# Patient Record
Sex: Male | Born: 1945 | Race: White | Hispanic: No | Smoking: Current every day smoker
Health system: Southern US, Community
[De-identification: ages and names within clinical notes are randomized; demographics above are authoritative.]

## PROBLEM LIST (undated history)

## (undated) DIAGNOSIS — J449 Chronic obstructive pulmonary disease, unspecified: Secondary | ICD-10-CM

## (undated) DIAGNOSIS — R55 Syncope and collapse: Secondary | ICD-10-CM

## (undated) DIAGNOSIS — I209 Angina pectoris, unspecified: Secondary | ICD-10-CM

## (undated) DIAGNOSIS — F4322 Adjustment disorder with anxiety: Secondary | ICD-10-CM

## (undated) DIAGNOSIS — G8929 Other chronic pain: Secondary | ICD-10-CM

## (undated) DIAGNOSIS — I5022 Chronic systolic (congestive) heart failure: Secondary | ICD-10-CM

## (undated) DIAGNOSIS — I739 Peripheral vascular disease, unspecified: Secondary | ICD-10-CM

## (undated) DIAGNOSIS — IMO0001 Reserved for inherently not codable concepts without codable children: Secondary | ICD-10-CM

## (undated) DIAGNOSIS — M1990 Unspecified osteoarthritis, unspecified site: Secondary | ICD-10-CM

## (undated) DIAGNOSIS — N400 Enlarged prostate without lower urinary tract symptoms: Secondary | ICD-10-CM

## (undated) DIAGNOSIS — I480 Paroxysmal atrial fibrillation: Secondary | ICD-10-CM

## (undated) DIAGNOSIS — C61 Malignant neoplasm of prostate: Secondary | ICD-10-CM

## (undated) DIAGNOSIS — N183 Chronic kidney disease, stage 3 unspecified: Secondary | ICD-10-CM

## (undated) DIAGNOSIS — F419 Anxiety disorder, unspecified: Secondary | ICD-10-CM

## (undated) DIAGNOSIS — I2510 Atherosclerotic heart disease of native coronary artery without angina pectoris: Secondary | ICD-10-CM

## (undated) DIAGNOSIS — R918 Other nonspecific abnormal finding of lung field: Secondary | ICD-10-CM

## (undated) DIAGNOSIS — I959 Hypotension, unspecified: Secondary | ICD-10-CM

## (undated) DIAGNOSIS — I10 Essential (primary) hypertension: Secondary | ICD-10-CM

## (undated) DIAGNOSIS — Z720 Tobacco use: Secondary | ICD-10-CM

## (undated) DIAGNOSIS — E785 Hyperlipidemia, unspecified: Secondary | ICD-10-CM

## (undated) DIAGNOSIS — C649 Malignant neoplasm of unspecified kidney, except renal pelvis: Secondary | ICD-10-CM

## (undated) DIAGNOSIS — I255 Ischemic cardiomyopathy: Secondary | ICD-10-CM

## (undated) DIAGNOSIS — Z8489 Family history of other specified conditions: Secondary | ICD-10-CM

## (undated) DIAGNOSIS — I00 Rheumatic fever without heart involvement: Secondary | ICD-10-CM

## (undated) HISTORY — DX: Essential (primary) hypertension: I10

## (undated) HISTORY — DX: Adjustment disorder with anxiety: F43.22

## (undated) HISTORY — PX: NEPHRECTOMY: SHX65

## (undated) HISTORY — PX: ROBOT ASSISTED LAPAROSCOPIC RADICAL PROSTATECTOMY: SHX5141

## (undated) HISTORY — PX: CORONARY ARTERY BYPASS GRAFT: SHX141

## (undated) HISTORY — DX: Hyperlipidemia, unspecified: E78.5

## (undated) HISTORY — PX: POSTERIOR CERVICAL LAMINECTOMY: SHX2248

## (undated) HISTORY — PX: OTHER SURGICAL HISTORY: SHX169

## (undated) HISTORY — PX: CARDIAC CATHETERIZATION: SHX172

## (undated) HISTORY — DX: Anxiety disorder, unspecified: F41.9

## (undated) HISTORY — DX: Chronic obstructive pulmonary disease, unspecified: J44.9

## (undated) HISTORY — DX: Atherosclerotic heart disease of native coronary artery without angina pectoris: I25.10

## (undated) HISTORY — DX: Benign prostatic hyperplasia without lower urinary tract symptoms: N40.0

---

## 1992-07-12 HISTORY — PX: KIDNEY SURGERY: SHX687

## 1997-12-21 ENCOUNTER — Inpatient Hospital Stay (HOSPITAL_COMMUNITY): Admission: EM | Admit: 1997-12-21 | Discharge: 1997-12-24 | Payer: Self-pay | Admitting: Emergency Medicine

## 1998-04-03 ENCOUNTER — Inpatient Hospital Stay (HOSPITAL_COMMUNITY): Admission: EM | Admit: 1998-04-03 | Discharge: 1998-04-05 | Payer: Self-pay | Admitting: Emergency Medicine

## 1998-04-15 ENCOUNTER — Inpatient Hospital Stay (HOSPITAL_COMMUNITY): Admission: EM | Admit: 1998-04-15 | Discharge: 1998-04-16 | Payer: Self-pay | Admitting: Emergency Medicine

## 1998-05-04 ENCOUNTER — Encounter: Payer: Self-pay | Admitting: Emergency Medicine

## 1998-05-04 ENCOUNTER — Inpatient Hospital Stay (HOSPITAL_COMMUNITY): Admission: EM | Admit: 1998-05-04 | Discharge: 1998-05-06 | Payer: Self-pay | Admitting: Emergency Medicine

## 1998-05-12 ENCOUNTER — Inpatient Hospital Stay (HOSPITAL_COMMUNITY): Admission: EM | Admit: 1998-05-12 | Discharge: 1998-05-14 | Payer: Self-pay | Admitting: Emergency Medicine

## 1998-09-19 ENCOUNTER — Encounter: Payer: Self-pay | Admitting: Emergency Medicine

## 1998-09-19 ENCOUNTER — Emergency Department (HOSPITAL_COMMUNITY): Admission: EM | Admit: 1998-09-19 | Discharge: 1998-09-19 | Payer: Self-pay | Admitting: Emergency Medicine

## 1999-02-24 ENCOUNTER — Encounter: Payer: Self-pay | Admitting: Neurosurgery

## 1999-02-26 ENCOUNTER — Inpatient Hospital Stay (HOSPITAL_COMMUNITY): Admission: RE | Admit: 1999-02-26 | Discharge: 1999-02-27 | Payer: Self-pay | Admitting: Neurosurgery

## 1999-02-26 ENCOUNTER — Encounter: Payer: Self-pay | Admitting: Neurosurgery

## 1999-04-29 ENCOUNTER — Encounter: Admission: RE | Admit: 1999-04-29 | Discharge: 1999-07-28 | Payer: Self-pay | Admitting: Neurosurgery

## 1999-06-26 ENCOUNTER — Encounter: Payer: Self-pay | Admitting: Neurosurgery

## 1999-06-26 ENCOUNTER — Encounter: Admission: RE | Admit: 1999-06-26 | Discharge: 1999-06-26 | Payer: Self-pay | Admitting: Neurosurgery

## 1999-08-17 ENCOUNTER — Encounter: Payer: Self-pay | Admitting: Neurosurgery

## 1999-08-17 ENCOUNTER — Ambulatory Visit (HOSPITAL_COMMUNITY): Admission: RE | Admit: 1999-08-17 | Discharge: 1999-08-17 | Payer: Self-pay | Admitting: Neurosurgery

## 1999-12-22 ENCOUNTER — Encounter: Payer: Self-pay | Admitting: Neurosurgery

## 1999-12-22 ENCOUNTER — Inpatient Hospital Stay (HOSPITAL_COMMUNITY): Admission: RE | Admit: 1999-12-22 | Discharge: 1999-12-23 | Payer: Self-pay | Admitting: Neurosurgery

## 2000-01-11 ENCOUNTER — Encounter: Payer: Self-pay | Admitting: Neurosurgery

## 2000-01-11 ENCOUNTER — Encounter: Admission: RE | Admit: 2000-01-11 | Discharge: 2000-01-11 | Payer: Self-pay | Admitting: Neurosurgery

## 2000-01-20 ENCOUNTER — Encounter: Payer: Self-pay | Admitting: Neurosurgery

## 2000-01-20 ENCOUNTER — Encounter: Admission: RE | Admit: 2000-01-20 | Discharge: 2000-01-20 | Payer: Self-pay | Admitting: Neurosurgery

## 2000-02-19 ENCOUNTER — Encounter: Admission: RE | Admit: 2000-02-19 | Discharge: 2000-02-19 | Payer: Self-pay | Admitting: Neurosurgery

## 2000-02-19 ENCOUNTER — Encounter: Payer: Self-pay | Admitting: Neurosurgery

## 2000-03-04 ENCOUNTER — Other Ambulatory Visit: Admission: RE | Admit: 2000-03-04 | Discharge: 2000-03-04 | Payer: Self-pay | Admitting: Orthopedic Surgery

## 2001-03-01 ENCOUNTER — Encounter: Payer: Self-pay | Admitting: Emergency Medicine

## 2001-03-01 ENCOUNTER — Inpatient Hospital Stay (HOSPITAL_COMMUNITY): Admission: EM | Admit: 2001-03-01 | Discharge: 2001-03-03 | Payer: Self-pay | Admitting: Emergency Medicine

## 2001-03-08 ENCOUNTER — Encounter: Admission: RE | Admit: 2001-03-08 | Discharge: 2001-03-11 | Payer: Self-pay | Admitting: Anesthesiology

## 2001-07-05 ENCOUNTER — Emergency Department (HOSPITAL_COMMUNITY): Admission: EM | Admit: 2001-07-05 | Discharge: 2001-07-05 | Payer: Self-pay | Admitting: Emergency Medicine

## 2003-03-29 ENCOUNTER — Encounter: Payer: Self-pay | Admitting: Emergency Medicine

## 2003-03-29 ENCOUNTER — Emergency Department (HOSPITAL_COMMUNITY): Admission: EM | Admit: 2003-03-29 | Discharge: 2003-03-29 | Payer: Self-pay | Admitting: Emergency Medicine

## 2005-01-06 ENCOUNTER — Inpatient Hospital Stay (HOSPITAL_COMMUNITY): Admission: EM | Admit: 2005-01-06 | Discharge: 2005-01-09 | Payer: Self-pay | Admitting: Emergency Medicine

## 2005-01-07 ENCOUNTER — Ambulatory Visit: Payer: Self-pay | Admitting: Cardiology

## 2005-01-07 ENCOUNTER — Encounter: Payer: Self-pay | Admitting: Cardiology

## 2005-01-10 ENCOUNTER — Emergency Department (HOSPITAL_COMMUNITY): Admission: EM | Admit: 2005-01-10 | Discharge: 2005-01-10 | Payer: Self-pay | Admitting: Emergency Medicine

## 2005-01-20 ENCOUNTER — Ambulatory Visit: Payer: Self-pay | Admitting: Cardiology

## 2007-02-24 ENCOUNTER — Emergency Department (HOSPITAL_COMMUNITY): Admission: EM | Admit: 2007-02-24 | Discharge: 2007-02-24 | Payer: Self-pay | Admitting: Emergency Medicine

## 2007-03-17 ENCOUNTER — Ambulatory Visit: Payer: Self-pay | Admitting: Cardiovascular Disease

## 2007-03-17 LAB — CONVERTED CEMR LAB
BUN: 17 mg/dL (ref 6–23)
Basophils Absolute: 0.1 10*3/uL (ref 0.0–0.1)
Basophils Relative: 0.5 % (ref 0.0–1.0)
CO2: 26 meq/L (ref 19–32)
Calcium: 9.4 mg/dL (ref 8.4–10.5)
Chloride: 104 meq/L (ref 96–112)
Eosinophils Absolute: 0.2 10*3/uL (ref 0.0–0.6)
GFR calc non Af Amer: 72 mL/min
Glucose, Bld: 104 mg/dL — ABNORMAL HIGH (ref 70–99)
Hemoglobin: 13.6 g/dL (ref 13.0–17.0)
MCV: 87.6 fL (ref 78.0–100.0)
Monocytes Relative: 7.6 % (ref 3.0–11.0)
Neutrophils Relative %: 68.9 % (ref 43.0–77.0)
Potassium: 3.6 meq/L (ref 3.5–5.1)
RDW: 13.5 % (ref 11.5–14.6)
Sodium: 139 meq/L (ref 135–145)
aPTT: 30.1 s — ABNORMAL HIGH (ref 21.7–29.8)

## 2007-03-23 ENCOUNTER — Inpatient Hospital Stay (HOSPITAL_BASED_OUTPATIENT_CLINIC_OR_DEPARTMENT_OTHER): Admission: RE | Admit: 2007-03-23 | Discharge: 2007-03-23 | Payer: Self-pay | Admitting: Cardiovascular Disease

## 2007-03-23 ENCOUNTER — Ambulatory Visit: Payer: Self-pay | Admitting: Cardiovascular Disease

## 2007-06-27 ENCOUNTER — Ambulatory Visit: Payer: Self-pay | Admitting: Cardiovascular Disease

## 2008-03-26 ENCOUNTER — Ambulatory Visit: Payer: Self-pay | Admitting: Cardiovascular Disease

## 2008-09-23 DIAGNOSIS — C61 Malignant neoplasm of prostate: Secondary | ICD-10-CM

## 2008-09-23 DIAGNOSIS — F17200 Nicotine dependence, unspecified, uncomplicated: Secondary | ICD-10-CM | POA: Insufficient documentation

## 2008-09-23 DIAGNOSIS — I10 Essential (primary) hypertension: Secondary | ICD-10-CM | POA: Insufficient documentation

## 2008-11-04 ENCOUNTER — Ambulatory Visit: Payer: Self-pay | Admitting: Cardiovascular Disease

## 2008-11-04 DIAGNOSIS — F4322 Adjustment disorder with anxiety: Secondary | ICD-10-CM

## 2008-11-11 ENCOUNTER — Telehealth (INDEPENDENT_AMBULATORY_CARE_PROVIDER_SITE_OTHER): Payer: Self-pay | Admitting: Radiology

## 2008-12-12 ENCOUNTER — Telehealth (INDEPENDENT_AMBULATORY_CARE_PROVIDER_SITE_OTHER): Payer: Self-pay

## 2008-12-16 ENCOUNTER — Encounter: Payer: Self-pay | Admitting: Cardiovascular Disease

## 2008-12-16 ENCOUNTER — Ambulatory Visit: Payer: Self-pay

## 2009-02-11 ENCOUNTER — Encounter: Admission: RE | Admit: 2009-02-11 | Discharge: 2009-02-11 | Payer: Self-pay | Admitting: Internal Medicine

## 2009-05-06 ENCOUNTER — Ambulatory Visit: Payer: Self-pay | Admitting: Cardiovascular Disease

## 2009-05-27 ENCOUNTER — Telehealth (INDEPENDENT_AMBULATORY_CARE_PROVIDER_SITE_OTHER): Payer: Self-pay | Admitting: Physician Assistant

## 2009-05-28 ENCOUNTER — Ambulatory Visit: Payer: Self-pay | Admitting: Internal Medicine

## 2009-05-28 ENCOUNTER — Inpatient Hospital Stay (HOSPITAL_COMMUNITY): Admission: EM | Admit: 2009-05-28 | Discharge: 2009-05-29 | Payer: Self-pay | Admitting: Emergency Medicine

## 2009-06-16 ENCOUNTER — Emergency Department (HOSPITAL_BASED_OUTPATIENT_CLINIC_OR_DEPARTMENT_OTHER): Admission: EM | Admit: 2009-06-16 | Discharge: 2009-06-16 | Payer: Self-pay | Admitting: Emergency Medicine

## 2009-06-26 ENCOUNTER — Encounter: Admission: RE | Admit: 2009-06-26 | Discharge: 2009-06-26 | Payer: Self-pay | Admitting: Internal Medicine

## 2009-09-11 ENCOUNTER — Encounter (INDEPENDENT_AMBULATORY_CARE_PROVIDER_SITE_OTHER): Payer: Self-pay | Admitting: *Deleted

## 2009-11-14 ENCOUNTER — Emergency Department (HOSPITAL_COMMUNITY): Admission: EM | Admit: 2009-11-14 | Discharge: 2009-11-14 | Payer: Self-pay | Admitting: Emergency Medicine

## 2009-11-18 ENCOUNTER — Telehealth: Payer: Self-pay | Admitting: Cardiovascular Disease

## 2009-11-18 ENCOUNTER — Emergency Department (HOSPITAL_COMMUNITY): Admission: EM | Admit: 2009-11-18 | Discharge: 2009-11-18 | Payer: Self-pay | Admitting: Emergency Medicine

## 2009-11-22 ENCOUNTER — Inpatient Hospital Stay (HOSPITAL_COMMUNITY): Admission: EM | Admit: 2009-11-22 | Discharge: 2009-11-23 | Payer: Self-pay | Admitting: Emergency Medicine

## 2009-11-22 ENCOUNTER — Ambulatory Visit: Payer: Self-pay | Admitting: Cardiology

## 2009-11-24 ENCOUNTER — Emergency Department (HOSPITAL_COMMUNITY): Admission: EM | Admit: 2009-11-24 | Discharge: 2009-11-24 | Payer: Self-pay | Admitting: Emergency Medicine

## 2009-12-03 ENCOUNTER — Ambulatory Visit: Payer: Self-pay | Admitting: Cardiovascular Disease

## 2009-12-03 ENCOUNTER — Encounter (INDEPENDENT_AMBULATORY_CARE_PROVIDER_SITE_OTHER): Payer: Self-pay | Admitting: *Deleted

## 2009-12-03 LAB — CONVERTED CEMR LAB
Basophils Absolute: 0 10*3/uL (ref 0.0–0.1)
Basophils Relative: 0.5 % (ref 0.0–3.0)
Eosinophils Absolute: 0.2 10*3/uL (ref 0.0–0.7)
Eosinophils Relative: 2.7 % (ref 0.0–5.0)
GFR calc non Af Amer: 62.28 mL/min (ref >60)
Lymphocytes Relative: 17.6 % (ref 12.0–46.0)
Lymphs Abs: 1.4 10*3/uL (ref 0.7–4.0)
MCHC: 34.3 g/dL (ref 30.0–36.0)
Monocytes Absolute: 0.6 10*3/uL (ref 0.1–1.0)
Monocytes Relative: 7.9 % (ref 3.0–12.0)
Neutro Abs: 5.7 10*3/uL (ref 1.4–7.7)
Platelets: 168 10*3/uL (ref 150.0–400.0)
RBC: 4.83 M/uL (ref 4.22–5.81)
Sodium: 141 meq/L (ref 135–145)
aPTT: 26.1 s (ref 21.7–28.8)

## 2009-12-05 ENCOUNTER — Ambulatory Visit: Payer: Self-pay | Admitting: Internal Medicine

## 2009-12-05 ENCOUNTER — Inpatient Hospital Stay (HOSPITAL_BASED_OUTPATIENT_CLINIC_OR_DEPARTMENT_OTHER): Admission: RE | Admit: 2009-12-05 | Discharge: 2009-12-05 | Payer: Self-pay | Admitting: Internal Medicine

## 2009-12-05 ENCOUNTER — Encounter: Payer: Self-pay | Admitting: Cardiovascular Disease

## 2009-12-13 ENCOUNTER — Emergency Department (HOSPITAL_COMMUNITY): Admission: EM | Admit: 2009-12-13 | Discharge: 2009-12-13 | Payer: Self-pay | Admitting: Emergency Medicine

## 2009-12-14 ENCOUNTER — Emergency Department (HOSPITAL_COMMUNITY): Admission: EM | Admit: 2009-12-14 | Discharge: 2009-12-14 | Payer: Self-pay | Admitting: Emergency Medicine

## 2009-12-15 ENCOUNTER — Emergency Department (HOSPITAL_COMMUNITY): Admission: EM | Admit: 2009-12-15 | Discharge: 2009-12-15 | Payer: Self-pay | Admitting: Emergency Medicine

## 2009-12-16 ENCOUNTER — Encounter: Payer: Self-pay | Admitting: Cardiovascular Disease

## 2009-12-31 ENCOUNTER — Encounter (INDEPENDENT_AMBULATORY_CARE_PROVIDER_SITE_OTHER): Payer: Self-pay | Admitting: *Deleted

## 2009-12-31 ENCOUNTER — Emergency Department (HOSPITAL_COMMUNITY): Admission: EM | Admit: 2009-12-31 | Discharge: 2009-12-31 | Payer: Self-pay | Admitting: Emergency Medicine

## 2010-01-13 ENCOUNTER — Encounter: Payer: Self-pay | Admitting: Cardiovascular Disease

## 2010-01-27 ENCOUNTER — Encounter: Payer: Self-pay | Admitting: Cardiovascular Disease

## 2010-01-28 ENCOUNTER — Telehealth: Payer: Self-pay | Admitting: Cardiovascular Disease

## 2010-02-12 ENCOUNTER — Ambulatory Visit (HOSPITAL_COMMUNITY): Admission: RE | Admit: 2010-02-12 | Discharge: 2010-02-12 | Payer: Self-pay | Admitting: Urology

## 2010-02-23 ENCOUNTER — Inpatient Hospital Stay (HOSPITAL_COMMUNITY): Admission: RE | Admit: 2010-02-23 | Discharge: 2010-02-25 | Payer: Self-pay | Admitting: Urology

## 2010-02-23 ENCOUNTER — Ambulatory Visit: Payer: Self-pay | Admitting: Cardiovascular Disease

## 2010-02-23 ENCOUNTER — Encounter: Payer: Self-pay | Admitting: Cardiovascular Disease

## 2010-02-23 ENCOUNTER — Encounter (INDEPENDENT_AMBULATORY_CARE_PROVIDER_SITE_OTHER): Payer: Self-pay | Admitting: Urology

## 2010-02-25 ENCOUNTER — Encounter: Payer: Self-pay | Admitting: Cardiovascular Disease

## 2010-03-03 ENCOUNTER — Encounter: Payer: Self-pay | Admitting: Cardiovascular Disease

## 2010-08-02 ENCOUNTER — Encounter: Payer: Self-pay | Admitting: Internal Medicine

## 2010-08-11 NOTE — Letter (Signed)
Summary: [facility] Office Note   [facility] Office Note   Imported By: Roderic Ovens 01/23/2010 12:57:39  _____________________________________________________________________  External Attachment:    Type:   Image     Comment:   External Document

## 2010-08-11 NOTE — Letter (Signed)
Summary: [facility] Office Visit Note   [facility] Office Visit Note   Imported By: Roderic Ovens 03/31/2010 15:55:43  _____________________________________________________________________  External Attachment:    Type:   Image     Comment:   External Document

## 2010-08-11 NOTE — Progress Notes (Signed)
Summary: chestpain , pressure  Phone Note Call from Patient Call back at Home Phone [phone]   Caller: Patient Reason for Call: Talk to Nurse Complaint: Chest Pain Details for Reason: c/o chestpain. pressure on chest. offer tomorrow appt at 4 p.m. pt refused. Initial call taken by: Lorne Skeens,  Nov 18, 2009 11:53 AM  Follow-up for Phone Call        spoke with pt, he states he developed chest pain on thursday while trying to have a bowel movement. he was straining very hard he says and his chest started hurting and he broke out in a sweat. he is still having trouble with constipation and has a call into dr tannenbaum. he cont to have a heaviness in his chest that occurs when he strains or with exerction. he is not able to reproduce the pain with movement or touch. he also c/o SOB with exerction. he states this heaviness in his chest is simular to what he had with his previous heart attack.pt also reports legs being weak and just not feeling well. pt referred [facility]. pt voiced understanding.   Follow-up by: Deliah Goody, RN,  Nov 18, 2009 12:22 PM

## 2010-08-11 NOTE — Assessment & Plan Note (Signed)
Summary: rov/ gd   Visit Type:  Follow-up Primary Provider:  DR Hayden Rasmussen  CC:  chest pain.  History of Present Illness: Lucas [name] is seen today in followup.  He has a history coronary bypass surgery.  Catheter in 2008 showed occluded vein graft to the first acute marginal branch and diagonal branch as well as the right.  The vein graft to OM2 and LIMA were patent.  He has collaterals to the distal right.  He continues to smoke too much. He has clinical  COPD but does no want to have an inhaler. He was recently hospitalized for prostate problems and required foley catherter.  He had some SSCP and R/O.  Given his smoking and known graft failure and likely need for prostate surgery I think he needs a diagnostic cath to risk stratify him  Lucas [name] has an anxiety disorder and the inability to urinate really gets him going.  His foley may be D/C next Wendsday so I would like him to have his cath before then.  The risks are well known to Lucas [name] and he is willing to proceed  He will need Versed on the table for sedation.  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 1 Tab Daily 2)  Lisinopril 20 Mg Tabs (Lisinopril) .... Take One Tablet By Mouth Daily 3)  Alprazolam 1 Mg Tabs (Alprazolam) .Marland Kitchen.. 1 Tab Three Times Day 4)  Hydrocodone-Acetaminophen 10-325 Mg Tabs (Hydrocodone-Acetaminophen) .... As Needed 5)  Nitroglycerin 0.4 Mg Subl (Nitroglycerin) .... One Tablet Under Tongue Every 5 Minutes As Needed For Chest Pain---May Repeat Times Three  Allergies (verified): No Known Drug Allergies  Past History:  Past Medical History: Last updated: 10/28/2008 CAD/CABG:  last cath 03/2007 SVG OM2 and Lima patent with collaterilized RCA  Medical therapy  Hypertension Anxiety kidney cancer hx of prostrate ca  Past Surgical History: Last updated: 10/28/2008 previous coronary artery bypass surgery.  prostatic  hypertrophy  heart cath 9.11.08  Family History: Last updated: 09/23/2008 Noncontributory.  Social History: Last updated: 11/04/2008 Married  wife now in nursing home [name] Tobacco Use - Yes.  Alcohol Use - no Drug Use - no  Review of Systems       Denies fever, malais, weight loss, blurry vision, decreased visual acuity, cough, sputum, SOB, hemoptysis, pleuritic pain, palpitaitons, heartburn, abdominal pain, melena, lower extremity edema, claudication, or rash.   Vital Signs:  Patient profile:   65 year old male Height:      70 inches Weight:      193 pounds BMI:     27.79 Pulse rate:   60 / minute BP sitting:   136 / 73  (left arm)  Vitals Entered By: Kem Parkinson (Dec 03, 2009 10:17 AM)  Physical Exam  General:  Affect appropriate Healthy:  appears stated age HEENT: normal Neck supple with no adenopathy JVP normal no bruits no thyromegaly Lungs clear with no wheezing and good diaphragmatic motion Heart:  S1/S2 no murmur,rub, gallop or click PMI normal Abdomen: benighn, BS positve, no tenderness, no AAA no bruit.  No HSM or HJR Distal pulses intact with no bruits No edema Neuro non-focal Skin warm and dry    Impression & Recommendations:  Problem # 1:  CORONARY ARTERY DISEASE (ICD-414.00)  His updated  medication list for this problem includes:    Lisinopril 20 Mg Tabs (Lisinopril) .Marland Kitchen... Take one tablet by mouth daily    Nitroglycerin 0.4 Mg Subl (Nitroglycerin) ..... One tablet under tongue every 5 minutes as needed for chest pain---may repeat times three  Orders: EKG w/ Interpretation (93000) TLB-BMP (Basic Metabolic Panel-BMET) (80048-METABOL) TLB-CBC Platelet - w/Differential (85025-CBCD) TLB-PTT (85730-PTTL) TLB-PT (Protime) (85610-PTP)  Problem # 2:  HYPERTENSION  (ICD-401.9)  Well controlled  His updated medication list for this problem includes:    Cardura 8 Mg Tabs (Doxazosin mesylate) .Marland Kitchen... 1 tab daily    Lisinopril 20 Mg Tabs (Lisinopril) .Marland Kitchen... Take one tablet by mouth daily  Orders: EKG w/ Interpretation (93000)  His updated medication list for this problem includes:    Cardura 8 Mg Tabs (Doxazosin mesylate) .Marland Kitchen... 1 tab daily    Lisinopril 20 Mg Tabs (Lisinopril) .Marland Kitchen... Take one tablet by mouth daily  Problem # 3:  SMOKER (ICD-305.1) Indicates he has stopped for the last 3 months.  Long term risks of vascular disease and further graft failure discussed  Patient Instructions: 1)  Your physician recommends that you schedule a follow-up appointment after cardiac cath 2)  Your physician recommends that you continue on your current medications as directed. Please refer to the Current Medication list given to you today. 3)  Your physician has requested that you have a cardiac catheterization.  Cardiac catheterization is used to diagnose and/or treat various heart conditions. Doctors may recommend this procedure for a number of different reasons. The most common reason is to evaluate chest pain. Chest pain can be a symptom of coronary artery disease (CAD), and cardiac catheterization can show whether plaque is narrowing or blocking your heart's arteries. This procedure is also used to evaluate the valves, as well as measure the blood flow and oxygen levels in different parts of your heart.  For further information please visit [email or web address]  Please follow instruction sheet, as given.

## 2010-08-11 NOTE — Letter (Signed)
Summary: Appointment - Reminder 2  [facility], Main Office  [address]   [place], [place] [address]   Phone: [phone]  Fax: [phone]     September 11, 2009 MRN: [id number]   Lucas [name] [address] [place], [place]  [address]   Dear [name],  Our records indicate that it is time to schedule a follow-up appointment with Dr. Eden Emms. It is very important that we reach you to schedule this appointment. We look forward to participating in your health care needs. Please contact us at the number listed above at your earliest convenience to schedule your appointment.  If you are unable to make an appointment at this time, give [place] a call so we can update our records.     Sincerely,   Migdalia Dk [facility] Scheduling Team

## 2010-08-11 NOTE — Progress Notes (Signed)
[facility]   Imported By: Erle Crocker 12/25/2009 07:58:50  _____________________________________________________________________  External Attachment:    Type:   Image     Comment:   External Document

## 2010-08-11 NOTE — Letter (Signed)
[facility]   Imported By: Roderic Ovens 03/13/2010 15:58:59  _____________________________________________________________________  External Attachment:    Type:   Image     Comment:   External Document

## 2010-08-11 NOTE — Cardiovascular Report (Signed)
Summary: Pre Cath Orders   Pre Cath Orders   Imported By: Roderic Ovens 12/30/2009 14:56:55  _____________________________________________________________________  External Attachment:    Type:   Image     Comment:   External Document

## 2010-08-11 NOTE — Progress Notes (Signed)
Summary: clearance  Phone Note From Other Clinic   Caller: nurse Celeta Summary of Call: Pt needs prostate cancer surgery on August 4th. is pt ok to have this from a cardiac standpoint? Per Celeta ofc [id number] [phone] fax [phone] Initial call taken by: Edman Circle,  January 28, 2010 10:34 AM  Follow-up for Phone Call        had cath in May 2011, needs TURP, notes from urolgy are scanned into EMR, will send to Dr Eden Emms for review Meredith Staggers, RN  January 28, 2010 10:44 AM   Additional Follow-up for Phone Call Additional follow up Details #1::        Ok for surgery  No revascularization to be done by recent cath.  We should follow in hospital post op Additional Follow-up by: Colon Branch, MD, [facility],  January 30, 2010 1:03 PM    Additional Follow-up for Phone Call Additional follow up Details #2::    I will fax statement of clearance. Follow-up by: Sherri Rad, RN, BSN,  January 30, 2010 1:35 PM   Appended Document: clearance faxed to [facility]- [phone].

## 2010-08-11 NOTE — Letter (Signed)
Summary: Cardiac Catheterization Instructions- [facility]  [facility], Main Office  [address]   [place], [place] [address]   Phone: [phone]  Fax: [phone]     12/03/2009 MRN: [id number]  Lucas [name] [address] [place], [place]  [address]  Dear Lucas [name],   You are scheduled for a Cardiac Catheterization on Firday Dec 05, 2009 with Dr.Bensimhon  Please arrive to the 1st floor of the Heart and Vascular Center at [facility] at _____ on the day of your procedure. Please do not arrive before 6:30 a.m. Call the Heart and Vascular Center at [phone] if you are unable to make your appointmnet. The Code to get into the parking garage under the building is 0010. Take the elevators to the 1st floor. You must have someone to drive you home. Someone must be with you for the first 24 hours after you arrive home. Please wear clothes that are easy to get on and off and wear slip-on shoes. Do not eat or drink after midnight except water with your medications that morning. Bring all your medications and current insurance cards with you.  ___ DO NOT take these medications before your procedure: ________________________________________________________________  ___ Make sure you take your aspirin.  ___ You may take ALL of your medications with water that morning. ________________________________________________________________________________________________________________________________  ___ DO NOT take ANY medications before your procedure.  ___ Pre-med instructions:  ________________________________________________________________________________________________________________________________  The usual length of stay after your procedure is 2 to 3 hours. This can vary.  If you have any questions, please call the office at the number listed above.   Charolotte Capuchin, RN  Appended Document: Cardiac Catheterization Instructions- [facility]  Lab pt should be there at 7:30am for an 8:30 am case.  He is aware of the time.

## 2010-08-11 NOTE — Cardiovascular Report (Signed)
Summary: Pre Cath Orders  Pre Cath Orders   Imported By: Roderic Ovens 12/09/2009 14:20:02  _____________________________________________________________________  External Attachment:    Type:   Image     Comment:   External Document

## 2010-08-11 NOTE — Letter (Signed)
Summary: Appointment - Missed  [facility], Main Office  [address]   [place], [place] [address]   Phone: [phone]  Fax: [phone]     December 31, 2009 MRN: [id number]   Lucas [name] [address] [place], [place]  [address]   Dear Lucas [name],  Our records indicate you missed your appointment on  12/24/2009 with Dr.  Eden Emms. It is very important that we reach you to reschedule this appointment. We look forward to participating in your health care needs. Please contact us at the number listed above at your earliest convenience to reschedule this appointment.     Sincerely,   Migdalia Dk [facility] Scheduling Team

## 2010-08-14 NOTE — Letter (Signed)
Summary: [facility]  [facility]   Imported By: Marylou Mccoy 02/11/2010 08:58:29  _____________________________________________________________________  External Attachment:    Type:   Image     Comment:   External Document

## 2010-08-14 NOTE — Consult Note (Signed)
[facility]   Imported By: Roderic Ovens 03/13/2010 15:55:22  _____________________________________________________________________  External Attachment:    Type:   Image     Comment:   External Document

## 2010-09-24 LAB — ABO/RH: ABO/RH(D): A POS

## 2010-09-24 LAB — BASIC METABOLIC PANEL
CO2: 26 mEq/L (ref 19–32)
Creatinine, Ser: 1.08 mg/dL (ref 0.4–1.5)
GFR calc Af Amer: >60 mL/min (ref >60)

## 2010-09-24 LAB — HEMATOCRIT: HCT: 37.7 % — ABNORMAL LOW (ref 39.0–52.0)

## 2010-09-24 LAB — HEMOGLOBIN AND HEMATOCRIT, BLOOD: HCT: 38.9 % — ABNORMAL LOW (ref 39.0–52.0)

## 2010-09-25 LAB — BASIC METABOLIC PANEL
CO2: 24 mEq/L (ref 19–32)
Calcium: 9.2 mg/dL (ref 8.4–10.5)
Chloride: 109 mEq/L (ref 96–112)
GFR calc non Af Amer: >60 mL/min (ref >60)

## 2010-09-25 LAB — CBC
HCT: 43.9 % (ref 39.0–52.0)
Hemoglobin: 15.2 g/dL (ref 13.0–17.0)
MCH: 31.6 pg (ref 26.0–34.0)
MCHC: 34.6 g/dL (ref 30.0–36.0)
MCV: 91.5 fL (ref 78.0–100.0)
Platelets: 179 10*3/uL (ref 150–400)
WBC: 8.9 10*3/uL (ref 4.0–10.5)

## 2010-09-25 LAB — SURGICAL PCR SCREEN: Staphylococcus aureus: NEGATIVE

## 2010-09-27 LAB — URINE MICROSCOPIC-ADD ON

## 2010-09-27 LAB — POCT I-STAT, CHEM 8
BUN: 19 mg/dL (ref 6–23)
Calcium, Ion: 1.11 mmol/L — ABNORMAL LOW (ref 1.12–1.32)
Glucose, Bld: 100 mg/dL — ABNORMAL HIGH (ref 70–99)
HCT: 42 % (ref 39.0–52.0)

## 2010-09-27 LAB — URINALYSIS, ROUTINE W REFLEX MICROSCOPIC: Bilirubin Urine: NEGATIVE

## 2010-09-27 LAB — URINE CULTURE: Colony Count: >=100000

## 2010-09-28 LAB — URINALYSIS, ROUTINE W REFLEX MICROSCOPIC
Bilirubin Urine: NEGATIVE
Bilirubin Urine: NEGATIVE
Bilirubin Urine: NEGATIVE
Glucose, UA: NEGATIVE mg/dL
Glucose, UA: NEGATIVE mg/dL
Hgb urine dipstick: NEGATIVE
Ketones, ur: NEGATIVE mg/dL
Ketones, ur: NEGATIVE mg/dL
Ketones, ur: NEGATIVE mg/dL
Leukocytes, UA: NEGATIVE
Protein, ur: NEGATIVE mg/dL
Specific Gravity, Urine: 1.017 (ref 1.005–1.030)
Specific Gravity, Urine: 1.02 (ref 1.005–1.030)
Urobilinogen, UA: 0.2 mg/dL (ref 0.0–1.0)
Urobilinogen, UA: 0.2 mg/dL (ref 0.0–1.0)
pH: 6.5 (ref 5.0–8.0)
pH: 6.5 (ref 5.0–8.0)

## 2010-09-28 LAB — URINE CULTURE: Colony Count: NO GROWTH

## 2010-09-28 LAB — LIPID PANEL
Cholesterol: 177 mg/dL (ref 0–200)
Total CHOL/HDL Ratio: 4.9 RATIO

## 2010-09-28 LAB — HEMOGLOBIN A1C
Hgb A1c MFr Bld: 5.9 % — ABNORMAL HIGH (ref <5.7)
Mean Plasma Glucose: 123 mg/dL — ABNORMAL HIGH (ref <117)

## 2010-09-28 LAB — POCT I-STAT, CHEM 8
BUN: 27 mg/dL — ABNORMAL HIGH (ref 6–23)
Chloride: 108 mEq/L (ref 96–112)
Glucose, Bld: 104 mg/dL — ABNORMAL HIGH (ref 70–99)
HCT: 44 % (ref 39.0–52.0)
Potassium: 4 mEq/L (ref 3.5–5.1)
Sodium: 139 mEq/L (ref 135–145)

## 2010-09-28 LAB — CARDIAC PANEL(CRET KIN+CKTOT+MB+TROPI)
CK, MB: 1.6 ng/mL (ref 0.3–4.0)
Troponin I: 0.04 ng/mL (ref 0.00–0.06)

## 2010-09-28 LAB — CBC
Hemoglobin: 14 g/dL (ref 13.0–17.0)
MCHC: 33.5 g/dL (ref 30.0–36.0)
RBC: 4.51 MIL/uL (ref 4.22–5.81)

## 2010-09-28 LAB — TSH: TSH: 1.347 u[IU]/mL (ref 0.350–4.500)

## 2010-09-28 LAB — URINE MICROSCOPIC-ADD ON

## 2010-09-29 LAB — CBC
HCT: 44.5 % (ref 39.0–52.0)
Hemoglobin: 15.2 g/dL (ref 13.0–17.0)
MCHC: 33.6 g/dL (ref 30.0–36.0)
MCV: 93.3 fL (ref 78.0–100.0)
Platelets: 157 10*3/uL (ref 150–400)
RDW: 13.6 % (ref 11.5–15.5)
RDW: 13.9 % (ref 11.5–15.5)
WBC: 7.8 10*3/uL (ref 4.0–10.5)

## 2010-09-29 LAB — URINE CULTURE
Colony Count: NO GROWTH
Colony Count: NO GROWTH
Culture: NO GROWTH

## 2010-09-29 LAB — COMPREHENSIVE METABOLIC PANEL
ALT: 15 U/L (ref 0–53)
AST: 19 U/L (ref 0–37)
Albumin: 4 g/dL (ref 3.5–5.2)
Alkaline Phosphatase: 59 U/L (ref 39–117)
Chloride: 107 mEq/L (ref 96–112)
GFR calc Af Amer: >60 mL/min (ref >60)
Potassium: 3.8 mEq/L (ref 3.5–5.1)
Sodium: 139 mEq/L (ref 135–145)
Total Bilirubin: 0.8 mg/dL (ref 0.3–1.2)
Total Protein: 7.6 g/dL (ref 6.0–8.3)

## 2010-09-29 LAB — POCT CARDIAC MARKERS
CKMB, poc: 1.1 ng/mL (ref 1.0–8.0)
CKMB, poc: <1 ng/mL — ABNORMAL LOW (ref 1.0–8.0)
Myoglobin, poc: 95 ng/mL (ref 12–200)
Myoglobin, poc: 99.5 ng/mL (ref 12–200)
Troponin i, poc: <0.05 ng/mL (ref 0.00–0.09)
Troponin i, poc: <0.05 ng/mL (ref 0.00–0.09)

## 2010-09-29 LAB — DIFFERENTIAL
Basophils Absolute: 0 10*3/uL (ref 0.0–0.1)
Basophils Relative: 0 % (ref 0–1)
Eosinophils Relative: 3 % (ref 0–5)
Monocytes Absolute: 0.7 10*3/uL (ref 0.1–1.0)
Monocytes Relative: 9 % (ref 3–12)

## 2010-09-29 LAB — URINE MICROSCOPIC-ADD ON: Urine-Other: NONE SEEN

## 2010-09-29 LAB — URINALYSIS, ROUTINE W REFLEX MICROSCOPIC
Bilirubin Urine: NEGATIVE
Glucose, UA: NEGATIVE mg/dL
Glucose, UA: NEGATIVE mg/dL
Ketones, ur: NEGATIVE mg/dL
Ketones, ur: NEGATIVE mg/dL
Leukocytes, UA: NEGATIVE
Protein, ur: NEGATIVE mg/dL
Urobilinogen, UA: 1 mg/dL (ref 0.0–1.0)
pH: 5.5 (ref 5.0–8.0)
pH: 7 (ref 5.0–8.0)

## 2010-09-29 LAB — PROTIME-INR: INR: 1.3 (ref 0.00–1.49)

## 2010-09-29 LAB — BASIC METABOLIC PANEL
CO2: 27 mEq/L (ref 19–32)
Calcium: 9.3 mg/dL (ref 8.4–10.5)
Creatinine, Ser: 1.13 mg/dL (ref 0.4–1.5)
Glucose, Bld: 111 mg/dL — ABNORMAL HIGH (ref 70–99)
Sodium: 139 mEq/L (ref 135–145)

## 2010-09-29 LAB — CARDIAC PANEL(CRET KIN+CKTOT+MB+TROPI)
CK, MB: 1.9 ng/mL (ref 0.3–4.0)
Relative Index: 1.8 (ref 0.0–2.5)
Troponin I: 0.05 ng/mL (ref 0.00–0.06)

## 2010-09-29 LAB — APTT: aPTT: 32 s (ref 24–37)

## 2010-09-29 LAB — CK TOTAL AND CKMB (NOT AT ARMC)
CK, MB: 1.8 ng/mL (ref 0.3–4.0)
Relative Index: 1.5 (ref 0.0–2.5)
Total CK: 119 U/L (ref 7–232)

## 2010-09-29 LAB — TROPONIN I

## 2010-10-13 LAB — URINALYSIS, ROUTINE W REFLEX MICROSCOPIC
Bilirubin Urine: NEGATIVE
Glucose, UA: NEGATIVE mg/dL
Hgb urine dipstick: NEGATIVE
Protein, ur: NEGATIVE mg/dL
Urobilinogen, UA: 0.2 mg/dL (ref 0.0–1.0)

## 2010-10-14 LAB — PROTIME-INR: INR: 1.3 (ref 0.00–1.49)

## 2010-10-14 LAB — DIFFERENTIAL
Basophils Absolute: 0 10*3/uL (ref 0.0–0.1)
Eosinophils Relative: 2 % (ref 0–5)
Lymphocytes Relative: 21 % (ref 12–46)
Lymphs Abs: 2.1 10*3/uL (ref 0.7–4.0)
Monocytes Absolute: 0.9 10*3/uL (ref 0.1–1.0)
Monocytes Relative: 9 % (ref 3–12)
Neutro Abs: 6.7 10*3/uL (ref 1.7–7.7)
Neutrophils Relative %: 68 % (ref 43–77)

## 2010-10-14 LAB — POCT CARDIAC MARKERS
CKMB, poc: 1.2 ng/mL (ref 1.0–8.0)
CKMB, poc: <1 ng/mL — ABNORMAL LOW (ref 1.0–8.0)
Myoglobin, poc: 103 ng/mL (ref 12–200)
Myoglobin, poc: 87.7 ng/mL (ref 12–200)
Troponin i, poc: <0.05 ng/mL (ref 0.00–0.09)

## 2010-10-14 LAB — POCT I-STAT, CHEM 8
BUN: 17 mg/dL (ref 6–23)
Chloride: 105 mEq/L (ref 96–112)
Creatinine, Ser: 0.9 mg/dL (ref 0.4–1.5)
Glucose, Bld: 102 mg/dL — ABNORMAL HIGH (ref 70–99)
Hemoglobin: 15.3 g/dL (ref 13.0–17.0)
Potassium: 3.4 mEq/L — ABNORMAL LOW (ref 3.5–5.1)
Sodium: 141 mEq/L (ref 135–145)
TCO2: 24 mmol/L (ref 0–100)

## 2010-10-14 LAB — CBC
HCT: 40.6 % (ref 39.0–52.0)
Hemoglobin: 14.6 g/dL (ref 13.0–17.0)
Platelets: 166 10*3/uL (ref 150–400)
RBC: 4.63 MIL/uL (ref 4.22–5.81)
RDW: 14 % (ref 11.5–15.5)
WBC: 9.9 10*3/uL (ref 4.0–10.5)

## 2010-10-14 LAB — TSH: TSH: 1.203 u[IU]/mL (ref 0.350–4.500)

## 2010-10-14 LAB — CARDIAC PANEL(CRET KIN+CKTOT+MB+TROPI)
CK, MB: 1.3 ng/mL (ref 0.3–4.0)
Relative Index: 0.9 (ref 0.0–2.5)
Total CK: 141 U/L (ref 7–232)
Troponin I: 0.04 ng/mL (ref 0.00–0.06)

## 2010-10-14 LAB — CK TOTAL AND CKMB (NOT AT ARMC): Relative Index: 1 (ref 0.0–2.5)

## 2010-10-14 LAB — APTT: aPTT: 30 seconds (ref 24–37)

## 2010-11-24 NOTE — Cardiovascular Report (Signed)
NAME:  Lucas [name], Lucas [name] NO.:  [id number]   MEDICAL RECORD NO.:  [id number]          PATIENT TYPE:  OIB   LOCATION:  1963                         FACILITY:  [facility]   PHYSICIAN:  Noralyn Pick. Eden Emms, MD, FACCDATE OF BIRTH:  December 31, 1945   DATE OF PROCEDURE:  DATE OF DISCHARGE:                            CARDIAC CATHETERIZATION   PROCEDURE:  Coronary arteriography.   INDICATIONS:  Chest pain, angina, history of coronary artery bypass  surgery.   Cine catheterization was done with 4-French catheters from right femoral  artery.  The saphenous vein bypass grafts were engaged using a Williams  right catheter and a native JR-4.  The internal mammary artery was  cannulated with a LIMA catheter.   The left main coronary artery was diffusely diseased 70-80%.  The left  anterior descending artery was 100% occluded proximally.  The circumflex  coronary artery was 100% occluded distally.  There are three small  obtuse marginal branches.  They were all less than 1.5-mm vessels and  diffusely diseased.  The second obtuse marginal branch had an 80% ostial  lesion, but the vessel really was too small for angioplasty.  The distal  AV groove circumflex was subtotally occluded with bridging collaterals.   The right coronary artery was 100% occluded proximally.  There was a  large conus branch which gave some bridging collaterals, but the distal  right never filled.   The patient had four saphenous vein grafts.  The saphenous vein graft to  the first obtuse marginal branch, first diagonal branch, and right  coronary artery were all 100% occluded.  This was known from previous  catheterization as well.   The saphenous vein graft to the second obtuse marginal branch had a 30%  tubular lesion proximally.  Otherwise, it was free of disease.   The left internal mammary artery was widely patent to the mid to distal  LAD.  The distal LAD had 30-40% multiple discrete lesions.  The LIMA  filled the LAD retrograde as well as well as the first diagonal branch.  It did not fill the circumflex, and there were some septal collaterals  as well as collaterals from the distal LAD to the PDA and distal right  coronary artery.   RAO VENTRICULOGRAPHY:  RAO ventriculography showed apical hypokinesis.  Overall ejection fraction was 50-55%, LV pressure is 125/4, aortic  pressure is 120/71.   IMPRESSION:  The patient's angiogram is fairly unremarkable compared to  his previous catheterization.  The small marginal branches are not  really revascularizable due to their small size and diffuse disease.  The patient will have continued medical therapy.   He tolerated the procedure well.  Note should be made that [name] has a  significant anxiety disorder.  He required approximately 150 mcg of  fentanyl and 5 mg of Versed to get him through the catheterization  without difficulty.      Noralyn Pick. Eden Emms, MD, [facility]  Electronically Signed     PCN/MEDQ  D:  03/23/2007  T:  03/23/2007  Job:  [id number]

## 2010-11-24 NOTE — Assessment & Plan Note (Signed)
[facility]                            CARDIOLOGY OFFICE NOTE   Lucas [name], Lucas [name]                     MRN:          [id number]  DATE:03/17/2007                            DOB:          10-15-1945    Page is a patient of Dr. Barbee Shropshire who is referred for chest pain.   Date of birth 26-Jan-1946.   Argil was a previous patient of mine who I have not seen since 2006.  [place] brother who had what  sounds like an abdominal infection requiring colostomy and prolonged  hospitalization.   The patient has had known coronary artery disease with previous coronary  artery bypass surgery.   His bypass initially was 1998, his last heart catheterization I believe  was by Dr. Antoine Poche in June of 2006.  At the time the LIMA was patent,  the OM2 graft was patent but the OM1 and RCA graft was occluded and  medical therapy was warranted.   Since that time Lorenza  has been somewhat anxious recently.  Apparently, he has been having some prostate problems.  As I recall,  from his previous catheterization, he is prone to urinary retention and  prostatism.  He has been on Cardura for this.  He has had increasing  pain.  He has seen Dr. Patsi Sears who is to schedule a prostate biopsy.  However, Roberth has been very anxious about this.  His initial  procedure, which sounded like an ultrasound probe, was extremely painful  and Ahron does not understand why he could not have been better  sedated or given pain medicine.  This has precipitated some increasing  blood pressure and chest pain.  The chest pain does sound like angina,  it is related to stress and activity, it has been recurrent, it has  occurred over the last 2 weeks in particular.  He has had some relief  with Xanax, but every time he gets excited about what may happen with  his prostate he gets central chest pressure that radiates to his left  arm, there  is no associated diaphoresis or shortness of breath.   His coronary risk factors include hyperlipidemia, hypertension and he  continues to smoke unfortunately.   The patient has not had any rest pain.  He has not had prolonged  episodes of pain, he has not taken nitroglycerin for it.  When he takes  his Xanax or when he is not stressed about his prostate problems, his  pain goes away.   REVIEW OF SYSTEMS:  As indicated, is remarkable for prostatism with need  for recent biopsy.  He is apparently supposed to have a preparation and  take Cipro prior to the procedure.  I told him we would have to cancel  this or postpone it until we figured out what was going on with his  heart.   FAMILY HISTORY:  Noncontributory.   He has no known allergy.   MEDICATIONS:  1. Lisinopril/hydrochlorothiazide 10/12.5 which was just given to him      by Dr. Barbee Shropshire.  2. An aspirin a day.  3. Cardura 8 mg a day.  4. Tenormin 25 a day.  5. Xanax p.r.n.  He is not on Avodart and I am not sure why.   He is happily married.  His wife is with him.  He is retired.  He still  smokes less than 1/2 pack a day, does not drink.  [place].   EXAM:  Remarkable for an elderly white male with a tan and tattoos, in  no distress.  He has a bit of COPD clinically.  Weight is 203,  respiratory rate is 16, blood pressures 150/85, pulse 68 and regular, he  is afebrile.  HEENT:  Normal.  Carotids normal without bruit.  There is no  lymphadenopathy, no thyromegaly, no JVP elevation.  LUNGS:  Clear with good diaphragmatic motion, no active wheezing, S1 S2.  Distant heart sounds.  PMI is normal.  ABDOMEN:  Protuberant.  Bowel sounds positive.  No hepatosplenomegaly,  no hepatojugular reflux, no AAA, no bruits, no tenderness.  Femorals are +3 bilaterally without bruit, PTs are +3.  There is no  lower extremity edema.  NEURO:  Nonfocal.  Skin is  warm and dry.  There is no muscular weakness.   IMPRESSION:  1. History of coronary artery disease back in 1998, known failed 2 out      of 3 vein grafts.  Chest pain precipitated by anxiety.  The patient      will be referred for heart catheterization next week.  He will      continue his current medications.  He knows to take nitroglycerin      for any prolonged episodes of pain.  2. Hypertension.  ACE inhibitor with diuretic just started.  I think      this is a good addition to his Tenormin.  We will follow his blood      pressure.  I suspect he will need to have escalating doses of his      lisinopril.  He will continue his low salt diet.  3. Prostatism.  We will call Dr. Imelda Pillow office and postpone his      procedure.  We will have to be careful about urinary retention      during his heart catheterization, since this was a problem last      time.  It may be worthwhile to add Avodart to his Cardura.  We will      try to get PSA levels from Dr. Imelda Pillow office, but clearly his      heart needs to be clarified before he has any procedures.  4. Anxiety.  He really needs to talk to Dr. Patsi Sears and get a      better flavor for what is going on with his prostate and clearly      needs better sedation and pain control during any procedures.  He      has plenty of Xanax at home to take for this.  5. Smoking.  Patient may be a good candidate for Zyban.  We encouraged      him to stop smoking given his known coronary disease and failed      grafts.  He will see what he can do,      but right now he particularly wants to smoke during his anxiety      about his prostate procedure.     Theron Arista  Phillips Hay, MD, [facility]  Electronically Signed    PCN/MedQ  DD: 03/17/2007  DT: 03/17/2007  Job #: [id number]   cc:   Lynelle Smoke I. Patsi Sears, M.D.

## 2010-11-24 NOTE — Assessment & Plan Note (Signed)
[facility]                            CARDIOLOGY OFFICE NOTE   CREGG, JUTTE                     MRN:          [id number]  DATE:06/27/2007                            DOB:          05-20-1946    Jerrid returns today for followup. He is status post previous CABG with  failed bypass grafts. His last cath was in September of this year.   At that time, the vein graft to the obtuse marginal, high diagonal and  right were 100% occluded. Vein graft to the second obtuse marginal  branch was patent with 30% disease. The LIMA to the LAD was patent and  filled the diagonal branch retrograde. Medical therapy was warranted.  His EF is 50 to 55%. He is not smoking. He is chewing Nicorette gum. He  has not had any significant chest pain, PND or orthopnea. He continues  to have a significant anxiety disorder.   The patient has been compliant with his medications.   REVIEW OF SYSTEMS:  Is remarkable for improved prostatism. However, he  continues to have an elevated PSA and needs further followup with Dr.  Patsi Sears. 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   CURRENT MEDICATIONS:  1. Aspirin a day.  2. Cardura 8 mg a day.  3. Tenormin 25 a day.  4. Xanax.  5. Lisinopril 10 mg a day.  6. He is no longer on Plavix.   PHYSICAL EXAMINATION:  Is remarkable for an anxious white male in no  distress. Weight 210, blood pressure 140/80, pulse 70 and regular.  Respiratory rate is 16, afebrile.  HEENT: Is unremarkable. Carotids are normal without bruits. There is no  lymphadenopathy, thyromegaly or JVP elevation.  LUNGS:  Are clear with good diaphragmatic motion and rhonchi. He has  COPD with mild end-expiratory wheezes.  There is an S1, S2 with normal heart sounds. PMI normal.  ABDOMEN: Is benign. Bowel sounds positive. No hepatosplenomegaly. No  hepatojugular reflux. No tenderness.  Distal pulses are intact with no edema.  NEURO: Is nonfocal. No muscular weakness.  SKIN: Is warm and dry.   IMPRESSION:  1. Coronary disease 3/4 vein grafts failed. Patent vein graft to the      obtuse marginal and Left internal mammary artery.  Good left      ventricular function. No angina. Continue aspirin and beta-blocker.  2. Hypertension, currently well-controlled. Continue current dosages.      Improvement with lisinopril 10 mg and low salt diet.  3. Prostate cancer. Followup with Dr. Patsi Sears. Followup PSA. If he      needs to have open surgery, given his recent cath in September, he      can.  4. Anxiety. One of Jawanza's biggest problems. However, he seems to be      coping with it better. Continue Xanax. Would add an SSRI in the      future.  5. Previous smoking. Continue abstinence. Prescription for Nicorette      gum given at 2 mg chewing tabs. I congratulated Kannan on not  smoking.     Noralyn Pick. Eden Emms, MD, [facility]  Electronically Signed    PCN/MedQ  DD: 06/27/2007  DT: 06/27/2007  Job #: [id number]

## 2010-11-24 NOTE — Assessment & Plan Note (Signed)
[facility]                            CARDIOLOGY OFFICE NOTE   CID, AGENA                     MRN:          [id number]  DATE:03/26/2008                            DOB:          1945/09/22    Lucas [name] returns today followup.  He has previous history of coronary  artery bypass surgery.  His last cath, which was done on March 23, 2007 showed occluded vein grafts to the first obtuse marginal branch and  diagonal branch in right.  He has a patent graft to the second obtuse  marginal branch and LIMA.  He has some collaterals to the distal right  coronary artery.   He is not having chest pain.  He quit smoking for about 3 months, but is  back to less than a pack a day.  We talked about this.  I counseled him  for less than 10 minutes regarding smoking cessation.  He will try to go  back on the nicotine patches at 14 mg.   He has previously had some success quitting with Nicorette gum.  I told  him to avoid the 21 mg patches as this would be too much for him.   He will pick these up next week and begin to try to cut back on his  smoking.   The long-term risk of his remaining vein graft occluding in the risk of  lung cancer were discussed.  The patient already has some significant  emphysema clinically.  Otherwise, he is doing well.  He is not having  chest pain.  He is active.  He has mild exertional dyspnea, which is  unchanged.  He has not had any coughing.   REVIEW OF SYSTEMS:  Otherwise negative.  He has been compliant with his  medications.  He has had a chronically elevated PSA and has urology  followup for this.   He is on:  1. Cardura 8 mg a day.  2. Lisinopril 10 mg a day.  3. Xanax.  4. He had not been taking aspirin on a regular basis and I told him he      really should be doing this.  5. He does not need to be on a beta-blocker despite his coronary      artery disease as his heart rates are usually in the high 50s to     low 60s.   PHYSICAL EXAMINATION:  GENERAL:  Remarkable for a middle-aged white male  who looks older than his stated age.  VITAL SIGNS:  Blood pressure is 130/80, pulse is 58 and regular,  respiratory rate is 14, and afebrile.  HEENT:  Unremarkable.  NECK:  Carotids are normal without bruit.  No lymphadenopathy,  thyromegaly, or JVP elevation.  LUNGS:  Have mild end-expiratory wheezes.  Good diaphragmatic motion.  CARDIAC:  S1 and S2.  Normal heart sounds.  PMI normal.  ABDOMEN:  Benign.  Bowel sounds positive.  No AAA.  No tenderness.  No  bruit.  No hepatosplenomegaly.  No hepatojugular reflux.  No tenderness.  EXTREMITIES:  Distal pulses are intact.  No edema.  NEURO:  Nonfocal.  SKIN:  Warm and dry.  No muscular weakness.   EKG is entirely normal.   IMPRESSION:  1. Coronary artery disease, previous coronary artery bypass graft with      failed vein grafts.  Continue current medical therapy.  Avoid beta-      blockers for time being given resting bradycardia.  2. Hypertension.  Currently, well controlled.  We will continue      current dose lisinopril and low-salt diet.  3. Smoking cessation.  The patient is to start 14 mg Nicorette patch.      I told him after about 6 weeks to try to cut it back to 7 mg once      he is down to less than 10 cigarettes a day.  4. History of prostate cancer with elevated PSA.  Continue Cardura.      Followup PSA.  Follow up with urology.   Overall, I think Lucas [name] is doing okay and I will see him back in 6  months.      Noralyn Pick. Eden Emms, MD, [facility]  Electronically Signed    PCN/MedQ  DD: 03/26/2008  DT: 03/26/2008  Job #: [id number]

## 2010-11-27 NOTE — H&P (Signed)
NAME:  TRYSTIAN, CRISANTO NO.:  [id number]   MEDICAL RECORD NO.:  [id number]          PATIENT TYPE:  INP   LOCATION:  1830                         FACILITY:  [facility]   PHYSICIAN:  Duke Salvia, M.D.  DATE OF BIRTH:  Feb 20, 1946   DATE OF ADMISSION:  01/06/2005  DATE OF DISCHARGE:                                HISTORY & PHYSICAL   HISTORY OF PRESENT ILLNESS:  Mr. Winston Misner is a 65 year old gentleman  with known coronary artery disease, status post bypass surgery in 1998 with  PCIs of the RCA in 1999, as well as August of 2002, at which time he also  had normal left ventricular function.  He has done fairly well, though he  has been in and out of follow up with Dr. Eden Emms.  A catheterization had  been recommended some time ago (this never got done), and he comes in today  having had waxing and waning pain and recurrent episodes of presyncope that  occurred after having push mowed a 4 acre lawn.  He did the lawn okay, he  was taking some fluids, and he had the abrupt onset of tingling pains in his  chest associated with presyncope.  He fell to his knees.  The episode lasted  about 8-10 seconds.  He was able to get himself back up.  There was some  residual nausea.  A few minutes later, the same symptoms recurred, this time  lasting maybe 30 seconds.  Again, he fell to his knees, and at this point  EMS was called, and they transported him to [facility].   He received nitroglycerin with resolution of his discomfort.  There has been  some waxing and waning discomfort as he has been in the emergency room,  accompanied by nausea.  There has been no associated diaphoresis.  He  describes the pains as tingly, in the mid-portion of his chest.  They are  not radiating.  He has had no prior history of syncope.  He has no  associated palpitations.  His exercise tolerance is quite good.   Notably, he continues to smoke.  He does not take lipid-lowering therapy,  apart from  oatmeal, honey, vinegar, and garlic, which has gotten his total  cholesterol in the 180s.   PAST MEDICAL HISTORY:  In addition to the above, past medical history is  notable for -  1.  Multiple surgeries of his head, neck, and low back, for which he sees      Dr. Thyra Breed in the pain clinic.  2.  He is status post left nephrectomy for cancer.   SOCIAL HISTORY:  He is married.  He and his wife both smoke and are both  trying to stop.   CURRENT MEDICATIONS:  1.  __________ for prostatism.  2.  Xanax 1 mg t.i.d.  3.  Tenormin 25.   PHYSICAL EXAMINATION:  GENERAL:  He is a 65 year old gentleman who looks  considerably older than his stated age.  VITAL SIGNS:  His blood pressure was 106/74 with a pulse of 55.  HEENT:  Demonstrated no evidence of xanthoma.  There were multiple scars on  the anterior neck.  BACK:  Without kyphosis or scoliosis.  LUNGS:  Clear, although breath sounds were diminished.  HEART:  Heart sounds were regular without murmurs or gallops.  ABDOMEN:  Soft with active bowel sounds, without midline pulsation or  organomegaly, and there is no tenderness.  EXTREMITIES:  Femoral pulses were 2+, distal pulses were intact.  There was  no clubbing, cyanosis, or edema.  NEUROLOGIC:  Grossly normal.  SKIN:  Warm and dry.   Electrocardiogram demonstrates sinus rhythm at __________ of 0.18/0.10/0.49.  There is evidence of a prior anterior wall MI.  An old electrocardiogram is  not available for comparison.   Point of care enzymes are notable for myoglobins that are elevated at 380.   Other laboratories are not yet available.   IMPRESSION:  1.  Recurrent chest pain with features consistent with unstable angina with      a positive myoglobin.  2.  Presyncope associated with abrupt onset, and also associated with #1.  3.  Known coronary artery disease.      1.  Status post coronary artery bypass graft.      2.  Status post prior PCI of the RCA in 1999, as well as  August of 2002,          with ejection fraction noted that was normal.  4.  Relative bradycardia.  5.  Multiple neck and shoulder surgeries.  6.  Still smoking.  7.  MORPHINE allergy.   Mr. Searfoss has recurrent chest pain in the context of known severe coronary  artery disease and paucity of follow up and a persistent risk factor profile  that is unappealing.  I am concerned about his elevated myoglobin, although  this may be related secondary to his working hard in the yard.  Will need to  see what his repeat enzymes are, especially given the fact that he is having  continued problems with some discomfort and nausea.   I am also concerned about the presyncope spell that heralded all of his  symptoms today.  The brevity of it suggests that there may have been  secondary arrhythmia.  Evaluation of that possibility would depend on what  his ejection fraction turns out to be, and whether he turns out to rule in  for myocardial infarction.   PLAN:  1.  Admit.  2.  Rule out myocardial infarction.  3.  Nitroglycerin and heparin.  Continue Lovenox.  4.  No morphine.  5.  Catheterization.  6.  Fasting lipid profile.  7.  Stop smoking.       SCK/MEDQ  D:  01/06/2005  T:  01/06/2005  Job:  [id number]   cc:   L. Lupe Carney, M.D.  [address] [place]  [place] [address]  Fax: [phone]   Kathrin Penner. Vear Clock, M.D.  [address]  [place]  [place] [address]  Fax: [phone]

## 2010-11-27 NOTE — Discharge Summary (Signed)
[place]  Patient:    Lucas [name], Lucas [name]                     MRN: [id number] Adm. Date:  [id number] Disc. Date: [id number] Attending:  Josie Saunders                           Discharge Summary  ADMISSION DIAGNOSES: 1. Cervical spondylosis. 2. Degenerative joint disease. 3. 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  DISCHARGE DIAGNOSES: 1. Cervical spondylosis. 2. Degenerative joint disease. 3. 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 4. Prior neck fusion.  HISTORY OF PRESENT ILLNESS:  Mizael Sagar is a 65 year old disabled man who previously has undergone anterior cervical diskectomy and fusion at the C4-5 level and prior to that at the C5-6 and C6-7 levels.  He developed left arm pain and was found to have significant foraminal stenosis at C3-4 on the left. It was elected to admit him to the hospital and take him to surgery on a same-day procedure basis.  HOSPITAL COURSE:  On December 22, 1999, he underwent exploration of his prior fusion, which appeared to be solidly arthrodesed.  He had his plate at E9-5 removed and then underwent anterior cervical diskectomy and fusion at the C3-4 level with allograft, bone graft, and anterior cervical plate at that level. He tolerated his surgery well.  Postoperatively, he had good relief of his upper extremity pain, but did have some incisional discomfort.  He was doing well on the morning of December 23, 1999.  DISPOSITION:  He was discharged home in stable and satisfactory condition, having tolerating his operation and hospitalization well.  DISCHARGE MEDICATIONS:  Vicodin 7.5/500 mg one to two every four to six hours as needed for pain.  ACTIVITIES:  Instructions of no lifting, bending, twisting, or driving.  WOUND CARE:  Okay to shower, but not to soak his incision.  FOLLOW-UP:  Follow up in two to three weeks with Danae Orleans. Venetia Maxon, M.D., with a lateral C spine x-ray at that  time. DD:  12/23/99 TD:  12/25/99 Job: [id number]

## 2010-11-27 NOTE — H&P (Signed)
[place]  Patient:    Lucas [name], Lucas [name]                     MRN: [id number] Adm. Date:  [id number] Attending:  Josie Saunders                         History and Physical  REASON FOR ADMISSION:  Neck pain and left upper extremity radicular pain with cervical spondylosis, degenerative disk disease, and herniated disk at C3-4 with a history of prior intracervical diskectomy and fusion at the C4-5, C5-6, and C6-7 levels.  HISTORY OF PRESENT ILLNESS:  Lucas [name] is a 65 year old disabled man with coronary artery disease and who is status post several neck operations in the past.  He had a cervical laminectomy by Dr. Gasper Sells in 1990 and then at roughly the same time had an intracervical diskectomy and fusion at C5-6 and C6-7 levels by Dr. Wyonia Hough.  He saw Dr. Gerlene Fee for a herniated disk at C4-5 on the right, and it was recommended by Dr. Gerlene Fee that he seek a second opinion with me.  I saw him and recommended that he undergo anterior cervical diskectomy and fusion at C4-5 level.  He tolerated that procedure well but postoperatively had subsequently developed significant neck pain and left upper extremity pain.  The patient appeared to heal his prior surgery well and had no evidence of any instability or pseudoarthrosis at the C4-5 level.  he had a cervical myelogram performed in February 2001 which shows a foraminal stenosis and degenerative disease at C3-4 with good decompression of C4-5 and no other significant nerve root compression at other levels.  The patient continued to require pain medication on a consistent basis and continues to complain of neck pain and left upper extremity pain.  He was subsequently cleared by Dr. Eden Emms, his cardiologist, for repeat neck surgery.  The patient elected to go ahead with anterior cervical diskectomy and fusion at the C3-4 level with exploration of the prior fusion at the C4-5 level.  PLAN:  The  patient is aware of the potential risks of surgery which include but are not limited to bleeding, infection, risk of anesthesia, injury to various neck structures including trachea or esophagus which could cause either temporary or permanent swallowing difficulties and also potential for perforation of the esophagus which might require operative intervention, pharynx, recurring laryngeal nerve which could cause either temporary or permanent vocal cord paralysis resulting in either temporary or permanent voice changes, injury to cervical nerve roots which could cause either temporary or permanent arm pain, numbness, and/or weakness.  There is a small chance of injury to the spinal cord which could cause paralysis.  There is the potential for malplacement of instrumentation, failure of fusion, need for repeat surgery, degenerative disease at other levels in the neck, failure to relieve the pain, worsening of the pain.  I also discussed that he would lose neck mobile with this surgery.  It is typical to stay in the hospital overnight after surgery.  He will not be able to drive typically for at least two weeks after surgery.  The patient is aware of potential risks and benefits of surgery.  He wishes to go ahead with surgery, and this is to be performed on same-day-as-admission basis on December 22, 1999. DD:  12/22/99 TD:  12/22/99 Job: [id number]

## 2010-11-27 NOTE — Discharge Summary (Signed)
[place]. [facility]  Patient:    Lucas [name], Lucas [name] Visit Number: [id number] MRN: [id number]          Service Type: MED Location: [phone] Attending Physician:  Mirian Mo Dictated by:   Joellyn Rued, P.A.-C. Adm. Date:  03/01/2001 Disc. Date: 03/03/01   CC:         Lucas [name], M.D.   Referring Physician Discharge [place]  DATE OF BIRTH:  [birth year]  SUMMARY OF HISTORY:  Lucas [name] is a 65 year old white male who presented to the emergency room with a two-week history of chest discomfort which became worse on the day of admission.  It occurred at rest at began at 9 a.m.  It developed gradually, but he had sudden diaphoresis and radiation of discomfort down the left arm and into the neck.  He took two sublingual nitroglycerin with relief and called Dr. Melvyn Neth D. Mitchells, who instructed him to go to the emergency room.  He has a history of five-vessel bypass surgery, multiple myocardial infarctions, interventions on coronary artery disease, hypertension, continued tobacco abuse, kidney cancer, prostate cancer, but refuses biopsy, with elevated PSAs, spondylosis, DJD, and osteoarthritis.  LABORATORY DATA:  Fasting lipids showed a total cholesterol of 206, triglycerides 305, HDL 27, and LDL 116.  CKs and troponins were negative for myocardial infarction.  The sodium was 139, potassium 3.4, BUN 18, and creatinine 1.0.  Normal LFTs.  Glucose 100.  PT 14.3, PTT 29. Hemoglobin 15.5, hematocrit 44.1, platelets 175, WBC 9.0.  Subsequent checks on hematology were essentially unremarkable.  The EKG showed normal sinus rhythm, sinus bradycardia, nonspecific T-wave changes, and delayed R-wave progression.  HOSPITAL COURSE:  Lucas [name] was admitted to the hospital and placed on IV heparin.  Overnight he ruled out for myocardial infarction.  On March 02, 2001, Jonelle Sidle, M.D., performed cardiac catheterization.  He had native  three-vessel coronary artery disease with a patent LIMA to the LAD, patent saphenous vein graft to the diagonal 2, patent saphenous vein graft to the OM1, and patent saphenous vein graft to the OM3.  His saphenous vein graft to the RCA was occluded.  The native RCA had a 70% end-stent restenosis, a distal 30% lesion, and a 60% PDA.  After reviewing the films, Veneda Melter, M.D., performed angioplasty on the RCA with a cutting balloon, reducing the 70% lesion to less than 20%.  Post sheath removal and bed rest, the patient was doing well and the catheterization site was intact.  The postprocedure BUN and creatinine were 14 and 1.2, respectively, sodium 141, and potassium 3.8. The H&H was 13.9 and 39.9, normal indices, platelets 172, and WBC 8.9.  DISCHARGE DIAGNOSES: 1. Unstable angina. 2. Progressive coronary artery disease.  DISPOSITION:  He is discharged home.  DISCHARGE MEDICATIONS:  His medications include a new prescription for Plavix 75 mg and sublingual nitroglycerin since he does not have any at home.  He was asked to continue aspirin 325 mg q.d., Toprol XL 50 mg q.d., Cardura 4 mg q.h.s., Xanax 1 mg four times a day, and Vicodin as previously.  ACTIVITY:  He was advised no lifting, driving, sexual activity, or heavy exertion for two days.  DIET:  Maintain a low-salt, low-fat, low-cholesterol diet.  WOUND CARE:  If he has any problems with his catheterization site, he is to call immediately.  SPECIAL INSTRUCTIONS:  He was advised no smoking or tobacco products.  FOLLOW-UP:  He will see Dr. Theron Arista C. Nishans P.A.  on March 20, 2001, at 11 a.m.  At that time, his cholesterol needs to be reviewed and considered medical treatment for his hyperlipidemia.  The decision will also need to be made on low long he needs to continue his Plavix. Dictated by:   Joellyn Rued, P.A.-C. Attending Physician:  Mirian Mo DD:  03/03/01 TD:  03/03/01 Job: [id number]

## 2010-11-27 NOTE — H&P (Signed)
[facility]  Patient:    NICKALOUS, STINGLEY Visit Number: [id number] MRN: [id number]          Service Type: PMG Location: [facility] Attending Physician:  Thyra Breed Dictated by:   Thyra Breed, M.D. Admit Date:  03/08/2001   CC:         Tanya Nones. Jeral Fruit, M.D.   History and Physical  Mr. Arita Miss is a 65 year old gentleman who was sent to [place] by Dr. Jeral Fruit for evaluation and management of his chronic neck pain.  The patient has a very complex history of neck problems which date back to the early 1990s.  He was initially operated on by Dr. Gasper Sells with a ganglionectomy and a repeat surgery of some sort followed by a surgery by Dr. Wyonia Hough and subsequently two surgeries by Dr. Venetia Maxon.  He has been left with persistent neck pain which he feels like was exacerbated by his most recent surgery with radiation of this discomfort into his left upper extremity.  He describes the pain as a constant achy discomfort with some numbness into the left upper extremity, predominantly over the dorsum of the left hand.  Dr. Venetia Maxon had treated him with hydrocodone 10/660 two tablets q.i.d. which he stated made the pain more tolerable.  Then he was switched to OxyContin 40 mg q.8h. which apparently was not covered by medicaid and he was not able to afford this.  In addition, it caused nausea and vomiting.  He was recently seen by Dr. Jeral Fruit after Dr. Venetia Maxon left town and reduced to one tablet q.i.d. of the hydrocodone and states that he has had his pain increase in severity.  Dr. Jeral Fruit had reduced his pain medications because of concerns with regard to the Tylenol content and the fact that the patient has only one kidney.  I reviewed this with him and advised him of the wisdom in his adjustments.  He currently describes problems with a constant ache in the neck which radiates down to the left supraclavicular area and out to the left upper extremity.  He recently had a bout of  angina which was in the same distribution, but was able to discern the two.  Through the years he has been treated with amitriptyline, Neurontin, Tegretol, OxyContin, Vicodin, and Tylox, but found that the hydrocodone has been most helpful.  He denies any bowel or bladder incontinence.  He has had some weakness of his left upper extremity and numbness and tingling as mentioned.  He is currently recovering from a recent angioplasty done about 10 days ago and does have significant coronary artery disease status post coronary artery bypass grafting.  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  He is also treated for chronic hypertension.  CURRENT MEDICATIONS: 1. Hydrocodone 10/750 one p.o. q.6h. 2. Xanax 1 mg q.6h. 3. Toprol XL 50 mg q.d. 4. Cardura. 5. Plavix. 6. Aspirin.  ALLERGIES:  MORPHINE.  FAMILY HISTORY:  Positive for coronary artery disease, diabetes, emphysema, hypertension.  PAST MEDICAL HISTORY:  Coronary artery disease, prostate cancer, hypertension, history of renal cell carcinoma, hay fever.  PAST SURGICAL HISTORY:  Significant for the angioplasty as mentioned, the multiple fusions of his neck, nephrectomy, five vessel bypass grafting and stenting of his heart.  SOCIAL HISTORY:  The patient is formerly a two pack per day history.  He is down to one pack per month.  He does not drink alcohol.  He formerly was a Neurosurgeon.  He has been on social security disability since 46.  REVIEW OF SYSTEMS:  GENERAL:  Significant for night sweats.  HEENT:  Head significant for occipital headaches associated with cervical spondylosis. Eyes:  Negative.  Nose, mouth, throat:  Significant for hoarseness since his last surgery, otherwise negative.  Ears:  Negative.  PULMONARY:  Negative. CARDIOVASCULAR:  See active medical problems. GASTROINTESTINAL:   Negative. GENITOURINARY:  See active medical problems.  MUSCULOSKELETAL:  See HPI. NEUROLOGIC:  See HPI.  No history of seizure or stroke.  CUTANEOUS:  Negative. ENDOCRINE:  Negative.  HEMATOLOGIC:  Negative.  PSYCHIATRIC:  He has recently been getting depressed over his pain.  ALLERGY/IMMUNOLOGIC:  Positive for hay fever.  PHYSICAL EXAMINATION  VITAL SIGNS:  Blood pressure 183/77, heart rate 73, respiratory rate 17, O2 saturation 96%, pain level 9/10.  GENERAL:  This is a pleasant male who expresses frustrations with regards to his pain.  HEENT:  Head was normocephalic, atraumatic.  Eyes:  Extraocular movements are intact with conjunctivae and sclerae clear.  Nose:  Patent nares.  Oropharynx demonstrates dental plates.  NECK:  Restriction in range of motion such that he had minimal extention forward flexion to 20 degrees rotation to about 30 degrees on each side, 10 degrees of lateral flexion to the right, 20 degrees to the left.  Carotids were 2+ and symmetric without bruits.  He demonstrated well healed surgical scars over his neck.  LUNGS:  Clear.  HEART:  Regular rate and rhythm.  ABDOMEN:  Bowel sounds present.  GENITALIA:  Not performed.  RECTAL:  Not performed.  BACK:  Straight leg raise sign is negative.  EXTREMITIES:  Radial pulses and dorsalis pedis pulses were 2+ and symmetric. He had no edema or cyanosis.  NEUROLOGIC:  The patient was oriented to person, place, time, and reason for visit.  Cranial nerves 2-12 grossly intact.  Deep tendon reflexes were symmetric in the upper and lower extremities with downgoing toes.  There was no clonus.  Motor seemed relatively intact, although it was difficult to assess in his left upper extremity since he had so much discomfort on resistive testing.  He did not appear to give a full forced effort.  Sensory: The patient has attenuated pin prick over the dorsum of the left hand and over the lower extremities to the knees  bilaterally.  Vibratory sense was intact.   IMPRESSION: 1. Chronic neck and left upper extremity pain with underlying lumbar    spondylosis status post fusion with ongoing pain. 2. Coronary artery disease per Dr. Eden Emms. 3. Hypertension, status post renal cell carcinoma resection, prostate cancer,    hay fever per Dr. Lupe Carney.  DISPOSITION: 1. I advised the patient that we could try him on a Duragesic patch to see    whether we could control his pain a little bit better but it would be best    to try to get him off combination drugs with Tylenol as this potentially    has risks of renal and hepatic toxicity.  I reviewed this with him and his    fiancee in detail.  He seems to understand.  We will try him on a Duragesic    patch 50 mcg one applied every three days #10.  He was made fully aware of    the potential risks of this and benefits.  I gave him Lorcet Plus 10/750    one p.o. q.6h. p.r.n. breakthrough pain #60  with no refill to be taken    exclusively for breakthrough pain. 2. I plan to see him in followup in four weeks. 3. He will go ahead and sign an opiates agreement with [place] today.  We reviewed    the side effects in great detail. Dictated by:   Thyra Breed, M.D. Attending Physician:  Thyra Breed DD:  03/10/01 TD:  03/10/01 Job: [id number]

## 2010-11-27 NOTE — Op Note (Signed)
. [facility]  Patient:    YUUKI, SKEENS                     MRN: [id number] Proc. Date: 12/22/99 Adm. Date:  [id number] Disc. Date: [id number] Attending:  Josie Saunders                           Operative Report  REASON FOR ADMISSION:  Spondylosis, degenerative disk disease and herniated disk at C3-4 with left C4 radiculopathy.  POSTOPERATIVE DIAGNOSIS:  Spondylosis, degenerative disk disease and herniated disk at C3-4 with left C4 radiculopathy.  PROCEDURE: 1. Removal of plate from prior fusion at C4-5 with exploration of fusion. 2. Anterior cervical diskectomy and fusion at C3-4 with allograft bone graft    and anterior cervical plate.  SURGEON:  Danae Orleans. Venetia Maxon, M.D.  ASSISTANT:  Alanson Aly. Roxan Hockey, M.D.  ANESTHESIA:  General endotracheal.  ESTIMATED BLOOD LOSS:  300 cc.  COMPLICATIONS:  None.  DISPOSITION:  Recovery.  INDICATIONS:  Caydence Enck is a 65 year old man, who has previously undergone multiple neck surgeries.  He underwent posterior decompression by Dr. Gasper Sells, subsequently anterior fusion at C5-6 and C6-7 levels by Dr. Wyonia Hough and an anterior cervical diskectomy and fusion at C4-5 by myself approximately 10 months ago.  He did well following that procedure, but subsequently developed left upper extremity pain and neck pain and it was felt, based on postoperative myelography, that he had significant foraminal stenosis at C3-4 with osteophyte formation at that level with what appeared to be a solid fusion of the C4-5 level.  It was elected, after he did not improve with conservative management, including pain medication, to take him to surgery for anterior cervical diskectomy and fusion at the C3-4 level and exploration of prior surgery at the C4-5 level.  PROCEDURE:  Mr. Barrows was brought to the operating room.  Following the satisfactory and noncomplicated induction of general endotracheal anesthesia and  placement of intravenous lines, he was placed in the supine position, his neck was placed in slight extension.  He was placed in 10 pounds of Holter traction.  His anterior neck was prepped and draped in usual sterile fashion. Area of plain incision was infiltrated with 0.25% Marcaine and 0.5% lidocaine and 1:200,000 epinephrine.  A prior anterior neck incision was reopened and using sharp dissection, this was carried through the previous scar tissue through the anterior border of the sternocleidomastoid muscles and using blunt dissection, the anterior cervical spine was identified.  The prior plate at the V7-8 level was identified and this was cleared of investing scar tissue. The prior plate was then removed and the old fusion was explored.  There was a solid fusion line.  There was blending of the bone graft along the superior aspect of the fusion.  There was a small gap in the upper part of the graft along the inferior margin of the fusion, but below this appeared to be a solid fusion.  There was no movement at this level.  It was felt that this level was completely fused.  Attention was then turned to the C3-4 level, where scar tissue was incised sharply and cleared off the anterior cervical spine.  Disk space at C3-4 level was identified and the spinal needle was placed, confirming appropriate level.  The disk space was then incised with a 15 blade and disk material was removed in a piecemeal fashion using a variety of  pituitary rongeurs and microcurets.  The hand-held retractors were used to facilitate exposure.  The disk space spreaders were placed and the microscope was brought into the field and using microdissection technique, the osteophytes of C3 and C4 were drilled down.  These were removed and the dura was decompressed.  The right C4 nerve root was widely decompressed and the nerve was decompressed as it extended out the neural foramen.  On the left, a similar decompression  was performed and there was a piece of soft disk material directly overlying the nerve root, which was removed.  The neural foramen was felt to be well-decompressed at that point.  A 9 mm thick iliac crest allograft bone graft was then cut to a depth of 13 mm, thickness of 7 mm and was countersunk into the interspace appropriately.  A 27.5 mm Atlantis anterior cervical plate was then affixed to the anterior cervical spine with two 13 x 4.5 mm screws at C4 and two 13.4 mm screws at C3.  All screws had excellent purchase.  Locking mechanisms were engaged.  The wound was then copiously irrigated with bacitracin saline, soft tissues were  inspected and found to be in good repair.  The final x-ray confirmed positioning of bone graft and anterior cervical plate.  The wound was then closed with 3-0 Vicryl stitches reapproximating the platysma layer and a 4-0 running subcuticular Vicryl stitch reapproximating the skin edges.  Wound was dressed with benzoin, Steri-Strips, Telfa gauze and tape.  Patient was extubated in the operating room and taken to recovery room in stable satisfactory condition, having tolerated his operation well. DD:  12/22/99 TD:  12/24/99 Job: [id number]

## 2010-11-27 NOTE — Cardiovascular Report (Signed)
[place]  Patient:    Lucas [name], Lucas [name] Visit Number: [id number] MRN: [id number]          Service Type: MED Location: [phone] Attending Physician:  Mirian Mo Dictated by:   Jonelle Sidle, M.D. Proc. Date: 03/02/01 Adm. Date:  03/01/2001 Disc. Date: 03/03/2001                          Cardiac Catheterization  DATE OF BIRTH:  Nov 04, 1945  PRIMARY [place] CARDIOLOGIST:  Daisey Must, M.D.  PROCEDURES PERFORMED:  Left heart catheterization, selective coronary angiography, bypass graft angiography, left ventriculography.  DESCRIPTION OF PROCEDURE:  After informed consent was obtained, the patient as taken to the cardiac catheterization lab.  He was prepped and draped in the usual sterile fashion in the area about the right femoral artery.  It was anesthetized with 1% lidocaine.  A 6 French sheath was placed in the right femoral artery via the modified Seldinger technique.  Selective coronary and bypass graft angiography was performed in multiple planes using JL4 and JR4 LCB and IMA catheters.  A left ventriculogram was performed using a angled pigtail catheter.  The patient tolerated the procedure well without obvious complications.  HEMODYNAMICS:  Left ventricle 132/20 mmHg (postangiography).  Aorta 132/72 mmHg.  ANGIOGRAPHIC FINDINGS: 1. The left main coronary artery is free of flow-limiting coronary artery    disease. 2. The left anterior descending has a 40-50% proximal stenosis and is    occluded essentially at the takeoff of the first septal perforator.  There    is a more distal occluded diagonal branch that is seen to fill by a    saphenous vein graft.  The distal left anterior descending is small and    diffusely diseased.  There is no antegrade flow and this portion is    visualized via opacification of the left internal mammary artery and    graft. 3. The circumflex is a small diffusely diseased  vessel with an approximately    70% stenosis at the mid vessel level followed by a more distal 40%    stenosis.  There appeared to be four obtuse marginal branches.  The first    two branches are small.  There is a 50% stenosis in the first obtuse    marginal branch and a 70% stenosis in the second obtuse marginal.  The    third and fourth obtuse marginal branches are occluded and are seen to    fill by separate saphenous vein grafts. 4. The right coronary artery has a 70% mid vessel stenosis in the previous    stent site.  There is a 30-40% distal RCA stenosis and a 60% proximal    posterior descending stenosis. 5. The saphenous vein graft to the right coronary artery is occluded    proximally. 6. The saphenous vein graft to the diagonal branch is patent with minor    luminal irregularities. 7. The saphenous vein graft to the third obtuse marginal branch is patent    with minor luminal irregularities. 8. The saphenous vein graft to the forth obtuse marginal branch is patent    with minor luminal irregularities. 9. The left internal mammary artery to left anterior descending is patent.  LEFT VENTRICULOGRAPHY:  Left ventriculography reveals an overall left ventricular ejection fraction of approximately 55% with no focal wall motion abnormalities.  There is no significant mitral regurgitation noted.  DIAGNOSES: 1. Multivessel  native coronary artery disease as described.  There is a 70%    in-stent re-stenosis in the mid right coronary artery. 2. Patent saphenous vein graft to the diagonal branch. 3. Patent saphenous vein graft at the third obtuse marginal branch. 4. Patent saphenous vein graft to the forth obtuse marginal branch. 5. Occluded saphenous vein graft to the right coronary artery. 6. Patent left internal mammary artery graft to the left anterior descending.  RECOMMENDATIONS:  After discussing case with Dr. Chales Abrahams, will proceed with percutaneous coronary intervention to treat  the 70% in-stent re-stenosis involving the mid right coronary artery. Dictated by:   Jonelle Sidle, M.D. Attending Physician:  Mirian Mo DD:  03/02/01 TD:  03/03/01 Job: [id number]

## 2010-11-27 NOTE — Cardiovascular Report (Signed)
NAME:  Lucas [name], Lucas [name] NO.:  [id number]   MEDICAL RECORD NO.:  [id number]          PATIENT TYPE:  INP   LOCATION:  3738                         FACILITY:  [facility]   PHYSICIAN:  Rollene Rotunda, M.D.   DATE OF BIRTH:  [birth year]   DATE OF PROCEDURE:  01/08/2005  DATE OF DISCHARGE:                              CARDIAC CATHETERIZATION   PRIMARY:  Dr. Lupe Carney.   CARDIOLOGIST:  Dr. Maurine Cane.   PROCEDURE:  Left heart catheterization, coronary arteriography.   INDICATIONS:  Evaluate a patient with unstable angina and previous bypass  grafting.   The patient was brought to the catheterization laboratory after informed  appropriate consent.  He is extremely anxious.  He required 8 mg of Versed  and 100 mg of fentanyl for sedation before I could even attempt the  procedure.  This kept him comfortable throughout the procedure.  His vitals  were fine.  He oxygenated well.  He was still awake through most of the  procedure but was able to tolerate the arterial puncture and catheter  manipulation without difficulty.  The extreme requirement for sedation and  high tolerance needs to be noted for future procedures.  An anterior wall  puncture was used to cannulate the artery on the first stick.  A #6 arterial  sheath was inserted via the modified Seldinger technique.  Preformed  Judkins', pigtail, left coronary bypass graft catheter and a multipurpose  catheter were utilized to cannulate the grafts.   RESULTS:  Hemodynamics:  LV 192/27, AO 172/84.  Coronaries:  The left main  had 50% stenosis.  The LAD was occluded proximally.  The remainder of the  vessel was seen to fill the LIMA.  It was a narrow vessel.  The LIMA did  back fill a large diagonal.  There apparently was an occluded diagonal as  well.  There were left to right collaterals noted.  The circumflex was  occluded in the mid segment after a long 90% stenosis.  There were two small  first obtuse marginal's  which had moderate diffuse disease.  A large mid  obtuse marginal was seen to fill via a vein graft and was free of high grade  disease.  The right coronary artery was occluded proximally.  A large PDA  which appeared to be somewhat moderately diseased and narrow in caliber  filled via collaterals from the left to the right.  The graft of the LIMA to  the LAD was widely patent.  Saphenous vein graft to the mid obtuse marginal  was widely patent.  The saphenous vein graft noted previously to go to OM-1  was occluded.  Saphenous vein graft to the diagonal was occluded.  The  saphenous vein graft to the right coronary artery was occluded.  Left  ventricle:  The left ventricle was not injected secondary to renal  insufficiency.   CONCLUSION:  High grade three vessel coronary artery disease.  Three  occluded vein grafts.   PLAN:  The patient will have aggressive medical management and secondary  risk reduction.  He needs to stop smoking.  He should  be on a Statin.  He  will be placed on Imdur.  He will continue on beta blocker.       JH/MEDQ  D:  01/08/2005  T:  01/08/2005  Job:  [id number]   cc:   L. Lupe Carney, M.D.  [address] [place]  [place] [address]  Fax: [phone]   Charlton Haws, M.D.

## 2010-11-27 NOTE — Cardiovascular Report (Signed)
[place]. [facility]  Patient:    Lucas [name], Lucas [name] Visit Number: [id number] MRN: [id number]          Service Type: MED Location: [phone] Attending Physician:  Mirian Mo Dictated by:   Veneda Melter, M.D. Proc. Date: 03/02/01 Adm. Date:  03/01/2001   CC:         Theron Arista C. Eden Emms, M.D. [facility]  Lewis D. Clovis Riley, M.D.   Cardiac Catheterization  PROCEDURE PERFORMED:  Percutaneous transluminal coronary angioplasty of the right coronary artery.  DIAGNOSES: 1. Coronary artery disease. 2. In-stent re-stenosis.  INDICATIONS:  The patient is a 65 year old white male, with a known history of coronary artery disease, who has previously undergone coronary artery bypass graft surgery in 1998.  He subsequently had an occlusion of the saphenous vein graft to the right coronary artery and underwent percutaneous intervention with a placement of a stent to the mid right coronary artery in September of 1999.  He has done well in the interim.  However, he has had recurrence of chest pain recently which has increased in frequency and severity.  He underwent cardiac catheterization earlier today showing severe narrowing of 70% in the mid section of the right coronary artery within the area of previous stenting.  He presents now for percutaneous intervention.  TECHNIQUE:  Informed consent was obtained.  The patient was already on the catheterization table.  The existing 6 French sheath was positioned in the right femoral artery and the patient was given heparin and Aggrastat on a weight-adjusted basis to maintain ACT of approximately 225 seconds.  A 6 [place] guide catheter was used to engage the right coronary artery and a 0.014 inch injection 4 wire advanced in the distal RCA.  A 3.0 x 15 mm Cutting Balloon was introduced and a total of five inflations were performed within the body of the stent up to 10 atmospheres for 30 seconds and two inflations in  the distal segment with slight overlap of the native vessel at 6 atmospheres for 30 seconds.  Repeat angiography after the administration of intracoronary nitroglycerin showed significant improvement in vessel lumen of approximately 20% residual narrowing due to an undersized stent with the native vessel.  There was no significant residual disease within the area of stenting.  There was TIMI-3 flow through the right coronary artery and this was deemed an acceptable result.  Final angiography was performed in various projections showing persistence of benefit.  The guide catheter was then removed and the sheath secured in position.  The patient was transferred to the ward in stable condition.  FINAL RESULTS:  Successful percutaneous transluminal coronary angioplasty of the mid right coronary artery with reduction of 70% in-stent stenosis to less than 20% with a 3.0 mm Cutting Balloon. Dictated by:   Veneda Melter, M.D. Attending Physician:  Mirian Mo DD:  03/02/01 TD:  03/03/01 Job: [id number]

## 2010-11-27 NOTE — Discharge Summary (Signed)
NAME:  Lucas [name], Lucas [name] NO.:  [id number]   MEDICAL RECORD NO.:  [id number]          PATIENT TYPE:  INP   LOCATION:  3738                         FACILITY:  [facility]   PHYSICIAN:  Joellyn Rued, P.A. [facility] DATE OF BIRTH:  [birth year]   DATE OF ADMISSION:  01/06/2005  DATE OF DISCHARGE:  01/09/2005                           DISCHARGE SUMMARY - REFERRING   ADMITTING PHYSICIAN:  Duke Salvia, MD.   DISCHARGING PHYSICIAN:  Charlton Haws, MD.   SUMMARY OF HISTORY:  Mr. Almanza is a 65 year old gentleman, who presented  with intermittent episodes of presyncope after mowing a four-acre yard.  He  states that he had been hydrating during the yard work.  He began having an  abrupt tingling discomfort in his chest and fell to his knees.  Symptoms  lasted 8 to 10 seconds with residual nausea.  Symptoms recurred a short time  later, and EMS transported him to [facility] for further evaluation.   PAST MEDICAL HISTORY:  1.  Continued tobacco use.  2.  Hyperlipidemia for which he does not take medications.  3.  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  4.  Multiple surgeries of the head, neck, and low back for which he is seen      in the Pain [facility].  5.  Bypass surgery in 1998 with subsequent interventions but with known LV      function by last catheterization in August of 2002.  Intermittent      followup with Dr. Eden Emms.   LABORATORY DATA:  His chest x-ray did not show any acute findings.  Admission weight was 204.  Admission H&H was 14.4 and 42.7, normal indices,  platelets 177, WBCs 12.8.  Subsequent hematologies were essentially  unremarkable, and the first H&H was 11.6 and 33.6, normal indices, platelets  182, WBCs 9.8.  Admission PTT was 28 with a PT of 14.9.  Sodium 139,  potassium 3.6, BUN 18, creatinine 2.0.  Subsequent chemistry on the 29th  showed a creatinine of 1.6.  Prior to discharge on the 1st, sodium was 143,  potassium 3.6, BUN 15, creatinine 1.2, glucose 87.   Hemoglobin A1c was  elevated at 6.2.  CK-MBs, relative indexes, and troponins were negative x3.  Admission BNP was slightly elevated at 381.  Fasting lipids showed a total  cholesterol of 192, triglycerides 248, HDL 30, LDL 112.  TSH was 1.520.  EKG  showed sinus bradycardia, delayed R-waves, no acute changes.  Echocardiogram  performed on the 29th showed an EF of 55-65% without wall motion  abnormalities, mild LVH, trivial AI, mild MAC, small to moderate left atrial  enlargement, mild increase of PASP.   HOSPITAL COURSE:  Mr. Colaizzi was admitted to [facility] by Dr.  Graciela Husbands.  It was felt that he should undergo cardiac catheterization.  He was  hydrated given his creatinine.  Echocardiogram was performed.  Dr. Eden Emms  ordered PFTs with pre- and post-bronchodilator, however, I cannot find them  on the chart.  Overnight he did not have any further symptoms, enzymes and  EKGs were negative for myocardial infarction.  On January 08, 2005, cardiac  catheterization was performed by Dr. Antoine Poche.  According to his note, he  had native three-vessel coronary artery disease, the LIMA to the LAD was  patent, the saphenous vein graft to the OM-2 was patent, the saphenous vein  graft to the OM-1 and the saphenous vein graft to the RCA were both  occluded.  LV-gram was not performed.  Dr. Antoine Poche felt with 3 out of 5  grafts occluded, he should continue medical management.  Post-  catheterization, Rhonda Barrett, PA-C, noted decreased urination with a  history of BPH.  Urology was called and a Foley catheter was passed with  ease.  He recommended followup next week with Dr. Patsi Sears.  On January 09, 2005, Dr. Eden Emms discontinued the Foley and also started the patient on  Plavix 75 mg.  He also recommended outpatient followup with Pulmonary for  COPD and to discontinue smoking.  He will see the patient back in the office  in two to three weeks.   DISCHARGE DIAGNOSES:  1.  Near syncope, uncertain  etiology.  2.  Progressive coronary artery disease with graft closure as previously      described.  3.  Tobacco use.  4.  Hyperlipidemia.  5.  History as previously.   DISPOSITION:  He is discharged home.  He received an instruction sheet for  post-cath activities and care of catheterization site.  He also received  information in regards to smoking cessation and cholesterol.   DISCHARGE MEDICATIONS:  1.  Atenolol 25 mg p.o. daily.  2.  Xanax 1 mg t.i.d.  3.  Cardura 8 mg daily.  New medications include:  1.  Imdur 60 mg daily.  2.  Aspirin 81 mg daily.  3.  Lipitor 40 mg p.o. q.h.s.  4.  Plavix 75 mg daily.  5.  Nitroglycerin 0.4 mg p.r.n.   SPECIAL INSTRUCTIONS:  He was advised not to smoke.  On Monday, he was asked  to call Dr. Imelda Pillow office to arrange followup for next week.  He was  also asked on Monday to call Dr. Fabio Bering office after 10 a.m. to arrange a  two to three week appointment with Dr. Eden Emms or his physician assistant.  At the time of followup with Dr. Eden Emms, his PFTs that were performed at the  hospital should be obtained and reviewed, and arrangements for a  followup Pulmonary appointment.  Consideration of outpatient tobacco  cessation consult should also be performed and referral to smoking cessation  support groups.  Consideration should also be given to cardiac rehab.  He  will need fasting lipids and LFTs in approximately six to eight weeks since  Lipitor was initiated.       EW/MEDQ  D:  01/09/2005  T:  01/09/2005  Job:  [id number]   cc:   Loraine Leriche L. Vear Clock, M.D.  [address]  [place]  [place] [address]  Fax: [phone]   L. Lupe Carney, M.D.  [address] [place]  [place] [address]  Fax: [phone]   Sigmund I. Patsi Sears, M.D.  [address]  [place]  [place] [address]  Fax: [phone]

## 2010-12-16 ENCOUNTER — Encounter: Payer: Self-pay | Admitting: Cardiovascular Disease

## 2010-12-16 ENCOUNTER — Encounter: Payer: Self-pay | Admitting: *Deleted

## 2010-12-17 ENCOUNTER — Ambulatory Visit (INDEPENDENT_AMBULATORY_CARE_PROVIDER_SITE_OTHER): Payer: Medicare Other | Admitting: Cardiovascular Disease

## 2010-12-17 ENCOUNTER — Encounter: Payer: Self-pay | Admitting: Cardiovascular Disease

## 2010-12-17 DIAGNOSIS — C61 Malignant neoplasm of prostate: Secondary | ICD-10-CM

## 2010-12-17 DIAGNOSIS — I2510 Atherosclerotic heart disease of native coronary artery without angina pectoris: Secondary | ICD-10-CM

## 2010-12-17 DIAGNOSIS — I10 Essential (primary) hypertension: Secondary | ICD-10-CM

## 2010-12-17 DIAGNOSIS — F17200 Nicotine dependence, unspecified, uncomplicated: Secondary | ICD-10-CM

## 2010-12-17 NOTE — Progress Notes (Signed)
Lucas [name] is seen today for F/U of lipids, HTN, and CAD with distant CABG.  Last cath 8/11  1. Severe three-vessel coronary artery disease. 2. Totally occluded saphenous vein graft to the diagonal and to the     right coronary artery which are old. 3. Left internal mammary artery to left anterior descending artery and     saphenous vein graft to the third obtuse marginal are widely     patent. 4. Normal left ventricular function.  Done well with medical Rx and had uncomplicated prostate surgery 12/11.   Walks a lot with no angina.  Had an episode last Thursday of sharp resting pain with diaphoresis.  Called 911 and ECG normal I dont think this was cardiac.  Discussed not taking Viagra and advised him not to get injections for his erectile dysfunction as these could be dangersous also  ROS: Denies fever, malais, weight loss, blurry vision, decreased visual acuity, cough, sputum, SOB, hemoptysis, pleuritic pain, palpitaitons, heartburn, abdominal pain, melena, lower extremity edema, claudication, or rash.  All other systems reviewed and negative  General: Affect appropriate Healthy:  appears stated age HEENT: normal Neck supple with no adenopathy JVP normal no bruits no thyromegaly Lungs clear with no wheezing and good diaphragmatic motion Heart:  S1/S2 no murmur,rub, gallop or click PMI normal Abdomen: benighn, BS positve, no tenderness, no AAA no bruit.  No HSM or HJR Distal pulses intact with no bruits No edema Neuro non-focal Skin warm and dry No muscular weakness   Current Outpatient Prescriptions  Medication Sig Dispense Refill  . ALPRAZolam (XANAX) 1 MG tablet Take 1 mg by mouth at bedtime as needed.        . doxazosin (CARDURA) 8 MG tablet Take 8 mg by mouth at bedtime.        Marland Kitchen HYDROcodone-acetaminophen (NORCO) 10-325 MG per tablet Take 1 tablet by mouth every 6 (six) hours as needed.        Marland Kitchen lisinopril (PRINIVIL,ZESTRIL) 20 MG tablet Take 20 mg by mouth daily.        .  nitroGLYCERIN (NITROSTAT) 0.4 MG SL tablet Place 0.4 mg under the tongue every 5 (five) minutes as needed.          Allergies  Review of patient's allergies indicates no known allergies.  Electrocardiogram:  12/11/10 NSR 81 Q lead 3 otherwise normal  Assessment and Plan

## 2010-12-17 NOTE — Assessment & Plan Note (Signed)
Well controlled.  Continue current medications and low sodium Dash type diet.    

## 2010-12-17 NOTE — Assessment & Plan Note (Signed)
F/U Dr Cassell Smiles.  Advised him not to have Viagra, or injections for erectile dysfunction as he has some areas of myocardium not optimally revascularized

## 2010-12-17 NOTE — Patient Instructions (Signed)
Your physician wants you to follow-up in: 6 MONTHS You will receive a reminder letter in the mail two months in advance. If you don't receive a letter, please call our office to schedule the follow-up appointment. 

## 2010-12-17 NOTE — Assessment & Plan Note (Signed)
Counseled for less than 10 minutes.  Use to smoke 3ppd and down to 8-10/day. Likely responsible for dyspnea

## 2010-12-17 NOTE — Assessment & Plan Note (Signed)
Stable with no angina and good activity level.  Continue medical Rx  

## 2010-12-24 ENCOUNTER — Telehealth: Payer: Self-pay | Admitting: Cardiovascular Disease

## 2010-12-24 NOTE — Telephone Encounter (Signed)
C/o pain in legs and shoulder, sweaty.

## 2010-12-24 NOTE — Telephone Encounter (Signed)
Pt bp 116/61 and pain not in legs, it's in neck and shoulder blades, and having weakness

## 2010-12-24 NOTE — Telephone Encounter (Signed)
Spoke with pt, he reports an episode of sudden, sharpe pain in neck and shoulders. He broke out in a sweat, heart felt like it was fluttering and felt heavy.bp this am 189/117 before meds. After episode bp 115/59 with pulse 49.he cont to have a dull constant pain in his neck and shoulders that does not change with movement. bp at present is 135/64 pulse 56. He also reports feeling weak when he gets up. Will forward for dr Eden Emms review. Pt will call 911 if episode happens again Deliah Goody

## 2010-12-24 NOTE — Telephone Encounter (Signed)
Pt called to report another episode of diaphoresis and general malaise with some chest pain. SBP 180 then (before Rx), then 110s, now 120s. He feels fine now. States he will call 911 if it happens again. Advised him to come to ER if he wants to be checked. He refuses because he feels fine now.

## 2011-04-23 LAB — URINALYSIS, ROUTINE W REFLEX MICROSCOPIC
Bilirubin Urine: NEGATIVE
Ketones, ur: NEGATIVE
Nitrite: NEGATIVE
Specific Gravity, Urine: 1.013
Urobilinogen, UA: 0.2

## 2011-04-23 LAB — URINE MICROSCOPIC-ADD ON

## 2011-06-16 ENCOUNTER — Ambulatory Visit: Payer: Medicare Other | Admitting: Cardiovascular Disease

## 2011-06-21 ENCOUNTER — Ambulatory Visit: Payer: Medicare Other | Admitting: Cardiovascular Disease

## 2011-06-24 ENCOUNTER — Ambulatory Visit: Payer: Medicare Other | Admitting: Cardiovascular Disease

## 2011-07-22 ENCOUNTER — Ambulatory Visit (INDEPENDENT_AMBULATORY_CARE_PROVIDER_SITE_OTHER): Payer: Medicare Other | Admitting: Cardiovascular Disease

## 2011-07-22 ENCOUNTER — Encounter: Payer: Self-pay | Admitting: Cardiovascular Disease

## 2011-07-22 DIAGNOSIS — F4322 Adjustment disorder with anxiety: Secondary | ICD-10-CM

## 2011-07-22 DIAGNOSIS — I2510 Atherosclerotic heart disease of native coronary artery without angina pectoris: Secondary | ICD-10-CM

## 2011-07-22 DIAGNOSIS — I10 Essential (primary) hypertension: Secondary | ICD-10-CM

## 2011-07-22 DIAGNOSIS — C61 Malignant neoplasm of prostate: Secondary | ICD-10-CM

## 2011-07-22 DIAGNOSIS — F17200 Nicotine dependence, unspecified, uncomplicated: Secondary | ICD-10-CM

## 2011-07-22 NOTE — Assessment & Plan Note (Signed)
Counseled on cessation less than 10 minutes Discussed Diagnosis of emphysema.  Prefers cold [place].  F/U primary for change in inhaler

## 2011-07-22 NOTE — Assessment & Plan Note (Signed)
PSA followed by Hayden Rasmussen  Urologist [place].

## 2011-07-22 NOTE — Assessment & Plan Note (Signed)
Stable with no angina and good activity level.  Continue medical Rx  

## 2011-07-22 NOTE — Patient Instructions (Signed)
Your physician wants you to follow-up in: 1 year. You will receive a reminder letter in the mail two months in advance. If you don't receive a letter, please call our office to schedule the follow-up appointment.  

## 2011-07-22 NOTE — Progress Notes (Signed)
Lucas [name] is seen today for F/U of lipids, HTN, and CAD with distant CABG. Last cath 8/11  1. Severe three-vessel coronary artery disease.  2. Totally occluded saphenous vein graft to the diagonal and to the  right coronary artery which are old.  3. Left internal mammary artery to left anterior descending artery and  saphenous vein graft to the third obtuse marginal are widely  patent.  4. Normal left ventricular function.   Done well with medical Rx and had uncomplicated prostate surgery 12/11.  PSA;s good  Counseled on smoking cessation Indicates he layed them down 2 years ago for 6 months.  Has emphysema.  Started on diskus but it chokes him  No angina   ROS: Denies fever, malais, weight loss, blurry vision, decreased visual acuity, cough, sputum, SOB, hemoptysis, pleuritic pain, palpitaitons, heartburn, abdominal pain, melena, lower extremity edema, claudication, or rash.  All other systems reviewed and negative  General: Affect appropriate Healthy:  appears stated age HEENT: normal Neck supple with no adenopathy JVP normal no bruits no thyromegaly Lungs clear with mild exp wheezng  wheezing and good diaphragmatic motion Heart:  S1/S2 no murmur,rub, gallop or click PMI normal Abdomen: benighn, BS positve, no tenderness, no AAA no bruit.  No HSM or HJR Distal pulses intact with no bruits No edema Neuro non-focal Skin warm and dry No muscular weakness   Current Outpatient Prescriptions  Medication Sig Dispense Refill  . ALPRAZolam (XANAX) 1 MG tablet Take 1 mg by mouth at bedtime as needed.        . doxazosin (CARDURA) 8 MG tablet Take 8 mg by mouth at bedtime.        Marland Kitchen HYDROcodone-acetaminophen (NORCO) 10-325 MG per tablet Take 1 tablet by mouth every 6 (six) hours as needed.        Marland Kitchen lisinopril (PRINIVIL,ZESTRIL) 20 MG tablet Take 20 mg by mouth daily.        . nitroGLYCERIN (NITROSTAT) 0.4 MG SL tablet Place 0.4 mg under the tongue every 5 (five) minutes as needed.            Allergies  Review of patient's allergies indicates no known allergies.  Electrocardiogram:  Assessment and Plan

## 2011-07-22 NOTE — Assessment & Plan Note (Signed)
Well controlled.  Continue current medications and low sodium Dash type diet.    

## 2011-07-22 NOTE — Assessment & Plan Note (Signed)
Improved  Wife died two years ago.  Has girlfriend that is supportive

## 2011-10-15 ENCOUNTER — Telehealth: Payer: Self-pay | Admitting: Cardiovascular Disease

## 2011-10-15 NOTE — Telephone Encounter (Signed)
Walk in pt Form " pt Needs Jury summons paperwork completed" sent To message Nurse 10/15/11/KM

## 2011-10-19 ENCOUNTER — Encounter: Payer: Self-pay | Admitting: *Deleted

## 2012-04-17 ENCOUNTER — Encounter: Payer: Self-pay | Admitting: *Deleted

## 2012-04-18 ENCOUNTER — Inpatient Hospital Stay (HOSPITAL_COMMUNITY)
Admission: AD | Admit: 2012-04-18 | Discharge: 2012-04-20 | DRG: 291 | Disposition: A | Discharge status: Home / Self Care | Payer: Medicare Other | Source: Ambulatory Visit | Attending: Internal Medicine | Admitting: Internal Medicine

## 2012-04-18 ENCOUNTER — Encounter (HOSPITAL_COMMUNITY): Payer: Self-pay | Admitting: *Deleted

## 2012-04-18 ENCOUNTER — Inpatient Hospital Stay (HOSPITAL_COMMUNITY): Payer: Medicare Other

## 2012-04-18 ENCOUNTER — Ambulatory Visit (INDEPENDENT_AMBULATORY_CARE_PROVIDER_SITE_OTHER): Payer: Medicaid Other | Admitting: Pulmonary Disease

## 2012-04-18 ENCOUNTER — Encounter: Payer: Self-pay | Admitting: Pulmonary Disease

## 2012-04-18 VITALS — BP 170/94 | HR 62 | Temp 98.3°F | Ht 70.0 in | Wt 207.4 lb

## 2012-04-18 DIAGNOSIS — I517 Cardiomegaly: Secondary | ICD-10-CM

## 2012-04-18 DIAGNOSIS — I5021 Acute systolic (congestive) heart failure: Principal | ICD-10-CM | POA: Diagnosis present

## 2012-04-18 DIAGNOSIS — N390 Urinary tract infection, site not specified: Secondary | ICD-10-CM | POA: Diagnosis present

## 2012-04-18 DIAGNOSIS — I509 Heart failure, unspecified: Secondary | ICD-10-CM | POA: Diagnosis present

## 2012-04-18 DIAGNOSIS — C61 Malignant neoplasm of prostate: Secondary | ICD-10-CM | POA: Diagnosis present

## 2012-04-18 DIAGNOSIS — J9600 Acute respiratory failure, unspecified whether with hypoxia or hypercapnia: Secondary | ICD-10-CM | POA: Diagnosis present

## 2012-04-18 DIAGNOSIS — J4489 Other specified chronic obstructive pulmonary disease: Secondary | ICD-10-CM | POA: Diagnosis present

## 2012-04-18 DIAGNOSIS — Z8546 Personal history of malignant neoplasm of prostate: Secondary | ICD-10-CM

## 2012-04-18 DIAGNOSIS — J449 Chronic obstructive pulmonary disease, unspecified: Secondary | ICD-10-CM

## 2012-04-18 DIAGNOSIS — F17200 Nicotine dependence, unspecified, uncomplicated: Secondary | ICD-10-CM | POA: Diagnosis present

## 2012-04-18 DIAGNOSIS — J810 Acute pulmonary edema: Secondary | ICD-10-CM

## 2012-04-18 DIAGNOSIS — I252 Old myocardial infarction: Secondary | ICD-10-CM

## 2012-04-18 DIAGNOSIS — J9601 Acute respiratory failure with hypoxia: Secondary | ICD-10-CM

## 2012-04-18 DIAGNOSIS — E785 Hyperlipidemia, unspecified: Secondary | ICD-10-CM | POA: Diagnosis present

## 2012-04-18 DIAGNOSIS — Z85528 Personal history of other malignant neoplasm of kidney: Secondary | ICD-10-CM

## 2012-04-18 DIAGNOSIS — I2510 Atherosclerotic heart disease of native coronary artery without angina pectoris: Secondary | ICD-10-CM

## 2012-04-18 DIAGNOSIS — N181 Chronic kidney disease, stage 1: Secondary | ICD-10-CM | POA: Diagnosis present

## 2012-04-18 DIAGNOSIS — R300 Dysuria: Secondary | ICD-10-CM | POA: Diagnosis not present

## 2012-04-18 DIAGNOSIS — I10 Essential (primary) hypertension: Secondary | ICD-10-CM

## 2012-04-18 DIAGNOSIS — R079 Chest pain, unspecified: Secondary | ICD-10-CM

## 2012-04-18 DIAGNOSIS — Z951 Presence of aortocoronary bypass graft: Secondary | ICD-10-CM

## 2012-04-18 DIAGNOSIS — N400 Enlarged prostate without lower urinary tract symptoms: Secondary | ICD-10-CM | POA: Diagnosis present

## 2012-04-18 DIAGNOSIS — I209 Angina pectoris, unspecified: Secondary | ICD-10-CM

## 2012-04-18 LAB — URINE MICROSCOPIC-ADD ON

## 2012-04-18 LAB — COMPREHENSIVE METABOLIC PANEL
ALT: 17 U/L (ref 0–53)
Alkaline Phosphatase: 64 U/L (ref 39–117)
CO2: 26 mEq/L (ref 19–32)
Chloride: 103 mEq/L (ref 96–112)
GFR calc Af Amer: 76 mL/min — ABNORMAL LOW (ref >90)
GFR calc non Af Amer: 65 mL/min — ABNORMAL LOW (ref >90)
Glucose, Bld: 111 mg/dL — ABNORMAL HIGH (ref 70–99)
Potassium: 3.6 mEq/L (ref 3.5–5.1)
Sodium: 139 mEq/L (ref 135–145)
Total Protein: 7.3 g/dL (ref 6.0–8.3)

## 2012-04-18 LAB — TROPONIN I: Troponin I: <0.3 ng/mL (ref <0.30)

## 2012-04-18 LAB — URINALYSIS, ROUTINE W REFLEX MICROSCOPIC
Ketones, ur: NEGATIVE mg/dL
Nitrite: POSITIVE — AB
Protein, ur: 30 mg/dL — AB
Urobilinogen, UA: 0.2 mg/dL (ref 0.0–1.0)
pH: 6.5 (ref 5.0–8.0)

## 2012-04-18 LAB — CBC WITH DIFFERENTIAL/PLATELET
Eosinophils Relative: 3 % (ref 0–5)
Lymphocytes Relative: 19 % (ref 12–46)
Lymphs Abs: 1.4 10*3/uL (ref 0.7–4.0)
MCV: 91.4 fL (ref 78.0–100.0)
Platelets: 189 10*3/uL (ref 150–400)
RBC: 4.64 MIL/uL (ref 4.22–5.81)
WBC: 7.5 10*3/uL (ref 4.0–10.5)

## 2012-04-18 LAB — APTT: aPTT: 34 seconds (ref 24–37)

## 2012-04-18 LAB — D-DIMER, QUANTITATIVE: D-Dimer, Quant: 0.66 ug/mL-FEU — ABNORMAL HIGH (ref 0.00–0.48)

## 2012-04-18 LAB — PROTIME-INR
INR: 1.22 (ref 0.00–1.49)
Prothrombin Time: 15.2 seconds (ref 11.6–15.2)

## 2012-04-18 LAB — BLOOD GAS, ARTERIAL
Acid-base deficit: 0.4 mmol/L (ref 0.0–2.0)
Patient temperature: 98.6
TCO2: 19.6 mmol/L (ref 0–100)

## 2012-04-18 MED ORDER — SODIUM CHLORIDE 0.9 % IJ SOLN
3.0000 mL | Freq: Two times a day (BID) | INTRAMUSCULAR | Status: DC
  Administered 2012-04-19 – 2012-04-20 (×3): 3 mL via INTRAVENOUS

## 2012-04-18 MED ORDER — METOPROLOL TARTRATE 1 MG/ML IV SOLN
2.5000 mg | Freq: Four times a day (QID) | INTRAVENOUS | Status: DC
  Administered 2012-04-18: 2.5 mg via INTRAVENOUS
  Filled 2012-04-18 (×5): qty 5

## 2012-04-18 MED ORDER — DEXTROSE 5 % IV SOLN
1.0000 g | Freq: Every day | INTRAVENOUS | Status: DC
  Administered 2012-04-19 (×2): 1 g via INTRAVENOUS
  Filled 2012-04-18 (×4): qty 10

## 2012-04-18 MED ORDER — HYDROCODONE-ACETAMINOPHEN 5-325 MG PO TABS
2.0000 | ORAL_TABLET | ORAL | Status: DC | PRN
  Administered 2012-04-18 – 2012-04-20 (×7): 2 via ORAL
  Filled 2012-04-18 (×7): qty 2

## 2012-04-18 MED ORDER — NITROGLYCERIN 0.4 MG SL SUBL
0.4000 mg | SUBLINGUAL_TABLET | SUBLINGUAL | Status: DC | PRN
  Filled 2012-04-18: qty 25

## 2012-04-18 MED ORDER — ACETAMINOPHEN 325 MG PO TABS: 650.0000 mg | ORAL_TABLET | ORAL | Status: DC | PRN

## 2012-04-18 MED ORDER — ASPIRIN 81 MG PO CHEW
324.0000 mg | CHEWABLE_TABLET | ORAL | Status: AC
  Filled 2012-04-18: qty 4

## 2012-04-18 MED ORDER — FENTANYL CITRATE 0.05 MG/ML IJ SOLN
25.0000 ug | INTRAMUSCULAR | Status: DC | PRN
  Administered 2012-04-18 – 2012-04-19 (×6): 50 ug via INTRAVENOUS
  Filled 2012-04-18 (×6): qty 2

## 2012-04-18 MED ORDER — ONDANSETRON HCL 4 MG/2ML IJ SOLN: 4.0000 mg | Freq: Four times a day (QID) | INTRAMUSCULAR | Status: DC | PRN

## 2012-04-18 MED ORDER — SODIUM CHLORIDE 0.9 % IV SOLN: 250.0000 mL | INTRAVENOUS | Status: DC | PRN

## 2012-04-18 MED ORDER — METOPROLOL TARTRATE 1 MG/ML IV SOLN: 2.5000 mg | INTRAVENOUS | Status: DC | PRN

## 2012-04-18 MED ORDER — ALBUTEROL SULFATE (5 MG/ML) 0.5% IN NEBU
2.5000 mg | INHALATION_SOLUTION | Freq: Four times a day (QID) | RESPIRATORY_TRACT | Status: DC
  Administered 2012-04-18 – 2012-04-19 (×3): 2.5 mg via RESPIRATORY_TRACT
  Filled 2012-04-18 (×3): qty 0.5

## 2012-04-18 MED ORDER — SODIUM CHLORIDE 0.9 % IJ SOLN: 3.0000 mL | INTRAMUSCULAR | Status: DC | PRN

## 2012-04-18 MED ORDER — FUROSEMIDE 10 MG/ML IJ SOLN
40.0000 mg | Freq: Once | INTRAMUSCULAR | Status: AC
  Administered 2012-04-18: 40 mg via INTRAVENOUS
  Filled 2012-04-18: qty 4

## 2012-04-18 MED ORDER — ASPIRIN 300 MG RE SUPP
300.0000 mg | RECTAL | Status: AC
  Filled 2012-04-18: qty 1

## 2012-04-18 MED ORDER — ALPRAZOLAM 1 MG PO TABS
1.0000 mg | ORAL_TABLET | Freq: Every evening | ORAL | Status: DC | PRN
  Administered 2012-04-18: 1 mg via ORAL
  Filled 2012-04-18 (×3): qty 1

## 2012-04-18 MED ORDER — HYDRALAZINE HCL 20 MG/ML IJ SOLN: 10.0000 mg | INTRAMUSCULAR | Status: DC | PRN

## 2012-04-18 MED ORDER — ASPIRIN 81 MG PO CHEW
324.0000 mg | CHEWABLE_TABLET | ORAL | Status: AC
  Administered 2012-04-18: 324 mg via ORAL
  Filled 2012-04-18: qty 4

## 2012-04-18 MED ORDER — INFLUENZA VIRUS VACC SPLIT PF IM SUSP
0.5000 mL | INTRAMUSCULAR | Status: AC
  Administered 2012-04-20: 0.5 mL via INTRAMUSCULAR
  Filled 2012-04-18 (×2): qty 0.5

## 2012-04-18 MED ORDER — DOXAZOSIN MESYLATE 8 MG PO TABS
8.0000 mg | ORAL_TABLET | Freq: Every day | ORAL | Status: DC
  Administered 2012-04-18 – 2012-04-19 (×2): 8 mg via ORAL
  Filled 2012-04-18 (×3): qty 1

## 2012-04-18 NOTE — Progress Notes (Signed)
Subjective:    Patient ID: Lucas [name], male    DOB: 1945/11/22, 66 y.o.   MRN: [id number]    HPI 66 yo man with complex PMH significant for COPD, CAD s/p CABG followed by [facility] presents to the clinic for COPD evaluation. On interview he tells me that he had severe chest pain, retrosternal, pressure like last week; he did not contact the cardiology office but ever since then he has not felt good. He also admits chronic shortness of breath, predominantly with exertion and when climbing a flight of stairs; he denies significant cough or LE edema.   Also complains of pain in the pelvic area and problems urinating ever since he was diagnosed with prostate cancer and had surgery, but more so, recently.   Endorses lower back pain.   Past Medical History  Diagnosis Date  . CAD (coronary artery disease)   . Hypertension   . Anxiety   . History of kidney cancer   . ADENOCARCINOMA, PROSTATE   . Adjustment disorder with anxiety   . Hyperlipidemia   . BPH (benign prostatic hypertrophy)   . COPD (chronic obstructive pulmonary disease)   . CAD (coronary artery disease)    Past Surgical History  Procedure Date  . Coronary artery bypass graft     x 5  . Cardiac catheterization   . Nephrectomy   . Posterior cervical laminectomy     x 8  . Heart stents     x 5   Allergies  Allergen Reactions  . Ibuprofen   . Pseudoephedrine Hcl    Family History  Problem Relation Age of Onset  . Diabetes Mother   . Coronary artery disease    . Heart disease Mother   . Heart disease Father   . Heart disease Brother   . Diabetes Father     History   Social History  . Marital Status: Widowed    Spouse Name: N/A    Number of Children: N/A  . Years of Education: N/A   Occupational History  . disabled    Social History Main Topics  . Smoking status: Current Some Day Smoker -- 0.3 packs/day for 54 years    Types: Cigarettes  . Smokeless tobacco: None  . Alcohol Use: No  .  Drug Use: No  . Sexually Active: None   Other Topics Concern  . None   Social History Narrative    He lives in [place] by himself.  He is a widower.  His wife died about 10 months ago.  He continues to smoke about 4 or 5  cigarettes a day but has a greater than 100 pack-year history having  previously smoked up to 3-4 packs a day over the span about 48 years.  He denies alcohol or drug use.  He is retired secondary to disability  since the 71s.     Review of Systems  Constitutional: Negative for fever, appetite change and unexpected weight change.  HENT: Negative for ear pain, congestion, sore throat, sneezing, trouble swallowing and dental problem.   Respiratory: Positive for cough and shortness of breath.   Cardiovascular: Positive for chest pain. Negative for palpitations and leg swelling.  Gastrointestinal: Negative for abdominal pain.  Musculoskeletal: Negative for joint swelling.  Skin: Negative for rash.  Neurological: Negative for headaches.  Psychiatric/Behavioral: Positive for dysphoric mood. The patient is nervous/anxious.    Current Outpatient Prescriptions  Medication Sig Dispense Refill  . ALPRAZolam (XANAX) 1 MG tablet Take 1  mg by mouth at bedtime as needed.        . COMBIVENT RESPIMAT 20-100 MCG/ACT AERS respimat 1 puff every 8 hours      . doxazosin (CARDURA) 8 MG tablet Take 8 mg by mouth at bedtime.        Marland Kitchen HYDROcodone-acetaminophen (NORCO) 10-325 MG per tablet Take 1 tablet by mouth every 6 (six) hours as needed.        . nebivolol (BYSTOLIC) 10 MG tablet Take 10 mg by mouth daily.      . nitroGLYCERIN (NITROSTAT) 0.4 MG SL tablet Place 0.4 mg under the tongue every 5 (five) minutes as needed.        Marland Kitchen PROAIR HFA 108 (90 BASE) MCG/ACT inhaler 1 puff 4-5 times daily           Objective:   Physical Exam BP 170/94  Pulse 62  Temp 98.3 F (36.8 C) (Oral)  Ht 5\' 10"  (1.778 m)  Wt 207 lb 6.4 oz (94.076 kg)  BMI 29.76 kg/m2  SpO2 95% General: moving around  and seems to be in distress due to pain in the pelvic area and in the lower back; Obese HEENT ; pupils round and reactive to light; mild nasal mucosa edema and erythema; retropharyngeal crowding with no edema or exudate; no cervical LAD  Cardiovascular: s1s2, no murmurs. Regular rate and rhythm.  Respiratory: Decreased breath sounds bilaterally with wheezing No increased work of breathing.  Abdomen: soft NT ND BS+, obese.   Extremities: no pedal edema. Homans negative  Central nervous system: Alert and oriented No focal neurological deficits  Spirometry 04/18/12 FEV1 1.64L 46% FVC  2.78L 60%  FEV1/FVC 59  CXR last available 2011; personally reviewed  Findings: Prior sternotomy for CABG. Heart mildly enlarged but  stable. Pulmonary venous hypertension without overt edema.  Prominent bronchovascular markings diffusely and central  peribronchial thickening, unchanged. No new pulmonary parenchymal  abnormalities. No visible pleural effusions.  IMPRESSION:  Stable mild cardiomegaly and COPD. No acute cardiopulmonary  disease.    2 D echocardiogram  SUMMARY - Overall left ventricular systolic function was normal. Left ventricular ejection fraction was estimated , range being 55 % to 65 %.. There were no left ventricular regional wall motion abnormalities. Left ventricular wall thickness was mildly increased. - Aortic valve thickness was mildly increased. There was trivial aortic valvular regurgitation. - There was mild mitral annular calcification. - The left atrium was mildly to moderately dilated. - The estimated peak pulmonary artery systolic pressure was mildly increased.  EKG in clinic difficult to interpret      Assessment & Plan:   1. Chest pain With patients history of CABG, obesity, smoking, HTN, HLP at very high risk for ACD. I suspect that he might have had an MI last week. We will admit to the hospital. We will obtain EKG, cardiac enzymes and ask cardiology to  evaluate. He would benefit from repeat 2 D echocardiogram.   2. COPD severe by spirometric criteria, a restrictive process likely, for confirmation the patient would need to have complete pulmonary function tests with lung volumes. We will obtain with next visit in the office. CAT 16, but suspect that some of his symptoms are related to cardiac disease. Would recommend evaluation of oxygen requirements with ambulation prior to discharge.    3. 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 Concerning for recurrent disease. He would benefit from CT pelvis. He will need a urology consult.   Dsiposition we will see him  back in clinic in 2-3 weeks post discharge.

## 2012-04-18 NOTE — H&P (Signed)
Name: Lucas [name] MRN: [id number] DOB: 12-14-1945    LOS: 0  Referring Provider:  Admit from office Reason for Referral:  Chest pain ,dyspnea  PULMONARY / CRITICAL CARE MEDICINE  HPI:  66 yo man with complex PMH significant for COPD, CAD s/p CABG followed by [facility] presents to the pulm clinic for COPD evaluation. Pt states that he had severe chest pain, retrosternal, pressure like last week; he did not contact the cardiology office but ever since then he has not felt good. He also admits chronic shortness of breath, predominantly with exertion and when climbing a flight of stairs; he denies significant cough or LE edema.  Also complains of pain in the pelvic area and problems urinating ever since he was diagnosed with prostate cancer and had surgery but more so recently.  Pain is after starting urination and then urine flow ceases. Endorses lower back pain.    Past Medical History  Diagnosis Date  . CAD (coronary artery disease)   . Hypertension   . Anxiety   . History of kidney cancer   . ADENOCARCINOMA, PROSTATE   . Adjustment disorder with anxiety   . Hyperlipidemia   . BPH (benign prostatic hypertrophy)   . COPD (chronic obstructive pulmonary disease)   . CAD (coronary artery disease)   . Myocardial infarction   . Shortness of breath    Past Surgical History  Procedure Date  . Coronary artery bypass graft     x 5  . Cardiac catheterization   . Nephrectomy   . Posterior cervical laminectomy     x 8  . Heart stents     x 5   Prior to Admission medications   Medication Sig Start Date End Date Taking? Authorizing Provider  albuterol (PROVENTIL HFA;VENTOLIN HFA) 108 (90 BASE) MCG/ACT inhaler Inhale 2 puffs into the lungs every 6 (six) hours as needed. Shortness of breath   Yes Historical Provider, MD  ALPRAZolam Prudy Feeler) 1 MG tablet Take 1 mg by mouth at bedtime as needed. anxiety   Yes Historical Provider, MD  doxazosin (CARDURA) 8 MG tablet Take 8 mg by  mouth at bedtime.     Yes Historical Provider, MD  HYDROcodone-acetaminophen (NORCO) 10-325 MG per tablet Take 1 tablet by mouth every 6 (six) hours as needed.    Yes Historical Provider, MD  Ipratropium-Albuterol (COMBIVENT RESPIMAT) 20-100 MCG/ACT AERS respimat Inhale 1 puff into the lungs every 8 (eight) hours.   Yes Historical Provider, MD  nebivolol (BYSTOLIC) 10 MG tablet Take 10 mg by mouth daily.   Yes Historical Provider, MD   Allergies Allergies  Allergen Reactions  . Morphine And Related     hallucinations  . Ibuprofen Rash    Tolerates aspirin per patient.      Family History Family History  Problem Relation Age of Onset  . Diabetes Mother   . Coronary artery disease    . Heart disease Mother   . Heart disease Father   . Heart disease Brother   . Diabetes Father    Social History  reports that he has been smoking Cigarettes.  He has a 16.2 pack-year smoking history. He has never used smokeless tobacco. He reports that he does not drink alcohol or use illicit drugs.  Review Of Systems:  11 pt ROS taken in detail and neg except as above.  Brief patient description:  66yo WM with pulmonary edema and chest pain.  Events Since Admission:   Current Status: Pt in no  acute distress  Vital Signs: Temp:  [98.3 F (36.8 C)] 98.3 F (36.8 C) (10/08 1343) Pulse Rate:  [56-70] 56  (10/08 1651) Resp:  [19-22] 19  (10/08 1651) BP: (152-170)/(73-108) 152/73 mmHg (10/08 1651) SpO2:  [95 %-99 %] 98 % (10/08 1651) Weight:  [88.1 kg (194 lb 3.6 oz)-94.076 kg (207 lb 6.4 oz)] 88.1 kg (194 lb 3.6 oz) (10/08 1530)  Physical Examination: General:  WM in nad. Neuro:  Intact, no deficits HEENT:  Mucus membranes moist, no lesions Neck:  Supple, no jvd.   Cardiovascular:  RRR  Nl s1 s2  No s3 s4 no m r h g  Lungs:  Distant BS, exp wheezes.  Poor airflow Abdomen:  Soft NT BSA   Musculoskeletal:  From, no joint deformity Skin:  clear  Active Problems:  ADENOCARCINOMA,  PROSTATE  CORONARY ARTERY DISEASE  COPD (chronic obstructive pulmonary disease)  Pulmonary edema, acute  Angina pectoris  Acute respiratory failure with hypoxia   ASSESSMENT AND PLAN  PULMONARY  Lab 04/18/12 1635  PHART 7.441  PCO2ART 33.7*  PO2ART 108.0*  HCO3 22.5  O2SAT 98.2   [place] oxygen sat 100%   CXR:  pulm edema ETT:  none  A:  Acute hypoxemic resp failure with no evidence of copd exacerbation.  Pt with Copd dx P:   Diurese Titrate oxygen BD neb meds   CARDIOVASCULAR  Lab 04/18/12 1605  TROPONINI <0.30  LATICACIDVEN --  PROBNP 4150.0*   ECG:  No acute changes Lines: PIV  A: Prior chest pain with CAD. Need to r/o prior MI, no chest pain now P:  Cycle enzymes,  Diurese Card consult Since no chest pain now will hold off on heparin drip  RENAL  Lab 04/18/12 1605  NA 139  K 3.6  CL 103  CO2 26  BUN 17  CREATININE 1.14  CALCIUM 9.4  MG 2.2  PHOS 3.3   Intake/Output    None    Foley:  none  A:  Urination pain, rectal pain, hx of adenoca of prostate P:   Urology consult  GASTROINTESTINAL  Lab 04/18/12 1605  AST 22  ALT 17  ALKPHOS 64  BILITOT 0.3  PROT 7.3  ALBUMIN 3.9    A:  No GI issues P:   monitor  HEMATOLOGIC  Lab 04/18/12 1605  HGB 14.5  HCT 42.4  PLT 189  INR 1.22  APTT 34   A:  No heme issues P:  monitor  INFECTIOUS  Lab 04/18/12 1605  WBC 7.5  PROCALCITON --   Cultures: none Antibiotics: none  A:  No acute ID issues P:   monitor  ENDOCRINE No results found for this basename: GLUCAP:5 in the last 168 hours A:  No endocrine issues   P:   monitor  NEUROLOGIC  A:  No neuro issues P:   monitor  BEST PRACTICE / DISPOSITION Level of Care:  sdu  Primary Service:  pccm Consultants:  cardiolgy , urology Code Status:  full Diet:  Po diet DVT Px:  Hep sq GI Px:  ppi Skin Integrity:  good Social / Family:  None at bedside  Shan Levans, M.D. Pulmonary and Critical Care Medicine [facility] Pager: [phone]  04/18/2012, 5:11 PM

## 2012-04-18 NOTE — Progress Notes (Signed)
  Echocardiogram 2D Echocardiogram has been performed.  Cathie Beams 04/18/2012, 4:46 PM

## 2012-04-18 NOTE — Consult Note (Signed)
CARDIOLOGY CONSULT NOTE    Patient ID: Lucas [name] MRN: [id number] DOB/AGE: [birth year] 66 y.o.  Admit date: 04/18/2012 Referring Physician: Frederico Hamman Primary Physician: Hayden Rasmussen, NP Primary Cardiologist:  Eden Emms Reason for Consultation: Chest Pain and CHF  Active Problems:  ADENOCARCINOMA, PROSTATE  CORONARY ARTERY DISEASE  COPD (chronic obstructive pulmonary disease)  Pulmonary edema, acute  Angina pectoris  Acute respiratory failure with hypoxia   HPI:  66 yo with CAD and distant CABG.  Last cath in 2008 all native arteries 100% occluded with SVG OM2 and LIMA patent.  Other grafts occluded With small OM branches.  Done well with medical Rx.  Last Tuesday had an eposode of severe chest pain.  And almost called 911 Did not take nitro Over the week has had progressive PND and dyspnea.  No previous history of CHF and LV has been normal in past.  Still smoking 3 cigs/day and was Referred for COPD w/u but admitted with history of chest pain and worsening dyspnea.  BNP elevated and CXR consistant with CHF  Also noted more Frequency and pain on urination as well as rectal pain.  @ROS @ All other systems reviewed and negative except as noted above  Past Medical History  Diagnosis Date  . CAD (coronary artery disease)   . Hypertension   . Anxiety   . History of kidney cancer   . ADENOCARCINOMA, PROSTATE   . Adjustment disorder with anxiety   . Hyperlipidemia   . BPH (benign prostatic hypertrophy)   . COPD (chronic obstructive pulmonary disease)   . CAD (coronary artery disease)   . Myocardial infarction   . Shortness of breath     Family History  Problem Relation Age of Onset  . Diabetes Mother   . Coronary artery disease    . Heart disease Mother   . Heart disease Father   . Heart disease Brother   . Diabetes Father     History   Social History  . Marital Status: Widowed    Spouse Name: N/A    Number of Children: N/A  . Years of Education: N/A   Occupational  History  . disabled    Social History Main Topics  . Smoking status: Current Some Day Smoker -- 0.3 packs/day for 54 years    Types: Cigarettes  . Smokeless tobacco: Never Used  . Alcohol Use: No  . Drug Use: No  . Sexually Active: Not on file   Other Topics Concern  . Not on file   Social History Narrative    He lives in [place] by himself.  He is a widower.  His wife died about 10 months ago.  He continues to smoke about 4 or 5  cigarettes a day but has a greater than 100 pack-year history having  previously smoked up to 3-4 packs a day over the span about 48 years.  He denies alcohol or drug use.  He is retired secondary to disability  since the 67s.     Past Surgical History  Procedure Date  . Coronary artery bypass graft     x 5  . Cardiac catheterization   . Nephrectomy   . Posterior cervical laminectomy     x 8  . Heart stents     x 5        . albuterol  2.5 mg Nebulization Q6H  . aspirin  324 mg Oral NOW   Or  . aspirin  300 mg Rectal NOW  . doxazosin  8  mg Oral QHS  . furosemide  40 mg Intravenous Once  . influenza  inactive virus vaccine  0.5 mL Intramuscular Tomorrow-1000  . metoprolol  2.5 mg Intravenous Q6H      Physical Exam: BP 152/73  Pulse 56  Temp 98.8 F (37.1 C) (Oral)  Resp 19  Ht 5\' 10"  (1.778 m)  Wt 194 lb 3.6 oz (88.1 kg)  BMI 27.87 kg/m2  SpO2 98%  Affect appropriate Overweight bronchitic male HEENT: normal Neck supple with no adenopathy JVP normal no bruits no thyromegaly Lungs rhonchi coughing and mild wheezing and good diaphragmatic motion Heart:  S1/S2 no murmur, no rub, gallop or click PMI normal Abdomen: benighn, BS positve, no tenderness, no AAA no bruit.  No HSM or HJR Distal pulses intact with no bruits Trace  edema Neuro non-focal Skin warm and dry No muscular weakness   Labs:   Lab Results  Component Value Date   WBC 7.5 04/18/2012   HGB 14.5 04/18/2012   HCT 42.4 04/18/2012   MCV 91.4 04/18/2012   PLT 189  04/18/2012    Lab 04/18/12 1605  NA 139  K 3.6  CL 103  CO2 26  BUN 17  CREATININE 1.14  CALCIUM 9.4  PROT 7.3  BILITOT 0.3  ALKPHOS 64  ALT 17  AST 22  GLUCOSE 111*   Lab Results  Component Value Date   CKTOTAL 92 11/23/2009   CKMB 1.6 11/23/2009   TROPONINI <0.30 04/18/2012    Lab Results  Component Value Date   CHOL  Value: 177        ATP III CLASSIFICATION:  <200     mg/dL   Desirable  818-299  mg/dL   Borderline High  >=371    mg/dL   High        6/96/7893   Lab Results  Component Value Date   HDL 36* 11/23/2009   Lab Results  Component Value Date   LDLCALC  Value: 125        Total Cholesterol/HDL:CHD Risk Coronary Heart Disease Risk Table                     Men   Women  1/2 Average Risk   3.4   3.3  Average Risk       5.0   4.4  2 X Average Risk   9.6   7.1  3 X Average Risk  23.4   11.0        Use the calculated Patient Ratio above and the CHD Risk Table to determine the patient's CHD Risk.        ATP III CLASSIFICATION (LDL):  <100     mg/dL   Optimal  810-175  mg/dL   Near or Above                    Optimal  130-159  mg/dL   Borderline  102-585  mg/dL   High  >277     mg/dL   Very High* 03/04/2352   Lab Results  Component Value Date   TRIG 78 11/23/2009   Lab Results  Component Value Date   CHOLHDL 4.9 11/23/2009   No results found for this basename: LDLDIRECT      Radiology: Portable Chest Xray  04/18/2012  *RADIOLOGY REPORT*  Clinical Data: Respiratory distress, chest pressure  PORTABLE CHEST - 1 VIEW  Comparison: Air 11/22/2009  Findings: Mild cardiomegaly.  Mild to moderate diffuse interstitial pattern  representing change from the prior study.  No focal consolidation or pleural effusion.  The patient is status post CABG.  IMPRESSION: Findings suggest acute pulmonary edema.   Original Report Authenticated By: Otilio Carpen, M.D.     EKG:  NSR PVC no acute ischemic changes   ASSESSMENT AND PLAN:  CHF:   New onset.  Echo for EF.  Diuresis.  Will  tentatively schedule right and left cath with grafts for tomorrow given chest pain  I dont known that he has any good targets for revascularization If SVG to OM occluded he may have ischemic MR responsible for new onset CHF So far inzymes negative and no acute ECG changes COPD:  Clinically this appears to be more of a CHF exacerbation given CXR results and BNP over 4000.  However he has long smoking history with bronchitic voice and will need PFT;s once CHF resolved.  Continue nebs Prostate:  S/P prostatectomy but significant frequency and pain as well as rectal pain May need this worked up latter  Check UA for now  Signed: Charlton Haws 04/18/2012, 5:39 PM

## 2012-04-19 DIAGNOSIS — I5021 Acute systolic (congestive) heart failure: Principal | ICD-10-CM | POA: Diagnosis present

## 2012-04-19 LAB — CBC
HCT: 39.5 % (ref 39.0–52.0)
Hemoglobin: 13.4 g/dL (ref 13.0–17.0)
MCH: 31.4 pg (ref 26.0–34.0)
MCHC: 33.9 g/dL (ref 30.0–36.0)
RBC: 4.27 MIL/uL (ref 4.22–5.81)

## 2012-04-19 LAB — BASIC METABOLIC PANEL
BUN: 22 mg/dL (ref 6–23)
CO2: 25 mEq/L (ref 19–32)
Calcium: 8.8 mg/dL (ref 8.4–10.5)
GFR calc non Af Amer: 52 mL/min — ABNORMAL LOW (ref >90)
Glucose, Bld: 104 mg/dL — ABNORMAL HIGH (ref 70–99)
Potassium: 3.5 mEq/L (ref 3.5–5.1)

## 2012-04-19 LAB — TSH: TSH: 2.018 u[IU]/mL (ref 0.350–4.500)

## 2012-04-19 LAB — POTASSIUM: Potassium: 3.8 mEq/L (ref 3.5–5.1)

## 2012-04-19 MED ORDER — ALPRAZOLAM 1 MG PO TABS
1.0000 mg | ORAL_TABLET | Freq: Three times a day (TID) | ORAL | Status: DC
  Administered 2012-04-19 – 2012-04-20 (×5): 1 mg via ORAL
  Filled 2012-04-19 (×4): qty 1

## 2012-04-19 MED ORDER — FENTANYL CITRATE 0.05 MG/ML IJ SOLN
INTRAMUSCULAR | Status: AC
  Filled 2012-04-19: qty 2

## 2012-04-19 MED ORDER — NYSTATIN 100000 UNIT/GM EX POWD
Freq: Two times a day (BID) | CUTANEOUS | Status: DC
  Administered 2012-04-19 – 2012-04-20 (×2): via TOPICAL
  Filled 2012-04-19 (×2): qty 15

## 2012-04-19 MED ORDER — ATORVASTATIN CALCIUM 40 MG PO TABS
40.0000 mg | ORAL_TABLET | Freq: Every day | ORAL | Status: DC
  Administered 2012-04-20: 40 mg via ORAL
  Filled 2012-04-19 (×3): qty 1

## 2012-04-19 MED ORDER — PHENAZOPYRIDINE HCL 100 MG PO TABS
100.0000 mg | ORAL_TABLET | Freq: Three times a day (TID) | ORAL | Status: DC
  Administered 2012-04-19 – 2012-04-20 (×3): 100 mg via ORAL
  Filled 2012-04-19 (×5): qty 1

## 2012-04-19 MED ORDER — FUROSEMIDE 20 MG PO TABS
20.0000 mg | ORAL_TABLET | Freq: Once | ORAL | Status: AC
  Administered 2012-04-19: 20 mg via ORAL
  Filled 2012-04-19: qty 1

## 2012-04-19 MED ORDER — IPRATROPIUM-ALBUTEROL 20-100 MCG/ACT IN AERS
2.0000 | INHALATION_SPRAY | Freq: Four times a day (QID) | RESPIRATORY_TRACT | Status: DC
  Administered 2012-04-19 – 2012-04-20 (×4): 2 via RESPIRATORY_TRACT
  Filled 2012-04-19: qty 4

## 2012-04-19 MED ORDER — FENTANYL CITRATE 0.05 MG/ML IJ SOLN
50.0000 ug | INTRAMUSCULAR | Status: DC | PRN
  Administered 2012-04-19 (×2): 50 ug via INTRAVENOUS
  Administered 2012-04-19: 100 ug via INTRAVENOUS
  Administered 2012-04-20 (×2): 50 ug via INTRAVENOUS
  Filled 2012-04-19 (×4): qty 2

## 2012-04-19 MED ORDER — ALPRAZOLAM 1 MG PO TABS
1.0000 mg | ORAL_TABLET | Freq: Once | ORAL | Status: AC
  Administered 2012-04-19: 1 mg via ORAL

## 2012-04-19 MED ORDER — METOPROLOL TARTRATE 12.5 MG HALF TABLET
12.5000 mg | ORAL_TABLET | Freq: Two times a day (BID) | ORAL | Status: DC
  Administered 2012-04-19 – 2012-04-20 (×2): 12.5 mg via ORAL
  Filled 2012-04-19 (×4): qty 1

## 2012-04-19 NOTE — Plan of Care (Signed)
Voided 100cc.Bladder scan was 0 cc residual.

## 2012-04-19 NOTE — Progress Notes (Signed)
Name: Lucas [name] MRN: [id number] DOB: November 01, 1945    LOS: 1  Referring Provider:  Admit from office Reason for Referral:  Chest pain ,dyspnea  PULMONARY / CRITICAL CARE MEDICINE  HPI:  66 yo man with complex PMH significant for COPD, CAD s/p CABG followed by [facility] presents to the pulm clinic for first time COPD evaluation. Pt states that he had severe chest pain, retrosternal, pressure like last week; he did not contact the cardiology office but ever since then he has not felt good. He also admits chronic shortness of breath, predominantly with exertion and when climbing a flight of stairs; he denies significant cough or LE edema.  Also complains of pain in the pelvic area and problems urinating ever since he was diagnosed with prostate cancer and had surgery but more so recently.  Pain is after starting urination and then urine flow ceases.  Current Status: Pt in no acute distress  Vital Signs: Temp:  [97.7 F (36.5 C)-98.9 F (37.2 C)] 97.7 F (36.5 C) (10/09 0820) Pulse Rate:  [39-70] 47  (10/09 0900) Resp:  [13-22] 15  (10/09 0900) BP: (98-170)/(46-108) 131/76 mmHg (10/09 0900) SpO2:  [89 %-100 %] 98 % (10/09 0953) Weight:  [88.1 kg (194 lb 3.6 oz)-94.076 kg (207 lb 6.4 oz)] 88.1 kg (194 lb 3.6 oz) (10/08 1530) 4 liters  Physical Examination: General:  WM in nad. Neuro:  Intact, no deficits HEENT:  Mucus membranes moist, no lesions Neck:  Supple, no jvd.   Cardiovascular:  RRR  Nl s1 s2  No s3 s4 no m r h g  Lungs:  Distant BS, exp wheezes.  Poor airflow Abdomen:  Soft NT BSA   Musculoskeletal:  From, no joint deformity Skin:  clear  Active Problems:  ADENOCARCINOMA, PROSTATE  CORONARY ARTERY DISEASE  COPD (chronic obstructive pulmonary disease)  Pulmonary edema, acute  Angina pectoris  Acute respiratory failure with hypoxia  Acute systolic heart failure   ASSESSMENT AND PLAN  PULMONARY  Lab 04/18/12 1635  PHART 7.441  PCO2ART 33.7*  PO2ART  108.0*  HCO3 22.5  O2SAT 98.2   [place] oxygen sat 100%   CXR:  pulm edema ETT:  none  A:  Acute hypoxemic resp failure in setting of pulmonary edema COPD w/ no evidence of copd exacerbation. P:   Diurese Titrate oxygen BD neb meds  CARDIOVASCULAR  Lab 04/19/12 0321 04/18/12 2117 04/18/12 1605  TROPONINI <0.30 <0.30 <0.30  LATICACIDVEN -- -- --  PROBNP -- -- 4150.0*   ECG:  No acute changes Lines: PIV 10/8 ECHO:The cavity size was mildly dilated. Wall thickness was increased in a pattern of moderate LVH.Systolic function was severely reduced. The estimated ejection fraction was in the range of 25% to 30%. Diffuse hypokinesis. Features are consistent with a pseudonormal left ventricular filling pattern, with concomitant abnormalrelaxation and increased filling pressure (grade 2 diastolic dysfunction).   A:  chest pain with CAD.  Possible subacute  MI CEs negtaive  Acute Systolic Heart failure Breathing improved after diuresis P:  Per cards Cath in am 10/10 if cr stable Daily lasix, scheduled BB   RENAL  Lab 04/19/12 0321 04/18/12 1605  NA 138 139  K 3.5 3.6  CL 101 103  CO2 25 26  BUN 22 17  CREATININE 1.38* 1.14  CALCIUM 8.8 9.4  MG 2.0 2.2  PHOS -- 3.3   Intake/Output      10/08 0701 - 10/09 0700 10/09 0701 - 10/10 0700   I.V. (  mL/kg) 70 (0.8)    IV Piggyback 50    Total Intake(mL/kg) 120 (1.4)    Urine (mL/kg/hr) 825 (0.4) 50   Total Output 825 50   Net -705 -50         Foley:  none  A:   Urination pain, rectal pain, hx of adenoca of prostate Renal insuff  P:   Urology consult   GASTROINTESTINAL  Lab 04/18/12 1605  AST 22  ALT 17  ALKPHOS 64  BILITOT 0.3  PROT 7.3  ALBUMIN 3.9    A:  No GI issues P:   monitor  HEMATOLOGIC  Lab 04/19/12 0321 04/18/12 1605  HGB 13.4 14.5  HCT 39.5 42.4  PLT 191 189  INR -- 1.22  APTT -- 34   A:  No heme issues P:  monitor  INFECTIOUS  Lab 04/19/12 0321 04/18/12 1605  WBC 5.9 7.5    PROCALCITON -- --   Cultures: UC 10/8>>> Antibiotics: Rocephin 10/8>>>  A:  prob UTI P:   Cont rocephin   ENDOCRINE No results found for this basename: GLUCAP:5 in the last 168 hours A:  No endocrine issues   P:   monitor  NEUROLOGIC  A:  No neuro issues P:   monitor  BEST PRACTICE / DISPOSITION Level of Care:  sdu -->tele Primary Service:  pccm Consultants:  cardiolgy , urology Code Status:  full Diet:  Po diet DVT Px:  Hep sq GI Px:  ppi Skin Integrity:  good Social / Family:  None at bedside  BABCOCK,PETE,   04/19/2012, 10:03 AM  Reviewed above, examined pt, and agree with assessment/plan.  Most issues appear to be cardiac at present.  Tentative plan for cardiac cath 10/10.  He will need further assessment for COPD when more stable.  Urology consulted to assess dysuria with hx of prostate cancer.  Coralyn Helling, MD [facility] Pulmonary/Critical Care 04/19/2012, 10:46 AM Pager:  [phone] After 3pm call: [phone]

## 2012-04-19 NOTE — Progress Notes (Signed)
@   Subjective:  Denies CP; dyspnea improving but persists; complains of pain with urination and difficult urinating   Objective:  Filed Vitals:   04/19/12 0400 04/19/12 0500 04/19/12 0600 04/19/12 0654  BP: 141/75 112/58 98/46 119/75  Pulse: 47 41 39 47  Temp:      TempSrc:      Resp: 20 16 21 17   Height:      Weight:      SpO2: 99% 99% 98% 97%    Intake/Output from previous day:  Intake/Output Summary (Last 24 hours) at 04/19/12 0752 Last data filed at 04/19/12 0600  Gross per 24 hour  Intake    120 ml  Output    825 ml  Net   -705 ml    Physical Exam: Physical exam: Well-developed well-nourished in no acute distress.  Skin is warm and dry.  HEENT is normal.  Neck is supple.  Chest with diminished BS throughout Cardiovascular exam is regular rate and rhythm.  Abdominal exam nontender or distended. No masses palpated. Extremities show no edema. neuro grossly intact    Lab Results: Basic Metabolic Panel:  Basename 04/19/12 0321 04/18/12 1605  NA 138 139  K 3.5 3.6  CL 101 103  CO2 25 26  GLUCOSE 104* 111*  BUN 22 17  CREATININE 1.38* 1.14  CALCIUM 8.8 9.4  MG 2.0 2.2  PHOS -- 3.3   CBC:  Basename 04/19/12 0321 04/18/12 1605  WBC 5.9 7.5  NEUTROABS -- 5.1  HGB 13.4 14.5  HCT 39.5 42.4  MCV 92.5 91.4  PLT 191 189   Cardiac Enzymes:  Basename 04/19/12 0321 04/18/12 2117 04/18/12 1605  CKTOTAL -- -- --  CKMB -- -- --  CKMBINDEX -- -- --  TROPONINI <0.30 <0.30 <0.30     Assessment/Plan:  1 Acute systolic CHF-symptoms improving; enzymes negative; CR increased compared to yesterday; will need Right and left cath today but would like to optimize first; lasix 20 mg po today; repeat BMET in AM; proceed with cath in AM if renal function stable. Echo shows severely reduced LV function; change lopressor to po (12.5 mg po BID as patient bradycardic overnight); add ACEI later after cath once it is clear renal function stable. 2 COPD - pulmonary managing 3  UTI/hesitancy - continue antibiotics; urology consult given difficulty urinating and history of prostate cancer 4 CAD - Continue ASA and add statin.  Olga Millers 04/19/2012, 7:52 AM

## 2012-04-19 NOTE — Progress Notes (Signed)
Rec'd call re: Pt had 7 bt run NSVT. No Sx. Will ck K+ and Mg+, supp PRN, otherwise, no changes.

## 2012-04-19 NOTE — Consult Note (Signed)
Urology Consult   Physician requesting consult: Sood  Reason for consult: Pain and hesitancy with urination  History of Present Illness: Lucas [name] is a 66 y.o. with PMH significant for COPD, CAD s/p CABG, and CaP s/p RALP 2011 who was admitted yesterday for eval and tx of chest pain and SOB.  After admission pt complained of pelvic pain and difficulty with urination.  Pt states that he began to have slowing of urine stream and starting/stopping of stream approx 4 months ago.  Prior to that time he was voiding without difficulty.  He is s/p RALP by Dr. Laverle Patter in 2011.  3 weeks ago he started to have mid stream perineal pain during urination which would cause his stream to stop.  He would then dribble and be unable to continue to void despite feelings of incomplete emptying.  He feels as though he has not been emptying his bladder for several months.  He denies dysuria, hematuria, and incontinence.    His last office eval with PSA was 11/2010.  PSA at that time was <0.01. He states he was to be seen tomorrow for his pain during urination.    He is currently eating lunch and comfortable.  His only complaint is of perineal pain during urination.  He denies F/C, CP, SOB, N/V, and diarrhea/constipation.   Past Medical History  Diagnosis Date  . CAD (coronary artery disease)   . Hypertension   . Anxiety   . History of kidney cancer   . ADENOCARCINOMA, PROSTATE   . Adjustment disorder with anxiety   . Hyperlipidemia   . BPH (benign prostatic hypertrophy)   . COPD (chronic obstructive pulmonary disease)   . CAD (coronary artery disease)   . Myocardial infarction   . Shortness of breath     Past Surgical History  Procedure Date  . Coronary artery bypass graft     x 5  . Cardiac catheterization   . Nephrectomy   . Posterior cervical laminectomy     x 8  . Heart stents     x 5    Current Hospital Medications:  Home Meds:  Prior to Admission medications   Medication Sig Start  Date End Date Taking? Authorizing Provider  albuterol (PROVENTIL HFA;VENTOLIN HFA) 108 (90 BASE) MCG/ACT inhaler Inhale 2 puffs into the lungs every 6 (six) hours as needed. Shortness of breath   Yes Historical Provider, MD  ALPRAZolam Prudy Feeler) 1 MG tablet Take 1 mg by mouth at bedtime as needed. anxiety   Yes Historical Provider, MD  doxazosin (CARDURA) 8 MG tablet Take 8 mg by mouth at bedtime.     Yes Historical Provider, MD  HYDROcodone-acetaminophen (NORCO) 10-325 MG per tablet Take 1 tablet by mouth every 6 (six) hours as needed.    Yes Historical Provider, MD  Ipratropium-Albuterol (COMBIVENT RESPIMAT) 20-100 MCG/ACT AERS respimat Inhale 1 puff into the lungs every 8 (eight) hours.   Yes Historical Provider, MD  nebivolol (BYSTOLIC) 10 MG tablet Take 10 mg by mouth daily.   Yes Historical Provider, MD    Scheduled Meds:   . ALPRAZolam  1 mg Oral Once  . ALPRAZolam  1 mg Oral TID  . aspirin  324 mg Oral NOW   Or  . aspirin  300 mg Rectal NOW  . aspirin  324 mg Oral Pre-Cath  . atorvastatin  40 mg Oral q1800  . cefTRIAXone (ROCEPHIN)  IV  1 g Intravenous QHS  . doxazosin  8 mg Oral QHS  .  fentaNYL      . furosemide  40 mg Intravenous Once  . furosemide  20 mg Oral Once  . influenza  inactive virus vaccine  0.5 mL Intramuscular Tomorrow-1000  . Ipratropium-Albuterol  2 puff Inhalation QID  . metoprolol tartrate  12.5 mg Oral BID  . phenazopyridine  100 mg Oral TID WC  . sodium chloride  3 mL Intravenous Q12H  . DISCONTD: albuterol  2.5 mg Nebulization Q6H  . DISCONTD: metoprolol  2.5 mg Intravenous Q6H   Continuous Infusions:  PRN Meds:.sodium chloride, sodium chloride, acetaminophen, ALPRAZolam, fentaNYL, hydrALAZINE, HYDROcodone-acetaminophen, nitroGLYCERIN, ondansetron (ZOFRAN) IV, sodium chloride, DISCONTD: fentaNYL, DISCONTD: metoprolol  Allergies:  Allergies  Allergen Reactions  . Morphine And Related     hallucinations  . Ibuprofen Rash    Tolerates aspirin per  patient.      Family History  Problem Relation Age of Onset  . Diabetes Mother   . Coronary artery disease    . Heart disease Mother   . Heart disease Father   . Heart disease Brother   . Diabetes Father     Social History: Smokes 3 cigarettes per day and has smoked for 50 years.  He drinks 2-3 alcoholic beverages 1-2x per week.   ROS: A complete review of systems was performed.  All systems are negative except for pertinent findings as noted.  Physical Exam:  Vital signs in last 24 hours: Temp:  [97.7 F (36.5 C)-98.9 F (37.2 C)] 97.7 F (36.5 C) (10/09 0820) Pulse Rate:  [39-70] 44  (10/09 1005) Resp:  [13-22] 14  (10/09 1000) BP: (98-170)/(46-108) 117/61 mmHg (10/09 1005) SpO2:  [89 %-100 %] 100 % (10/09 1000) Weight:  [88.1 kg (194 lb 3.6 oz)-94.076 kg (207 lb 6.4 oz)] 88.1 kg (194 lb 3.6 oz) (10/08 1530) General:  Alert and oriented, No acute distress HEENT: Normocephalic, atraumatic Neck: No JVD or lymphadenopathy Cardiovascular: Regular rate and rhythm Lungs: resp clear and unlabored Abdomen: Soft, nontender, nondistended, no abdominal masses GU: erythema right groin fold with no skin breakdown or masses noted; penis and scrotal contents WNL Back: No CVA tenderness Extremities: No edema Neurologic: Grossly intact  Laboratory Data:   Basename 04/19/12 0321 04/18/12 1605  WBC 5.9 7.5  HGB 13.4 14.5  HCT 39.5 42.4  PLT 191 189     Basename 04/19/12 0321 04/18/12 1605  NA 138 139  K 3.5 3.6  CL 101 103  GLUCOSE 104* 111*  BUN 22 17  CALCIUM 8.8 9.4  CREATININE 1.38* 1.14     Results for orders placed during the hospital encounter of 04/18/12 (from the past 24 hour(s))  COMPREHENSIVE METABOLIC PANEL     Status: Abnormal   Collection Time   04/18/12  4:05 PM      Component Value Range   Sodium 139  135 - 145 mEq/L   Potassium 3.6  3.5 - 5.1 mEq/L   Chloride 103  96 - 112 mEq/L   CO2 26  19 - 32 mEq/L   Glucose, Bld 111 (*) 70 - 99 mg/dL   BUN 17   6 - 23 mg/dL   Creatinine, Ser 9.14  0.50 - 1.35 mg/dL   Calcium 9.4  8.4 - 78.2 mg/dL   Total Protein 7.3  6.0 - 8.3 g/dL   Albumin 3.9  3.5 - 5.2 g/dL   AST 22  0 - 37 U/L   ALT 17  0 - 53 U/L   Alkaline Phosphatase 64  39 -  117 U/L   Total Bilirubin 0.3  0.3 - 1.2 mg/dL   GFR calc non Af Amer 65 (*) >90 mL/min   GFR calc Af Amer 76 (*) >90 mL/min  MAGNESIUM     Status: Normal   Collection Time   04/18/12  4:05 PM      Component Value Range   Magnesium 2.2  1.5 - 2.5 mg/dL  PHOSPHORUS     Status: Normal   Collection Time   04/18/12  4:05 PM      Component Value Range   Phosphorus 3.3  2.3 - 4.6 mg/dL  TROPONIN I     Status: Normal   Collection Time   04/18/12  4:05 PM      Component Value Range   Troponin I <0.30  <0.30 ng/mL  PRO B NATRIURETIC PEPTIDE     Status: Abnormal   Collection Time   04/18/12  4:05 PM      Component Value Range   Pro B Natriuretic peptide (BNP) 4150.0 (*) 0 - 125 pg/mL  CBC WITH DIFFERENTIAL     Status: Normal   Collection Time   04/18/12  4:05 PM      Component Value Range   WBC 7.5  4.0 - 10.5 K/uL   RBC 4.64  4.22 - 5.81 MIL/uL   Hemoglobin 14.5  13.0 - 17.0 g/dL   HCT 16.1  09.6 - 04.5 %   MCV 91.4  78.0 - 100.0 fL   MCH 31.3  26.0 - 34.0 pg   MCHC 34.2  30.0 - 36.0 g/dL   RDW 40.9  81.1 - 91.4 %   Platelets 189  150 - 400 K/uL   Neutrophils Relative 68  43 - 77 %   Neutro Abs 5.1  1.7 - 7.7 K/uL   Lymphocytes Relative 19  12 - 46 %   Lymphs Abs 1.4  0.7 - 4.0 K/uL   Monocytes Relative 10  3 - 12 %   Monocytes Absolute 0.8  0.1 - 1.0 K/uL   Eosinophils Relative 3  0 - 5 %   Eosinophils Absolute 0.2  0.0 - 0.7 K/uL   Basophils Relative 1  0 - 1 %   Basophils Absolute 0.0  0.0 - 0.1 K/uL  PROTIME-INR     Status: Normal   Collection Time   04/18/12  4:05 PM      Component Value Range   Prothrombin Time 15.2  11.6 - 15.2 seconds   INR 1.22  0.00 - 1.49  APTT     Status: Normal   Collection Time   04/18/12  4:05 PM      Component  Value Range   aPTT 34  24 - 37 seconds  D-DIMER, QUANTITATIVE     Status: Abnormal   Collection Time   04/18/12  4:05 PM      Component Value Range   D-Dimer, Quant 0.66 (*) 0.00 - 0.48 ug/mL-FEU  MRSA PCR SCREENING     Status: Normal   Collection Time   04/18/12  4:11 PM      Component Value Range   MRSA by PCR NEGATIVE  NEGATIVE  BLOOD GAS, ARTERIAL     Status: Abnormal   Collection Time   04/18/12  4:35 PM      Component Value Range   O2 Content 4.0     Delivery systems NASAL CANNULA     pH, Arterial 7.441  7.350 - 7.450   pCO2 arterial 33.7 (*)  35.0 - 45.0 mmHg   pO2, Arterial 108.0 (*) 80.0 - 100.0 mmHg   Bicarbonate 22.5  20.0 - 24.0 mEq/L   TCO2 19.6  0 - 100 mmol/L   Acid-base deficit 0.4  0.0 - 2.0 mmol/L   O2 Saturation 98.2     Patient temperature 98.6     Collection site RIGHT RADIAL     Drawn by [phone]     Sample type ARTERIAL DRAW     Allens test (pass/fail) PASS  PASS  URINALYSIS, ROUTINE W REFLEX MICROSCOPIC     Status: Abnormal   Collection Time   04/18/12  5:15 PM      Component Value Range   Color, Urine YELLOW  YELLOW   APPearance CLOUDY (*) CLEAR   Specific Gravity, Urine 1.015  1.005 - 1.030   pH 6.5  5.0 - 8.0   Glucose, UA NEGATIVE  NEGATIVE mg/dL   Hgb urine dipstick TRACE (*) NEGATIVE   Bilirubin Urine NEGATIVE  NEGATIVE   Ketones, ur NEGATIVE  NEGATIVE mg/dL   Protein, ur 30 (*) NEGATIVE mg/dL   Urobilinogen, UA 0.2  0.0 - 1.0 mg/dL   Nitrite POSITIVE (*) NEGATIVE   Leukocytes, UA LARGE (*) NEGATIVE  URINE MICROSCOPIC-ADD ON     Status: Abnormal   Collection Time   04/18/12  5:15 PM      Component Value Range   WBC, UA TOO NUMEROUS TO COUNT  <3 WBC/hpf   RBC / HPF 3-6  <3 RBC/hpf   Bacteria, UA MANY (*) RARE  TROPONIN I     Status: Normal   Collection Time   04/18/12  9:17 PM      Component Value Range   Troponin I <0.30  <0.30 ng/mL  TROPONIN I     Status: Normal   Collection Time   04/19/12  3:21 AM      Component Value Range    Troponin I <0.30  <0.30 ng/mL  CBC     Status: Normal   Collection Time   04/19/12  3:21 AM      Component Value Range   WBC 5.9  4.0 - 10.5 K/uL   RBC 4.27  4.22 - 5.81 MIL/uL   Hemoglobin 13.4  13.0 - 17.0 g/dL   HCT 14.7  82.9 - 56.2 %   MCV 92.5  78.0 - 100.0 fL   MCH 31.4  26.0 - 34.0 pg   MCHC 33.9  30.0 - 36.0 g/dL   RDW 13.0  86.5 - 78.4 %   Platelets 191  150 - 400 K/uL  BASIC METABOLIC PANEL     Status: Abnormal   Collection Time   04/19/12  3:21 AM      Component Value Range   Sodium 138  135 - 145 mEq/L   Potassium 3.5  3.5 - 5.1 mEq/L   Chloride 101  96 - 112 mEq/L   CO2 25  19 - 32 mEq/L   Glucose, Bld 104 (*) 70 - 99 mg/dL   BUN 22  6 - 23 mg/dL   Creatinine, Ser 6.96 (*) 0.50 - 1.35 mg/dL   Calcium 8.8  8.4 - 29.5 mg/dL   GFR calc non Af Amer 52 (*) >90 mL/min   GFR calc Af Amer 60 (*) >90 mL/min  MAGNESIUM     Status: Normal   Collection Time   04/19/12  3:21 AM      Component Value Range   Magnesium 2.0  1.5 - 2.5  mg/dL   Recent Results (from the past 240 hour(s))  MRSA PCR SCREENING     Status: Normal   Collection Time   04/18/12  4:11 PM      Component Value Range Status Comment   MRSA by PCR NEGATIVE  NEGATIVE Final     Renal Function:  Basename 04/19/12 0321 04/18/12 1605  CREATININE 1.38* 1.14   Estimated Creatinine Clearance: 58.8 ml/min (by C-G formula based on Cr of 1.38).  Radiologic Imaging: Portable Chest Xray  04/18/2012  *RADIOLOGY REPORT*  Clinical Data: Respiratory distress, chest pressure  PORTABLE CHEST - 1 VIEW  Comparison: Air 11/22/2009  Findings: Mild cardiomegaly.  Mild to moderate diffuse interstitial pattern representing change from the prior study.  No focal consolidation or pleural effusion.  The patient is status post CABG.  IMPRESSION: Findings suggest acute pulmonary edema.   Original Report Authenticated By: Otilio Carpen, M.D.     Impression/Assessment/Plan:  1.) UTI 2.) Pain during urination 3.)  Starting/stopping of urine stream and feelings of incomplete emptying 4.) Prostate cancer s/p RALP 2011 5.) Right tinea cruris  Pain during urination is likely related to his UTI.  His urine has been sent for culture and he is currently receiving Rocephin.  Continue Rocephin until culture results are available for review and treat with appropriate Abx for 5-7 day course.  Will also start Pyridium to see if this gives relief.    His other LUTS are possibly due to a urethral or anastamotic stricture.  He would require a cysto to eval for this.  Will check a PVR and if residual is high will place a foley.   He will need a PSA checked after resolution of his current urinary issues as the value would be falsely elevated at this time.  Start Nystatin powder for right groin.   Silas Flood 04/19/2012, 12:39 PM

## 2012-04-19 NOTE — Progress Notes (Signed)
CARE MANAGEMENT NOTE 04/19/2012  Patient:  DILLION, STOWERS   Account Number:  [id number]  Date Initiated:  04/19/2012  Documentation initiated by:  Jong Rickman  Subjective/Objective Assessment:   pt with strong cardiac hx present to [facility]do with same, sent to ed at [facility], scheduled for rt and left cardiac cath at [facility] [id number]/c/o rectal pain     Action/Plan:   from home/may benefit from a chf home program, patient is receptive to this   Anticipated DC Date:  04/22/2012   Anticipated DC Plan:  HOME/SELF CARE  In-house referral  NA      DC Planning Services  CM consult      PAC Choice  NA   Choice offered to / List presented to:  NA   DME arranged  NA      DME agency  NA     HH arranged  NA      HH agency  NA   Status of service:  In process, will continue to follow Medicare Important Message given?  NA - LOS <3 / Initial given by admissions (If response is "NO", the following Medicare IM given date fields will be blank) Date Medicare IM given:   Date Additional Medicare IM given:    Discharge Disposition:    Per UR Regulation:  Reviewed for med. necessity/level of care/duration of stay  If discussed at Long Length of Stay Meetings, dates discussed:    Comments:  [id number] Earlene Plater, RN, BSN, CCM: CHART REVIEWED AND UPDATED. NO DISCHARGE NEEDS PRESENT AT THIS TIME. CASE MANAGEMENT [phone]

## 2012-04-19 NOTE — Progress Notes (Signed)
Nutrition Brief Note  Patient identified on the Malnutrition Screening Tool (MST) report for unintentional weight loss, generating a score of 4.   Body mass index is 27.87 kg/(m^2). Pt meets criteria for overweight based on current BMI.   Current diet order is low sodium, patient is consuming approximately 75% of meals at this time. Labs and medications reviewed.   Patient reported he is in a lot of pain but he is eating well. He denies any unintentional weight loss and stated he does not weigh himself. Patient was eating lunch meal at time of RD visit, good PO. Patient was without any nutritoin related questions or concerns.   No nutrition interventions warranted at this time. If nutrition issues arise, please consult RD.   Lucas [name] [facility] [phone]

## 2012-04-20 DIAGNOSIS — N181 Chronic kidney disease, stage 1: Secondary | ICD-10-CM | POA: Diagnosis present

## 2012-04-20 DIAGNOSIS — N390 Urinary tract infection, site not specified: Secondary | ICD-10-CM | POA: Diagnosis present

## 2012-04-20 DIAGNOSIS — I5021 Acute systolic (congestive) heart failure: Principal | ICD-10-CM

## 2012-04-20 DIAGNOSIS — J449 Chronic obstructive pulmonary disease, unspecified: Secondary | ICD-10-CM

## 2012-04-20 DIAGNOSIS — R300 Dysuria: Secondary | ICD-10-CM

## 2012-04-20 LAB — BASIC METABOLIC PANEL
Calcium: 8.9 mg/dL (ref 8.4–10.5)
GFR calc Af Amer: 57 mL/min — ABNORMAL LOW (ref >90)
GFR calc non Af Amer: 50 mL/min — ABNORMAL LOW (ref >90)
Potassium: 3.6 mEq/L (ref 3.5–5.1)
Sodium: 138 mEq/L (ref 135–145)

## 2012-04-20 LAB — CBC
Hemoglobin: 13.9 g/dL (ref 13.0–17.0)
MCHC: 32.6 g/dL (ref 30.0–36.0)
Platelets: 188 10*3/uL (ref 150–400)
RBC: 4.6 MIL/uL (ref 4.22–5.81)

## 2012-04-20 LAB — URINE CULTURE: Culture: NO GROWTH

## 2012-04-20 MED ORDER — HEPARIN SODIUM (PORCINE) 5000 UNIT/ML IJ SOLN
5000.0000 [IU] | Freq: Three times a day (TID) | INTRAMUSCULAR | Status: DC
  Administered 2012-04-20 (×2): 5000 [IU] via SUBCUTANEOUS
  Filled 2012-04-20 (×4): qty 1

## 2012-04-20 MED ORDER — SODIUM CHLORIDE 0.9 % IV SOLN: INTRAVENOUS | Status: DC

## 2012-04-20 MED ORDER — OXYCODONE HCL 5 MG PO TABS: 5.0000 mg | ORAL_TABLET | Freq: Four times a day (QID) | ORAL | Status: DC | PRN

## 2012-04-20 MED ORDER — LORAZEPAM 2 MG/ML IJ SOLN
1.0000 mg | Freq: Once | INTRAMUSCULAR | Status: AC
  Administered 2012-04-20: 1 mg via INTRAVENOUS

## 2012-04-20 MED ORDER — LORAZEPAM 2 MG/ML IJ SOLN
INTRAMUSCULAR | Status: AC
  Filled 2012-04-20: qty 1

## 2012-04-20 NOTE — Progress Notes (Signed)
[facility] PROGRESS NOTE  Lucas [name] EAV:[id number] DOB: Aug 16, 1945 DOA: 04/18/2012 PCP: Hayden Rasmussen, NP  Assessment/Plan: Active Problems:  ADENOCARCINOMA, PROSTATE: : Status post resection. Urology signed off.  CORONARY ARTERY DISEASE and  COPD (chronic obstructive pulmonary disease) currently stable  Pulmonary edema, acute: See below  Angina pectoris  Acute respiratory failure with hypoxia  Acute systolic heart failure aggressive diuresis on hold given plans to monitor kidney function and plan for heart cath tomorrow  Dysuria: Have high suspicion that the patient is having kidney stones. No evidence of obstruction. Treat symptomatically and control an outpatient urology followup CKD stage I: Increasing creatinine the last couple of days. Baseline from 1.14 and today it was 1.43. UTI: On Rocephin 3/7. Awaiting cultures and sensitivities  Code Status: Full code Family Communication:  Disposition Plan: Likely discharge to home, hopefully in a few days   Consultants:  [facility] cardiology  [facility]-urology-signed off 10/10  Sood-critical care-transferred patient to hospitalist service on 10/10  Procedures: 10/8 ECHO:The cavity size was mildly dilated. Wall thickness was increased in a pattern of moderate LVH.Systolic function was severely reduced. The estimated ejection fraction was in the range of 25% to 30%. Diffuse hypokinesis. Features are consistent with a pseudonormal left ventricular filling pattern, with concomitant abnormalrelaxation and increased filling pressure (grade 2 diastolic dysfunction).  Antibiotics:  IV Rocephin day 3  HPI/Subjective: 66 year old white male past oral history COPD, prostate cancer status post resection and CAD status post CABG presented on 10/8 [facility] office and was complaining of severe chest pain or shortness of breath and was transferred to the hospital where he was found to be in acute congestive heart  failure. Initially on the critical care service and workup noted an echo with ejection fraction of 25%. Patient was stabilized, diuresed and transferred to the hospitalist service on 10/10.  Patient seen on floor. His main complaint is of pain which he describes rectally, but if closer discussion, is pain in the perineal area and internally behind his testicles which occurs after he urinates. He does note some orange and reddish blood with occasional urination. This is the main complaint of the pain is quite severe at times.   Objective: Filed Vitals:   04/19/12 2126 04/20/12 0525 04/20/12 0855 04/20/12 1456  BP: 122/49 145/75  145/71  Pulse: 43 47  55  Temp: 97.6 F (36.4 C) 97.7 F (36.5 C)  97.7 F (36.5 C)  TempSrc: Oral Oral  Oral  Resp: 16 16  15   Height:      Weight:  94.1 kg (207 lb 7.3 oz)    SpO2: 95% 94% 94% 95%    Intake/Output Summary (Last 24 hours) at 04/20/12 1547 Last data filed at 04/20/12 1300  Gross per 24 hour  Intake    770 ml  Output    225 ml  Net    545 ml   Filed Weights   04/18/12 1530 04/19/12 1520 04/20/12 0525  Weight: 88.1 kg (194 lb 3.6 oz) 91.717 kg (202 lb 3.2 oz) 94.1 kg (207 lb 7.3 oz)    Exam:   General:  Alert and oriented x3, mild distress secondary to pain after recent urinations thousand, fatigued, looks about stated age  Cardiovascular: Regular rate and rhythm, S1-S2, soft 2/6 systolic ejection murmur  Respiratory: Clear to auscultation bilaterally, decreased breath sounds at the bases  Abdomen: Soft, obese, nontender, positive bowel sounds  Extremities: No clubbing or cyanosis, trace pitting edema  Data Reviewed: Basic Metabolic Panel:  Lab  04/20/12 0405 04/19/12 1647 04/19/12 0321 04/18/12 1605  NA 138 -- 138 139  K 3.6 3.8 3.5 3.6  CL 101 -- 101 103  CO2 28 -- 25 26  GLUCOSE 93 -- 104* 111*  BUN 21 -- 22 17  CREATININE 1.43* -- 1.38* 1.14  CALCIUM 8.9 -- 8.8 9.4  MG -- 2.0 2.0 2.2  PHOS -- -- -- 3.3   Liver  Function Tests:  Lab 04/18/12 1605  AST 22  ALT 17  ALKPHOS 64  BILITOT 0.3  PROT 7.3  ALBUMIN 3.9   CBC:  Lab 04/20/12 0405 04/19/12 0321 04/18/12 1605  WBC 6.4 5.9 7.5  NEUTROABS -- -- 5.1  HGB 13.9 13.4 14.5  HCT 42.6 39.5 42.4  MCV 92.6 92.5 91.4  PLT 188 191 189   Cardiac Enzymes:  Lab 04/19/12 0321 04/18/12 2117 04/18/12 1605  CKTOTAL -- -- --  CKMB -- -- --  CKMBINDEX -- -- --  TROPONINI <0.30 <0.30 <0.30   BNP (last 3 results)  Basename 04/18/12 1605  PROBNP 4150.0*     Recent Results (from the past 240 hour(s))  MRSA PCR SCREENING     Status: Normal   Collection Time   04/18/12  4:11 PM      Component Value Range Status Comment   MRSA by PCR NEGATIVE  NEGATIVE Final   URINE CULTURE     Status: Normal   Collection Time   04/19/12  4:38 AM      Component Value Range Status Comment   Specimen Description URINE, CLEAN CATCH   Final    Special Requests NONE   Final    Culture  Setup Time 04/19/2012 08:37   Final    Colony Count NO GROWTH   Final    Culture NO GROWTH   Final    Report Status 04/20/2012 FINAL   Final      Studies: Portable Chest Xray  04/18/2012  radiation panic attack  IMPRESSION: Findings suggest acute pulmonary edema.   Original Report Authenticated By: Otilio Carpen, M.D.     Scheduled Meds:   . ALPRAZolam  1 mg Oral TID  . aspirin  324 mg Oral Pre-Cath  . atorvastatin  40 mg Oral q1800  . cefTRIAXone (ROCEPHIN)  IV  1 g Intravenous QHS  . doxazosin  8 mg Oral QHS  . fentaNYL      . heparin subcutaneous  5,000 Units Subcutaneous Q8H  . influenza  inactive virus vaccine  0.5 mL Intramuscular Tomorrow-1000  . Ipratropium-Albuterol  2 puff Inhalation QID  . metoprolol tartrate  12.5 mg Oral BID  . nystatin   Topical BID  . phenazopyridine  100 mg Oral TID WC  . sodium chloride  3 mL Intravenous Q12H   Continuous Infusions:   . sodium chloride      Active Problems:  ADENOCARCINOMA, PROSTATE  CORONARY ARTERY  DISEASE  COPD (chronic obstructive pulmonary disease)  Pulmonary edema, acute  Angina pectoris  Acute respiratory failure with hypoxia  Acute systolic heart failure  Dysuria    Time spent:  40 minutes    Hollice Espy  [facility] Pager [phone]. If 8PM-8AM, please contact night-coverage at [email or web address], password [facility] 04/20/2012, 3:47 PM  LOS: 2 days

## 2012-04-20 NOTE — Progress Notes (Signed)
  Subjective: Patient reports voiding well. For cardiac cath in AM.   Objective: Vital signs in last 24 hours: Temp:  [97.6 F (36.4 C)-98.1 F (36.7 C)] 97.7 F (36.5 C) (10/10 0525) Pulse Rate:  [43-51] 47  (10/10 0525) Resp:  [15-16] 16  (10/10 0525) BP: (122-150)/(49-76) 145/75 mmHg (10/10 0525) SpO2:  [94 %-98 %] 94 % (10/10 0855) Weight:  [91.717 kg (202 lb 3.2 oz)-94.1 kg (207 lb 7.3 oz)] 94.1 kg (207 lb 7.3 oz) (10/10 0525)A  Intake/Output from previous day: 10/09 0701 - 10/10 0700 In: 920 [P.O.:840; I.V.:30; IV Piggyback:50] Out: 375 [Urine:375] Intake/Output this shift:    Past Medical History  Diagnosis Date  . CAD (coronary artery disease)   . Hypertension   . Anxiety   . History of kidney cancer   . ADENOCARCINOMA, PROSTATE   . Adjustment disorder with anxiety   . Hyperlipidemia   . BPH (benign prostatic hypertrophy)   . COPD (chronic obstructive pulmonary disease)   . CAD (coronary artery disease)   . Myocardial infarction   . Shortness of breath     Physical Exam:  General: wdwnwm in NAD.  Lungs - Normal respiratory effort, chest expands symmetrically.  Abdomen - Soft, non-tender & non-distended.  Lab Results:  Basename 04/20/12 0405 04/19/12 0321 04/18/12 1605  WBC 6.4 5.9 7.5  HGB 13.9 13.4 14.5  HCT 42.6 39.5 42.4   BMET  Basename 04/20/12 0405 04/19/12 1647 04/19/12 0321  NA 138 -- 138  K 3.6 3.8 --  CL 101 -- 101  CO2 28 -- 25  GLUCOSE 93 -- 104*  BUN 21 -- 22  CREATININE 1.43* -- 1.38*  CALCIUM 8.9 -- 8.8   No results found for this basename: LABURIN:1 in the last 72 hours Results for orders placed during the hospital encounter of 04/18/12  MRSA PCR SCREENING     Status: Normal   Collection Time   04/18/12  4:11 PM      Component Value Range Status Comment   MRSA by PCR NEGATIVE  NEGATIVE Final   URINE CULTURE     Status: Normal   Collection Time   04/19/12  4:38 AM      Component Value Range Status Comment   Specimen  Description URINE, CLEAN CATCH   Final    Special Requests NONE   Final    Culture  Setup Time 04/19/2012 08:37   Final    Colony Count NO GROWTH   Final    Culture NO GROWTH   Final    Report Status 04/20/2012 FINAL   Final     Studies/Results: @[id number] @  Assessment/Plan: Pt being treated for UTI, and CHF. He is to have cardiac cath tomorrow, but is insistent that he doesn't want to have a foley catheter placed. Will continue to follow with you.    Lucas [name] I 04/20/2012, 11:59 AM

## 2012-04-20 NOTE — Progress Notes (Signed)
@   Subjective:  Denies CP; dyspnea much improved   Objective:  Filed Vitals:   04/19/12 1520 04/19/12 2044 04/19/12 2126 04/20/12 0525  BP: 150/76  122/49 145/75  Pulse: 49  43 47  Temp: 97.9 F (36.6 C)  97.6 F (36.4 C) 97.7 F (36.5 C)  TempSrc: Oral  Oral Oral  Resp: 15  16 16   Height: 5\' 10"  (1.778 m)     Weight: 202 lb 3.2 oz (91.717 kg)   207 lb 7.3 oz (94.1 kg)  SpO2: 97% 96% 95% 94%    Intake/Output from previous day:  Intake/Output Summary (Last 24 hours) at 04/20/12 0713 Last data filed at 04/20/12 0600  Gross per 24 hour  Intake    920 ml  Output    375 ml  Net    545 ml    Physical Exam: Physical exam: Well-developed well-nourished in no acute distress.  Skin is warm and dry.  HEENT is normal.  Neck is supple.  Chest with diminished BS throughout Cardiovascular exam is regular rate and rhythm.  Abdominal exam nontender or distended. No masses palpated. Extremities show no edema. neuro grossly intact    Lab Results: Basic Metabolic Panel:  Basename 04/20/12 0405 04/19/12 1647 04/19/12 0321 04/18/12 1605  NA 138 -- 138 --  K 3.6 3.8 -- --  CL 101 -- 101 --  CO2 28 -- 25 --  GLUCOSE 93 -- 104* --  BUN 21 -- 22 --  CREATININE 1.43* -- 1.38* --  CALCIUM 8.9 -- 8.8 --  MG -- 2.0 2.0 --  PHOS -- -- -- 3.3   CBC:  Basename 04/20/12 0405 04/19/12 0321 04/18/12 1605  WBC 6.4 5.9 --  NEUTROABS -- -- 5.1  HGB 13.9 13.4 --  HCT 42.6 39.5 --  MCV 92.6 92.5 --  PLT 188 191 --   Cardiac Enzymes:  Basename 04/19/12 0321 04/18/12 2117 04/18/12 1605  CKTOTAL -- -- --  CKMB -- -- --  CKMBINDEX -- -- --  TROPONINI <0.30 <0.30 <0.30     Assessment/Plan:  1 Acute systolic CHF-symptoms improving; enzymes negative; CR increased compared to yesterday; will need Right and left cath today but need for renal function to stabilize first; gentle hydration this PM; hold on further diuresis; repeat BMET in AM and proceed with cath if renal function stable or  improving (no Vgram; follow renal function closely after procedure). Echo shows severely reduced LV function; continue lopressor 12.5 mg po BID; will not advance as patient bradycardic overnight; add ACEI later after cath once it is clear renal function stable. 2 COPD - pulmonary managing 3 UTI/hesitancy - continue antibiotics; urology following 4 CAD - Continue ASA and statin.  Olga Millers 04/20/2012, 7:13 AM

## 2012-04-21 ENCOUNTER — Emergency Department (HOSPITAL_COMMUNITY): Payer: Medicare Other

## 2012-04-21 ENCOUNTER — Inpatient Hospital Stay (HOSPITAL_COMMUNITY)
Admission: EM | Admit: 2012-04-21 | Discharge: 2012-04-25 | DRG: 286 | Disposition: A | Discharge status: Home / Self Care | Payer: Medicare Other | Attending: Internal Medicine | Admitting: Internal Medicine

## 2012-04-21 ENCOUNTER — Telehealth: Payer: Self-pay | Admitting: *Deleted

## 2012-04-21 ENCOUNTER — Encounter (HOSPITAL_COMMUNITY): Payer: Self-pay | Admitting: Emergency Medicine

## 2012-04-21 DIAGNOSIS — J449 Chronic obstructive pulmonary disease, unspecified: Secondary | ICD-10-CM

## 2012-04-21 DIAGNOSIS — Z833 Family history of diabetes mellitus: Secondary | ICD-10-CM

## 2012-04-21 DIAGNOSIS — I2510 Atherosclerotic heart disease of native coronary artery without angina pectoris: Secondary | ICD-10-CM

## 2012-04-21 DIAGNOSIS — N181 Chronic kidney disease, stage 1: Secondary | ICD-10-CM | POA: Diagnosis present

## 2012-04-21 DIAGNOSIS — F4322 Adjustment disorder with anxiety: Secondary | ICD-10-CM | POA: Diagnosis present

## 2012-04-21 DIAGNOSIS — I10 Essential (primary) hypertension: Secondary | ICD-10-CM | POA: Diagnosis present

## 2012-04-21 DIAGNOSIS — I509 Heart failure, unspecified: Secondary | ICD-10-CM | POA: Diagnosis present

## 2012-04-21 DIAGNOSIS — N390 Urinary tract infection, site not specified: Secondary | ICD-10-CM | POA: Diagnosis present

## 2012-04-21 DIAGNOSIS — J4489 Other specified chronic obstructive pulmonary disease: Secondary | ICD-10-CM | POA: Diagnosis present

## 2012-04-21 DIAGNOSIS — F17200 Nicotine dependence, unspecified, uncomplicated: Secondary | ICD-10-CM | POA: Diagnosis present

## 2012-04-21 DIAGNOSIS — N401 Enlarged prostate with lower urinary tract symptoms: Secondary | ICD-10-CM | POA: Diagnosis present

## 2012-04-21 DIAGNOSIS — I5021 Acute systolic (congestive) heart failure: Secondary | ICD-10-CM

## 2012-04-21 DIAGNOSIS — I129 Hypertensive chronic kidney disease with stage 1 through stage 4 chronic kidney disease, or unspecified chronic kidney disease: Secondary | ICD-10-CM | POA: Diagnosis present

## 2012-04-21 DIAGNOSIS — C61 Malignant neoplasm of prostate: Secondary | ICD-10-CM | POA: Diagnosis present

## 2012-04-21 DIAGNOSIS — R079 Chest pain, unspecified: Secondary | ICD-10-CM

## 2012-04-21 DIAGNOSIS — R3911 Hesitancy of micturition: Secondary | ICD-10-CM | POA: Diagnosis present

## 2012-04-21 DIAGNOSIS — J9601 Acute respiratory failure with hypoxia: Secondary | ICD-10-CM

## 2012-04-21 DIAGNOSIS — R413 Other amnesia: Secondary | ICD-10-CM

## 2012-04-21 DIAGNOSIS — E785 Hyperlipidemia, unspecified: Secondary | ICD-10-CM | POA: Diagnosis present

## 2012-04-21 DIAGNOSIS — Z8249 Family history of ischemic heart disease and other diseases of the circulatory system: Secondary | ICD-10-CM

## 2012-04-21 DIAGNOSIS — N138 Other obstructive and reflux uropathy: Secondary | ICD-10-CM | POA: Diagnosis present

## 2012-04-21 DIAGNOSIS — J9600 Acute respiratory failure, unspecified whether with hypoxia or hypercapnia: Secondary | ICD-10-CM | POA: Diagnosis present

## 2012-04-21 DIAGNOSIS — Z951 Presence of aortocoronary bypass graft: Secondary | ICD-10-CM

## 2012-04-21 LAB — URINE MICROSCOPIC-ADD ON

## 2012-04-21 LAB — URINALYSIS, ROUTINE W REFLEX MICROSCOPIC
Nitrite: POSITIVE — AB
Protein, ur: NEGATIVE mg/dL
Specific Gravity, Urine: 1.011 (ref 1.005–1.030)
Urobilinogen, UA: 0.2 mg/dL (ref 0.0–1.0)

## 2012-04-21 LAB — BASIC METABOLIC PANEL
CO2: 25 mEq/L (ref 19–32)
Chloride: 100 mEq/L (ref 96–112)
Creatinine, Ser: 1.21 mg/dL (ref 0.50–1.35)
GFR calc Af Amer: 70 mL/min — ABNORMAL LOW (ref >90)
Potassium: 3.7 mEq/L (ref 3.5–5.1)

## 2012-04-21 LAB — CBC
HCT: 43.3 % (ref 39.0–52.0)
MCV: 91.5 fL (ref 78.0–100.0)
Platelets: 170 10*3/uL (ref 150–400)
RBC: 4.73 MIL/uL (ref 4.22–5.81)
WBC: 5.8 10*3/uL (ref 4.0–10.5)

## 2012-04-21 LAB — TROPONIN I: Troponin I: <0.3 ng/mL (ref <0.30)

## 2012-04-21 LAB — RAPID URINE DRUG SCREEN, HOSP PERFORMED
Cocaine: NOT DETECTED
Opiates: POSITIVE — AB

## 2012-04-21 LAB — PRO B NATRIURETIC PEPTIDE: Pro B Natriuretic peptide (BNP): 2641 pg/mL — ABNORMAL HIGH (ref 0–125)

## 2012-04-21 SURGERY — LEFT HEART CATHETERIZATION WITH CORONARY ANGIOGRAM
Anesthesia: LOCAL | Laterality: Bilateral

## 2012-04-21 MED ORDER — SODIUM CHLORIDE 0.9 % IJ SOLN: 3.0000 mL | INTRAMUSCULAR | Status: DC | PRN

## 2012-04-21 MED ORDER — ENOXAPARIN SODIUM 30 MG/0.3ML ~~LOC~~ SOLN
30.0000 mg | SUBCUTANEOUS | Status: DC
  Administered 2012-04-21 – 2012-04-24 (×4): 30 mg via SUBCUTANEOUS
  Filled 2012-04-21 (×5): qty 0.3

## 2012-04-21 MED ORDER — NITROGLYCERIN 0.4 MG SL SUBL
0.4000 mg | SUBLINGUAL_TABLET | SUBLINGUAL | Status: AC | PRN
  Administered 2012-04-21 – 2012-04-22 (×4): 0.4 mg via SUBLINGUAL
  Filled 2012-04-21: qty 25

## 2012-04-21 MED ORDER — ATORVASTATIN CALCIUM 40 MG PO TABS
40.0000 mg | ORAL_TABLET | Freq: Every day | ORAL | Status: DC
  Filled 2012-04-21 (×5): qty 1

## 2012-04-21 MED ORDER — HYDROMORPHONE HCL PF 1 MG/ML IJ SOLN
1.0000 mg | Freq: Once | INTRAMUSCULAR | Status: AC
  Administered 2012-04-21: 1 mg via INTRAVENOUS
  Filled 2012-04-21: qty 1

## 2012-04-21 MED ORDER — SODIUM CHLORIDE 0.9 % IV SOLN: 250.0000 mL | INTRAVENOUS | Status: DC | PRN

## 2012-04-21 MED ORDER — DOXAZOSIN MESYLATE 8 MG PO TABS
8.0000 mg | ORAL_TABLET | Freq: Every day | ORAL | Status: DC
  Administered 2012-04-22 – 2012-04-24 (×4): 8 mg via ORAL
  Filled 2012-04-21 (×6): qty 1

## 2012-04-21 MED ORDER — LEVALBUTEROL HCL 0.63 MG/3ML IN NEBU
0.6300 mg | INHALATION_SOLUTION | Freq: Four times a day (QID) | RESPIRATORY_TRACT | Status: DC
  Administered 2012-04-21 – 2012-04-23 (×9): 0.63 mg via RESPIRATORY_TRACT
  Filled 2012-04-21 (×11): qty 3

## 2012-04-21 MED ORDER — ACETAMINOPHEN 325 MG PO TABS: 650.0000 mg | ORAL_TABLET | ORAL | Status: DC | PRN

## 2012-04-21 MED ORDER — FUROSEMIDE 10 MG/ML IJ SOLN
20.0000 mg | Freq: Two times a day (BID) | INTRAMUSCULAR | Status: DC
  Administered 2012-04-21 – 2012-04-23 (×4): 20 mg via INTRAVENOUS
  Filled 2012-04-21 (×8): qty 2

## 2012-04-21 MED ORDER — ALBUTEROL SULFATE (5 MG/ML) 0.5% IN NEBU: 2.5000 mg | INHALATION_SOLUTION | RESPIRATORY_TRACT | Status: AC | PRN

## 2012-04-21 MED ORDER — HYDROMORPHONE HCL PF 1 MG/ML IJ SOLN
1.0000 mg | INTRAMUSCULAR | Status: AC | PRN
  Administered 2012-04-21 (×2): 1 mg via INTRAVENOUS
  Filled 2012-04-21 (×2): qty 1

## 2012-04-21 MED ORDER — ONDANSETRON HCL 4 MG/2ML IJ SOLN: 4.0000 mg | Freq: Four times a day (QID) | INTRAMUSCULAR | Status: DC | PRN

## 2012-04-21 MED ORDER — NEBIVOLOL HCL 10 MG PO TABS
10.0000 mg | ORAL_TABLET | Freq: Every day | ORAL | Status: DC
  Filled 2012-04-21: qty 1

## 2012-04-21 MED ORDER — SODIUM CHLORIDE 0.9 % IV BOLUS (SEPSIS)
250.0000 mL | Freq: Once | INTRAVENOUS | Status: AC
  Administered 2012-04-21: 250 mL via INTRAVENOUS

## 2012-04-21 MED ORDER — ONDANSETRON HCL 4 MG/2ML IJ SOLN: 4.0000 mg | Freq: Three times a day (TID) | INTRAMUSCULAR | Status: AC | PRN

## 2012-04-21 MED ORDER — ASPIRIN 325 MG PO TABS
325.0000 mg | ORAL_TABLET | Freq: Every day | ORAL | Status: DC
  Filled 2012-04-21 (×5): qty 1

## 2012-04-21 MED ORDER — SODIUM CHLORIDE 0.9 % IJ SOLN
3.0000 mL | Freq: Two times a day (BID) | INTRAMUSCULAR | Status: DC
  Administered 2012-04-22 – 2012-04-23 (×4): 3 mL via INTRAVENOUS

## 2012-04-21 MED ORDER — NITROGLYCERIN 0.4 MG SL SUBL
0.4000 mg | SUBLINGUAL_TABLET | SUBLINGUAL | Status: DC | PRN
  Administered 2012-04-21: 0.4 mg via SUBLINGUAL
  Filled 2012-04-21: qty 25

## 2012-04-21 MED ORDER — SODIUM CHLORIDE 0.9 % IJ SOLN
3.0000 mL | Freq: Two times a day (BID) | INTRAMUSCULAR | Status: DC
  Administered 2012-04-22 – 2012-04-23 (×2): 3 mL via INTRAVENOUS
  Administered 2012-04-23: 22:00:00 via INTRAVENOUS
  Administered 2012-04-24 – 2012-04-25 (×2): 3 mL via INTRAVENOUS

## 2012-04-21 MED ORDER — METOPROLOL TARTRATE 12.5 MG HALF TABLET
12.5000 mg | ORAL_TABLET | Freq: Two times a day (BID) | ORAL | Status: DC
  Administered 2012-04-21 – 2012-04-25 (×8): 12.5 mg via ORAL
  Filled 2012-04-21 (×9): qty 1

## 2012-04-21 NOTE — ED Notes (Signed)
Admitting MD at bedside.

## 2012-04-21 NOTE — Telephone Encounter (Signed)
LEFT MESSAGE  FOR PT TO CALL BACK  PER DR NISHAN PT NEEDS TO BE SEEN NEXT WEEK  WITH MD OR PA  NEEDS TO BE SET UP  FOR R AND L HEART CATH .Zack Seal

## 2012-04-21 NOTE — ED Notes (Signed)
Pt arrived to ED stating does not remember how he got home from hospital- states woke up today with "red socks" on and at his house. Also states was supposed to have heart cath yesterday. Does not remember having cath or being discharged. Drove self to hospital

## 2012-04-21 NOTE — Progress Notes (Signed)
Patient Name: Lucas [name] Date of Encounter: 04/21/2012     Active Problems:  ADENOCARCINOMA, PROSTATE  HYPERTENSION  CORONARY ARTERY DISEASE  COPD (chronic obstructive pulmonary disease)  Acute systolic heart failure  UTI (urinary tract infection)    SUBJECTIVE:  Patient was initially consulted on 04/16/12 for acute systolic CHF. He has a history of CAD s/p CABG x4. Most recent cath in 2008 revealed 3/5 graft occlusions previously documented. SVG-OM2 and LIMA-LAD patent. LVEF 50-55%, apical HK. He was started on IV diuretics and 2D echo revealed LVEF 25-30%, moderate LVH, mild LV dilatation, diffuse LV HK, grade 2 diastolic dysfunction, mild LA dilatation. He diuresed well and plan was for [facility] the following day, but Cr increased and this was postponed to allow for the patient's renal function to stabilize. He was scheduled for cath today, but left AMA last night. He awoke today back home having not realized what happened, and drove back to the ED today. There is a question of AMS secondary to Ativan given last night.   In the ED, BMET reveals improved renal function (BUN/Cr 20/1.21), unremarkable CBC, trop-I WNL and elevated pBNP at 2641. EKG without ischemic changes. CXR reveals pulmonary vascular prominence superimposed on chronic lung changes.  UDS + benz, opiates and THC (no recent prior UDS for comparison). He currently endorses intermittent chest discomfort relieved by Dilaudid. Allergy to morphine- hallucinations. Intolerant of NTG- headaches. He is apologetic stating that his not typically his behavior and does not remember how he got home last night.  OBJECTIVE  Filed Vitals:   04/21/12 1150 04/21/12 1234 04/21/12 1421  BP: 174/76 145/77 142/63  Pulse:  72 75  Temp:  99.2 F (37.3 C) 98.9 F (37.2 C)  TempSrc:  Oral Oral  Resp: 14 18 19   SpO2: 96% 98% 97%   No intake or output data in the 24 hours ending 04/21/12 1510 Weight change:   PHYSICAL EXAM  General:  Well developed, well nourished, in no acute distress. Head: Normocephalic, atraumatic, sclera non-icteric, no xanthomas, nares are without discharge.  Neck: Supple without bruits or JVD. Lungs:  Resp regular and unlabored, CTAB without wheezes, rales or rhonchi Heart: RRR no s3, s4, or murmurs. Abdomen: Soft, non-tender, non-distended, LUQ soft tissue abnormality associated with scar, BS + x 4.  Msk:  Strength and tone appears normal for age. Extremities: No clubbing, cyanosis or edema. DP/PT/Radials 2+ and equal bilaterally. Neuro: Alert and oriented X 3. Moves all extremities spontaneously. Psych: Normal affect.  LABS:  Recent Labs  [facility] 04/21/12 1136 04/20/12 0405   WBC 5.8 6.4   HGB 14.8 13.9   HCT 43.3 42.6   MCV 91.5 92.6   PLT 170 188   Recent Labs  Basename 04/18/12 1605   DDIMER 0.66*    Lab 04/21/12 1136 04/20/12 0405 04/19/12 1647 04/19/12 0321 04/18/12 1605  NA 135 138 -- 138 --  K 3.7 3.6 3.8 -- --  CL 100 101 -- 101 --  CO2 25 28 -- 25 --  BUN 20 21 -- 22 --  CREATININE 1.21 1.43* -- 1.38* --  CALCIUM 9.2 8.9 -- 8.8 --  PROT -- -- -- -- 7.3  BILITOT -- -- -- -- 0.3  ALKPHOS -- -- -- -- 64  ALT -- -- -- -- 17  AST -- -- -- -- 22  AMYLASE -- -- -- -- --  LIPASE -- -- -- -- --  GLUCOSE 95 93 -- 104* --   Recent Labs  Basename 04/21/12 1136 04/19/12 0321 04/18/12 2117   CKTOTAL -- -- --   CKMB -- -- --   CKMBINDEX -- -- --   TROPONINI <0.30 <0.30 <0.30   Basename 04/18/12 1610  TSH 2.018  T4TOTAL --  T3FREE --  THYROIDAB --    ECG: NSR, 70 bpm, Q waves V1, V2, no ST/T wave changes, IVCD  Radiology/Studies:  Dg Chest Port 1 View  04/21/2012  *RADIOLOGY REPORT*  Clinical Data: Chest pain.  Altered mental status.  PORTABLE CHEST - 1 VIEW  Comparison: 04/18/2012 and 11/22/2009.  Findings: Post CABG.  Pulmonary vascular prominence superimposed on chronic changes.  Calcified tortuous aorta.  No infiltrate or pneumothorax.  IMPRESSION: Pulmonary  vascular prominence superimposed upon chronic lung changes.  Tortuous aorta.  Post CABG.  Slight indistinctness left heart border unchanged compared to 2011 exam.   Original Report Authenticated By: Fuller Canada, M.D.    Portable Chest Xray  04/18/2012  *RADIOLOGY REPORT*  Clinical Data: Respiratory distress, chest pressure  PORTABLE CHEST - 1 VIEW  Comparison: Air 11/22/2009  Findings: Mild cardiomegaly.  Mild to moderate diffuse interstitial pattern representing change from the prior study.  No focal consolidation or pleural effusion.  The patient is status post CABG.  IMPRESSION: Findings suggest acute pulmonary edema.   Original Report Authenticated By: Otilio Carpen, M.D.     Current Medications:     .  HYDROmorphone (DILAUDID) injection  1 mg Intravenous Once  . sodium chloride  250 mL Intravenous Once    ASSESSMENT:  1. Acute systolic CHF 2. CAD 3. HTN 4. UTI 5. COPD 6. Prostate cancer s/p resection  DISCUSSION/PLAN:  Continue previous plan. Discussed with Dr. Daleen Squibb. Schedule R/LHC for Monday. He is agreeable. Continue ASA and statin. Will re-start only low-dose Lopressor given evidence of bradycardia overnight. Hold on ACEi for now given potential for contrast-induced nephropathy post-cath. Fluid status and renal function will need to be monitored closely over the weekend. 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 Placed on board for Monday. Will enter pre-cath orders. Otherwise euvolemic on exam. Still endorsing intermittent chest pain. Continue outpatient Norco PRN and NTG PRN. Hold off on morphine for pain relief.   Signed, R. Hurman Horn, PA-C 04/21/2012, 3:10 PM Patient examined and agree. For right and left heart cath on Monday. No change in meds.  Valera Castle, MD 04/22/2012 2:15 PM

## 2012-04-21 NOTE — ED Provider Notes (Signed)
History     CSN: [id number]  Arrival date & time 04/21/12  1042   First MD Initiated Contact with Patient 04/21/12 1142      Chief Complaint  Patient presents with  . Altered Mental Status  . Chest Pain    (Consider location/radiation/quality/duration/timing/severity/associated sxs/prior treatment) HPI Comments: Lucas [name] 66 y.o. male   The chief complaint is: Patient presents with:   Altered Mental Status   Chest Pain    66 year old male with a complicated past medical history including COPD, CAD, previous MI, multiple heart surgeries, presents today with chief complaint of chest pain, and amnestic event. States that he was admitted 4 days ago to the hospital, was supposed to have a cardiac catheterization today. He awoke this morning on his couch in [place] and did not know how he got home. He woke with chest pain, and in a cold sweat. Pain was rated at a 7/10. Described as left-sided pressure. Does not radiate. Plus nausea, no vomiting, no epigastric pain. + SOB. Pain is currently 2/10 and no SOB.  Denies unilateral weakness, aphasia,dystaxia, difficulty swallowing,visual disturbance or lightheadedness. +mild headache. Patient also c/o left sided LBP.  Denies urinary sxs.   Chart review shows admission for CHF exacerbation, angina, COPD, UTI. Echo showed LV hypokinessis and filling defect with EF at 25-30%.   Patient is a 66 y.o. male presenting with altered mental status and chest pain. The history is provided by the patient and medical records. No language interpreter was used.  Altered Mental Status This is a new problem. The current episode started yesterday. The problem has been resolved. Associated symptoms include chest pain, coughing (chronic, copd), diaphoresis, headaches (mild), nausea and a rash (groin). Pertinent negatives include no abdominal pain, anorexia, arthralgias, change in bowel habit, chills, congestion, fatigue, fever, joint swelling,  myalgias, neck pain, numbness, sore throat, swollen glands, urinary symptoms, vertigo, visual change, vomiting or weakness.  Chest Pain The chest pain began 3 - 5 hours ago. Chest pain occurs constantly. The chest pain is improving. At its most intense, the pain is at 6/10. The pain is currently at 2/10. The severity of the pain is moderate. The quality of the pain is described as aching and heavy. The pain does not radiate. Primary symptoms include shortness of breath, cough (chronic, copd), nausea and altered mental status. Pertinent negatives for primary symptoms include no fever, no fatigue, no syncope, no wheezing, no palpitations, no abdominal pain, no vomiting and no dizziness.  Associated symptoms include diaphoresis.  Pertinent negatives for associated symptoms include no claudication, no lower extremity edema, no near-syncope, no numbness, no orthopnea, no paroxysmal nocturnal dyspnea and no weakness. He tried nothing for the symptoms.  His past medical history is significant for CAD, cancer, COPD, CHF, hyperlipidemia and hypertension.  Pertinent negatives for past medical history include no aneurysm, no anxiety/panic attacks, no aortic aneurysm, no aortic dissection, no arrhythmia, no bicuspid aortic valve, no congenital heart disease, no connective tissue disease, no diabetes, no DVT, no hyperhomocysteinemia, no Kawasaki disease, no Marfan's syndrome, no MI, no mitral valve prolapse, no pacemaker, no PE, no PVD, no recent injury, no rheumatic fever, no seizures, no sickle cell disease, no sleep apnea, no spontaneous pneumothorax, no stimulant use, no strokes, no thyroid problem, no TIA, Turner syndrome and no valve disorder.  Procedure history is positive for cardiac catheterization and echocardiogram.     Past Medical History  Diagnosis Date  . CAD (coronary artery disease)   .  Hypertension   . Anxiety   . History of kidney cancer   . ADENOCARCINOMA, PROSTATE   . Adjustment disorder  with anxiety   . Hyperlipidemia   . BPH (benign prostatic hypertrophy)   . COPD (chronic obstructive pulmonary disease)   . CAD (coronary artery disease)   . Myocardial infarction   . Shortness of breath     Past Surgical History  Procedure Date  . Coronary artery bypass graft     x 5  . Cardiac catheterization   . Nephrectomy   . Posterior cervical laminectomy     x 8  . Heart stents     x 5    Family History  Problem Relation Age of Onset  . Diabetes Mother   . Coronary artery disease    . Heart disease Mother   . Heart disease Father   . Heart disease Brother   . Diabetes Father     History  Substance Use Topics  . Smoking status: Current Some Day Smoker -- 0.3 packs/day for 54 years    Types: Cigarettes  . Smokeless tobacco: Never Used  . Alcohol Use: No      Review of Systems  Constitutional: Positive for diaphoresis. Negative for fever, chills and fatigue.  HENT: Negative for congestion, sore throat and neck pain.   Eyes: Positive for visual disturbance.  Respiratory: Positive for cough (chronic, copd) and shortness of breath. Negative for wheezing.   Cardiovascular: Positive for chest pain. Negative for palpitations, orthopnea, claudication, leg swelling, syncope and near-syncope.  Gastrointestinal: Positive for nausea. Negative for vomiting, abdominal pain, diarrhea, constipation, anorexia and change in bowel habit.  Genitourinary: Positive for flank pain. Negative for dysuria and hematuria.  Musculoskeletal: Negative for myalgias, joint swelling and arthralgias.  Skin: Positive for rash (groin).  Neurological: Positive for headaches (mild). Negative for dizziness, vertigo, seizures, weakness and numbness.  Psychiatric/Behavioral: Positive for altered mental status.  All other systems reviewed and are negative.    Allergies  Morphine and related and Ibuprofen  Home Medications   Current Outpatient Rx  Name Route Sig Dispense Refill  . ALBUTEROL  SULFATE HFA 108 (90 BASE) MCG/ACT IN AERS Inhalation Inhale 2 puffs into the lungs every 6 (six) hours as needed. Shortness of breath    . ALPRAZOLAM 1 MG PO TABS Oral Take 1 mg by mouth at bedtime as needed. anxiety    . DOXAZOSIN MESYLATE 8 MG PO TABS Oral Take 8 mg by mouth at bedtime.      Marland Kitchen HYDROCODONE-ACETAMINOPHEN 10-325 MG PO TABS Oral Take 1 tablet by mouth every 6 (six) hours as needed. For pain.    . IPRATROPIUM-ALBUTEROL 20-100 MCG/ACT IN AERS Inhalation Inhale 1 puff into the lungs every 8 (eight) hours.    . NEBIVOLOL HCL 10 MG PO TABS Oral Take 10 mg by mouth daily.      BP 174/76  Resp 14  SpO2 96%  Physical Exam  Nursing note and vitals reviewed. Constitutional: He is oriented to person, place, and time. He appears well-developed and well-nourished. No distress.  HENT:  Head: Normocephalic and atraumatic.  Eyes: Conjunctivae normal and EOM are normal. Pupils are equal, round, and reactive to light. No scleral icterus.  Neck: Normal range of motion. Neck supple. No JVD present. No thyromegaly present.  Cardiovascular: Normal rate, regular rhythm and normal heart sounds.   Pulmonary/Chest: Effort normal and breath sounds normal. No respiratory distress. He has no wheezes. He exhibits no tenderness.  Abdominal:  Soft. Bowel sounds are normal. He exhibits no distension. There is tenderness (LLQ, left flank).  Musculoskeletal: Normal range of motion. He exhibits no edema.  Neurological: He is alert and oriented to person, place, and time. He has normal reflexes. He displays normal reflexes. No cranial nerve deficit. He exhibits normal muscle tone. Coordination normal.  Skin: Skin is warm and dry. He is not diaphoretic.  Psychiatric: He has a normal mood and affect. His behavior is normal. Judgment and thought content normal.    ED Course  Procedures (including critical care time)  Results for orders placed during the hospital encounter of 04/21/12  CBC      Component Value  Range   WBC 5.8  4.0 - 10.5 K/uL   RBC 4.73  4.22 - 5.81 MIL/uL   Hemoglobin 14.8  13.0 - 17.0 g/dL   HCT 16.1  09.6 - 04.5 %   MCV 91.5  78.0 - 100.0 fL   MCH 31.3  26.0 - 34.0 pg   MCHC 34.2  30.0 - 36.0 g/dL   RDW 40.9  81.1 - 91.4 %   Platelets 170  150 - 400 K/uL  BASIC METABOLIC PANEL      Component Value Range   Sodium 135  135 - 145 mEq/L   Potassium 3.7  3.5 - 5.1 mEq/L   Chloride 100  96 - 112 mEq/L   CO2 25  19 - 32 mEq/L   Glucose, Bld 95  70 - 99 mg/dL   BUN 20  6 - 23 mg/dL   Creatinine, Ser 7.82  0.50 - 1.35 mg/dL   Calcium 9.2  8.4 - 95.6 mg/dL   GFR calc non Af Amer 61 (*) >90 mL/min   GFR calc Af Amer 70 (*) >90 mL/min  PRO B NATRIURETIC PEPTIDE      Component Value Range   Pro B Natriuretic peptide (BNP) 2641.0 (*) 0 - 125 pg/mL  TROPONIN I      Component Value Range   Troponin I <0.30  <0.30 ng/mL  URINALYSIS, ROUTINE W REFLEX MICROSCOPIC      Component Value Range   Color, Urine YELLOW  YELLOW   APPearance CLOUDY (*) CLEAR   Specific Gravity, Urine 1.011  1.005 - 1.030   pH 6.5  5.0 - 8.0   Glucose, UA NEGATIVE  NEGATIVE mg/dL   Hgb urine dipstick SMALL (*) NEGATIVE   Bilirubin Urine NEGATIVE  NEGATIVE   Ketones, ur NEGATIVE  NEGATIVE mg/dL   Protein, ur NEGATIVE  NEGATIVE mg/dL   Urobilinogen, UA 0.2  0.0 - 1.0 mg/dL   Nitrite POSITIVE (*) NEGATIVE   Leukocytes, UA LARGE (*) NEGATIVE  URINE RAPID DRUG SCREEN (HOSP PERFORMED)      Component Value Range   Opiates POSITIVE (*) NONE DETECTED   Cocaine NONE DETECTED  NONE DETECTED   Benzodiazepines POSITIVE (*) NONE DETECTED   Amphetamines NONE DETECTED  NONE DETECTED   Tetrahydrocannabinol POSITIVE (*) NONE DETECTED   Barbiturates NONE DETECTED  NONE DETECTED  URINE MICROSCOPIC-ADD ON      Component Value Range   Squamous Epithelial / LPF RARE  RARE   WBC, UA 11-20  <3 WBC/hpf   RBC / HPF 0-2  <3 RBC/hpf   Bacteria, UA MANY (*) RARE     Dg Chest Port 1 View  04/21/2012  *RADIOLOGY REPORT*   Clinical Data: Chest pain.  Altered mental status.  PORTABLE CHEST - 1 VIEW  Comparison: 04/18/2012 and 11/22/2009.  Findings:  Post CABG.  Pulmonary vascular prominence superimposed on chronic changes.  Calcified tortuous aorta.  No infiltrate or pneumothorax.  IMPRESSION: Pulmonary vascular prominence superimposed upon chronic lung changes.  Tortuous aorta.  Post CABG.  Slight indistinctness left heart border unchanged compared to 2011 exam.   Original Report Authenticated By: Fuller Canada, M.D.      Date: 04/21/2012  Rate: 72  Rhythm: normal sinus rhythm  QRS Axis: normal  Intervals: normal  ST/T Wave abnormalities: nonspecific ST changes  Conduction Disutrbances:none  Narrative Interpretation: Unchanged ekg  Old EKG Reviewed: unchanged    1. Chest pain   2. Amnesia   3. UTI (lower urinary tract infection)   4. Acute respiratory failure with hypoxia   5. Acute systolic heart failure   6. CKD (chronic kidney disease) stage 1, GFR 90 ml/min or greater   7. COPD (chronic obstructive pulmonary disease)       MDM  Labs showUA with infection.   Troponin  Neg. EKG unchanged.   Patient c/o increased cp and pressure given sl NTG x2 and 1 mg dilaudid with relief of sxs. Patient states that Morphine makes him hallucinate.  Patient was given morphine and ativan last night before his amnestic event.  He is very apologetic and says that it is totally out of character. Urine culture sent at admission failed to grow pathogen.  I have reordered culture. Patient previously treated with parenteral Rocephin which failed.  He was not treated for UTI in the ed and he denies urinary sxs at this time.  I spoke with Dr. Rito Ehrlich who has agreed to admit the patient.          Arthor Captain, PA-C 04/22/12 2059

## 2012-04-21 NOTE — ED Notes (Addendum)
Pt was admitted to [facility] 04/18/12 for his "lungs" (COPD exacerbation), per pt he woke up this am at home in red socks this morning with EKG electrodes on, pt started having chest pain this am.  Pt is disoriented to situation, doesn't know how he got home.

## 2012-04-21 NOTE — ED Notes (Signed)
Pt presenting to ed with c/o seen here in the hospital yesterday pt states he suppose to have heart cath today. pt states he's not sure how he got home he woke up this morning in red socks at home. Pt states he's currently having chest pain and has had pain x 4 days. Pt denies nausea and vomiting pt states positive shortness of breath. Pt states he thought he was suppose to be transferred to [facility] for heart cath and then they were suppose to bring him back to [place]. Pt states I don't know what's going on.

## 2012-04-21 NOTE — H&P (Signed)
[facility] History and Physical  Lucas [name] JXB:[id number] DOB: [birth year] DOA: 04/21/2012  Referring physician: Arthor Captain, ER PA PCP: Hayden Rasmussen, NP  Specialists: [facility]  Chief Complaint: Shortness of breath  HPI: Lucas [name] is a 66 year old white male past oral history COPD, prostate cancer status post resection and CAD status post CABG presented on 10/8 [place] pulmonary office and was complaining of severe chest pain or shortness of breath and was transferred to the hospital where he was found to be in acute congestive heart failure. Initially on the critical care service and workup noted an echo with ejection fraction of 25%. Patient was stabilized, diuresed and transferred to the hospitalist service on 10/10.  Initially the plan was for him to have a cardiac catheterization on 10/11, but on the evening of 10/10, the patient became agitated after receiving morphine.    He then insisted to leave. He was given Ativan, but does have a reverse effect and became even more angry and belligerent and went home.  The patient states that when he woke up, he had no memory being discharged, or how he had even physically got home. He is in no recollection and due to his post cardiac catheterization today. He became quite concerned so he came into the emergency room. Once he found out what happened, he states he had no memory of this and felt likely this was her medication. Never plan to go home given his plan for cardiac catheterization.   Review of Systems: The patient denies anorexia, fever, weight loss,, vision loss, decreased hearing, hoarseness, chest pain, syncope, dyspnea on exertion, peripheral edema, balance deficits, hemoptysis, abdominal pain, melena, hematochezia, severe indigestion/heartburn, hematuria, incontinence, genital sores, muscle weakness, suspicious skin lesions, transient blindness, difficulty walking, depression, unusual weight change, abnormal  bleeding, enlarged lymph nodes, angioedema, and breast masses.   His only complaint is some shortness of breath. No coughing or wheezing  Past Medical History  Diagnosis Date  . CAD (coronary artery disease)   . Hypertension   . Anxiety   . History of kidney cancer   . ADENOCARCINOMA, PROSTATE   . Adjustment disorder with anxiety   . Hyperlipidemia   . BPH (benign prostatic hypertrophy)   . COPD (chronic obstructive pulmonary disease)   . CAD (coronary artery disease)   . Myocardial infarction   . Shortness of breath    Past Surgical History  Procedure Date  . Coronary artery bypass graft     x 5  . Cardiac catheterization   . Nephrectomy   . Posterior cervical laminectomy     x 8  . Heart stents     x 5   Social History:  reports that he has been smoking Cigarettes.  He has a 16.2 pack-year smoking history. He has never used smokeless tobacco. He reports that he does not drink alcohol or use illicit drugs. Pt lives at home by himself.  He has a sister nearby.  Decubitus with some activities of daily living, but limited by shortness of breath.  Allergies  Allergen Reactions  . Ativan (Lorazepam) Other (See Comments)    Increases agitation  . Morphine And Related     hallucinations  . Ibuprofen Rash    Tolerates aspirin per patient.      Family History  Problem Relation Age of Onset  . Diabetes Mother   . Coronary artery disease    . Heart disease Mother   . Heart disease Father   . Heart disease  Brother   . Diabetes Father     Prior to Admission medications   Medication Sig Start Date End Date Taking? Authorizing Provider  albuterol (PROVENTIL HFA;VENTOLIN HFA) 108 (90 BASE) MCG/ACT inhaler Inhale 2 puffs into the lungs every 6 (six) hours as needed. Shortness of breath   Yes Historical Provider, MD  ALPRAZolam Prudy Feeler) 1 MG tablet Take 1 mg by mouth at bedtime as needed. anxiety   Yes Historical Provider, MD  doxazosin (CARDURA) 8 MG tablet Take 8 mg by mouth  at bedtime.     Yes Historical Provider, MD  HYDROcodone-acetaminophen (NORCO) 10-325 MG per tablet Take 1 tablet by mouth every 6 (six) hours as needed. For pain.   Yes Historical Provider, MD  Ipratropium-Albuterol (COMBIVENT RESPIMAT) 20-100 MCG/ACT AERS respimat Inhale 1 puff into the lungs every 8 (eight) hours.   Yes Historical Provider, MD  nebivolol (BYSTOLIC) 10 MG tablet Take 10 mg by mouth daily.   Yes Historical Provider, MD   Physical Exam: Filed Vitals:   04/21/12 1234 04/21/12 1421 04/21/12 1553 04/21/12 1643  BP: 145/77 142/63 183/101 166/92  Pulse: 72 75 70 61  Temp: 99.2 F (37.3 C) 98.9 F (37.2 C)    TempSrc: Oral Oral    Resp: 18 19 15 14   SpO2: 98% 97% 100% 100%     General:   Alert and oriented x3, no acute distress, fatigued, looks slightly older than stated age  Eyes:  sclera nonicteric, extraocular movements are intact  ENT:  mucous membranes are slightly dry  Neck:  Supple, mild JVD  Cardiovascular:  regular rate and rhythm, S1-S2  Respiratory:  decreased breath sounds at the bases  Abdomen:  soft, nontender, nondistended, positive bowel sounds  Skin:  no skin breaks tears or lesions  Musculoskeletal:  no clubbing or cyanosis, 1+ pitting edema  Psychiatric:  patient is appropriate currently, no evidence of acute psychoses  Neurologic:  no focal deficits  Labs on Admission:  Basic Metabolic Panel:  Lab 04/21/12 8119 04/20/12 0405 04/19/12 1647 04/19/12 0321 04/18/12 1605  NA 135 138 -- 138 139  K 3.7 3.6 3.8 3.5 3.6  CL 100 101 -- 101 103  CO2 25 28 -- 25 26  GLUCOSE 95 93 -- 104* 111*  BUN 20 21 -- 22 17  CREATININE 1.21 1.43* -- 1.38* 1.14  CALCIUM 9.2 8.9 -- 8.8 9.4  MG -- -- 2.0 2.0 2.2  PHOS -- -- -- -- 3.3   Liver Function Tests:  Lab 04/18/12 1605  AST 22  ALT 17  ALKPHOS 64  BILITOT 0.3  PROT 7.3  ALBUMIN 3.9    CBC:  Lab 04/21/12 1136 04/20/12 0405 04/19/12 0321 04/18/12 1605  WBC 5.8 6.4 5.9 7.5  NEUTROABS --  -- -- 5.1  HGB 14.8 13.9 13.4 14.5  HCT 43.3 42.6 39.5 42.4  MCV 91.5 92.6 92.5 91.4  PLT 170 188 191 189   Cardiac Enzymes:  Lab 04/21/12 1136 04/19/12 0321 04/18/12 2117 04/18/12 1605  CKTOTAL -- -- -- --  CKMB -- -- -- --  CKMBINDEX -- -- -- --  TROPONINI <0.30 <0.30 <0.30 <0.30    BNP (last 3 results)  Basename 04/21/12 1136 04/18/12 1605  PROBNP 2641.0* 4150.0*    Radiological Exams on Admission: Dg Chest Port 1 View  04/21/2012    IMPRESSION: Pulmonary vascular prominence superimposed upon chronic lung changes.  Tortuous aorta.  Post CABG.  Slight indistinctness left heart border unchanged compared to 2011 exam.  Original Report Authenticated By: Fuller Canada, M.D.     EKG: Independently reviewed. NSR with non spec t wave inversion in lat leads  Assessment/Plan Active Problems:  ADENOCARCINOMA, PROSTATE: He status post a prostate resection. Before his leaving yesterday, he was complaining of some painful and urination: Blood pressure slightly elevated. Work on control  HYPERTENSION  CORONARY ARTERY DISEASE  COPD (chronic obstructive pulmonary disease): Stable. Continue nebs.  Acute systolic heart failure BNP elevated, although improved from previous days. Continue diuresis. Plan is for cardiac cath next week  UTI (urinary tract infection): Resume IV Rocephin.  CKD (chronic kidney disease) stage 1, GFR 90 ml/min or greater   Code Status:  full code  Family Communication:  attempted leave message with sister  Disposition Plan:  patient will likely be here for several days, his planned cardiac catheterization would not be until Monday (indicate anticipated LOS)  Time spent:  40 minutes  Hollice Espy [facility] Pager [phone]  If 7PM-7AM, please contact night-coverage [email or web address] Password [facility] 04/21/2012, 4:48 PM

## 2012-04-21 NOTE — ED Notes (Signed)
ED PA notified that pt still c/o chest pressure and back pain

## 2012-04-21 NOTE — ED Notes (Signed)
Spoke with 4th floor [employer], pt apparently left AMA last night, pt's MD Virginia Rochester was notified, ED PA notified, ED PA to call Rito Ehrlich, pt was supposed to have cardiac cath done today

## 2012-04-22 DIAGNOSIS — I5021 Acute systolic (congestive) heart failure: Principal | ICD-10-CM

## 2012-04-22 DIAGNOSIS — R079 Chest pain, unspecified: Secondary | ICD-10-CM

## 2012-04-22 LAB — BASIC METABOLIC PANEL
BUN: 20 mg/dL (ref 6–23)
CO2: 26 mEq/L (ref 19–32)
Chloride: 100 mEq/L (ref 96–112)
Glucose, Bld: 82 mg/dL (ref 70–99)
Potassium: 3.9 mEq/L (ref 3.5–5.1)

## 2012-04-22 MED ORDER — IPRATROPIUM BROMIDE 0.02 % IN SOLN
0.5000 mg | Freq: Four times a day (QID) | RESPIRATORY_TRACT | Status: DC
  Administered 2012-04-22 – 2012-04-23 (×6): 0.5 mg via RESPIRATORY_TRACT
  Filled 2012-04-22 (×6): qty 2.5

## 2012-04-22 MED ORDER — OXYCODONE HCL 5 MG PO TABS
5.0000 mg | ORAL_TABLET | Freq: Once | ORAL | Status: AC
  Administered 2012-04-22: 5 mg via ORAL
  Filled 2012-04-22: qty 1

## 2012-04-22 MED ORDER — TRAMADOL HCL 50 MG PO TABS: 25.0000 mg | ORAL_TABLET | ORAL | Status: DC

## 2012-04-22 MED ORDER — CEFTRIAXONE SODIUM 1 G IJ SOLR
1.0000 g | INTRAMUSCULAR | Status: DC
  Administered 2012-04-22 – 2012-04-24 (×3): 1 g via INTRAVENOUS
  Filled 2012-04-22 (×5): qty 10

## 2012-04-22 MED ORDER — ALPRAZOLAM 0.25 MG PO TABS
0.2500 mg | ORAL_TABLET | Freq: Two times a day (BID) | ORAL | Status: DC | PRN
  Administered 2012-04-22 – 2012-04-24 (×3): 0.25 mg via ORAL
  Filled 2012-04-22 (×3): qty 1

## 2012-04-22 MED ORDER — LEVALBUTEROL HCL 0.63 MG/3ML IN NEBU
0.6300 mg | INHALATION_SOLUTION | Freq: Four times a day (QID) | RESPIRATORY_TRACT | Status: DC | PRN
  Filled 2012-04-22: qty 3

## 2012-04-22 MED ORDER — TRAMADOL HCL 50 MG PO TABS
25.0000 mg | ORAL_TABLET | ORAL | Status: AC
  Administered 2012-04-22: 25 mg via ORAL
  Filled 2012-04-22: qty 1

## 2012-04-22 MED ORDER — TRAMADOL HCL 50 MG PO TABS
25.0000 mg | ORAL_TABLET | ORAL | Status: DC | PRN
  Administered 2012-04-22: 25 mg via ORAL
  Filled 2012-04-22: qty 1

## 2012-04-22 MED ORDER — IPRATROPIUM BROMIDE 0.02 % IN SOLN: 0.5000 mg | Freq: Four times a day (QID) | RESPIRATORY_TRACT | Status: DC

## 2012-04-22 NOTE — Progress Notes (Addendum)
Wrong patient

## 2012-04-22 NOTE — Plan of Care (Signed)
Problem: Phase I Progression Outcomes Goal: Aspirin unless contraindicated Outcome: Not Progressing Patient refused

## 2012-04-22 NOTE — Progress Notes (Signed)
@   Subjective:  Complains of chest pain (increased with inspiration); denies dyspnea   Objective:  Filed Vitals:   04/21/12 1730 04/21/12 2101 04/22/12 0118 04/22/12 0514  BP: 167/88 139/73 146/90 138/86  Pulse: 71 57  57  Temp: 99.2 F (37.3 C) 98.1 F (36.7 C)  98.3 F (36.8 C)  TempSrc: Oral Oral  Oral  Resp: 16 16  18   Height: 5\' 10"  (1.778 m)     Weight: 200 lb 2.8 oz (90.8 kg)   197 lb 11.2 oz (89.676 kg)  SpO2: 98% 98%  99%    Intake/Output from previous day:  Intake/Output Summary (Last 24 hours) at 04/22/12 0727 Last data filed at 04/22/12 0516  Gross per 24 hour  Intake      0 ml  Output   1990 ml  Net  -1990 ml    Physical Exam: Physical exam: Well-developed well-nourished in no acute distress.  Skin is warm and dry.  HEENT is normal.  Neck is supple.  Chest with diminished BS throughout Cardiovascular exam is bradycardic but regular Abdominal exam nontender or distended. No masses palpated. Extremities show no edema. neuro grossly intact    Lab Results: Basic Metabolic Panel:  Basename 04/22/12 0453 04/21/12 1136 04/19/12 1647  NA 137 135 --  K 3.9 3.7 --  CL 100 100 --  CO2 26 25 --  GLUCOSE 82 95 --  BUN 20 20 --  CREATININE 1.25 1.21 --  CALCIUM 9.2 9.2 --  MG -- -- 2.0  PHOS -- -- --   CBC:  Basename 04/21/12 1136 04/20/12 0405  WBC 5.8 6.4  NEUTROABS -- --  HGB 14.8 13.9  HCT 43.3 42.6  MCV 91.5 92.6  PLT 170 188   Cardiac Enzymes:  Basename 04/21/12 1136  CKTOTAL --  CKMB --  CKMBINDEX --  TROPONINI <0.30     Assessment/Plan:  1 Acute systolic CHF-symptoms improved; Will diurese today and then hold lasix in AM; enzymes negative; CR improved; will plan R and L cath on Monday (patient left AMA 10/10 but states he doesn't know why); gentle hydration prior to procedure;. Echo shows severely reduced LV function; continue lopressor 12.5 mg po BID; will not advance as patient bradycardic overnight; add ACEI later after cath  once it is clear renal function stable. 2 COPD - primary care managing 3 UTI/hesitancy - continue antibiotics; urology follow up after DC 4 CAD - Continue ASA and statin. 5 CP - enzymes neg; plan cath Monday  Lucas [name] 04/22/2012, 7:27 AM

## 2012-04-22 NOTE — Progress Notes (Signed)
[facility] PROGRESS NOTE  Lucas [name] ZOX:[id number] DOB: [birth year] DOA: 04/21/2012 PCP: Hayden Rasmussen, NP  Assessment/Plan: Active Problems:  ADENOCARCINOMA, PROSTATE: : Status post resection.  CORONARY ARTERY DISEASE and  COPD (chronic obstructive pulmonary disease) patient complains of chest pain across his entire chest, worse with deep inspiration. Continue scheduled Xopenex and add when necessary Xopenex plus albuterol. No wheezing exams on no steroids at this time. Some of this may also be slight anxiety  Pulmonary edema, acute: Continue twice a day Lasix at 20 mg, has diuresed about 1.5 L already since admission yesterday. We'll not increase this dose, given plans for catheterization on Monday.  Angina pectoris  Acute respiratory failure with hypoxia  Acute systolic heart failure aggressive continue low-dose diuresis. See above.  Dysuria: Have high suspicion that the patient is having kidney stones. No evidence of obstruction. Treat symptomatically and control an outpatient urology followup CKD stage I: Baseline from 1.14 and today it was 1.25 UTI: On Rocephin 4/7.  Code Status: Full code Family Communication: Plan discussed with patient Disposition Plan: Cardiac catheterization on Monday, possibly home in the next one to 2 days after that   Consultants:  [facility] cardiology  [facility]-urology-signed off from previous hospitalization 10/10  [facility]-critical care-transferred patient to hospitalist service from previous hospitalization and then signed off on 10/10  Procedures: 10/8 ECHO:The cavity size was mildly dilated. Wall thickness was increased in a pattern of moderate LVH.Systolic function was severely reduced. The estimated ejection fraction was in the range of 25% to 30%. Diffuse hypokinesis. Features are consistent with a pseudonormal left ventricular filling pattern, with concomitant abnormalrelaxation and increased filling pressure (grade 2 diastolic  dysfunction).  Antibiotics:  IV Rocephin day for   HPI/Subjective: 66 year old white male past oral history COPD, prostate cancer status post resection and CAD status post CABG presented on 10/8 [place] pulmonary office and was complaining of severe chest pain or shortness of breath and was transferred to the hospital where he was found to be in acute congestive heart failure. Initially on the critical care service and workup noted an echo with ejection fraction of 25%. Patient was stabilized, diuresed and transferred to the hospitalist service on 10/10. He became confused on morphine and Ativan and left AMA with no memory of this after on the evening of 10/10. Patient was readmitted on 10/11 quite upset that he was amnestic as to what happened.  Patient complains of chest pressure dressing tight chest, although more noticeable on the left side. Notices it more when he takes a deep inspiration  Objective: Filed Vitals:   04/21/12 2101 04/22/12 0118 04/22/12 0514 04/22/12 0815  BP: 139/73 146/90 138/86   Pulse: 57  57   Temp: 98.1 F (36.7 C)  98.3 F (36.8 C)   TempSrc: Oral  Oral   Resp: 16  18   Height:      Weight:   89.676 kg (197 lb 11.2 oz)   SpO2: 98%  99% 97%    Intake/Output Summary (Last 24 hours) at 04/22/12 1040 Last data filed at 04/22/12 0924  Gross per 24 hour  Intake    480 ml  Output   2265 ml  Net  -1785 ml   Filed Weights   04/21/12 1730 04/22/12 0514  Weight: 90.8 kg (200 lb 2.8 oz) 89.676 kg (197 lb 11.2 oz)    Exam:   General:  Alert and oriented x3, mild distress secondary to pain after recent urinations thousand, fatigued, looks about stated age  Cardiovascular:  Regular rate and rhythm, S1-S2, soft 2/6 systolic ejection murmur  Respiratory: Clear to auscultation bilaterally, decreased breath sounds throughout secondary to COPD, no wheezing Abdomen: Soft, obese, nontender, positive bowel sounds  Extremities: No clubbing or cyanosis, trace pitting  edema  Data Reviewed: Basic Metabolic Panel:  Lab 04/22/12 4098 04/21/12 1136 04/20/12 0405 04/19/12 1647 04/19/12 0321 04/18/12 1605  NA 137 135 138 -- 138 139  K 3.9 3.7 3.6 3.8 3.5 --  CL 100 100 101 -- 101 103  CO2 26 25 28  -- 25 26  GLUCOSE 82 95 93 -- 104* 111*  BUN 20 20 21  -- 22 17  CREATININE 1.25 1.21 1.43* -- 1.38* 1.14  CALCIUM 9.2 9.2 8.9 -- 8.8 9.4  MG -- -- -- 2.0 2.0 2.2  PHOS -- -- -- -- -- 3.3   Liver Function Tests:  Lab 04/18/12 1605  AST 22  ALT 17  ALKPHOS 64  BILITOT 0.3  PROT 7.3  ALBUMIN 3.9   CBC:  Lab 04/21/12 1136 04/20/12 0405 04/19/12 0321 04/18/12 1605  WBC 5.8 6.4 5.9 7.5  NEUTROABS -- -- -- 5.1  HGB 14.8 13.9 13.4 14.5  HCT 43.3 42.6 39.5 42.4  MCV 91.5 92.6 92.5 91.4  PLT 170 188 191 189   Cardiac Enzymes:  Lab 04/21/12 1136 04/19/12 0321 04/18/12 2117 04/18/12 1605  CKTOTAL -- -- -- --  CKMB -- -- -- --  CKMBINDEX -- -- -- --  TROPONINI <0.30 <0.30 <0.30 <0.30   BNP (last 3 results)  Basename 04/21/12 1136 04/18/12 1605  PROBNP 2641.0* 4150.0*     Recent Results (from the past 240 hour(s))  MRSA PCR SCREENING     Status: Normal   Collection Time   04/18/12  4:11 PM      Component Value Range Status Comment   MRSA by PCR NEGATIVE  NEGATIVE Final   URINE CULTURE     Status: Normal   Collection Time   04/19/12  4:38 AM      Component Value Range Status Comment   Specimen Description URINE, CLEAN CATCH   Final    Special Requests NONE   Final    Culture  Setup Time 04/19/2012 08:37   Final    Colony Count NO GROWTH   Final    Culture NO GROWTH   Final    Report Status 04/20/2012 FINAL   Final      Studies: Portable Chest Xray  04/18/2012  radiation panic attack  IMPRESSION: Findings suggest acute pulmonary edema.   Original Report Authenticated By: Otilio Carpen, M.D.    10/11/13PORTABLE CHEST - 1 VIEW  IMPRESSION:  Pulmonary vascular prominence superimposed upon chronic lung  changes.  Tortuous aorta.    Post CABG.  Slight indistinctness left heart border unchanged compared to 2011  exam.  Scheduled Meds:    . aspirin  325 mg Oral Daily  . atorvastatin  40 mg Oral q1800  . cefTRIAXone (ROCEPHIN)  IV  1 g Intravenous Q24H  . doxazosin  8 mg Oral QHS  . enoxaparin  30 mg Subcutaneous Q24H  . furosemide  20 mg Intravenous Q12H  .  HYDROmorphone (DILAUDID) injection  1 mg Intravenous Once  . ipratropium  0.5 mg Nebulization QID  . levalbuterol  0.63 mg Nebulization Q6H  . metoprolol tartrate  12.5 mg Oral BID  . oxyCODONE  5 mg Oral Once  . sodium chloride  250 mL Intravenous Once  . sodium chloride  3 mL  Intravenous Q12H  . sodium chloride  3 mL Intravenous Q12H  . DISCONTD: nebivolol  10 mg Oral Daily   Continuous Infusions:   Active Problems:  ADENOCARCINOMA, PROSTATE  HYPERTENSION  CORONARY ARTERY DISEASE  COPD (chronic obstructive pulmonary disease)  Acute systolic heart failure  UTI (urinary tract infection)  CKD (chronic kidney disease) stage 1, GFR 90 ml/min or greater    Time spent:  30 minutes    Hollice Espy  [facility] Pager [phone]. If 8PM-8AM, please contact night-coverage at [email or web address], password [facility] 04/22/2012, 10:40 AM  LOS: 1 day

## 2012-04-22 NOTE — Progress Notes (Signed)
Checked on patient during morning report. Patient received nitro tablets x3 from night nurse. States he has uncontrolled chest pain.  Rounding cardiologist in room and aware.  Cardiologist states nitro may not be appropriate for his situation.  Paged attending Md regarding chest pain. Received one time order.

## 2012-04-22 NOTE — Progress Notes (Signed)
Patient states CP not any better after receiving pain medication.

## 2012-04-22 NOTE — Progress Notes (Signed)
Patient refused morning aspirin because of renal function.  Explained why he is receiving aspirin and possible risks of refusing dose.

## 2012-04-22 NOTE — Progress Notes (Signed)
5 runs of PVC's.  Paged Md for rhythm change.  Patient smiling, visiting with family.  States tramadol helped with chest pain although not pain free.

## 2012-04-22 NOTE — Progress Notes (Signed)
Patient states he is nervous and wants Zanax.

## 2012-04-22 NOTE — Progress Notes (Signed)
   CARE MANAGEMENT NOTE 04/22/2012  Patient:  Lucas [name], Lucas [name]   Account Number:  [id number]  Date Initiated:  04/22/2012  Documentation initiated by:  Johny Shock  Subjective/Objective Assessment:   Pt referral for home health CHF screening     Action/Plan:   Met with pt and discussed CHF screening program. He will decide between [facility] and [place] for program.   Anticipated DC Date:  04/24/2012   Anticipated DC Plan:  HOME W HOME HEALTH SERVICES         [facility] Choice  HOME HEALTH   Choice offered to / List presented to:  C-1 Patient        HH arranged  HH-10 DISEASE MANAGEMENT  HH-1 RN      Status of service:  In process, will continue to follow Medicare Important Message given?   (If response is "NO", the following Medicare IM given date fields will be blank) Date Medicare IM given:   Date Additional Medicare IM given:    Discharge Disposition:  HOME W HOME HEALTH SERVICES  Per UR Regulation:    If discussed at Long Length of Stay Meetings, dates discussed:    Comments:

## 2012-04-23 LAB — URINE CULTURE

## 2012-04-23 LAB — BASIC METABOLIC PANEL
BUN: 28 mg/dL — ABNORMAL HIGH (ref 6–23)
Chloride: 101 mEq/L (ref 96–112)
Creatinine, Ser: 1.35 mg/dL (ref 0.50–1.35)
GFR calc Af Amer: 62 mL/min — ABNORMAL LOW (ref >90)

## 2012-04-23 MED ORDER — IPRATROPIUM-ALBUTEROL 20-100 MCG/ACT IN AERS
1.0000 | INHALATION_SPRAY | Freq: Three times a day (TID) | RESPIRATORY_TRACT | Status: DC
  Administered 2012-04-24 – 2012-04-25 (×2): 1 via RESPIRATORY_TRACT
  Filled 2012-04-23: qty 4

## 2012-04-23 MED ORDER — SODIUM CHLORIDE 0.9 % IV SOLN
INTRAVENOUS | Status: DC
  Administered 2012-04-24: 50 mL/h via INTRAVENOUS

## 2012-04-23 NOTE — Progress Notes (Signed)
[facility] PROGRESS NOTE  Lucas [name] ZOX:[id number] DOB: [birth year] DOA: 04/21/2012 PCP: Hayden Rasmussen, NP  Assessment/Plan: Active Problems:  ADENOCARCINOMA, PROSTATE: : Status post resection.was having some painful and dysuria several days ago, but this looks to have resolved.  CORONARY ARTERY DISEASE: For cath tomorrow  COPD (chronic obstructive pulmonary disease): Improved. patientwas complaining yesterday of chest pain across his entire chest, worse with deep inspiration. Continue scheduled Xopenex and added when necessary Xopenex plus albuterol. No wheezing exams on no steroids at this time. Resolved and feeling much better today and  Pulmonary edema, acute: Lasix put on hold.given plans for catheterization on Monday.  Angina pectoris  Acute respiratory failure with hypoxia  Acute systolic heart failure Lasix put on hold. See above.  Dysuria: Have high suspicion that the patient is having kidney stones. No evidence of obstruction. Treat symptomatically and control an outpatient urology followup CKD stage I: Baseline from 1.14 and today it is up to 1.35 UTI: On Rocephin 5/7.  Code Status: Full code Family Communication: Plan discussed with patient Disposition Plan: Cardiac catheterization on Monday, possibly home in the next one to 2 days after that   Consultants:  [facility] cardiology  [facility]-urology-signed off from previous hospitalization 10/10  [facility]-critical care-transferred patient to hospitalist service from previous hospitalization and then signed off on 10/10  Procedures: 10/8 ECHO:The cavity size was mildly dilated. Wall thickness was increased in a pattern of moderate LVH.Systolic function was severely reduced. The estimated ejection fraction was in the range of 25% to 30%. Diffuse hypokinesis. Features are consistent with a pseudonormal left ventricular filling pattern, with concomitant abnormalrelaxation and increased filling pressure (grade 2  diastolic dysfunction).  Antibiotics:  IV Rocephin day 5   HPI/Subjective: 66 year old white male past oral history COPD, prostate cancer status post resection and CAD status post CABG presented on 10/8 [name] pulmonary office and was complaining of severe chest pain or shortness of breath and was transferred to the hospital where he was found to be in acute congestive heart failure. Initially on the critical care service and workup noted an echo with ejection fraction of 25%. Patient was stabilized, diuresed and transferred to the hospitalist service on 10/10. He became confused on morphine and Ativan and left AMA with no memory of this after on the evening of 10/10. Patient was readmitted on 10/11 quite upset that he was amnestic as to what happened.  Patient feeling good today. No chest pain or shortness of breath. No chest tightness. He states that he felt a long time. No complaints  Objective: Filed Vitals:   04/23/12 0107 04/23/12 0500 04/23/12 0543 04/23/12 0830  BP:   138/64   Pulse:   53   Temp:   98.3 F (36.8 C)   TempSrc:   Oral   Resp:   18   Height:      Weight:  88.497 kg (195 lb 1.6 oz) 88.497 kg (195 lb 1.6 oz)   SpO2: 98%  99% 95%    Intake/Output Summary (Last 24 hours) at 04/23/12 1008 Last data filed at 04/23/12 0813  Gross per 24 hour  Intake    776 ml  Output   2375 ml  Net  -1599 ml   Filed Weights   04/22/12 0514 04/23/12 0500 04/23/12 0543  Weight: 89.676 kg (197 lb 11.2 oz) 88.497 kg (195 lb 1.6 oz) 88.497 kg (195 lb 1.6 oz)    Exam:   General:  Alert and oriented x3, mild distress secondary to pain after  recent urinations thousand, fatigued, looks about stated age  Cardiovascular: Regular rate and rhythm, S1-S2, soft 2/6 systolic ejection murmur  Respiratory: Clear to auscultation bilaterally, decreased breath sounds throughout secondary to COPD, no wheezing Abdomen: Soft, obese, nontender, positive bowel sounds  Extremities: No clubbing or  cyanosis, trace pitting edema  Data Reviewed: Basic Metabolic Panel:  Lab 04/23/12 1610 04/22/12 0453 04/21/12 1136 04/20/12 0405 04/19/12 1647 04/19/12 0321 04/18/12 1605  NA 136 137 135 138 -- 138 --  K 4.0 3.9 3.7 3.6 3.8 -- --  CL 101 100 100 101 -- 101 --  CO2 26 26 25 28  -- 25 --  GLUCOSE 102* 82 95 93 -- 104* --  BUN 28* 20 20 21  -- 22 --  CREATININE 1.35 1.25 1.21 1.43* -- 1.38* --  CALCIUM 9.4 9.2 9.2 8.9 -- 8.8 --  MG -- -- -- -- 2.0 2.0 2.2  PHOS -- -- -- -- -- -- 3.3   Liver Function Tests:  Lab 04/18/12 1605  AST 22  ALT 17  ALKPHOS 64  BILITOT 0.3  PROT 7.3  ALBUMIN 3.9   CBC:  Lab 04/21/12 1136 04/20/12 0405 04/19/12 0321 04/18/12 1605  WBC 5.8 6.4 5.9 7.5  NEUTROABS -- -- -- 5.1  HGB 14.8 13.9 13.4 14.5  HCT 43.3 42.6 39.5 42.4  MCV 91.5 92.6 92.5 91.4  PLT 170 188 191 189   Cardiac Enzymes:  Lab 04/21/12 1136 04/19/12 0321 04/18/12 2117 04/18/12 1605  CKTOTAL -- -- -- --  CKMB -- -- -- --  CKMBINDEX -- -- -- --  TROPONINI <0.30 <0.30 <0.30 <0.30   BNP (last 3 results)  Basename 04/23/12 0437 04/21/12 1136 04/18/12 1605  PROBNP 1636.0* 2641.0* 4150.0*      Studies: Portable Chest Xray  04/18/2012  radiation panic attack  IMPRESSION: Findings suggest acute pulmonary edema.   Original Report Authenticated By: Otilio Carpen, M.D.    10/11/13PORTABLE CHEST - 1 VIEW  IMPRESSION:  Pulmonary vascular prominence superimposed upon chronic lung  changes.  Tortuous aorta.  Post CABG.  Slight indistinctness left heart border unchanged compared to 2011  exam.  Scheduled Meds:    . aspirin  325 mg Oral Daily  . atorvastatin  40 mg Oral q1800  . cefTRIAXone (ROCEPHIN)  IV  1 g Intravenous Q24H  . doxazosin  8 mg Oral QHS  . enoxaparin  30 mg Subcutaneous Q24H  . ipratropium  0.5 mg Nebulization Q6H  . levalbuterol  0.63 mg Nebulization Q6H  . metoprolol tartrate  12.5 mg Oral BID  . sodium chloride  3 mL Intravenous Q12H  . sodium  chloride  3 mL Intravenous Q12H  . traMADol  25 mg Oral NOW  . DISCONTD: furosemide  20 mg Intravenous Q12H  . DISCONTD: ipratropium  0.5 mg Nebulization QID  . DISCONTD: traMADol  25 mg Oral NOW   Continuous Infusions:    . sodium chloride      Active Problems:  ADENOCARCINOMA, PROSTATE  HYPERTENSION  CORONARY ARTERY DISEASE  COPD (chronic obstructive pulmonary disease)  Acute systolic heart failure  UTI (urinary tract infection)  CKD (chronic kidney disease) stage 1, GFR 90 ml/min or greater    Time spent:  15 minutes    Hollice Espy  [facility] Pager [phone]. If 8PM-8AM, please contact night-coverage at [email or web address], password [facility] 04/23/2012, 10:08 AM  LOS: 2 days

## 2012-04-23 NOTE — Progress Notes (Signed)
@   Subjective:  Denies dyspnea or chest pain   Objective:  Filed Vitals:   04/22/12 2204 04/23/12 0107 04/23/12 0500 04/23/12 0543  BP: 122/77   138/64  Pulse: 55   53  Temp: 98.2 F (36.8 C)   98.3 F (36.8 C)  TempSrc: Oral   Oral  Resp: 18   18  Height:      Weight:   195 lb 1.6 oz (88.497 kg) 195 lb 1.6 oz (88.497 kg)  SpO2: 98% 98%  99%    Intake/Output from previous day:  Intake/Output Summary (Last 24 hours) at 04/23/12 9604 Last data filed at 04/23/12 0544  Gross per 24 hour  Intake    776 ml  Output   2075 ml  Net  -1299 ml    Physical Exam: Physical exam: Well-developed well-nourished in no acute distress.  Skin is warm and dry.  HEENT is normal.  Neck is supple.  Chest with diminished BS throughout Cardiovascular exam is bradycardic but regular Abdominal exam nontender or distended. No masses palpated. Extremities show no edema. neuro grossly intact    Lab Results: Basic Metabolic Panel:  Basename 04/23/12 0437 04/22/12 0453  NA 136 137  K 4.0 3.9  CL 101 100  CO2 26 26  GLUCOSE 102* 82  BUN 28* 20  CREATININE 1.35 1.25  CALCIUM 9.4 9.2  MG -- --  PHOS -- --   CBC:  Basename 04/21/12 1136  WBC 5.8  NEUTROABS --  HGB 14.8  HCT 43.3  MCV 91.5  PLT 170   Cardiac Enzymes:  Basename 04/21/12 1136  CKTOTAL --  CKMB --  CKMBINDEX --  TROPONINI <0.30   Telemetry - NSVT  Assessment/Plan:  1 Acute systolic CHF-symptoms improved; Hold lasix in anticipation of cath; enzymes negative; will plan R and L cath on Monday if renal function stable in AM; gentle hydration prior to procedure. Echo shows severely reduced LV function; continue lopressor 12.5 mg po BID; will not advance as patient bradycardic overnight; add ACEI later after cath once it is clear renal function stable. 2 COPD - primary care managing 3 UTI/hesitancy - continue antibiotics; urology follow up after DC 4 CAD - Continue ASA and statin. 5 CP - enzymes neg; plan cath  Monday  Olga Millers 04/23/2012, 7:12 AM

## 2012-04-23 NOTE — Progress Notes (Signed)
Patient states he feels great today.  Refusing aspirin.   Wants to know what time his surgery will be and if he will be returning to [facility]

## 2012-04-23 NOTE — Progress Notes (Signed)
Nutrition Brief Note  Patient identified on the Malnutrition Screening Tool (MST) Report  Body mass index is 27.99 kg/(m^2). Pt meets criteria for overweight based on current BMI.   Current diet order is heart healthy, patient is consuming approximately 100% of meals at this time. Labs and medications reviewed. Recent weight loss due to fluid loss/CHF. Patient is 100% of usual body weight.   Patient educated on consuming a heart healthy diet at discharge at his request. If nutrition issues arise, please consult RD.   Linnell Fulling, RD, LDN Pager #: [phone] After-Hours Pager #: [phone]

## 2012-04-23 NOTE — ED Provider Notes (Signed)
Medical screening examination/treatment/procedure(s) were performed by non-physician practitioner and as supervising physician I was immediately available for consultation/collaboration.  Reba Hulett R. Rozanna Cormany, MD 04/23/12 1633 

## 2012-04-24 ENCOUNTER — Encounter (HOSPITAL_COMMUNITY)
Admission: EM | Disposition: A | Discharge status: Home / Self Care | Payer: Self-pay | Source: Home / Self Care | Attending: Internal Medicine

## 2012-04-24 DIAGNOSIS — I509 Heart failure, unspecified: Secondary | ICD-10-CM

## 2012-04-24 DIAGNOSIS — I2510 Atherosclerotic heart disease of native coronary artery without angina pectoris: Secondary | ICD-10-CM

## 2012-04-24 HISTORY — PX: LEFT AND RIGHT HEART CATHETERIZATION WITH CORONARY/GRAFT ANGIOGRAM: SHX5448

## 2012-04-24 LAB — BASIC METABOLIC PANEL
BUN: 29 mg/dL — ABNORMAL HIGH (ref 6–23)
CO2: 27 mEq/L (ref 19–32)
Chloride: 100 mEq/L (ref 96–112)
Creatinine, Ser: 1.34 mg/dL (ref 0.50–1.35)
GFR calc Af Amer: 62 mL/min — ABNORMAL LOW (ref >90)

## 2012-04-24 LAB — CBC
HCT: 42.7 % (ref 39.0–52.0)
MCHC: 34 g/dL (ref 30.0–36.0)
MCV: 91.2 fL (ref 78.0–100.0)
RDW: 13.2 % (ref 11.5–15.5)

## 2012-04-24 SURGERY — LEFT AND RIGHT HEART CATHETERIZATION WITH CORONARY/GRAFT ANGIOGRAM
Anesthesia: LOCAL

## 2012-04-24 MED ORDER — HEPARIN (PORCINE) IN NACL 2-0.9 UNIT/ML-% IJ SOLN
INTRAMUSCULAR | Status: AC
  Filled 2012-04-24: qty 1000

## 2012-04-24 MED ORDER — SODIUM CHLORIDE 0.9 % IJ SOLN: 3.0000 mL | Freq: Two times a day (BID) | INTRAMUSCULAR | Status: DC

## 2012-04-24 MED ORDER — SODIUM CHLORIDE 0.9 % IV SOLN
1.0000 mL/kg/h | INTRAVENOUS | Status: DC
  Administered 2012-04-24: 1 mL/kg/h via INTRAVENOUS

## 2012-04-24 MED ORDER — ONDANSETRON HCL 4 MG/2ML IJ SOLN: 4.0000 mg | Freq: Four times a day (QID) | INTRAMUSCULAR | Status: DC | PRN

## 2012-04-24 MED ORDER — SODIUM CHLORIDE 0.9 % IV SOLN: 250.0000 mL | INTRAVENOUS | Status: DC

## 2012-04-24 MED ORDER — HYDROMORPHONE HCL PF 2 MG/ML IJ SOLN
INTRAMUSCULAR | Status: AC
  Filled 2012-04-24: qty 1

## 2012-04-24 MED ORDER — FENTANYL CITRATE 0.05 MG/ML IJ SOLN
INTRAMUSCULAR | Status: AC
  Filled 2012-04-24: qty 2

## 2012-04-24 MED ORDER — NITROGLYCERIN 0.2 MG/ML ON CALL CATH LAB
INTRAVENOUS | Status: AC
  Filled 2012-04-24: qty 1

## 2012-04-24 MED ORDER — LIDOCAINE HCL (PF) 1 % IJ SOLN
INTRAMUSCULAR | Status: AC
  Filled 2012-04-24: qty 30

## 2012-04-24 MED ORDER — ACETAMINOPHEN 325 MG PO TABS: 650.0000 mg | ORAL_TABLET | ORAL | Status: DC | PRN

## 2012-04-24 MED ORDER — MIDAZOLAM HCL 2 MG/2ML IJ SOLN
INTRAMUSCULAR | Status: AC
  Filled 2012-04-24: qty 2

## 2012-04-24 MED ORDER — SODIUM CHLORIDE 0.9 % IJ SOLN: 3.0000 mL | INTRAMUSCULAR | Status: DC | PRN

## 2012-04-24 NOTE — Progress Notes (Signed)
Patient received to cath lab holding area from [facility]. Patient extremely anxious. Painfree. BP 171/81 HR SR 76 with occasional PVC's. RR16 with a sat of 94% on room air. NSL right forearm. 0.9% NS infusing @ 50cc/hr left forearm

## 2012-04-24 NOTE — CV Procedure (Addendum)
   Cardiac Catheterization Procedure Note  Name: Lucas [name] MRN: [id number] DOB: 15-Oct-1945  Procedure: Right Heart Cath, Left Heart Cath, Selective Coronary Angiography, LV angiography, SVG angiography, LIMA angiography  Indication: CHF, severe LV dysfunction  Procedural Details: The right groin was prepped, draped, and anesthetized with 1% lidocaine. Using the modified Seldinger technique a 5 French sheath was placed in the right femoral artery and a 7 French sheath was placed in the right femoral vein. A Swan-Ganz catheter was used for the right heart catheterization. Standard protocol was followed for recording of right heart pressures and sampling of oxygen saturations. Fick cardiac output was calculated. Standard Judkins catheters were used for selective coronary angiography and left ventriculography. There were no immediate procedural complications. The patient was transferred to the post catheterization recovery area for further monitoring.  Procedural Findings: Hemodynamics RA 6 RV 27/4 PA 26/13 mean 19 PCWP 8 LV 99/16 AO 108/62 mean 81  Oxygen saturations: PA 63 AO 97  Cardiac Output (Fick) 4.1  Cardiac Index (Fick) 2.0   Coronary angiography: Coronary dominance: right  Left mainstem: Patent with moderate diffuse disease.  Left anterior descending (LAD): Total proximal occlusion, heavy calcification.  Left circumflex (LCx): Severe diffuse disease with severe stenoses of the first and second obtuse marginal branches at the ostia. Subtotal occlusion of the mid left circumflex.  Right coronary artery (RCA): Total occlusion of the proximal vessel   SVG - OM3 widely patent, supplies collateral to OM1/2 and RCA branches  SVG - diag total occlusion  SVG - RCA total occlusion  SVG OM4 total occlusion  LIMA - LAD widely patent, supplies collateral to PDA  Left ventriculography: Left ventricular systolic function is mildly reduced. Assessment is somewhat  complicated by PVC beats, but the inferior wall appears hypokinetic. The other segments appear normal. I would estimate the left ventricular ejection fraction at 45%.  Final Conclusions:   1. Severe three-vessel coronary artery disease 2. 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 3. Moderate LV dysfunction as above 4. Normal intracardiac hemodynamics  Recommendations: Continued medical therapy. His coronary anatomy is stable from his prior cardiac catheterization.  Tonny Bollman 04/24/2012, 1:16 PM

## 2012-04-24 NOTE — H&P (View-Only) (Signed)
Chart reviewed. Pt with new LV dysfunction, known CAD with prior CABG. Cath delayed last week because of renal insufficiency. Cath today at noon with Dr. Cooper. I have reviewed the procedure with the patient and he agrees to proceed. Labs ok for cath this am. Would be cautious with sedation. See notes from chart about pt confusion with Ativan last week. He left the hospital AMA after Ativan and morphine and woke up at home confusion, not remembering leaving the hospital or driving home.   Lucas [name] 7:46 AM 04/24/2012  

## 2012-04-24 NOTE — Interval H&P Note (Signed)
History and Physical Interval Note:  04/24/2012 12:21 PM  Lucas [name]  has presented today for surgery, with the diagnosis of cad  The various methods of treatment have been discussed with the patient and family. After consideration of risks, benefits and other options for treatment, the patient has consented to  Procedure(s) (LRB) with comments: LEFT AND RIGHT HEART CATHETERIZATION WITH CORONARY/GRAFT ANGIOGRAM (N/A) as a surgical intervention .  The patient's history has been reviewed, patient examined, no change in status, stable for surgery.  I have reviewed the patient's chart and labs.  Questions were answered to the patient's satisfaction.     Tonny Bollman

## 2012-04-24 NOTE — Progress Notes (Signed)
Chart reviewed. Pt with new LV dysfunction, known CAD with prior CABG. Cath delayed last week because of renal insufficiency. Cath today at noon with Dr. Excell Seltzer. I have reviewed the procedure with the patient and he agrees to proceed. Labs ok for cath this am. Would be cautious with sedation. See notes from chart about pt confusion with Ativan last week. He left the hospital AMA after Ativan and morphine and woke up at home confusion, not remembering leaving the hospital or driving home.   MCALHANY,CHRISTOPHER 7:46 AM 04/24/2012

## 2012-04-24 NOTE — Progress Notes (Signed)
[facility] PROGRESS NOTE  Lucas [name] HYQ:[id number] DOB: 07-29-1945 DOA: 04/21/2012 PCP: Hayden Rasmussen, NP  Assessment/Plan: Active Problems:  ADENOCARCINOMA, PROSTATE: : Status post resection.was having some painful and dysuria several days ago, but this looks to have resolved.  CORONARY ARTERY DISEASE: Patient is status post cardiac catheterization. Grafted vessels looked to be patent. He still has triple vessel disease which is managed medically. We'll follow up with cardiology and if he is considered stable to be discharged home tomorrow. We'll recheck lab work in the morning Patient currently is comfortable with no complaints.  COPD (chronic obstructive pulmonary disease): Improved. patientwas complaining yesterday of chest pain across his entire chest, worse with deep inspiration. Continue scheduled Xopenex and added when necessary Xopenex plus albuterol. No wheezing exams on no steroids at this time. Resolved and feeling much better today and  Pulmonary edema, acute: Check blood work restart Lasix in the morning Angina pectoris  Acute respiratory failure with hypoxia  Acute systolic heart failure Lasix put on hold. See above.  Dysuria: Have high suspicion that the patient is having kidney stones. No evidence of obstruction. Treat symptomatically and control an outpatient urology followup CKD stage I: Baseline from 1.14 and today it is up to 1.35, recheck creatinine tomorrow UTI: On Rocephin 6/7.  Code Status: Full code Family Communication: Plan discussed with patient Disposition Plan: Hopefully home tomorrow  Consultants:  [facility] cardiology  [facility]-urology-signed off from previous hospitalization 10/10  [facility]-critical care-transferred patient to hospitalist service from previous hospitalization and then signed off on 10/10  Procedures: 10/8 ECHO:The cavity size was mildly dilated. Wall thickness was increased in a pattern of moderate LVH.Systolic function  was severely reduced. The estimated ejection fraction was in the range of 25% to 30%. Diffuse hypokinesis. Features are consistent with a pseudonormal left ventricular filling pattern, with concomitant abnormalrelaxation and increased filling pressure (grade 2 diastolic dysfunction).  Antibiotics:  IV Rocephin day 6  HPI/Subjective: 66 year old white male past oral history COPD, prostate cancer status post resection and CAD status post CABG presented on 10/8 [place] pulmonary office and was complaining of severe chest pain or shortness of breath and was transferred to the hospital where he was found to be in acute congestive heart failure. Initially on the critical care service and workup noted an echo with ejection fraction of 25%. Patient was stabilized, diuresed and transferred to the hospitalist service on 10/10. He became confused on morphine and Ativan and left AMA with no memory of this after on the evening of 10/10. Patient was readmitted on 10/11 quite upset that he was amnestic as to what happened.  Patient feeling good today. Patient seen before and after cardiac catheterization. No complaints. No chest pain or shortness of breath.  Objective: Filed Vitals:   04/23/12 2110 04/24/12 0500 04/24/12 1213 04/24/12 1540  BP: 148/76 153/73  146/76  Pulse: 62 51 71 71  Temp: 97.6 F (36.4 C) 98.3 F (36.8 C)  98.2 F (36.8 C)  TempSrc: Oral Oral  Oral  Resp: 19 18  20   Height:      Weight:  89.1 kg (196 lb 6.9 oz)    SpO2: 95% 96%  93%    Intake/Output Summary (Last 24 hours) at 04/24/12 1705 Last data filed at 04/24/12 1617  Gross per 24 hour  Intake  147.5 ml  Output    950 ml  Net -802.5 ml   Filed Weights   04/23/12 0500 04/23/12 0543 04/24/12 0500  Weight: 88.497 kg (195 lb 1.6  oz) 88.497 kg (195 lb 1.6 oz) 89.1 kg (196 lb 6.9 oz)    Exam:   General:  Alert and oriented x3, mild distress secondary to pain after recent urinations thousand, fatigued, looks about  stated age  Cardiovascular: Regular rate and rhythm, S1-S2, soft 2/6 systolic ejection murmur  Respiratory: Clear to auscultation bilaterally, decreased breath sounds throughout secondary to COPD, no wheezing Abdomen: Soft, obese, nontender, positive bowel sounds  Extremities: No clubbing or cyanosis, trace pitting edema  Data Reviewed: Basic Metabolic Panel:  Lab 04/24/12 4782 04/23/12 0437 04/22/12 0453 04/21/12 1136 04/20/12 0405 04/19/12 1647 04/19/12 0321 04/18/12 1605  NA 136 136 137 135 138 -- -- --  K 4.2 4.0 3.9 3.7 3.6 -- -- --  CL 100 101 100 100 101 -- -- --  CO2 27 26 26 25 28  -- -- --  GLUCOSE 99 102* 82 95 93 -- -- --  BUN 29* 28* 20 20 21  -- -- --  CREATININE 1.34 1.35 1.25 1.21 1.43* -- -- --  CALCIUM 9.3 9.4 9.2 9.2 8.9 -- -- --  MG -- -- -- -- -- 2.0 2.0 2.2  PHOS -- -- -- -- -- -- -- 3.3   Liver Function Tests:  Lab 04/18/12 1605  AST 22  ALT 17  ALKPHOS 64  BILITOT 0.3  PROT 7.3  ALBUMIN 3.9   CBC:  Lab 04/24/12 0410 04/21/12 1136 04/20/12 0405 04/19/12 0321 04/18/12 1605  WBC 7.0 5.8 6.4 5.9 7.5  NEUTROABS -- -- -- -- 5.1  HGB 14.5 14.8 13.9 13.4 14.5  HCT 42.7 43.3 42.6 39.5 42.4  MCV 91.2 91.5 92.6 92.5 91.4  PLT 191 170 188 191 189   Cardiac Enzymes:  Lab 04/21/12 1136 04/19/12 0321 04/18/12 2117 04/18/12 1605  CKTOTAL -- -- -- --  CKMB -- -- -- --  CKMBINDEX -- -- -- --  TROPONINI <0.30 <0.30 <0.30 <0.30   BNP (last 3 results)  Basename 04/23/12 0437 04/21/12 1136 04/18/12 1605  PROBNP 1636.0* 2641.0* 4150.0*      Studies: Portable Chest Xray  04/18/2012  radiation panic attack  IMPRESSION: Findings suggest acute pulmonary edema.   Original Report Authenticated By: Otilio Carpen, M.D.    10/11/13PORTABLE CHEST - 1 VIEW  IMPRESSION:  Pulmonary vascular prominence superimposed upon chronic lung  changes.  Tortuous aorta.  Post CABG.  Slight indistinctness left heart border unchanged compared to 2011  exam.  Scheduled  Meds:    . aspirin  325 mg Oral Daily  . atorvastatin  40 mg Oral q1800  . cefTRIAXone (ROCEPHIN)  IV  1 g Intravenous Q24H  . doxazosin  8 mg Oral QHS  . enoxaparin  30 mg Subcutaneous Q24H  . fentaNYL      . heparin      . HYDROmorphone      . HYDROmorphone      . Ipratropium-Albuterol  1 puff Inhalation TID  . lidocaine      . metoprolol tartrate  12.5 mg Oral BID  . midazolam      . nitroGLYCERIN      . sodium chloride  3 mL Intravenous Q12H  . sodium chloride  3 mL Intravenous Q12H  . DISCONTD: ipratropium  0.5 mg Nebulization Q6H  . DISCONTD: levalbuterol  0.63 mg Nebulization Q6H  . DISCONTD: sodium chloride  3 mL Intravenous Q12H   Continuous Infusions:    . sodium chloride 50 mL/hr at 04/24/12 0651  . sodium chloride 1 mL/kg/hr (  04/24/12 1547)  . sodium chloride      Active Problems:  ADENOCARCINOMA, PROSTATE  HYPERTENSION  CORONARY ARTERY DISEASE  COPD (chronic obstructive pulmonary disease)  Acute systolic heart failure  UTI (urinary tract infection)  CKD (chronic kidney disease) stage 1, GFR 90 ml/min or greater    Time spent:  15 minutes    Hollice Espy  [facility] Pager [phone]. If 8PM-8AM, please contact night-coverage at [email or web address], password [facility] 04/24/2012, 5:05 PM  LOS: 3 days

## 2012-04-25 LAB — BASIC METABOLIC PANEL
Calcium: 9.4 mg/dL (ref 8.4–10.5)
GFR calc Af Amer: 62 mL/min — ABNORMAL LOW (ref >90)
GFR calc non Af Amer: 54 mL/min — ABNORMAL LOW (ref >90)
Glucose, Bld: 97 mg/dL (ref 70–99)
Sodium: 137 mEq/L (ref 135–145)

## 2012-04-25 LAB — POCT I-STAT 3, ART BLOOD GAS (G3+)
Bicarbonate: 24.1 mEq/L — ABNORMAL HIGH (ref 20.0–24.0)
pCO2 arterial: 37.1 mmHg (ref 35.0–45.0)
pH, Arterial: 7.422 (ref 7.350–7.450)

## 2012-04-25 LAB — POCT I-STAT 3, VENOUS BLOOD GAS (G3P V)
O2 Saturation: 63 %
pCO2, Ven: 41.4 mmHg — ABNORMAL LOW (ref 45.0–50.0)

## 2012-04-25 MED ORDER — ASPIRIN 325 MG PO TABS: 325.0000 mg | ORAL_TABLET | Freq: Every day | ORAL | Status: DC

## 2012-04-25 MED ORDER — FUROSEMIDE 20 MG PO TABS: 20.0000 mg | ORAL_TABLET | Freq: Every day | ORAL | Status: DC

## 2012-04-25 MED ORDER — CEFUROXIME AXETIL 500 MG PO TABS: 500.0000 mg | ORAL_TABLET | Freq: Two times a day (BID) | ORAL | Status: DC

## 2012-04-25 MED ORDER — ATORVASTATIN CALCIUM 40 MG PO TABS: 40.0000 mg | ORAL_TABLET | Freq: Every day | ORAL | Status: DC

## 2012-04-25 MED ORDER — LISINOPRIL 10 MG PO TABS: 10.0000 mg | ORAL_TABLET | Freq: Every day | ORAL | Status: DC

## 2012-04-25 NOTE — Discharge Summary (Signed)
D   Int      Physician Discharge Summary  Lucas [name]:[id number] DOB: 05-Apr-1946 DOA: 04/18/2012  PCP: Hayden Rasmussen, NP  Admit date: 04/18/2012 Discharge date: 04/25/2012  Recommendations for Outpatient Follow-up:  1. Patient left AGAINST MEDICAL ADVICE  Discharge Diagnoses:  Active Problems:  ADENOCARCINOMA, PROSTATE  CORONARY ARTERY DISEASE  COPD (chronic obstructive pulmonary disease)  Pulmonary edema, acute  Angina pectoris  Acute respiratory failure with hypoxia  Acute systolic heart failure  Dysuria  UTI (urinary tract infection)  CKD (chronic kidney disease) stage 1, GFR 90 ml/min or greater   Discharge Condition: Is stabilized, the patient left AMA  Diet recommendation: Heart healthy the patient left [facility]  Filed Weights   04/18/12 1530 04/19/12 1520 04/20/12 0525  Weight: 88.1 kg (194 lb 3.6 oz) 91.717 kg (202 lb 3.2 oz) 94.1 kg (207 lb 7.3 oz)    History of present illness:  66 year old white male past oral history COPD, prostate cancer status post resection and CAD status post CABG presented on 10/8 [place] pulmonary office and was complaining of severe chest pain or shortness of breath and was transferred to the hospital where he was found to be in acute congestive heart failure. Initially on the critical care service and workup noted an echo with ejection fraction of 25%. Patient was stabilized, diuresed and transferred to the hospitalist service on 10/10.   Hospital Course:  ADENOCARCINOMA, PROSTATE: : Status post resection. Urology signed off.  CORONARY ARTERY DISEASE and  COPD (chronic obstructive pulmonary disease) currently stable  Pulmonary edema, acute: See below  Angina pectoris  Acute respiratory failure with hypoxia  Acute systolic heart failure aggressive diuresis on hold given plans to monitor kidney function and plan for heart cath on Friday, but patient left AGAINST MEDICAL ADVICE Dysuria: Have high suspicion that the patient is having  kidney stones. No evidence of obstruction. Treat symptomatically and control an outpatient urology followup  CKD stage I: Increasing creatinine the last couple of days. Baseline from 1.14 and today it was 1.43.  UTI: On Rocephin 3/7. Awaiting cultures and sensitivities. Left AGAINST MEDICAL ADVICE before he could get rest of his antibiotics  Procedures: Echocardiogram done 10/08:Left ventricle: The cavity size was mildly dilated. Wall thickness was increased in a pattern of moderate LVH. Systolic function was severely reduced. The estimated ejection fraction was in the range of 25% to 30%. Diffuse hypokinesis. Features are consistent with a pseudonormal left ventricular filling pattern, with concomitant abnormal relaxation and increased filling pressure (grade 2 diastolic dysfunction). - Aortic valve: Trivial regurgitation. - Left atrium: The atrium was mildly dilated.     Consultations:  Tannenbaum-urology  Crenshaw-cardiology  wright-critical care  Discharge Exam: Filed Vitals:   04/20/12 0525 04/20/12 0855 04/20/12 1456 04/20/12 1645  BP: 145/75  145/71   Pulse: 47  55   Temp: 97.7 F (36.5 C)  97.7 F (36.5 C)   TempSrc: Oral  Oral   Resp: 16  15   Height:      Weight: 94.1 kg (207 lb 7.3 oz)     SpO2: 94% 94% 95% 93%   See previous note for exam. Patient left AMA  Discharge Instructions     Medication List     As of 04/25/2012 12:49 PM    ASK your doctor about these medications         albuterol 108 (90 BASE) MCG/ACT inhaler   Commonly known as: PROVENTIL HFA;VENTOLIN HFA   Inhale 2 puffs into the lungs  every 6 (six) hours as needed. Shortness of breath      ALPRAZolam 1 MG tablet   Commonly known as: XANAX   Take 1 mg by mouth at bedtime as needed. anxiety      COMBIVENT RESPIMAT 20-100 MCG/ACT Aers respimat   Generic drug: Ipratropium-Albuterol   Inhale 1 puff into the lungs every 8 (eight) hours.      doxazosin 8 MG tablet   Commonly known as:  CARDURA   Take 8 mg by mouth at bedtime.      HYDROcodone-acetaminophen 10-325 MG per tablet   Commonly known as: NORCO   Take 1 tablet by mouth every 6 (six) hours as needed. For pain.      nebivolol 10 MG tablet   Commonly known as: BYSTOLIC   Take 10 mg by mouth daily.          The results of significant diagnostics from this hospitalization (including imaging, microbiology, ancillary and laboratory) are listed below for reference.    Significant Diagnostic Studies: Dg Chest Port 1 View  04/21/2012  *RADIOLOGY REPORT*  Clinical Data: Chest pain.  Altered mental status.  PORTABLE CHEST - 1 VIEW  Comparison: 04/18/2012 and 11/22/2009.  Findings: Post CABG.  Pulmonary vascular prominence superimposed on chronic changes.  Calcified tortuous aorta.  No infiltrate or pneumothorax.  IMPRESSION: Pulmonary vascular prominence superimposed upon chronic lung changes.  Tortuous aorta.  Post CABG.  Slight indistinctness left heart border unchanged compared to 2011 exam.   Original Report Authenticated By: Fuller Canada, M.D.    Portable Chest Xray  04/18/2012  *RADIOLOGY REPORT*  Clinical Data: Respiratory distress, chest pressure  PORTABLE CHEST - 1 VIEW  Comparison: Air 11/22/2009  Findings: Mild cardiomegaly.  Mild to moderate diffuse interstitial pattern representing change from the prior study.  No focal consolidation or pleural effusion.  The patient is status post CABG.  IMPRESSION: Findings suggest acute pulmonary edema.   Original Report Authenticated By: Otilio Carpen, M.D.     Microbiology: Recent Results (from the past 240 hour(s))  MRSA PCR SCREENING     Status: Normal   Collection Time   04/18/12  4:11 PM      Component Value Range Status Comment   MRSA by PCR NEGATIVE  NEGATIVE Final   URINE CULTURE     Status: Normal   Collection Time   04/19/12  4:38 AM      Component Value Range Status Comment   Specimen Description URINE, CLEAN CATCH   Final    Special Requests  NONE   Final    Culture  Setup Time 04/19/2012 08:37   Final    Colony Count NO GROWTH   Final    Culture NO GROWTH   Final    Report Status 04/20/2012 FINAL   Final   URINE CULTURE     Status: Normal   Collection Time   04/21/12 11:41 AM      Component Value Range Status Comment   Specimen Description URINE, CLEAN CATCH   Final    Special Requests NONE   Final    Culture  Setup Time 04/22/2012 01:39   Final    Colony Count 20,OOO COLONIES/ML   Final    Culture     Final    Value: Multiple bacterial morphotypes present, none predominant. Suggest appropriate recollection if clinically indicated.   Report Status 04/23/2012 FINAL   Final      Labs: Basic Metabolic Panel:   Liver Function  Tests:  Lab 04/18/12 1605  AST 22  ALT 17  ALKPHOS 64  BILITOT 0.3  PROT 7.3  ALBUMIN 3.9   CBC:   Cardiac Enzymes:  Lab 04/21/12 1136 04/19/12 0321 04/18/12 2117 04/18/12 1605  CKTOTAL -- -- -- --  CKMB -- -- -- --  CKMBINDEX -- -- -- --  TROPONINI <0.30 <0.30 <0.30 <0.30   BNP: BNP (last 3 results)  Basename 04/23/12 0437 04/21/12 1136 04/18/12 1605  PROBNP 1636.0* 2641.0* 4150.0*    Time coordinating discharge: Patient left AMA  Signed:  Hollice Espy  [facility] 04/25/2012, 12:49 PM

## 2012-04-25 NOTE — Discharge Instructions (Signed)
Heart Failure  Heart failure means your heart has trouble pumping blood. The blood is not circulated very well in your body because your heart is weak. Heart failure may cause blood to back up into your lungs. This is commonly called "fluid in the lungs." Heart failure may also cause your ankles and legs to puff up (swell). It is important to take good care of yourself when you have heart failure.  HOME CARE  Medicine   Take your medicine as told by your doctor.   Do not stop taking your medicine unless told to by your doctor.   Be sure to get your medicine refilled before it runs out.   Do not skip any doses of medicine.   Tell your doctor if you cannot afford your medicine.   Keep a list of all the medicine you take. This should include the name, how much you take, and when you take it.   Ask your doctor if you have any questions about your medicine. Do not take over-the-counter medicine unless your doctor says it is okay.  What you eat   Do not drink alcohol unless your doctor says it is okay.   Avoid food that is high in fat. Avoid foods fried in oil or made with fat.   Eat a healthy diet. A dietitian can help you with healthy food choices.   Limit how much salt you eat. Do not eat more than 1500 mg of salt (sodium) a day.   Do not add salt to your food.   Do not eat food made with a lot of salt. Here are some examples:   Canned vegetables.   Canned soups.   Canned drinks.   Hot dogs.   Fast food.   Pizza.   Chips.  Check your weight   Weigh yourself every morning. You should do this after you pee (urinate) and before you eat breakfast.   Wear the same amount of clothes each time you weigh yourself.   Write down your weight every day. Tell your doctor if you gain 3 lb/1.4 kg or more in 1 day or 5 lb/2.3 kg in a week.  Blood pressure monitoring   Buy a home blood pressure cuff.   Check your blood pressure as told by your doctor. Write down your blood pressure numbers on a sheet of paper.    Bring your blood pressure numbers to your doctor visits.  Smoking   Smoking is bad for your heart.   Ask your doctor how to stop smoking.  Exercise   Talk to your doctor about exercise.   Ask how much exercise is right for you.   Exercise as much as you can. Stop if you feel tired, have problems breathing, or have chest pain.  Keep all your doctor appointments.  GET HELP IF:    You gain 3 lb/1.4 kg or more in 1 day or 5 lb/2.3 kg in a week.   You have trouble breathing.   You have a cough that does not go away.   You have puffy ankles or legs.   You have an enlarged (bloated) belly (abdomen).   You cannot sleep because you have trouble breathing.  GET HELP RIGHT AWAY IF:    You pass out (faint).   You have really bad chest pain or pressure. This includes pain or pressure in your:   Arms.   Jaw.   Neck.   Back.  MAKE SURE YOU:    Understand   these instructions.   Will watch your condition.   Will get help right away if you are not doing well or get worse.  Document Released: 04/06/2008 Document Revised: 12/28/2011 Document Reviewed: 10/29/2008  ExitCare Patient Information 2013 [employer].

## 2012-04-25 NOTE — Discharge Summary (Signed)
Physician Discharge Summary  KIEN MIRSKY NWG:[id number] DOB: 1946-01-01 DOA: 04/21/2012  PCP: Hayden Rasmussen, NP  Admit date: 04/21/2012 Discharge date: 04/25/2012  Recommendations for Outpatient Follow-up:  1. Patient will followup with cardiology in the next few weeks. 2. Lasix and an ACE inhibitor are being added to his medical regimen 3. Patient will complete 2 more days of by mouth antibiotics to finish treatment of his urinary tract infection    Discharge Condition: Improved, being discharged home  Diet recommendation: Heart healthy  Filed Weights   04/23/12 0543 04/24/12 0500 04/25/12 0500  Weight: 88.497 kg (195 lb 1.6 oz) 89.1 kg (196 lb 6.9 oz) 89.7 kg (197 lb 12 oz)    History of present illness:  66 year old white male past oral history COPD, prostate cancer status post resection and CAD status post CABG presented on 10/8 [place] pulmonary office and was complaining of severe chest pain or shortness of breath and was transferred to the hospital where he was found to be in acute congestive heart failure. Initially on the critical care service and workup noted an echo with ejection fraction of 25%. Patient was stabilized, diuresed and transferred to the hospitalist service on 10/10. He became confused on morphine and Ativan and left AMA with no memory of this after on the evening of 10/10. Patient was readmitted on 10/11 quite upset that he was amnestic as to what happened.  Hospital Course Discharge Diagnoses:  ADENOCARCINOMA, PROSTATE: : Status post resection.was having some painful and dysuria several days ago, but this looks to have resolved.   CORONARY ARTERY DISEASE: Patient is status post cardiac catheterization. Grafted vessels looked to be patent. He still has triple vessel disease which is managed medically. Cardio he recommended continuing other medications plus restarting Lasix and adding ACE inhibitor. Will follow up with him in the office.  COPD (chronic  obstructive pulmonary disease): Patient has been put on aggressive nebulizers. His breathing comfortably and is able to ambulate down the hallways without wheezing. And on room air.  Acute respiratory failure with hypoxia: Now resolved.  See above.  Acute systolic CHF: During patient's previous aspiration, he was diuresed and then upon readmission, Lasix was put on hold in terms of apparent his kidneys for cardiac catheterization. His BNP on 10/11 was 0641 and on 10/13 was 1636. Extremities: Breathing comfortably and ambulating on room air. The plan will be for him to resume by mouth Lasix and he will be discharged.  Dysuria: Have high suspicion that the patient is having kidney stones. No evidence of obstruction. Treat symptomatically and control an outpatient urology followup  CKD stage I: Baseline from 1.14 and today it is up to 1.35, Crt didn't change the day after cath and is considered stable.   UTI: In this hospitalization and the one previous, has completed 6 of 7 days of treatment for his UTI.   Procedures: cardiac catheterization done 04/24/12:Left ventriculography: Left ventricular systolic function is mildly reduced. Assessment is somewhat complicated by PVC beats, but the inferior wall appears hypokinetic. The other segments appear normal. I would estimate the left ventricular ejection fraction at 45%.  Final Conclusions:  1. Severe three-vessel coronary artery disease  2. 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  3. Moderate LV dysfunction as above  4. Normal intracardiac hemodynamics   not automatically included, echos, thoracentesis, etc; not x-rays)  Consultations: [facility] Dear  [facility] [facility]  Discharge Exam: Filed Vitals:   04/24/12 2112 04/25/12 0445 04/25/12 0500 04/25/12 0835  BP: 123/74 169/81    Pulse: 65 63    Temp: 98.2 F  (36.8 C) 98.1 F (36.7 C)    TempSrc: Oral Oral    Resp: 18 18    Height:      Weight:   89.7 kg (197 lb 12 oz)   SpO2: 95% 100%  96%    General: Alert and oriented x3, no acute distress, looks younger than stated age Cardiovascular: Regular rate and rhythm, S1-S2, 2/6 systolic ejection murmur Respiratory: Clear to auscultation bilaterally-decreased breath sounds throughout Abdomen: Soft, obese, nontender, positive bowel sounds Extremities: No clubbing or cyanosis, trace pitting edema  Discharge Instructions  Discharge Orders    Future Orders Please Complete By Expires   Diet - low sodium heart healthy      Increase activity slowly          Medication List     As of 04/25/2012 12:28 PM    TAKE these medications         albuterol 108 (90 BASE) MCG/ACT inhaler   Commonly known as: PROVENTIL HFA;VENTOLIN HFA   Inhale 2 puffs into the lungs every 6 (six) hours as needed. Shortness of breath      ALPRAZolam 1 MG tablet   Commonly known as: XANAX   Take 1 mg by mouth at bedtime as needed. anxiety      aspirin 325 MG tablet   Take 1 tablet (325 mg total) by mouth daily.      atorvastatin 40 MG tablet   Commonly known as: LIPITOR   Take 1 tablet (40 mg total) by mouth daily at 6 PM.      cefUROXime 500 MG tablet   Commonly known as: CEFTIN   Take 1 tablet (500 mg total) by mouth 2 (two) times daily.      COMBIVENT RESPIMAT 20-100 MCG/ACT Aers respimat   Generic drug: Ipratropium-Albuterol   Inhale 1 puff into the lungs every 8 (eight) hours.      doxazosin 8 MG tablet   Commonly known as: CARDURA   Take 8 mg by mouth at bedtime.      furosemide 20 MG tablet   Commonly known as: LASIX   Take 1 tablet (20 mg total) by mouth daily.      HYDROcodone-acetaminophen 10-325 MG per tablet   Commonly known as: NORCO   Take 1 tablet by mouth every 6 (six) hours as needed. For pain.      lisinopril 10 MG tablet   Commonly known as: PRINIVIL,ZESTRIL   Take 1 tablet (10  mg total) by mouth daily.      nebivolol 10 MG tablet   Commonly known as: BYSTOLIC   Take 10 mg by mouth daily.           Follow-up Information    Follow up with MABE,DAVID, NP. In 1 month.   Contact information:   [address] [place] [address] [phone]       Follow up with Jethro Bolus I, MD. Schedule an appointment as soon as possible for a visit in 1 month. (As needed if symptoms worsen)    Contact information:   [address] [place] [address] [phone]       Follow up with Shan Levans, MD. Schedule an appointment as soon as possible for a visit in 2  weeks. (Lung Doctor)    Contact information:   [address] [place] [address] [phone]       Follow up with Charlton Haws, MD. Schedule an appointment as soon as possible for a visit in 2 weeks. (Heart Doctor)    Contact information:   [address] [place] [address] [phone]           The results of significant diagnostics from this hospitalization (including imaging, microbiology, ancillary and laboratory) are listed below for reference.    Significant Diagnostic Studies: Dg Chest Port 1 View  04/21/2012   IMPRESSION: Pulmonary vascular prominence superimposed upon chronic lung changes.  Tortuous aorta.  Post CABG.  Slight indistinctness left heart border unchanged compared to 2011 exam.   Original Report Authenticated By: Fuller Canada, M.D.    Portable Chest Xray  04/18/2012  IMPRESSION: Findings suggest acute pulmonary edema.   Original Report Authenticated By: Otilio Carpen, M.D.     Labs: Basic Metabolic Panel:  Lab 04/25/12 5784 04/24/12 0410 04/23/12 6962 04/22/12 0453 04/21/12 1136 04/19/12 1647 04/19/12 0321 04/18/12 1605  NA 137 136 136 137 135 -- -- --  K 4.2 4.2 4.0 3.9 3.7 -- -- --  CL 101 100 101 100 100 -- --  --  CO2 27 27 26 26 25  -- -- --  GLUCOSE 97 99 102* 82 95 -- -- --  BUN 28* 29* 28* 20 20 -- -- --  CREATININE 1.34 1.34 1.35 1.25 1.21 -- -- --  CALCIUM 9.4 9.3 9.4 9.2 9.2 -- -- --  MG -- -- -- -- -- 2.0 2.0 2.2  PHOS -- -- -- -- -- -- -- 3.3   Liver Function Tests:  Lab 04/18/12 1605  AST 22  ALT 17  ALKPHOS 64  BILITOT 0.3  PROT 7.3  ALBUMIN 3.9   CBC:  Lab 04/24/12 0410 04/21/12 1136 04/20/12 0405 04/19/12 0321 04/18/12 1605  WBC 7.0 5.8 6.4 5.9 7.5  NEUTROABS -- -- -- -- 5.1  HGB 14.5 14.8 13.9 13.4 14.5  HCT 42.7 43.3 42.6 39.5 42.4  MCV 91.2 91.5 92.6 92.5 91.4  PLT 191 170 188 191 189   Cardiac Enzymes:  Lab 04/21/12 1136 04/19/12 0321 04/18/12 2117 04/18/12 1605  CKTOTAL -- -- -- --  CKMB -- -- -- --  CKMBINDEX -- -- -- --  TROPONINI <0.30 <0.30 <0.30 <0.30   BNP: BNP (last 3 results)  Basename 04/23/12 0437 04/21/12 1136 04/18/12 1605  PROBNP 1636.0* 2641.0* 4150.0*    Time coordinating discharge: 30 minutes  Signed:  Hollice Espy  [facility] 04/25/2012, 12:28 PM

## 2012-04-26 NOTE — Progress Notes (Signed)
Lorazepam given IV at this time.  Pt continues to pace the halls insisting on being discharged.  MPenningtonRN

## 2012-04-26 NOTE — Progress Notes (Signed)
Patient is insistent on being discharged.  He is pacing the floors and insisting that the staff remove his IV lines and monitor.  Patient states,  "I can come back at another time and have this procedure."  Explained to him that if there is something wrong with his heart and he goes home without the cardiac catheter he could die.  He states, "So what, what's another heart attack, I've already had 12 or 13!"  MD notified and ordered to give patient Lorazepam and discontinue IV narcotics as he he has history of these meds making him agitated with erratic behavior.  He is able to answer all orientation questions but appears to be agitated and impatient with erratic behavior.  His sister was phoned in attempt to persuade him to stay and have cardiac cath in the am but this was unsuccessful.

## 2012-04-26 NOTE — Progress Notes (Signed)
Pt continues to demand to be discharged.  Paged on call MD and awaiting return page but patient is removing lines and monitor.  For safety, staff assisted with removing IV lines.  Instructed patient that a page has been placed so that the doctor can come up and talk to him and do any necessary paperwork needed but patient verbalizes that he is not waiting.  Staff is staying with patient in attempts to calm him down and encourage him to stay.   MPenningtonRN

## 2012-04-26 NOTE — Progress Notes (Signed)
Pt dressed and attempting to get on the elevator.  He has his belongings in his hands.  When asked if he has his car keys he pulled them out of his pocket.  He could not quite remember where his car was parked on campus and it was dark so security was called to assist him to his car.  AMA papers signed.  MPennington,RN

## 2012-04-27 SURGERY — LEFT AND RIGHT HEART CATHETERIZATION WITH CORONARY/GRAFT ANGIOGRAM
Anesthesia: LOCAL

## 2012-04-27 NOTE — Telephone Encounter (Signed)
PER PT HAD CATH ON 04-24-12 WITH DR Excell Seltzer .Zack Seal

## 2012-04-28 ENCOUNTER — Encounter (HOSPITAL_COMMUNITY): Payer: Self-pay | Admitting: Pharmacy Technician

## 2012-04-28 ENCOUNTER — Other Ambulatory Visit: Payer: Self-pay | Admitting: Urology

## 2012-04-28 ENCOUNTER — Encounter (HOSPITAL_COMMUNITY): Payer: Self-pay | Admitting: *Deleted

## 2012-04-28 NOTE — Progress Notes (Signed)
[facility] SURGERY INSTRUCTIONS REVIEWED WITH PT BY PHONE-INCLUDING CHLORHEXIDINE SHOWERS AND PRECAUTIONS.

## 2012-04-30 NOTE — H&P (Signed)
History of Present Illness  Mr. Gandia is a 66 year old gentleman with a history of prostate cancer status post a robotic prostatectomy a couple of years ago. His PSA fortunately has been undetectable. He now follows up with Dr. Patsi Sears for surveillance. At baseline, he has regained continence and had no voiding complaints up until approximately 2 weeks ago when he began having severe urinary frequency and urgency and difficulty emptying. He also has had some dysuria but denies any fever. He was hospitalized last week for a congestive heart failure exacerbation. His ejection fraction was initially estimated at 25% by echocardiogram but was felt to be more likely in the neighborhood of 45% after undergoing cardiac catheterization. He was treated medically and subsequently discharged. His symptoms have subsequently become more severe and he presented to the office today significant pain and discomfort. He currently is on ciprofloxacin after a urinalysis in the hospital demonstrated many bacteria. However, his urine culture was negative. He is due to complete his ciprofloxacin tomorrow. His evaluation today has included a renal and bladder ultrasound as well as a KUB x-ray. His x-ray demonstrated a large calcification of the bladder consistent with a probable large bladder calculus measuring 3 x 4 centimeters. In addition, his ultrasound demonstrates no hydronephrosis. Attempts to place a catheter earlier today were successful but only a small amount of urine returned and this did cause significant discomfort for him.   Past Medical History Problems  1. History of  Acute Myocardial Infarction V12.59 2. History of  Anxiety (Symptom) 300.00 3. History of  Arthritis V13.4 4. History of  Cath Stent Placement 5. History of  Heart Disease 429.9 6. History of  Hypercholesterolemia 272.0 7. History of  Hypertension 401.9 8. 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 History of   CABG (CABG) V45.81 2. History of  Neck Surgery 3. History of  Nephrectomy Left V45.73 4. History of  Prostatectomy Robotic-Assisted 5. History of  Shoulder Surgery  Current Meds 1. Cardura 8 MG Oral Tablet; Therapy: (Recorded:18Aug2008) to 2. Diovan HCT 160-12.5 MG Oral Tablet; Therapy: 15May2012 to 3. Norco 10-325 MG Oral Tablet; Therapy: (Recorded:18Aug2008) to 4. Xanax TABS; Therapy: (Recorded:06May2011) to  Allergies Medication  1. Morphine Derivatives 2. Ibuprofen CAPS  Family History Problems  1. Family history of  Death In The Family Mother 47, diabetic, chf 2. Paternal history of  Nephrolithiasis 3. Paternal history of  Renal Failure Denied  4. Family history of  Prostate Cancer  Social History Problems  1. Activities Of Daily Living 2. Exercise Habits Patient has been inactive for the past 3 months due to indwelling catheter but indicates that he does like to walk several times throughout the day. 3. Living Independently With Spouse 4. Marital History - Currently Married 5. Occupation: disabled 6. Self-reliant In Usual Daily Activities 7. Tobacco Use V15.82 smoked x 45 yrs  Vitals Vital Signs [Data Includes: Last 1 Day]  17Oct2013 03:34PM  Blood Pressure: 151 / 72 Temperature: 98.4 F Heart Rate: 64  Physical Exam Constitutional: Well nourished and well developed . No acute distress.  Genitourinary: Examination of the penis demonstrates no lesions and a normal meatus.    Results/Data  He was unable to provide a specimen.     Procedure  I recommended that he proceed with cystoscopy to confirm a bladder calculus. He was laid supine and he received intraurethral lidocaine gel. I then performed flexible cystoscopy which revealed a normal anterior urethra. At the level of the bladder  neck, a large bladder stone was identified and it did appear mobile and was able to push back into the bladder. Otherwise, visualization was somewhat obscured due to to cloudy  urine.     Assessment Assessed  1. Bladder Pain 788.99 2. Dysuria 788.1 3. Initiating Urination Requires Straining 788.65 4. Bladder Calculus 594.1  Plan Bladder Pain (788.99), Initiating Urination Requires Straining (788.65)  1. Cath, simple, wIinsert Temp Cath  Requested for: 17Oct2013 2. RENAL U/S COMPLETE Dysuria (788.1)  3. KUB  Done: 17Oct2013 12:00AM 4. PVR U/S 5. RENAL U/S RIGHT  Done: 17Oct2013 12:00AM  Discussion/Summary  1. Bladder calculus: I explained to Mr. Lenney that he does have a bladder calculus and that he will need to undergo cystolitholapaxy. He is very uncomfortable and would like to proceed with this as soon as possible. To help expedite things, Merton Border to perform this procedure for him and to see if we can add this on possibly for as soon as Monday. We reviewed the procedure in detail as well as the potential risks and complications including but not limited to bleeding, infection, damage to the bladder or urinary tract, new onset of incontinence, failure of the procedure, recurrent stone formation, et Karie Soda. We also discussed the risks related to his history of cardiac disease and anesthesia including heart attack, exacerbation of his CHF, stroke, venothromboembolism, death, etc. He understands the risks and expresses his desire to proceed. He gives his informed consent to proceed with cystolitholapaxy early next week.  Cc: Dr. Jethro Bolus     Signatures Electronically signed by : Heloise Purpura, M.D.; Apr 28 2012  9:58AM

## 2012-05-01 ENCOUNTER — Ambulatory Visit (HOSPITAL_COMMUNITY): Payer: Medicare Other | Admitting: Certified Registered Nurse Anesthetist

## 2012-05-01 ENCOUNTER — Observation Stay (HOSPITAL_COMMUNITY)
Admission: RE | Admit: 2012-05-01 | Discharge: 2012-05-02 | Disposition: A | Discharge status: Home / Self Care | Payer: Medicare Other | Source: Ambulatory Visit | Attending: Urology | Admitting: Urology

## 2012-05-01 ENCOUNTER — Encounter (HOSPITAL_COMMUNITY): Payer: Self-pay | Admitting: *Deleted

## 2012-05-01 ENCOUNTER — Encounter (HOSPITAL_COMMUNITY): Payer: Self-pay | Admitting: Certified Registered Nurse Anesthetist

## 2012-05-01 ENCOUNTER — Encounter (HOSPITAL_COMMUNITY)
Admission: RE | Disposition: A | Discharge status: Home / Self Care | Payer: Self-pay | Source: Ambulatory Visit | Attending: Urology

## 2012-05-01 DIAGNOSIS — Z9079 Acquired absence of other genital organ(s): Secondary | ICD-10-CM | POA: Insufficient documentation

## 2012-05-01 DIAGNOSIS — Z951 Presence of aortocoronary bypass graft: Secondary | ICD-10-CM | POA: Insufficient documentation

## 2012-05-01 DIAGNOSIS — Z79899 Other long term (current) drug therapy: Secondary | ICD-10-CM | POA: Insufficient documentation

## 2012-05-01 DIAGNOSIS — N210 Calculus in bladder: Principal | ICD-10-CM | POA: Insufficient documentation

## 2012-05-01 DIAGNOSIS — Z8551 Personal history of malignant neoplasm of bladder: Secondary | ICD-10-CM | POA: Insufficient documentation

## 2012-05-01 DIAGNOSIS — I252 Old myocardial infarction: Secondary | ICD-10-CM | POA: Insufficient documentation

## 2012-05-01 DIAGNOSIS — E785 Hyperlipidemia, unspecified: Secondary | ICD-10-CM | POA: Insufficient documentation

## 2012-05-01 DIAGNOSIS — I10 Essential (primary) hypertension: Secondary | ICD-10-CM | POA: Insufficient documentation

## 2012-05-01 HISTORY — DX: Unspecified osteoarthritis, unspecified site: M19.90

## 2012-05-01 HISTORY — PX: CYSTOSCOPY WITH LITHOLAPAXY: SHX1425

## 2012-05-01 LAB — BASIC METABOLIC PANEL
CO2: 25 mEq/L (ref 19–32)
Chloride: 104 mEq/L (ref 96–112)
Creatinine, Ser: 1.22 mg/dL (ref 0.50–1.35)
GFR calc Af Amer: 70 mL/min — ABNORMAL LOW (ref >90)
Potassium: 4.2 mEq/L (ref 3.5–5.1)

## 2012-05-01 LAB — SURGICAL PCR SCREEN
MRSA, PCR: NEGATIVE
Staphylococcus aureus: NEGATIVE

## 2012-05-01 SURGERY — CYSTOSCOPY, WITH BLADDER CALCULUS LITHOLAPAXY
Anesthesia: General | Site: Bladder | Wound class: Clean Contaminated

## 2012-05-01 MED ORDER — LACTATED RINGERS IV SOLN
INTRAVENOUS | Status: DC
  Administered 2012-05-01: 16:00:00 via INTRAVENOUS

## 2012-05-01 MED ORDER — LIDOCAINE HCL (CARDIAC) 20 MG/ML IV SOLN
INTRAVENOUS | Status: DC | PRN
  Administered 2012-05-01: 100 mg via INTRAVENOUS

## 2012-05-01 MED ORDER — FENTANYL CITRATE 0.05 MG/ML IJ SOLN
INTRAMUSCULAR | Status: AC
  Filled 2012-05-01: qty 2

## 2012-05-01 MED ORDER — IPRATROPIUM-ALBUTEROL 20-100 MCG/ACT IN AERS
1.0000 | INHALATION_SPRAY | Freq: Three times a day (TID) | RESPIRATORY_TRACT | Status: DC
  Administered 2012-05-01 – 2012-05-02 (×2): 1 via RESPIRATORY_TRACT

## 2012-05-01 MED ORDER — PROMETHAZINE HCL 25 MG/ML IJ SOLN: 6.2500 mg | INTRAMUSCULAR | Status: DC | PRN

## 2012-05-01 MED ORDER — ONDANSETRON HCL 4 MG/2ML IJ SOLN
INTRAMUSCULAR | Status: DC | PRN
  Administered 2012-05-01: 4 mg via INTRAVENOUS

## 2012-05-01 MED ORDER — MIDAZOLAM HCL 5 MG/5ML IJ SOLN
INTRAMUSCULAR | Status: DC | PRN
  Administered 2012-05-01: 2 mg via INTRAVENOUS

## 2012-05-01 MED ORDER — CIPROFLOXACIN IN D5W 400 MG/200ML IV SOLN
INTRAVENOUS | Status: AC
  Filled 2012-05-01: qty 200

## 2012-05-01 MED ORDER — ALBUTEROL SULFATE HFA 108 (90 BASE) MCG/ACT IN AERS
INHALATION_SPRAY | RESPIRATORY_TRACT | Status: DC | PRN
  Administered 2012-05-01: 3 via RESPIRATORY_TRACT

## 2012-05-01 MED ORDER — ALPRAZOLAM 1 MG PO TABS
1.0000 mg | ORAL_TABLET | Freq: Two times a day (BID) | ORAL | Status: DC | PRN
  Administered 2012-05-01 – 2012-05-02 (×2): 1 mg via ORAL
  Filled 2012-05-01 (×2): qty 1

## 2012-05-01 MED ORDER — HYDROCODONE-ACETAMINOPHEN 5-325 MG PO TABS
1.0000 | ORAL_TABLET | ORAL | Status: DC | PRN
  Administered 2012-05-02: 2 via ORAL
  Filled 2012-05-01: qty 2

## 2012-05-01 MED ORDER — CIPROFLOXACIN HCL 500 MG PO TABS: 500.0000 mg | ORAL_TABLET | Freq: Two times a day (BID) | ORAL | Status: DC

## 2012-05-01 MED ORDER — SODIUM CHLORIDE 0.9 % IJ SOLN: 3.0000 mL | Freq: Two times a day (BID) | INTRAMUSCULAR | Status: DC

## 2012-05-01 MED ORDER — FENTANYL CITRATE 0.05 MG/ML IJ SOLN
25.0000 ug | INTRAMUSCULAR | Status: DC | PRN
  Administered 2012-05-01 (×3): 50 ug via INTRAVENOUS

## 2012-05-01 MED ORDER — ATORVASTATIN CALCIUM 40 MG PO TABS
40.0000 mg | ORAL_TABLET | Freq: Every day | ORAL | Status: DC
  Filled 2012-05-01: qty 1

## 2012-05-01 MED ORDER — CIPROFLOXACIN IN D5W 400 MG/200ML IV SOLN
400.0000 mg | Freq: Two times a day (BID) | INTRAVENOUS | Status: DC
  Administered 2012-05-01: 400 mg via INTRAVENOUS
  Filled 2012-05-01 (×2): qty 200

## 2012-05-01 MED ORDER — BELLADONNA ALKALOIDS-OPIUM 16.2-60 MG RE SUPP
RECTAL | Status: AC
  Filled 2012-05-01: qty 1

## 2012-05-01 MED ORDER — SODIUM CHLORIDE 0.9 % IJ SOLN: 3.0000 mL | INTRAMUSCULAR | Status: DC | PRN

## 2012-05-01 MED ORDER — PROPOFOL 10 MG/ML IV EMUL
INTRAVENOUS | Status: DC | PRN
  Administered 2012-05-01: 400 mg via INTRAVENOUS
  Administered 2012-05-01: 100 mg via INTRAVENOUS

## 2012-05-01 MED ORDER — HYDROCODONE-ACETAMINOPHEN 5-325 MG PO TABS: 1.0000 | ORAL_TABLET | Freq: Four times a day (QID) | ORAL | Status: DC | PRN

## 2012-05-01 MED ORDER — ACETAMINOPHEN 10 MG/ML IV SOLN
INTRAVENOUS | Status: DC | PRN
  Administered 2012-05-01: 1000 mg via INTRAVENOUS

## 2012-05-01 MED ORDER — STERILE WATER FOR IRRIGATION IR SOLN
Status: DC | PRN
  Administered 2012-05-01: 6000 mL
  Administered 2012-05-01: 9000 mL

## 2012-05-01 MED ORDER — MIDAZOLAM HCL 5 MG/ML IJ SOLN
2.0000 mg | Freq: Once | INTRAMUSCULAR | Status: AC
  Administered 2012-05-01: 2 mg via INTRAVENOUS

## 2012-05-01 MED ORDER — MUPIROCIN 2 % EX OINT
TOPICAL_OINTMENT | Freq: Two times a day (BID) | CUTANEOUS | Status: DC
  Administered 2012-05-01: 1 via NASAL
  Filled 2012-05-01: qty 22

## 2012-05-01 MED ORDER — HYDROMORPHONE HCL PF 1 MG/ML IJ SOLN
0.5000 mg | INTRAMUSCULAR | Status: DC | PRN
Start: 2012-05-01 — End: 2012-05-02
  Administered 2012-05-01 – 2012-05-02 (×4): 1 mg via INTRAVENOUS
  Filled 2012-05-01 (×4): qty 1

## 2012-05-01 MED ORDER — IPRATROPIUM-ALBUTEROL 20-100 MCG/ACT IN AERS
1.0000 | INHALATION_SPRAY | Freq: Three times a day (TID) | RESPIRATORY_TRACT | Status: DC
  Filled 2012-05-01: qty 4

## 2012-05-01 MED ORDER — CIPROFLOXACIN IN D5W 400 MG/200ML IV SOLN
400.0000 mg | INTRAVENOUS | Status: AC
  Administered 2012-05-01: 400 mg via INTRAVENOUS

## 2012-05-01 MED ORDER — SODIUM CHLORIDE 0.9 % IR SOLN
Status: DC | PRN
  Administered 2012-05-01: 3000 mL via INTRAVESICAL

## 2012-05-01 MED ORDER — HYDROMORPHONE HCL PF 1 MG/ML IJ SOLN
INTRAMUSCULAR | Status: AC
  Filled 2012-05-01: qty 1

## 2012-05-01 MED ORDER — MIDAZOLAM HCL 10 MG/2ML IJ SOLN
INTRAMUSCULAR | Status: AC
  Administered 2012-05-01: 2 mg via INTRAVENOUS
  Filled 2012-05-01: qty 2

## 2012-05-01 MED ORDER — 0.9 % SODIUM CHLORIDE (POUR BTL) OPTIME
TOPICAL | Status: DC | PRN
  Administered 2012-05-01: 1000 mL

## 2012-05-01 MED ORDER — FUROSEMIDE 20 MG PO TABS
20.0000 mg | ORAL_TABLET | Freq: Every morning | ORAL | Status: DC
  Filled 2012-05-01: qty 1

## 2012-05-01 MED ORDER — HYDROMORPHONE HCL PF 1 MG/ML IJ SOLN
INTRAMUSCULAR | Status: AC
  Administered 2012-05-02: 1 mg
  Filled 2012-05-01: qty 1

## 2012-05-01 MED ORDER — ALBUTEROL SULFATE HFA 108 (90 BASE) MCG/ACT IN AERS
INHALATION_SPRAY | RESPIRATORY_TRACT | Status: AC
  Filled 2012-05-01: qty 6.7

## 2012-05-01 MED ORDER — ACETAMINOPHEN 10 MG/ML IV SOLN
INTRAVENOUS | Status: AC
  Filled 2012-05-01: qty 100

## 2012-05-01 MED ORDER — LISINOPRIL 10 MG PO TABS
10.0000 mg | ORAL_TABLET | Freq: Every morning | ORAL | Status: DC
  Filled 2012-05-01: qty 1

## 2012-05-01 MED ORDER — SODIUM CHLORIDE 0.9 % IV SOLN
250.0000 mL | INTRAVENOUS | Status: DC | PRN
  Administered 2012-05-01: 250 mL via INTRAVENOUS

## 2012-05-01 MED ORDER — HYDROMORPHONE HCL PF 1 MG/ML IJ SOLN
0.2500 mg | INTRAMUSCULAR | Status: DC | PRN
  Administered 2012-05-01: 0.25 mg via INTRAVENOUS
  Administered 2012-05-01: 0.5 mg via INTRAVENOUS
  Administered 2012-05-01: 0.25 mg via INTRAVENOUS

## 2012-05-01 MED ORDER — MIDAZOLAM HCL 10 MG/2ML IJ SOLN: 2.0000 mg | Freq: Once | INTRAMUSCULAR | Status: DC

## 2012-05-01 MED ORDER — ASPIRIN EC 81 MG PO TBEC
81.0000 mg | DELAYED_RELEASE_TABLET | Freq: Every morning | ORAL | Status: DC
  Filled 2012-05-01: qty 1

## 2012-05-01 MED ORDER — FENTANYL CITRATE 0.05 MG/ML IJ SOLN
INTRAMUSCULAR | Status: DC | PRN
  Administered 2012-05-01 (×6): 50 ug via INTRAVENOUS

## 2012-05-01 MED ORDER — NEBIVOLOL HCL 10 MG PO TABS
10.0000 mg | ORAL_TABLET | Freq: Every day | ORAL | Status: DC
  Administered 2012-05-01: 10 mg via ORAL
  Filled 2012-05-01 (×2): qty 1

## 2012-05-01 MED ORDER — ALBUTEROL SULFATE HFA 108 (90 BASE) MCG/ACT IN AERS
2.0000 | INHALATION_SPRAY | Freq: Four times a day (QID) | RESPIRATORY_TRACT | Status: DC | PRN
  Filled 2012-05-01: qty 6.7

## 2012-05-01 MED ORDER — MEPERIDINE HCL 50 MG/ML IJ SOLN: 6.2500 mg | INTRAMUSCULAR | Status: DC | PRN

## 2012-05-01 MED ORDER — DOXAZOSIN MESYLATE 8 MG PO TABS
8.0000 mg | ORAL_TABLET | Freq: Every day | ORAL | Status: DC
  Administered 2012-05-01: 8 mg via ORAL
  Filled 2012-05-01 (×2): qty 1

## 2012-05-01 MED ORDER — BELLADONNA ALKALOIDS-OPIUM 16.2-60 MG RE SUPP
1.0000 | Freq: Four times a day (QID) | RECTAL | Status: DC | PRN
  Administered 2012-05-01: 1 via RECTAL

## 2012-05-01 MED ORDER — DOCUSATE SODIUM 100 MG PO CAPS: 100.0000 mg | ORAL_CAPSULE | Freq: Two times a day (BID) | ORAL | Status: DC

## 2012-05-01 MED ORDER — LACTATED RINGERS IV SOLN: INTRAVENOUS | Status: DC

## 2012-05-01 SURGICAL SUPPLY — 29 items
ADAPTER CATH URET PLST 4-6FR (CATHETERS) ×1 IMPLANT
ADPR CATH URET STRL DISP 4-6FR (CATHETERS)
BAG URINE DRAINAGE (UROLOGICAL SUPPLIES) ×1 IMPLANT
BAG URO CATCHER STRL LF (DRAPE) ×2 IMPLANT
BASKET ZERO TIP NITINOL 2.4FR (BASKET) IMPLANT
BSKT STON RTRVL ZERO TP 2.4FR (BASKET)
CATH HEMA 3WAY 30CC 24FR COUDE (CATHETERS) ×1 IMPLANT
CATH INTERMIT  6FR 70CM (CATHETERS) IMPLANT
CLOTH BEACON ORANGE TIMEOUT ST (SAFETY) ×2 IMPLANT
DRAPE CAMERA CLOSED 9X96 (DRAPES) ×2 IMPLANT
ELECT REM PT RETURN 9FT ADLT (ELECTROSURGICAL) ×2
ELECTRODE REM PT RTRN 9FT ADLT (ELECTROSURGICAL) IMPLANT
GLOVE BIOGEL M STRL SZ7.5 (GLOVE) ×2 IMPLANT
GLOVE BIOGEL PI IND STRL 6.5 (GLOVE) IMPLANT
GLOVE BIOGEL PI INDICATOR 6.5 (GLOVE) ×2
GOWN PREVENTION PLUS XLARGE (GOWN DISPOSABLE) ×2 IMPLANT
GOWN STRL NON-REIN LRG LVL3 (GOWN DISPOSABLE) ×4 IMPLANT
GUIDEWIRE ANG ZIPWIRE 038X150 (WIRE) IMPLANT
GUIDEWIRE STR DUAL SENSOR (WIRE) ×1 IMPLANT
IV NS IRRIG 3000ML ARTHROMATIC (IV SOLUTION) ×1 IMPLANT
LASER FIBER DISP (UROLOGICAL SUPPLIES) ×2 IMPLANT
MANIFOLD NEPTUNE II (INSTRUMENTS) ×2 IMPLANT
NS IRRIG 1000ML POUR BTL (IV SOLUTION) ×1 IMPLANT
PACK CYSTO (CUSTOM PROCEDURE TRAY) ×2 IMPLANT
PLUG CATH AND CAP STER (CATHETERS) ×1 IMPLANT
PROBE EHL 9 FR 470CM (MISCELLANEOUS) IMPLANT
SYRINGE IRR TOOMEY STRL 70CC (SYRINGE) ×1 IMPLANT
TUBING CONNECTING 10 (TUBING) ×2 IMPLANT
WATER STERILE IRR 3000ML UROMA (IV SOLUTION) ×5 IMPLANT

## 2012-05-01 NOTE — Interval H&P Note (Signed)
History and Physical Interval Note:  05/01/2012 5:02 PM  Lucas [name]  has presented today for surgery, with the diagnosis of BLADDER CALCULUS  The various methods of treatment have been discussed with the patient and family. After consideration of risks, benefits and other options for treatment, the patient has consented to  Procedure(s) (LRB) with comments: CYSTOSCOPY WITH LITHOLAPAXY (N/A) HOLMIUM LASER APPLICATION (N/A) as a surgical intervention .  The patient's history has been reviewed, patient examined, no change in status, stable for surgery.  I have reviewed the patient's chart and labs.  Questions were answered to the patient's satisfaction.     Edison Nicholson,LES

## 2012-05-01 NOTE — Transfer of Care (Signed)
Immediate Anesthesia Transfer of Care Note  Patient: Lucas [name]  Procedure(s) Performed: Procedure(s) (LRB): CYSTOSCOPY WITH LITHOLAPAXY (N/A) HOLMIUM LASER APPLICATION (N/A)  Patient Location: PACU  Anesthesia Type: General  Level of Consciousness: sedated, patient cooperative and responds to stimulaton  Airway & Oxygen Therapy: Patient Spontanous Breathing and Patient connected to face mask oxgen  Post-op Assessment: Report given to PACU RN and Post -op Vital signs reviewed and stable  Post vital signs: Reviewed and stable  Complications: No apparent anesthesia complications

## 2012-05-01 NOTE — Op Note (Signed)
Preoperative diagnosis: Bladder calculus  Postoperative diagnosis: Bladder calculus  Procedure:  1. Cystolithalopaxy 2. Fulguration of bladder  Surgeon: Moody Bruins. M.D.  Anesthesia: General  Complications: None  Intraoperative findings: A large 3.5 cm bladder stone was identified within the bladder.  EBL: Minimal  Specimens: 1. Bladder calculus  Disposition of specimens: [facility] for stone analysis  Indication: Lucas [name]  is a 66 y.o. patient with a bladder calculus. After reviewing the management options for treatment, he elected to proceed with the above surgical procedure(s). We have discussed the potential benefits and risks of the procedure, side effects of the proposed treatment, the likelihood of the patient achieving the goals of the procedure, and any potential problems that might occur during the procedure or recuperation. Informed consent has been obtained.  Description of procedure:  The patient was taken to the operating room and general anesthesia was induced.  The patient was placed in the dorsal lithotomy position, prepped and draped in the usual sterile fashion, and preoperative antibiotics were administered. A preoperative time-out was performed.   Cystourethroscopy was performed.  The patient's urethra was examined and was normal except for the prostatic urethra which was surgically absent. The bladder was then systematically examined in its entirety. There was no evidence for any bladder tumors or other mucosal pathology. A large 3.5 cm bladder stone was freely mobile within the bladder.   A 550 micron holmium laser fiber was then used to fragment the stone on a setting of 0.5 J and 20 Hz until the stone was about 25% of its original size.  The lithotrite was then used to fragment the remainder of the stone.  All fragments were then removed with irrigation until the bladder was empty. The patient was on aspirin and there was  some mucosal bleeding which was identified.  This was away from the right ureteral orifice. This site was cauterized with the bugbee electrode.  The bladder was then emptied and the procedure ended.  The patient appeared to tolerate the procedure well and without complications.  The patient was able to be awakened and transferred to the recovery unit in satisfactory condition.   Moody Bruins MD

## 2012-05-01 NOTE — Anesthesia Postprocedure Evaluation (Signed)
  Anesthesia Post-op Note  Patient: Lucas [name]  Procedure(s) Performed: Procedure(s) (LRB): CYSTOSCOPY WITH LITHOLAPAXY (N/A) HOLMIUM LASER APPLICATION (N/A)  Patient Location: PACU  Anesthesia Type: General  Level of Consciousness: awake and alert   Airway and Oxygen Therapy: Patient Spontanous Breathing  Post-op Pain: mild  Post-op Assessment: Post-op Vital signs reviewed, Patient's Cardiovascular Status Stable, Respiratory Function Stable, Patent Airway and No signs of Nausea or vomiting  Post-op Vital Signs: stable  Complications: No apparent anesthesia complications

## 2012-05-01 NOTE — Preoperative (Addendum)
Beta Blockers   Reason not to administer Beta Blockers:Not Applicable Pt took Beta Blocker today 05-01-12 AM

## 2012-05-01 NOTE — Anesthesia Preprocedure Evaluation (Signed)
Anesthesia Evaluation  Patient identified by MRN, date of birth, ID band Patient awake    Reviewed: Allergy & Precautions, H&P , NPO status , Patient's Chart, lab work & pertinent test results  Airway Mallampati: II TM Distance: >3 FB Neck ROM: Full    Dental No notable dental hx.    Pulmonary neg pulmonary ROS, shortness of breath, COPD COPD inhaler, Current Smoker,  breath sounds clear to auscultation  Pulmonary exam normal       Cardiovascular hypertension, Pt. on medications + angina with exertion + CAD, + Past MI, + Cardiac Stents and + CABG Rhythm:Regular Rate:Normal     Neuro/Psych PSYCHIATRIC DISORDERS Anxiety negative neurological ROS  negative psych ROS   GI/Hepatic negative GI ROS, Neg liver ROS,   Endo/Other  negative endocrine ROS  Renal/GU negative Renal ROS  negative genitourinary   Musculoskeletal negative musculoskeletal ROS (+)   Abdominal   Peds negative pediatric ROS (+)  Hematology negative hematology ROS (+)   Anesthesia Other Findings   Reproductive/Obstetrics negative OB ROS                           Anesthesia Physical Anesthesia Plan  ASA: II  Anesthesia Plan: General   Post-op Pain Management:    Induction: Intravenous  Airway Management Planned: LMA  Additional Equipment:   Intra-op Plan:   Post-operative Plan: Extubation in OR  Informed Consent: I have reviewed the patients History and Physical, chart, labs and discussed the procedure including the risks, benefits and alternatives for the proposed anesthesia with the patient or authorized representative who has indicated his/her understanding and acceptance.   Dental advisory given  Plan Discussed with: CRNA  Anesthesia Plan Comments:         Anesthesia Quick Evaluation

## 2012-05-01 NOTE — Progress Notes (Signed)
Notified Dr Acey Lav of pts anxiety & received orders for Versed IV

## 2012-05-02 ENCOUNTER — Encounter (HOSPITAL_COMMUNITY): Payer: Self-pay | Admitting: Urology

## 2012-05-02 MED ORDER — HYDROMORPHONE HCL PF 1 MG/ML IJ SOLN
INTRAMUSCULAR | Status: AC
  Filled 2012-05-02: qty 1

## 2012-05-02 MED ORDER — HYDROMORPHONE HCL PF 1 MG/ML IJ SOLN: 1.0000 mg | INTRAMUSCULAR | Status: DC | PRN

## 2012-05-02 MED ORDER — PHENAZOPYRIDINE HCL 100 MG PO TABS: 100.0000 mg | ORAL_TABLET | Freq: Three times a day (TID) | ORAL | Status: DC | PRN

## 2012-05-02 NOTE — Discharge Summary (Signed)
Physician Discharge Summary  Patient ID: Lucas [name] MRN: [id number] DOB/AGE: Jul 20, 1945 66 y.o.  Admit date: 05/01/2012 Discharge date: 05/02/2012  Admission Diagnoses: Bladder calculus   Discharge Diagnoses:  Bladder calculus   Discharged Condition: good  Hospital Course: He underwent cystolithalopaxy on 05/01/12 with a large bladder stone removed.  He had a catheter left indwelling overnight.  He remained hemodynamically stable and his catheter was removed on POD#1.  He underwent a voiding trial and was discharged home that morning.   Disposition: 01-Home or Self Care     Medication List     As of 05/02/2012  7:11 AM    TAKE these medications         albuterol 108 (90 BASE) MCG/ACT inhaler   Commonly known as: PROVENTIL HFA;VENTOLIN HFA   Inhale 2 puffs into the lungs every 6 (six) hours as needed. Shortness of breath      ALPRAZolam 1 MG tablet   Commonly known as: XANAX   Take 1 mg by mouth 2 (two) times daily as needed. anxiety      aspirin EC 81 MG tablet   Take 81 mg by mouth every morning.      atorvastatin 40 MG tablet   Commonly known as: LIPITOR   Take 40 mg by mouth daily at 6 PM.      ciprofloxacin 500 MG tablet   Commonly known as: CIPRO   Take 1 tablet (500 mg total) by mouth 2 (two) times daily.      ciprofloxacin 500 MG/5ML (10%) suspension   Commonly known as: CIPRO   Take 500 mg by mouth 2 (two) times daily.      COMBIVENT RESPIMAT 20-100 MCG/ACT Aers respimat   Generic drug: Ipratropium-Albuterol   Inhale 1 puff into the lungs every 8 (eight) hours.      docusate sodium 100 MG capsule   Commonly known as: COLACE   Take 1 capsule (100 mg total) by mouth 2 (two) times daily.      doxazosin 8 MG tablet   Commonly known as: CARDURA   Take 8 mg by mouth at bedtime.      furosemide 20 MG tablet   Commonly known as: LASIX   Take 20 mg by mouth every morning.      HYDROcodone-acetaminophen 10-325 MG per tablet   Commonly known as:  NORCO   Take 1 tablet by mouth every 6 (six) hours as needed. For pain.      HYDROcodone-acetaminophen 5-325 MG per tablet   Commonly known as: NORCO/VICODIN   Take 1-2 tablets by mouth every 6 (six) hours as needed for pain.      lisinopril 10 MG tablet   Commonly known as: PRINIVIL,ZESTRIL   Take 10 mg by mouth every morning.      nebivolol 10 MG tablet   Commonly known as: BYSTOLIC   Take 10 mg by mouth at bedtime.      phenazopyridine 100 MG tablet   Commonly known as: PYRIDIUM   Take 1 tablet (100 mg total) by mouth 3 (three) times daily as needed for pain (for burning).           Follow-up Information    Follow up with Tyre Beaver,LES, MD. (05/18/12 at 3:45)    Contact information:   [address] [place] [address] [phone]          Signed: Crecencio Mc 05/02/2012, 7:11 AM

## 2012-05-02 NOTE — Progress Notes (Signed)
D/C instructions and scripts given to pt with f/u instructions. Out of dept via w/c with friend. dph

## 2012-05-02 NOTE — Care Management Note (Addendum)
    Page 1 of 1   05/02/2012     8:46:17 AM   CARE MANAGEMENT NOTE 05/02/2012  Patient:  ROCKWELL, ZENTZ   Account Number:  [id number]  Date Initiated:  05/02/2012  Documentation initiated by:  Lanier Clam  Subjective/Objective Assessment:   ADMITTED W/BLADDER CALCULUS     Action/Plan:   FROM HOME   Anticipated DC Date:  05/02/2012   Anticipated DC Plan:  HOME/SELF CARE      DC Planning Services  CM consult      Choice offered to / List presented to:             Status of service:  Completed, signed off Medicare Important Message given?   (If response is "NO", the following Medicare IM given date fields will be blank) Date Medicare IM given:   Date Additional Medicare IM given:    Discharge Disposition:  HOME/SELF CARE  Per UR Regulation:  Reviewed for med. necessity/level of care/duration of stay  If discussed at Long Length of Stay Meetings, dates discussed:    Comments:  05/02/12 Jonta Gastineau RN,BSN NCM [phone] POD#1 CYSTOLITHALOPAXY. D/C HOME NO D/C NEEDS.

## 2012-05-02 NOTE — Progress Notes (Signed)
Patient ID: Lucas [name], male   DOB: Oct 11, 1945, 66 y.o.   MRN: [id number]  1 Day Post-Op Subjective: Pt s/p cystolithalopaxy.  Catheter removed this morning.  No voids just yet.  Objective: Vital signs in last 24 hours: Temp:  [97 F (36.1 C)-98.4 F (36.9 C)] 97.4 F (36.3 C) (10/22 0615) Pulse Rate:  [48-84] 48  (10/22 0615) Resp:  [14-20] 18  (10/22 0615) BP: (105-176)/(40-92) 105/40 mmHg (10/22 0615) SpO2:  [93 %-100 %] 100 % (10/22 0615) Weight:  [89.359 kg (197 lb)] 89.359 kg (197 lb) (10/21 1506)  Intake/Output from previous day: 10/21 0701 - 10/22 0700 In: 2316.7 [I.V.:1716.7; IV Piggyback:200] Out: 1375 [Urine:1375] Intake/Output this shift:    Physical Exam:  General: Alert and oriented CV: RRR Lungs: Clear Abdomen: Soft, ND Urine: Clear overnight Ext: NT, No erythema   Assessment/Plan: -- Voiding trial this morning. -- D/C home after voids.   LOS: 1 day   Tamekia Rotter,LES 05/02/2012, 7:07 AM

## 2012-05-16 ENCOUNTER — Encounter: Payer: Self-pay | Admitting: Cardiovascular Disease

## 2012-05-16 ENCOUNTER — Ambulatory Visit (INDEPENDENT_AMBULATORY_CARE_PROVIDER_SITE_OTHER): Payer: Medicare Other | Admitting: Cardiovascular Disease

## 2012-05-16 VITALS — BP 172/97 | HR 73 | Ht 70.0 in | Wt 202.0 lb

## 2012-05-16 DIAGNOSIS — I10 Essential (primary) hypertension: Secondary | ICD-10-CM

## 2012-05-16 DIAGNOSIS — I5021 Acute systolic (congestive) heart failure: Secondary | ICD-10-CM

## 2012-05-16 DIAGNOSIS — J449 Chronic obstructive pulmonary disease, unspecified: Secondary | ICD-10-CM

## 2012-05-16 DIAGNOSIS — I2510 Atherosclerotic heart disease of native coronary artery without angina pectoris: Secondary | ICD-10-CM

## 2012-05-16 NOTE — Assessment & Plan Note (Signed)
Has been on [employer] for 19 days.  Baseline exp wheezing Continue inhalers  F/U pulmonary / IM

## 2012-05-16 NOTE — Assessment & Plan Note (Signed)
Euvolemic Continue current meds  Recent right heart with reasonable numbers.

## 2012-05-16 NOTE — Assessment & Plan Note (Signed)
Stable with no angina and good activity level.  Continue medical Rx Recent cath with stable graft anatomy

## 2012-05-16 NOTE — Progress Notes (Signed)
Patient ID: Lucas [name], male   DOB: [birth year], 66 y.o.   MRN: [id number]   Cath 04/21/12  Left ventriculography: Left ventricular systolic function is mildly reduced. Assessment is somewhat complicated by PVC beats, but the inferior wall appears hypokinetic. The other segments appear normal. I would estimate the left ventricular ejection fraction at 45%.  Final Conclusions:  1. Severe three-vessel coronary artery disease  2. 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  3. Moderate LV dysfunction as above  4. Normal intracardiac hemodynamics   ROS: Denies fever, malais, weight loss, blurry vision, decreased visual acuity, cough, sputum, SOB, hemoptysis, pleuritic pain, palpitaitons, heartburn, abdominal pain, melena, lower extremity edema, claudication, or rash.  All other systems reviewed and negative  General: Affect appropriate Overweight COPDer HEENT: normal Neck supple with no adenopathy JVP normal no bruits no thyromegaly Lungs clear with mild end expitory wheezing and good diaphragmatic motion Heart:  S1/S2 no murmur, no rub, gallop or click PMI normal Abdomen: benighn, BS positve, no tenderness, no AAA no bruit.  No HSM or HJR Distal pulses intact with no bruits No edema Neuro non-focal Skin warm and dry No muscular weakness   Current Outpatient Prescriptions  Medication Sig Dispense Refill  . albuterol (PROVENTIL HFA;VENTOLIN HFA) 108 (90 BASE) MCG/ACT inhaler Inhale 2 puffs into the lungs every 6 (six) hours as needed. Shortness of breath      . ALPRAZolam (XANAX) 1 MG tablet Take 1 mg by mouth 2 (two) times daily as needed. anxiety      . aspirin EC 81 MG tablet Take 81 mg by mouth every morning.      Marland Kitchen atorvastatin (LIPITOR) 40 MG tablet Take 40 mg by mouth daily at 6 PM.      . ciprofloxacin (CIPRO) 500 MG tablet Take 1 tablet (500 mg  total) by mouth 2 (two) times daily.  6 tablet  0  . docusate sodium (COLACE) 100 MG capsule Take 100 mg by mouth as needed.      . doxazosin (CARDURA) 8 MG tablet Take 8 mg by mouth at bedtime.       . furosemide (LASIX) 20 MG tablet Take 20 mg by mouth every morning.      Marland Kitchen HYDROcodone-acetaminophen (NORCO) 10-325 MG per tablet Take 1 tablet by mouth every 6 (six) hours as needed. For pain.      . Ipratropium-Albuterol (COMBIVENT RESPIMAT) 20-100 MCG/ACT AERS respimat Inhale 1 puff into the lungs every 8 (eight) hours.      Marland Kitchen lisinopril (PRINIVIL,ZESTRIL) 10 MG tablet Take 10 mg by mouth every morning.      . nebivolol (BYSTOLIC) 10 MG tablet Take 10 mg by mouth at bedtime.       . phenazopyridine (PYRIDIUM) 100 MG tablet Take 1 tablet (100 mg total) by mouth 3 (three) times daily as needed for pain (for burning).  20 tablet  0    Allergies  Ativan; Morphine and related; and Ibuprofen  Electrocardiogram:  Assessment and Plan

## 2012-05-16 NOTE — Patient Instructions (Signed)
Your physician wants you to follow-up in:  6 MONTHS WITH DR NISHAN  You will receive a reminder letter in the mail two months in advance. If you don't receive a letter, please call our office to schedule the follow-up appointment. Your physician recommends that you continue on your current medications as directed. Please refer to the Current Medication list given to you today. 

## 2012-05-16 NOTE — Assessment & Plan Note (Signed)
White coat syndrom Continue current meds.  Elevated today as he was upset about his xanax not being called in by primary care

## 2012-05-18 ENCOUNTER — Encounter: Payer: Medicare Other | Admitting: Pulmonary Disease

## 2012-05-19 NOTE — Progress Notes (Signed)
This encounter was created in error - please disregard.

## 2012-06-02 ENCOUNTER — Encounter: Payer: Self-pay | Admitting: Pulmonary Disease

## 2012-06-02 ENCOUNTER — Ambulatory Visit (INDEPENDENT_AMBULATORY_CARE_PROVIDER_SITE_OTHER): Payer: Medicare Other | Admitting: Pulmonary Disease

## 2012-06-02 VITALS — BP 140/82 | HR 83 | Temp 98.3°F | Ht 70.0 in | Wt 206.0 lb

## 2012-06-02 DIAGNOSIS — E669 Obesity, unspecified: Secondary | ICD-10-CM

## 2012-06-02 DIAGNOSIS — F17200 Nicotine dependence, unspecified, uncomplicated: Secondary | ICD-10-CM

## 2012-06-02 DIAGNOSIS — J449 Chronic obstructive pulmonary disease, unspecified: Secondary | ICD-10-CM

## 2012-06-02 NOTE — Patient Instructions (Addendum)
Please continue your current inhalers.   Please enrol in pulmonary rehabilitation and smoking cessation classes.   In the interim please call with any questions or changes in health status.

## 2012-06-05 ENCOUNTER — Encounter: Payer: Self-pay | Admitting: Pulmonary Disease

## 2012-06-05 DIAGNOSIS — E669 Obesity, unspecified: Secondary | ICD-10-CM | POA: Insufficient documentation

## 2012-06-05 NOTE — Progress Notes (Signed)
HPI  66 yo man with complex PMH significant for COPD, CAD s/p CABG followed by [facility] presents to the clinic for follow up on  COPD.  He was recently hospitalized and found to have a significant decline in his EF and a severe UTI related to a large cystic stone which was interim removed by urology. He recently underwent left and right heart catheterization;(results bellow)  He has not had recent COPD exacerbations, has not been hospitalized for COPD exacerbation over the past year; reports no antibiotics or steroid use.   Today he reports feeling well, denies acute changes in his cough and shortness of breath; he wears no oxygen; He denies fever, chills, chest pain, paroxysmal nocturnal dyspnea or lower extrmity edema. He continues to smoke, 1 pack over several days and uses electronic cigarettes. Takes his inhalers daily. Combivent twice daily and albuterol as needed especially before exercise. He states that he is doing great, the best he has felt in several years.      Past Medical History  Diagnosis Date  . CAD (coronary artery disease)   . Hypertension   . Anxiety   . History of kidney cancer   . ADENOCARCINOMA, PROSTATE   . Adjustment disorder with anxiety   . Hyperlipidemia   . BPH (benign prostatic hypertrophy)   . COPD (chronic obstructive pulmonary disease)   . CAD (coronary artery disease)   . Myocardial infarction   . Shortness of breath   . Arthritis    Allergies  Allergen Reactions  . Ativan (Lorazepam) Other (See Comments)    Increases agitation  . Morphine And Related     hallucinations  . Ibuprofen Rash    Tolerates aspirin per patient.     Past Surgical History  Procedure Date  . Coronary artery bypass graft     x 5  . Cardiac catheterization   . Nephrectomy     left nephrectomy for ca  . Posterior cervical laminectomy     x 8   limited ROM  and can't lie flat  . Heart stents     x 5  . Robot assisted laparoscopic radical prostatectomy    for prostate cancer  . Cystoscopy with litholapaxy 05/01/2012    Procedure: CYSTOSCOPY WITH LITHOLAPAXY;  Surgeon: Crecencio Mc, MD;  Location: [facility];  Service: Urology;  Laterality: N/A;   Family History  Problem Relation Age of Onset  . Diabetes Mother   . Coronary artery disease    . Heart disease Mother   . Heart disease Father   . Heart disease Brother   . Diabetes Father    History   Social History  . Marital Status: Widowed    Spouse Name: N/A    Number of Children: N/A  . Years of Education: N/A   Occupational History  . disabled    Social History Main Topics  . Smoking status: Current Some Day Smoker -- 0.3 packs/day for 54 years    Types: Cigarettes  . Smokeless tobacco: Former Neurosurgeon    Quit date: 04/19/2012     Comment: Using e-cig now  . Alcohol Use: No  . Drug Use: No  . Sexually Active: None   Other Topics Concern  . None   Social History Narrative    He lives in [place] by himself.  He is a widower.  His wife died about 10 months ago.  He continues to smoke about 4 or 5  cigarettes a day but has a greater than  100 pack-year history having  previously smoked up to 3-4 packs a day over the span about 48 years.  He denies alcohol or drug use.  He is retired secondary to disability  since the 99s.    Current Outpatient Prescriptions  Medication Sig Dispense Refill  . albuterol (PROVENTIL HFA;VENTOLIN HFA) 108 (90 BASE) MCG/ACT inhaler Inhale 2 puffs into the lungs every 6 (six) hours as needed. Shortness of breath      . ALPRAZolam (XANAX) 1 MG tablet Take 1 mg by mouth 2 (two) times daily as needed. anxiety      . aspirin EC 81 MG tablet Take 81 mg by mouth every morning.      Marland Kitchen doxazosin (CARDURA) 8 MG tablet Take 8 mg by mouth at bedtime.       Marland Kitchen HYDROcodone-acetaminophen (NORCO) 10-325 MG per tablet Take 1 tablet by mouth every 6 (six) hours as needed. For pain.      . Ipratropium-Albuterol (COMBIVENT RESPIMAT) 20-100 MCG/ACT AERS respimat Inhale 1 puff  into the lungs every 8 (eight) hours.      Marland Kitchen lisinopril (PRINIVIL,ZESTRIL) 10 MG tablet Take 10 mg by mouth every morning.      . nebivolol (BYSTOLIC) 10 MG tablet Take 10 mg by mouth at bedtime.       . CRESTOR 20 MG tablet Take 1 tablet by mouth Daily.        Review of Systems  Constitutional: Negative for fever, appetite change and unexpected weight change.  HENT: Negative for ear pain, congestion, sore throat, sneezing, trouble swallowing and dental problem.  Respiratory: Positive for cough and shortness of breath.  Cardiovascular: Negative  for chest pain. Negative for palpitations and leg swelling.  Gastrointestinal: Negative for abdominal pain.  Musculoskeletal: Negative for joint swelling.  Skin: Negative for rash.  Neurological: Negative for headaches.  Psychiatric/Behavioral: Negative for dysphoric mood. The patient is not nervous/anxious.    Objective:   Physical Exam  BP 140/82  Pulse 83  Temp 98.3 F (36.8 C)  Ht 5\' 10"  (1.778 m)  Wt 206 lb (93.441 kg)  BMI 29.56 kg/m2  SpO2 96%  General: moving around in no distress; Obese  HEENT ; pupils round and reactive to light; mild nasal mucosa edema and erythema; retropharyngeal crowding with no edema or exudate; no cervical LAD  Cardiovascular: s1s2, no murmurs. Regular rate and rhythm.  Respiratory: Decreased breath sounds bilaterally without wheezing No increased work of breathing.  Abdomen: soft NT ND BS+, obese.  Extremities: no pedal edema. Homans negative  Central nervous system: Alert and oriented No focal neurological deficits  Spirometry 04/18/12       06/02/12 FEV1 1.64L 46%             1.94  55%   FVC 2.78L 60%               3.01  65% FEV1/FVC 59                   64   CXR last available 2011; personally reviewed  Findings: Prior sternotomy for CABG. Heart mildly enlarged but  stable. Pulmonary venous hypertension without overt edema.  Prominent bronchovascular markings diffusely and central    peribronchial thickening, unchanged. No new pulmonary parenchymal  abnormalities. No visible pleural effusions.  IMPRESSION:  Stable mild cardiomegaly and COPD. No acute cardiopulmonary  disease.     Cardiac Cath 11/13 Left ventriculography: Left ventricular systolic function is mildly reduced. Assessment is somewhat complicated by  PVC beats, but the inferior wall appears hypokinetic. The other segments appear normal. I would estimate the left ventricular ejection fraction at 45%.  Final Conclusions:  1. Severe three-vessel coronary artery disease  2. 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  3. Moderate LV dysfunction as above  4. Normal intracardiac hemodynamicsEKG in clinic difficult to interpret     Assessment & Plan:    1. COPD moderate (FEV1 55%) by spirometric criteria, a restrictive process likely, for confirmation the patient would need to have complete pulmonary function tests with lung volumes, CAT 11, COPD class B.  Evaluation of oxygen requirements with ambulation, the patient does not require oxygen supplentation. I advised him to continue current bronchodilators and to enroll in pulmonary rehabilitation.   2. Obesity I advised him to continue his efforts to loose weight and remain active.   3. Smoking cessation counseling performed in clinic today; I advised him against using electronic cigarettes and to enrol in smoking cessation classes through our pulmonary rehabilitation program.   4. General health up to date on flu vaccination. He will need pneumococcus vaccine if not received. Can be obtained to his primary care office.   5. Disposition return to clinic in 3 months.

## 2012-07-18 ENCOUNTER — Telehealth: Payer: Self-pay | Admitting: *Deleted

## 2012-07-18 NOTE — Telephone Encounter (Signed)
RECEIVED CALL FROM [name]  [facility] RE  PT'S ELEVATED B/P  210/108 PT NOT COMPLIANT WITH  MEDS HAS NOT BEEN TAKING BYSTOLIC  ON REGULAR BASIS ALSO CONFESSED TO  PA  HAS BEEN DRINKING AND SMOKING A LOT . CHECKED ON PT  PER PT WILL START TAKING BYSTOLIC  AND WILL MONITOR  B/P ALSO PER PT HAS BEEN EATING HAM , SAUSAGE, AND OTHER PROCESSED MEATS WILL  START WATCHING DIET BETTER  PT TO CALL WITH B/P READINGS IN APPROX A WEEK .Lucas [name]

## 2012-08-01 NOTE — Telephone Encounter (Signed)
CALLED PT TO GET UPDATE WITH B/P  PER PT  CHECKING   B/P AT DRUG STORE  AND  B/P HAS BEEN RUNNING 145/65 HAS BEEN TAKING MEDS AS DIRECTED AND HAS BEEN WATCHING DIET  WILL CONT  TO MONITOR .Lucas [name]

## 2013-03-19 ENCOUNTER — Ambulatory Visit
Admission: RE | Admit: 2013-03-19 | Discharge: 2013-03-19 | Disposition: A | Discharge status: Home / Self Care | Payer: PRIVATE HEALTH INSURANCE | Source: Ambulatory Visit | Attending: Internal Medicine | Admitting: Internal Medicine

## 2013-03-19 ENCOUNTER — Other Ambulatory Visit: Payer: Self-pay | Admitting: Internal Medicine

## 2013-03-19 DIAGNOSIS — R0602 Shortness of breath: Secondary | ICD-10-CM

## 2013-03-19 DIAGNOSIS — J449 Chronic obstructive pulmonary disease, unspecified: Secondary | ICD-10-CM

## 2013-03-26 ENCOUNTER — Ambulatory Visit (INDEPENDENT_AMBULATORY_CARE_PROVIDER_SITE_OTHER): Payer: Medicaid Other | Admitting: Internal Medicine

## 2013-03-26 ENCOUNTER — Encounter: Payer: Self-pay | Admitting: Internal Medicine

## 2013-03-26 ENCOUNTER — Telehealth: Payer: Self-pay | Admitting: Internal Medicine

## 2013-03-26 VITALS — BP 160/90 | HR 79 | Temp 97.8°F | Ht 68.0 in | Wt 211.0 lb

## 2013-03-26 DIAGNOSIS — Z87891 Personal history of nicotine dependence: Secondary | ICD-10-CM

## 2013-03-26 DIAGNOSIS — J449 Chronic obstructive pulmonary disease, unspecified: Secondary | ICD-10-CM

## 2013-03-26 DIAGNOSIS — J441 Chronic obstructive pulmonary disease with (acute) exacerbation: Secondary | ICD-10-CM | POA: Insufficient documentation

## 2013-03-26 DIAGNOSIS — F17200 Nicotine dependence, unspecified, uncomplicated: Secondary | ICD-10-CM

## 2013-03-26 MED ORDER — DOXYCYCLINE CALCIUM 50 MG/5ML PO SYRP: 100.0000 mg | ORAL_SOLUTION | Freq: Two times a day (BID) | ORAL | Status: DC

## 2013-03-26 MED ORDER — ALBUTEROL SULFATE (2.5 MG/3ML) 0.083% IN NEBU: 2.5000 mg | INHALATION_SOLUTION | Freq: Every day | RESPIRATORY_TRACT | Status: DC | PRN

## 2013-03-26 MED ORDER — PREDNISONE 10 MG PO TABS: ORAL_TABLET | ORAL | Status: DC

## 2013-03-26 NOTE — Assessment & Plan Note (Signed)
#  COPD Flare up - Take doxycycline 100mg  po twice daily x 5 days; take after meals and avoid sunlight  - ask pharmacy for liquid form - Take prednisone 40 mg daily x 2 days, then 20mg  daily x 2 days, then 10mg  daily x 2 days, then 5mg  daily x 2 days and stop

## 2013-03-26 NOTE — Progress Notes (Signed)
Subjective:    Patient ID: Lucas [name], male    DOB: 01/23/1946, 67 y.o.   MRN: [id number]  HPI 67 yo man with complex PMH significant for COPD, CAD s/p CABG followed by [facility] presents to the clinic for follow up on  COPD.   OV .06/02/2012 with Dr Frederico Hamman He was recently hospitalized and found to have a significant decline in his EF and a severe UTI related to a large cystic stone which was interim removed by urology. He recently underwent left and right heart catheterization;(results bellow)  He has not had recent COPD exacerbations, has not been hospitalized for COPD exacerbation over the past year; reports no antibiotics or steroid use.   Today he reports feeling well, denies acute changes in his cough and shortness of breath; he wears no oxygen; He denies fever, chills, chest pain, paroxysmal nocturnal dyspnea or lower extrmity edema. He continues to smoke, 1 pack over several days and uses electronic cigarettes. Takes his inhalers daily. Combivent twice daily and albuterol as needed especially before exercise. He states that he is doing great, the best he has felt in several years.      Spirometry 04/18/12       06/02/12 FEV1 1.64L 46%             1.94  55%   FVC 2.78L 60%               3.01  65% FEV1/FVC 59                   64   CXR last available 2011; personally reviewed  Findings: Prior sternotomy for CABG. Heart mildly enlarged but  stable. Pulmonary venous hypertension without overt edema.  Prominent bronchovascular markings diffusely and central  peribronchial thickening, unchanged. No new pulmonary parenchymal  abnormalities. No visible pleural effusions.  IMPRESSION:  Stable mild cardiomegaly and COPD. No acute cardiopulmonary  disease.     Cardiac Cath 11/13 Left ventriculography: Left ventricular systolic function is mildly reduced. Assessment is somewhat complicated by PVC beats, but the inferior wall appears hypokinetic. The other segments appear  normal. I would estimate the left ventricular ejection fraction at 45%.  Final Conclusions:  1. Severe three-vessel coronary artery disease  2. 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  3. Moderate LV dysfunction as above  4. Normal intracardiac hemodynamicsEKG in clinic difficult to interpret   REC   Please continue your current inhalers.  Please enrol in pulmonary rehabilitation and smoking cessation classes.  In the interim please call with any questions or changes in health status.        OV 03/26/2013 FU smoking and COPD Gold stage II in setting of Obesity, CAD with Systolic CHF  SMoking: He initially told me that he quit smoking found out that he is actually smoking electronic cigarettes. He is unaware that the sclerae significant pulmonary risk. In addition to significant passive smoking going on at home because of  spouse. He is definitely motivated to quit tobacco altogether. He says that he'll go and get the nicotine patch    COPD: He is completely confused about his medications. He is taking Combivent MDI several times daily along with albuterol several times daily. He has not heard from pulmonary rehabilitation but is interested. Current problem is that the last few to several days he's having increasing  cough compared to baseline, increased wheeze compared to baseline, increased sputum volume compared to baseline and slight change in color of sputum from white and clear to thick white. Several days ago while in bed in the morning he felt acute shortness of breath and smothering sensation. It is after that all his above symptoms started. There is no history of chest pain, edema, hemoptysis but he definitely has orthopnea. COPD cat score is 29 and reflects high symptom burden.  He is very keen that he needs a nebulizer machine    CAT COPD Symptom &  Quality of Life Score (GSK trademark) 0 is no burden. 5 is highest burden 03/26/2013   Never Cough -> Cough all the time 4  No phlegm in chest -> Chest is full of phlegm 4  No chest tightness -> Chest feels very tight 3  No dyspnea for 1 flight stairs/hill -> Very dyspneic for 1 flight of stairs 4  No limitations for ADL at home -> Very limited with ADL at home 4  Confident leaving home -> Not at all confident leaving home 2  Sleep soundly -> Do not sleep soundly because of lung condition 4  Lots of Energy -> No energy at all 4  TOTAL Score (max 40)  29       Review of Systems  Constitutional: Negative for fever and unexpected weight change.  HENT: Negative for ear pain, nosebleeds, congestion, sore throat, rhinorrhea, sneezing, trouble swallowing, dental problem, postnasal drip and sinus pressure.   Eyes: Negative for redness and itching.  Respiratory: Positive for cough, chest tightness and shortness of breath. Negative for wheezing.   Cardiovascular: Negative for palpitations and leg swelling.  Gastrointestinal: Negative for nausea and vomiting.  Genitourinary: Negative for dysuria.  Musculoskeletal: Negative for joint swelling.  Skin: Negative for rash.  Neurological: Negative for headaches.  Hematological: Does not bruise/bleed easily.  Psychiatric/Behavioral: Negative for dysphoric mood. The patient is not nervous/anxious.        Objective:   Physical Exam  Nursing note and vitals reviewed. Constitutional: He is oriented to person, place, and time. He appears well-developed and well-nourished. No distress.  Body mass index is 32.09 kg/(m^2).   HENT:  Head: Normocephalic and atraumatic.  Right Ear: External ear normal.  Left Ear: External ear normal.  Mouth/Throat: Oropharynx is clear and moist. No oropharyngeal exudate.  mallampatti class 4. Says needs antibiotiucs in liquid form  Eyes: Conjunctivae and EOM are normal. Pupils are equal, round, and reactive to light.  Right eye exhibits no discharge. Left eye exhibits no discharge. No scleral icterus.  Neck: Normal range of motion. Neck supple. No JVD present. No tracheal deviation present. No thyromegaly present.  Cardiovascular: Normal rate, regular rhythm and intact distal pulses.  Exam reveals no gallop and no friction rub.   No murmur heard. Pulmonary/Chest: Effort normal. No respiratory distress. He has wheezes. He has no rales. He exhibits no tenderness.  Bilateral wheeze  Abdominal: Soft. Bowel sounds are normal. He exhibits no distension and no mass. There is no tenderness. There is no rebound and no guarding.  Musculoskeletal: Normal range of motion. He exhibits no edema and no tenderness.  Lymphadenopathy:    He has no cervical adenopathy.  Neurological: He is alert and oriented to person, place, and time. He has normal reflexes. No cranial nerve deficit. Coordination normal.  Skin: Skin is warm and dry. No rash noted. He is not diaphoretic. No erythema. No pallor.  Psychiatric: He has a normal  mood and affect. His behavior is normal. Judgment and thought content normal.   Filed Vitals:   03/26/13 0947  BP: 160/90  Pulse: 79  Temp: 97.8 F (36.6 C)  TempSrc: Oral  Height: 5\' 8"  (1.727 m)  Weight: 211 lb (95.709 kg)  SpO2: 94%

## 2013-03-26 NOTE — Telephone Encounter (Signed)
Per Victorino Dike this was to be sent to the pt's pharmacy. Mandie is aware that this has been sent in already per pt's request.

## 2013-03-26 NOTE — Assessment & Plan Note (Signed)
 #  COPD - Gold stage 2  - stop combivent - USE ANORO  1 puff once daily, learn technique - Use albuterol mdoii 2 puff as needed for emergency or Use albuterol neb once daily as needed  - will get you new neb machine - FLu shot after a week at a local pharmacy or PCP MABE,DAVID, NPoffice - refer you again to rehab - alpha 1 check at followup  #FOllowup 3 months or sooner if needed COPD CAT score at followup

## 2013-03-26 NOTE — Patient Instructions (Addendum)
#  Smoking  E cig is like smoking conventional Cig but is a stepping stone towards ultimately quitting  Your goal is to quit completely  #COPD Flare up - Take doxycycline 100mg  po twice daily x 5 days; take after meals and avoid sunlight  - ask pharmacy for liquid form - Take prednisone 40 mg daily x 2 days, then 20mg  daily x 2 days, then 10mg  daily x 2 days, then 5mg  daily x 2 days and stop  #COPD - Gold stage 2  - stop combivent - USE ANORO  1 puff once daily, learn technique - Use albuterol mdoii 2 puff as needed for emergency or Use albuterol neb once daily as needed  - will get you new neb machine - FLu shot after a week at a local pharmacy or PCP MABE,DAVID, NPoffice - refer you again to rehab - alpha 1 check at followup  #FOllowup 3 months or sooner if needed COPD CAT score at followup

## 2013-03-26 NOTE — Assessment & Plan Note (Signed)
I counseled him on the dangers of passive smoking and smoking electronic cigarettes. Up until now he was unaware of the dangers. He is going to get himself a nicotine patch and quit altogether.  3 minutes face-to-face counseling about smoking

## 2013-03-30 ENCOUNTER — Telehealth: Payer: Self-pay | Admitting: Internal Medicine

## 2013-03-30 DIAGNOSIS — J449 Chronic obstructive pulmonary disease, unspecified: Secondary | ICD-10-CM

## 2013-03-30 DIAGNOSIS — J9601 Acute respiratory failure with hypoxia: Secondary | ICD-10-CM

## 2013-03-30 NOTE — Telephone Encounter (Signed)
Called and spoke with pt and he stated that he gets his nebulizer meds from [facility]. Order has been placed for the pt to be scheduled for ONO.  Pt is to call the office back once this has been done to schedule appt with TP for follow up.  Pt is aware. Nothing further is needed.

## 2013-03-30 NOTE — Telephone Encounter (Signed)
oorder ONO ok  Have him see TP for followup afte ONO next few days migh need spiroimetry at OV and reassement

## 2013-03-30 NOTE — Telephone Encounter (Signed)
Spoke with patient-- Patient states he has been having increasing diff breathing, Wed night 9/17 he had to call EMS so they could bring him o2 Patient states he has "never been like this before in his life" Patient is requesting to placed on o2 at night. Dr. Marchelle Gearing please advise if testing can be ordered, thank you!  Last OV 03/26/13

## 2013-03-30 NOTE — Telephone Encounter (Signed)
ATC patient no answer [id number] 

## 2013-04-02 ENCOUNTER — Telehealth: Payer: Self-pay | Admitting: Internal Medicine

## 2013-04-02 NOTE — Telephone Encounter (Signed)
Spoke with the pt He is c/o increased SOB for the past 2 days Taking pred taper and abx as prescribed OV with RA for tomorrow at 11 am ED sooner of worsens

## 2013-04-02 NOTE — Telephone Encounter (Signed)
lmtcb

## 2013-04-02 NOTE — Telephone Encounter (Signed)
I spoke with the pt and he states he has not heard anything about ONO. Order was placed. I spoke with Lucas [name] and ordered had not been processed yet so she has sent this to [facility]. Pt advised. Carron Curie, CMA

## 2013-04-02 NOTE — Telephone Encounter (Signed)
Returning call.Lucas [name] ° °

## 2013-04-03 ENCOUNTER — Inpatient Hospital Stay (HOSPITAL_COMMUNITY)
Admission: AD | Admit: 2013-04-03 | Discharge: 2013-04-06 | DRG: 190 | Disposition: A | Discharge status: Home / Self Care | Payer: Medicare Other | Source: Ambulatory Visit | Attending: Pulmonary Disease | Admitting: Pulmonary Disease

## 2013-04-03 ENCOUNTER — Encounter (HOSPITAL_COMMUNITY): Payer: Self-pay | Admitting: *Deleted

## 2013-04-03 ENCOUNTER — Inpatient Hospital Stay (HOSPITAL_COMMUNITY): Payer: Medicare Other

## 2013-04-03 ENCOUNTER — Ambulatory Visit (INDEPENDENT_AMBULATORY_CARE_PROVIDER_SITE_OTHER): Payer: Medicaid Other | Admitting: Pulmonary Disease

## 2013-04-03 ENCOUNTER — Encounter: Payer: Self-pay | Admitting: Pulmonary Disease

## 2013-04-03 VITALS — BP 162/90 | HR 111 | Temp 98.3°F | Ht 70.5 in | Wt 207.0 lb

## 2013-04-03 DIAGNOSIS — Z79899 Other long term (current) drug therapy: Secondary | ICD-10-CM

## 2013-04-03 DIAGNOSIS — Z85528 Personal history of other malignant neoplasm of kidney: Secondary | ICD-10-CM

## 2013-04-03 DIAGNOSIS — C61 Malignant neoplasm of prostate: Secondary | ICD-10-CM

## 2013-04-03 DIAGNOSIS — E669 Obesity, unspecified: Secondary | ICD-10-CM

## 2013-04-03 DIAGNOSIS — N400 Enlarged prostate without lower urinary tract symptoms: Secondary | ICD-10-CM | POA: Diagnosis present

## 2013-04-03 DIAGNOSIS — N181 Chronic kidney disease, stage 1: Secondary | ICD-10-CM

## 2013-04-03 DIAGNOSIS — Z8546 Personal history of malignant neoplasm of prostate: Secondary | ICD-10-CM

## 2013-04-03 DIAGNOSIS — I5043 Acute on chronic combined systolic (congestive) and diastolic (congestive) heart failure: Secondary | ICD-10-CM | POA: Diagnosis present

## 2013-04-03 DIAGNOSIS — Z87891 Personal history of nicotine dependence: Secondary | ICD-10-CM

## 2013-04-03 DIAGNOSIS — F17200 Nicotine dependence, unspecified, uncomplicated: Secondary | ICD-10-CM

## 2013-04-03 DIAGNOSIS — Z7982 Long term (current) use of aspirin: Secondary | ICD-10-CM

## 2013-04-03 DIAGNOSIS — R300 Dysuria: Secondary | ICD-10-CM

## 2013-04-03 DIAGNOSIS — R0902 Hypoxemia: Secondary | ICD-10-CM | POA: Diagnosis present

## 2013-04-03 DIAGNOSIS — I252 Old myocardial infarction: Secondary | ICD-10-CM

## 2013-04-03 DIAGNOSIS — J449 Chronic obstructive pulmonary disease, unspecified: Secondary | ICD-10-CM

## 2013-04-03 DIAGNOSIS — I209 Angina pectoris, unspecified: Secondary | ICD-10-CM

## 2013-04-03 DIAGNOSIS — R9431 Abnormal electrocardiogram [ECG] [EKG]: Secondary | ICD-10-CM | POA: Diagnosis present

## 2013-04-03 DIAGNOSIS — M129 Arthropathy, unspecified: Secondary | ICD-10-CM | POA: Diagnosis present

## 2013-04-03 DIAGNOSIS — I491 Atrial premature depolarization: Secondary | ICD-10-CM | POA: Diagnosis present

## 2013-04-03 DIAGNOSIS — N390 Urinary tract infection, site not specified: Secondary | ICD-10-CM

## 2013-04-03 DIAGNOSIS — I10 Essential (primary) hypertension: Secondary | ICD-10-CM | POA: Diagnosis present

## 2013-04-03 DIAGNOSIS — J810 Acute pulmonary edema: Secondary | ICD-10-CM

## 2013-04-03 DIAGNOSIS — F4322 Adjustment disorder with anxiety: Secondary | ICD-10-CM

## 2013-04-03 DIAGNOSIS — Z23 Encounter for immunization: Secondary | ICD-10-CM

## 2013-04-03 DIAGNOSIS — J9600 Acute respiratory failure, unspecified whether with hypoxia or hypercapnia: Secondary | ICD-10-CM | POA: Diagnosis present

## 2013-04-03 DIAGNOSIS — I5021 Acute systolic (congestive) heart failure: Secondary | ICD-10-CM

## 2013-04-03 DIAGNOSIS — I2589 Other forms of chronic ischemic heart disease: Secondary | ICD-10-CM | POA: Diagnosis present

## 2013-04-03 DIAGNOSIS — E785 Hyperlipidemia, unspecified: Secondary | ICD-10-CM | POA: Diagnosis present

## 2013-04-03 DIAGNOSIS — I509 Heart failure, unspecified: Secondary | ICD-10-CM | POA: Diagnosis present

## 2013-04-03 DIAGNOSIS — N183 Chronic kidney disease, stage 3 unspecified: Secondary | ICD-10-CM | POA: Diagnosis present

## 2013-04-03 DIAGNOSIS — I4949 Other premature depolarization: Secondary | ICD-10-CM | POA: Diagnosis present

## 2013-04-03 DIAGNOSIS — Z951 Presence of aortocoronary bypass graft: Secondary | ICD-10-CM

## 2013-04-03 DIAGNOSIS — I129 Hypertensive chronic kidney disease with stage 1 through stage 4 chronic kidney disease, or unspecified chronic kidney disease: Secondary | ICD-10-CM | POA: Diagnosis present

## 2013-04-03 DIAGNOSIS — I2510 Atherosclerotic heart disease of native coronary artery without angina pectoris: Secondary | ICD-10-CM | POA: Diagnosis present

## 2013-04-03 DIAGNOSIS — J9601 Acute respiratory failure with hypoxia: Secondary | ICD-10-CM

## 2013-04-03 DIAGNOSIS — J441 Chronic obstructive pulmonary disease with (acute) exacerbation: Principal | ICD-10-CM | POA: Diagnosis present

## 2013-04-03 DIAGNOSIS — D72829 Elevated white blood cell count, unspecified: Secondary | ICD-10-CM | POA: Diagnosis present

## 2013-04-03 LAB — CBC WITH DIFFERENTIAL/PLATELET
Basophils Relative: 0 % (ref 0–1)
Eosinophils Absolute: 0.2 10*3/uL (ref 0.0–0.7)
Hemoglobin: 14 g/dL (ref 13.0–17.0)
Lymphocytes Relative: 16 % (ref 12–46)
Lymphs Abs: 1.1 10*3/uL (ref 0.7–4.0)
MCH: 30.6 pg (ref 26.0–34.0)
MCHC: 33.5 g/dL (ref 30.0–36.0)
MCV: 91.3 fL (ref 78.0–100.0)
Monocytes Relative: 10 % (ref 3–12)
Neutro Abs: 4.9 10*3/uL (ref 1.7–7.7)
Neutrophils Relative %: 72 % (ref 43–77)
Platelets: 211 10*3/uL (ref 150–400)
RBC: 4.58 MIL/uL (ref 4.22–5.81)
WBC: 6.8 10*3/uL (ref 4.0–10.5)

## 2013-04-03 LAB — COMPREHENSIVE METABOLIC PANEL
ALT: 54 U/L — ABNORMAL HIGH (ref 0–53)
AST: 29 U/L (ref 0–37)
Albumin: 3.9 g/dL (ref 3.5–5.2)
CO2: 24 mEq/L (ref 19–32)
Calcium: 9.2 mg/dL (ref 8.4–10.5)
Chloride: 105 mEq/L (ref 96–112)
GFR calc non Af Amer: 63 mL/min — ABNORMAL LOW (ref >90)
Glucose, Bld: 99 mg/dL (ref 70–99)
Potassium: 3.8 mEq/L (ref 3.5–5.1)
Sodium: 140 mEq/L (ref 135–145)
Total Bilirubin: 0.4 mg/dL (ref 0.3–1.2)

## 2013-04-03 LAB — URINALYSIS, ROUTINE W REFLEX MICROSCOPIC
Glucose, UA: NEGATIVE mg/dL
Hgb urine dipstick: NEGATIVE
Leukocytes, UA: NEGATIVE
Protein, ur: 30 mg/dL — AB
Specific Gravity, Urine: 1.022 (ref 1.005–1.030)
pH: 5 (ref 5.0–8.0)

## 2013-04-03 LAB — URINE MICROSCOPIC-ADD ON: Urine-Other: NONE SEEN

## 2013-04-03 LAB — MAGNESIUM: Magnesium: 2 mg/dL (ref 1.5–2.5)

## 2013-04-03 LAB — TROPONIN I: Troponin I: <0.3 ng/mL (ref <0.30)

## 2013-04-03 MED ORDER — ASPIRIN EC 81 MG PO TBEC: 81.0000 mg | DELAYED_RELEASE_TABLET | Freq: Every morning | ORAL | Status: DC

## 2013-04-03 MED ORDER — DOXAZOSIN MESYLATE 8 MG PO TABS
8.0000 mg | ORAL_TABLET | Freq: Every day | ORAL | Status: DC
  Administered 2013-04-03 – 2013-04-05 (×3): 8 mg via ORAL
  Filled 2013-04-03 (×4): qty 1

## 2013-04-03 MED ORDER — ALBUTEROL SULFATE (5 MG/ML) 0.5% IN NEBU
2.5000 mg | INHALATION_SOLUTION | Freq: Four times a day (QID) | RESPIRATORY_TRACT | Status: DC
  Administered 2013-04-03 – 2013-04-04 (×5): 2.5 mg via RESPIRATORY_TRACT
  Filled 2013-04-03 (×4): qty 0.5

## 2013-04-03 MED ORDER — IPRATROPIUM BROMIDE 0.02 % IN SOLN
0.5000 mg | Freq: Four times a day (QID) | RESPIRATORY_TRACT | Status: DC
  Administered 2013-04-03 – 2013-04-04 (×5): 0.5 mg via RESPIRATORY_TRACT
  Filled 2013-04-03 (×4): qty 2.5

## 2013-04-03 MED ORDER — ALPRAZOLAM 1 MG PO TABS
1.0000 mg | ORAL_TABLET | Freq: Two times a day (BID) | ORAL | Status: DC | PRN
  Administered 2013-04-03 – 2013-04-04 (×2): 1 mg via ORAL
  Filled 2013-04-03 (×2): qty 1

## 2013-04-03 MED ORDER — ENOXAPARIN SODIUM 40 MG/0.4ML ~~LOC~~ SOLN
40.0000 mg | SUBCUTANEOUS | Status: DC
Start: 2013-04-03 — End: 2013-04-05
  Administered 2013-04-03 – 2013-04-04 (×2): 40 mg via SUBCUTANEOUS
  Filled 2013-04-03 (×3): qty 0.4

## 2013-04-03 MED ORDER — HYDROCODONE-ACETAMINOPHEN 10-325 MG PO TABS
1.0000 | ORAL_TABLET | Freq: Four times a day (QID) | ORAL | Status: DC | PRN
  Administered 2013-04-04: 1 via ORAL
  Filled 2013-04-03: qty 1

## 2013-04-03 MED ORDER — NEBIVOLOL HCL 10 MG PO TABS: 10.0000 mg | ORAL_TABLET | Freq: Every day | ORAL | Status: DC

## 2013-04-03 MED ORDER — LISINOPRIL 10 MG PO TABS
10.0000 mg | ORAL_TABLET | Freq: Every morning | ORAL | Status: DC
  Administered 2013-04-03 – 2013-04-06 (×4): 10 mg via ORAL
  Filled 2013-04-03 (×4): qty 1

## 2013-04-03 MED ORDER — IPRATROPIUM BROMIDE 0.02 % IN SOLN
RESPIRATORY_TRACT | Status: AC
  Filled 2013-04-03: qty 2.5

## 2013-04-03 MED ORDER — INFLUENZA VAC SPLIT QUAD 0.5 ML IM SUSP
0.5000 mL | INTRAMUSCULAR | Status: AC
  Administered 2013-04-04: 0.5 mL via INTRAMUSCULAR
  Filled 2013-04-03 (×2): qty 0.5

## 2013-04-03 MED ORDER — SODIUM CHLORIDE 0.9 % IV SOLN: 250.0000 mL | INTRAVENOUS | Status: DC | PRN

## 2013-04-03 MED ORDER — ATORVASTATIN CALCIUM 40 MG PO TABS: 40.0000 mg | ORAL_TABLET | Freq: Every day | ORAL | Status: DC

## 2013-04-03 MED ORDER — FUROSEMIDE 10 MG/ML IJ SOLN
40.0000 mg | Freq: Once | INTRAMUSCULAR | Status: AC
  Administered 2013-04-03: 40 mg via INTRAVENOUS
  Filled 2013-04-03: qty 4

## 2013-04-03 MED ORDER — ALBUTEROL SULFATE (5 MG/ML) 0.5% IN NEBU
INHALATION_SOLUTION | RESPIRATORY_TRACT | Status: AC
  Filled 2013-04-03: qty 0.5

## 2013-04-03 MED ORDER — METHYLPREDNISOLONE SODIUM SUCC 40 MG IJ SOLR
40.0000 mg | Freq: Four times a day (QID) | INTRAMUSCULAR | Status: DC
  Administered 2013-04-03 – 2013-04-04 (×5): 40 mg via INTRAVENOUS
  Filled 2013-04-03 (×8): qty 1

## 2013-04-03 NOTE — Progress Notes (Signed)
  Subjective:    Patient ID: Lucas [name], male    DOB: January 07, 1946, 67 y.o.   MRN: [id number]  HPI  67 yo smoker with complex PMH significant for COPD, CAD s/p CABG followed by [facility] (nishan)  presents to the clinic for follow up on COPD.   Marland Kitchen  Spirometry 04/18/12 06/02/12  FEV1 1.64L 46% 1.94 55%  FVC 2.78L 60% 3.01 65%  FEV1/FVC 59 64  Cardiac Cath 11/13  ejection fraction at 45%. Echo EF 25%  1. Severe three-vessel coronary artery disease  2. 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  3. Moderate LV dysfunction as above    04/03/2013 ON 9/15 visit >> given prednisone taper  & doxy, started on Anoro Severe SOB with coughing with production of green mucus. Reports chest tightness/pressure. Onset was 1 week ago. On chantix x 5 d - not tolerating well 4 pillow orthopnea - x 4 ds, feels like jhe is smothering, really scared, call EMS 4 nights ago, satn was 92%, wonders if oxygen will help  Review of Systems neg for any significant sore throat, dysphagia, itching, sneezing, nasal congestion or excess/ purulent secretions, fever, chills, sweats, unintended wt loss, pleuritic or exertional cp, hempoptysis, orthopnea pnd or change in chronic leg swelling. Also denies presyncope, palpitations, heartburn, abdominal pain, nausea, vomiting, diarrhea or change in bowel or urinary habits, dysuria,hematuria, rash, arthralgias, visual complaints, headache, numbness weakness or ataxia.     Objective:   Physical Exam  Gen. Pleasant, obese, in no distress, normal affect ENT - no lesions, no post nasal drip, class 2-3 airway Neck: No JVD, no thyromegaly, no carotid bruits Lungs: no use of accessory muscles, no dullness to percussion, decreased without rales, BL exp rhonchi  Cardiovascular: Rhythm regular, heart sounds  normal, no murmurs or gallops, no  peripheral edema Abdomen: soft and non-tender, no hepatosplenomegaly, BS normal. Musculoskeletal: No deformities, no cyanosis or clubbing Neuro:  alert, non focal, no tremors       Assessment & Plan:

## 2013-04-03 NOTE — Patient Instructions (Signed)
Admit to [facility] [place] [facility] - CHF vs COPD flare

## 2013-04-03 NOTE — H&P (Signed)
PULMONARY  / CRITICAL CARE MEDICINE  Name: Lucas [name] MRN: [id number] DOB: Jan 20, 1946    ADMISSION DATE:  04/03/2013  PRIMARY SERVICE:  PCCM  CHIEF COMPLAINT:  Short of breath, chest heaviness   HISTORY OF PRESENT ILLNESS:  67 yo smoker with complex PMH significant for COPD, CAD s/p CABG followed by [facility] (nishan) presents to the clinic for follow up on COPD.  Marland Kitchen  Spirometry 04/18/12 06/02/12  FEV1 1.64L 46% 1.94 55%  FVC 2.78L 60% 3.01 65%  FEV1/FVC 59 64  Cardiac Cath 11/13 ejection fraction at 45%. Echo EF 25%  1. Severe three-vessel coronary artery disease  2. 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  3. Moderate LV dysfunction as above   04/03/2013  ON 9/15 visit >> given prednisone taper & doxy, started on Anoro  Severe SOB with coughing with production of green mucus. Reports chest tightness/pressure,non radiating, non exertional  Onset was 1 week ago.  On chantix x 5 d - not tolerating well  4 pillow orthopnea - x 4 ds, has been sleeping in a recliner, feels like he is smothering, really scared, called EMS 4 nights ago, satn was 92%, wonders if oxygen will help Nebs & Anoro not helping   PAST MEDICAL HISTORY :  Past Medical History  Diagnosis Date  . CAD (coronary artery disease)   . Hypertension   . Anxiety   . History of kidney cancer   . ADENOCARCINOMA, PROSTATE   . Adjustment disorder with anxiety   . Hyperlipidemia   . BPH (benign prostatic hypertrophy)   . COPD (chronic obstructive pulmonary disease)   . CAD (coronary artery disease)   . Myocardial infarction   . Shortness of breath   . Arthritis    Past Surgical History  Procedure Laterality Date  . Coronary artery bypass graft      x 5  . Cardiac catheterization    . Nephrectomy      left nephrectomy for ca  . Posterior cervical laminectomy      x 8    limited ROM  and can't lie flat  . Heart stents      x 5  . Robot assisted laparoscopic radical prostatectomy      for prostate cancer  . Cystoscopy with litholapaxy  05/01/2012    Procedure: CYSTOSCOPY WITH LITHOLAPAXY;  Surgeon: Crecencio Mc, MD;  Location: [facility];  Service: Urology;  Laterality: N/A;   Prior to Admission medications   Medication Sig Start Date End Date Taking? Authorizing Provider  albuterol (PROVENTIL HFA;VENTOLIN HFA) 108 (90 BASE) MCG/ACT inhaler Inhale 2 puffs into the lungs every 6 (six) hours as needed. Shortness of breath    Historical Provider, MD  albuterol (PROVENTIL) (2.5 MG/3ML) 0.083% nebulizer solution Take 3 mLs (2.5 mg total) by nebulization daily as needed for wheezing. 03/26/13   Kalman Shan, MD  ALPRAZolam Prudy Feeler) 1 MG tablet Take 1 mg by mouth 2 (two) times daily as needed. anxiety    Historical Provider, MD  aspirin EC 81 MG tablet Take 81 mg by mouth every morning.    Historical Provider, MD  CRESTOR 20 MG tablet Take 1 tablet by mouth Daily. 05/24/12   Historical Provider, MD  doxazosin (CARDURA) 8 MG tablet Take 8 mg by mouth at bedtime.     Historical Provider, MD  HYDROcodone-acetaminophen (NORCO) 10-325 MG per  tablet Take 1 tablet by mouth every 6 (six) hours as needed. For pain.    Historical Provider, MD  lisinopril (PRINIVIL,ZESTRIL) 10 MG tablet Take 10 mg by mouth every morning. 04/25/12   Hollice Espy, MD  nebivolol (BYSTOLIC) 10 MG tablet Take 10 mg by mouth at bedtime.     Historical Provider, MD  Umeclidinium-Vilanterol (ANORO ELLIPTA) 62.5-25 MCG/INH AEPB Inhale 1 puff into the lungs daily.    Historical Provider, MD   Allergies  Allergen Reactions  . Ativan [Lorazepam] Other (See Comments)    Increases agitation  . Morphine And Related     hallucinations  . Ibuprofen Rash    Tolerates aspirin per patient.      FAMILY HISTORY:  Family History  Problem Relation Age of Onset  . Diabetes Mother   . Coronary artery disease     . Heart disease Mother   . Heart disease Father   . Heart disease Brother   . Diabetes Father    SOCIAL HISTORY:  reports that he quit smoking 5 days ago. His smoking use included Cigarettes. He has a 16.2 pack-year smoking history. He quit smokeless tobacco use about a year ago. He reports that he does not drink alcohol or use illicit drugs.  REVIEW OF SYSTEMS:   Constitutional: Negative for fever, chills, weight loss, malaise/fatigue and diaphoresis.  HENT: Negative for hearing loss, ear pain, nosebleeds, congestion, sore throat, neck pain, tinnitus and ear discharge.   Eyes: Negative for blurred vision, double vision, photophobia, pain, discharge and redness.  Respiratory: Negative for  hemoptysis, sputum production, and stridor.   Cardiovascular: Negative for  palpitations,  claudication, leg swelling and PND.  Gastrointestinal: Negative for heartburn, nausea, vomiting, abdominal pain, diarrhea, constipation, blood in stool and melena.  Genitourinary: Negative for dysuria, urgency, frequency, hematuria and flank pain.  Musculoskeletal: Negative for myalgias, back pain, joint pain and falls.  Skin: Negative for itching and rash.  Neurological: Negative for dizziness, tingling, tremors, sensory change, speech change, focal weakness, seizures, loss of consciousness, weakness and headaches.  Endo/Heme/Allergies: Negative for environmental allergies and polydipsia. Does not bruise/bleed easily.  SUBJECTIVE:   VITAL SIGNS: Temp:  [98.3 F (36.8 C)] 98.3 F (36.8 C) (09/23 1019) Pulse Rate:  [111] 111 (09/23 1019) BP: (162)/(90) 162/90 mmHg (09/23 1019) SpO2:  [94 %] 94 % (09/23 1019) Weight:  [207 lb (93.895 kg)] 207 lb (93.895 kg) (09/23 1019)  PHYSICAL EXAMINATION: Gen. Pleasant, obese, in no distress, normal affect  ENT - no lesions, no post nasal drip, class 2-3 airway  Neck: No JVD, no thyromegaly, no carotid bruits  Lungs: no use of accessory muscles, no dullness to  percussion, decreased without rales, BL exp rhonchi  Cardiovascular: Rhythm regular, heart sounds normal, no murmurs or gallops, no peripheral edema  Abdomen: soft and non-tender, no hepatosplenomegaly, BS normal.  Musculoskeletal: No deformities, no cyanosis or clubbing  Neuro: alert, non focal, no tremors   No results found for this basename: NA, K, CL, CO2, BUN, CREATININE, GLUCOSE,  in the last 168 hours No results found for this basename: HGB, HCT, WBC, PLT,  in the last 168 hours No results found.  ASSESSMENT / PLAN:  Acute exacerbation of COPD - Given orthopnea , unclear if there is a component of acute systolic heart failure as well. He is clearly at risk for arrythmias & will need telemetry monitoring.  Plan - Will check labs incl BNp , troponin CXR & EKG Solumedrol 40 q 6h, albuterol/atrovent  nebs, hold Anoro for now Lasix x 1 , if CHF suspected will ask Cards input. Lovenox for DVT proph  Pulmonary and Critical Care Medicine [facility] Pager: [phone]  04/03/2013, 12:07 PM

## 2013-04-04 ENCOUNTER — Encounter (HOSPITAL_COMMUNITY): Payer: Self-pay | Admitting: Physician Assistant

## 2013-04-04 DIAGNOSIS — J9600 Acute respiratory failure, unspecified whether with hypoxia or hypercapnia: Secondary | ICD-10-CM

## 2013-04-04 DIAGNOSIS — I359 Nonrheumatic aortic valve disorder, unspecified: Secondary | ICD-10-CM

## 2013-04-04 DIAGNOSIS — I509 Heart failure, unspecified: Secondary | ICD-10-CM

## 2013-04-04 DIAGNOSIS — I5043 Acute on chronic combined systolic (congestive) and diastolic (congestive) heart failure: Secondary | ICD-10-CM

## 2013-04-04 LAB — TROPONIN I: Troponin I: <0.3 ng/mL (ref <0.30)

## 2013-04-04 MED ORDER — AZITHROMYCIN 250 MG PO TABS
250.0000 mg | ORAL_TABLET | Freq: Every day | ORAL | Status: DC
  Administered 2013-04-05 – 2013-04-06 (×2): 250 mg via ORAL
  Filled 2013-04-04 (×2): qty 1

## 2013-04-04 MED ORDER — ALBUTEROL SULFATE (5 MG/ML) 0.5% IN NEBU
2.5000 mg | INHALATION_SOLUTION | RESPIRATORY_TRACT | Status: DC | PRN
  Administered 2013-04-04 – 2013-04-05 (×2): 2.5 mg via RESPIRATORY_TRACT
  Filled 2013-04-04: qty 0.5

## 2013-04-04 MED ORDER — NICOTINE 21 MG/24HR TD PT24
21.0000 mg | MEDICATED_PATCH | Freq: Every day | TRANSDERMAL | Status: DC
  Administered 2013-04-04 – 2013-04-06 (×3): 21 mg via TRANSDERMAL
  Filled 2013-04-04 (×3): qty 1

## 2013-04-04 MED ORDER — POTASSIUM CHLORIDE CRYS ER 20 MEQ PO TBCR
20.0000 meq | EXTENDED_RELEASE_TABLET | Freq: Every day | ORAL | Status: DC
  Administered 2013-04-04 – 2013-04-06 (×3): 20 meq via ORAL
  Filled 2013-04-04 (×3): qty 1

## 2013-04-04 MED ORDER — GUAIFENESIN 100 MG/5ML PO SOLN
200.0000 mg | ORAL | Status: DC | PRN
  Administered 2013-04-04: 200 mg via ORAL
  Filled 2013-04-04: qty 10

## 2013-04-04 MED ORDER — ATORVASTATIN CALCIUM 20 MG PO TABS: 20.0000 mg | ORAL_TABLET | Freq: Every day | ORAL | Status: DC

## 2013-04-04 MED ORDER — ATORVASTATIN CALCIUM 40 MG PO TABS
40.0000 mg | ORAL_TABLET | Freq: Every day | ORAL | Status: DC
  Administered 2013-04-04: 40 mg via ORAL
  Filled 2013-04-04 (×3): qty 1

## 2013-04-04 MED ORDER — FUROSEMIDE 40 MG PO TABS
40.0000 mg | ORAL_TABLET | Freq: Once | ORAL | Status: DC
  Filled 2013-04-04: qty 1

## 2013-04-04 MED ORDER — ALBUTEROL SULFATE (5 MG/ML) 0.5% IN NEBU
2.5000 mg | INHALATION_SOLUTION | Freq: Four times a day (QID) | RESPIRATORY_TRACT | Status: DC
  Administered 2013-04-04 – 2013-04-06 (×5): 2.5 mg via RESPIRATORY_TRACT
  Filled 2013-04-04 (×7): qty 0.5

## 2013-04-04 MED ORDER — ASPIRIN EC 81 MG PO TBEC
81.0000 mg | DELAYED_RELEASE_TABLET | Freq: Every day | ORAL | Status: DC
  Administered 2013-04-04 – 2013-04-06 (×3): 81 mg via ORAL
  Filled 2013-04-04 (×3): qty 1

## 2013-04-04 MED ORDER — FUROSEMIDE 10 MG/ML IJ SOLN
40.0000 mg | Freq: Two times a day (BID) | INTRAMUSCULAR | Status: DC
  Administered 2013-04-04 – 2013-04-05 (×3): 40 mg via INTRAVENOUS
  Filled 2013-04-04 (×5): qty 4

## 2013-04-04 MED ORDER — IPRATROPIUM BROMIDE 0.02 % IN SOLN
0.5000 mg | Freq: Four times a day (QID) | RESPIRATORY_TRACT | Status: DC
  Administered 2013-04-04 – 2013-04-06 (×6): 0.5 mg via RESPIRATORY_TRACT
  Filled 2013-04-04 (×7): qty 2.5

## 2013-04-04 MED ORDER — ALPRAZOLAM 1 MG PO TABS
1.0000 mg | ORAL_TABLET | Freq: Three times a day (TID) | ORAL | Status: DC | PRN
  Administered 2013-04-04 – 2013-04-06 (×5): 1 mg via ORAL
  Filled 2013-04-04 (×6): qty 1

## 2013-04-04 MED ORDER — ISOSORBIDE MONONITRATE ER 30 MG PO TB24
30.0000 mg | ORAL_TABLET | Freq: Every day | ORAL | Status: DC
  Administered 2013-04-04 – 2013-04-06 (×3): 30 mg via ORAL
  Filled 2013-04-04 (×3): qty 1

## 2013-04-04 MED ORDER — PREDNISONE 20 MG PO TABS
20.0000 mg | ORAL_TABLET | Freq: Every day | ORAL | Status: DC
  Administered 2013-04-05 – 2013-04-06 (×2): 20 mg via ORAL
  Filled 2013-04-04 (×3): qty 1

## 2013-04-04 MED ORDER — AZITHROMYCIN 500 MG PO TABS
500.0000 mg | ORAL_TABLET | Freq: Every day | ORAL | Status: AC
  Administered 2013-04-04: 15:00:00 500 mg via ORAL
  Filled 2013-04-04: qty 1

## 2013-04-04 NOTE — Care Management Note (Unsigned)
    Page 1 of 1   04/04/2013     11:26:26 AM   CARE MANAGEMENT NOTE 04/04/2013  Patient:  Lucas [name], Lucas [name]   Account Number:  [id number]  Date Initiated:  04/04/2013  Documentation initiated by:  Colleen Can  Subjective/Objective Assessment:   dx COPD exacerbation     Action/Plan:   Pt is from home. CM will follow for d/c needs.   Anticipated DC Date:  04/06/2013   Anticipated DC Plan:  HOME/SELF CARE      DC Planning Services  CM consult      Choice offered to / List presented to:             Status of service:  In process, will continue to follow Medicare Important Message given?   (If response is "NO", the following Medicare IM given date fields will be blank) Date Medicare IM given:   Date Additional Medicare IM given:    Discharge Disposition:    Per UR Regulation:  Reviewed for med. necessity/level of care/duration of stay  If discussed at Long Length of Stay Meetings, dates discussed:    Comments:

## 2013-04-04 NOTE — Progress Notes (Signed)
PULMONARY  / CRITICAL CARE MEDICINE  Name: Lucas [name] MRN: [id number] DOB: 1945/09/19    ADMISSION DATE:  04/03/2013 CONSULTATION DATE:  04/04/2013  REFERRING MD :  Dr. Marchelle Gearing PRIMARY SERVICE: PCCM  CHIEF COMPLAINT:  SOB, Intermittent Chest pain   BRIEF PATIENT DESCRIPTION: 67 y/o male with PMH of COPD, CAD s/p CABG, and Kidney Cancer presents with increased SOB over past several days. Pt has been in contact with office and was seen in clinic 9/23 by Dr. Vassie Loll for exacerbation of COPD.  Admitted for COPD vs CHF exacerbation  SIGNIFICANT EVENTS / STUDIES:  9/23 EKG: PVC/ PAC, old septal infarct, QT prolongation  ANTIBIOTICS: 9/16 Doxycycline >>>9/20  SUBJECTIVE: Alert and oriented, sitting up in bed, appears to be slightly anxious but in NAD.  VITAL SIGNS: Temp:  [97.7 F (36.5 C)-98.4 F (36.9 C)] 97.7 F (36.5 C) (09/24 0443) Pulse Rate:  [63-93] 65 (09/24 0924) Resp:  [20-24] 20 (09/24 0443) BP: (142-175)/(72-101) 142/74 mmHg (09/24 0924) SpO2:  [93 %-99 %] 95 % (09/24 0808) Weight:  [202 lb 6.1 oz (91.8 kg)-204 lb 2.3 oz (92.6 kg)] 202 lb 6.1 oz (91.8 kg) (09/24 0500)  INTAKE / OUTPUT: Intake/Output     09/23 0701 - 09/24 0700 09/24 0701 - 09/25 0700   P.O. 960    Total Intake(mL/kg) 960 (10.5)    Urine (mL/kg/hr) 2350    Total Output 2350     Net -1390            PHYSICAL EXAMINATION: General:  Obese male, talkative, NAD, sitting up in bed Neuro:  A& Ox 3, MAEs HEENT:  NCAT,  Cardiovascular:  RRR, no murmurs, gallops, rubs Lungs: no use of accessory muscles, decreased breath sounds bilaterally, bilateral expiratory wheezing Abdomen:  Soft, non tender, + BS Musculoskeletal:   No deformities, no cyanosis or clubbing Skin:  Warm and dry, no rash   LABS:  CBC Recent Labs     04/03/13  1210  WBC  6.8  HGB  14.0  HCT  41.8  PLT  211   Coag's No results found for this basename: APTT, INR,  in the last 72 hours BMET Recent Labs     04/03/13   1210  NA  140  K  3.8  CL  105  CO2  24  BUN  18  CREATININE  1.17  GLUCOSE  99   Electrolytes Recent Labs     04/03/13  1210  CALCIUM  9.2  MG  2.0  PHOS  3.8   Liver Enzymes Recent Labs     04/03/13  1210  AST  29  ALT  54*  ALKPHOS  73  BILITOT  0.4  ALBUMIN  3.9   Cardiac Enzymes Recent Labs     04/03/13  1210  TROPONINI  <0.30  PROBNP  7888.0*    CXR: Surgical clips present from CABG procedure. Perihilar pulmonary edema. No evidence of consolidation or cardiomegaly.   ASSESSMENT / PLAN:  PULMONARY A: COPD - s/p outpatient doxy (rx'd 9/15, but ? If picked up at 9/19) Cough - with sputum production P:   -Continue O2 supplementation as needed to maintain SpO2 > 90% -solumedrol -Continue Albuterol / Atrovent -smoking cessation -hold further abx for now -Pulmonary hygiene   CARDIOVASCULAR A:  Elevated BNP - diuresed > 1.3 L with lasix CHF - concern for acute exacerbation vs progression of disease CAD - troponin negative MI S/p Medial Sternotomy and CABG (11/13) Hypertension Hyperlipidemia P:  -  Continue Lisinopril -Trend BNP -Repeat CXR  -EKG -Cardiology consult - appreciate input -consider further afterload reduction   RENAL A:   No acute issues P:   -intermittent BMET  GASTROINTESTINAL / GU A:   BPH P:   -Continue cardiac diet  -continue cardura  HEMATOLOGIC A:   No acute issues P:  -Lovenox for DVT prophylaxis  INFECTIOUS A:   No acute Issues P:   -Intermittent CBC -trend wbc / leukocytosis  ENDOCRINE A:   No acute issues P:   -monitor CBGs  NEUROLOGIC A:   Anxiety Hx of smoking P:   -Xanax as needed for anxiety  -smoking cessation, continue chantix if tolerated    Canary Brim, NP-C [facility] Pgr: [phone] or [phone] 04/04/2013, 10:43 AM  Reviewed above and examined.  He has persistent dyspnea, cough with greenish sputum, and wheeze.  He also has intermittent chest pain.   Question is whether from AECOPD versus systolic CHF exacerbation >> likely both.  Will continue systemic steroids, scheduled BD's, and add zithromax.  Cardiology consulted to further assess heart failure as contributor to current symptoms.  Continue diuresis as tolerated.  Coralyn Helling, MD [facility] Pulmonary/Critical Care 04/04/2013, 12:11 PM Pager:  [phone] After 3pm call: [phone]

## 2013-04-04 NOTE — Progress Notes (Addendum)
Called by RN re: chest pain. Pt had chest pain today, initially described at pressure, now aching, was worse, but now is 5/10. He did not tell anyone at first. Per RN, pt does not appear acutely uncomfortable.  ECG - No ST elevation, no new T wave changes  Plan - discussed with Dr. Shirlee Latch. Will add Imdur 30 mg daily. Check troponin tonight and in am. As long as ECG remains stable and enzymes negative, plan medical therapy in the setting of his acute illness.   Have sent message to the office regarding need for stress test and f/u appt.

## 2013-04-04 NOTE — Progress Notes (Signed)
Flutter valve was given to pt. Pt understands how to use.

## 2013-04-04 NOTE — Consult Note (Signed)
CARDIOLOGY CONSULT NOTE   Patient ID: Lucas [name] MRN: [id number] DOB/AGE: 03/28/1946 67 y.o.  Admit date: 04/03/2013  Primary Physician   Alva Garnet., MD Primary Cardiologist   PN Reason for Consultation   CHF  Lucas [name] is a 67 y.o. male with a history of CAD/CHF.  He was last seen by cardiology in 2013.  Cardiac cath October 2013, stable anatomy and medical therapy recommended, results below.  Two-week history of gradually increasing dyspnea on exertion which had progressed to shortness of breath at rest. He is not aware of any weight gain but does not weigh himself daily. He denies lower extremity edema. He has been coughing and wheezing more than usual as well. He describes PND and orthopnea. For the last 3 nights prior to admission he was not able to sleep at all despite sitting up and leaning forward, propped up on pillows.   At the time his dyspnea on exertion began increasing, he also noted episodes of chest tightness. It would reach a 5/10. It was consistent with exertion. He was able to relieve it by rest. The chest tightness also became worse over the last 2 weeks. He did not think he was a heart attack because the pain did not go up into his neck but it reminded him of his previous angina. He did not take any medications for it.   He has had some sharp pains, they are severe but very brief, lasting only a second or 2. He has gotten them when first getting the nebulizer. They have been in the left chest as well as the right flank region. He does not think they are his heart. He has had this type of pain when coughing as well.  Since being in the hospital, his breathing has improved but he feels agitated and very jumpy because of the steroids. He received Lasix 40 mg IV x 1 last p.m., and has urinated great deal.   Past Medical History  Diagnosis Date  . CAD (coronary artery disease)   . Hypertension   . Anxiety   . History of kidney cancer   .  ADENOCARCINOMA, PROSTATE   . Adjustment disorder with anxiety   . Hyperlipidemia   . BPH (benign prostatic hypertrophy)   . COPD (chronic obstructive pulmonary disease)   . CAD (coronary artery disease)   . Myocardial infarction   . Shortness of breath   . Arthritis      Past Surgical History  Procedure Laterality Date  . Coronary artery bypass graft      x 5  . Cardiac catheterization    . Nephrectomy      left nephrectomy for ca  . Posterior cervical laminectomy      x 8   limited ROM  and can't lie flat  . Heart stents      x 5  . Robot assisted laparoscopic radical prostatectomy      for prostate cancer  . Cystoscopy with litholapaxy  05/01/2012    Procedure: CYSTOSCOPY WITH LITHOLAPAXY;  Surgeon: Crecencio Mc, MD;  Location: [facility];  Service: Urology;  Laterality: N/A;   Allergies  Allergen Reactions  . Ativan [Lorazepam] Other (See Comments)    Increases agitation, tolerates xanax  . Morphine And Related     Hallucinations, too sedated when given with ativan  . Ibuprofen Rash    Tolerates aspirin per patient.     I have reviewed the patient's current medications . albuterol  2.5 mg  Nebulization Q6H WA  . azithromycin  500 mg Oral Daily   Followed by  . [START ON 04/05/2013] azithromycin  250 mg Oral Daily  . doxazosin  8 mg Oral QHS  . enoxaparin (LOVENOX) injection  40 mg Subcutaneous Q24H  . furosemide  40 mg Oral Once  . ipratropium  0.5 mg Nebulization Q6H WA  . lisinopril  10 mg Oral q morning - 10a  . nicotine  21 mg Transdermal Daily  . [START ON 04/05/2013] predniSONE  20 mg Oral Q breakfast     sodium chloride, albuterol, ALPRAZolam, guaiFENesin, HYDROcodone-acetaminophen  Prior to Admission medications   Medication Sig Start Date End Date Taking? Authorizing Provider  albuterol (PROVENTIL HFA;VENTOLIN HFA) 108 (90 BASE) MCG/ACT inhaler Inhale 2 puffs into the lungs every 6 (six) hours as needed. Shortness of breath   Yes Historical Provider, MD    albuterol (PROVENTIL) (2.5 MG/3ML) 0.083% nebulizer solution Take 3 mLs (2.5 mg total) by nebulization daily as needed for wheezing. 03/26/13  Yes Kalman Shan, MD  ALPRAZolam Prudy Feeler) 1 MG tablet Take 1 mg by mouth 3 (three) times daily.   Yes Historical Provider, MD  Umeclidinium-Vilanterol (ANORO ELLIPTA) 62.5-25 MCG/INH AEPB Inhale 1 puff into the lungs daily.   Yes Historical Provider, MD  doxazosin (CARDURA) 8 MG tablet Take 8 mg by mouth at bedtime.     Historical Provider, MD  HYDROcodone-acetaminophen (NORCO) 10-325 MG per tablet Take 1 tablet by mouth every 6 (six) hours as needed. For pain.    Historical Provider, MD  lisinopril (PRINIVIL,ZESTRIL) 10 MG tablet Take 10 mg by mouth every morning. 04/25/12   Hollice Espy, MD     History   Social History  . Marital Status: Widowed    Spouse Name: N/A    Number of Children: N/A  . Years of Education: N/A   Occupational History  . disabled    Social History Main Topics  . Smoking status: Former Smoker -- 0.30 packs/day for 54 years    Types: Cigarettes    Quit date: 03/29/2013  . Smokeless tobacco: Former Neurosurgeon    Quit date: 04/19/2012     Comment: Using e-cig now  . Alcohol Use: No  . Drug Use: No  . Sexual Activity: Not on file   Other Topics Concern  . Not on file   Social History Narrative    He lives in [place] by himself.  He is a widower.    He continues to smoke about 4 or 5  cigarettes a day but has a greater than 100 pack-year history having  previously smoked up to 3-4 packs a day over the span about 48 years.     He denies alcohol or drug use.  He is retired secondary to disability     since the 70s.     Family History  Problem Relation Age of Onset  . Diabetes Mother   . Coronary artery disease    . Heart disease Mother   . Heart disease Father   . Heart disease Brother   . Diabetes Father      ROS:  Full 14 point review of systems complete and found to be negative unless listed  above.  Physical Exam: Blood pressure 142/74, pulse 65, temperature 97.7 F (36.5 C), temperature source Oral, resp. rate 20, height 5\' 10"  (1.778 m), weight 202 lb 6.1 oz (91.8 kg), SpO2 95.00%.  General: Well developed, well nourished, elderly male in moderate respiratory distress (oxygen was  off) Head: Eyes PERRLA, No xanthomas.   Normocephalic and atraumatic, oropharynx without edema or exudate. Dentition: poor Lungs: Bibasilar crackles, left greater than right with some expiratory wheezes as well, he uses accessory muscles Heart: Heart slightly irregular rate and rhythm with S1, S2; soft systolic murmur. pulses are 2+ all 4 extrem.   Neck: No carotid bruits. No lymphadenopathy.  JVP difficult, does not appear markedly elevated. Abdomen: Bowel sounds present, abdomen soft and non-tender without masses or hernias noted. Msk:  No spine or cva tenderness. No weakness, no joint deformities or effusions. Extremities: No clubbing or cyanosis. No edema.  Neuro: Alert and oriented X 3. No focal deficits noted. Psych:  Good affect, responds appropriately Skin: No rashes or lesions noted. Chronic changes to the skin of both lower extremities noted  Labs:   Lab Results  Component Value Date   WBC 6.8 04/03/2013   HGB 14.0 04/03/2013   HCT 41.8 04/03/2013   MCV 91.3 04/03/2013   PLT 211 04/03/2013     Recent Labs Lab 04/03/13 1210  NA 140  K 3.8  CL 105  CO2 24  BUN 18  CREATININE 1.17  CALCIUM 9.2  PROT 7.4  BILITOT 0.4  ALKPHOS 73  ALT 54*  AST 29  GLUCOSE 99  ALBUMIN 3.9   Magnesium  Date Value Range Status  04/03/2013 2.0  1.5 - 2.5 mg/dL Final    Recent Labs  40/98/11 1210  TROPONINI <0.30   Pro B Natriuretic peptide (BNP)  Date/Time Value Range Status  04/03/2013 12:10 PM 7888.0* 0 - 125 pg/mL Final  04/23/2012  4:37 AM 1636.0* 0 - 125 pg/mL Final   Echo: 04/18/2012 Study Conclusions - Procedure narrative: Transthoracic echocardiography. Image quality was  adequate. The study was technically difficult. - Left ventricle: The cavity size was mildly dilated. Wall thickness was increased in a pattern of moderate LVH. Systolic function was severely reduced. The estimated ejection fraction was in the range of 25% to 30%. Diffuse hypokinesis. Features are consistent with a pseudonormal left ventricular filling pattern, with concomitant abnormal relaxation and increased filling pressure (grade 2 diastolic dysfunction). - Aortic valve: Trivial regurgitation. - Left atrium: The atrium was mildly dilated.  ECG: 04/03/2013 Sinus rhythm, PVCs Vent. rate 89 BPM PR interval 162 ms QRS duration 102 ms QT/QTc 418/508 ms P-R-T axes 52 46 80  Cardiac cath: 04/21/2012 Right heart cath RA 6  RV 27/4  PA 26/13 mean 19  PCWP 8  LV 99/16  AO 108/62 mean 81  Oxygen saturations:  PA 63  AO 97  Cardiac Output (Fick) 4.1  Cardiac Index (Fick) 2.0  Left heart cath Left mainstem: Patent with moderate diffuse disease.  Left anterior descending (LAD): Total proximal occlusion, heavy calcification.  Left circumflex (LCx): Severe diffuse disease with severe stenoses of the first and second obtuse marginal branches at the ostia. Subtotal occlusion of the mid left circumflex.  Right coronary artery (RCA): Total occlusion of the proximal vessel  SVG - OM3 widely patent, supplies collateral to OM1/2 and RCA branches  SVG - diag total occlusion  SVG - RCA total occlusion  SVG OM4 total occlusion  LIMA - LAD widely patent, supplies collateral to PDA  Left ventriculography: Left ventricular systolic function is mildly reduced. Assessment is somewhat complicated by PVC beats, but the inferior wall appears hypokinetic. The other segments appear normal. I would estimate the left ventricular ejection fraction at 45%.  Final Conclusions:  1. Severe three-vessel coronary artery disease  2. Status post aorta coronary bypass surgery with continued patency of the  saphenous vein graft to OM 3 and LIMA to LAD, known chronic occlusion of all other vein grafts.  3. Moderate LV dysfunction as above  4. Normal intracardiac hemodynamics  Recommendations: Continued medical therapy. His coronary anatomy is stable from his prior cardiac catheterization.   Radiology:  Dg Chest 2 View 04/03/2013   CLINICAL DATA:  Shortness of breath. CHF versus COPD flare. History of smoking.  EXAM: CHEST  2 VIEW  COMPARISON:  03/19/2013  FINDINGS: The patient has had median sternotomy and CABG. The heart is enlarged. There perihilar changes consistent with pulmonary edema. Small bilateral pleural effusions are present. Surgical clips overlie the abdomen. The patient has had previous cervical fusion.  IMPRESSION: Congestive heart failure.   Electronically Signed   By: Rosalie Gums M.D.   On: 04/03/2013 14:43    ASSESSMENT AND PLAN:   The patient was seen today by Dr. Shirlee Latch, the patient evaluated and the data reviewed.     Acute on chronic combined systolic and diastolic congestive heart failure, NYHA class 4 - would continue diuresis with Lasix 40 mg IV twice a day and follow volume status closely. Recheck an echocardiogram for left ventricular dysfunction and right heart pressures. Encourage him to follow his weight as an outpatient and will send a message to the office for an outpatient followup appointment to be arranged.    Chest pain - cardiac enzymes are negative and his symptoms improved with treatment of his respiratory issues. Would restart him on aspirin at 81 mg daily and a statin. Add a beta blocker such as bisoprolol once his respiratory status has improved.  Otherwise, per primary M.D. Active Problems:   HYPERTENSION   CORONARY ARTERY DISEASE   Acute exacerbation of chronic obstructive pulmonary disease (COPD)  Signed: Theodore Demark, PA-C 04/04/2013 1:09 PM Beeper [phone]  Co-Sign MD  Patient seen with PA, agree with the above note.  1. Acute on chronic  systolic CHF: Last echo in 10/13 with EF 20-25%, ischemic cardiomyopathy.  CXR concerning for pulmonary edema and BNP high at admission. On exam today, he is wheezing.  He actually does not look markedly volume overloaded, JVP is not particularly high.  - Would hold beta blocker with active wheezing, start bisoprolol (beta-1 selective) when stable.  - Continue lisinopril at current dose. - Lasix 40 mg IV bid for now, follow I/Os and weights.  - Would repeat echo as echo in 10/13 showed a new fall in EF.  He does not have an ICD at this time.  2. Acute exacerbation of COPD: I suspect that this is the main issue here. He is actively wheezing. He continues to smoke.  - Continue azithromycin, nebs, steroids. - Needs to quit smoking.  3. Chest pain: History of CAD s/p CABG and redo.  However, the chest pain seems to be associated with paroxysms of cough and wheezing. Most likely noncardiac chest pain.  Would aim towards outpatient Cardiolite at this point.  - Resume home ASA and statin.  Marca Ancona 04/04/2013 1:26 PM

## 2013-04-04 NOTE — Progress Notes (Signed)
Patient complaining of chest pain 7/10. Patient said, "I can't stand it! It's like a knife in my left shoulder." Zenda Alpers on call for cardiology notified. EKG done. Will continue to monitor patient. Setzer, Don Broach

## 2013-04-04 NOTE — Progress Notes (Signed)
RN went into patient room to empty urinal and patient stated, "I should have told you something 20 minutes ago, but I did not want to bother anyone." RN asked patient if he was having pain and patient said, "Well, I had some really bad pain about 20 minutes ago in my chest, but now its dull."  Patient went on to explain that the pain now was 5/10 and dull. EKG done.  Rhonda Barrett notified. New orders placed. Will continue to closely monitor patient. Setzer, Don Broach

## 2013-04-04 NOTE — Progress Notes (Signed)
  Echocardiogram 2D Echocardiogram has been performed.  Lucas [name] 04/04/2013, 1:58 PM

## 2013-04-05 ENCOUNTER — Telehealth: Payer: Self-pay | Admitting: Internal Medicine

## 2013-04-05 DIAGNOSIS — J449 Chronic obstructive pulmonary disease, unspecified: Secondary | ICD-10-CM

## 2013-04-05 LAB — BASIC METABOLIC PANEL
BUN: 40 mg/dL — ABNORMAL HIGH (ref 6–23)
CO2: 23 mEq/L (ref 19–32)
Calcium: 9 mg/dL (ref 8.4–10.5)
Chloride: 99 mEq/L (ref 96–112)
Creatinine, Ser: 1.46 mg/dL — ABNORMAL HIGH (ref 0.50–1.35)
GFR calc Af Amer: 56 mL/min — ABNORMAL LOW (ref >90)
Sodium: 138 mEq/L (ref 135–145)

## 2013-04-05 LAB — TROPONIN I: Troponin I: <0.3 ng/mL (ref <0.30)

## 2013-04-05 LAB — LIPID PANEL
Total CHOL/HDL Ratio: 5.9 RATIO
VLDL: 26 mg/dL (ref 0–40)

## 2013-04-05 MED ORDER — GUAIFENESIN 100 MG/5ML PO SOLN: 200.0000 mg | ORAL | Status: DC | PRN

## 2013-04-05 MED ORDER — IPRATROPIUM BROMIDE 0.02 % IN SOLN
0.5000 mg | RESPIRATORY_TRACT | Status: DC | PRN
  Administered 2013-04-05: 0.5 mg via RESPIRATORY_TRACT

## 2013-04-05 MED ORDER — ATORVASTATIN CALCIUM 40 MG PO TABS: 40.0000 mg | ORAL_TABLET | Freq: Every day | ORAL | Status: DC

## 2013-04-05 MED ORDER — ISOSORBIDE MONONITRATE ER 30 MG PO TB24: 30.0000 mg | ORAL_TABLET | Freq: Every day | ORAL | Status: DC

## 2013-04-05 MED ORDER — ALPRAZOLAM 1 MG PO TABS: 1.0000 mg | ORAL_TABLET | Freq: Three times a day (TID) | ORAL | Status: DC | PRN

## 2013-04-05 MED ORDER — ASPIRIN 81 MG PO TBEC: 81.0000 mg | DELAYED_RELEASE_TABLET | Freq: Every day | ORAL | Status: DC

## 2013-04-05 NOTE — Progress Notes (Addendum)
Patient oxygen saturation on room air is 83-87%.  Will continue to monitor patient.

## 2013-04-05 NOTE — Progress Notes (Signed)
PULMONARY  / CRITICAL CARE MEDICINE  Name: DELAINE CANTER MRN: [id number] DOB: January 11, 1946    ADMISSION DATE:  04/03/2013 CONSULTATION DATE:  04/04/2013  REFERRING MD :  Dr. Marchelle Gearing PRIMARY SERVICE: PCCM  CHIEF COMPLAINT:  SOB, Intermittent Chest pain   BRIEF PATIENT DESCRIPTION: 67 y/o male with PMH of COPD, CAD s/p CABG, and Kidney Cancer presents with increased SOB over past several days. Pt has been in contact with office and was seen in clinic 9/23 by Dr. Vassie Loll for exacerbation of COPD.  Admitted for COPD vs CHF exacerbation  SIGNIFICANT EVENTS: 9/23 Admit from pulmonary office 9/24 Cardiology consulted  STUDIES:  9/24 Echo >> EF 50 to 55%, mild LVH, mild AS, mild/mod LA dilation  ANTIBIOTICS: 9/16 Doxycycline >>> 9/20 9/24 Zithromax >>>   SUBJECTIVE:  Breathing better.  Chest pain improved.  Still has cough.  Not as much wheeze.  Still winded with activity, but better.  Wants to know if he will need oxygen at home.  VITAL SIGNS: Temp:  [97.2 F (36.2 C)-98.1 F (36.7 C)] 97.2 F (36.2 C) (09/25 0418) Pulse Rate:  [72-85] 85 (09/25 0418) Resp:  [18-20] 18 (09/25 0418) BP: (110-129)/(54-85) 129/58 mmHg (09/25 0418) SpO2:  [91 %-97 %] 91 % (09/25 0824) Weight:  [201 lb 1 oz (91.2 kg)] 201 lb 1 oz (91.2 kg) (09/25 0500) 2 liters [place]  INTAKE / OUTPUT: Intake/Output     09/24 0701 - 09/25 0700 09/25 0701 - 09/26 0700   P.O. 840    Total Intake(mL/kg) 840 (9.2)    Urine (mL/kg/hr) 1500 (0.7) 250 (0.7)   Total Output 1500 250   Net -660 -250          PHYSICAL EXAMINATION: General: No distress Neuro: Normal strength HEENT: No sinus tenderness Cardiovascular: regular, no murmur Lungs: better air movement, cough with deep inspiration, faint wheeze Abdomen:  Soft, non tender, + BS Musculoskeletal:   No deformities, no cyanosis or clubbing Skin:  Warm and dry, no rash   LABS:  CBC Recent Labs     04/03/13  1210  WBC  6.8  HGB  14.0  HCT  41.8  PLT  211    BMET Recent Labs     04/03/13  1210  04/05/13  0520  NA  140  138  K  3.8  3.8  CL  105  99  CO2  24  23  BUN  18  40*  CREATININE  1.17  1.46*  GLUCOSE  99  121*   Electrolytes Recent Labs     04/03/13  1210  04/05/13  0520  CALCIUM  9.2  9.0  MG  2.0   --   PHOS  3.8   --    Liver Enzymes Recent Labs     04/03/13  1210  AST  29  ALT  54*  ALKPHOS  73  BILITOT  0.4  ALBUMIN  3.9   Cardiac Enzymes Recent Labs     04/03/13  1210  04/04/13  2215  04/05/13  0520  TROPONINI  <0.30  <0.30  <0.30  PROBNP  7888.0*   --    --    Imaging Dg Chest 2 View  04/03/2013   CLINICAL DATA:  Shortness of breath. CHF versus COPD flare. History of smoking.  EXAM: CHEST  2 VIEW  COMPARISON:  03/19/2013  FINDINGS: The patient has had median sternotomy and CABG. The heart is enlarged. There perihilar changes consistent with pulmonary edema.  Small bilateral pleural effusions are present. Surgical clips overlie the abdomen. The patient has had previous cervical fusion.  IMPRESSION: Congestive heart failure.   Electronically Signed   By: Rosalie Gums M.D.   On: 04/03/2013 14:43     ASSESSMENT / PLAN:   A: Acute hypoxic respiratory failure 2nd to AECOPD and acute diastolic CHF. Tobacco abuse. P:   -assess for home oxygen -continue scheduled BD's -wean off prednisone as tolerated  -Day 2 zithromax -continue nicotine patch >> was on chantix as outpt  A: Acute diastolic dysfunction. Hyperlipidemia. Hx of CAD. Hx of systolic dysfx >> recent echo shows significant improvement in EF. Atypical chest pain >> likely related to coughing. P: -diuresis per cardiology -continue ASA, imdur, lisinopril, atorvastatin -defer addition of beta blocker for now >> consider adding bisoprolol as outpt when fully recovered from AECOPD  A: Hx of BPH and prostate cancer. P: -monitor renal fx, urine outpt -continue cardura  A: Anxiety. P: -prn xanax  Summary: Slowing improving.  Need  to assess for home oxygen.  F/u renal fx will getting lasix.  If stable, then possible d/c home 9/26.  Coralyn Helling, MD [facility] Pulmonary/Critical Care 04/05/2013, 11:01 AM Pager:  [phone] After 3pm call: [phone]

## 2013-04-05 NOTE — Progress Notes (Signed)
    Subjective:  Feels much better this am. Thinks chest pain was related to coughing spells. Still mild dyspnea but improved.  Objective:  Vital Signs in the last 24 hours: Temp:  [97.2 F (36.2 C)-98.1 F (36.7 C)] 97.2 F (36.2 C) (09/25 0418) Pulse Rate:  [65-85] 85 (09/25 0418) Resp:  [18-20] 18 (09/25 0418) BP: (110-142)/(54-85) 129/58 mmHg (09/25 0418) SpO2:  [95 %-97 %] 96 % (09/25 0418) Weight:  [201 lb 1 oz (91.2 kg)] 201 lb 1 oz (91.2 kg) (09/25 0500)  Intake/Output from previous day: 09/24 0701 - 09/25 0700 In: 840 [P.O.:840] Out: 1500 [Urine:1500]  Physical Exam: Pt is alert and oriented, NAD HEENT: normal Neck: JVP - mildly elevated Lungs: coarse bilaterally without rales CV: RRR with soft SEM at the LSB Abd: soft, NT, Positive BS, no hepatomegaly Ext: no C/C/E, distal pulses intact and equal Skin: warm/dry no rash   Lab Results:  Recent Labs  04/03/13 1210  WBC 6.8  HGB 14.0  PLT 211    Recent Labs  04/03/13 1210 04/05/13 0520  NA 140 138  K 3.8 3.8  CL 105 99  CO2 24 23  GLUCOSE 99 121*  BUN 18 40*  CREATININE 1.17 1.46*    Recent Labs  04/04/13 2215 04/05/13 0520  TROPONINI <0.30 <0.30    Cardiac Studies: 2D Echo: Study Conclusions  - Left ventricle: The cavity size was normal. Wall thickness was increased in a pattern of mild LVH. Systolic function was normal. The estimated ejection fraction was in the range of 50% to 55%. - Aortic valve: There was very mild stenosis. - Mitral valve: Calcified annulus. Mildly thickened leaflets . - Left atrium: The atrium was moderately dilated. - Atrial septum: No defect or patent foramen ovale was identified.  Tele: sinus rhythm with rare PVC's  Assessment/Plan:  1. Acute on chronic diastolic CHF, improved with IV diuresis 2. Acute COPD exacerbation 3. CAD s/p CABG 4. Atypical chest pain  Clinically improved with nebulizer, steroids, and diuresis. Suspect this is a mixed picture  but think diastolic CHF playing a significant role. Cardiac enzymes are negative, and no further ischemic eval indicated at this time. Continue IV diuresis today and plan convert to oral lasix tomorrow. No longer wheezing - will evaluate in the morning and consider addition of bisoprolol if lungs exam stable.   Tonny Bollman, M.D. 04/05/2013, 7:17 AM

## 2013-04-05 NOTE — Progress Notes (Signed)
Met with [name] at bedside to offer [facility] Care Management services. He is familiar with [name] Care Management and states he can use further follow up for disease management/COPD. Consents signed at bedside. He will receive a post hospital discharge call and will be evaluated for monthly home visits. Appreciative of visit. Confirmed best contact information and left [facility] Care Management packet at bedside.  [name], MSN- Ed, RN,BSN-- [facility] [phone]

## 2013-04-05 NOTE — Telephone Encounter (Signed)
Rehab did not accept old spirometry results in the compuyter because they did not say post bronchodilator. Please have patient meet Tammy Wilson or [place] and do spirometry pre-= and post -BD. I will hold the referral sheet till this is done. I have placed order   Dr. Kalman Shan, M.D., [facility].C.P Pulmonary and Critical Care Medicine Staff Physician [facility] Pulmonary and Critical Care Pager: [phone], If no answer or between  15:00h - 7:00h: call [phone]  04/05/2013 4:58 PM

## 2013-04-05 NOTE — Discharge Summary (Signed)
Physician Discharge Summary  Patient ID: DESI ROWE MRN: [id number] DOB/AGE: 1945/07/29 67 y.o.  Admit date: 04/03/2013 Discharge date: 04/06/2013    Discharge Diagnoses:  Acute exacerbation of COPD Acute hypoxic respiratory failure Acute diastolic heart failure, NYHA class 4 Coronary artery disease Stage III CKD   Brief Summary: Lucas [name] is a 67 y.o. y/o male with a PMH of COPD, HTN, CAD, and CHF who was admitted for SOB in the setting of COPD vs CHF exacerbation.   Consults: 09/24 - Cardiology  Lines/tubes: PIV  Microbiology/Sepsis markers: None  Significant Diagnostic Studies:  09/24 - 2D Echo (EF 50% - 55%, mild LVH, mild AS, mild/mod LA dilation)                                                                    Hospital Summary by Discharge Diagnosis 1.  Acute on chronic Diastolic CHF. - CXR in hospital was concerning for pulmonary edema.  A 2D echo was performed which showed an EF of 50-55%.  Cardiology followed pt closely and he was diuresed aggressively.  -home on oxygen - PO Lasix/ 40 mg/d, further dosing to be addressed by cards.  - Imdur 30mg  QD - Lisinopril 10mg  QD -  Consider addition of Bisoprolol as an outpatient when fully recovered from AECOPD.   2.  Acute COPD Exacerbation - Pt was treated with IV steroids and nebulizer breathing treatments to which pt responded well. Oxygen saturations at rest 90%, with ambulation it dropped to 87%. Placed on 2 liters sats remained in mid-90s w/ activity  -oxygen 2 liters/activity and sleep - Prednisone taper x 1 week and BD's. - Treated initially on Doxycycline.   Will be d/c'd on Azithromycin for a total of 10 days antibiotics now day 3/5 azithro - Smoking cessation strongly recommended.  Pt is on Chantix as an outpatient, will resume upon d/c. -will cont nicotine patch   3.  CAD s/p CABG - Aspirin 81mg  QD. - Atorvastatin 40mg  QD  Filed Vitals:   04/05/13 2048 04/06/13 0446 04/06/13 0500  04/06/13 0820  BP: 126/64 127/75    Pulse: 73 73    Temp: 97.7 F (36.5 C) 97.7 F (36.5 C)    TempSrc: Oral Oral    Resp: 20 18    Height:      Weight:   91.808 kg (202 lb 6.4 oz)   SpO2: 95% 94%  94%    PHYSICAL EXAMINATION:  General: No distress  Neuro: Normal strength  HEENT: No sinus tenderness  Cardiovascular: regular, no murmur  Lungs: better air movement, cough with deep inspiration, scattered rhonchi, no wheeze  Abdomen: Soft, non tender, + BS  Musculoskeletal: No deformities, no cyanosis or clubbing  Skin: Warm and dry, no rash   Discharge Labs  BMET  Recent Labs Lab 04/03/13 1210 04/05/13 0520 04/06/13 0332  NA 140 138 139  K 3.8 3.8 3.8  CL 105 99 99  CO2 24 23 27   GLUCOSE 99 121* 125*  BUN 18 40* 40*  CREATININE 1.17 1.46* 1.56*  CALCIUM 9.2 9.0 9.5  MG 2.0  --   --   PHOS 3.8  --   --      CBC   Recent Labs Lab 04/03/13 1210  HGB 14.0  HCT 41.8  WBC 6.8  PLT 211   Anti-Coagulation No results found for this basename: INR,  in the last 168 hours    Discharge Orders   Future Appointments Provider Department Dept Phone   04/12/2013 3:45 PM Kalman Shan, MD [facility] [phone]   Future Orders Complete By Expires   Diet - low sodium heart healthy  As directed    Increase activity slowly  As directed            Follow-up Information   Follow up with [facility], MD On 04/12/2013. (3:45pm )    Specialty:  Pulmonary Disease   Contact information:   [address] [place] [address] [phone]       Follow up with Alva Garnet., MD. Schedule an appointment as soon as possible for a visit in 2 weeks.   Specialty:  Internal Medicine   Contact information:   [address] [place] [address] [phone]       Follow up with Charlton Haws, MD. (Office will call you with appointment )    Specialty:  Cardiology   Contact information:   [address] [place]  [place] [address] [phone]          Medication List    STOP taking these medications       albuterol (2.5 MG/3ML) 0.083% nebulizer solution  Commonly known as:  PROVENTIL  Replaced by:  albuterol (5 MG/ML) 0.5% nebulizer solution     ANORO ELLIPTA 62.5-25 MCG/INH Aepb  Generic drug:  Umeclidinium-Vilanterol      TAKE these medications       albuterol (5 MG/ML) 0.5% nebulizer solution  Commonly known as:  PROVENTIL  Take 0.5 mLs (2.5 mg total) by nebulization 4 (four) times daily.     albuterol (2.5 MG/3ML) 0.083% nebulizer solution  Commonly known as:  PROVENTIL  Take 3 mLs (2.5 mg total) by nebulization every 6 (six) hours as needed for wheezing.     albuterol 108 (90 BASE) MCG/ACT inhaler  Commonly known as:  PROVENTIL HFA;VENTOLIN HFA  Inhale 2 puffs into the lungs every 6 (six) hours as needed. Shortness of breath     ALPRAZolam 1 MG tablet  Commonly known as:  XANAX  Take 1 tablet (1 mg total) by mouth 3 (three) times daily as needed for sleep.     aspirin 81 MG EC tablet  Take 1 tablet (81 mg total) by mouth daily.     atorvastatin 40 MG tablet  Commonly known as:  LIPITOR  Take 1 tablet (40 mg total) by mouth daily at 6 PM.     azithromycin 250 MG tablet  Commonly known as:  ZITHROMAX  Take 1 tablet (250 mg total) by mouth daily.     doxazosin 8 MG tablet  Commonly known as:  CARDURA  Take 8 mg by mouth at bedtime.     furosemide 40 MG tablet  Commonly known as:  LASIX  Take 1 tablet (40 mg total) by mouth daily.     guaiFENesin 100 MG/5ML Soln  Commonly known as:  ROBITUSSIN  Take 10 mLs (200 mg total) by mouth every 4 (four) hours as needed.     HYDROcodone-acetaminophen 10-325 MG per tablet  Commonly known as:  NORCO  Take 1 tablet by mouth every 6 (six) hours as needed. For pain.     ipratropium 0.02 % nebulizer solution  Commonly known as:  ATROVENT  Take  2.5 mLs (0.5 mg total) by nebulization 4 (four) times daily.     isosorbide mononitrate  30 MG 24 hr tablet  Commonly known as:  IMDUR  Take 1 tablet (30 mg total) by mouth daily.     lisinopril 10 MG tablet  Commonly known as:  PRINIVIL,ZESTRIL  Take 10 mg by mouth every morning.     nicotine 21 mg/24hr patch  Commonly known as:  NICODERM CQ - dosed in mg/24 hours  Place 1 patch onto the skin daily.     potassium chloride SA 20 MEQ tablet  Commonly known as:  K-DUR,KLOR-CON  Take 1 tablet (20 mEq total) by mouth daily.     predniSONE 10 MG tablet  Commonly known as:  DELTASONE  Take 2 tabs/d x 2d, then 1 tab/d x 3d and stop          Disposition: 01-Home or Self Care  Discharged Condition: JESPER STIREWALT has met maximum benefit of inpatient care and is medically stable and cleared for discharge.  Patient is pending follow up as above.      Time spent on disposition:  Greater than 35 minutes.   SignedAnders Simmonds, NP 04/06/2013  11:00 AM Pager: [phone] or [phone]  *Care during the described time interval was provided by me and/or other providers on the critical care team. I have reviewed this patient's available data, including medical history, events of note, physical examination and test results as part of my evaluation.     Coralyn Helling, MD [facility] Pulmonary/Critical Care 04/06/2013, 11:18 AM Pager:  [phone] After 3pm call: [phone]

## 2013-04-05 NOTE — Progress Notes (Signed)
Patient had 8 bts of v-tach.  Patient asymptomatic, BP 115/68.  Dr. Vassie Loll notified, will continue to monitor.

## 2013-04-06 LAB — BASIC METABOLIC PANEL
CO2: 27 mEq/L (ref 19–32)
Calcium: 9.5 mg/dL (ref 8.4–10.5)
Creatinine, Ser: 1.56 mg/dL — ABNORMAL HIGH (ref 0.50–1.35)
GFR calc Af Amer: 51 mL/min — ABNORMAL LOW (ref >90)
GFR calc non Af Amer: 44 mL/min — ABNORMAL LOW (ref >90)
Glucose, Bld: 125 mg/dL — ABNORMAL HIGH (ref 70–99)

## 2013-04-06 MED ORDER — ALBUTEROL SULFATE (2.5 MG/3ML) 0.083% IN NEBU: 2.5000 mg | INHALATION_SOLUTION | Freq: Four times a day (QID) | RESPIRATORY_TRACT | Status: DC | PRN

## 2013-04-06 MED ORDER — ALBUTEROL SULFATE (5 MG/ML) 0.5% IN NEBU: 2.5000 mg | INHALATION_SOLUTION | Freq: Four times a day (QID) | RESPIRATORY_TRACT | Status: DC

## 2013-04-06 MED ORDER — NICOTINE 21 MG/24HR TD PT24: 1.0000 | MEDICATED_PATCH | Freq: Every day | TRANSDERMAL | Status: DC

## 2013-04-06 MED ORDER — PREDNISONE 10 MG PO TABS: ORAL_TABLET | ORAL | Status: DC

## 2013-04-06 MED ORDER — IPRATROPIUM BROMIDE 0.02 % IN SOLN: 0.5000 mg | Freq: Four times a day (QID) | RESPIRATORY_TRACT | Status: DC

## 2013-04-06 MED ORDER — POTASSIUM CHLORIDE CRYS ER 20 MEQ PO TBCR: 20.0000 meq | EXTENDED_RELEASE_TABLET | Freq: Every day | ORAL | Status: DC

## 2013-04-06 MED ORDER — FUROSEMIDE 40 MG PO TABS: 40.0000 mg | ORAL_TABLET | Freq: Every day | ORAL | Status: DC

## 2013-04-06 MED ORDER — AZITHROMYCIN 250 MG PO TABS: 250.0000 mg | ORAL_TABLET | Freq: Every day | ORAL | Status: DC

## 2013-04-06 MED ORDER — FUROSEMIDE 40 MG PO TABS
40.0000 mg | ORAL_TABLET | Freq: Every day | ORAL | Status: DC
  Administered 2013-04-06: 40 mg via ORAL
  Filled 2013-04-06: qty 1

## 2013-04-06 NOTE — Progress Notes (Signed)
PULMONARY  / CRITICAL CARE MEDICINE  Name: Lucas [name] MRN: [id number] DOB: 03-Oct-1945    ADMISSION DATE:  04/03/2013 CONSULTATION DATE:  04/04/2013  REFERRING MD :  Dr. Marchelle Gearing PRIMARY SERVICE: PCCM  CHIEF COMPLAINT:  SOB, Intermittent Chest pain   BRIEF PATIENT DESCRIPTION: 67 y/o male with PMH of COPD, CAD s/p CABG, and Kidney Cancer presents with increased SOB over past several days. Pt has been in contact with office and was seen in clinic 9/23 by Dr. Vassie Loll for exacerbation of COPD.  Admitted for COPD vs CHF exacerbation  SIGNIFICANT EVENTS: 9/23 Admit from pulmonary office 9/24 Cardiology consulted 9/25 SpO2 87% on room air with ambulation  STUDIES:  9/24 Echo >> EF 50 to 55%, mild LVH, mild AS, mild/mod LA dilation  ANTIBIOTICS: 9/16 Doxycycline >>> 9/20 9/24 Zithromax >>>   SUBJECTIVE:  Anxious to go home.  Breathing better.  Still has cough, but easier to bring up sputum.  Feels nebulizer medications work better than his inhalers.  VITAL SIGNS: Temp:  [97.4 F (36.3 C)-97.7 F (36.5 C)] 97.7 F (36.5 C) (09/26 0446) Pulse Rate:  [63-73] 73 (09/26 0446) Resp:  [18-20] 18 (09/26 0446) BP: (107-127)/(54-75) 127/75 mmHg (09/26 0446) SpO2:  [93 %-95 %] 94 % (09/26 0820) Weight:  [202 lb 6.4 oz (91.808 kg)] 202 lb 6.4 oz (91.808 kg) (09/26 0500) 2 liters [place]  INTAKE / OUTPUT: Intake/Output     09/25 0701 - 09/26 0700 09/26 0701 - 09/27 0700   P.O. 960 360   Total Intake(mL/kg) 960 (10.5) 360 (3.9)   Urine (mL/kg/hr) 2950 (1.3) 300 (1.1)   Total Output 2950 300   Net -1990 +60          PHYSICAL EXAMINATION: General: No distress Neuro: Normal strength HEENT: No sinus tenderness Cardiovascular: regular, no murmur Lungs: better air movement, cough with deep inspiration, scattered rhonchi, no wheeze Abdomen:  Soft, non tender, + BS Musculoskeletal:   No deformities, no cyanosis or clubbing Skin:  Warm and dry, no rash   LABS:  CBC Recent Labs   04/03/13  1210  WBC  6.8  HGB  14.0  HCT  41.8  PLT  211   BMET Recent Labs     04/03/13  1210  04/05/13  0520  04/06/13  0332  NA  140  138  139  K  3.8  3.8  3.8  CL  105  99  99  CO2  24  23  27   BUN  18  40*  40*  CREATININE  1.17  1.46*  1.56*  GLUCOSE  99  121*  125*   Electrolytes Recent Labs     04/03/13  1210  04/05/13  0520  04/06/13  0332  CALCIUM  9.2  9.0  9.5  MG  2.0   --    --   PHOS  3.8   --    --    Liver Enzymes Recent Labs     04/03/13  1210  AST  29  ALT  54*  ALKPHOS  73  BILITOT  0.4  ALBUMIN  3.9   Cardiac Enzymes Recent Labs     04/03/13  1210  04/04/13  2215  04/05/13  0520  TROPONINI  <0.30  <0.30  <0.30  PROBNP  7888.0*   --    --    Imaging No results found.   ASSESSMENT / PLAN:   A: Acute hypoxic respiratory failure 2nd to AECOPD and  acute diastolic CHF. Tobacco abuse. P:   -will need 2 liters oxygen with exertion and sleep >> will need portable oxygen set up -continue scheduled BD's >> will arrange for home nebulizer -wean off prednisone as tolerated over next one week -Day 3/5 zithromax -continue nicotine 21 mg patch, and wean off as tolerated >> was on chantix as outpt  A: Acute diastolic dysfunction. Hyperlipidemia. Hx of CAD. Hx of systolic dysfx >> recent echo shows significant improvement in EF. Atypical chest pain >> likely related to coughing. P: -discharge on lasix 40 mg daily >> further adjustments as outpt with cardiology -continue ASA, imdur, lisinopril, atorvastatin -defer addition of beta blocker for now >> consider adding bisoprolol as outpt when fully recovered from AECOPD  A: Hx of BPH and prostate cancer. P: -f/u BMET in one week with cardiology as outpt -continue cardura  A: Anxiety. P: -prn xanax  Summary: He is ready for D/C home.  Need to arrange for home oxygen, and home nebulizer.  He will need f/u in pulmonary office in one week with either Dr. Marchelle Gearing or [facility].   Continue scheduled atrovent/albuterol nebulizer as outpt, and hold anoro until re-assessed as outpt.  He will need follow up in one week with Dr. Excell Seltzer with repeat BMET.  Coralyn Helling, MD [facility] Pulmonary/Critical Care 04/06/2013, 10:03 AM Pager:  [phone] After 3pm call: [phone]

## 2013-04-06 NOTE — Progress Notes (Signed)
A call from [facility] states pt want O2 from them because he is active with [facility]. A call made to pt concerning O2, pt states yes, he want [facility]. [facility] called and orders faxed to [facility] [phone].

## 2013-04-06 NOTE — Progress Notes (Signed)
Home O2 from [facility].

## 2013-04-06 NOTE — Telephone Encounter (Signed)
Spoke with the pt and he stated he was busy and will call me back in 1 hour. Will await call back. Carron Curie, CMA

## 2013-04-06 NOTE — Telephone Encounter (Signed)
Pt unable to come on open date in our office for Pre/Post BD PFT. Spoke with Misty Stanley and scheduled pt for Sept 29 at 9am. Pt aware and was instructed to not use any inhalers that morning.   Will send to MR nurse to make aware this has been scheduled

## 2013-04-06 NOTE — Telephone Encounter (Signed)
Received a call from [place] [facility] and she states they need to change test to 04/10/13 at 9am instead of 9/29. I called the pt and he is ok with this. Carron Curie, CMA

## 2013-04-06 NOTE — Progress Notes (Signed)
    Subjective:  Feels great this morning. No CP or dyspnea.   Objective:  Vital Signs in the last 24 hours: Temp:  [97.4 F (36.3 C)-97.7 F (36.5 C)] 97.7 F (36.5 C) (09/26 0446) Pulse Rate:  [63-73] 73 (09/26 0446) Resp:  [18-20] 18 (09/26 0446) BP: (107-127)/(54-75) 127/75 mmHg (09/26 0446) SpO2:  [91 %-95 %] 94 % (09/26 0446) Weight:  [91.808 kg (202 lb 6.4 oz)] 91.808 kg (202 lb 6.4 oz) (09/26 0500)  Intake/Output from previous day: 09/25 0701 - 09/26 0700 In: 960 [P.O.:960] Out: 2950 [Urine:2950]  Physical Exam: Pt is alert and oriented, NAD HEENT: normal Neck: JVP - normal Lungs: decreased throughout CV: RRR without murmur or gallop Abd: soft, NT, Positive BS, no hepatomegaly Ext: no C/C/E, distal pulses intact and equal Skin: warm/dry no rash   Lab Results:  Recent Labs  04/03/13 1210  WBC 6.8  HGB 14.0  PLT 211    Recent Labs  04/05/13 0520 04/06/13 0332  NA 138 139  K 3.8 3.8  CL 99 99  CO2 23 27  GLUCOSE 121* 125*  BUN 40* 40*  CREATININE 1.46* 1.56*    Recent Labs  04/04/13 2215 04/05/13 0520  TROPONINI <0.30 <0.30    Cardiac Studies: No new studies  Tele: Sinus rhythm, PVC's  Assessment/Plan:  1. Acute on chronic diastolic CHF, improved with IV diuresis  2. Acute COPD exacerbation  3. CAD s/p CABG  4. Atypical chest pain - resolved  Clinically improved with IV diuresis and tx of COPD exacerbation. Will hold on beta-1 selective beta-blocker until he is fully recovered from COPD exacerbation - plan start at outpatient follow-up. BUN/Cr are a little elevated after diuresis, but may need to tolerate this in order to keep him out of CHF. Would discharge on lasix 40 mg daily. Will arrange outpatient follow-up in one week with a BMET at that time. Otherwise continue same meds.  Tonny Bollman, M.D. 04/06/2013, 7:08 AM

## 2013-04-09 ENCOUNTER — Encounter (HOSPITAL_COMMUNITY): Payer: Medicare Other

## 2013-04-10 ENCOUNTER — Ambulatory Visit (HOSPITAL_COMMUNITY)
Admission: RE | Admit: 2013-04-10 | Discharge: 2013-04-10 | Disposition: A | Discharge status: Home / Self Care | Payer: Medicare Other | Source: Ambulatory Visit | Attending: Internal Medicine | Admitting: Internal Medicine

## 2013-04-10 DIAGNOSIS — J449 Chronic obstructive pulmonary disease, unspecified: Secondary | ICD-10-CM

## 2013-04-10 DIAGNOSIS — F17200 Nicotine dependence, unspecified, uncomplicated: Secondary | ICD-10-CM | POA: Insufficient documentation

## 2013-04-10 DIAGNOSIS — J4489 Other specified chronic obstructive pulmonary disease: Secondary | ICD-10-CM | POA: Insufficient documentation

## 2013-04-10 LAB — PULMONARY FUNCTION TEST

## 2013-04-10 MED ORDER — ALBUTEROL SULFATE (5 MG/ML) 0.5% IN NEBU
2.5000 mg | INHALATION_SOLUTION | Freq: Once | RESPIRATORY_TRACT | Status: AC
  Administered 2013-04-10: 2.5 mg via RESPIRATORY_TRACT

## 2013-04-11 ENCOUNTER — Other Ambulatory Visit: Payer: Self-pay | Admitting: *Deleted

## 2013-04-11 DIAGNOSIS — R079 Chest pain, unspecified: Secondary | ICD-10-CM

## 2013-04-12 ENCOUNTER — Inpatient Hospital Stay: Payer: PRIVATE HEALTH INSURANCE | Admitting: Internal Medicine

## 2013-04-16 ENCOUNTER — Other Ambulatory Visit: Payer: Self-pay | Admitting: *Deleted

## 2013-04-16 ENCOUNTER — Other Ambulatory Visit: Payer: PRIVATE HEALTH INSURANCE

## 2013-04-16 ENCOUNTER — Ambulatory Visit (INDEPENDENT_AMBULATORY_CARE_PROVIDER_SITE_OTHER): Payer: PRIVATE HEALTH INSURANCE | Admitting: Cardiovascular Disease

## 2013-04-16 ENCOUNTER — Encounter: Payer: Self-pay | Admitting: Cardiovascular Disease

## 2013-04-16 VITALS — BP 135/68 | HR 80 | Ht 70.0 in | Wt 212.0 lb

## 2013-04-16 DIAGNOSIS — Z79899 Other long term (current) drug therapy: Secondary | ICD-10-CM

## 2013-04-16 DIAGNOSIS — I2510 Atherosclerotic heart disease of native coronary artery without angina pectoris: Secondary | ICD-10-CM

## 2013-04-16 DIAGNOSIS — I10 Essential (primary) hypertension: Secondary | ICD-10-CM

## 2013-04-16 DIAGNOSIS — F17200 Nicotine dependence, unspecified, uncomplicated: Secondary | ICD-10-CM

## 2013-04-16 DIAGNOSIS — I5043 Acute on chronic combined systolic (congestive) and diastolic (congestive) heart failure: Secondary | ICD-10-CM

## 2013-04-16 DIAGNOSIS — I509 Heart failure, unspecified: Secondary | ICD-10-CM

## 2013-04-16 DIAGNOSIS — J441 Chronic obstructive pulmonary disease with (acute) exacerbation: Secondary | ICD-10-CM

## 2013-04-16 LAB — BASIC METABOLIC PANEL
CO2: 29 mEq/L (ref 19–32)
Calcium: 8.7 mg/dL (ref 8.4–10.5)
Creatinine, Ser: 1.2 mg/dL (ref 0.4–1.5)
GFR: 61.64 mL/min (ref >60.00)
Glucose, Bld: 99 mg/dL (ref 70–99)
Potassium: 3.9 mEq/L (ref 3.5–5.1)
Sodium: 136 mEq/L (ref 135–145)

## 2013-04-16 LAB — BRAIN NATRIURETIC PEPTIDE: Pro B Natriuretic peptide (BNP): 827 pg/mL — ABNORMAL HIGH (ref 0.0–100.0)

## 2013-04-16 MED ORDER — FUROSEMIDE 40 MG PO TABS: 20.0000 mg | ORAL_TABLET | Freq: Every day | ORAL | Status: DC

## 2013-04-16 NOTE — Patient Instructions (Addendum)
Your physician wants you to follow-up in: 3 MONTHS  WITH DR Haywood Filler will receive a reminder letter in the mail two months in advance. If you don't receive a letter, please call our office to schedule the follow-up appointment.  Your physician recommends that you return for lab work in: TODAY  BMET BNP

## 2013-04-16 NOTE — Assessment & Plan Note (Signed)
Euvolemic and improved  F/U BMET and BNP today

## 2013-04-16 NOTE — Progress Notes (Signed)
Patient ID: ROC STREETT, male   DOB: [birth year], 67 y.o.   MRN: [id number] 67 yo recently hospitalized with respitory failure.  Significant COPD on home oxygen and still smoking some.  Rx with steroids and antibiotics  Echo with EF 50-55% diuresed for component of "diatiolic" heart failure  BNP was elevated at 7888  History of CAD stable.  R/O  Cath 04/21/12  Left ventriculography: Left ventricular systolic function is mildly reduced. Assessment is somewhat complicated by PVC beats, but the inferior wall appears hypokinetic. The other segments appear normal. I would estimate the left ventricular ejection fraction at 45%.  Final Conclusions:  1. Severe three-vessel coronary artery disease  2. 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  3. Moderate LV dysfunction as above  4. Normal intracardiac hemodynamics  Has a new car [name] is is trying not to smoke in it   ROS: Denies fever, malais, weight loss, blurry vision, decreased visual acuity, cough, sputum, SOB, hemoptysis, pleuritic pain, palpitaitons, heartburn, abdominal pain, melena, lower extremity edema, claudication, or rash.  All other systems reviewed and negative  General: Affect appropriate Chronically ill white male  HEENT: normal Neck supple with no adenopathy JVP normal no bruits no thyromegaly Lungs mild expitory  wheezing and good diaphragmatic motion Heart:  S1/S2 no murmur, no rub, gallop or click PMI normal Abdomen: benighn, BS positve, no tenderness, no AAA no bruit.  No HSM or HJR Distal pulses intact with no bruits No edema Neuro non-focal Skin warm and dry No muscular weakness   Current Outpatient Prescriptions  Medication Sig Dispense Refill  . albuterol (PROVENTIL HFA;VENTOLIN HFA) 108 (90 BASE) MCG/ACT inhaler Inhale 2 puffs into the lungs every 6 (six) hours as needed.  Shortness of breath      . albuterol (PROVENTIL) (2.5 MG/3ML) 0.083% nebulizer solution Take 3 mLs (2.5 mg total) by nebulization every 6 (six) hours as needed for wheezing.  90 mL  12  . albuterol (PROVENTIL) (5 MG/ML) 0.5% nebulizer solution Take 0.5 mLs (2.5 mg total) by nebulization 4 (four) times daily.  20 mL  12  . ALPRAZolam (XANAX) 1 MG tablet Take 1 tablet (1 mg total) by mouth 3 (three) times daily as needed for sleep.      Marland Kitchen aspirin EC 81 MG EC tablet Take 1 tablet (81 mg total) by mouth daily.      Marland Kitchen atorvastatin (LIPITOR) 40 MG tablet Take 1 tablet (40 mg total) by mouth daily at 6 PM.  30 tablet  0  . azithromycin (ZITHROMAX) 250 MG tablet Take 1 tablet (250 mg total) by mouth daily.  3 each  0  . doxazosin (CARDURA) 8 MG tablet Take 8 mg by mouth at bedtime.       . furosemide (LASIX) 40 MG tablet Take 1 tablet (40 mg total) by mouth daily.  30 tablet  6  . guaiFENesin (ROBITUSSIN) 100 MG/5ML SOLN Take 10 mLs (200 mg total) by mouth every 4 (four) hours as needed.    0  . HYDROcodone-acetaminophen (NORCO) 10-325 MG per tablet Take 1 tablet by mouth every 6 (six) hours as needed. For pain.      Marland Kitchen ipratropium (ATROVENT) 0.02 % nebulizer solution Take 2.5 mLs (0.5 mg total) by nebulization 4 (four) times daily.  75 mL  12  . isosorbide mononitrate (IMDUR) 30 MG 24 hr tablet  Take 1 tablet (30 mg total) by mouth daily.  30 tablet  0  . lisinopril (PRINIVIL,ZESTRIL) 10 MG tablet Take 10 mg by mouth every morning.      . nicotine (NICODERM CQ - DOSED IN MG/24 HOURS) 21 mg/24hr patch Place 1 patch onto the skin daily.  28 patch  0  . potassium chloride SA (K-DUR,KLOR-CON) 20 MEQ tablet Take 1 tablet (20 mEq total) by mouth daily.  30 tablet  6  . predniSONE (DELTASONE) 10 MG tablet Take 2 tabs/d x 2d, then 1 tab/d x 3d and stop  7 tablet  0   No current facility-administered medications for this visit.    Allergies  Ativan; Morphine and related; and Ibuprofen  Electrocardiogram:  SR  rate 84  PAC no acute ischemic changes   Assessment and Plan

## 2013-04-16 NOTE — Assessment & Plan Note (Signed)
Continue attempts at quitting Chantix and nicotine replacement

## 2013-04-16 NOTE — Assessment & Plan Note (Signed)
Stable with no angina and good activity level.  Continue medical Rx  

## 2013-04-16 NOTE — Assessment & Plan Note (Signed)
Improved continue current meds, oxygen and inhalers  F/U pulmonary this month  Flu shot

## 2013-04-16 NOTE — Assessment & Plan Note (Signed)
Well controlled.  Continue current medications and low sodium Dash type diet.    

## 2013-04-23 ENCOUNTER — Encounter: Payer: Self-pay | Admitting: Internal Medicine

## 2013-04-23 ENCOUNTER — Ambulatory Visit (INDEPENDENT_AMBULATORY_CARE_PROVIDER_SITE_OTHER): Payer: Medicaid Other | Admitting: Internal Medicine

## 2013-04-23 VITALS — BP 136/82 | HR 70 | Temp 98.0°F | Ht 70.0 in | Wt 208.6 lb

## 2013-04-23 DIAGNOSIS — Z9119 Patient's noncompliance with other medical treatment and regimen: Secondary | ICD-10-CM

## 2013-04-23 DIAGNOSIS — Z9114 Patient's other noncompliance with medication regimen: Secondary | ICD-10-CM | POA: Insufficient documentation

## 2013-04-23 DIAGNOSIS — Z129 Encounter for screening for malignant neoplasm, site unspecified: Secondary | ICD-10-CM

## 2013-04-23 DIAGNOSIS — Z87898 Personal history of other specified conditions: Secondary | ICD-10-CM | POA: Insufficient documentation

## 2013-04-23 DIAGNOSIS — Z9189 Other specified personal risk factors, not elsewhere classified: Secondary | ICD-10-CM

## 2013-04-23 DIAGNOSIS — J449 Chronic obstructive pulmonary disease, unspecified: Secondary | ICD-10-CM

## 2013-04-23 DIAGNOSIS — F17200 Nicotine dependence, unspecified, uncomplicated: Secondary | ICD-10-CM

## 2013-04-23 NOTE — Assessment & Plan Note (Signed)
#  Medication Calendar and Polypharmacy  - fundamental issue is that you need to know all yoru medications real well  - so see my NP Lucas [name] soon with all your medications for med calendar visit

## 2013-04-23 NOTE — Progress Notes (Signed)
Subjective:    Patient ID: Lucas [name], male    DOB: [birth year], 67 y.o.   MRN: [id number]  HPI 67 yo man with complex PMH significant for COPD, CAD s/p CABG followed by [facility] presents to the clinic for follow up on  COPD.   #Smoking  - quit sept 2014 but having withdrawals  #Moderate COPD Spirometry 04/18/12       06/02/12 FEV1 1.64L 46%             1.94  55%   FVC 2.78L 60%               3.01  65% FEV1/FVC 59                   64   #CHF/CAD - s/p CABG. C - Oct 2013 Systolic CHF admission ef 30% - Sept 2014: Diastolic CHF admission   #Obesity  - Body mass index is 29.93 kg/(m^2).   #AECOPD  - 03/26/13 - office Rx - 04/03/13 - AECOPD with Acute Diast CHF admission though 04/06/13  #imaging  - 04/03/13 - CHF changes. NEver had CT    OV 04/23/2013   FU smoking and COPD Gold stage II in setting of Obesity, CAD with Systolic CHF in setting of non compliance and poly pharmacy and hx of Prostate CAncer   Last visit 03/26/2013 a respiratory exacerbation most likely due to congestive heart failure not otherwise specified and was admitted for few days the third week of September 2014. Since then he is quit smoking. He is not even smoking electronic cigarettes. He says he is taking Chantix as needed every few days. He does not want to take this daily because of side effects of mood changes but not suicidal. Does not want to do nictoine patch or zyban. At the same time he is having tobacco withdrawal.   REgarding medications: He did not tell my nurse that he is taking Chantix. He does not know his meds. He does not have a med calendar. HE is on aCe inhibitor for CHF. COugh is only mild. I cannot get him to figure his medications out. HE does not remember me giving ANORO a month ago   Regarding COPD/dsypnea: Feels better. COPD CAT Score is 21 and improved compared to last visit. HE does not remember me giving ANORO a month ago. HE had flu shot per hx. REfuses pneumovax  but will not clearly tell me why  Others: Suspect he has OSA/OHV. Says he snores +. FAlls asleep easily at TV in daytime. Daytime xs fatigue +. Never had sleep apnea eval     - . CAT COPD Symptom & Quality of Life Score (GSK trademark) 0 is no burden. 5 is highest burden 03/26/2013  04/23/2013   Never Cough -> Cough all the time 4 2  No phlegm in chest -> Chest is full of phlegm 4 2  No chest tightness -> Chest feels very tight 3 2  No dyspnea for 1 flight stairs/hill -> Very dyspneic for 1 flight of stairs 4 4  No limitations for ADL at home -> Very limited with ADL at home 4 2  Confident leaving home -> Not at all confident leaving home 2 2  Sleep soundly -> Do not sleep soundly because of lung condition 4 3  Lots of Energy -> No energy at all 4 4  TOTAL Score (max 40)  29 21         Review of Systems  Constitutional: Negative for fever and unexpected weight change.  HENT: Negative for congestion, dental problem, ear pain, nosebleeds, postnasal drip, rhinorrhea, sinus pressure, sneezing, sore throat and trouble swallowing.   Eyes: Negative for redness and itching.  Respiratory: Positive for shortness of breath. Negative for cough, chest tightness and wheezing.   Cardiovascular: Negative for palpitations and leg swelling.  Gastrointestinal: Negative for nausea and vomiting.  Genitourinary: Negative for dysuria.  Musculoskeletal: Negative for joint swelling.  Skin: Negative for rash.  Neurological: Negative for headaches.  Hematological: Does not bruise/bleed easily.  Psychiatric/Behavioral: Positive for dysphoric mood. The patient is not nervous/anxious.    Mr.  Current outpatient prescriptions:albuterol (PROVENTIL HFA;VENTOLIN HFA) 108 (90 BASE) MCG/ACT inhaler, Inhale 2 puffs into the lungs every 6 (six) hours as needed. Shortness of breath, Disp: , Rfl: ;  albuterol (PROVENTIL) (2.5 MG/3ML) 0.083% nebulizer solution, Take 2.5 mg by nebulization 4 (four) times daily.,  Disp: , Rfl:  ALPRAZolam (XANAX) 1 MG tablet, Take 1 tablet (1 mg total) by mouth 3 (three) times daily as needed for sleep., Disp: , Rfl: ;  aspirin EC 81 MG EC tablet, Take 1 tablet (81 mg total) by mouth daily., Disp: , Rfl: ;  doxazosin (CARDURA) 8 MG tablet, Take 8 mg by mouth at bedtime. , Disp: , Rfl: ;  furosemide (LASIX) 40 MG tablet, Take 0.5 tablets (20 mg total) by mouth daily., Disp: , Rfl:  HYDROcodone-acetaminophen (NORCO) 10-325 MG per tablet, Take 1 tablet by mouth every 6 (six) hours as needed. For pain., Disp: , Rfl: ;  ipratropium (ATROVENT) 0.02 % nebulizer solution, Take 2.5 mLs (0.5 mg total) by nebulization 4 (four) times daily., Disp: 75 mL, Rfl: 12;  isosorbide mononitrate (IMDUR) 30 MG 24 hr tablet, Take 1 tablet (30 mg total) by mouth daily., Disp: 30 tablet, Rfl: 0 lisinopril (PRINIVIL,ZESTRIL) 10 MG tablet, Take 10 mg by mouth every morning., Disp: , Rfl: ;  rosuvastatin (CRESTOR) 20 MG tablet, Take 20 mg by mouth daily., Disp: , Rfl:      Objective:   Physical Exam  Filed Vitals:   04/23/13 0902  BP: 136/82  Pulse: 70  Temp: 98 F (36.7 C)  TempSrc: Oral  Height: 5\' 10"  (1.778 m)  Weight: 208 lb 9.6 oz (94.62 kg)  SpO2: 96%    Nursing note and vitals reviewed. Constitutional: He is oriented to person, place, and time. He appears well-developed and well-nourished. No distress.  Body mass index is 32.09 kg/(m^2).   HENT:  Head: Normocephalic and atraumatic.  Right Ear: External ear normal.  Left Ear: External ear normal.  Mouth/Throat: Oropharynx is clear and moist. No oropharyngeal exudate.  mallampatti class 4.  Eyes: Conjunctivae and EOM are normal. Pupils are equal, round, and reactive to light. Right eye exhibits no discharge. Left eye exhibits no discharge. No scleral icterus.  Neck: Normal range of motion. Neck supple. No JVD present. No tracheal deviation present. No thyromegaly present.  Cardiovascular: Normal rate, regular rhythm and intact  distal pulses.  Exam reveals no gallop and no friction rub.   No murmur heard. Pulmonary/Chest: Effort normal. No respiratory distress. He has wheezes that are only faint and scattered. He has no rales. He exhibits no tenderness.  Abdominal: Soft. Bowel sounds are normal. He exhibits no distension and no mass. There is no tenderness. There is no rebound and no guarding.  Musculoskeletal: Normal range of motion. He exhibits no edema and no tenderness.  Lymphadenopathy:    He has no cervical  adenopathy.  Neurological: He is alert and oriented to person, place, and time. He has normal reflexes. No cranial nerve deficit. Coordination normal.  Skin: Skin is warm and dry. No rash noted. He is not diaphoretic. No erythema. No pallor.  Psychiatric: He has a normal mood and affect. His behavior is normal. Judgment and thought content normal.         Assessment & Plan:

## 2013-04-23 NOTE — Assessment & Plan Note (Signed)
 #  Possible Sleep Apnea  - see Dr Vassie Loll or DR Craige Cotta for evaluatio

## 2013-04-23 NOTE — Addendum Note (Signed)
Addended by: Darrell Jewel on: 04/23/2013 12:50 PM   Modules accepted: Orders

## 2013-04-23 NOTE — Patient Instructions (Addendum)
#  COPD  - stable  - continue oxygen and nebulizer/inhalers - glad you had flu shot - alpha 1 at next visit  #Cancer screen  - has smoking, copd and prostate ca hx - so needs screening CT chest for lung cancer; will discuss at followup  #SMoking  - glad you are quit - if chantix making you have weird dreams or feeling crazy, stop it  #Medication Calendar and Polypharmacy  - fundamental issue is that you need to know all yoru medications real well  - so see my NP Tammy soon with all your medications for med calendar visit  #Possible Sleep Apnea  - see Dr Vassie Loll or DR Craige Cotta for evaluation  #Followup - Tammy the NP for med calendar asap - Sleep eval visit with DRs Vassie Loll or Craige Cotta first available - Return to see me in 3 months

## 2013-04-23 NOTE — Assessment & Plan Note (Signed)
#  SMoking  - glad you are quit - if chantix making you have weird dreams or feeling crazy, stop it   3 min face to face counseling

## 2013-04-23 NOTE — Assessment & Plan Note (Signed)
#  COPD  - stable  - continue oxygen and nebulizer/inhalers - glad you had flu shot - alpha 1 at next visit  n  #Followup - Tammy the NP for med calendar asap - Sleep eval visit with DRs Vassie Loll or Craige Cotta first available - Return to see me in 3 months

## 2013-04-23 NOTE — Assessment & Plan Note (Signed)
#  Cancer screen  - has smoking, copd and prostate ca hx - so needs screening CT chest for lung cancer; will discuss at followup

## 2013-05-07 ENCOUNTER — Telehealth: Payer: Self-pay | Admitting: Internal Medicine

## 2013-05-07 NOTE — Telephone Encounter (Signed)
I spoke with pt. He scheduled a sleep consult with RA. He reports he thought he called purchase a machine like the hospital has to monitor his O2, HR, and BP. i advised pt those are not purchasable through local pharm. Nothing further needed

## 2013-05-15 ENCOUNTER — Telehealth (HOSPITAL_COMMUNITY): Payer: Self-pay

## 2013-05-15 NOTE — Telephone Encounter (Signed)
I have called and left a message with [employer] to inquire about participation in Pulmonary Rehab. Will send letter in mail and follow up.  

## 2013-05-17 ENCOUNTER — Telehealth: Payer: Self-pay | Admitting: Internal Medicine

## 2013-05-17 NOTE — Telephone Encounter (Signed)
Pt states that [facility] pulm rehab is telling him he will have to call the insurance company himself to see what they will cover.  Pt didn't think he was responsible for doing this.  Called and spoke with Liborio Nixon at [facility] and she states that that is correct.  They no longer are able to verify pt's insurance and this is now up to pt.  Called pt back and instructed him of this.  Pt verified understanding and states that he will call insurance company.

## 2013-05-18 ENCOUNTER — Telehealth: Payer: Self-pay | Admitting: Internal Medicine

## 2013-05-18 NOTE — Telephone Encounter (Signed)
I spoke with pt. He was calling as FYI. pulm rehab was approved. Nothing further needed

## 2013-05-24 ENCOUNTER — Inpatient Hospital Stay (HOSPITAL_COMMUNITY)
Admission: EM | Admit: 2013-05-24 | Discharge: 2013-05-30 | DRG: 292 | Disposition: A | Discharge status: Home Health | Payer: PRIVATE HEALTH INSURANCE | Attending: Internal Medicine | Admitting: Internal Medicine

## 2013-05-24 ENCOUNTER — Telehealth: Payer: Self-pay | Admitting: Internal Medicine

## 2013-05-24 ENCOUNTER — Emergency Department (HOSPITAL_COMMUNITY): Payer: PRIVATE HEALTH INSURANCE

## 2013-05-24 ENCOUNTER — Encounter (HOSPITAL_COMMUNITY): Payer: Self-pay | Admitting: Emergency Medicine

## 2013-05-24 DIAGNOSIS — E669 Obesity, unspecified: Secondary | ICD-10-CM

## 2013-05-24 DIAGNOSIS — E785 Hyperlipidemia, unspecified: Secondary | ICD-10-CM | POA: Diagnosis present

## 2013-05-24 DIAGNOSIS — I5043 Acute on chronic combined systolic (congestive) and diastolic (congestive) heart failure: Principal | ICD-10-CM

## 2013-05-24 DIAGNOSIS — Z87898 Personal history of other specified conditions: Secondary | ICD-10-CM

## 2013-05-24 DIAGNOSIS — I129 Hypertensive chronic kidney disease with stage 1 through stage 4 chronic kidney disease, or unspecified chronic kidney disease: Secondary | ICD-10-CM | POA: Diagnosis present

## 2013-05-24 DIAGNOSIS — J441 Chronic obstructive pulmonary disease with (acute) exacerbation: Secondary | ICD-10-CM | POA: Diagnosis present

## 2013-05-24 DIAGNOSIS — N400 Enlarged prostate without lower urinary tract symptoms: Secondary | ICD-10-CM | POA: Diagnosis present

## 2013-05-24 DIAGNOSIS — J9620 Acute and chronic respiratory failure, unspecified whether with hypoxia or hypercapnia: Secondary | ICD-10-CM

## 2013-05-24 DIAGNOSIS — Z87891 Personal history of nicotine dependence: Secondary | ICD-10-CM

## 2013-05-24 DIAGNOSIS — R0602 Shortness of breath: Secondary | ICD-10-CM

## 2013-05-24 DIAGNOSIS — I2510 Atherosclerotic heart disease of native coronary artery without angina pectoris: Secondary | ICD-10-CM | POA: Diagnosis present

## 2013-05-24 DIAGNOSIS — Z8249 Family history of ischemic heart disease and other diseases of the circulatory system: Secondary | ICD-10-CM

## 2013-05-24 DIAGNOSIS — J449 Chronic obstructive pulmonary disease, unspecified: Secondary | ICD-10-CM

## 2013-05-24 DIAGNOSIS — Z951 Presence of aortocoronary bypass graft: Secondary | ICD-10-CM

## 2013-05-24 DIAGNOSIS — Z85528 Personal history of other malignant neoplasm of kidney: Secondary | ICD-10-CM

## 2013-05-24 DIAGNOSIS — N181 Chronic kidney disease, stage 1: Secondary | ICD-10-CM | POA: Diagnosis present

## 2013-05-24 DIAGNOSIS — Z9981 Dependence on supplemental oxygen: Secondary | ICD-10-CM

## 2013-05-24 DIAGNOSIS — I359 Nonrheumatic aortic valve disorder, unspecified: Secondary | ICD-10-CM | POA: Diagnosis present

## 2013-05-24 DIAGNOSIS — I509 Heart failure, unspecified: Secondary | ICD-10-CM | POA: Diagnosis present

## 2013-05-24 DIAGNOSIS — I252 Old myocardial infarction: Secondary | ICD-10-CM

## 2013-05-24 DIAGNOSIS — J9610 Chronic respiratory failure, unspecified whether with hypoxia or hypercapnia: Secondary | ICD-10-CM | POA: Diagnosis present

## 2013-05-24 DIAGNOSIS — I10 Essential (primary) hypertension: Secondary | ICD-10-CM

## 2013-05-24 DIAGNOSIS — F4322 Adjustment disorder with anxiety: Secondary | ICD-10-CM | POA: Diagnosis present

## 2013-05-24 DIAGNOSIS — Z833 Family history of diabetes mellitus: Secondary | ICD-10-CM

## 2013-05-24 DIAGNOSIS — Z79899 Other long term (current) drug therapy: Secondary | ICD-10-CM

## 2013-05-24 LAB — CBC
MCH: 29.9 pg (ref 26.0–34.0)
MCV: 90.4 fL (ref 78.0–100.0)
Platelets: 210 10*3/uL (ref 150–400)
RBC: 4.69 MIL/uL (ref 4.22–5.81)
RDW: 14.1 % (ref 11.5–15.5)
WBC: 8.3 10*3/uL (ref 4.0–10.5)

## 2013-05-24 LAB — TROPONIN I
Troponin I: <0.3 ng/mL (ref <0.30)
Troponin I: <0.3 ng/mL (ref <0.30)

## 2013-05-24 LAB — BASIC METABOLIC PANEL
CO2: 23 mEq/L (ref 19–32)
Calcium: 9.9 mg/dL (ref 8.4–10.5)
Creatinine, Ser: 1.22 mg/dL (ref 0.50–1.35)
GFR calc Af Amer: 69 mL/min — ABNORMAL LOW (ref >90)
Glucose, Bld: 121 mg/dL — ABNORMAL HIGH (ref 70–99)
Potassium: 3.9 mEq/L (ref 3.5–5.1)
Sodium: 137 mEq/L (ref 135–145)

## 2013-05-24 LAB — PRO B NATRIURETIC PEPTIDE: Pro B Natriuretic peptide (BNP): 4622 pg/mL — ABNORMAL HIGH (ref 0–125)

## 2013-05-24 MED ORDER — ENOXAPARIN SODIUM 40 MG/0.4ML ~~LOC~~ SOLN
40.0000 mg | SUBCUTANEOUS | Status: DC
  Administered 2013-05-24 – 2013-05-29 (×6): 40 mg via SUBCUTANEOUS
  Filled 2013-05-24 (×7): qty 0.4

## 2013-05-24 MED ORDER — SODIUM CHLORIDE 0.9 % IJ SOLN
3.0000 mL | Freq: Two times a day (BID) | INTRAMUSCULAR | Status: DC
  Administered 2013-05-26 – 2013-05-29 (×3): 3 mL via INTRAVENOUS

## 2013-05-24 MED ORDER — ALBUTEROL (5 MG/ML) CONTINUOUS INHALATION SOLN
10.0000 mg/h | INHALATION_SOLUTION | Freq: Once | RESPIRATORY_TRACT | Status: AC
  Administered 2013-05-24: 10 mg/h via RESPIRATORY_TRACT
  Filled 2013-05-24: qty 20

## 2013-05-24 MED ORDER — METHYLPREDNISOLONE SODIUM SUCC 125 MG IJ SOLR
125.0000 mg | Freq: Once | INTRAMUSCULAR | Status: AC
  Administered 2013-05-24: 125 mg via INTRAVENOUS
  Filled 2013-05-24: qty 2

## 2013-05-24 MED ORDER — LISINOPRIL 10 MG PO TABS
10.0000 mg | ORAL_TABLET | Freq: Every morning | ORAL | Status: DC
  Administered 2013-05-25 – 2013-05-30 (×6): 10 mg via ORAL
  Filled 2013-05-24 (×6): qty 1

## 2013-05-24 MED ORDER — IPRATROPIUM BROMIDE 0.02 % IN SOLN
0.5000 mg | Freq: Four times a day (QID) | RESPIRATORY_TRACT | Status: DC
  Administered 2013-05-24 – 2013-05-25 (×3): 0.5 mg via RESPIRATORY_TRACT
  Filled 2013-05-24 (×3): qty 2.5

## 2013-05-24 MED ORDER — METHYLPREDNISOLONE SODIUM SUCC 125 MG IJ SOLR
60.0000 mg | Freq: Four times a day (QID) | INTRAMUSCULAR | Status: DC
  Administered 2013-05-24 – 2013-05-26 (×6): 60 mg via INTRAVENOUS
  Filled 2013-05-24 (×11): qty 0.96

## 2013-05-24 MED ORDER — ONDANSETRON HCL 4 MG/2ML IJ SOLN: 4.0000 mg | Freq: Four times a day (QID) | INTRAMUSCULAR | Status: DC | PRN

## 2013-05-24 MED ORDER — ACETAMINOPHEN 325 MG PO TABS: 650.0000 mg | ORAL_TABLET | Freq: Four times a day (QID) | ORAL | Status: DC | PRN

## 2013-05-24 MED ORDER — ONDANSETRON HCL 4 MG/2ML IJ SOLN
4.0000 mg | Freq: Once | INTRAMUSCULAR | Status: AC
  Administered 2013-05-24: 4 mg via INTRAVENOUS
  Filled 2013-05-24: qty 2

## 2013-05-24 MED ORDER — IPRATROPIUM BROMIDE 0.02 % IN SOLN
1.0000 mg | Freq: Once | RESPIRATORY_TRACT | Status: AC
  Administered 2013-05-24: 1 mg via RESPIRATORY_TRACT
  Filled 2013-05-24: qty 5

## 2013-05-24 MED ORDER — ASPIRIN EC 81 MG PO TBEC
81.0000 mg | DELAYED_RELEASE_TABLET | Freq: Every day | ORAL | Status: DC
  Administered 2013-05-25 – 2013-05-30 (×6): 81 mg via ORAL
  Filled 2013-05-24 (×6): qty 1

## 2013-05-24 MED ORDER — MORPHINE SULFATE 2 MG/ML IJ SOLN
1.0000 mg | INTRAMUSCULAR | Status: DC | PRN
  Administered 2013-05-24 – 2013-05-28 (×15): 1 mg via INTRAVENOUS
  Filled 2013-05-24 (×16): qty 1

## 2013-05-24 MED ORDER — ALPRAZOLAM 1 MG PO TABS
1.0000 mg | ORAL_TABLET | Freq: Three times a day (TID) | ORAL | Status: DC | PRN
  Administered 2013-05-24 – 2013-05-30 (×17): 1 mg via ORAL
  Filled 2013-05-24: qty 1
  Filled 2013-05-24: qty 2
  Filled 2013-05-24 (×16): qty 1

## 2013-05-24 MED ORDER — FUROSEMIDE 10 MG/ML IJ SOLN
80.0000 mg | Freq: Once | INTRAMUSCULAR | Status: AC
  Administered 2013-05-24: 80 mg via INTRAVENOUS
  Filled 2013-05-24: qty 8

## 2013-05-24 MED ORDER — SODIUM CHLORIDE 0.9 % IJ SOLN
3.0000 mL | Freq: Two times a day (BID) | INTRAMUSCULAR | Status: DC
  Administered 2013-05-24 – 2013-05-30 (×10): 3 mL via INTRAVENOUS

## 2013-05-24 MED ORDER — DOXAZOSIN MESYLATE 8 MG PO TABS
8.0000 mg | ORAL_TABLET | Freq: Every day | ORAL | Status: DC
Start: 2013-05-24 — End: 2013-05-24
  Filled 2013-05-24: qty 1

## 2013-05-24 MED ORDER — ALBUTEROL SULFATE (5 MG/ML) 0.5% IN NEBU
2.5000 mg | INHALATION_SOLUTION | Freq: Four times a day (QID) | RESPIRATORY_TRACT | Status: DC
  Administered 2013-05-24 – 2013-05-25 (×3): 2.5 mg via RESPIRATORY_TRACT
  Filled 2013-05-24 (×3): qty 0.5

## 2013-05-24 MED ORDER — LEVOFLOXACIN 750 MG PO TABS
750.0000 mg | ORAL_TABLET | ORAL | Status: DC
  Administered 2013-05-24 – 2013-05-30 (×7): 750 mg via ORAL
  Filled 2013-05-24 (×8): qty 1

## 2013-05-24 MED ORDER — FUROSEMIDE 20 MG PO TABS
20.0000 mg | ORAL_TABLET | Freq: Every day | ORAL | Status: DC
  Administered 2013-05-25 – 2013-05-30 (×6): 20 mg via ORAL
  Filled 2013-05-24 (×7): qty 1

## 2013-05-24 MED ORDER — GUAIFENESIN ER 600 MG PO TB12
1200.0000 mg | ORAL_TABLET | Freq: Two times a day (BID) | ORAL | Status: DC
Start: 2013-05-24 — End: 2013-05-30
  Administered 2013-05-24 – 2013-05-30 (×9): 1200 mg via ORAL
  Filled 2013-05-24 (×13): qty 2

## 2013-05-24 MED ORDER — ONDANSETRON HCL 4 MG PO TABS
4.0000 mg | ORAL_TABLET | Freq: Four times a day (QID) | ORAL | Status: DC | PRN
  Administered 2013-05-25 – 2013-05-29 (×3): 4 mg via ORAL
  Filled 2013-05-24 (×3): qty 1

## 2013-05-24 MED ORDER — SODIUM CHLORIDE 0.9 % IJ SOLN
3.0000 mL | INTRAMUSCULAR | Status: DC | PRN
  Administered 2013-05-28: 3 mL via INTRAVENOUS

## 2013-05-24 MED ORDER — ACETAMINOPHEN 650 MG RE SUPP: 650.0000 mg | Freq: Four times a day (QID) | RECTAL | Status: DC | PRN

## 2013-05-24 MED ORDER — DOXAZOSIN MESYLATE 8 MG PO TABS
8.0000 mg | ORAL_TABLET | Freq: Every day | ORAL | Status: DC
  Administered 2013-05-25 – 2013-05-29 (×5): 8 mg via ORAL
  Filled 2013-05-24 (×7): qty 1

## 2013-05-24 MED ORDER — ALBUTEROL SULFATE (5 MG/ML) 0.5% IN NEBU: 2.5000 mg | INHALATION_SOLUTION | RESPIRATORY_TRACT | Status: DC | PRN

## 2013-05-24 MED ORDER — HYDROCODONE-ACETAMINOPHEN 10-325 MG PO TABS
1.0000 | ORAL_TABLET | Freq: Four times a day (QID) | ORAL | Status: DC | PRN
  Administered 2013-05-25 – 2013-05-30 (×15): 1 via ORAL
  Filled 2013-05-24 (×15): qty 1

## 2013-05-24 MED ORDER — SODIUM CHLORIDE 0.9 % IV SOLN
250.0000 mL | INTRAVENOUS | Status: DC | PRN
  Administered 2013-05-24: 22:00:00 250 mL via INTRAVENOUS

## 2013-05-24 NOTE — ED Notes (Signed)
[facility] called for report.RN willcall back

## 2013-05-24 NOTE — Telephone Encounter (Signed)
Sounds like he needs to call 911 and go to ER instead of calling the office.  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 Kalman Shan, M.D., [facility].C.P Pulmonary and Critical Care Medicine Staff Physician [facility] [place] [facility] Pager: [phone], If no answer or between  15:00h - 7:00h: call [phone]  05/24/2013 3:21 PM

## 2013-05-24 NOTE — H&P (Signed)
[facility] History and Physical  Lucas [name] ZOX:[id number] DOB: 12-11-1945 DOA: 05/24/2013  Referring physician: er PCP: Alva Garnet., MD  Specialists:   Chief Complaint: SOB  HPI: Lucas [name] is a 67 y.o. male  With multiple medical issues brought in by EMS with SOB- that began today.  He has COPD/CHF that he wears O2 occasionally for.  When EMS arrived, he was wheezing, O2 sats on 2L were in the 80s.  He improved when EMS gave him albuterol.  This AM he also had an episode of chest pain.  This has since resolved.  He did have sweating at the time of chest pain. + leg swelling- decreased with lasix given in ER.  He reports he has not smoked since his last hospitalization  In the ER, his BNP was elevated and was given lasix with some improvement.  He was also given albuterol with improvement- he was weaned from 6L to 4L.    Review of Systems: all systems reviewed, negative unless stated above   Past Medical History  Diagnosis Date  . CAD (coronary artery disease)   . Hypertension   . Anxiety   . History of kidney cancer   . ADENOCARCINOMA, PROSTATE   . Adjustment disorder with anxiety   . Hyperlipidemia   . BPH (benign prostatic hypertrophy)   . COPD (chronic obstructive pulmonary disease)   . CAD (coronary artery disease)   . Myocardial infarction   . Shortness of breath   . Arthritis    Past Surgical History  Procedure Laterality Date  . Coronary artery bypass graft      x 5  . Cardiac catheterization    . Nephrectomy      left nephrectomy for ca  . Posterior cervical laminectomy      x 8   limited ROM  and can't lie flat  . Heart stents      x 5  . Robot assisted laparoscopic radical prostatectomy      for prostate cancer  . Cystoscopy with litholapaxy  05/01/2012    Procedure: CYSTOSCOPY WITH LITHOLAPAXY;  Surgeon: Crecencio Mc, MD;  Location: [facility];  Service: Urology;  Laterality: N/A;   Social History:  reports that he quit smoking  about 8 weeks ago. His smoking use included Cigarettes. He has a 16.2 pack-year smoking history. He quit smokeless tobacco use about 13 months ago. He reports that he does not drink alcohol or use illicit drugs.   Allergies  Allergen Reactions  . Ativan [Lorazepam] Other (See Comments)    Increases agitation, tolerates xanax  . Morphine And Related     Hallucinations, too sedated when given with ativan  . Ibuprofen Rash    Tolerates aspirin per patient.      Family History  Problem Relation Age of Onset  . Diabetes Mother   . Coronary artery disease    . Heart disease Mother   . Heart disease Father   . Heart disease Brother   . Diabetes Father     Prior to Admission medications   Medication Sig Start Date End Date Taking? Authorizing Provider  albuterol (PROVENTIL HFA;VENTOLIN HFA) 108 (90 BASE) MCG/ACT inhaler Inhale 2 puffs into the lungs every 6 (six) hours as needed. Shortness of breath   Yes Historical Provider, MD  albuterol (PROVENTIL) (2.5 MG/3ML) 0.083% nebulizer solution Take 2.5 mg by nebulization 4 (four) times daily. 04/06/13  Yes Simonne Martinet, NP  ALPRAZolam Prudy Feeler) 1 MG tablet Take 1 tablet (  1 mg total) by mouth 3 (three) times daily as needed for sleep. 04/05/13  Yes Bernadene Person, NP  aspirin EC 81 MG EC tablet Take 1 tablet (81 mg total) by mouth daily. 04/05/13  Yes Bernadene Person, NP  doxazosin (CARDURA) 8 MG tablet Take 8 mg by mouth at bedtime.    Yes Historical Provider, MD  furosemide (LASIX) 40 MG tablet Take 0.5 tablets (20 mg total) by mouth daily. 04/16/13  Yes Wendall Stade, MD  HYDROcodone-acetaminophen (NORCO) 10-325 MG per tablet Take 1 tablet by mouth every 6 (six) hours as needed. For pain.   Yes Historical Provider, MD  ipratropium (ATROVENT) 0.02 % nebulizer solution Take 2.5 mLs (0.5 mg total) by nebulization 4 (four) times daily. 04/06/13  Yes Simonne Martinet, NP  isosorbide mononitrate (IMDUR) 30 MG 24 hr tablet Take 1 tablet (30 mg  total) by mouth daily. 04/05/13  Yes Bernadene Person, NP  lisinopril (PRINIVIL,ZESTRIL) 10 MG tablet Take 10 mg by mouth every morning. 04/25/12  Yes Hollice Espy, MD  rosuvastatin (CRESTOR) 20 MG tablet Take 20 mg by mouth daily.   Yes Historical Provider, MD   Physical Exam: Filed Vitals:   05/24/13 1628  BP: 164/62  Pulse: 90  Temp: 98.8 F (37.1 C)  Resp: 15     General:  A+Ox3, NAD- mild increase in work of breathing  Eyes: wnl  ENT: moist  Neck: supple, no JVD  Cardiovascular: rrr  Respiratory: diminished breath sounds, no wheezing  Abdomen: +BS, obese  Skin: no rashes or lesions  Musculoskeletal: moves all 4 ext  Psychiatric: anxious  Neurologic: CN 2-12 intact  Labs on Admission:  Basic Metabolic Panel:  Recent Labs Lab 05/24/13 1640  NA 137  K 3.9  CL 102  CO2 23  GLUCOSE 121*  BUN 21  CREATININE 1.22  CALCIUM 9.9   Liver Function Tests: No results found for this basename: AST, ALT, ALKPHOS, BILITOT, PROT, ALBUMIN,  in the last 168 hours No results found for this basename: LIPASE, AMYLASE,  in the last 168 hours No results found for this basename: AMMONIA,  in the last 168 hours CBC:  Recent Labs Lab 05/24/13 1640  WBC 8.3  HGB 14.0  HCT 42.4  MCV 90.4  PLT 210   Cardiac Enzymes:  Recent Labs Lab 05/24/13 1640  TROPONINI <0.30    BNP (last 3 results)  Recent Labs  04/03/13 1210 04/16/13 1027 05/24/13 1633  PROBNP 7888.0* 827.0* 4622.0*   CBG: No results found for this basename: GLUCAP,  in the last 168 hours  Radiological Exams on Admission: Dg Chest Port 1 View  05/24/2013   CLINICAL DATA:  Shortness of breath, hypoxia  EXAM: PORTABLE CHEST - 1 VIEW  COMPARISON:  04/03/2013  FINDINGS: Cardiomediastinal silhouette is stable. Mild interstitial prominence bilaterally without pulmonary edema. No segmental infiltrate status post CABG again noted.  IMPRESSION: No segmental infiltrate. Mild interstitial prominence  without pulmonary edema. Status post CABG.   Electronically Signed   By: Natasha Mead M.D.   On: 05/24/2013 16:50    EKG: Independently reviewed. Sinus with PVC  Assessment/Plan Active Problems:   HYPERTENSION   Obesity   Acute exacerbation of chronic obstructive pulmonary disease (COPD)   Acute on chronic combined systolic and diastolic congestive heart failure, NYHA class 4   SOB (shortness of breath)   Acute-on-chronic respiratory failure   1. Acute on chronic resp failure- IV lasix x 1; monitor I/os, daily  weights, nebs, steroids, abx 2. COPD exacerbation- see above 3. Acute on chronic combined CHF- see above, trend BNP 4. SOB- due to combo of 2 and 3 5. HTN- home meds plus PRN hydralazine 6. Obesity- encourage weight loss 7. Recent tobacco abuse- has not smoked since sept 8. Anxiety- xanax    Code Status: full Family Communication: patient Disposition Plan: admit  Time spent: 75 min  Cornie Mccomber [facility] Pager [phone]  If 7PM-7AM, please contact night-coverage [email or web address] Password [facility] 05/24/2013, 6:56 PM

## 2013-05-24 NOTE — Telephone Encounter (Signed)
Called, spoke with pt.  Informed him of below recs per MR.  He verbalized understanding of this.  Pt states he he can't lay down and shut his eyes bc he is afraid he will not wake up.  Advised he should call 911 to be taken to the ER.  He verbalized understanding, and states he will call 911.  Nothing further needed from office at this time.

## 2013-05-24 NOTE — ED Notes (Signed)
Per ems: pt from home, hx of COPD - increase of SOB today. Wheezing in all lung fields. Total of 10mg  albuterol and 0.5mg  atrovent given by EMS. Pt uses home oxygen as needed. Cardiac hx of past MI and stent placement. Pt ambulatory for ems, insisted on walking with dyspnea with exertion. Pt reported chest pain at 0800 this morning, denies any chest pain now. No diaphoresis. Hx of paracentesis bp 174/84, pulse 94 w PVCs, respirations 20

## 2013-05-24 NOTE — ED Notes (Signed)
Delay in getting pt to a room was d/t initial admission to [facility]  Dr. Gwendolyn Grant was informed at around 19:10 and a new bed request was put in for [facility].

## 2013-05-24 NOTE — Telephone Encounter (Signed)
Pt c/o increased coughing with the feeling of passing out x 2-3 days>> worsening Pt states that he keeps breaking out in terrible sweats and has increased SOB with these spells.  Reports that with last spell he went to call 911 and could not d/t being so faint.  Not using anything OTC for his cough.  Would like recs on something to give some relief or appt if needed. Allergies  Allergen Reactions  . Ativan [Lorazepam] Other (See Comments)    Increases agitation, tolerates xanax  . Morphine And Related     Hallucinations, too sedated when given with ativan  . Ibuprofen Rash    Tolerates aspirin per patient.     [facility] [place]  Please advise Dr Marchelle Gearing. Thanks.

## 2013-05-24 NOTE — ED Notes (Signed)
Bed: WA04 Expected date:  Expected time:  Means of arrival:  Comments: EMS-afib

## 2013-05-24 NOTE — ED Provider Notes (Signed)
CSN: [id number]     Arrival date & time 05/24/13  1611 History   First MD Initiated Contact with Patient 05/24/13 1614     Chief Complaint  Patient presents with  . Shortness of Breath   (Consider location/radiation/quality/duration/timing/severity/associated sxs/prior Treatment) Patient is a 67 y.o. male presenting with shortness of breath. The history is provided by the patient.  Shortness of Breath Severity:  Severe Onset quality:  Sudden Timing:  Constant Progression:  Worsening Chronicity:  Recurrent Context: weather changes (cold exposure)   Relieved by:  Nothing Worsened by:  Nothing tried Ineffective treatments:  None tried Associated symptoms: chest pain (this morning for 2.5 hours), cough, diaphoresis, syncope (near-syncope) and wheezing   Associated symptoms: no abdominal pain, no claudication, no fever, no rash and no vomiting     Past Medical History  Diagnosis Date  . CAD (coronary artery disease)   . Hypertension   . Anxiety   . History of kidney cancer   . ADENOCARCINOMA, PROSTATE   . Adjustment disorder with anxiety   . Hyperlipidemia   . BPH (benign prostatic hypertrophy)   . COPD (chronic obstructive pulmonary disease)   . CAD (coronary artery disease)   . Myocardial infarction   . Shortness of breath   . Arthritis    Past Surgical History  Procedure Laterality Date  . Coronary artery bypass graft      x 5  . Cardiac catheterization    . Nephrectomy      left nephrectomy for ca  . Posterior cervical laminectomy      x 8   limited ROM  and can't lie flat  . Heart stents      x 5  . Robot assisted laparoscopic radical prostatectomy      for prostate cancer  . Cystoscopy with litholapaxy  05/01/2012    Procedure: CYSTOSCOPY WITH LITHOLAPAXY;  Surgeon: Crecencio Mc, MD;  Location: [facility];  Service: Urology;  Laterality: N/A;   Family History  Problem Relation Age of Onset  . Diabetes Mother   . Coronary artery disease    . Heart disease Mother    . Heart disease Father   . Heart disease Brother   . Diabetes Father    History  Substance Use Topics  . Smoking status: Former Smoker -- 0.30 packs/day for 54 years    Types: Cigarettes    Quit date: 03/29/2013  . Smokeless tobacco: Former Neurosurgeon    Quit date: 04/19/2012     Comment: Using e-cig now  . Alcohol Use: No    Review of Systems  Constitutional: Positive for diaphoresis. Negative for fever.  Respiratory: Positive for cough, shortness of breath and wheezing.   Cardiovascular: Positive for chest pain (this morning for 2.5 hours), leg swelling (constant, trace) and syncope (near-syncope). Negative for claudication.  Gastrointestinal: Negative for vomiting and abdominal pain.  Skin: Negative for rash.  All other systems reviewed and are negative.    Allergies  Ativan; Morphine and related; and Ibuprofen  Home Medications   Current Outpatient Rx  Name  Route  Sig  Dispense  Refill  . albuterol (PROVENTIL HFA;VENTOLIN HFA) 108 (90 BASE) MCG/ACT inhaler   Inhalation   Inhale 2 puffs into the lungs every 6 (six) hours as needed. Shortness of breath         . albuterol (PROVENTIL) (2.5 MG/3ML) 0.083% nebulizer solution   Nebulization   Take 2.5 mg by nebulization 4 (four) times daily.         Marland Kitchen  ALPRAZolam (XANAX) 1 MG tablet   Oral   Take 1 tablet (1 mg total) by mouth 3 (three) times daily as needed for sleep.         Marland Kitchen aspirin EC 81 MG EC tablet   Oral   Take 1 tablet (81 mg total) by mouth daily.         Marland Kitchen doxazosin (CARDURA) 8 MG tablet   Oral   Take 8 mg by mouth at bedtime.          . furosemide (LASIX) 40 MG tablet   Oral   Take 0.5 tablets (20 mg total) by mouth daily.         Marland Kitchen HYDROcodone-acetaminophen (NORCO) 10-325 MG per tablet   Oral   Take 1 tablet by mouth every 6 (six) hours as needed. For pain.         Marland Kitchen ipratropium (ATROVENT) 0.02 % nebulizer solution   Nebulization   Take 2.5 mLs (0.5 mg total) by nebulization 4 (four)  times daily.   75 mL   12   . isosorbide mononitrate (IMDUR) 30 MG 24 hr tablet   Oral   Take 1 tablet (30 mg total) by mouth daily.   30 tablet   0   . lisinopril (PRINIVIL,ZESTRIL) 10 MG tablet   Oral   Take 10 mg by mouth every morning.         . rosuvastatin (CRESTOR) 20 MG tablet   Oral   Take 20 mg by mouth daily.          BP 164/62  Pulse 90  Temp(Src) 98.8 F (37.1 C) (Oral)  Resp 15  SpO2 92% Physical Exam  Nursing note and vitals reviewed. Constitutional: He is oriented to person, place, and time. He appears well-developed and well-nourished. No distress.  HENT:  Head: Normocephalic and atraumatic.  Mouth/Throat: No oropharyngeal exudate.  Eyes: EOM are normal. Pupils are equal, round, and reactive to light.  Neck: Normal range of motion. Neck supple.  Cardiovascular: Normal rate and regular rhythm.  Exam reveals no friction rub.   No murmur heard. Pulmonary/Chest: He is in respiratory distress (mild). He has wheezes (diffuse). He has no rales.  Abdominal: Soft. He exhibits no distension. There is no tenderness. There is no rebound.  Musculoskeletal: Normal range of motion. He exhibits no edema.  Neurological: He is alert and oriented to person, place, and time.  Skin: He is not diaphoretic.    ED Course  Procedures (including critical care time) Labs Review Labs Reviewed  BASIC METABOLIC PANEL - Abnormal; Notable for the following:    Glucose, Bld 121 (*)    GFR calc non Af Amer 60 (*)    GFR calc Af Amer 69 (*)    All other components within normal limits  PRO B NATRIURETIC PEPTIDE - Abnormal; Notable for the following:    Pro B Natriuretic peptide (BNP) 4622.0 (*)    All other components within normal limits  CBC  TROPONIN I  TROPONIN I  TROPONIN I  TROPONIN I  PRO B NATRIURETIC PEPTIDE  BASIC METABOLIC PANEL  CBC   Imaging Review Dg Chest Port 1 View  05/24/2013   CLINICAL DATA:  Shortness of breath, hypoxia  EXAM: PORTABLE CHEST - 1  VIEW  COMPARISON:  04/03/2013  FINDINGS: Cardiomediastinal silhouette is stable. Mild interstitial prominence bilaterally without pulmonary edema. No segmental infiltrate status post CABG again noted.  IMPRESSION: No segmental infiltrate. Mild interstitial prominence without pulmonary edema. Status  post CABG.   Electronically Signed   By: Natasha Mead M.D.   On: 05/24/2013 16:50    EKG Interpretation     Ventricular Rate:    PR Interval:    QRS Duration:   QT Interval:    QTC Calculation:   R Axis:     Text Interpretation:              Date: 05/24/2013  Rate: 86  Rhythm: normal sinus rhythm  QRS Axis: normal  Intervals: normal  ST/T Wave abnormalities: normal  Conduction Disutrbances:none  Narrative Interpretation: Multiple PVCs  Old EKG Reviewed: none available   MDM   1. SOB (shortness of breath)   2. Acute exacerbation of chronic obstructive pulmonary disease (COPD)   3. Acute on chronic combined systolic and diastolic congestive heart failure, NYHA class 4   4. Obesity    2M with extensive CAD history and COPD on home O2 at night presents with SOB. Felt SOB for past 3-4 weeks, worsened this morning when he hit the cold air. Patient had constant chest pain from 2-430 this morning. No chest pain now. EMS noted sats in the low 80s upon their arrival, he greatly improved with albuterol treatment. Here, satting in the mid-90s on 6L O2 and on breathing treatment. Diffuse wheezing, talking in complete sentences, but hurried speech appreciated. Belly benign, trace pedal edema. Concern for COPD exacerbation, will also check cardiac labs. On re-exam, wheezing improved. Labs show elevated BNP, normal troponin. Lasix given. Admitted for COPD exacerbation, possible CHF exacerbation.     Dagmar Hait, MD 05/24/13 [phone]

## 2013-05-25 DIAGNOSIS — J9620 Acute and chronic respiratory failure, unspecified whether with hypoxia or hypercapnia: Secondary | ICD-10-CM

## 2013-05-25 LAB — BASIC METABOLIC PANEL
Creatinine, Ser: 1.37 mg/dL — ABNORMAL HIGH (ref 0.50–1.35)
GFR calc Af Amer: 60 mL/min — ABNORMAL LOW (ref >90)
GFR calc non Af Amer: 52 mL/min — ABNORMAL LOW (ref >90)
Potassium: 4.2 mEq/L (ref 3.5–5.1)
Sodium: 139 mEq/L (ref 135–145)

## 2013-05-25 LAB — CBC
Hemoglobin: 13.8 g/dL (ref 13.0–17.0)
Platelets: 211 10*3/uL (ref 150–400)
RBC: 4.67 MIL/uL (ref 4.22–5.81)

## 2013-05-25 LAB — PRO B NATRIURETIC PEPTIDE: Pro B Natriuretic peptide (BNP): 5232 pg/mL — ABNORMAL HIGH (ref 0–125)

## 2013-05-25 LAB — TROPONIN I: Troponin I: <0.3 ng/mL (ref <0.30)

## 2013-05-25 MED ORDER — BENZONATATE 100 MG PO CAPS
100.0000 mg | ORAL_CAPSULE | Freq: Three times a day (TID) | ORAL | Status: DC | PRN
  Administered 2013-05-25 – 2013-05-27 (×4): 100 mg via ORAL
  Filled 2013-05-25 (×5): qty 1

## 2013-05-25 MED ORDER — ALPRAZOLAM 1 MG PO TABS
1.0000 mg | ORAL_TABLET | Freq: Once | ORAL | Status: AC
  Administered 2013-05-25: 1 mg via ORAL
  Filled 2013-05-25: qty 1

## 2013-05-25 MED ORDER — HYDROCOD POLST-CHLORPHEN POLST 10-8 MG/5ML PO LQCR
10.0000 mL | Freq: Two times a day (BID) | ORAL | Status: DC | PRN
  Administered 2013-05-25 – 2013-05-26 (×2): 10 mL via ORAL
  Administered 2013-05-27: 5 mL via ORAL
  Administered 2013-05-27 – 2013-05-30 (×5): 10 mL via ORAL
  Filled 2013-05-25 (×8): qty 10

## 2013-05-25 MED ORDER — ALBUTEROL SULFATE (5 MG/ML) 0.5% IN NEBU
2.5000 mg | INHALATION_SOLUTION | RESPIRATORY_TRACT | Status: DC
  Administered 2013-05-25 – 2013-05-26 (×5): 2.5 mg via RESPIRATORY_TRACT
  Filled 2013-05-25 (×4): qty 0.5

## 2013-05-25 MED ORDER — IPRATROPIUM BROMIDE 0.02 % IN SOLN
0.5000 mg | RESPIRATORY_TRACT | Status: DC
  Administered 2013-05-25 – 2013-05-26 (×5): 0.5 mg via RESPIRATORY_TRACT
  Filled 2013-05-25 (×4): qty 2.5

## 2013-05-25 MED ORDER — ALBUTEROL SULFATE (5 MG/ML) 0.5% IN NEBU
2.5000 mg | INHALATION_SOLUTION | RESPIRATORY_TRACT | Status: DC | PRN
  Filled 2013-05-25: qty 0.5

## 2013-05-25 MED ORDER — IPRATROPIUM BROMIDE 0.02 % IN SOLN
0.5000 mg | RESPIRATORY_TRACT | Status: DC | PRN
  Filled 2013-05-25: qty 2.5

## 2013-05-25 NOTE — Progress Notes (Signed)
[facility] PROGRESS NOTE  Lucas [name] UJW:[id number] DOB: [birth year] DOA: 05/24/2013 PCP: Alva Garnet., MD  Assessment/Plan: 1. Acute on chronic resp failure- 1. Currently on minimal O2 support  2. IV lasix x 1 given in the ED 3. CXR w/o signs of pulm edema 4. Cont nebs as needed 5. Cont steroids 6. Was started on empiric Levaquin 2. COPD exacerbation- see above 3. Suspected Acute on chronic combined CHF 1. see above 2. trend BNP 4. HTN 1. home meds plus PRN hydralazine 2. BP stable and controlled 5. Obesity 1. encourage weight loss 6. Recent tobacco abuse 1. has not smoked since sept 7. Anxiety 1. Xanax as needed  Code Status: Full Family Communication: Pt in room (indicate person spoken with, relationship, and if by phone, the number) Disposition Plan: Pending  Antibiotics:  Levaquin 05/24/13>>>  HPI/Subjective: No acute events noted overnight  Objective: Filed Vitals:   05/24/13 1653 05/24/13 1930 05/24/13 2014 05/25/13 0449  BP:   142/80 116/46  Pulse:  90 82 66  Temp:   98.5 F (36.9 C) 97.7 F (36.5 C)  TempSrc:   Oral Oral  Resp:   22 20  Height:   5' 10.5" (1.791 m)   Weight:   92.987 kg (205 lb)   SpO2: 93% 94% 96% 96%    Intake/Output Summary (Last 24 hours) at 05/25/13 0803 Last data filed at 05/25/13 5621  Gross per 24 hour  Intake 575.67 ml  Output    945 ml  Net -369.33 ml   Filed Weights   05/24/13 2014  Weight: 92.987 kg (205 lb)    Exam:   General:  Awake, in nad  Cardiovascular: regular, s1, s2  Respiratory: decreased bs, wheezing throughout, no crackles  Abdomen: soft, nondistended  Musculoskeletal: perfused, no clubbing   Data Reviewed: Basic Metabolic Panel:  Recent Labs Lab 05/24/13 1640 05/25/13 0235  NA 137 139  K 3.9 4.2  CL 102 101  CO2 23 26  GLUCOSE 121* 179*  BUN 21 25*  CREATININE 1.22 1.37*  CALCIUM 9.9 9.4   Liver Function Tests: No results found for this basename: AST,  ALT, ALKPHOS, BILITOT, PROT, ALBUMIN,  in the last 168 hours No results found for this basename: LIPASE, AMYLASE,  in the last 168 hours No results found for this basename: AMMONIA,  in the last 168 hours CBC:  Recent Labs Lab 05/24/13 1640 05/25/13 0235  WBC 8.3 5.9  HGB 14.0 13.8  HCT 42.4 42.8  MCV 90.4 91.6  PLT 210 211   Cardiac Enzymes:  Recent Labs Lab 05/24/13 1640 05/24/13 2056 05/25/13 0235  TROPONINI <0.30 <0.30 <0.30   BNP (last 3 results)  Recent Labs  04/16/13 1027 05/24/13 1633 05/25/13 0235  PROBNP 827.0* 4622.0* 5232.0*   CBG: No results found for this basename: GLUCAP,  in the last 168 hours  No results found for this or any previous visit (from the past 240 hour(s)).   Studies: Dg Chest Port 1 View  05/24/2013   CLINICAL DATA:  Shortness of breath, hypoxia  EXAM: PORTABLE CHEST - 1 VIEW  COMPARISON:  04/03/2013  FINDINGS: Cardiomediastinal silhouette is stable. Mild interstitial prominence bilaterally without pulmonary edema. No segmental infiltrate status post CABG again noted.  IMPRESSION: No segmental infiltrate. Mild interstitial prominence without pulmonary edema. Status post CABG.   Electronically Signed   By: Natasha Mead M.D.   On: 05/24/2013 16:50    Scheduled Meds: . albuterol  2.5 mg Nebulization Q6H  .  aspirin EC  81 mg Oral Daily  . doxazosin  8 mg Oral QHS  . enoxaparin (LOVENOX) injection  40 mg Subcutaneous Q24H  . furosemide  20 mg Oral Q breakfast  . guaiFENesin  1,200 mg Oral BID  . ipratropium  0.5 mg Nebulization Q6H  . levofloxacin  750 mg Oral Q24H  . lisinopril  10 mg Oral q morning - 10a  . methylPREDNISolone (SOLU-MEDROL) injection  60 mg Intravenous Q6H  . sodium chloride  3 mL Intravenous Q12H  . sodium chloride  3 mL Intravenous Q12H   Continuous Infusions:   Active Problems:   HYPERTENSION   Obesity   Acute exacerbation of chronic obstructive pulmonary disease (COPD)   Acute on chronic combined systolic and  diastolic congestive heart failure, NYHA class 4   SOB (shortness of breath)   Acute-on-chronic respiratory failure  Time spent:  CHIU, STEPHEN K  [facility] Pager [phone]. If 7PM-7AM, please contact night-coverage at [email or web address], password [facility] 05/25/2013, 8:03 AM  LOS: 1 day

## 2013-05-25 NOTE — Care Management Note (Addendum)
    Page 1 of 2   05/30/2013     2:44:20 PM   CARE MANAGEMENT NOTE 05/30/2013  Patient:  Lucas [name], Lucas [name]   Account Number:  [id number]  Date Initiated:  05/25/2013  Documentation initiated by:  Lanier Clam  Subjective/Objective Assessment:   67 Y/O M ADMITTED W/SOB.COPD.     Action/Plan:   FROM HOME ALONE.HAS PCP,PHARMACY.   Anticipated DC Date:  05/31/2013   Anticipated DC Plan:  HOME W HOME HEALTH SERVICES      DC Planning Services  CM consult      Choice offered to / List presented to:  C-1 Patient        HH arranged  HH-1 RN  HH-10 DISEASE MANAGEMENT      HH agency  [facility]   Status of service:  In process, will continue to follow Medicare Important Message given?   (If response is "NO", the following Medicare IM given date fields will be blank) Date Medicare IM given:   Date Additional Medicare IM given:    Discharge Disposition:    Per UR Regulation:  Reviewed for med. necessity/level of care/duration of stay  If discussed at Long Length of Stay Meetings, dates discussed:   05/29/2013    Comments:  05/30/13 Adaisha Campise RN,BSN NCM [phone] HHRN ORDERED.TC [facility] REP KRISTEN AWARE OF HHRN ORDER-COPD GOLD.[facility] FOLLOWING FOR CHRONIC DZ MGMNT.ALREADY USES HOME 02-[facility] HAS TRAVEL.  05/29/13 Lashayla Armes RN,BSN NCM [phone] [facility] FOLLOWING FOR HHC.RECOMMENDING HHRN-COPD GOLD.AWAIT FINAL HHRN ORDER.[facility] FOLLOWING FOR CHRONIC DZ MGMNT.USES [facility] FOR HOME 02,HAS TRAVEL.  05/28/13 Raiven Belizaire RN,BSN NCM [phone] PT-NO F/U.WOULD RECOMMEND HHRN-COPD GOLD.[facility]HC AGENCY LIST PROVIDED AS RESOURCE.TC [facility] DME REP LECRETIA INFORMED OF PATIENT HAVING HOME 02 FROM [facility], & ALSO [facility]-HOME 02,NEBS.HE WILL CONTINUE W/[facility] DME REP WILL P/U DME.  05/25/13 Bryceson Grape RN,BSN NCM [phone] COPD GOLD.WOULD RECOMMEND PT/OT CONS,AWAIT RECOMMENDATIONS.WOULD RECOMMEND HHRN-COPD INSTRUCTION.

## 2013-05-26 DIAGNOSIS — F4322 Adjustment disorder with anxiety: Secondary | ICD-10-CM

## 2013-05-26 MED ORDER — ALBUTEROL SULFATE (5 MG/ML) 0.5% IN NEBU
2.5000 mg | INHALATION_SOLUTION | Freq: Four times a day (QID) | RESPIRATORY_TRACT | Status: DC
  Administered 2013-05-26 – 2013-05-30 (×18): 2.5 mg via RESPIRATORY_TRACT
  Filled 2013-05-26 (×19): qty 0.5

## 2013-05-26 MED ORDER — METHYLPREDNISOLONE SODIUM SUCC 125 MG IJ SOLR
60.0000 mg | Freq: Two times a day (BID) | INTRAMUSCULAR | Status: DC
  Administered 2013-05-26: 60 mg via INTRAVENOUS
  Administered 2013-05-27: 0.96 mg via INTRAVENOUS
  Filled 2013-05-26 (×4): qty 0.96

## 2013-05-26 MED ORDER — IPRATROPIUM BROMIDE 0.02 % IN SOLN
0.5000 mg | Freq: Four times a day (QID) | RESPIRATORY_TRACT | Status: DC
  Administered 2013-05-26 – 2013-05-30 (×18): 0.5 mg via RESPIRATORY_TRACT
  Filled 2013-05-26 (×19): qty 2.5

## 2013-05-26 NOTE — Progress Notes (Signed)
Pt very anxious all night. Paged NP and additional xanax ordered for patient but patient is still unable to sit still. Pt also continues to cough constantly. It is non productive.

## 2013-05-26 NOTE — Progress Notes (Signed)
[facility] PROGRESS NOTE  MARKEEM NOREEN RUE:[id number] DOB: [birth year] DOA: 05/24/2013 PCP: Alva Garnet., MD  Assessment/Plan: 1. Acute on chronic resp failure- 1. Currently on minimal O2 support (uses 2L  at home)  2. IV lasix x 1 given in the ED 3. CXR w/o signs of pulm edema 4. Cont nebs as needed 5. Cont steroids 6. Was started on empiric Levaquin 7. Pt remains symptomatic 2. COPD exacerbation- see above 3. Suspected Acute on chronic combined CHF 1. see above 2. trend BNP 3. Last echo in 03/2013 with normal EF 4. HTN 1. home meds plus PRN hydralazine 2. BP stable and controlled 5. Obesity 1. encourage weight loss 6. Recent tobacco abuse 1. has not smoked since sept 7. Anxiety 1. Xanax as needed  Code Status: Full Family Communication: Pt in room (indicate person spoken with, relationship, and if by phone, the number) Disposition Plan: Pending  Antibiotics:  Levaquin 05/24/13>>>  HPI/Subjective: No acute events noted overnight  Objective: Filed Vitals:   05/25/13 1555 05/25/13 1936 05/25/13 2150 05/26/13 0509  BP:   141/68 131/81  Pulse:   66 72  Temp:   97.5 F (36.4 C) 97.7 F (36.5 C)  TempSrc:   Oral Oral  Resp:   22 20  Height:      Weight:    93.4 kg (205 lb 14.6 oz)  SpO2: 91% 94% 97% 98%    Intake/Output Summary (Last 24 hours) at 05/26/13 1478 Last data filed at 05/26/13 0700  Gross per 24 hour  Intake   1203 ml  Output   1775 ml  Net   -572 ml   Filed Weights   05/24/13 2014 05/26/13 0509  Weight: 92.987 kg (205 lb) 93.4 kg (205 lb 14.6 oz)    Exam:   General:  Awake, in nad  Cardiovascular: regular, s1, s2  Respiratory: decreased bs, wheezing throughout, no crackles  Abdomen: soft, nondistended  Musculoskeletal: perfused, no clubbing   Data Reviewed: Basic Metabolic Panel:  Recent Labs Lab 05/24/13 1640 05/25/13 0235  NA 137 139  K 3.9 4.2  CL 102 101  CO2 23 26  GLUCOSE 121* 179*  BUN 21 25*   CREATININE 1.22 1.37*  CALCIUM 9.9 9.4   Liver Function Tests: No results found for this basename: AST, ALT, ALKPHOS, BILITOT, PROT, ALBUMIN,  in the last 168 hours No results found for this basename: LIPASE, AMYLASE,  in the last 168 hours No results found for this basename: AMMONIA,  in the last 168 hours CBC:  Recent Labs Lab 05/24/13 1640 05/25/13 0235  WBC 8.3 5.9  HGB 14.0 13.8  HCT 42.4 42.8  MCV 90.4 91.6  PLT 210 211   Cardiac Enzymes:  Recent Labs Lab 05/24/13 1640 05/24/13 2056 05/25/13 0235  TROPONINI <0.30 <0.30 <0.30   BNP (last 3 results)  Recent Labs  04/16/13 1027 05/24/13 1633 05/25/13 0235  PROBNP 827.0* 4622.0* 5232.0*   CBG: No results found for this basename: GLUCAP,  in the last 168 hours  No results found for this or any previous visit (from the past 240 hour(s)).   Studies: Dg Chest Port 1 View  05/24/2013   CLINICAL DATA:  Shortness of breath, hypoxia  EXAM: PORTABLE CHEST - 1 VIEW  COMPARISON:  04/03/2013  FINDINGS: Cardiomediastinal silhouette is stable. Mild interstitial prominence bilaterally without pulmonary edema. No segmental infiltrate status post CABG again noted.  IMPRESSION: No segmental infiltrate. Mild interstitial prominence without pulmonary edema. Status post CABG.  Electronically Signed   By: Natasha Mead M.D.   On: 05/24/2013 16:50    Scheduled Meds: . ipratropium  0.5 mg Nebulization QID   And  . albuterol  2.5 mg Nebulization QID  . aspirin EC  81 mg Oral Daily  . doxazosin  8 mg Oral QHS  . enoxaparin (LOVENOX) injection  40 mg Subcutaneous Q24H  . furosemide  20 mg Oral Q breakfast  . guaiFENesin  1,200 mg Oral BID  . levofloxacin  750 mg Oral Q24H  . lisinopril  10 mg Oral q morning - 10a  . methylPREDNISolone (SOLU-MEDROL) injection  60 mg Intravenous Q6H  . sodium chloride  3 mL Intravenous Q12H  . sodium chloride  3 mL Intravenous Q12H   Continuous Infusions:   Active Problems:   HYPERTENSION    Obesity   Acute exacerbation of chronic obstructive pulmonary disease (COPD)   Acute on chronic combined systolic and diastolic congestive heart failure, NYHA class 4   SOB (shortness of breath)   Acute-on-chronic respiratory failure  Time spent:  CHIU, STEPHEN K  [facility] Pager [phone]. If 7PM-7AM, please contact night-coverage at [email or web address], password [facility] 05/26/2013, 8:22 AM  LOS: 2 days

## 2013-05-26 NOTE — Progress Notes (Signed)
Pt sister present for visit and confronted patient about continuing to smoke-He states he is not smoking, but she told him she sees him regularly and he should tell the truth his smoking habit.  Reassured.

## 2013-05-27 DIAGNOSIS — N181 Chronic kidney disease, stage 1: Secondary | ICD-10-CM

## 2013-05-27 DIAGNOSIS — I10 Essential (primary) hypertension: Secondary | ICD-10-CM

## 2013-05-27 MED ORDER — METHYLPREDNISOLONE SODIUM SUCC 125 MG IJ SOLR
60.0000 mg | Freq: Four times a day (QID) | INTRAMUSCULAR | Status: DC
  Administered 2013-05-27 – 2013-05-28 (×4): 60 mg via INTRAVENOUS
  Filled 2013-05-27 (×8): qty 0.96

## 2013-05-27 NOTE — Progress Notes (Signed)
[facility] PROGRESS NOTE  Lucas [name] ZOX:[id number] DOB: [birth year] DOA: 05/24/2013 PCP: Alva Garnet., MD  Assessment/Plan: 1. Acute on chronic resp failure- 1. Currently on minimal O2 support (uses 2L [place] at home)  2. IV lasix x 1 given in the ED 3. CXR w/o signs of pulm edema 4. Cont nebs as needed 5. Cont steroids and wean as tolerated 6. Was started on empiric Levaquin 7. Pt remains symptomatic 2. COPD exacerbation- see above 3. Suspected Acute on chronic combined CHF 1. see above 2. trend BNP 3. Last echo in 03/2013 with normal EF 4. HTN 1. home meds plus PRN hydralazine 2. BP stable and controlled 5. Obesity 1. encourage weight loss 6. Recent tobacco abuse 1. Claims has not smoked since sept, however RN notes suggest pt continues to smoke 7. Anxiety 1. Xanax as needed  Code Status: Full Family Communication: Pt in room (indicate person spoken with, relationship, and if by phone, the number) Disposition Plan: Pending  Antibiotics:  Levaquin 05/24/13>>>  HPI/Subjective: No acute events noted overnight  Objective: Filed Vitals:   05/26/13 2127 05/27/13 0558 05/27/13 0757 05/27/13 0934  BP: 120/64 143/74  136/70  Pulse: 65 73  76  Temp: 97.7 F (36.5 C) 97.4 F (36.3 C)    TempSrc: Oral Oral    Resp: 18 20  22   Height:      Weight:  93.35 kg (205 lb 12.8 oz)    SpO2: 93% 98% 95% 94%    Intake/Output Summary (Last 24 hours) at 05/27/13 1112 Last data filed at 05/27/13 1043  Gross per 24 hour  Intake    480 ml  Output    625 ml  Net   -145 ml   Filed Weights   05/24/13 2014 05/26/13 0509 05/27/13 0558  Weight: 92.987 kg (205 lb) 93.4 kg (205 lb 14.6 oz) 93.35 kg (205 lb 12.8 oz)    Exam:   General:  Awake, in nad  Cardiovascular: regular, s1, s2  Respiratory: decreased bs, wheezing throughout, no crackles  Abdomen: soft, nondistended  Musculoskeletal: perfused, no clubbing   Data Reviewed: Basic Metabolic  Panel:  Recent Labs Lab 05/24/13 1640 05/25/13 0235  NA 137 139  K 3.9 4.2  CL 102 101  CO2 23 26  GLUCOSE 121* 179*  BUN 21 25*  CREATININE 1.22 1.37*  CALCIUM 9.9 9.4   Liver Function Tests: No results found for this basename: AST, ALT, ALKPHOS, BILITOT, PROT, ALBUMIN,  in the last 168 hours No results found for this basename: LIPASE, AMYLASE,  in the last 168 hours No results found for this basename: AMMONIA,  in the last 168 hours CBC:  Recent Labs Lab 05/24/13 1640 05/25/13 0235  WBC 8.3 5.9  HGB 14.0 13.8  HCT 42.4 42.8  MCV 90.4 91.6  PLT 210 211   Cardiac Enzymes:  Recent Labs Lab 05/24/13 1640 05/24/13 2056 05/25/13 0235  TROPONINI <0.30 <0.30 <0.30   BNP (last 3 results)  Recent Labs  04/16/13 1027 05/24/13 1633 05/25/13 0235  PROBNP 827.0* 4622.0* 5232.0*   CBG: No results found for this basename: GLUCAP,  in the last 168 hours  No results found for this or any previous visit (from the past 240 hour(s)).   Studies: No results found.  Scheduled Meds: . ipratropium  0.5 mg Nebulization QID   And  . albuterol  2.5 mg Nebulization QID  . aspirin EC  81 mg Oral Daily  . doxazosin  8 mg Oral QHS  .  enoxaparin (LOVENOX) injection  40 mg Subcutaneous Q24H  . furosemide  20 mg Oral Q breakfast  . guaiFENesin  1,200 mg Oral BID  . levofloxacin  750 mg Oral Q24H  . lisinopril  10 mg Oral q morning - 10a  . methylPREDNISolone (SOLU-MEDROL) injection  60 mg Intravenous Q12H  . sodium chloride  3 mL Intravenous Q12H  . sodium chloride  3 mL Intravenous Q12H   Continuous Infusions:   Active Problems:   HYPERTENSION   Obesity   Acute exacerbation of chronic obstructive pulmonary disease (COPD)   Acute on chronic combined systolic and diastolic congestive heart failure, NYHA class 4   SOB (shortness of breath)   Acute-on-chronic respiratory failure  Time spent:  Esiah Bazinet K  [facility] Pager [phone]. If 7PM-7AM, please  contact night-coverage at [email or web address], password [facility] 05/27/2013, 11:12 AM  LOS: 3 days

## 2013-05-27 NOTE — Progress Notes (Signed)
Pt c/o persistent midsternal/rib cage pain w/ coughing despite morphine, tussionex, ativan. Had PRN neb tx that pt states did help make cough more productive. EKG obtained, Dr Rhona Leavens present and made aware. Will see pt soon.

## 2013-05-27 NOTE — Progress Notes (Signed)
Pt had 24 second run of idioventricular rhythm in the 60s. Pt was asymptomatic. Returned to NS[facility]. Dr Rhona Leavens paged and made aware.

## 2013-05-28 MED ORDER — METHYLPREDNISOLONE SODIUM SUCC 125 MG IJ SOLR
60.0000 mg | Freq: Three times a day (TID) | INTRAMUSCULAR | Status: DC
  Administered 2013-05-28 – 2013-05-29 (×3): 60 mg via INTRAVENOUS
  Filled 2013-05-28 (×6): qty 0.96

## 2013-05-28 NOTE — Progress Notes (Signed)
PT Cancellation Note  Patient Details Name: DE JAWORSKI MRN: [id number] DOB: [birth year]   Cancelled Treatment:    Reason Eval/Treat Not Completed: PT screened, no needs identified, will sign off. Pt states he has been ambulating unassisted in hallways-RN confirms this as well. Pt denies any need for PT at this time. Pt does state he would like to resume Cardiopulmonary Rehab at [facility].    Rebeca Alert, MPT Pager: [phone]

## 2013-05-28 NOTE — Progress Notes (Signed)
[facility] PROGRESS NOTE  Lucas [name] EAV:[id number] DOB: Dec 28, 1945 DOA: 05/24/2013 PCP: Alva Garnet., MD  Assessment/Plan: 1. Acute on chronic resp failure- 1. Currently on minimal O2 support (uses 2L [place] at home)  2. IV lasix x 1 given in the ED 3. CXR w/o signs of pulm edema 4. Cont nebs as needed 5. Cont steroids and wean as tolerated 6. Was started on empiric Levaquin 7. Pt remains symptomatic with wheezing 2. COPD exacerbation- see above 3. Suspected Acute on chronic combined CHF 1. see above 2. trend BNP 3. Monitor renal fx closely 4. HTN 1. home meds plus PRN hydralazine 2. BP stable and controlled 5. Obesity 1. encourage weight loss 6. Recent tobacco abuse 1. Claims has not smoked since sept, however RN notes suggest pt continues to smoke 7. Anxiety 1. Xanax as needed  Code Status: Full Family Communication: Pt in room (indicate person spoken with, relationship, and if by phone, the number) Disposition Plan: Pending  Antibiotics:  Levaquin 05/24/13>>>  HPI/Subjective: No acute events noted overnight  Objective: Filed Vitals:   05/27/13 1932 05/27/13 2148 05/28/13 0553 05/28/13 0804  BP:  145/76 142/71   Pulse:  75 69   Temp:  97.3 F (36.3 C) 97.7 F (36.5 C)   TempSrc:  Oral Oral   Resp:  20 21   Height:      Weight:   93.123 kg (205 lb 4.8 oz)   SpO2: 96% 97% 93% 91%    Intake/Output Summary (Last 24 hours) at 05/28/13 0902 Last data filed at 05/28/13 0840  Gross per 24 hour  Intake   1440 ml  Output   1625 ml  Net   -185 ml   Filed Weights   05/26/13 0509 05/27/13 0558 05/28/13 0553  Weight: 93.4 kg (205 lb 14.6 oz) 93.35 kg (205 lb 12.8 oz) 93.123 kg (205 lb 4.8 oz)    Exam:   General:  Awake, in nad  Cardiovascular: regular, s1, s2  Respiratory: decreased bs, wheezing throughout, no crackles  Abdomen: soft, nondistended  Musculoskeletal: perfused, no clubbing   Data Reviewed: Basic Metabolic  Panel:  Recent Labs Lab 05/24/13 1640 05/25/13 0235  NA 137 139  K 3.9 4.2  CL 102 101  CO2 23 26  GLUCOSE 121* 179*  BUN 21 25*  CREATININE 1.22 1.37*  CALCIUM 9.9 9.4   Liver Function Tests: No results found for this basename: AST, ALT, ALKPHOS, BILITOT, PROT, ALBUMIN,  in the last 168 hours No results found for this basename: LIPASE, AMYLASE,  in the last 168 hours No results found for this basename: AMMONIA,  in the last 168 hours CBC:  Recent Labs Lab 05/24/13 1640 05/25/13 0235  WBC 8.3 5.9  HGB 14.0 13.8  HCT 42.4 42.8  MCV 90.4 91.6  PLT 210 211   Cardiac Enzymes:  Recent Labs Lab 05/24/13 1640 05/24/13 2056 05/25/13 0235  TROPONINI <0.30 <0.30 <0.30   BNP (last 3 results)  Recent Labs  04/16/13 1027 05/24/13 1633 05/25/13 0235  PROBNP 827.0* 4622.0* 5232.0*   CBG: No results found for this basename: GLUCAP,  in the last 168 hours  No results found for this or any previous visit (from the past 240 hour(s)).   Studies: No results found.  Scheduled Meds: . ipratropium  0.5 mg Nebulization QID   And  . albuterol  2.5 mg Nebulization QID  . aspirin EC  81 mg Oral Daily  . doxazosin  8 mg Oral QHS  .  enoxaparin (LOVENOX) injection  40 mg Subcutaneous Q24H  . furosemide  20 mg Oral Q breakfast  . guaiFENesin  1,200 mg Oral BID  . levofloxacin  750 mg Oral Q24H  . lisinopril  10 mg Oral q morning - 10a  . methylPREDNISolone (SOLU-MEDROL) injection  60 mg Intravenous Q6H  . sodium chloride  3 mL Intravenous Q12H  . sodium chloride  3 mL Intravenous Q12H   Continuous Infusions:   Active Problems:   HYPERTENSION   Obesity   Acute exacerbation of chronic obstructive pulmonary disease (COPD)   Acute on chronic combined systolic and diastolic congestive heart failure, NYHA class 4   SOB (shortness of breath)   Acute-on-chronic respiratory failure  Time spent:  Palmyra Rogacki K  [facility] Pager [phone]. If 7PM-7AM, please  contact night-coverage at [email or web address], password [facility] 05/28/2013, 9:02 AM  LOS: 4 days

## 2013-05-29 DIAGNOSIS — J449 Chronic obstructive pulmonary disease, unspecified: Secondary | ICD-10-CM

## 2013-05-29 DIAGNOSIS — Z9189 Other specified personal risk factors, not elsewhere classified: Secondary | ICD-10-CM

## 2013-05-29 MED ORDER — METHYLPREDNISOLONE SODIUM SUCC 125 MG IJ SOLR
60.0000 mg | Freq: Two times a day (BID) | INTRAMUSCULAR | Status: DC
  Administered 2013-05-29 – 2013-05-30 (×2): 60 mg via INTRAVENOUS
  Filled 2013-05-29 (×4): qty 0.96

## 2013-05-29 NOTE — Progress Notes (Signed)
[facility] PROGRESS NOTE  Lucas [name]:[id number] DOB: 1946/01/01 DOA: 05/24/2013 PCP: Alva Garnet., MD  Assessment/Plan: 1. Acute on chronic resp failure- 1. Currently on minimal O2 support (uses 2L [place] at home)  2. IV lasix x 1 given in the ED 3. CXR w/o signs of pulm edema 4. Cont nebs as needed 5. Wean steroids - of note steroids were recently weaned from q6hrs to q12hrs with marked decompensation the following day. Therefore, would recommend slow wean as tolerated 6. Was started on empiric Levaquin 7. Wheezing improving with better air movement 2. COPD exacerbation- see above 3. Suspected Acute on chronic combined CHF 1. see above 2. trend BNP 3. Monitor renal fx closely 4. HTN 1. home meds plus PRN hydralazine 2. BP stable and controlled 5. Obesity 1. encourage weight loss 6. Recent tobacco abuse 1. Claims has not smoked since sept, however RN notes suggest pt continues to smoke 7. Anxiety 1. Xanax as needed  Code Status: Full Family Communication: Pt in room (indicate person spoken with, relationship, and if by phone, the number) Disposition Plan: Pending  Antibiotics:  Levaquin 05/24/13>>>  HPI/Subjective: No acute events noted overnight. Feels better  Objective: Filed Vitals:   05/28/13 2126 05/29/13 0500 05/29/13 0652 05/29/13 0739  BP: 132/68 152/111 152/101   Pulse: 71     Temp: 97.7 F (36.5 C) 98 F (36.7 C)    TempSrc: Oral Oral    Resp: 26     Height:      Weight:      SpO2: 94% 99%  100%    Intake/Output Summary (Last 24 hours) at 05/29/13 0847 Last data filed at 05/29/13 0751  Gross per 24 hour  Intake   1203 ml  Output   1401 ml  Net   -198 ml   Filed Weights   05/26/13 0509 05/27/13 0558 05/28/13 0553  Weight: 93.4 kg (205 lb 14.6 oz) 93.35 kg (205 lb 12.8 oz) 93.123 kg (205 lb 4.8 oz)    Exam:   General:  Awake, in nad  Cardiovascular: regular, s1, s2  Respiratory: decreased bs, wheezing throughout,  no crackles  Abdomen: soft, nondistended  Musculoskeletal: perfused, no clubbing   Data Reviewed: Basic Metabolic Panel:  Recent Labs Lab 05/24/13 1640 05/25/13 0235  NA 137 139  K 3.9 4.2  CL 102 101  CO2 23 26  GLUCOSE 121* 179*  BUN 21 25*  CREATININE 1.22 1.37*  CALCIUM 9.9 9.4   Liver Function Tests: No results found for this basename: AST, ALT, ALKPHOS, BILITOT, PROT, ALBUMIN,  in the last 168 hours No results found for this basename: LIPASE, AMYLASE,  in the last 168 hours No results found for this basename: AMMONIA,  in the last 168 hours CBC:  Recent Labs Lab 05/24/13 1640 05/25/13 0235  WBC 8.3 5.9  HGB 14.0 13.8  HCT 42.4 42.8  MCV 90.4 91.6  PLT 210 211   Cardiac Enzymes:  Recent Labs Lab 05/24/13 1640 05/24/13 2056 05/25/13 0235  TROPONINI <0.30 <0.30 <0.30   BNP (last 3 results)  Recent Labs  04/16/13 1027 05/24/13 1633 05/25/13 0235  PROBNP 827.0* 4622.0* 5232.0*   CBG: No results found for this basename: GLUCAP,  in the last 168 hours  No results found for this or any previous visit (from the past 240 hour(s)).   Studies: No results found.  Scheduled Meds: . ipratropium  0.5 mg Nebulization QID   And  . albuterol  2.5 mg Nebulization QID  .  aspirin EC  81 mg Oral Daily  . doxazosin  8 mg Oral QHS  . enoxaparin (LOVENOX) injection  40 mg Subcutaneous Q24H  . furosemide  20 mg Oral Q breakfast  . guaiFENesin  1,200 mg Oral BID  . levofloxacin  750 mg Oral Q24H  . lisinopril  10 mg Oral q morning - 10a  . methylPREDNISolone (SOLU-MEDROL) injection  60 mg Intravenous Q8H  . sodium chloride  3 mL Intravenous Q12H  . sodium chloride  3 mL Intravenous Q12H   Continuous Infusions:   Active Problems:   HYPERTENSION   Obesity   Acute exacerbation of chronic obstructive pulmonary disease (COPD)   Acute on chronic combined systolic and diastolic congestive heart failure, NYHA class 4   SOB (shortness of breath)    Acute-on-chronic respiratory failure  Time spent:  CHIU, STEPHEN K  [facility] Pager [phone]. If 7PM-7AM, please contact night-coverage at [email or web address], password [facility] 05/29/2013, 8:47 AM  LOS: 5 days

## 2013-05-29 NOTE — Progress Notes (Signed)
Patient was recently followed by [facility] Care Management services. He declined visits and [facility] Care Management followup as he reported that he was caring for his sister at the time. Letter was also sent to PCP to make aware of patient's declination of services for [facility] Care Management. Will attempt to engage patient again to see if he is interested in services. If he continues to decline [facility] Care Management will phone PCP office to make aware and request that PCP office possibly speak with patient regarding [facility] Care Management. Will discuss with inpatient RNCM after speaking patient.  [name], MSN-Ed, RN,BSN- [facility] [phone]

## 2013-05-29 NOTE — Progress Notes (Signed)
Came to visit Mr Lucas [name] at bedside to see if he wanted to be followed by [facility] Care Management services and if he will allow the [facility] Care Coordinator to make visits if needed. He states that he will like to be followed by [facility] Care Management for disease management. He reports his sister is just "as bad off as me". States he and his sister assist one another and if she needs him he goes to help her. He reports that is why he declined services in the past. Consents signed again and confirmed best contact information and left a [facility] Care Management packet at bedside. Will make inpatient RNCM aware of visit. [name], MSN-Ed, RN,BSN- [facility] [phone]

## 2013-05-30 LAB — BASIC METABOLIC PANEL
BUN: 35 mg/dL — ABNORMAL HIGH (ref 6–23)
CO2: 31 mEq/L (ref 19–32)
Calcium: 9 mg/dL (ref 8.4–10.5)
Chloride: 97 mEq/L (ref 96–112)
GFR calc Af Amer: 65 mL/min — ABNORMAL LOW (ref >90)
Glucose, Bld: 149 mg/dL — ABNORMAL HIGH (ref 70–99)
Potassium: 4.4 mEq/L (ref 3.5–5.1)
Sodium: 137 mEq/L (ref 135–145)

## 2013-05-30 MED ORDER — PREDNISONE 50 MG PO TABS
60.0000 mg | ORAL_TABLET | Freq: Two times a day (BID) | ORAL | Status: DC
  Administered 2013-05-30: 60 mg via ORAL
  Filled 2013-05-30 (×2): qty 1

## 2013-05-30 MED ORDER — PREDNISONE 20 MG PO TABS: 60.0000 mg | ORAL_TABLET | Freq: Two times a day (BID) | ORAL | Status: DC

## 2013-05-30 MED ORDER — LEVOFLOXACIN 750 MG PO TABS: 750.0000 mg | ORAL_TABLET | ORAL | Status: DC

## 2013-05-30 NOTE — Discharge Summary (Signed)
Physician Discharge Summary  Lucas [name] WGN:[id number] DOB: 1946-03-10 DOA: 05/24/2013  PCP: Alva Garnet., MD  Admit date: 05/24/2013 Discharge date: 05/30/2013  Time spent: 65 minutes  Recommendations for Outpatient Follow-up:  1. Followup with Alva Garnet., MD one week post discharge. On followup basic metabolic profile will need to be obtained to followup on electrolytes and renal function. Patient's CHF will need to be reassessed at that time. 2. Patient is to followup with Rubye Oaks, [place] pulmonary on 06/12/2013 at 4 PM, for hospital followup on his acute COPD exacerbation.  Discharge Diagnoses:  Principal Problem:   Acute-on-chronic respiratory failure Active Problems:   HYPERTENSION   CKD (chronic kidney disease) stage 1, GFR 90 ml/min or greater   Obesity   Acute exacerbation of chronic obstructive pulmonary disease (COPD)   Acute on chronic combined systolic and diastolic congestive heart failure, NYHA class 4   SOB (shortness of breath)   Discharge Condition: Stable and improved  Diet recommendation: Heart healthy  Filed Weights   05/28/13 0553 05/29/13 1834 05/30/13 0556  Weight: 93.123 kg (205 lb 4.8 oz) 95.6 kg (210 lb 12.2 oz) 94.1 kg (207 lb 7.3 oz)    History of present illness:  Lucas [name] is a 67 y.o. male  With multiple medical issues brought in by EMS with SOB- that began today. He has COPD/CHF that he wears O2 occasionally for. When EMS arrived, he was wheezing, O2 sats on 2L were in the 80s. He improved when EMS gave him albuterol. This AM he also had an episode of chest pain. This has since resolved. He did have sweating at the time of chest pain.  + leg swelling- decreased with lasix given in ER.  He reports he has not smoked since his last hospitalization  In the ER, his BNP was elevated and was given lasix with some improvement. He was also given albuterol with improvement- he was weaned from 6L to 4L.       Hospital Course:  #1 acute on chronic respiratory failure Patient had presented with worsening shortness of breath on chronic oxygen at home presented with wheezing oxygen sats were 80% on 2 L. He was given some albuterol. EMS also noted to have lower extremity swelling and also given some IV Lasix. BNP which was obtained was elevated. Patient was admitted to telemetry was placed on oxygen, started on nebulizer treatments, IV steroids and Levaquin. Patient was also placed on some IV diuretics. Patient was monitored and followed. Patient improved slowly during the hospitalization chest x-ray which was done was negative for pulmonary edema. Patient's steroids were slowly weaned and he was subsequently transitioned to oral Levaquin. Patient's IV Lasix was subsequently transitioned to oral Lasix. Patient improved clinically and was back to baseline by day of discharge. Patient be discharged on a slow steroid taper, while more days of oral Levaquin to complete a one-week course of antibiotic therapy. Patient will followup with pulmonary and PCP as outpatient. Patient was discharged in stable and improved condition.  #2 acute COPD exacerbation On admission patient was noted to be in acute COPD exacerbation contributing to his acute on chronic respiratory failure. Patient was placed on IV Solu-Medrol, nebulizer treatments, IV Levaquin. Patient improved clinically IV steroids were slowly tapered and IV Levaquin was transitioned to oral Levaquin. Patient be discharged home in stable and improved condition to followup with pulmonary as outpatient.  #3 acute on chronic CHF exacerbation On admission patient was noted to be in acute on chronic  CHF exacerbation with elevated BNP of 4622. Cardiac enzymes which was cycled were negative x3. Patient had a recent 2-D echo done 04/04/2013 which showed a EF of 50-55%. LVH. Mild aortic valvular stenosis. Patient was placed on IV Lasixin the and diuresis adequately.  Patient was -2.47 L during this hospitalization. Patient's weight on day of discharge was 94.1 kg(207 pounds) which is close to his dry weight. Patient be discharged in stable and improved condition.  #4 hypertension Remained stable. Patient was kept on his home regimen.  The rest of patient's chronic medical issues remained stable throughout the hospitalization the patient be discharged in stable and improved condition.  Procedures:  CXR 05/24/13  Consultations:  None  Discharge Exam: Filed Vitals:   05/30/13 1400  BP: 118/69  Pulse: 71  Temp: 97.3 F (36.3 C)  Resp: 15    General: NAD Cardiovascular: RRR Respiratory: CTAB  Discharge Instructions      Discharge Orders   Future Appointments Provider Department Dept Phone   06/12/2013 4:00 PM Julio Sicks, NP [facility] [phone]   06/13/2013 10:45 AM Oretha Milch, MD [facility] [phone]   Future Orders Complete By Expires   Diet - low sodium heart healthy  As directed    Discharge instructions  As directed    Comments:     Follow up with Jeanmarie Plant, [place] [facility] on 06/12/13 at 4pm Follow up with Alva Garnet., MD in 1 week.   Increase activity slowly  As directed        Medication List    STOP taking these medications       isosorbide mononitrate 30 MG 24 hr tablet  Commonly known as:  IMDUR      TAKE these medications       albuterol (2.5 MG/3ML) 0.083% nebulizer solution  Commonly known as:  PROVENTIL  Take 2.5 mg by nebulization 4 (four) times daily.     albuterol 108 (90 BASE) MCG/ACT inhaler  Commonly known as:  PROVENTIL HFA;VENTOLIN HFA  Inhale 2 puffs into the lungs every 6 (six) hours as needed. Shortness of breath     ALPRAZolam 1 MG tablet  Commonly known as:  XANAX  Take 1 tablet (1 mg total) by mouth 3 (three) times daily as needed for sleep.     aspirin 81 MG EC tablet  Take 1 tablet (81 mg total) by mouth daily.     doxazosin 8 MG  tablet  Commonly known as:  CARDURA  Take 8 mg by mouth at bedtime.     furosemide 40 MG tablet  Commonly known as:  LASIX  Take 0.5 tablets (20 mg total) by mouth daily.     HYDROcodone-acetaminophen 10-325 MG per tablet  Commonly known as:  NORCO  Take 1 tablet by mouth every 6 (six) hours as needed. For pain.     ipratropium 0.02 % nebulizer solution  Commonly known as:  ATROVENT  Take 2.5 mLs (0.5 mg total) by nebulization 4 (four) times daily.     levofloxacin 750 MG tablet  Commonly known as:  LEVAQUIN  Take 1 tablet (750 mg total) by mouth daily.  Start taking on:  05/31/2013     lisinopril 10 MG tablet  Commonly known as:  PRINIVIL,ZESTRIL  Take 10 mg by mouth every morning.     predniSONE 20 MG tablet  Commonly known as:  DELTASONE  Take 3 tablets (60 mg total) by mouth 2 (two) times daily with a meal.  Take 3 tablets (60mg ) 2 times daily x 2 days, then 3 tablets(60mg ) daily x 3 days, then 2 tablets (40mg )x 3 days, then 1 tablet (20mg ) daily until seen by pulmonary doctor.     rosuvastatin 20 MG tablet  Commonly known as:  CRESTOR  Take 20 mg by mouth daily.       Allergies  Allergen Reactions  . Ativan [Lorazepam] Other (See Comments)    Increases agitation, tolerates xanax  . Morphine And Related     Hallucinations, too sedated when given with ativan  . Ibuprofen Rash    Tolerates aspirin per patient.     Follow-up Information   Follow up with Alva Garnet., MD. Schedule an appointment as soon as possible for a visit in 1 week.   Specialty:  Internal Medicine   Contact information:   [address] [place] [address] [phone]       Follow up with PARRETT,TAMMY, NP On 06/12/2013. (f/u at 4pm)    Specialty:  Nurse Practitioner   Contact information:   [address] [place] [address] [phone]        The results of significant diagnostics from this hospitalization (including imaging, microbiology, ancillary and  laboratory) are listed below for reference.    Significant Diagnostic Studies: Dg Chest Port 1 View  05/24/2013   CLINICAL DATA:  Shortness of breath, hypoxia  EXAM: PORTABLE CHEST - 1 VIEW  COMPARISON:  04/03/2013  FINDINGS: Cardiomediastinal silhouette is stable. Mild interstitial prominence bilaterally without pulmonary edema. No segmental infiltrate status post CABG again noted.  IMPRESSION: No segmental infiltrate. Mild interstitial prominence without pulmonary edema. Status post CABG.   Electronically Signed   By: Natasha Mead M.D.   On: 05/24/2013 16:50    Microbiology: No results found for this or any previous visit (from the past 240 hour(s)).   Labs: Basic Metabolic Panel:  Recent Labs Lab 05/24/13 1640 05/25/13 0235 05/30/13 1137  NA 137 139 137  K 3.9 4.2 4.4  CL 102 101 97  CO2 23 26 31   GLUCOSE 121* 179* 149*  BUN 21 25* 35*  CREATININE 1.22 1.37* 1.28  CALCIUM 9.9 9.4 9.0   Liver Function Tests: No results found for this basename: AST, ALT, ALKPHOS, BILITOT, PROT, ALBUMIN,  in the last 168 hours No results found for this basename: LIPASE, AMYLASE,  in the last 168 hours No results found for this basename: AMMONIA,  in the last 168 hours CBC:  Recent Labs Lab 05/24/13 1640 05/25/13 0235  WBC 8.3 5.9  HGB 14.0 13.8  HCT 42.4 42.8  MCV 90.4 91.6  PLT 210 211   Cardiac Enzymes:  Recent Labs Lab 05/24/13 1640 05/24/13 2056 05/25/13 0235  TROPONINI <0.30 <0.30 <0.30   BNP: BNP (last 3 results)  Recent Labs  04/16/13 1027 05/24/13 1633 05/25/13 0235  PROBNP 827.0* 4622.0* 5232.0*   CBG: No results found for this basename: GLUCAP,  in the last 168 hours     Signed:  Zabella Wease  [facility] 05/30/2013, 3:16 PM

## 2013-06-12 ENCOUNTER — Inpatient Hospital Stay: Payer: Medicare Other | Admitting: Adult Health

## 2013-06-13 ENCOUNTER — Ambulatory Visit (INDEPENDENT_AMBULATORY_CARE_PROVIDER_SITE_OTHER): Payer: Medicare Other | Admitting: Pulmonary Disease

## 2013-06-13 ENCOUNTER — Encounter: Payer: Self-pay | Admitting: Pulmonary Disease

## 2013-06-13 VITALS — BP 140/72 | HR 83 | Temp 98.0°F | Ht 70.0 in | Wt 206.4 lb

## 2013-06-13 DIAGNOSIS — G4733 Obstructive sleep apnea (adult) (pediatric): Secondary | ICD-10-CM

## 2013-06-13 DIAGNOSIS — J449 Chronic obstructive pulmonary disease, unspecified: Secondary | ICD-10-CM

## 2013-06-13 NOTE — Assessment & Plan Note (Signed)
STOP lisinopril - this may be causing cough Take valsartan 50 mg daily instead Pulm rehab has been approved

## 2013-06-13 NOTE — Patient Instructions (Signed)
STOP lisinopril - this may be causing cough Take valsartan 50 mg daily instead  Trial of melatonin 5 mg at 8 pm daily Sleep study will be scheduled Pulm rehab has been approved

## 2013-06-13 NOTE — Assessment & Plan Note (Signed)
Trial of melatonin 5 mg at 8 pm daily - he does seem to have a component of insomnia which may be behavioral related to his wife's dementia Sleep study will be scheduled  Given excessive daytime somnolence, narrow pharyngeal exam, witnessed apneas & loud snoring, obstructive sleep apnea is very likely & an overnight polysomnogram will be scheduled as a split study. The pathophysiology of obstructive sleep apnea , it's cardiovascular consequences & modes of treatment including CPAP were discused with the patient in detail & they evidenced understanding.

## 2013-06-13 NOTE — Progress Notes (Signed)
Subjective:    Patient ID: Lucas [name], male    DOB: 1945/10/28, 67 y.o.   MRN: [id number]  HPI 67 year old smoker referred for evaluation of insomnia and sleep apnea. He has COPD Gold stage II in setting of Obesity, CAD with Systolic CHF and hx of Prostate CAncer. Epworth sleepiness score is 5/24. His wife had dementia and he took care of her for last 5 years prior to her demise. He states that he' trained his mind' to be alert to her movements. He now reports difficulty maintaining his sleep and frequent nocturnal awakenings. Ambien made him hallucinate, he takes Xanax 1 mg 3 times a day evening dose at 6 PM. He uses 2 L of oxygen during sleep for the last 3 months. Bedtime is around 10 PM, sleep latency is variable up to an hour, he sleeps on his side with 3 pillows due to back and neck pain. He is 4-5 nocturnal awakenings without any post void sleep latency and is out of bed sometimes as early as 2:30 AM with occasional dryness of mouth but he denies headaches. He does not feel refreshed in often naps during the day. His weight has not changed over the last 2 years. Loud snoring has been noted by family members There is no history suggestive of cataplexy, sleep paralysis or parasomnias  He reports a dry cough and medication review shows lisinopril.    Past Medical History  Diagnosis Date  . CAD (coronary artery disease)   . Hypertension   . Anxiety   . History of kidney cancer   . ADENOCARCINOMA, PROSTATE   . Adjustment disorder with anxiety   . Hyperlipidemia   . BPH (benign prostatic hypertrophy)   . COPD (chronic obstructive pulmonary disease)   . CAD (coronary artery disease)   . Myocardial infarction   . Shortness of breath   . Arthritis     Past Surgical History  Procedure Laterality Date  . Coronary artery bypass graft      x 5  . Cardiac catheterization    . Nephrectomy      left nephrectomy for ca  . Posterior cervical laminectomy      x 8   limited ROM   and can't lie flat  . Heart stents      x 5  . Robot assisted laparoscopic radical prostatectomy      for prostate cancer  . Cystoscopy with litholapaxy  05/01/2012    Procedure: CYSTOSCOPY WITH LITHOLAPAXY;  Surgeon: Crecencio Mc, MD;  Location: [facility];  Service: Urology;  Laterality: N/A;    Allergies  Allergen Reactions  . Ativan [Lorazepam] Other (See Comments)    Increases agitation, tolerates xanax  . Morphine And Related     Hallucinations, too sedated when given with ativan  . Ibuprofen Rash    Tolerates aspirin per patient.      History   Social History  . Marital Status: Widowed    Spouse Name: N/A    Number of Children: N/A  . Years of Education: N/A   Occupational History  . disabled    Social History Main Topics  . Smoking status: Former Smoker -- 0.30 packs/day for 54 years    Types: Cigarettes    Quit date: 03/29/2013  . Smokeless tobacco: Former Neurosurgeon    Quit date: 04/19/2012     Comment: Using e-cig now  . Alcohol Use: No  . Drug Use: No  . Sexual Activity: Not on file  Other Topics Concern  . Not on file   Social History Narrative    He lives in [place] by himself.  He is a widower.    He continues to smoke about 4 or 5  cigarettes a day but has a greater than 100 pack-year history having  previously smoked up to 3-4 packs a day over the span about 48 years.     He denies alcohol or drug use.  He is retired secondary to disability     since the 28s.     Family History  Problem Relation Age of Onset  . Diabetes Mother   . Coronary artery disease    . Heart disease Mother   . Heart disease Father   . Heart disease Brother   . Diabetes Father      Review of Systems  Constitutional: Negative for fever and unexpected weight change.  HENT: Negative for congestion, dental problem, ear pain, nosebleeds, postnasal drip, rhinorrhea, sinus pressure, sneezing, sore throat and trouble swallowing.   Eyes: Negative for redness and itching.   Respiratory: Positive for shortness of breath. Negative for cough, chest tightness and wheezing.   Cardiovascular: Negative for palpitations and leg swelling.  Gastrointestinal: Negative for nausea and vomiting.  Genitourinary: Negative for dysuria.  Musculoskeletal: Negative for joint swelling.  Skin: Negative for rash.  Neurological: Negative for headaches.  Hematological: Does not bruise/bleed easily.  Psychiatric/Behavioral: Negative for dysphoric mood. The patient is not nervous/anxious.        Objective:   Physical Exam  Gen. Pleasant, obese, in no distress, normal affect ENT - no lesions, no post nasal drip, class 2-3 airway Neck: No JVD, no thyromegaly, no carotid bruits Lungs: no use of accessory muscles, no dullness to percussion, decreased without rales or rhonchi  Cardiovascular: Rhythm regular, heart sounds  normal, no murmurs or gallops, no peripheral edema Abdomen: soft and non-tender, no hepatosplenomegaly, BS normal. Musculoskeletal: No deformities, no cyanosis or clubbing Neuro:  alert, non focal, no tremors        Assessment & Plan:

## 2013-06-15 ENCOUNTER — Telehealth: Payer: Self-pay | Admitting: Pulmonary Disease

## 2013-06-15 ENCOUNTER — Inpatient Hospital Stay: Payer: PRIVATE HEALTH INSURANCE | Admitting: Adult Health

## 2013-06-15 MED ORDER — VALSARTAN 80 MG PO TABS: 80.0000 mg | ORAL_TABLET | Freq: Every day | ORAL | Status: DC

## 2013-06-15 NOTE — Telephone Encounter (Signed)
Was suppose to stop ACE  D/c lisinopril . (please take off MAR)  Begin Valsartan (Diovan ) 80mg  #30 , 1 daily , 5 refills.  follow up with PCP for b/p after this  Keep follow up in 3-4 weeks with Dr. Marchelle Gearing  Would avoid ACE inhibitors -place on allergy list please (cough as allergy)  Please contact office for sooner follow up if symptoms do not improve or worsen or seek emergency care

## 2013-06-15 NOTE — Telephone Encounter (Signed)
Pt seen by RA on 12.3.14 w/ new start Valsartan 50mg  No rx was sent to pharmacy -- does Valsartan come in 50mg ?  TP please advise, thank you.

## 2013-06-15 NOTE — Telephone Encounter (Signed)
Called spoke with patient, discussed the below with him Valsartan 80mg  sent to verified pharmacy Pt to keep 1.8.15 ov w/ MR ACE inhibitors added to allergy list Nothing further needed at this time; will sign off.

## 2013-06-18 ENCOUNTER — Encounter (HOSPITAL_COMMUNITY)
Admission: RE | Admit: 2013-06-18 | Discharge: 2013-06-18 | Disposition: A | Discharge status: Home / Self Care | Payer: PRIVATE HEALTH INSURANCE | Source: Ambulatory Visit | Attending: Internal Medicine | Admitting: Internal Medicine

## 2013-06-18 DIAGNOSIS — J4489 Other specified chronic obstructive pulmonary disease: Secondary | ICD-10-CM | POA: Insufficient documentation

## 2013-06-18 DIAGNOSIS — I359 Nonrheumatic aortic valve disorder, unspecified: Secondary | ICD-10-CM | POA: Insufficient documentation

## 2013-06-18 DIAGNOSIS — N181 Chronic kidney disease, stage 1: Secondary | ICD-10-CM | POA: Insufficient documentation

## 2013-06-18 DIAGNOSIS — I2510 Atherosclerotic heart disease of native coronary artery without angina pectoris: Secondary | ICD-10-CM | POA: Insufficient documentation

## 2013-06-18 DIAGNOSIS — J449 Chronic obstructive pulmonary disease, unspecified: Secondary | ICD-10-CM | POA: Insufficient documentation

## 2013-06-18 DIAGNOSIS — Z85528 Personal history of other malignant neoplasm of kidney: Secondary | ICD-10-CM | POA: Insufficient documentation

## 2013-06-18 DIAGNOSIS — I5042 Chronic combined systolic (congestive) and diastolic (congestive) heart failure: Secondary | ICD-10-CM | POA: Insufficient documentation

## 2013-06-18 DIAGNOSIS — I509 Heart failure, unspecified: Secondary | ICD-10-CM | POA: Insufficient documentation

## 2013-06-18 DIAGNOSIS — Z8249 Family history of ischemic heart disease and other diseases of the circulatory system: Secondary | ICD-10-CM | POA: Insufficient documentation

## 2013-06-18 DIAGNOSIS — I129 Hypertensive chronic kidney disease with stage 1 through stage 4 chronic kidney disease, or unspecified chronic kidney disease: Secondary | ICD-10-CM | POA: Insufficient documentation

## 2013-06-18 DIAGNOSIS — Z5189 Encounter for other specified aftercare: Secondary | ICD-10-CM | POA: Insufficient documentation

## 2013-06-18 DIAGNOSIS — Z9981 Dependence on supplemental oxygen: Secondary | ICD-10-CM | POA: Insufficient documentation

## 2013-06-18 DIAGNOSIS — Z87891 Personal history of nicotine dependence: Secondary | ICD-10-CM | POA: Insufficient documentation

## 2013-06-18 DIAGNOSIS — I252 Old myocardial infarction: Secondary | ICD-10-CM | POA: Insufficient documentation

## 2013-06-18 DIAGNOSIS — E785 Hyperlipidemia, unspecified: Secondary | ICD-10-CM | POA: Insufficient documentation

## 2013-06-18 DIAGNOSIS — Z951 Presence of aortocoronary bypass graft: Secondary | ICD-10-CM | POA: Insufficient documentation

## 2013-06-18 NOTE — Progress Notes (Signed)
Lucas [name] 67 y.o. male Pulmonary Rehab Orientation Note Patient arrived today in Cardiac [facility] Rehab for orientation to Pulmonary Rehab. He walked from the emergency department parking lot and arrived in no acute distress.  He did not bring his portable oxygen today, but does have it.  Per pt, he uses oxygen frequently when he walks long distances.  Color good, skin warm and dry. Patient is oriented to time and place. Patient's medical history and medications reviewed. Heart rate is normal, breath sounds clear to auscultation, no wheezes, rales, or rhonchi. Grip strength equal, strong. Distal pulses 1+ with posterior tibial pulses.  Patient reports he does take medications as prescribed. Patient states he follows a Regular diet.  The patient reports no specific efforts to gain or lose weight.  Patient's weight will be monitored closely. Demonstration and practice of PLB using pulse oximeter. Patient able to return demonstration satisfactorily. Safety and hand hygiene in the exercise area reviewed with patient. Patient voices understanding of the information reviewed. Department expectations discussed with patient and achievable goals were set. The patient shows enthusiasm about attending the program and we look forward to working with this nice gentleman. The patient is scheduled for a 6 min walk test on Tuesday, June 19, 2013 @ 3:30pm and to begin exercise on Thursday, June 21, 2013 @ 1:30 pm.   1200-1400

## 2013-06-18 NOTE — Progress Notes (Signed)
    Lucas [name] 67 y.o. male  Initial Psychosocial Assessment  Pt psychosocial assessment reveals pt lives alone, his wife died about 2 years ago and he has been very lonly since her death. Pt is currently unemployed, disabled because of multiple neck and back surgeries. Pt has no hobbies.  He used to be a member of a walking club, but his COPD has kept him from participating.   Pt reports his stress level is moderate. Areas of stress/anxiety include his health and family.  In the last 2 years he has lost his wife, 2 brothers, and 1 sister.   Pt does  exhibit signs of depression. Signs of depression include anxiety and difficulty maintaining sleep.  His health has been a slow steady decline over the last 10 years.   Pt shows good  coping skills with positive outlook .  Offered emotional support and reassurance. Monitor and evaluate progress toward psychosocial goal(s).  Goal(s): To improve deconditioning To be able to walk for exercise without being so short of breath Be able to purse-lip breathe and demonstrate Help patient work toward returning to meaningful activities that improve patient's QOL and are attainable with patient's lung disease   06/18/2013 1:39 PM

## 2013-06-19 ENCOUNTER — Encounter (HOSPITAL_COMMUNITY): Payer: Self-pay

## 2013-06-19 ENCOUNTER — Encounter (HOSPITAL_COMMUNITY)
Admission: RE | Admit: 2013-06-19 | Discharge: 2013-06-19 | Disposition: A | Discharge status: Home / Self Care | Payer: PRIVATE HEALTH INSURANCE | Source: Ambulatory Visit | Attending: Internal Medicine | Admitting: Internal Medicine

## 2013-06-19 NOTE — Progress Notes (Signed)
[name] completed a Six-Minute Walk Test on 06/19/13 . [name] walked 901 feet with 0 breaks.  The patient's lowest oxygen saturation was 95% , highest heart rate was 123 , and highest blood pressure was 190/88.  After 2 minutes his blood pressure came down to 170/88.  After sitting and talking for a few minutes his blood pressure came down to 142/88. The patient was on 2 liters. [name] stated that burning in his legs hindered his walk test.

## 2013-06-28 ENCOUNTER — Ambulatory Visit (INDEPENDENT_AMBULATORY_CARE_PROVIDER_SITE_OTHER): Payer: Medicaid Other | Admitting: Cardiovascular Disease

## 2013-06-28 ENCOUNTER — Encounter: Payer: Self-pay | Admitting: Cardiovascular Disease

## 2013-06-28 VITALS — BP 133/77 | HR 84 | Ht 70.0 in | Wt 206.2 lb

## 2013-06-28 DIAGNOSIS — I10 Essential (primary) hypertension: Secondary | ICD-10-CM

## 2013-06-28 DIAGNOSIS — R634 Abnormal weight loss: Secondary | ICD-10-CM

## 2013-06-28 DIAGNOSIS — I2510 Atherosclerotic heart disease of native coronary artery without angina pectoris: Secondary | ICD-10-CM

## 2013-06-28 DIAGNOSIS — E119 Type 2 diabetes mellitus without complications: Secondary | ICD-10-CM

## 2013-06-28 DIAGNOSIS — R7303 Prediabetes: Secondary | ICD-10-CM | POA: Insufficient documentation

## 2013-06-28 NOTE — Assessment & Plan Note (Signed)
Well controlled.  Continue current medications and low sodium Dash type diet.    

## 2013-06-28 NOTE — Progress Notes (Signed)
Patient ID: Lucas [name], male   DOB: Jul 15, 1945, 67 y.o.   MRN: [id number] 67 yo recently hospitalized with respitory failure. Significant COPD on home oxygen and still smoking some. Rx with steroids and antibiotics Echo with EF 50-55% diuresed for component of "diatiolic" heart failure BNP was elevated at 7888 History of CAD stable. R/O  Cath 04/21/12  Left ventriculography: Left ventricular systolic function is mildly reduced. Assessment is somewhat complicated by PVC beats, but the inferior wall appears hypokinetic. The other segments appear normal. I would estimate the left ventricular ejection fraction at 45%.  Final Conclusions:  1. Severe three-vessel coronary artery disease  2. 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  3. Moderate LV dysfunction as above  4. Normal intracardiac hemodynamics  Has a new car Lucas [name] is is trying not to smoke in it  Going to pulmonary rehab  Has not smoked in 2.5 months  ? Of DM with A1c in mid 6 range Needs to see a nutritionist and f/u with Dr Kellie Shropshire    ROS: Denies fever, malais, weight loss, blurry vision, decreased visual acuity, cough, sputum, SOB, hemoptysis, pleuritic pain, palpitaitons, heartburn, abdominal pain, melena, lower extremity edema, claudication, or rash.  All other systems reviewed and negative  General: Affect appropriate Overweight white male  HEENT: normal Neck supple with no adenopathy JVP normal no bruits no thyromegaly Lungs Expitory wheezing and good diaphragmatic motion Heart:  S1/S2 no murmur, no rub, gallop or click PMI normal Abdomen: benighn, BS positve, no tenderness, no AAA no bruit.  No HSM or HJR Distal pulses intact with no bruits No edema Neuro non-focal Skin warm and dry No muscular weakness   Current Outpatient Prescriptions  Medication Sig Dispense Refill  .  albuterol (PROVENTIL HFA;VENTOLIN HFA) 108 (90 BASE) MCG/ACT inhaler Inhale 2 puffs into the lungs every 6 (six) hours as needed. Shortness of breath      . albuterol (PROVENTIL) (2.5 MG/3ML) 0.083% nebulizer solution Take 2.5 mg by nebulization 4 (four) times daily.      Marland Kitchen ALPRAZolam (XANAX) 1 MG tablet Take 1 tablet (1 mg total) by mouth 3 (three) times daily as needed for sleep.      Marland Kitchen aspirin EC 81 MG EC tablet Take 1 tablet (81 mg total) by mouth daily.      Marland Kitchen doxazosin (CARDURA) 8 MG tablet Take 8 mg by mouth at bedtime.       . furosemide (LASIX) 40 MG tablet Take 0.5 tablets (20 mg total) by mouth daily.      Marland Kitchen HYDROcodone-acetaminophen (NORCO) 10-325 MG per tablet Take 1 tablet by mouth every 6 (six) hours as needed. For pain.      Marland Kitchen ipratropium (ATROVENT) 0.02 % nebulizer solution Take 2.5 mLs (0.5 mg total) by nebulization 4 (four) times daily.  75 mL  12  . NITROSTAT 0.3 MG SL tablet       . valsartan (DIOVAN) 80 MG tablet Take 1 tablet (80 mg total) by mouth daily.  30 tablet  5   No current facility-administered medications for this visit.    Allergies  Ace inhibitors; Ativan; Morphine and related; and Ibuprofen  Electrocardiogram:  SR rate PAC  Rate 64    Assessment and Plan

## 2013-06-28 NOTE — Assessment & Plan Note (Signed)
Much improved  Less visits to ER and mood much more stable

## 2013-06-28 NOTE — Assessment & Plan Note (Signed)
Stable with no angina and good activity level.  Continue medical Rx  

## 2013-06-28 NOTE — Assessment & Plan Note (Signed)
Long discussion with Lucas [name] regarding low carb diet and monitoring  BS recently elevated in hospital due to iv steroids.  F/U Dr Renae Gloss for A1c  Should get home glucose monitor Suspect he will need glucophage in future if renal function is ok

## 2013-06-28 NOTE — Patient Instructions (Signed)
Your physician wants you to follow-up in:   6 MONTHS WITH  DR Haywood Filler will receive a reminder letter in the mail two months in advance. If you don't receive a letter, please call our office to schedule the follow-up appointment.  You have been referred to [facility]UTR[facility]  FOR  DIABETES AND WEIGHT LOSS

## 2013-06-28 NOTE — Assessment & Plan Note (Signed)
Improved post hospital d/c Stopped smoking Try to avoid chronic oral steroids given borderline DM

## 2013-06-30 ENCOUNTER — Emergency Department (HOSPITAL_COMMUNITY): Payer: PRIVATE HEALTH INSURANCE

## 2013-06-30 ENCOUNTER — Encounter (HOSPITAL_COMMUNITY): Payer: Self-pay | Admitting: Emergency Medicine

## 2013-06-30 ENCOUNTER — Inpatient Hospital Stay (HOSPITAL_COMMUNITY)
Admission: EM | Admit: 2013-06-30 | Discharge: 2013-07-02 | DRG: 311 | Disposition: A | Discharge status: Home / Self Care | Payer: PRIVATE HEALTH INSURANCE | Attending: Cardiology | Admitting: Cardiology

## 2013-06-30 DIAGNOSIS — I509 Heart failure, unspecified: Secondary | ICD-10-CM | POA: Diagnosis present

## 2013-06-30 DIAGNOSIS — I255 Ischemic cardiomyopathy: Secondary | ICD-10-CM

## 2013-06-30 DIAGNOSIS — Z8249 Family history of ischemic heart disease and other diseases of the circulatory system: Secondary | ICD-10-CM

## 2013-06-30 DIAGNOSIS — N390 Urinary tract infection, site not specified: Secondary | ICD-10-CM

## 2013-06-30 DIAGNOSIS — Z87891 Personal history of nicotine dependence: Secondary | ICD-10-CM

## 2013-06-30 DIAGNOSIS — I2510 Atherosclerotic heart disease of native coronary artery without angina pectoris: Secondary | ICD-10-CM | POA: Diagnosis present

## 2013-06-30 DIAGNOSIS — Z833 Family history of diabetes mellitus: Secondary | ICD-10-CM

## 2013-06-30 DIAGNOSIS — E785 Hyperlipidemia, unspecified: Secondary | ICD-10-CM | POA: Diagnosis present

## 2013-06-30 DIAGNOSIS — I2589 Other forms of chronic ischemic heart disease: Secondary | ICD-10-CM | POA: Diagnosis present

## 2013-06-30 DIAGNOSIS — C61 Malignant neoplasm of prostate: Secondary | ICD-10-CM

## 2013-06-30 DIAGNOSIS — I252 Old myocardial infarction: Secondary | ICD-10-CM

## 2013-06-30 DIAGNOSIS — I10 Essential (primary) hypertension: Secondary | ICD-10-CM | POA: Diagnosis present

## 2013-06-30 DIAGNOSIS — I5022 Chronic systolic (congestive) heart failure: Secondary | ICD-10-CM

## 2013-06-30 DIAGNOSIS — Z85528 Personal history of other malignant neoplasm of kidney: Secondary | ICD-10-CM

## 2013-06-30 DIAGNOSIS — Z951 Presence of aortocoronary bypass graft: Secondary | ICD-10-CM

## 2013-06-30 DIAGNOSIS — I209 Angina pectoris, unspecified: Secondary | ICD-10-CM

## 2013-06-30 DIAGNOSIS — I5043 Acute on chronic combined systolic (congestive) and diastolic (congestive) heart failure: Secondary | ICD-10-CM

## 2013-06-30 DIAGNOSIS — Z9981 Dependence on supplemental oxygen: Secondary | ICD-10-CM

## 2013-06-30 DIAGNOSIS — J9620 Acute and chronic respiratory failure, unspecified whether with hypoxia or hypercapnia: Secondary | ICD-10-CM

## 2013-06-30 DIAGNOSIS — J4489 Other specified chronic obstructive pulmonary disease: Secondary | ICD-10-CM | POA: Diagnosis present

## 2013-06-30 DIAGNOSIS — F17200 Nicotine dependence, unspecified, uncomplicated: Secondary | ICD-10-CM

## 2013-06-30 DIAGNOSIS — N181 Chronic kidney disease, stage 1: Secondary | ICD-10-CM

## 2013-06-30 DIAGNOSIS — Z6831 Body mass index (BMI) 31.0-31.9, adult: Secondary | ICD-10-CM

## 2013-06-30 DIAGNOSIS — R300 Dysuria: Secondary | ICD-10-CM

## 2013-06-30 DIAGNOSIS — G4733 Obstructive sleep apnea (adult) (pediatric): Secondary | ICD-10-CM

## 2013-06-30 DIAGNOSIS — E669 Obesity, unspecified: Secondary | ICD-10-CM | POA: Diagnosis present

## 2013-06-30 DIAGNOSIS — Z129 Encounter for screening for malignant neoplasm, site unspecified: Secondary | ICD-10-CM

## 2013-06-30 DIAGNOSIS — N400 Enlarged prostate without lower urinary tract symptoms: Secondary | ICD-10-CM | POA: Diagnosis present

## 2013-06-30 DIAGNOSIS — I200 Unstable angina: Principal | ICD-10-CM | POA: Diagnosis present

## 2013-06-30 DIAGNOSIS — J810 Acute pulmonary edema: Secondary | ICD-10-CM

## 2013-06-30 DIAGNOSIS — J441 Chronic obstructive pulmonary disease with (acute) exacerbation: Secondary | ICD-10-CM

## 2013-06-30 DIAGNOSIS — Z87898 Personal history of other specified conditions: Secondary | ICD-10-CM

## 2013-06-30 DIAGNOSIS — R7303 Prediabetes: Secondary | ICD-10-CM

## 2013-06-30 DIAGNOSIS — Z9114 Patient's other noncompliance with medication regimen: Secondary | ICD-10-CM

## 2013-06-30 DIAGNOSIS — R079 Chest pain, unspecified: Secondary | ICD-10-CM

## 2013-06-30 DIAGNOSIS — I5021 Acute systolic (congestive) heart failure: Secondary | ICD-10-CM

## 2013-06-30 DIAGNOSIS — F4322 Adjustment disorder with anxiety: Secondary | ICD-10-CM | POA: Diagnosis present

## 2013-06-30 DIAGNOSIS — Z905 Acquired absence of kidney: Secondary | ICD-10-CM

## 2013-06-30 DIAGNOSIS — R0602 Shortness of breath: Secondary | ICD-10-CM

## 2013-06-30 DIAGNOSIS — J449 Chronic obstructive pulmonary disease, unspecified: Secondary | ICD-10-CM

## 2013-06-30 HISTORY — DX: Chronic systolic (congestive) heart failure: I50.22

## 2013-06-30 HISTORY — DX: Ischemic cardiomyopathy: I25.5

## 2013-06-30 LAB — CBC WITH DIFFERENTIAL/PLATELET
Basophils Absolute: 0 10*3/uL (ref 0.0–0.1)
Basophils Relative: 0 % (ref 0–1)
Eosinophils Absolute: 0.2 10*3/uL (ref 0.0–0.7)
Eosinophils Relative: 3 % (ref 0–5)
HCT: 41.1 % (ref 39.0–52.0)
Hemoglobin: 14 g/dL (ref 13.0–17.0)
Lymphs Abs: 1.4 10*3/uL (ref 0.7–4.0)
MCH: 29.9 pg (ref 26.0–34.0)
MCHC: 34.1 g/dL (ref 30.0–36.0)
MCV: 87.6 fL (ref 78.0–100.0)
Monocytes Absolute: 0.7 10*3/uL (ref 0.1–1.0)
Monocytes Relative: 12 % (ref 3–12)
Neutrophils Relative %: 62 % (ref 43–77)
Platelets: 193 10*3/uL (ref 150–400)
RBC: 4.69 MIL/uL (ref 4.22–5.81)
RDW: 14.7 % (ref 11.5–15.5)
WBC: 5.9 10*3/uL (ref 4.0–10.5)

## 2013-06-30 LAB — BASIC METABOLIC PANEL
BUN: 21 mg/dL (ref 6–23)
Chloride: 105 mEq/L (ref 96–112)
Creatinine, Ser: 1.29 mg/dL (ref 0.50–1.35)
GFR calc Af Amer: 64 mL/min — ABNORMAL LOW (ref >90)
GFR calc non Af Amer: 55 mL/min — ABNORMAL LOW (ref >90)
Glucose, Bld: 102 mg/dL — ABNORMAL HIGH (ref 70–99)
Potassium: 3.9 mEq/L (ref 3.5–5.1)

## 2013-06-30 LAB — POCT I-STAT TROPONIN I: Troponin i, poc: 0.01 ng/mL (ref 0.00–0.08)

## 2013-06-30 MED ORDER — NITROGLYCERIN 2 % TD OINT
1.0000 [in_us] | TOPICAL_OINTMENT | Freq: Once | TRANSDERMAL | Status: AC
  Administered 2013-06-30: 1 [in_us] via TOPICAL
  Filled 2013-06-30: qty 30

## 2013-06-30 MED ORDER — ONDANSETRON HCL 4 MG/2ML IJ SOLN
4.0000 mg | Freq: Once | INTRAMUSCULAR | Status: AC
  Administered 2013-06-30: 4 mg via INTRAVENOUS
  Filled 2013-06-30: qty 2

## 2013-06-30 MED ORDER — MORPHINE SULFATE 4 MG/ML IJ SOLN
4.0000 mg | Freq: Once | INTRAMUSCULAR | Status: AC
  Administered 2013-06-30: 4 mg via INTRAVENOUS
  Filled 2013-06-30: qty 1

## 2013-06-30 NOTE — ED Provider Notes (Signed)
CSN: [id number]     Arrival date & time 06/30/13  2129 History   First MD Initiated Contact with Patient 06/30/13 2146     Chief Complaint  Patient presents with  . Chest Pain   (Consider location/radiation/quality/duration/timing/severity/associated sxs/prior Treatment) HPI Comments: Lucas [name] is a 67 y.o. male who presents for evaluation of chest pain. The pain has been present intermittently, today. As a "heavy" sensation. He has tried nitroglycerin at home without relief. On presentation to the emergency department. His pain is 8/10. EMS transported him, and treat him with aspirin and one nitroglycerin, without change in his pain. He has not had pain recently. He last had a cardiac catheterization about 14 months ago. At that time his coronary disease was stable with chronically occluded bypass grafts, and severe, three-vessel native disease. He has not had any recent illnesses. He denies cough, shortness of breath, weakness, or dizziness. Chest pain, occasionally radiates to his left neck. There are no other known modifying factors.  Patient is a 67 y.o. male presenting with chest pain. The history is provided by the patient.  Chest Pain   Past Medical History  Diagnosis Date  . CAD (coronary artery disease)   . Hypertension   . Anxiety   . History of kidney cancer   . ADENOCARCINOMA, PROSTATE   . Adjustment disorder with anxiety   . Hyperlipidemia   . BPH (benign prostatic hypertrophy)   . COPD (chronic obstructive pulmonary disease)   . CAD (coronary artery disease)   . Myocardial infarction   . Shortness of breath   . Arthritis    Past Surgical History  Procedure Laterality Date  . Coronary artery bypass graft      x 5  . Cardiac catheterization    . Nephrectomy      left nephrectomy for ca  . Posterior cervical laminectomy      x 8   limited ROM  and can't lie flat  . Heart stents      x 5  . Robot assisted laparoscopic radical prostatectomy      for  prostate cancer  . Cystoscopy with litholapaxy  05/01/2012    Procedure: CYSTOSCOPY WITH LITHOLAPAXY;  Surgeon: Crecencio Mc, MD;  Location: [facility];  Service: Urology;  Laterality: N/A;   Family History  Problem Relation Age of Onset  . Diabetes Mother   . Coronary artery disease    . Heart disease Mother   . Heart disease Father   . Heart disease Brother   . Diabetes Father    History  Substance Use Topics  . Smoking status: Former Smoker -- 0.30 packs/day for 54 years    Types: Cigarettes    Quit date: 03/29/2013  . Smokeless tobacco: Former Neurosurgeon    Quit date: 04/19/2012     Comment: Using e-cig now  . Alcohol Use: No    Review of Systems  Cardiovascular: Positive for chest pain.  All other systems reviewed and are negative.    Allergies  Ace inhibitors; Ativan; Morphine and related; and Ibuprofen  Home Medications   Current Outpatient Rx  Name  Route  Sig  Dispense  Refill  . albuterol (PROVENTIL HFA;VENTOLIN HFA) 108 (90 BASE) MCG/ACT inhaler   Inhalation   Inhale 2 puffs into the lungs every 6 (six) hours as needed for wheezing or shortness of breath. Shortness of breath         . albuterol (PROVENTIL) (2.5 MG/3ML) 0.083% nebulizer solution   Nebulization  Take 2.5 mg by nebulization 4 (four) times daily.         Marland Kitchen ALPRAZolam (XANAX) 1 MG tablet   Oral   Take 1 mg by mouth 3 (three) times daily as needed for anxiety.         . COMBIVENT RESPIMAT 20-100 MCG/ACT AERS respimat   Inhalation   Inhale 1 puff into the lungs every morning.         Marland Kitchen doxazosin (CARDURA) 8 MG tablet   Oral   Take 8 mg by mouth at bedtime.          . furosemide (LASIX) 40 MG tablet   Oral   Take 20 mg by mouth every evening.         Marland Kitchen HYDROcodone-acetaminophen (NORCO) 10-325 MG per tablet   Oral   Take 1 tablet by mouth every 6 (six) hours as needed for moderate pain. For pain.         Marland Kitchen ipratropium (ATROVENT) 0.02 % nebulizer solution   Nebulization   Take  2.5 mLs (0.5 mg total) by nebulization 4 (four) times daily.   75 mL   12   . NITROSTAT 0.3 MG SL tablet   Sublingual   Place 0.3 mg under the tongue every 5 (five) minutes as needed for chest pain.          . valsartan (DIOVAN) 80 MG tablet   Oral   Take 80 mg by mouth every morning.         . TRADJENTA 5 MG TABS tablet   Oral   Take 1 tablet by mouth every morning.          BP 143/64  Pulse 85  Temp(Src) 98.3 F (36.8 C) (Oral)  Resp 20  SpO2 94% Physical Exam  Nursing note and vitals reviewed. Constitutional: He is oriented to person, place, and time. He appears well-developed and well-nourished.  HENT:  Head: Normocephalic and atraumatic.  Right Ear: External ear normal.  Left Ear: External ear normal.  Eyes: Conjunctivae and EOM are normal. Pupils are equal, round, and reactive to light.  Neck: Normal range of motion and phonation normal. Neck supple.  Cardiovascular: Normal rate, regular rhythm, normal heart sounds and intact distal pulses.   Pulmonary/Chest: Effort normal. No respiratory distress. He exhibits no bony tenderness.  Prolonged air movement bilaterally scattered wheezes and rhonchi  Abdominal: Soft. Normal appearance. There is no tenderness.  Musculoskeletal: Normal range of motion.  Neurological: He is alert and oriented to person, place, and time. No cranial nerve deficit or sensory deficit. He exhibits normal muscle tone. Coordination normal.  Skin: Skin is warm, dry and intact.  Psychiatric: He has a normal mood and affect. His behavior is normal. Judgment and thought content normal.    ED Course  Procedures (including critical care time) Medications  nitroGLYCERIN (NITROGLYN) 2 % ointment 1 inch (1 inch Topical Given 06/30/13 2210)  morphine 4 MG/ML injection 4 mg (4 mg Intravenous Given 06/30/13 2204)  ondansetron (ZOFRAN) injection 4 mg (4 mg Intravenous Given 06/30/13 2204)    Patient Vitals for the past 24 hrs:  BP Temp Temp src  Pulse Resp SpO2  06/30/13 2335 143/64 mmHg - - 85 20 94 %  06/30/13 2230 103/66 mmHg - - 73 21 91 %  06/30/13 2224 131/60 mmHg - - 76 17 94 %  06/30/13 2137 148/89 mmHg 98.3 F (36.8 C) Oral 99 20 95 %     11:03 PM Reevaluation with  update and discussion. After initial assessment and treatment, an updated evaluation reveals his pain is better at 4/10 now. Shereta Crothers L    Consult_ On call Cardiology fellow. He will admit  CRITICAL CARE Performed by: Mancel Bale L Total critical care time:40 minutes Critical care time was exclusive of separately billable procedures and treating other patients. Critical care was necessary to treat or prevent imminent or life-threatening deterioration. Critical care was time spent personally by me on the following activities: development of treatment plan with patient and/or surrogate as well as nursing, discussions with consultants, evaluation of patient's response to treatment, examination of patient, obtaining history from patient or surrogate, ordering and performing treatments and interventions, ordering and review of laboratory studies, ordering and review of radiographic studies, pulse oximetry and re-evaluation of patient's condition.  Labs Review Labs Reviewed  BASIC METABOLIC PANEL - Abnormal; Notable for the following:    Glucose, Bld 102 (*)    GFR calc non Af Amer 55 (*)    GFR calc Af Amer 64 (*)    All other components within normal limits  CBC WITH DIFFERENTIAL  POCT I-STAT TROPONIN I   Imaging Review Dg Chest Port 1 View  06/30/2013   CLINICAL DATA:  67 year old male with chest pain and shortness of breath.  EXAM: PORTABLE CHEST - 1 VIEW  COMPARISON:  05/24/2013 and prior chest radiographs  FINDINGS: Cardiomegaly and CABG changes noted.  Mild interstitial prominence is noted and could represent very mild interstitial edema.  There is no evidence of focal airspace disease, suspicious pulmonary nodule/mass, pleural effusion, or  pneumothorax. No acute bony abnormalities are identified.  IMPRESSION: Mild interstitial prominence - mild interstitial edema not excluded.  Cardiomegaly and CABG changes.   Electronically Signed   By: Laveda Abbe M.D.   On: 06/30/2013 22:19    EKG Interpretation    Date/Time:  Saturday June 30 2013 21:43:43 EST Ventricular Rate:  90 PR Interval:  153 QRS Duration: 98 QT Interval:  422 QTC Calculation: 516 R Axis:   84 Text Interpretation:  Unknown rhythm, irregular rate Anterior infarct, old Prolonged QT interval Since last tracing of earlier today QT has lengthened Confirmed by Effie Shy  MD, Tavaras Goody ([id number]) on 06/30/2013 10:05:04 PM            MDM   1. Unstable angina      Chest pain, with complicated cardiac history. Initial troponin negative. Evaluation is consistent with acute coronary syndrome/unstable angina. Patient will require admission to a cardiology service for further evaluation and treatment. In consideration of cardiac catheterization. There is no evidence for acute infarct.   Nursing Notes Reviewed/ Care Coordinated, and agree without changes. Applicable Imaging Reviewed.  Interpretation of Laboratory Data incorporated into ED treatment  Plan: Admit   Flint Melter, MD 06/30/13 2348

## 2013-06-30 NOTE — ED Notes (Signed)
Per EMS report: Pt c/o chest pain since this morning and has been increasingly become worse.  On EMS arrival, pt reports taking 3 nitros without improvement.  Pt reports pain on the left that radiates to left neck and shoulder blade.  Pt denies SOB or diaphoresis. Pt has a nonproductive cough.  Pt denies pain increases with inspiration or palpation.  EMS EKG showed afib and right bbb. Pt a/o x 4. EMS gave pt 324mg  of aspirin and 1 nitro.

## 2013-06-30 NOTE — ED Notes (Signed)
Bed: RESA Expected date: 06/30/13 Expected time: 9:14 PM Means of arrival: Ambulance Comments: Chest pain, hx of Afib

## 2013-07-01 DIAGNOSIS — I517 Cardiomegaly: Secondary | ICD-10-CM

## 2013-07-01 DIAGNOSIS — R079 Chest pain, unspecified: Secondary | ICD-10-CM | POA: Insufficient documentation

## 2013-07-01 DIAGNOSIS — I200 Unstable angina: Principal | ICD-10-CM

## 2013-07-01 LAB — BASIC METABOLIC PANEL
BUN: 30 mg/dL — ABNORMAL HIGH (ref 6–23)
Chloride: 106 mEq/L (ref 96–112)
Creatinine, Ser: 1.76 mg/dL — ABNORMAL HIGH (ref 0.50–1.35)
GFR calc non Af Amer: 38 mL/min — ABNORMAL LOW (ref >90)
Glucose, Bld: 90 mg/dL (ref 70–99)
Potassium: 5.2 mEq/L — ABNORMAL HIGH (ref 3.5–5.1)

## 2013-07-01 LAB — T4, FREE: Free T4: 1.01 ng/dL (ref 0.80–1.80)

## 2013-07-01 LAB — LIPID PANEL
Cholesterol: 208 mg/dL — ABNORMAL HIGH (ref 0–200)
HDL: 34 mg/dL — ABNORMAL LOW (ref >39)
LDL Cholesterol: 145 mg/dL — ABNORMAL HIGH (ref 0–99)

## 2013-07-01 LAB — TSH: TSH: 1.729 u[IU]/mL (ref 0.350–4.500)

## 2013-07-01 LAB — TROPONIN I
Troponin I: <0.3 ng/mL (ref <0.30)
Troponin I: <0.3 ng/mL (ref <0.30)

## 2013-07-01 LAB — D-DIMER, QUANTITATIVE: D-Dimer, Quant: 0.72 ug/mL-FEU — ABNORMAL HIGH (ref 0.00–0.48)

## 2013-07-01 LAB — CBC
HCT: 40.2 % (ref 39.0–52.0)
Hemoglobin: 13.3 g/dL (ref 13.0–17.0)
WBC: 6.7 10*3/uL (ref 4.0–10.5)

## 2013-07-01 LAB — PROTIME-INR: Prothrombin Time: 14.1 seconds (ref 11.6–15.2)

## 2013-07-01 LAB — HEMOGLOBIN A1C
Hgb A1c MFr Bld: 6.1 % — ABNORMAL HIGH (ref <5.7)
Mean Plasma Glucose: 128 mg/dL — ABNORMAL HIGH (ref <117)

## 2013-07-01 LAB — PRO B NATRIURETIC PEPTIDE: Pro B Natriuretic peptide (BNP): 1742 pg/mL — ABNORMAL HIGH (ref 0–125)

## 2013-07-01 MED ORDER — ONDANSETRON HCL 4 MG/2ML IJ SOLN: 4.0000 mg | Freq: Four times a day (QID) | INTRAMUSCULAR | Status: DC | PRN

## 2013-07-01 MED ORDER — ALBUTEROL SULFATE (5 MG/ML) 0.5% IN NEBU: 2.5000 mg | INHALATION_SOLUTION | RESPIRATORY_TRACT | Status: DC | PRN

## 2013-07-01 MED ORDER — ALPRAZOLAM 1 MG PO TABS
1.0000 mg | ORAL_TABLET | Freq: Three times a day (TID) | ORAL | Status: DC | PRN
  Administered 2013-07-01 (×2): 1 mg via ORAL
  Filled 2013-07-01 (×2): qty 1

## 2013-07-01 MED ORDER — IPRATROPIUM BROMIDE 0.02 % IN SOLN: 0.5000 mg | RESPIRATORY_TRACT | Status: DC | PRN

## 2013-07-01 MED ORDER — IPRATROPIUM BROMIDE 0.02 % IN SOLN
0.5000 mg | Freq: Four times a day (QID) | RESPIRATORY_TRACT | Status: DC
  Administered 2013-07-01 – 2013-07-02 (×5): 0.5 mg via RESPIRATORY_TRACT
  Filled 2013-07-01 (×5): qty 2.5

## 2013-07-01 MED ORDER — DOXAZOSIN MESYLATE 8 MG PO TABS
8.0000 mg | ORAL_TABLET | Freq: Every day | ORAL | Status: DC
  Administered 2013-07-01 (×2): 8 mg via ORAL
  Filled 2013-07-01 (×3): qty 1

## 2013-07-01 MED ORDER — IRBESARTAN 75 MG PO TABS
75.0000 mg | ORAL_TABLET | Freq: Every day | ORAL | Status: DC
  Administered 2013-07-02: 10:00:00 75 mg via ORAL
  Filled 2013-07-01: qty 1

## 2013-07-01 MED ORDER — ASPIRIN EC 81 MG PO TBEC
81.0000 mg | DELAYED_RELEASE_TABLET | Freq: Every day | ORAL | Status: DC
  Administered 2013-07-01 – 2013-07-02 (×2): 81 mg via ORAL
  Filled 2013-07-01 (×2): qty 1

## 2013-07-01 MED ORDER — PROMETHAZINE HCL 25 MG PO TABS
25.0000 mg | ORAL_TABLET | Freq: Four times a day (QID) | ORAL | Status: DC | PRN
  Administered 2013-07-01: 25 mg via ORAL
  Filled 2013-07-01: qty 1

## 2013-07-01 MED ORDER — DIPHENHYDRAMINE HCL 50 MG/ML IJ SOLN
25.0000 mg | Freq: Four times a day (QID) | INTRAMUSCULAR | Status: DC | PRN
  Administered 2013-07-01: 13:00:00 25 mg via INTRAVENOUS
  Filled 2013-07-01: qty 1

## 2013-07-01 MED ORDER — FUROSEMIDE 20 MG PO TABS
20.0000 mg | ORAL_TABLET | Freq: Every evening | ORAL | Status: DC
  Filled 2013-07-01: qty 1

## 2013-07-01 MED ORDER — NITROGLYCERIN IN D5W 200-5 MCG/ML-% IV SOLN: 2.0000 ug/min | INTRAVENOUS | Status: DC

## 2013-07-01 MED ORDER — HYDROCODONE-ACETAMINOPHEN 10-325 MG PO TABS: 1.0000 | ORAL_TABLET | Freq: Four times a day (QID) | ORAL | Status: DC | PRN

## 2013-07-01 MED ORDER — ALBUTEROL SULFATE (5 MG/ML) 0.5% IN NEBU
2.5000 mg | INHALATION_SOLUTION | Freq: Four times a day (QID) | RESPIRATORY_TRACT | Status: DC | PRN
  Administered 2013-07-01: 02:00:00 2.5 mg via RESPIRATORY_TRACT
  Filled 2013-07-01: qty 0.5

## 2013-07-01 MED ORDER — ISOSORBIDE MONONITRATE ER 30 MG PO TB24
30.0000 mg | ORAL_TABLET | Freq: Every day | ORAL | Status: DC
  Administered 2013-07-01: 30 mg via ORAL
  Filled 2013-07-01 (×2): qty 1

## 2013-07-01 MED ORDER — IPRATROPIUM BROMIDE 0.02 % IN SOLN
0.5000 mg | Freq: Four times a day (QID) | RESPIRATORY_TRACT | Status: DC | PRN
  Administered 2013-07-01: 0.5 mg via RESPIRATORY_TRACT
  Filled 2013-07-01: qty 2.5

## 2013-07-01 MED ORDER — FAMOTIDINE IN NACL 20-0.9 MG/50ML-% IV SOLN
20.0000 mg | Freq: Once | INTRAVENOUS | Status: AC
  Administered 2013-07-01: 20 mg via INTRAVENOUS
  Filled 2013-07-01: qty 50

## 2013-07-01 MED ORDER — ACETAMINOPHEN 325 MG PO TABS: 650.0000 mg | ORAL_TABLET | ORAL | Status: DC | PRN

## 2013-07-01 MED ORDER — PANTOPRAZOLE SODIUM 40 MG PO TBEC
40.0000 mg | DELAYED_RELEASE_TABLET | Freq: Every day | ORAL | Status: DC
  Filled 2013-07-01 (×2): qty 1

## 2013-07-01 MED ORDER — MORPHINE SULFATE 4 MG/ML IJ SOLN
4.0000 mg | INTRAMUSCULAR | Status: DC | PRN
  Administered 2013-07-01 – 2013-07-02 (×7): 4 mg via INTRAVENOUS
  Filled 2013-07-01 (×7): qty 1

## 2013-07-01 MED ORDER — ALBUTEROL SULFATE (5 MG/ML) 0.5% IN NEBU
2.5000 mg | INHALATION_SOLUTION | Freq: Four times a day (QID) | RESPIRATORY_TRACT | Status: DC
Start: 2013-07-01 — End: 2013-07-02
  Administered 2013-07-01 – 2013-07-02 (×5): 2.5 mg via RESPIRATORY_TRACT
  Filled 2013-07-01 (×5): qty 0.5

## 2013-07-01 MED ORDER — METOPROLOL TARTRATE 12.5 MG HALF TABLET
12.5000 mg | ORAL_TABLET | Freq: Two times a day (BID) | ORAL | Status: DC
  Administered 2013-07-01 – 2013-07-02 (×4): 12.5 mg via ORAL
  Filled 2013-07-01 (×5): qty 1

## 2013-07-01 MED ORDER — IPRATROPIUM-ALBUTEROL 20-100 MCG/ACT IN AERS
1.0000 | INHALATION_SPRAY | Freq: Every morning | RESPIRATORY_TRACT | Status: DC
  Filled 2013-07-01: qty 4

## 2013-07-01 MED ORDER — LINAGLIPTIN 5 MG PO TABS
5.0000 mg | ORAL_TABLET | Freq: Every morning | ORAL | Status: DC
  Administered 2013-07-01: 5 mg via ORAL
  Filled 2013-07-01 (×2): qty 1

## 2013-07-01 MED ORDER — IRBESARTAN 150 MG PO TABS
150.0000 mg | ORAL_TABLET | Freq: Every day | ORAL | Status: DC
  Administered 2013-07-01: 10:00:00 150 mg via ORAL
  Filled 2013-07-01: qty 1

## 2013-07-01 MED ORDER — ENOXAPARIN SODIUM 40 MG/0.4ML ~~LOC~~ SOLN
40.0000 mg | SUBCUTANEOUS | Status: DC
  Administered 2013-07-01: 10:00:00 40 mg via SUBCUTANEOUS
  Filled 2013-07-01 (×2): qty 0.4

## 2013-07-01 MED ORDER — ALBUTEROL SULFATE HFA 108 (90 BASE) MCG/ACT IN AERS
2.0000 | INHALATION_SPRAY | Freq: Four times a day (QID) | RESPIRATORY_TRACT | Status: DC | PRN
  Filled 2013-07-01: qty 6.7

## 2013-07-01 MED ORDER — NITROGLYCERIN 0.4 MG SL SUBL
0.4000 mg | SUBLINGUAL_TABLET | SUBLINGUAL | Status: DC | PRN
  Filled 2013-07-01: qty 25

## 2013-07-01 MED ORDER — EZETIMIBE 10 MG PO TABS
10.0000 mg | ORAL_TABLET | Freq: Every day | ORAL | Status: DC
  Administered 2013-07-01: 10 mg via ORAL
  Filled 2013-07-01 (×2): qty 1

## 2013-07-01 NOTE — Progress Notes (Addendum)
Patient refused CPAP at this time.  States he does not used this equipment at home. Machine not set up in room.

## 2013-07-01 NOTE — Progress Notes (Signed)
D. Dimer is 0.72.  Dr. Donnie Aho on call, notified.  No new orders at this time.  Will continue to monitor.

## 2013-07-01 NOTE — Progress Notes (Signed)
  Echocardiogram 2D Echocardiogram has been performed.  Lucas [name] 07/01/2013, 12:00 PM

## 2013-07-01 NOTE — Progress Notes (Signed)
Patient c/o consistently of CP 7/10 in same left anterior location of chest radiating to shoulder.  Requesting prn morphine.  Pain to a 5/10 with morphine.  Still refusing nitro drip, but states he will take sublingual nitro.  EKG performed and PA on call paged.  Patient remains calm, eating lunch at bedside.  Will continue to monitor.

## 2013-07-01 NOTE — H&P (Signed)
Lucas [name] is an 67 y.o. male.    Primary Cardiologist: Dr. Charlton Haws  Chief Complaint: Chest Pain  HPI: 67 y/o male with a PMH of Oxygen-requiring COPD and CAD s/p CABG x 5 in 1997 presenting for chest pain evaluation.  Per review of Dr. Fabio Bering note, he had a cardiac cath 04/21/2012 which showed LVEF 45%, patent LIMA-->LAD, patent SVG-->OM3 and chronic occlusion of other grafts (cath report not available).  His last TTE from 04/04/2013 showed LVEF 50-55% with mild AS (LVOT/V2 velocity ratio of 0.44). Patient has been relatively asymptomatic until the day of presentation when he started to experience chest pain at rest.  His chest pain is described as pressure, was originally 7/10 in severity but now 4/10 after Morphine (no allergic reaction to Morphine) and SL NTG, associated with nausea and radiating to neck.  He denies vomiting, diaphoresis, syncope. In ER, his EKG obtained 06/30/2013 at 21:37 showed sinus rhythm with heart rate of 99 bpm, Q-waves in leads V1-V2 and non-specific T-wave abnormalities.  He is not on any statin because muscle cramps to statins.  In addition, he was previously on Imdur which he is no longer taking. His first set of cardiac marker is negative.  He is currently hemodynamically stable.  Past Medical History  Diagnosis Date  . CAD (coronary artery disease)   . Hypertension   . Anxiety   . History of kidney cancer   . ADENOCARCINOMA, PROSTATE   . Adjustment disorder with anxiety   . Hyperlipidemia   . BPH (benign prostatic hypertrophy)   . COPD (chronic obstructive pulmonary disease)   . CAD (coronary artery disease)   . Myocardial infarction   . Shortness of breath   . Arthritis     Past Surgical History  Procedure Laterality Date  . Coronary artery bypass graft      x 5  . Cardiac catheterization    . Nephrectomy      left nephrectomy for ca  . Posterior cervical laminectomy      x 8   limited ROM  and can't lie flat  . Heart stents      x 5   . Robot assisted laparoscopic radical prostatectomy      for prostate cancer  . Cystoscopy with litholapaxy  05/01/2012    Procedure: CYSTOSCOPY WITH LITHOLAPAXY;  Surgeon: Crecencio Mc, MD;  Location: [facility];  Service: Urology;  Laterality: N/A;    Family History  Problem Relation Age of Onset  . Diabetes Mother   . Coronary artery disease    . Heart disease Mother   . Heart disease Father   . Heart disease Brother   . Diabetes Father    Social History:  reports that he quit smoking about 3 months ago. His smoking use included Cigarettes. He has a 16.2 pack-year smoking history. He quit smokeless tobacco use about 14 months ago. He reports that he does not drink alcohol or use illicit drugs.  Allergies:  Allergies  Allergen Reactions  . Ace Inhibitors Cough  . Ativan [Lorazepam] Other (See Comments)    Increases agitation, tolerates xanax  . Morphine And Related     Hallucinations, too sedated when given with ativan  . Ibuprofen Rash    Tolerates aspirin per patient.       (Not in a hospital admission)  Results for orders placed during the hospital encounter of 06/30/13 (from the past 48 hour(s))  CBC WITH DIFFERENTIAL     Status: None  Collection Time    06/30/13 10:05 PM      Result Value Range   WBC 5.9  4.0 - 10.5 K/uL   RBC 4.69  4.22 - 5.81 MIL/uL   Hemoglobin 14.0  13.0 - 17.0 g/dL   HCT 78.4  69.6 - 29.5 %   MCV 87.6  78.0 - 100.0 fL   MCH 29.9  26.0 - 34.0 pg   MCHC 34.1  30.0 - 36.0 g/dL   RDW 28.4  13.2 - 44.0 %   Platelets 193  150 - 400 K/uL   Neutrophils Relative % 62  43 - 77 %   Neutro Abs 3.7  1.7 - 7.7 K/uL   Lymphocytes Relative 23  12 - 46 %   Lymphs Abs 1.4  0.7 - 4.0 K/uL   Monocytes Relative 12  3 - 12 %   Monocytes Absolute 0.7  0.1 - 1.0 K/uL   Eosinophils Relative 3  0 - 5 %   Eosinophils Absolute 0.2  0.0 - 0.7 K/uL   Basophils Relative 0  0 - 1 %   Basophils Absolute 0.0  0.0 - 0.1 K/uL  BASIC METABOLIC PANEL     Status: Abnormal    Collection Time    06/30/13 10:05 PM      Result Value Range   Sodium 138  135 - 145 mEq/L   Potassium 3.9  3.5 - 5.1 mEq/L   Chloride 105  96 - 112 mEq/L   CO2 22  19 - 32 mEq/L   Glucose, Bld 102 (*) 70 - 99 mg/dL   BUN 21  6 - 23 mg/dL   Creatinine, Ser 1.02  0.50 - 1.35 mg/dL   Calcium 9.0  8.4 - 72.5 mg/dL   GFR calc non Af Amer 55 (*) >90 mL/min   GFR calc Af Amer 64 (*) >90 mL/min   Comment: (NOTE)     The eGFR has been calculated using the CKD EPI equation.     This calculation has not been validated in all clinical situations.     eGFR's persistently <90 mL/min signify possible Chronic Kidney     Disease.  POCT I-STAT TROPONIN I     Status: None   Collection Time    06/30/13 10:10 PM      Result Value Range   Troponin i, poc 0.01  0.00 - 0.08 ng/mL   Comment 3            Comment: Due to the release kinetics of cTnI,     a negative result within the first hours     of the onset of symptoms does not rule out     myocardial infarction with certainty.     If myocardial infarction is still suspected,     repeat the test at appropriate intervals.   Dg Chest Port 1 View  06/30/2013   CLINICAL DATA:  67 year old male with chest pain and shortness of breath.  EXAM: PORTABLE CHEST - 1 VIEW  COMPARISON:  05/24/2013 and prior chest radiographs  FINDINGS: Cardiomegaly and CABG changes noted.  Mild interstitial prominence is noted and could represent very mild interstitial edema.  There is no evidence of focal airspace disease, suspicious pulmonary nodule/mass, pleural effusion, or pneumothorax. No acute bony abnormalities are identified.  IMPRESSION: Mild interstitial prominence - mild interstitial edema not excluded.  Cardiomegaly and CABG changes.   Electronically Signed   By: Laveda Abbe M.D.   On: 06/30/2013 22:19  Review of Systems  Constitutional: Negative for fever, chills, weight loss, malaise/fatigue and diaphoresis.  HENT: Negative for congestion, ear discharge, hearing  loss, nosebleeds and tinnitus.   Eyes: Negative for blurred vision, double vision, photophobia and discharge.  Respiratory: Positive for shortness of breath. Negative for cough, hemoptysis, sputum production and wheezing.   Cardiovascular: Positive for chest pain. Negative for palpitations, orthopnea, claudication, leg swelling and PND.  Gastrointestinal: Positive for nausea. Negative for heartburn, vomiting, abdominal pain, diarrhea, constipation, blood in stool and melena.  Genitourinary: Negative for dysuria, urgency and frequency.  Musculoskeletal: Negative for back pain, joint pain, myalgias and neck pain.  Skin: Negative for itching and rash.  Neurological: Negative for dizziness, tingling, tremors, sensory change, speech change, seizures, weakness and headaches.  Psychiatric/Behavioral: Negative for depression and suicidal ideas.    Blood pressure 143/64, pulse 85, temperature 98.3 F (36.8 C), temperature source Oral, resp. rate 20, SpO2 94.00%. Physical Exam  Constitutional: He is oriented to person, place, and time. He appears well-developed and well-nourished. No distress.  HENT:  Head: Normocephalic and atraumatic.  Eyes: Pupils are equal, round, and reactive to light. Right eye exhibits no discharge. Left eye exhibits no discharge. No scleral icterus.  Neck: Normal range of motion. Neck supple. No JVD present. No tracheal deviation present. No thyromegaly present.  Cardiovascular: Normal rate, regular rhythm and normal heart sounds.  Exam reveals no gallop and no friction rub.   No murmur heard. Respiratory: Effort normal and breath sounds normal. No stridor. No respiratory distress. He has no wheezes. He has no rales. He exhibits no tenderness.  GI: Soft. Bowel sounds are normal. He exhibits distension. There is no tenderness. There is no rebound and no guarding.  Musculoskeletal: Normal range of motion. He exhibits no edema and no tenderness.  Neurological: He is alert and  oriented to person, place, and time. No cranial nerve deficit. Coordination normal.  Skin: No rash noted. He is not diaphoretic. No erythema.  Psychiatric: He has a normal mood and affect.     Assessment/Plan  1.  Chest Pain 2.  CAD s/p CABG x 5 (cath 04/21/2012 with patent LIMA--LAD, patent SVG-->OM3; all other grafts are occluded) 3.  Oxygen-requiring COPD 4.  HTN 5.  Anxiety   We will admit the patient to cardiology to be observed on telemetry.  We will re-start the patient on his home medication, but add Ezetimibe 10 mg qd, low-dose beta-blockers and Imdur 30 mg qd to his regimen.  If he has recurrent chest pain, we will treat him with prn Morphine (confirmed with patient that he has no allergy to Morphine) and Nitrates.  We will obtain serial cardiac markers and TTE in the morning to evaluate LV function and severity of AS.  If he rules in for MI with cardiac markers, we will consider transfer to [facility] for cardiac catheterization; otherwise, we will treat him medically.   Tashyra Adduci E 07/01/2013, 12:03 AM

## 2013-07-01 NOTE — Progress Notes (Addendum)
Pt continued to have CP through today. EKG nonacute and troponins have remained negative despite constant CP. BP borderline for SL NTG. Pt already refused NTG gtt. He was placed on Imdur this admission. Had morphine already ordered which has helped but not relieved pain. Breathing tx brought pain down from 5 to 3/10. Not hypertensive and no widened mediastinum. D/w MD. Will try IV pepcid to see if this gives him any symptomatic relief from GI standpoint and start Protonix. Avoiding GI cocktail given renal insufficiency. Given bump in Cr/borderline low BP, will also hold Lasix tonight and decrease ARB dose for tomorrow. Send stat d-dimer and if abnormal, will need VQ scan. Await echo. Vaneta Hammontree PA-C  Discussed echo results with MD. EF decreased from prior but she believes it is globally down in nonischemic pattern. No sig AS. We will carry out current plan as above. Esaiah Wanless PA-C

## 2013-07-01 NOTE — Progress Notes (Signed)
Patient Name: Lucas [name] Date of Encounter: 07/01/2013     Active Problems:   Chest pain    SUBJECTIVE  His chest pain is improved but not entirely gone since he received a shot of morphine.  His neck pain is no longer present.  His rhythm is stable normal sinus rhythm.  Initial cardiac enzymes are normal.  CURRENT MEDS . albuterol  2.5 mg Nebulization Q6H  . aspirin EC  81 mg Oral Daily  . doxazosin  8 mg Oral QHS  . enoxaparin (LOVENOX) injection  40 mg Subcutaneous Q24H  . ezetimibe  10 mg Oral Daily  . furosemide  20 mg Oral QPM  . ipratropium  0.5 mg Nebulization Q6H  . irbesartan  150 mg Oral Daily  . isosorbide mononitrate  30 mg Oral Daily  . linagliptin  5 mg Oral q morning - 10a  . metoprolol tartrate  12.5 mg Oral BID    OBJECTIVE  Filed Vitals:   06/30/13 2335 07/01/13 0118 07/01/13 0135 07/01/13 0444  BP: 143/64 141/63  91/52  Pulse: 85 86  64  Temp:  98.5 F (36.9 C)  98.3 F (36.8 C)  TempSrc:  Oral  Oral  Resp: 20 22  20   Height:  5\' 8"  (1.727 m)    Weight:  203 lb 0.7 oz (92.1 kg)    SpO2: 94% 98% 97% 94%    Intake/Output Summary (Last 24 hours) at 07/01/13 0744 Last data filed at 07/01/13 0600  Gross per 24 hour  Intake    120 ml  Output      0 ml  Net    120 ml   Filed Weights   07/01/13 0118  Weight: 203 lb 0.7 oz (92.1 kg)    PHYSICAL EXAM  General: Pleasant, NAD.  Nasal oxygen in place Neuro: Alert and oriented X 3. Moves all extremities spontaneously. Psych: Normal affect. HEENT:  Normal  Neck: Supple without bruits or JVD. Lungs:  Increased AP diameter and decreased distant breath sounds consistent with COPD Heart: RRR no s3, s4, or murmurs. Abdomen: Soft, non-tender, non-distended, BS + x 4.  Extremities: No clubbing, cyanosis or edema. DP/PT/Radials 2+ and equal bilaterally.  Accessory Clinical Findings  CBC  Recent Labs  06/30/13 2205  WBC 5.9  NEUTROABS 3.7  HGB 14.0  HCT 41.1  MCV 87.6  PLT 193    Basic Metabolic Panel  Recent Labs  06/30/13 2205 07/01/13 0133  NA 138  --   K 3.9  --   CL 105  --   CO2 22  --   GLUCOSE 102*  --   BUN 21  --   CREATININE 1.29  --   CALCIUM 9.0  --   MG  --  2.0   Liver Function Tests No results found for this basename: AST, ALT, ALKPHOS, BILITOT, PROT, ALBUMIN,  in the last 72 hours No results found for this basename: LIPASE, AMYLASE,  in the last 72 hours Cardiac Enzymes  Recent Labs  07/01/13 0133  TROPONINI <0.30   BNP No components found with this basename: POCBNP,  D-Dimer No results found for this basename: DDIMER,  in the last 72 hours Hemoglobin A1C No results found for this basename: HGBA1C,  in the last 72 hours Fasting Lipid Panel No results found for this basename: CHOL, HDL, LDLCALC, TRIG, CHOLHDL, LDLDIRECT,  in the last 72 hours Thyroid Function Tests No results found for this basename: TSH, T4TOTAL, FREET3, T3FREE, THYROIDAB,  in the last 72 hours  TELE  Normal sinus rhythm  ECG  Normal sinus rhythm with PACs.  Old anteroseptal myocardial infarction.  No acute ischemic changes.  Radiology/Studies  Dg Chest Port 1 View  06/30/2013   CLINICAL DATA:  67 year old male with chest pain and shortness of breath.  EXAM: PORTABLE CHEST - 1 VIEW  COMPARISON:  05/24/2013 and prior chest radiographs  FINDINGS: Cardiomegaly and CABG changes noted.  Mild interstitial prominence is noted and could represent very mild interstitial edema.  There is no evidence of focal airspace disease, suspicious pulmonary nodule/mass, pleural effusion, or pneumothorax. No acute bony abnormalities are identified.  IMPRESSION: Mild interstitial prominence - mild interstitial edema not excluded.  Cardiomegaly and CABG changes.   Electronically Signed   By: Laveda Abbe M.D.   On: 06/30/2013 22:19    ASSESSMENT AND PLAN 1. Chest Pain-initial troponin is normal. 2. CAD s/p CABG x 5 (cath 04/21/2012 with patent LIMA--LAD, patent SVG-->OM3; all  other grafts are occluded)  3. Oxygen-requiring COPD  4. HTN  5. Anxiety  We will admit the patient to cardiology to be observed on telemetry. We will re-start the patient on his home medication, but add Ezetimibe 10 mg qd, low-dose beta-blockers and Imdur 30 mg qd to his regimen. If he has recurrent chest pain, we will treat him with prn Morphine (confirmed with patient that he has no allergy to Morphine) and Nitrates. We will obtain serial cardiac markers and TTE in the morning to evaluate LV function and severity of AS. If he rules in for MI with cardiac markers, we will consider transfer to [facility] for cardiac catheterization; otherwise, we will treat him medically.  The patient has improved since admission but still having mild residual discomfort this morning.  He is not in any acute distress.    Signed, Cassell Clement  MD

## 2013-07-01 NOTE — Progress Notes (Signed)
Patient states he has an appointment for both Pulmonary Rehabilitation and an overnight sleep study on July 19, 2013. At this time, he refuses to use nocturnal CPAP/BiPAP. He wishes to have the results of the upcoming sleep study prior to starting on a nocturnal pressure. He is encouraged to call for further assistance if he should change his mind and become more compliant.

## 2013-07-02 ENCOUNTER — Encounter (HOSPITAL_COMMUNITY): Payer: Self-pay | Admitting: Nurse Practitioner

## 2013-07-02 DIAGNOSIS — R079 Chest pain, unspecified: Secondary | ICD-10-CM

## 2013-07-02 DIAGNOSIS — I200 Unstable angina: Secondary | ICD-10-CM

## 2013-07-02 DIAGNOSIS — J9620 Acute and chronic respiratory failure, unspecified whether with hypoxia or hypercapnia: Secondary | ICD-10-CM

## 2013-07-02 DIAGNOSIS — I255 Ischemic cardiomyopathy: Secondary | ICD-10-CM | POA: Diagnosis present

## 2013-07-02 DIAGNOSIS — I509 Heart failure, unspecified: Secondary | ICD-10-CM

## 2013-07-02 DIAGNOSIS — I5043 Acute on chronic combined systolic (congestive) and diastolic (congestive) heart failure: Secondary | ICD-10-CM

## 2013-07-02 LAB — BASIC METABOLIC PANEL
BUN: 40 mg/dL — ABNORMAL HIGH (ref 6–23)
CO2: 25 mEq/L (ref 19–32)
Calcium: 8.7 mg/dL (ref 8.4–10.5)
Chloride: 101 mEq/L (ref 96–112)
Creatinine, Ser: 1.76 mg/dL — ABNORMAL HIGH (ref 0.50–1.35)
GFR calc Af Amer: 44 mL/min — ABNORMAL LOW (ref >90)
GFR calc non Af Amer: 38 mL/min — ABNORMAL LOW (ref >90)
Glucose, Bld: 90 mg/dL (ref 70–99)
Potassium: 4.4 mEq/L (ref 3.5–5.1)
Sodium: 136 mEq/L (ref 135–145)

## 2013-07-02 LAB — CBC
Hemoglobin: 12.5 g/dL — ABNORMAL LOW (ref 13.0–17.0)
Platelets: 188 10*3/uL (ref 150–400)
RBC: 4.26 MIL/uL (ref 4.22–5.81)

## 2013-07-02 MED ORDER — ASPIRIN 81 MG PO TBEC: 81.0000 mg | DELAYED_RELEASE_TABLET | Freq: Every day | ORAL | Status: DC

## 2013-07-02 MED ORDER — EZETIMIBE 10 MG PO TABS: 10.0000 mg | ORAL_TABLET | Freq: Every day | ORAL | Status: DC

## 2013-07-02 MED ORDER — ISOSORBIDE MONONITRATE ER 30 MG PO TB24: 30.0000 mg | ORAL_TABLET | Freq: Every day | ORAL | Status: DC

## 2013-07-02 MED ORDER — METOPROLOL TARTRATE 25 MG PO TABS: 12.5000 mg | ORAL_TABLET | Freq: Two times a day (BID) | ORAL | Status: DC

## 2013-07-02 MED ORDER — PANTOPRAZOLE SODIUM 40 MG PO TBEC: 40.0000 mg | DELAYED_RELEASE_TABLET | Freq: Every day | ORAL | Status: DC

## 2013-07-02 NOTE — Discharge Summary (Signed)
Discharge Summary   Patient ID: Lucas [name],  MRN: [id number], DOB/AGE: 12-03-1945 67 y.o.  Admit date: 06/30/2013 Discharge date: 07/02/2013  Primary Care Provider: Alva Garnet. Primary Cardiologist: P. Eden Emms, MD   Discharge Diagnoses Principal Problem:   Unstable angina  **No objective evidence of ischemia this admission.  **Medical therapy  Active Problems:   Adjustment disorder with anxiety   CORONARY ARTERY DISEASE   COPD (chronic obstructive pulmonary disease)   Ischemic cardiomyopathy   Chronic systolic CHF (congestive heart failure)   HYPERTENSION   Obesity  Allergies Allergies  Allergen Reactions  . Ace Inhibitors Cough  . Ativan [Lorazepam] Other (See Comments)    Increases agitation, tolerates xanax  . Morphine And Related     Hallucinations, too sedated when given with ativan  . Ibuprofen Rash    Tolerates aspirin per patient.     Procedures  2D Echocardiogram 07/01/2013  Study Conclusions  - Left ventricle: The cavity size was moderately dilated.   There was moderate concentric hypertrophy. Systolic   function was moderately to severely reduced. The estimated   ejection fraction was in the range of 30% to 35%. Wall   motion was normal; there were no regional wall motion   abnormalities. Features are consistent with a pseudonormal   left ventricular filling pattern, with concomitant   abnormal relaxation and increased filling pressure (grade   2 diastolic dysfunction). Doppler parameters are   consistent with elevated ventricular end-diastolic filling   pressure. - Aortic valve: Trileaflet; mildly thickened leaflets. There   was no stenosis. Trivial regurgitation. Valve area:   2.62cm^2 (Vmax). - Left atrium: The atrium was moderately dilated. - Right ventricle: The cavity size was mildly dilated. Wall   thickness was normal. Systolic function was normal. - Atrial septum: No defect or patent foramen ovale was    identified. _____________   History of Present Illness  67 y/o male with h/o CAD s/p prior CABG x 5, hypertension, hyperlipidemia, and supplemental oxygen requiring COPD.  He was in his usual state of health until 06/30/2013, when he began to experience chest pressure at rest associated with nausea and neck pain.  He presented to the ED at [facility] where his ECG showed no acute changes and troponin was normal.  His pain was somewhat relieved after receiving IV morphine.  He was admitted for further evaluation.  Hospital Course  Patient ruled out for MI.  He continued to report chest pain, that improved with IV morphine.  Despite prolonged duration of pain, there was no objective evidence of ischemia and pain was felt to be atypical in nature.  PPI therapy was initiated along with zetia (recent cramping with statin therapy) and long-acting nitrate.  A 2D echocardiogram was carried out on 12/21 revealing a new finding of cardiomyopathy with an EF of 30-35%.  Low-dose beta blocker was initiated and he was maintained on his home dose of ARB therapy.  He had no clinical evidence of CHF.  Lucas [name] will be discharged home today in good condition.  Discharge Vitals Blood pressure 114/61, pulse 53, temperature 97.8 F (36.6 C), temperature source Oral, resp. rate 20, height 5\' 8"  (1.727 m), weight 207 lb 14.3 oz (94.3 kg), SpO2 92.00%.  Filed Weights   07/01/13 0118 07/02/13 0437  Weight: 203 lb 0.7 oz (92.1 kg) 207 lb 14.3 oz (94.3 kg)   Labs  CBC  Recent Labs  06/30/13 2205 07/01/13 0804 07/02/13 0502  WBC 5.9 6.7 7.2  NEUTROABS 3.7  --   --  HGB 14.0 13.3 12.5*  HCT 41.1 40.2 38.3*  MCV 87.6 89.3 89.9  PLT 193 182 188   Basic Metabolic Panel  Recent Labs  07/01/13 0133 07/01/13 0804 07/02/13 0502  NA  --  139 136  K  --  5.2* 4.4  CL  --  106 101  CO2  --  25 25  GLUCOSE  --  90 90  BUN  --  30* 40*  CREATININE  --  1.76* 1.76*  CALCIUM  --  8.9 8.7  MG 2.0  --   --     Cardiac Enzymes  Recent Labs  07/01/13 0133 07/01/13 0804 07/01/13 1246  TROPONINI <0.30 <0.30 <0.30   D-Dimer  Recent Labs  07/01/13 1642  DDIMER 0.72*   Hemoglobin A1C  Recent Labs  07/01/13 0133  HGBA1C 6.1*   Fasting Lipid Panel  Recent Labs  07/01/13 0804  CHOL 208*  HDL 34*  LDLCALC 145*  TRIG 144  CHOLHDL 6.1   Thyroid Function Tests  Recent Labs  07/01/13 0133  TSH 1.729   Disposition  Pt is being discharged home today in good condition.  Follow-up Plans & Appointments  Follow-up Information   Follow up with Alva Garnet., MD. (1-2 wks)    Specialty:  Internal Medicine   Contact information:   [address] [place] [address] [phone]       Follow up with [facility], MD On 07/17/2013. (9:00 AM)    Specialty:  Pulmonary Disease   Contact information:   [address] [place] [address] [phone]       Follow up with Charlton Haws, MD On 07/23/2013. (4:00 PM)    Specialty:  Cardiology   Contact information:   [address] [place] [address] [phone]      Discharge Medications    Medication List         albuterol (2.5 MG/3ML) 0.083% nebulizer solution  Commonly known as:  PROVENTIL  Take 2.5 mg by nebulization 4 (four) times daily.     albuterol 108 (90 BASE) MCG/ACT inhaler  Commonly known as:  PROVENTIL HFA;VENTOLIN HFA  Inhale 2 puffs into the lungs every 6 (six) hours as needed for wheezing or shortness of breath. Shortness of breath     ALPRAZolam 1 MG tablet  Commonly known as:  XANAX  Take 1 mg by mouth 3 (three) times daily as needed for anxiety.     aspirin 81 MG EC tablet  Take 1 tablet (81 mg total) by mouth daily.     COMBIVENT RESPIMAT 20-100 MCG/ACT Aers respimat  Generic drug:  Ipratropium-Albuterol  Inhale 1 puff into the lungs every morning.     doxazosin 8 MG tablet  Commonly known as:  CARDURA  Take 8 mg by mouth at bedtime.      ezetimibe 10 MG tablet  Commonly known as:  ZETIA  Take 1 tablet (10 mg total) by mouth daily.     furosemide 40 MG tablet  Commonly known as:  LASIX  Take 20 mg by mouth every evening.     HYDROcodone-acetaminophen 10-325 MG per tablet  Commonly known as:  NORCO  Take 1 tablet by mouth every 6 (six) hours as needed for moderate pain. For pain.     ipratropium 0.02 % nebulizer solution  Commonly known as:  ATROVENT  Take 2.5 mLs (0.5 mg total) by nebulization 4 (four) times daily.     isosorbide mononitrate 30  MG 24 hr tablet  Commonly known as:  IMDUR  Take 1 tablet (30 mg total) by mouth daily.     metoprolol tartrate 25 MG tablet  Commonly known as:  LOPRESSOR  Take 0.5 tablets (12.5 mg total) by mouth 2 (two) times daily.     NITROSTAT 0.3 MG SL tablet  Generic drug:  nitroGLYCERIN  Place 0.3 mg under the tongue every 5 (five) minutes as needed for chest pain.     pantoprazole 40 MG tablet  Commonly known as:  PROTONIX  Take 1 tablet (40 mg total) by mouth daily.     TRADJENTA 5 MG Tabs tablet  Generic drug:  linagliptin  Take 1 tablet by mouth every morning.     valsartan 80 MG tablet  Commonly known as:  DIOVAN  Take 80 mg by mouth every morning.       Outstanding Labs/Studies  None  Duration of Discharge Encounter   Greater than 30 minutes including physician time.  Signed, Nicolasa Ducking NP 07/02/2013, 10:14 AM  Attending Note:   The patient was seen and examined.  Agree with assessment and plan as noted above.  Changes made to the above note as needed.  See my note from earlier today.     Vesta Mixer, Montez Hageman., MD, [facility] 07/02/2013, 6:13 PM

## 2013-07-02 NOTE — Progress Notes (Signed)
Patient Name: Lucas [name] Date of Encounter: 07/02/2013     Active Problems:   Chest pain    SUBJECTIVE  His chest pain is improved but not entirely gone since he received a shot of morphine.  His neck pain is no longer present.  His rhythm is stable normal sinus rhythm.  Initial cardiac enzymes are normal.  CURRENT MEDS . albuterol  2.5 mg Nebulization Q6H  . aspirin EC  81 mg Oral Daily  . doxazosin  8 mg Oral QHS  . enoxaparin (LOVENOX) injection  40 mg Subcutaneous Q24H  . ezetimibe  10 mg Oral Daily  . ipratropium  0.5 mg Nebulization Q6H  . irbesartan  75 mg Oral Daily  . isosorbide mononitrate  30 mg Oral Daily  . linagliptin  5 mg Oral q morning - 10a  . metoprolol tartrate  12.5 mg Oral BID  . pantoprazole  40 mg Oral Daily    OBJECTIVE  Filed Vitals:   07/01/13 1941 07/01/13 2026 07/01/13 2321 07/02/13 0437  BP:  102/62 110/60 114/61  Pulse:  51 54 53  Temp:  97.6 F (36.4 C)  97.8 F (36.6 C)  TempSrc:  Oral  Oral  Resp:  20  20  Height:      Weight:    207 lb 14.3 oz (94.3 kg)  SpO2: 94% 96%  92%    Intake/Output Summary (Last 24 hours) at 07/02/13 4098 Last data filed at 07/02/13 0240  Gross per 24 hour  Intake    220 ml  Output    775 ml  Net   -555 ml   Filed Weights   07/01/13 0118 07/02/13 0437  Weight: 203 lb 0.7 oz (92.1 kg) 207 lb 14.3 oz (94.3 kg)    PHYSICAL EXAM  General: Pleasant, NAD.  Nasal oxygen in place Neuro: Alert and oriented X 3. Moves all extremities spontaneously. Psych: Normal affect. HEENT:  Normal  Neck: Supple without bruits or JVD. Lungs:  Increased AP diameter and decreased distant breath sounds consistent with COPD Heart: RRR no s3, s4, or murmurs. Abdomen: Soft, non-tender, non-distended, BS + x 4.  Extremities: No clubbing, cyanosis or edema. DP/PT/Radials 2+ and equal bilaterally.  Accessory Clinical Findings  CBC  Recent Labs  06/30/13 2205 07/01/13 0804 07/02/13 0502  WBC 5.9 6.7 7.2    NEUTROABS 3.7  --   --   HGB 14.0 13.3 12.5*  HCT 41.1 40.2 38.3*  MCV 87.6 89.3 89.9  PLT 193 182 188   Basic Metabolic Panel  Recent Labs  07/01/13 0133 07/01/13 0804 07/02/13 0502  NA  --  139 136  K  --  5.2* 4.4  CL  --  106 101  CO2  --  25 25  GLUCOSE  --  90 90  BUN  --  30* 40*  CREATININE  --  1.76* 1.76*  CALCIUM  --  8.9 8.7  MG 2.0  --   --    Liver Function Tests No results found for this basename: AST, ALT, ALKPHOS, BILITOT, PROT, ALBUMIN,  in the last 72 hours No results found for this basename: LIPASE, AMYLASE,  in the last 72 hours Cardiac Enzymes  Recent Labs  07/01/13 0133 07/01/13 0804 07/01/13 1246  TROPONINI <0.30 <0.30 <0.30   BNP No components found with this basename: POCBNP,  D-Dimer  Recent Labs  07/01/13 1642  DDIMER 0.72*   Hemoglobin A1C  Recent Labs  07/01/13 0133  HGBA1C 6.1*  Fasting Lipid Panel  Recent Labs  07/01/13 0804  CHOL 208*  HDL 34*  LDLCALC 145*  TRIG 144  CHOLHDL 6.1   Thyroid Function Tests  Recent Labs  07/01/13 0133  TSH 1.729    TELE  Normal sinus rhythm  ECG  Normal sinus rhythm with PACs.  Old anteroseptal myocardial infarction.  No acute ischemic changes.  Radiology/Studies  Dg Chest Port 1 View  06/30/2013   CLINICAL DATA:  67 year old male with chest pain and shortness of breath.  EXAM: PORTABLE CHEST - 1 VIEW  COMPARISON:  05/24/2013 and prior chest radiographs  FINDINGS: Cardiomegaly and CABG changes noted.  Mild interstitial prominence is noted and could represent very mild interstitial edema.  There is no evidence of focal airspace disease, suspicious pulmonary nodule/mass, pleural effusion, or pneumothorax. No acute bony abnormalities are identified.  IMPRESSION: Mild interstitial prominence - mild interstitial edema not excluded.  Cardiomegaly and CABG changes.   Electronically Signed   By: Laveda Abbe M.D.   On: 06/30/2013 22:19    ASSESSMENT AND PLAN 1. Chest Pain- has  ruled out for MI - negative troponins x 3.   He is currently pain free and is ready to go home.   His d-dimer is minimally elevated and is not significant.  He has no signs or symptoms of pulmonary embolus.  Very comfortable at rest .  2. CAD s/p CABG x 5 (cath 04/21/2012 with patent LIMA--LAD, patent SVG-->OM3; all other grafts are occluded)  3. Oxygen-requiring COPD - cont. O2 at home.  4. HTN  5. Anxiety   6. Chronic systolic CHF:    Echo reveals LVEF of 30-35%.  + diastolic dysfunction, no significant AS, trivial AI.  He is on Avapro. Metoprolol, Imdur.  Will continue to titrate meds as OP.  No significant CHF on exam today.   OK to be discharged on home meds. Follow up with primary md, pulmonary and with Dr. Eden Emms. Will have him ambulate in halls prior to Dc.   Vesta Mixer, Montez Hageman., MD, [facility] 07/02/2013, 7:19 AM Office - [phone] Pager [phone]

## 2013-07-17 ENCOUNTER — Ambulatory Visit (INDEPENDENT_AMBULATORY_CARE_PROVIDER_SITE_OTHER): Payer: Medicare Other | Admitting: Internal Medicine

## 2013-07-17 ENCOUNTER — Encounter: Payer: Self-pay | Admitting: Internal Medicine

## 2013-07-17 VITALS — BP 170/90 | HR 88 | Ht 68.0 in | Wt 213.8 lb

## 2013-07-17 DIAGNOSIS — J441 Chronic obstructive pulmonary disease with (acute) exacerbation: Secondary | ICD-10-CM

## 2013-07-17 DIAGNOSIS — J449 Chronic obstructive pulmonary disease, unspecified: Secondary | ICD-10-CM

## 2013-07-17 MED ORDER — DOXYCYCLINE HYCLATE 100 MG PO TABS: 100.0000 mg | ORAL_TABLET | Freq: Two times a day (BID) | ORAL | Status: DC

## 2013-07-17 MED ORDER — PREDNISONE 10 MG PO TABS: ORAL_TABLET | ORAL | Status: DC

## 2013-07-17 NOTE — Assessment & Plan Note (Signed)
#  COPD Continue combivent mdi 4 times daily Use albuterol 2 puff as needed STOP ANoro because this is not helping you Glad you are starting rehab and uptodate with flu shot Continue oxygen  #FOllowuo  Keep up with sleep study Return to see my NP Tammy for med calendar - next few weeks REturn to see me in 4 months

## 2013-07-17 NOTE — Patient Instructions (Signed)
#  COPD exacerbation  - you are in flare up - Take doxycycline 100mg  po twice daily x 5 days; take after meals and avoid sunlight - Take prednisone 40 mg daily x 2 days, then 20mg  daily x 2 days, then 10mg  daily x 2 days, then 5mg  daily x 2 days and stop  #COPD Continue combivent mdi 4 times daily Use albuterol 2 puff as needed STOP ANoro because this is not helping you Glad you are starting rehab and uptodate with flu shot Continue oxygen  #FOllowuo  Keep up with sleep study Return to see my NP Tammy for med calendar - next few weeks REturn to see me in 4 months

## 2013-07-17 NOTE — Assessment & Plan Note (Signed)
#  COPD exacerbation  - you are in flare up - Take doxycycline 100mg  po twice daily x 5 days; take after meals and avoid sunlight - Take prednisone 40 mg daily x 2 days, then 20mg  daily x 2 days, then 10mg  daily x 2 days, then 5mg  daily x 2 days and stop

## 2013-07-17 NOTE — Progress Notes (Signed)
Subjective:    Patient ID: Lucas [name], male    DOB: 10/31/1945, 68 y.o.   MRN: [id number]  HPI  #Smoking  - quit sept 2014 but having withdrawals  #Moderate COPD Spirometry 04/18/12       06/02/12 FEV1 1.64L 46%             1.94  55%   FVC 2.78L 60%               3.01  65% FEV1/FVC 59                   64   #CHF/CAD - s/p CABG. C - Oct 7035 Systolic CHF admission ef 30% - Sept 0093: Diastolic CHF admission  - DEc 2013: EF 30% Admission for Unstable angiona but welll compensated CHF  #Obesity  - Body mass index is 29.93 kg/(m^2).   #AECOPD  - 03/26/13 - office Rx - 04/03/13 - AECOPD with Acute Diast CHF admission though 04/06/13  #imaging  - 04/03/13 - CHF changes. NEver had CT    OV 07/17/2013    FU smoking and COPD Gold stage II in setting of Obesity, CAD with Systolic CHF in setting of non compliance and poly pharmacy and hx of Prostate CAncer.    reports that he quit smoking about 3 months ago. His smoking use included Cigarettes. He has a 16.2 pack-year smoking history. He quit smokeless tobacco use about 14 months ago.     Chief Complaint  Patient presents with  . HFU    Pt c/o productive cough with green phlegm, sometimes clear. Pt states this is better since discharge but not back to baseline.  Pt has not used Anoro in several weeks. He is also using  combivent and albuterol both.      Regarding COPD: He is intermittently compliant with o2. Not taking ANORO; says does not help. Uses combivent prn + albuterol 3 times a day as scheduled (re-educated him). He was admitted prior to xmas 2014 for chest pain and there is no mention of AECOPD or dc on abx and prednisone but he thinks he was discharged on abx/pred/  ANy event, currently past few days having worsening cough, dyspnea, and 1 day green spoutum. C/p runny nose and increased wheeze but no chest pain or orthopnea or edema. He will start rehab soon.   RE: Smoking: still in remission  Re sleep apnea  workup: has sleep study pending   - . CAT COPD Symptom & Quality of Life Score (GSK trademark) 0 is no burden. 5 is highest burden 03/26/2013  04/23/2013  07/17/2013   Never Cough -> Cough all the time 4 2 3   No phlegm in chest -> Chest is full of phlegm 4 2 3   No chest tightness -> Chest feels very tight 3 2 3   No dyspnea for 1 flight stairs/hill -> Very dyspneic for 1 flight of stairs 4 4 4   No limitations for ADL at home -> Very limited with ADL at home 4 2 3   Confident leaving home -> Not at all confident leaving home 2 2 2   Sleep soundly -> Do not sleep soundly because of lung condition 4 3 3   Lots of Energy -> No energy at all 4 4 4   TOTAL Score (max 40)  29 21 25      Review of Systems  Constitutional: Negative for fever and unexpected weight change.  HENT: Negative for congestion, dental problem, ear pain, nosebleeds, postnasal  drip, rhinorrhea, sinus pressure, sneezing, sore throat and trouble swallowing.   Eyes: Negative for redness and itching.  Respiratory: Positive for cough and shortness of breath. Negative for chest tightness and wheezing.   Cardiovascular: Negative for palpitations and leg swelling.  Gastrointestinal: Negative for nausea and vomiting.  Genitourinary: Negative for dysuria.  Musculoskeletal: Negative for joint swelling.  Skin: Negative for rash.  Neurological: Negative for headaches.  Hematological: Does not bruise/bleed easily.  Psychiatric/Behavioral: Negative for dysphoric mood. The patient is not nervous/anxious.        Objective:   Physical Exam  Filed Vitals:   07/17/13 0859  BP: 170/90  Pulse: 88  Height: 5\' 8"  (1.727 m)  Weight: 213 lb 12.8 oz (96.979 kg)  SpO2: 96%    Constitutional: He is oriented to person, place, and time. He appears well-developed and well-nourished. No distress.  Body mass index is 32.09 kg/(m^2).   HENT:  Head: Normocephalic and atraumatic.  Right Ear: External ear normal.  Left Ear: External ear normal.   Mouth/Throat: Oropharynx is clear and moist. No oropharyngeal exudate.  mallampatti class 4.  Eyes: Conjunctivae and EOM are normal. Pupils are equal, round, and reactive to light. Right eye exhibits no discharge. Left eye exhibits no discharge. No scleral icterus.  Neck: Normal range of motion. Neck supple. No JVD present. No tracheal deviation present. No thyromegaly present.  Cardiovascular: Normal rate, regular rhythm and intact distal pulses.  Exam reveals no gallop and no friction rub.   No murmur heard. Pulmonary/Chest: Effort normal. No respiratory distress. He has wheezes + bilaterally at base He has no rales. He exhibits no tenderness.  Abdominal: Soft. Bowel sounds are normal. He exhibits no distension and no mass. There is no tenderness. There is no rebound and no guarding.  Musculoskeletal: Normal range of motion. He exhibits no edema and no tenderness.  Lymphadenopathy:    He has no cervical adenopathy.  Neurological: He is alert and oriented to person, place, and time. He has normal reflexes. No cranial nerve deficit. Coordination normal.      Assessment & Plan:

## 2013-07-19 ENCOUNTER — Ambulatory Visit (HOSPITAL_BASED_OUTPATIENT_CLINIC_OR_DEPARTMENT_OTHER): Payer: Medicare Other | Attending: Pulmonary Disease | Admitting: Radiology

## 2013-07-19 ENCOUNTER — Telehealth: Payer: Self-pay | Admitting: Internal Medicine

## 2013-07-19 ENCOUNTER — Encounter (HOSPITAL_COMMUNITY)
Admission: RE | Admit: 2013-07-19 | Discharge: 2013-07-19 | Disposition: A | Discharge status: Home / Self Care | Payer: Medicare Other | Source: Ambulatory Visit | Attending: Internal Medicine | Admitting: Internal Medicine

## 2013-07-19 VITALS — Ht 69.0 in | Wt 200.0 lb

## 2013-07-19 DIAGNOSIS — G4700 Insomnia, unspecified: Secondary | ICD-10-CM | POA: Insufficient documentation

## 2013-07-19 DIAGNOSIS — Z6830 Body mass index (BMI) 30.0-30.9, adult: Secondary | ICD-10-CM | POA: Insufficient documentation

## 2013-07-19 DIAGNOSIS — R0609 Other forms of dyspnea: Secondary | ICD-10-CM | POA: Insufficient documentation

## 2013-07-19 DIAGNOSIS — G4733 Obstructive sleep apnea (adult) (pediatric): Secondary | ICD-10-CM

## 2013-07-19 DIAGNOSIS — E669 Obesity, unspecified: Secondary | ICD-10-CM | POA: Insufficient documentation

## 2013-07-19 DIAGNOSIS — J449 Chronic obstructive pulmonary disease, unspecified: Secondary | ICD-10-CM | POA: Insufficient documentation

## 2013-07-19 DIAGNOSIS — Z87891 Personal history of nicotine dependence: Secondary | ICD-10-CM | POA: Insufficient documentation

## 2013-07-19 DIAGNOSIS — J4489 Other specified chronic obstructive pulmonary disease: Secondary | ICD-10-CM | POA: Insufficient documentation

## 2013-07-19 DIAGNOSIS — I509 Heart failure, unspecified: Secondary | ICD-10-CM | POA: Diagnosis not present

## 2013-07-19 DIAGNOSIS — F17200 Nicotine dependence, unspecified, uncomplicated: Secondary | ICD-10-CM | POA: Diagnosis not present

## 2013-07-19 DIAGNOSIS — I5020 Unspecified systolic (congestive) heart failure: Secondary | ICD-10-CM | POA: Diagnosis not present

## 2013-07-19 DIAGNOSIS — I5042 Chronic combined systolic (congestive) and diastolic (congestive) heart failure: Secondary | ICD-10-CM | POA: Insufficient documentation

## 2013-07-19 DIAGNOSIS — I2510 Atherosclerotic heart disease of native coronary artery without angina pectoris: Secondary | ICD-10-CM | POA: Insufficient documentation

## 2013-07-19 DIAGNOSIS — G4730 Sleep apnea, unspecified: Secondary | ICD-10-CM | POA: Diagnosis present

## 2013-07-19 DIAGNOSIS — R0989 Other specified symptoms and signs involving the circulatory and respiratory systems: Secondary | ICD-10-CM | POA: Insufficient documentation

## 2013-07-19 DIAGNOSIS — Z8546 Personal history of malignant neoplasm of prostate: Secondary | ICD-10-CM | POA: Diagnosis not present

## 2013-07-19 DIAGNOSIS — Z5189 Encounter for other specified aftercare: Secondary | ICD-10-CM | POA: Insufficient documentation

## 2013-07-19 DIAGNOSIS — Z951 Presence of aortocoronary bypass graft: Secondary | ICD-10-CM | POA: Insufficient documentation

## 2013-07-19 NOTE — Telephone Encounter (Signed)
Spoke with pt. He reports he went to rehab this AM and his BP. He thinks his BP was 190/90. His BS was not checked. His HR was 90. He was told it was bc he was currently taking prednisone. Pt reports he feels fine right now and having no symptoms currently. He reports he will back if he has any problems and will recheck his BP to make sure it is back to normal. Nothing further needed

## 2013-07-23 ENCOUNTER — Encounter: Payer: PRIVATE HEALTH INSURANCE | Admitting: Cardiovascular Disease

## 2013-07-24 ENCOUNTER — Telehealth: Payer: Self-pay | Admitting: Pulmonary Disease

## 2013-07-24 ENCOUNTER — Encounter (HOSPITAL_COMMUNITY)
Admission: RE | Admit: 2013-07-24 | Discharge: 2013-07-24 | Disposition: A | Discharge status: Home / Self Care | Payer: Medicare Other | Source: Ambulatory Visit | Attending: Internal Medicine | Admitting: Internal Medicine

## 2013-07-24 DIAGNOSIS — G4733 Obstructive sleep apnea (adult) (pediatric): Secondary | ICD-10-CM

## 2013-07-24 NOTE — Telephone Encounter (Signed)
No OSA on sleep study 1L oxygen during sleep is adequate

## 2013-07-24 NOTE — Telephone Encounter (Signed)
I spoke with patient about results and he verbalized understanding and had no questions 

## 2013-07-24 NOTE — Sleep Study (Signed)
[place]   NAME: Lucas [name]  DATE OF BIRTH: 1946-02-26  MEDICAL RECORD [id number]  LOCATION: [facility]   PHYSICIAN: Clemens Lachman V.   DATE OF STUDY: 07/19/13   SLEEP STUDY TYPE: Nocturnal Polysomnogram   REFERRING PHYSICIAN: Rigoberto Noel, MD   INDICATION FOR STUDY:  68 year old smoker referred for evaluation of insomnia and sleep apnea. He has COPD Gold stage II in setting of Obesity, CAD with Systolic CHF and hx of Prostate Cancer.Loud snoring has been noted by family members. He uses 2 L of oxygen during sleep for the last 3 months. He reports non refreshing sleep.  At the time of this study ,they weighed 200 pounds with a height of 5 ft 9 inches and the BMI of 30, neck size of 19 inches. Epworth sleepiness score was 6   This nocturnal polysomnogram was performed with a sleep technologist in attendance. EEG, EOG,EMG and respiratory parameters recorded. Sleep stages, arousals, limb movements and respiratory data was scored according to criteria laid out by the American Academy of sleep medicine.   SLEEP ARCHITECTURE: Lights out was at 2200 PM and lights on was at 4-32 AM. Total sleep time was 195 minutes with a sleep period time of 341 minutes and a sleep efficiency of 50 %. Sleep latency was 8 minutes with latency to REM sleep of 216 minutes and wake after sleep onset of 188 minutes. . Sleep stages as a percentage of total sleep time was N1 -25 %,N2- 70 % and REM sleep 5 % ( 10 minutes) . The longest period of REM sleep was around 3 AM.   AROUSAL DATA : There were 77  arousals with an arousal index of 23 events per hour. Most of these were spontaneous & 0 were associated with respiratory events  RESPIRATORY DATA: There were 0 obstructive apneas, 0 central apneas, 0 mixed apneas and 0 hypopneas with apnea -hypopnea index of 0 events per hour. There were 7 RERAs with an RDI of 2  events per hour. There was no relation to sleep stage  or body position. Supine sleep was not noted  MOVEMENT/PARASOMNIA: There were 0 PLMS with a PLM index of 0 events per hour. The PLM arousal index was 0 per hour.  OXYGEN DATA: The lowest desaturation was 87 % during NREM sleep and the desaturation index was 2 per hour. He slept with 1L O2 & spent 1.6 mins with satn < 88%  CARDIAC DATA: The low heart rate was 31 beats per minute. The high heart rate recorded was an artifact. No arrhythmias were noted   DISCUSSION -Loud snoring was noted . He did not meet criteria for CPAP intervention. He was desensitized with a medium full face mask  IMPRESSION :  1. No evidence of obstructive sleep apnea or significant oxygen desaturation.  2. No evidence of cardiac arrhythmias,periodic limb movements or behavioral disturbance during sleep.  3. Sleep efficiency was poor.  RECOMMENDATION:  1. Treatment options include cognitive -behaviour therapy or pharmacotherapy for insomnia. Weight loss should help with snoring. 2. Patient should be cautioned against driving when sleepy  3. They should be asked to avoid medications with sedative side effects  4. 1 L oxygen appears adequate during sleep   Lorinda Copland V.  Diplomate, [employer] of Sleep Medicine    ELECTRONICALLY SIGNED ON: 07/24/2013  [facility]  PH: [phone] FX: [phone]  [place]

## 2013-07-26 ENCOUNTER — Encounter (HOSPITAL_COMMUNITY)
Admission: RE | Admit: 2013-07-26 | Discharge: 2013-07-26 | Disposition: A | Discharge status: Home / Self Care | Payer: Medicare Other | Source: Ambulatory Visit | Attending: Internal Medicine | Admitting: Internal Medicine

## 2013-07-31 ENCOUNTER — Encounter (HOSPITAL_COMMUNITY): Payer: Medicare Other

## 2013-08-02 ENCOUNTER — Ambulatory Visit (INDEPENDENT_AMBULATORY_CARE_PROVIDER_SITE_OTHER)
Admission: RE | Admit: 2013-08-02 | Discharge: 2013-08-02 | Disposition: A | Discharge status: Home / Self Care | Payer: Medicare Other | Source: Ambulatory Visit | Attending: Pulmonary Disease | Admitting: Pulmonary Disease

## 2013-08-02 ENCOUNTER — Encounter: Payer: Self-pay | Admitting: Pulmonary Disease

## 2013-08-02 ENCOUNTER — Encounter (HOSPITAL_COMMUNITY)
Admission: RE | Admit: 2013-08-02 | Discharge: 2013-08-02 | Disposition: A | Discharge status: Home / Self Care | Payer: Medicare Other | Source: Ambulatory Visit | Attending: Internal Medicine | Admitting: Internal Medicine

## 2013-08-02 ENCOUNTER — Telehealth: Payer: Self-pay | Admitting: Internal Medicine

## 2013-08-02 ENCOUNTER — Other Ambulatory Visit (INDEPENDENT_AMBULATORY_CARE_PROVIDER_SITE_OTHER): Payer: Medicare Other

## 2013-08-02 ENCOUNTER — Ambulatory Visit (INDEPENDENT_AMBULATORY_CARE_PROVIDER_SITE_OTHER): Payer: PRIVATE HEALTH INSURANCE | Admitting: Pulmonary Disease

## 2013-08-02 VITALS — BP 134/68 | HR 81 | Temp 97.4°F | Ht 68.0 in | Wt 212.0 lb

## 2013-08-02 DIAGNOSIS — I509 Heart failure, unspecified: Secondary | ICD-10-CM

## 2013-08-02 DIAGNOSIS — I255 Ischemic cardiomyopathy: Secondary | ICD-10-CM

## 2013-08-02 DIAGNOSIS — J441 Chronic obstructive pulmonary disease with (acute) exacerbation: Secondary | ICD-10-CM

## 2013-08-02 DIAGNOSIS — I2589 Other forms of chronic ischemic heart disease: Secondary | ICD-10-CM

## 2013-08-02 LAB — BASIC METABOLIC PANEL
BUN: 21 mg/dL (ref 6–23)
CO2: 28 mEq/L (ref 19–32)
CREATININE: 1.3 mg/dL (ref 0.4–1.5)
Calcium: 9.3 mg/dL (ref 8.4–10.5)
Chloride: 107 mEq/L (ref 96–112)
GFR: 59.37 mL/min — AB (ref >60.00)
GLUCOSE: 106 mg/dL — AB (ref 70–99)
POTASSIUM: 3.8 meq/L (ref 3.5–5.1)
Sodium: 141 mEq/L (ref 135–145)

## 2013-08-02 LAB — EKG 12-LEAD

## 2013-08-02 MED ORDER — PREDNISONE 10 MG PO TABS: ORAL_TABLET | ORAL | Status: DC

## 2013-08-02 NOTE — Assessment & Plan Note (Signed)
He is volume up on exam today and this is contributing to his dyspnea.  Plan: -double dose of lasix for 2 days, then resume normal dose until 1/26 when sees Cardiology -Bmet today to check renal function and potassium

## 2013-08-02 NOTE — Progress Notes (Signed)
Subjective:    Patient ID: Lucas [name], male    DOB: 12-29-1945, 68 y.o.   MRN: [id number]  HPI  08/02/2013 ROV > MR patient with severe COPD, CHF;  He has been coughing more this week and having more shortness of breath and weakness.  No fevers or chills.  He has some clear sputum production.  No headache, nausea or vomiting.  No sinus symptoms.  He intially thought this morinng that he was having heart trouble because he had chest tightness.  He is also having calf pain when he walks. He is supposed to see cardiology next week.    Past Medical History  Diagnosis Date  . CAD (coronary artery disease)     a. s/p CABG x 5;  b. 04/2012 Cath: patent LIMA->LAD and VG->OM3, all other grafts occluded.  . Hypertension   . Anxiety   . History of kidney cancer   . ADENOCARCINOMA, PROSTATE   . Adjustment disorder with anxiety   . Hyperlipidemia   . BPH (benign prostatic hypertrophy)   . COPD (chronic obstructive pulmonary disease)     a. on home O2.  . Arthritis   . Ischemic cardiomyopathy     a. 06/2013 Echo: EF 30-35%, no reg wma's, Gr2 DD, triv AI, mod dil LA, nl RV fxn.  . Chronic systolic CHF (congestive heart failure)     a. 06/2013 Echo: EF 30-35%.     Review of Systems     Objective:   Physical Exam Filed Vitals:   08/02/13 1348  BP: 134/68  Pulse: 81  Temp: 97.4 F (36.3 C)  TempSrc: Oral  Height: 5\' 8"  (1.727 m)  Weight: 212 lb (96.163 kg)  SpO2: 94%   2L   Gen: chronically ill appearing HEENT: NCAT, EOMi PULM: Wheezing bilaterally, very few crackles in bases CV: RRR, no mgr Ab: BS+, soft, nonteder Ext: pitting edema to mid shin       Assessment & Plan:   Ischemic cardiomyopathy He is volume up on exam today and this is contributing to his dyspnea.  Plan: -double dose of lasix for 2 days, then resume normal dose until 1/26 when sees Cardiology -Bmet today to check renal function and potassium  COPD exacerbation He has yet another COPD  exacerbation without clear infectious symptoms.  Plan: -CXR 2 view  -add Breo -prednisone taper -continue combivent    Updated Medication List Outpatient Encounter Prescriptions as of 08/02/2013  Medication Sig  . albuterol (PROVENTIL HFA;VENTOLIN HFA) 108 (90 BASE) MCG/ACT inhaler Inhale 2 puffs into the lungs every 6 (six) hours as needed for wheezing or shortness of breath. Shortness of breath  . albuterol (PROVENTIL) (2.5 MG/3ML) 0.083% nebulizer solution Take 2.5 mg by nebulization 4 (four) times daily.  Marland Kitchen ALPRAZolam (XANAX) 1 MG tablet Take 1 mg by mouth 3 (three) times daily as needed for anxiety.  Marland Kitchen aspirin EC 81 MG EC tablet Take 1 tablet (81 mg total) by mouth daily.  . COMBIVENT RESPIMAT 20-100 MCG/ACT AERS respimat Inhale 1 puff into the lungs every 6 (six) hours as needed.   . doxazosin (CARDURA) 8 MG tablet Take 8 mg by mouth at bedtime.   . furosemide (LASIX) 40 MG tablet Take 20 mg by mouth every evening.  Marland Kitchen HYDROcodone-acetaminophen (NORCO) 10-325 MG per tablet Take 1 tablet by mouth every 6 (six) hours as needed for moderate pain. For pain.  Marland Kitchen ipratropium (ATROVENT) 0.02 % nebulizer solution Take 2.5 mLs (0.5 mg total) by nebulization  4 (four) times daily.  Marland Kitchen NITROSTAT 0.3 MG SL tablet Place 0.3 mg under the tongue every 5 (five) minutes as needed for chest pain.   . valsartan (DIOVAN) 80 MG tablet Take 80 mg by mouth every morning.  . TRADJENTA 5 MG TABS tablet Take 1 tablet by mouth every morning.  . [DISCONTINUED] doxycycline (VIBRA-TABS) 100 MG tablet Take 1 tablet (100 mg total) by mouth 2 (two) times daily.  . [DISCONTINUED] ezetimibe (ZETIA) 10 MG tablet Take 1 tablet (10 mg total) by mouth daily.  . [DISCONTINUED] isosorbide mononitrate (IMDUR) 30 MG 24 hr tablet Take 1 tablet (30 mg total) by mouth daily.  . [DISCONTINUED] metoprolol tartrate (LOPRESSOR) 25 MG tablet Take 0.5 tablets (12.5 mg total) by mouth 2 (two) times daily.  . [DISCONTINUED] pantoprazole  (PROTONIX) 40 MG tablet Take 1 tablet (40 mg total) by mouth daily.  . [DISCONTINUED] predniSONE (DELTASONE) 10 MG tablet Take 4 tabs daily x 2 days, 2 tabs daily x 2, 1 tab x 2 days, then 1/2 tab x 2 days, then stop

## 2013-08-02 NOTE — Telephone Encounter (Signed)
Spoke with pt. He is scheduled to come in and see BQ this afternoon for acute visit. Nothing further needed

## 2013-08-02 NOTE — Assessment & Plan Note (Addendum)
He has yet another COPD exacerbation without clear infectious symptoms.  Plan: -CXR 2 view  -add Breo -prednisone taper -continue combivent

## 2013-08-02 NOTE — Patient Instructions (Signed)
Go get a chest x-ray now We will call you with your blood results Take an extra lasix starting tomorrow morning (take 40mg  twice a day, morning and afternoon) for two days After two days, go back to taking 20mg  lasix daily  Take the prednisone taper as written Take the Breo on puff daily  We will have you follow up with Dr. Chase Caller in 3-4 weeks or sooner if needed

## 2013-08-06 ENCOUNTER — Encounter: Payer: Self-pay | Admitting: *Deleted

## 2013-08-06 ENCOUNTER — Ambulatory Visit (INDEPENDENT_AMBULATORY_CARE_PROVIDER_SITE_OTHER): Payer: PRIVATE HEALTH INSURANCE | Admitting: Cardiovascular Disease

## 2013-08-06 ENCOUNTER — Telehealth: Payer: Self-pay | Admitting: Cardiovascular Disease

## 2013-08-06 ENCOUNTER — Telehealth: Payer: Self-pay

## 2013-08-06 ENCOUNTER — Encounter: Payer: Self-pay | Admitting: Cardiovascular Disease

## 2013-08-06 ENCOUNTER — Other Ambulatory Visit (HOSPITAL_COMMUNITY): Payer: Self-pay | Admitting: Cardiology

## 2013-08-06 VITALS — BP 168/80 | HR 82 | Wt 211.0 lb

## 2013-08-06 DIAGNOSIS — R609 Edema, unspecified: Secondary | ICD-10-CM

## 2013-08-06 DIAGNOSIS — I2510 Atherosclerotic heart disease of native coronary artery without angina pectoris: Secondary | ICD-10-CM

## 2013-08-06 DIAGNOSIS — I255 Ischemic cardiomyopathy: Secondary | ICD-10-CM

## 2013-08-06 DIAGNOSIS — I2589 Other forms of chronic ischemic heart disease: Secondary | ICD-10-CM

## 2013-08-06 DIAGNOSIS — F4322 Adjustment disorder with anxiety: Secondary | ICD-10-CM

## 2013-08-06 DIAGNOSIS — Z01810 Encounter for preprocedural cardiovascular examination: Secondary | ICD-10-CM

## 2013-08-06 DIAGNOSIS — I10 Essential (primary) hypertension: Secondary | ICD-10-CM

## 2013-08-06 DIAGNOSIS — I739 Peripheral vascular disease, unspecified: Secondary | ICD-10-CM

## 2013-08-06 DIAGNOSIS — R0602 Shortness of breath: Secondary | ICD-10-CM

## 2013-08-06 DIAGNOSIS — I70219 Atherosclerosis of native arteries of extremities with intermittent claudication, unspecified extremity: Secondary | ICD-10-CM

## 2013-08-06 DIAGNOSIS — J441 Chronic obstructive pulmonary disease with (acute) exacerbation: Secondary | ICD-10-CM

## 2013-08-06 NOTE — Telephone Encounter (Signed)
Pt aware, nothing further needed.  ?

## 2013-08-06 NOTE — Assessment & Plan Note (Signed)
Still wheezing Wears continuous oxygen  F/U pulmonary Taper steroids  I think that his COPD would preclude redo CABG if needed.  Right heart cath will help me decipher how much of his dyspnea is pulmonary vs cardiac

## 2013-08-06 NOTE — Assessment & Plan Note (Signed)
Not clear what has ppt decrease in EF  Clinically he has had both worsening of COPD and some CHF.  Meds have been adjusted  Recent CXR with no overt CHF  Check BMET and BNP next week  Plan right and left heart cath in 2-3 weeks once on stable medical Rx and hopefully off steriods.  Discussed daily weights and sliding scale lasix with patient

## 2013-08-06 NOTE — Assessment & Plan Note (Signed)
Well controlled.  Continue current medications and low sodium Dash type diet.    

## 2013-08-06 NOTE — Progress Notes (Signed)
Patient ID: Lucas [name], male   DOB: August 29, 1945, 68 y.o.   MRN: [id number] 68 y/o male with h/o CAD s/p prior CABG x 5, hypertension, hyperlipidemia, and supplemental oxygen requiring COPD.04/21/2012 which showed LVEF 45%, patent LIMA-->LAD, patent SVG-->OM3 and chronic occlusion of other grafts  Addmited 12/20 with SSCPR/O Echo with EF 35%   Echo 04/04/13 suggested EF 40-45% New finding  Beta Blocker added to ARB  Seen by pulmonary recently for increased dyspnea  CXR 08/02/13 NAD Placed on prednisone And inhalers  Pro BNP 742  Cr has been up to 1.7 with normal K   Lasix now 20 mg daily RLE pain with ambulation ? Claudication    IMPRESSION: 1. Lung hyperexpansion and bronchitic change without acute cardiopulmonary disease. 2. Cardiomegaly and pulmonary venous congestion without definite evidence of edema  Since d/c has been on steroid taper.  Lasix dose decreased by pulmonary Dyspnea improved   ROS: Denies fever, malais, weight loss, blurry vision, decreased visual acuity, cough, sputum, SOB, hemoptysis, pleuritic pain, palpitaitons, heartburn, abdominal pain, melena, lower extremity edema, claudication, or rash.  All other systems reviewed and negative  General: Affect appropriate Overweight white male   HEENT: normal Neck supple with no adenopathy JVP normal no bruits no thyromegaly Lungs clear with expitory  wheezing and good diaphragmatic motion Heart:  S1/S2 no murmur, no rub, gallop or click PMI normal Abdomen: benighn, BS positve, no tenderness, no AAA no bruit.  No HSM or HJR Distal pulses intact with no bruits No edema Neuro non-focal Skin warm and dry No muscular weakness   Current Outpatient Prescriptions  Medication Sig Dispense Refill  . albuterol (PROVENTIL HFA;VENTOLIN HFA) 108 (90 BASE) MCG/ACT inhaler Inhale 2 puffs into the lungs every 6 (six) hours as needed for wheezing or shortness of breath. Shortness of breath      . albuterol (PROVENTIL) (2.5 MG/3ML)  0.083% nebulizer solution Take 2.5 mg by nebulization 4 (four) times daily.      Marland Kitchen ALPRAZolam (XANAX) 1 MG tablet Take 1 mg by mouth 3 (three) times daily as needed for anxiety.      Marland Kitchen aspirin EC 81 MG EC tablet Take 1 tablet (81 mg total) by mouth daily.      . COMBIVENT RESPIMAT 20-100 MCG/ACT AERS respimat Inhale 1 puff into the lungs every 6 (six) hours as needed.       . doxazosin (CARDURA) 8 MG tablet Take 8 mg by mouth at bedtime.       . furosemide (LASIX) 40 MG tablet Take 20 mg by mouth every evening.      Marland Kitchen HYDROcodone-acetaminophen (NORCO) 10-325 MG per tablet Take 1 tablet by mouth every 6 (six) hours as needed for moderate pain. For pain.      Marland Kitchen ipratropium (ATROVENT) 0.02 % nebulizer solution Take 2.5 mLs (0.5 mg total) by nebulization 4 (four) times daily.  75 mL  12  . NITROSTAT 0.3 MG SL tablet Place 0.3 mg under the tongue every 5 (five) minutes as needed for chest pain.       . predniSONE (DELTASONE) 10 MG tablet 20mg  daily for 4 days, then 10mg  daily for 4 days  12 tablet  0  . TRADJENTA 5 MG TABS tablet Take 1 tablet by mouth as needed.       . valsartan (DIOVAN) 80 MG tablet Take 80 mg by mouth every morning.       No current facility-administered medications for this visit.    Allergies  Ace inhibitors; Ativan; Morphine and related; and Ibuprofen  Electrocardiogram:  SR ? Old anterior MI  Q in lead 3   Assessment and Plan

## 2013-08-06 NOTE — Assessment & Plan Note (Signed)
Stable continue current antidepressant and PRN valium

## 2013-08-06 NOTE — Assessment & Plan Note (Signed)
No chest pain but dyspnea and decreased EF with no onset clinical CHF  Right and left heart cath scheduled for 2/10  Orders written and lab called Risks discussed patient willing to proceed With right heart and grafts will be from fermoral artery and vein

## 2013-08-06 NOTE — Patient Instructions (Signed)
Your physician has requested that you have a cardiac catheterization. Cardiac catheterization is used to diagnose and/or treat various heart conditions. Doctors may recommend this procedure for a number of different reasons. The most common reason is to evaluate chest pain. Chest pain can be a symptom of coronary artery disease (CAD), and cardiac catheterization can show whether plaque is narrowing or blocking your heart's arteries. This procedure is also used to evaluate the valves, as well as measure the blood flow and oxygen levels in different parts of your heart. For further information please visit [email or web address]. Please follow instruction sheet, as given. 08-21-13 WITH  DR [facility] Your physician recommends that you continue on your current medications as directed. Please refer to the Current Medication list given to you today.  Your physician has requested that you have a lower extremity arterial exercise duplex. During this test, exercise and ultrasound are used to evaluate arterial blood flow in the legs. Allow one hour for this exam. There are no restrictions or special instructions. WITH  ABI'S  NEEDS DONE  BEFORE  CATH  Your physician recommends that you return for lab work in: BMET  BNP  CBC   INR   08-14-13

## 2013-08-06 NOTE — Telephone Encounter (Signed)
New problem:  Pt states he hasn't found anyone to spend the night with him on 2/10.Marland Kitchen Pt states he was told to find someone to stay the night with him the night of his procedure.. Pt would like a call back from [place].

## 2013-08-06 NOTE — Telephone Encounter (Signed)
Message copied by Len Blalock on Mon Aug 06, 2013 10:43 AM ------      Message from: Juanito Doom      Created: Fri Aug 03, 2013  7:43 PM       A,      Please let him know this was normal            Thanks      B ------

## 2013-08-06 NOTE — Telephone Encounter (Signed)
PT  WOULD  LIKE TO  STAY  THE NIGHT   IF POSSIBLE  AFTER CATH  IS  UNABLE  TO  HAVE  SOMEONE  STAY WITH HIM  FOR THE 24  HOUR PERIOD  WILL FORWARD TO  DR NISHAN./CY

## 2013-08-07 ENCOUNTER — Other Ambulatory Visit: Payer: Self-pay | Admitting: Cardiovascular Disease

## 2013-08-07 ENCOUNTER — Encounter (HOSPITAL_COMMUNITY): Payer: Self-pay | Admitting: Pharmacy Technician

## 2013-08-07 ENCOUNTER — Encounter (HOSPITAL_COMMUNITY): Payer: Medicare Other

## 2013-08-07 DIAGNOSIS — I2510 Atherosclerotic heart disease of native coronary artery without angina pectoris: Secondary | ICD-10-CM

## 2013-08-07 NOTE — Telephone Encounter (Signed)
Cant promise him he can spend the night

## 2013-08-08 ENCOUNTER — Ambulatory Visit (HOSPITAL_COMMUNITY): Payer: Medicare Other | Attending: Cardiovascular Disease

## 2013-08-08 DIAGNOSIS — I70219 Atherosclerosis of native arteries of extremities with intermittent claudication, unspecified extremity: Secondary | ICD-10-CM

## 2013-08-08 DIAGNOSIS — I10 Essential (primary) hypertension: Secondary | ICD-10-CM | POA: Insufficient documentation

## 2013-08-08 DIAGNOSIS — J449 Chronic obstructive pulmonary disease, unspecified: Secondary | ICD-10-CM | POA: Insufficient documentation

## 2013-08-08 DIAGNOSIS — E785 Hyperlipidemia, unspecified: Secondary | ICD-10-CM | POA: Insufficient documentation

## 2013-08-08 DIAGNOSIS — E1159 Type 2 diabetes mellitus with other circulatory complications: Secondary | ICD-10-CM

## 2013-08-08 DIAGNOSIS — J4489 Other specified chronic obstructive pulmonary disease: Secondary | ICD-10-CM | POA: Insufficient documentation

## 2013-08-08 DIAGNOSIS — I70209 Unspecified atherosclerosis of native arteries of extremities, unspecified extremity: Secondary | ICD-10-CM | POA: Insufficient documentation

## 2013-08-08 DIAGNOSIS — I2510 Atherosclerotic heart disease of native coronary artery without angina pectoris: Secondary | ICD-10-CM | POA: Insufficient documentation

## 2013-08-08 DIAGNOSIS — E119 Type 2 diabetes mellitus without complications: Secondary | ICD-10-CM | POA: Insufficient documentation

## 2013-08-08 DIAGNOSIS — Z87891 Personal history of nicotine dependence: Secondary | ICD-10-CM | POA: Insufficient documentation

## 2013-08-08 DIAGNOSIS — I739 Peripheral vascular disease, unspecified: Secondary | ICD-10-CM

## 2013-08-09 ENCOUNTER — Encounter (HOSPITAL_COMMUNITY): Payer: Medicare Other

## 2013-08-10 NOTE — Telephone Encounter (Signed)
F/u ° ° °Pt returning your call °

## 2013-08-10 NOTE — Telephone Encounter (Signed)
PT  NOTIFIED ./CY 

## 2013-08-10 NOTE — Telephone Encounter (Signed)
LMTCB ./CY 

## 2013-08-14 ENCOUNTER — Encounter (HOSPITAL_COMMUNITY): Payer: Medicare Other

## 2013-08-15 ENCOUNTER — Encounter: Payer: Self-pay | Admitting: Cardiovascular Disease

## 2013-08-15 ENCOUNTER — Ambulatory Visit (INDEPENDENT_AMBULATORY_CARE_PROVIDER_SITE_OTHER): Payer: PRIVATE HEALTH INSURANCE | Admitting: Cardiovascular Disease

## 2013-08-15 VITALS — BP 160/78 | HR 74 | Ht 68.0 in | Wt 213.0 lb

## 2013-08-15 DIAGNOSIS — R609 Edema, unspecified: Secondary | ICD-10-CM

## 2013-08-15 DIAGNOSIS — R0602 Shortness of breath: Secondary | ICD-10-CM

## 2013-08-15 DIAGNOSIS — Z01810 Encounter for preprocedural cardiovascular examination: Secondary | ICD-10-CM

## 2013-08-15 LAB — CBC WITH DIFFERENTIAL/PLATELET
BASOS PCT: 0.3 % (ref 0.0–3.0)
Basophils Absolute: 0 10*3/uL (ref 0.0–0.1)
Eosinophils Absolute: 0.3 10*3/uL (ref 0.0–0.7)
Eosinophils Relative: 3.9 % (ref 0.0–5.0)
HCT: 42.9 % (ref 39.0–52.0)
HEMOGLOBIN: 14.3 g/dL (ref 13.0–17.0)
Lymphocytes Relative: 14.6 % (ref 12.0–46.0)
Lymphs Abs: 1.2 10*3/uL (ref 0.7–4.0)
MCHC: 33.4 g/dL (ref 30.0–36.0)
MCV: 89.6 fl (ref 78.0–100.0)
MONOS PCT: 10.1 % (ref 3.0–12.0)
Monocytes Absolute: 0.9 10*3/uL (ref 0.1–1.0)
NEUTROS ABS: 6 10*3/uL (ref 1.4–7.7)
Neutrophils Relative %: 71.1 % (ref 43.0–77.0)
Platelets: 177 10*3/uL (ref 150.0–400.0)
RBC: 4.79 Mil/uL (ref 4.22–5.81)
RDW: 16.4 % — AB (ref 11.5–14.6)
WBC: 8.4 10*3/uL (ref 4.5–10.5)

## 2013-08-15 LAB — BASIC METABOLIC PANEL
BUN: 21 mg/dL (ref 6–23)
CALCIUM: 9.6 mg/dL (ref 8.4–10.5)
CHLORIDE: 104 meq/L (ref 96–112)
CO2: 26 mEq/L (ref 19–32)
CREATININE: 1.2 mg/dL (ref 0.4–1.5)
GFR: 66.51 mL/min (ref >60.00)
Glucose, Bld: 102 mg/dL — ABNORMAL HIGH (ref 70–99)
Potassium: 4 mEq/L (ref 3.5–5.1)
Sodium: 138 mEq/L (ref 135–145)

## 2013-08-15 LAB — PROTIME-INR
INR: 1.2 ratio — ABNORMAL HIGH (ref 0.8–1.0)
Prothrombin Time: 12.8 s — ABNORMAL HIGH (ref 10.2–12.4)

## 2013-08-15 LAB — BRAIN NATRIURETIC PEPTIDE: Pro B Natriuretic peptide (BNP): 390 pg/mL — ABNORMAL HIGH (ref 0.0–100.0)

## 2013-08-15 NOTE — Patient Instructions (Signed)
Your physician recommends that you get lab work today in preparation for your heart cath  Your physician recommends that you keep your follow-up appointment with Dr. Johnsie Cancel in 6 months

## 2013-08-15 NOTE — Progress Notes (Signed)
HPI:  68 year-old male with coronary disease s/p CABG, O2-dependent COPD, HTN, hyperlipidemia, referred by Dr Johnsie Cancel for evaluation of lower extremity PAD.  He describes 3 months of right calf pain with exertion, resolving after several minutes of rest. Also complains of pain in the medial thigh. He denies ulceration/nonhealing on the right foot. Has no left-sided symptoms. No symptoms with standing. He is equally limited by shortness of breath. He is a longtime smoker, quit in November 2014.  He had recent ABI's with values of 0.48 on the right and 0.88 on the left. The right SFA is occluded, and there are blunted femoral waveforms raising question of proximal disease. The left SFA had nonobstructive stenosis.    The patient is going to have a heart cath next week to evaluate progressive decline in LV function and SHF symptoms.  Outpatient Encounter Prescriptions as of 08/15/2013  Medication Sig  . albuterol (PROVENTIL HFA;VENTOLIN HFA) 108 (90 BASE) MCG/ACT inhaler Inhale 2 puffs into the lungs every 6 (six) hours as needed for wheezing or shortness of breath. Shortness of breath  . albuterol (PROVENTIL) (2.5 MG/3ML) 0.083% nebulizer solution Take 2.5 mg by nebulization 4 (four) times daily.  Marland Kitchen ALPRAZolam (XANAX) 1 MG tablet Take 1 mg by mouth 3 (three) times daily as needed for anxiety.  Marland Kitchen aspirin EC 81 MG EC tablet Take 1 tablet (81 mg total) by mouth daily.  . COMBIVENT RESPIMAT 20-100 MCG/ACT AERS respimat Inhale 1 puff into the lungs every 6 (six) hours as needed for wheezing or shortness of breath.   . doxazosin (CARDURA) 8 MG tablet Take 8 mg by mouth at bedtime.   . furosemide (LASIX) 40 MG tablet Take 20 mg by mouth every evening.  Marland Kitchen HYDROcodone-acetaminophen (NORCO) 10-325 MG per tablet Take 1 tablet by mouth every 6 (six) hours as needed for moderate pain. For pain.  Marland Kitchen ipratropium (ATROVENT) 0.02 % nebulizer solution Take 2.5 mLs (0.5 mg total) by nebulization 4 (four) times daily.   Marland Kitchen NITROSTAT 0.3 MG SL tablet Place 0.3 mg under the tongue every 5 (five) minutes as needed for chest pain.   . TRADJENTA 5 MG TABS tablet Take 1 tablet by mouth daily as needed (when on Prednisone).   . valsartan (DIOVAN) 80 MG tablet Take 80 mg by mouth every morning.    Allergies  Allergen Reactions  . Ace Inhibitors Cough  . Ativan [Lorazepam] Other (See Comments)    Increases agitation, tolerates xanax  . Morphine And Related     Hallucinations, too sedated when given with ativan  . Ibuprofen Rash    Tolerates aspirin per patient.      Past Medical History  Diagnosis Date  . CAD (coronary artery disease)     a. s/p CABG x 5;  b. 04/2012 Cath: patent LIMA->LAD and VG->OM3, all other grafts occluded.  . Hypertension   . Anxiety   . History of kidney cancer   . ADENOCARCINOMA, PROSTATE   . Adjustment disorder with anxiety   . Hyperlipidemia   . BPH (benign prostatic hypertrophy)   . COPD (chronic obstructive pulmonary disease)     a. on home O2.  . Arthritis   . Ischemic cardiomyopathy     a. 06/2013 Echo: EF 30-35%, no reg wma's, Gr2 DD, triv AI, mod dil LA, nl RV fxn.  . Chronic systolic CHF (congestive heart failure)     a. 06/2013 Echo: EF 30-35%.    ROS: Negative except as per HPI  BP 160/78  Pulse 74  Ht 5\' 8"  (1.727 m)  Wt 213 lb (96.616 kg)  BMI 32.39 kg/m2  SpO2 96%  PHYSICAL EXAM: Pt is alert and oriented, NAD HEENT: normal Neck: JVP - normal, carotids 2+=  Lungs: CTA bilaterally CV: RRR without murmur distant heart sounds Abd: soft, NT, Positive BS, no hepatomegaly Ext: no C/C/E, distal pulses nonpalpable, marked nail thickening, onychomycosis Skin: warm/dry no rash  ABI's/Dopplers as per HPI  ASSESSMENT AND PLAN: 68 year-old male with severe lifestyle-limiting claudication of the right leg, new-onset within last 3 months. Not a candidate for pletal with CHF. He is going for cardiac cath next week. I think best to define anatomy with an  angiogram. I have reviewed risks, indication, alternatives to lower extremity angiography with possible PTA and stenting. He understands and agrees to proceed. He would not be a candidate for vascular surgery. Likely will treat conservatively if only finding is long SFA occlusion. Primarily looking for proximal or multilevel disease. Will coordinate with Dr Johnsie Cancel who is doing the patient's heart cath.  Sherren Mocha 08/19/2013 9:29 PM

## 2013-08-16 ENCOUNTER — Ambulatory Visit: Payer: PRIVATE HEALTH INSURANCE

## 2013-08-16 ENCOUNTER — Encounter (HOSPITAL_COMMUNITY): Payer: Medicare Other

## 2013-08-19 ENCOUNTER — Encounter: Payer: Self-pay | Admitting: Cardiovascular Disease

## 2013-08-21 ENCOUNTER — Encounter (HOSPITAL_COMMUNITY)
Admission: RE | Disposition: A | Discharge status: Home / Self Care | Payer: Self-pay | Source: Ambulatory Visit | Attending: Cardiovascular Disease

## 2013-08-21 ENCOUNTER — Encounter (HOSPITAL_COMMUNITY): Payer: Medicare Other

## 2013-08-21 ENCOUNTER — Ambulatory Visit (HOSPITAL_COMMUNITY)
Admission: RE | Admit: 2013-08-21 | Discharge: 2013-08-21 | Disposition: A | Discharge status: Home / Self Care | Payer: Medicare Other | Source: Ambulatory Visit | Attending: Cardiovascular Disease | Admitting: Cardiovascular Disease

## 2013-08-21 DIAGNOSIS — I2510 Atherosclerotic heart disease of native coronary artery without angina pectoris: Secondary | ICD-10-CM

## 2013-08-21 DIAGNOSIS — I70219 Atherosclerosis of native arteries of extremities with intermittent claudication, unspecified extremity: Secondary | ICD-10-CM | POA: Insufficient documentation

## 2013-08-21 DIAGNOSIS — E785 Hyperlipidemia, unspecified: Secondary | ICD-10-CM | POA: Insufficient documentation

## 2013-08-21 DIAGNOSIS — I517 Cardiomegaly: Secondary | ICD-10-CM | POA: Insufficient documentation

## 2013-08-21 DIAGNOSIS — R079 Chest pain, unspecified: Secondary | ICD-10-CM | POA: Insufficient documentation

## 2013-08-21 DIAGNOSIS — Z9981 Dependence on supplemental oxygen: Secondary | ICD-10-CM | POA: Insufficient documentation

## 2013-08-21 DIAGNOSIS — Z951 Presence of aortocoronary bypass graft: Secondary | ICD-10-CM | POA: Insufficient documentation

## 2013-08-21 DIAGNOSIS — I10 Essential (primary) hypertension: Secondary | ICD-10-CM | POA: Insufficient documentation

## 2013-08-21 DIAGNOSIS — I25810 Atherosclerosis of coronary artery bypass graft(s) without angina pectoris: Secondary | ICD-10-CM | POA: Insufficient documentation

## 2013-08-21 DIAGNOSIS — J449 Chronic obstructive pulmonary disease, unspecified: Secondary | ICD-10-CM | POA: Insufficient documentation

## 2013-08-21 DIAGNOSIS — J4489 Other specified chronic obstructive pulmonary disease: Secondary | ICD-10-CM | POA: Insufficient documentation

## 2013-08-21 HISTORY — PX: LEFT AND RIGHT HEART CATHETERIZATION WITH CORONARY ANGIOGRAM: SHX5449

## 2013-08-21 HISTORY — PX: LOWER EXTREMITY ANGIOGRAM: SHX5508

## 2013-08-21 LAB — POCT I-STAT 3, ART BLOOD GAS (G3+)
Acid-base deficit: 2 mmol/L (ref 0.0–2.0)
Bicarbonate: 22.6 mEq/L (ref 20.0–24.0)
O2 SAT: 94 %
PCO2 ART: 37.7 mmHg (ref 35.0–45.0)
PO2 ART: 72 mmHg — AB (ref 80.0–100.0)
TCO2: 24 mmol/L (ref 0–100)
pH, Arterial: 7.386 (ref 7.350–7.450)

## 2013-08-21 LAB — GLUCOSE, CAPILLARY
GLUCOSE-CAPILLARY: 106 mg/dL — AB (ref 70–99)
Glucose-Capillary: 102 mg/dL — ABNORMAL HIGH (ref 70–99)

## 2013-08-21 LAB — POCT I-STAT 3, VENOUS BLOOD GAS (G3P V)
ACID-BASE DEFICIT: 4 mmol/L — AB (ref 0.0–2.0)
Bicarbonate: 22 mEq/L (ref 20.0–24.0)
O2 SAT: 58 %
PCO2 VEN: 42.8 mmHg — AB (ref 45.0–50.0)
PO2 VEN: 33 mmHg (ref 30.0–45.0)
TCO2: 23 mmol/L (ref 0–100)
pH, Ven: 7.32 — ABNORMAL HIGH (ref 7.250–7.300)

## 2013-08-21 SURGERY — LEFT AND RIGHT HEART CATHETERIZATION WITH CORONARY ANGIOGRAM
Anesthesia: LOCAL

## 2013-08-21 MED ORDER — ALPRAZOLAM 1 MG PO TABS: 1.0000 mg | ORAL_TABLET | ORAL | Status: DC | PRN

## 2013-08-21 MED ORDER — ASPIRIN 81 MG PO CHEW
81.0000 mg | CHEWABLE_TABLET | ORAL | Status: AC
  Administered 2013-08-21: 81 mg via ORAL
  Filled 2013-08-21: qty 1

## 2013-08-21 MED ORDER — MIDAZOLAM HCL 2 MG/2ML IJ SOLN
INTRAMUSCULAR | Status: AC
  Filled 2013-08-21: qty 2

## 2013-08-21 MED ORDER — LIDOCAINE HCL (PF) 1 % IJ SOLN
INTRAMUSCULAR | Status: AC
  Filled 2013-08-21: qty 30

## 2013-08-21 MED ORDER — FENTANYL CITRATE 0.05 MG/ML IJ SOLN
INTRAMUSCULAR | Status: AC
  Filled 2013-08-21: qty 2

## 2013-08-21 MED ORDER — SODIUM CHLORIDE 0.9 % IJ SOLN: 3.0000 mL | Freq: Two times a day (BID) | INTRAMUSCULAR | Status: DC

## 2013-08-21 MED ORDER — SODIUM CHLORIDE 0.9 % IV SOLN
INTRAVENOUS | Status: DC
Start: 2013-08-21 — End: 2013-08-21
  Administered 2013-08-21: 09:00:00 via INTRAVENOUS

## 2013-08-21 MED ORDER — HEPARIN (PORCINE) IN NACL 2-0.9 UNIT/ML-% IJ SOLN
INTRAMUSCULAR | Status: AC
  Filled 2013-08-21: qty 1000

## 2013-08-21 MED ORDER — ALBUTEROL SULFATE (2.5 MG/3ML) 0.083% IN NEBU
INHALATION_SOLUTION | RESPIRATORY_TRACT | Status: AC
  Filled 2013-08-21: qty 3

## 2013-08-21 MED ORDER — GUAIFENESIN-CODEINE 100-10 MG/5ML PO SOLN: 5.0000 mL | ORAL | Status: DC | PRN

## 2013-08-21 MED ORDER — SODIUM CHLORIDE 0.9 % IV SOLN: 250.0000 mL | INTRAVENOUS | Status: DC | PRN

## 2013-08-21 MED ORDER — ALBUTEROL SULFATE (2.5 MG/3ML) 0.083% IN NEBU
2.5000 mg | INHALATION_SOLUTION | Freq: Once | RESPIRATORY_TRACT | Status: AC
  Administered 2013-08-21: 2.5 mg via RESPIRATORY_TRACT

## 2013-08-21 MED ORDER — HYDROMORPHONE HCL PF 1 MG/ML IJ SOLN
INTRAMUSCULAR | Status: AC
  Filled 2013-08-21: qty 1

## 2013-08-21 MED ORDER — ALPRAZOLAM 0.25 MG PO TABS
1.0000 mg | ORAL_TABLET | ORAL | Status: DC
  Administered 2013-08-21: 1 mg via ORAL

## 2013-08-21 MED ORDER — HYDROMORPHONE HCL 2 MG PO TABS: 2.0000 mg | ORAL_TABLET | ORAL | Status: DC | PRN

## 2013-08-21 MED ORDER — ALPRAZOLAM 0.25 MG PO TABS
ORAL_TABLET | ORAL | Status: AC
  Filled 2013-08-21: qty 4

## 2013-08-21 MED ORDER — SODIUM CHLORIDE 0.45 % IV SOLN
INTRAVENOUS | Status: AC
  Administered 2013-08-21: 13:00:00 via INTRAVENOUS

## 2013-08-21 MED ORDER — HYDROMORPHONE HCL PF 2 MG/ML IJ SOLN
1.0000 mg | INTRAMUSCULAR | Status: DC | PRN
  Administered 2013-08-21 (×2): 1 mg via INTRAVENOUS

## 2013-08-21 MED ORDER — ONDANSETRON HCL 4 MG/2ML IJ SOLN: 4.0000 mg | Freq: Four times a day (QID) | INTRAMUSCULAR | Status: DC | PRN

## 2013-08-21 MED ORDER — NITROGLYCERIN 0.2 MG/ML ON CALL CATH LAB
INTRAVENOUS | Status: AC
  Filled 2013-08-21: qty 1

## 2013-08-21 MED ORDER — SODIUM CHLORIDE 0.9 % IJ SOLN: 3.0000 mL | INTRAMUSCULAR | Status: DC | PRN

## 2013-08-21 MED ORDER — ACETAMINOPHEN 325 MG PO TABS: 650.0000 mg | ORAL_TABLET | ORAL | Status: DC | PRN

## 2013-08-21 NOTE — CV Procedure (Signed)
Indication:  Chest pain dyspnea prevoius CABG  PVD with possible need for vascular surgery Meds:  6 mg versed and 50ug fentanyl  Right Heart: Mean RA:  6 mmHg RV: 35/1 mmHg PA 26/4 mmHg mean 15 mmHg PCWP:  Mean 10 mmHg  Ao 117/68 mmHg  Ao sat 94% PA sat 58% CO 3.97 L/min   Coronary angiography:  Coronary dominance: right  Left mainstem: Patent with moderate diffuse disease.  Left anterior descending (LAD): Total proximal occlusion, heavy calcification.  Left circumflex (LCx): Severe diffuse disease with severe stenoses of the first and second obtuse marginal branches at the ostia. Subtotal occlusion of the mid left circumflex.  Right coronary artery (RCA): Total occlusion of the proximal vessel  Known frim 20123 SVG - OM3 widely patent, supplies collateral to OM1/2 and RCA branches  SVG - diag total occlusion  Known from 2013 SVG - RCA total occlusion  Known from 2013  SVG OM4 total occlusion  Known from 2013   LIMA - LAD widely patent, supplies collateral to PDA    Final Conclusions:  1. Severe three-vessel coronary artery disease  2. 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  3. Normal intracardiac hemodynamics  Recommendations: Continued medical therapy. His coronary anatomy is stable from his prior cardiac catheterization.  Please see separate note by Dr Burt Knack.  PV runoff done.  Long SFA occlusion.  Not a great surgical candidate and doubt long SFA stent would have longevity  Medical Rx  Of both PVC and CAD  Jenkins Rouge

## 2013-08-21 NOTE — Progress Notes (Signed)
Pt with stable VS. Groin site WNL. No bleeding or hematoma noted.  Pt is going home by taxi and will be alone tonight. Dr. Johnsie Cancel is aware and ordered pts discharge. Pt wheeled out to taxi.

## 2013-08-21 NOTE — H&P (View-Only) (Signed)
Patient ID: Lucas [name], male   DOB: August 29, 1945, 68 y.o.   MRN: [id number] 68 y/o male with h/o CAD s/p prior CABG x 5, hypertension, hyperlipidemia, and supplemental oxygen requiring COPD.04/21/2012 which showed LVEF 45%, patent LIMA-->LAD, patent SVG-->OM3 and chronic occlusion of other grafts  Addmited 12/20 with SSCPR/O Echo with EF 35%   Echo 04/04/13 suggested EF 40-45% New finding  Beta Blocker added to ARB  Seen by pulmonary recently for increased dyspnea  CXR 08/02/13 NAD Placed on prednisone And inhalers  Pro BNP 742  Cr has been up to 1.7 with normal K   Lasix now 20 mg daily RLE pain with ambulation ? Claudication    IMPRESSION: 1. Lung hyperexpansion and bronchitic change without acute cardiopulmonary disease. 2. Cardiomegaly and pulmonary venous congestion without definite evidence of edema  Since d/c has been on steroid taper.  Lasix dose decreased by pulmonary Dyspnea improved   ROS: Denies fever, malais, weight loss, blurry vision, decreased visual acuity, cough, sputum, SOB, hemoptysis, pleuritic pain, palpitaitons, heartburn, abdominal pain, melena, lower extremity edema, claudication, or rash.  All other systems reviewed and negative  General: Affect appropriate Overweight white male   HEENT: normal Neck supple with no adenopathy JVP normal no bruits no thyromegaly Lungs clear with expitory  wheezing and good diaphragmatic motion Heart:  S1/S2 no murmur, no rub, gallop or click PMI normal Abdomen: benighn, BS positve, no tenderness, no AAA no bruit.  No HSM or HJR Distal pulses intact with no bruits No edema Neuro non-focal Skin warm and dry No muscular weakness   Current Outpatient Prescriptions  Medication Sig Dispense Refill  . albuterol (PROVENTIL HFA;VENTOLIN HFA) 108 (90 BASE) MCG/ACT inhaler Inhale 2 puffs into the lungs every 6 (six) hours as needed for wheezing or shortness of breath. Shortness of breath      . albuterol (PROVENTIL) (2.5 MG/3ML)  0.083% nebulizer solution Take 2.5 mg by nebulization 4 (four) times daily.      Marland Kitchen ALPRAZolam (XANAX) 1 MG tablet Take 1 mg by mouth 3 (three) times daily as needed for anxiety.      Marland Kitchen aspirin EC 81 MG EC tablet Take 1 tablet (81 mg total) by mouth daily.      . COMBIVENT RESPIMAT 20-100 MCG/ACT AERS respimat Inhale 1 puff into the lungs every 6 (six) hours as needed.       . doxazosin (CARDURA) 8 MG tablet Take 8 mg by mouth at bedtime.       . furosemide (LASIX) 40 MG tablet Take 20 mg by mouth every evening.      Marland Kitchen HYDROcodone-acetaminophen (NORCO) 10-325 MG per tablet Take 1 tablet by mouth every 6 (six) hours as needed for moderate pain. For pain.      Marland Kitchen ipratropium (ATROVENT) 0.02 % nebulizer solution Take 2.5 mLs (0.5 mg total) by nebulization 4 (four) times daily.  75 mL  12  . NITROSTAT 0.3 MG SL tablet Place 0.3 mg under the tongue every 5 (five) minutes as needed for chest pain.       . predniSONE (DELTASONE) 10 MG tablet 20mg  daily for 4 days, then 10mg  daily for 4 days  12 tablet  0  . TRADJENTA 5 MG TABS tablet Take 1 tablet by mouth as needed.       . valsartan (DIOVAN) 80 MG tablet Take 80 mg by mouth every morning.       No current facility-administered medications for this visit.    Allergies  Ace inhibitors; Ativan; Morphine and related; and Ibuprofen  Electrocardiogram:  SR ? Old anterior MI  Q in lead 3   Assessment and Plan  

## 2013-08-21 NOTE — Discharge Instructions (Addendum)
F/U with Dr Burt Knack regarding PVD and leg pain  F/U with Dr Johnsie Cancel in 3 months Angiography, Care After Refer to this sheet in the next few weeks. These instructions provide you with information on caring for yourself after your procedure. Your health care provider may also give you more specific instructions. Your treatment has been planned according to current medical practices, but problems sometimes occur. Call your health care provider if you have any problems or questions after your procedure.  WHAT TO EXPECT AFTER THE PROCEDURE After your procedure, it is typical to have the following sensations:  Minor discomfort or tenderness and a small bump at the catheter insertion site. The bump should usually decrease in size and tenderness within 1 to 2 weeks.  Any bruising will usually fade within 2 to 4 weeks. HOME CARE INSTRUCTIONS   You may need to keep taking blood thinners if they were prescribed for you. Only take over-the-counter or prescription medicines for pain, fever, or discomfort as directed by your health care provider.  Do not apply powder or lotion to the site.  Do not sit in a bathtub, swimming pool, or whirlpool for 5 to 7 days.  You may shower 24 hours after the procedure. Remove the bandage (dressing) and gently wash the site with plain soap and water. Gently pat the site dry.  Inspect the site at least twice daily.  Limit your activity for the first 48 hours. Do not bend, squat, or lift anything over 20 lb (9 kg) or as directed by your health care provider.  Do not drive home if you are discharged the day of the procedure. Have someone else drive you. Follow instructions about when you can drive or return to work. SEEK MEDICAL CARE IF:  You get lightheaded when standing up.  You have drainage (other than a small amount of blood on the dressing).  You have chills.  You have a fever.  You have redness, warmth, swelling, or pain at the insertion site. SEEK IMMEDIATE  MEDICAL CARE IF:   You develop chest pain or shortness of breath, feel faint, or pass out.  You have bleeding, swelling larger than a walnut, or drainage from the catheter insertion site.  You develop pain, discoloration, coldness, or severe bruising in the leg or arm that held the catheter.  You develop bleeding from any other place, such as the bowels. You may see bright red blood in your urine or stools, or your stools may appear black and tarry.  You have heavy bleeding from the site. If this happens, hold pressure on the site. MAKE SURE YOU:  Understand these instructions.  Will watch your condition.  Will get help right away if you are not doing well or get worse. Document Released: 01/14/2005 Document Revised: 02/28/2013 Document Reviewed: 11/20/2012 [facility] Patient Information 2014 [place]. Marland Kitchen

## 2013-08-21 NOTE — CV Procedure (Signed)
   Cardiac Catheterization Procedure Note  Name: Lucas [name] MRN: [id number] DOB: 06-05-1946  Procedure: Suprarenal abdominal aortic angiogram, distal abdominal aortic angiogram with bilateral lower extremity runoff.  Indication: Severe intermittent claudication of the right leg.   Procedural details: Cardiac catheterization was performed by Dr. Johnsie Cancel using left femoral arterial access. We planned a combined cardiac and peripheral vascular procedure since he had an indication for both. At the completion of the left heart catheterization, the pigtail catheter was brought down to the suprarenal abdominal aortic where a digitally subtracted angiogram was performed via a power injection. After that, the pigtail catheter was pulled down to the distal abdominal aorta and a bilateral lower extremity runoff was performed using a bolus chase technique. The patient tolerated the procedure well. There were no immediate complications. The patient was transferred to the post catheterization recovery area for further monitoring.  Procedural Findings: Abdominal aortogram: The right renal artery is widely patent. The left renal artery is not visualized and it is possible the catheter is positioned below that vessel. There is no obstruction and a single right renal artery. The distal abdominal aorta is patent with mild dilatation present.  Right leg: The common iliac artery is patent. The external iliac artery is patent with mild diffuse stenosis noted. The internal iliac artery is ectatic but patent. The common femoral artery is calcified with mild nonobstructive stenosis. The deep femoral artery is patent. The superficial femoral artery is diffusely diseased throughout its proximal segment until it occludes in the mid vessel. The vessel reconstitutes in Hunter's canal and is diffusely diseased into the above-knee popliteal. The popliteal artery is patent. The anterior tibial has a high origin and it is totally  occluded. The posterior tibial is widely patent. The peroneal artery is patent.  Left leg: The common, external, and internal iliac arteries are patent. The SFA has diffuse moderate calcific stenosis in the midportion. The profundus is patent. The popliteal is patent with mild disease. The peroneal and posterior tibial vessels are patent. The anterior tibial is totally occluded.  Final Conclusions:   1. Minimal aortoiliac disease 2. 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 Moderate diffuse stenosis of the left superficial femoral artery 4. 2 vessel runoff bilaterally  Recommendations: I think medical therapy is probably the best approach here. I will bring the patient back to the office to review revascularization options. I do not think he is a candidate for surgical revascularization because of severe cardiopulmonary disease. Could consider stenting of his totally occluded SFA and will review those options with him.  Sherren Mocha 08/21/2013, 1:52 PM

## 2013-08-21 NOTE — Interval H&P Note (Signed)
History and Physical Interval Note:  08/21/2013 10:41 AM  Lucas [name]  has presented today for surgery, with the diagnosis of cp/shortness of breath  The various methods of treatment have been discussed with the patient and family. After consideration of risks, benefits and other options for treatment, the patient has consented to  Procedure(s): LEFT AND RIGHT HEART CATHETERIZATION WITH CORONARY ANGIOGRAM (N/A) LOWER EXTREMITY ANGIOGRAM (N/A) as a surgical intervention .  The patient's history has been reviewed, patient examined, no change in status, stable for surgery.  I have reviewed the patient's chart and labs.  Questions were answered to the patient's satisfaction.     Jenkins Rouge

## 2013-08-21 NOTE — Care Management (Signed)
Patient extremely anxious during case Post cath groin is fine with no hematoma Some drug seeking behavior despite Having fentanyl , versed and cough medication with codeine Discussed with sister as well  He is stable for d/c Can take taxi home Encouraged to f/u with primary for f/u of chronic anxiety  Lucas [name]

## 2013-08-22 ENCOUNTER — Telehealth: Payer: Self-pay | Admitting: Cardiovascular Disease

## 2013-08-22 ENCOUNTER — Emergency Department (HOSPITAL_COMMUNITY): Payer: Medicare Other

## 2013-08-22 ENCOUNTER — Ambulatory Visit (HOSPITAL_COMMUNITY): Payer: Medicare Other

## 2013-08-22 ENCOUNTER — Emergency Department (HOSPITAL_COMMUNITY)
Admission: EM | Admit: 2013-08-22 | Discharge: 2013-08-22 | Disposition: A | Discharge status: Home / Self Care | Payer: Medicare Other | Attending: Emergency Medicine | Admitting: Emergency Medicine

## 2013-08-22 ENCOUNTER — Encounter (HOSPITAL_COMMUNITY): Payer: Self-pay | Admitting: Emergency Medicine

## 2013-08-22 DIAGNOSIS — R55 Syncope and collapse: Secondary | ICD-10-CM | POA: Insufficient documentation

## 2013-08-22 DIAGNOSIS — F4322 Adjustment disorder with anxiety: Secondary | ICD-10-CM | POA: Insufficient documentation

## 2013-08-22 DIAGNOSIS — Z862 Personal history of diseases of the blood and blood-forming organs and certain disorders involving the immune mechanism: Secondary | ICD-10-CM | POA: Insufficient documentation

## 2013-08-22 DIAGNOSIS — M129 Arthropathy, unspecified: Secondary | ICD-10-CM | POA: Insufficient documentation

## 2013-08-22 DIAGNOSIS — Z79899 Other long term (current) drug therapy: Secondary | ICD-10-CM | POA: Insufficient documentation

## 2013-08-22 DIAGNOSIS — J4489 Other specified chronic obstructive pulmonary disease: Secondary | ICD-10-CM | POA: Insufficient documentation

## 2013-08-22 DIAGNOSIS — Z87891 Personal history of nicotine dependence: Secondary | ICD-10-CM | POA: Insufficient documentation

## 2013-08-22 DIAGNOSIS — J449 Chronic obstructive pulmonary disease, unspecified: Secondary | ICD-10-CM | POA: Insufficient documentation

## 2013-08-22 DIAGNOSIS — Z8546 Personal history of malignant neoplasm of prostate: Secondary | ICD-10-CM | POA: Insufficient documentation

## 2013-08-22 DIAGNOSIS — N400 Enlarged prostate without lower urinary tract symptoms: Secondary | ICD-10-CM | POA: Insufficient documentation

## 2013-08-22 DIAGNOSIS — Z7982 Long term (current) use of aspirin: Secondary | ICD-10-CM | POA: Insufficient documentation

## 2013-08-22 DIAGNOSIS — I10 Essential (primary) hypertension: Secondary | ICD-10-CM | POA: Insufficient documentation

## 2013-08-22 DIAGNOSIS — I2510 Atherosclerotic heart disease of native coronary artery without angina pectoris: Secondary | ICD-10-CM | POA: Insufficient documentation

## 2013-08-22 DIAGNOSIS — Z8639 Personal history of other endocrine, nutritional and metabolic disease: Secondary | ICD-10-CM | POA: Insufficient documentation

## 2013-08-22 DIAGNOSIS — Z951 Presence of aortocoronary bypass graft: Secondary | ICD-10-CM | POA: Insufficient documentation

## 2013-08-22 DIAGNOSIS — I5022 Chronic systolic (congestive) heart failure: Secondary | ICD-10-CM | POA: Insufficient documentation

## 2013-08-22 DIAGNOSIS — Z85528 Personal history of other malignant neoplasm of kidney: Secondary | ICD-10-CM | POA: Insufficient documentation

## 2013-08-22 DIAGNOSIS — Z9889 Other specified postprocedural states: Secondary | ICD-10-CM | POA: Insufficient documentation

## 2013-08-22 HISTORY — DX: Malignant neoplasm of unspecified kidney, except renal pelvis: C64.9

## 2013-08-22 LAB — CBC
HCT: 41.5 % (ref 39.0–52.0)
HEMOGLOBIN: 14.1 g/dL (ref 13.0–17.0)
MCH: 30.5 pg (ref 26.0–34.0)
MCHC: 34 g/dL (ref 30.0–36.0)
MCV: 89.6 fL (ref 78.0–100.0)
Platelets: 153 10*3/uL (ref 150–400)
RBC: 4.63 MIL/uL (ref 4.22–5.81)
RDW: 15.2 % (ref 11.5–15.5)
WBC: 8.1 10*3/uL (ref 4.0–10.5)

## 2013-08-22 LAB — URINALYSIS, ROUTINE W REFLEX MICROSCOPIC
Bilirubin Urine: NEGATIVE
GLUCOSE, UA: NEGATIVE mg/dL
HGB URINE DIPSTICK: NEGATIVE
Ketones, ur: NEGATIVE mg/dL
Leukocytes, UA: NEGATIVE
Nitrite: NEGATIVE
Protein, ur: NEGATIVE mg/dL
SPECIFIC GRAVITY, URINE: 1.022 (ref 1.005–1.030)
Urobilinogen, UA: 0.2 mg/dL (ref 0.0–1.0)
pH: 5 (ref 5.0–8.0)

## 2013-08-22 LAB — BASIC METABOLIC PANEL
BUN: 28 mg/dL — ABNORMAL HIGH (ref 6–23)
CALCIUM: 9.2 mg/dL (ref 8.4–10.5)
CO2: 23 meq/L (ref 19–32)
Chloride: 102 mEq/L (ref 96–112)
Creatinine, Ser: 1.3 mg/dL (ref 0.50–1.35)
GFR calc Af Amer: 64 mL/min — ABNORMAL LOW (ref >90)
GFR calc non Af Amer: 55 mL/min — ABNORMAL LOW (ref >90)
GLUCOSE: 99 mg/dL (ref 70–99)
POTASSIUM: 4.5 meq/L (ref 3.7–5.3)
Sodium: 138 mEq/L (ref 137–147)

## 2013-08-22 LAB — POCT I-STAT TROPONIN I: Troponin i, poc: 0.03 ng/mL (ref 0.00–0.08)

## 2013-08-22 NOTE — ED Notes (Addendum)
Pt c/o neck pain, R shoulder pain, and R hip pain after LOC yesterday/last night.  Pain score 8/10.  Pt sts "I had a cardiac cath yesterday.  They sent me home in a taxi and I live alone.  I don't remember going home and I woke up in the floor this morning.  I don't know what happened."  A & Ox4.  Pt attempted to call MD this morning, but "couldn't get a hold of anyone."

## 2013-08-22 NOTE — ED Notes (Signed)
Patient transported to X-ray 

## 2013-08-22 NOTE — Progress Notes (Signed)
SEE PHONE NOTE./CY 

## 2013-08-22 NOTE — Telephone Encounter (Signed)
New message    Pt said he had a heart cath yesterday and was sent home by taxi.  He says he lives alone and woke up this am alone and he is not happy.  He said he could have bled to death and should have not been sent home to be alone.  He want someone to call him back and tell him why was he sent home to be alone?

## 2013-08-22 NOTE — Telephone Encounter (Signed)
PER PT  CURRENTLY IN  ER  IS  COMPLAINING  THAT HE FELL  VERY  CONFUSED ON THE  DISCHARGE  FROM  HOSP AFTER  CATH   DOES NOT  REMEMBER  GETTING INTO  CAB OR  WALKING INTO  APARTMENT IS  UPSET .  PT  WILL BE  EVALUATED  AND TREATED   INFORMED  PT  IN THE  NEXT COUPLE OF DAYS   NEED TO  MAKE  AN APPOINTMENT WITH  DR COOPER  NEXT AVAILABLE  AND   3 MONTHS WITH  DR Johnsie Cancel. PT  TO CALL BACK /CY

## 2013-08-22 NOTE — Discharge Instructions (Signed)
Syncope  Syncope is a fainting spell. This means the person loses consciousness and drops to the ground. The person is generally unconscious for less than 5 minutes. The person may have some muscle twitches for up to 15 seconds before waking up and returning to normal. Syncope occurs more often in elderly people, but it can happen to anyone. While most causes of syncope are not dangerous, syncope can be a sign of a serious medical problem. It is important to seek medical care.   CAUSES   Syncope is caused by a sudden decrease in blood flow to the brain. The specific cause is often not determined. Factors that can trigger syncope include:   Taking medicines that lower blood pressure.   Sudden changes in posture, such as standing up suddenly.   Taking more medicine than prescribed.   Standing in one place for too long.   Seizure disorders.   Dehydration and excessive exposure to heat.   Low blood sugar (hypoglycemia).   Straining to have a bowel movement.   Heart disease, irregular heartbeat, or other circulatory problems.   Fear, emotional distress, seeing blood, or severe pain.  SYMPTOMS   Right before fainting, you may:   Feel dizzy or lightheaded.   Feel nauseous.   See all white or all black in your field of vision.   Have cold, clammy skin.  DIAGNOSIS   Your caregiver will ask about your symptoms, perform a physical exam, and perform electrocardiography (ECG) to record the electrical activity of your heart. Your caregiver may also perform other heart or blood tests to determine the cause of your syncope.  TREATMENT   In most cases, no treatment is needed. Depending on the cause of your syncope, your caregiver may recommend changing or stopping some of your medicines.  HOME CARE INSTRUCTIONS   Have someone stay with you until you feel stable.   Do not drive, operate machinery, or play sports until your caregiver says it is okay.   Keep all follow-up appointments as directed by your  caregiver.   Lie down right away if you start feeling like you might faint. Breathe deeply and steadily. Wait until all the symptoms have passed.   Drink enough fluids to keep your urine clear or pale yellow.   If you are taking blood pressure or heart medicine, get up slowly, taking several minutes to sit and then stand. This can reduce dizziness.  SEEK IMMEDIATE MEDICAL CARE IF:    You have a severe headache.   You have unusual pain in the chest, abdomen, or back.   You are bleeding from the mouth or rectum, or you have black or tarry stool.   You have an irregular or very fast heartbeat.   You have pain with breathing.   You have repeated fainting or seizure-like jerking during an episode.   You faint when sitting or lying down.   You have confusion.   You have difficulty walking.   You have severe weakness.   You have vision problems.  If you fainted, call your local emergency services (911 in [place] Do not drive yourself to the hospital.   MAKE SURE YOU:   Understand these instructions.   Will watch your condition.   Will get help right away if you are not doing well or get worse.  Document Released: 06/28/2005 Document Revised: 12/28/2011 Document Reviewed: 08/27/2011  [employer] Patient Information 2014 [employer].

## 2013-08-23 ENCOUNTER — Encounter (HOSPITAL_COMMUNITY): Payer: Medicare Other

## 2013-08-23 ENCOUNTER — Ambulatory Visit: Payer: PRIVATE HEALTH INSURANCE

## 2013-08-23 NOTE — ED Provider Notes (Signed)
CSN: [id number]     Arrival date & time 08/22/13  1101 History   First MD Initiated Contact with Patient 08/22/13 1225     Chief Complaint  Patient presents with  . Loss of Consciousness     (Consider location/radiation/quality/duration/timing/severity/associated sxs/prior Treatment) HPI Comments: Patient here because he lost consciousness. He went home in a cab after cardiac cath yesterday. He states he woke up with a little and does not know what happened. I reviewed his cardiac cath records, he received multiple large doses of Versed due to his extreme anxiety. Patient denies any recollection of falls. He's had a little bit of muscular neck pain, otherwise is doing well. Denies any chest pain, shortness of breath  Patient is a 68 y.o. male presenting with syncope. The history is provided by the patient.  Loss of Consciousness Episode history:  Single Most recent episode:  Today Timing: once. Progression:  Resolved Chronicity:  New Context comment:  Had cardiac cath yesterday - received large doses of versed for his anxiety Associated symptoms: no fever and no shortness of breath     Past Medical History  Diagnosis Date  . CAD (coronary artery disease)     a. s/p CABG x 5;  b. 04/2012 Cath: patent LIMA->LAD and VG->OM3, all other grafts occluded.  . Hypertension   . Anxiety   . History of kidney cancer   . Adjustment disorder with anxiety   . Hyperlipidemia   . BPH (benign prostatic hypertrophy)   . COPD (chronic obstructive pulmonary disease)     a. on home O2.  . Arthritis   . Ischemic cardiomyopathy     a. 06/2013 Echo: EF 30-35%, no reg wma's, Gr2 DD, triv AI, mod dil LA, nl RV fxn.  . Chronic systolic CHF (congestive heart failure)     a. 06/2013 Echo: EF 30-35%.  . ADENOCARCINOMA, PROSTATE dx'd 2012  . Renal cancer dx'd 1997    lt nephrectomy   Past Surgical History  Procedure Laterality Date  . Coronary artery bypass graft      x 5  . Cardiac catheterization     . Nephrectomy      left nephrectomy for ca  . Posterior cervical laminectomy      x 8   limited ROM  and can't lie flat  . Heart stents      x 5  . Robot assisted laparoscopic radical prostatectomy      for prostate cancer  . Cystoscopy with litholapaxy  05/01/2012    Procedure: CYSTOSCOPY WITH LITHOLAPAXY;  Surgeon: Dutch Gray, MD;  Location: [facility];  Service: Urology;  Laterality: N/A;   Family History  Problem Relation Age of Onset  . Diabetes Mother   . Coronary artery disease    . Heart disease Mother   . Heart disease Father   . Heart disease Brother   . Diabetes Father    History  Substance Use Topics  . Smoking status: Former Smoker -- 0.30 packs/day for 54 years    Types: Cigarettes    Quit date: 03/29/2013  . Smokeless tobacco: Former Systems developer    Quit date: 04/19/2012     Comment: Using e-cig now  . Alcohol Use: No    Review of Systems  Constitutional: Negative for fever and chills.  Respiratory: Negative for cough and shortness of breath.   Cardiovascular: Positive for syncope.  All other systems reviewed and are negative.      Allergies  Ativan; Lisinopril; Morphine and related;  and Ibuprofen  Home Medications   Current Outpatient Rx  Name  Route  Sig  Dispense  Refill  . albuterol (PROVENTIL HFA;VENTOLIN HFA) 108 (90 BASE) MCG/ACT inhaler   Inhalation   Inhale 2 puffs into the lungs every 6 (six) hours as needed for wheezing or shortness of breath. Shortness of breath         . albuterol (PROVENTIL) (2.5 MG/3ML) 0.083% nebulizer solution   Nebulization   Take 2.5 mg by nebulization 4 (four) times daily.         Marland Kitchen ALPRAZolam (XANAX) 1 MG tablet   Oral   Take 1 mg by mouth 3 (three) times daily as needed for anxiety.         Marland Kitchen aspirin EC 81 MG EC tablet   Oral   Take 1 tablet (81 mg total) by mouth daily.         . COMBIVENT RESPIMAT 20-100 MCG/ACT AERS respimat   Inhalation   Inhale 1 puff into the lungs every 6 (six) hours as needed  for wheezing or shortness of breath.          . doxazosin (CARDURA) 8 MG tablet   Oral   Take 8 mg by mouth at bedtime.          . furosemide (LASIX) 40 MG tablet   Oral   Take 20 mg by mouth every evening.         Marland Kitchen HYDROmorphone (DILAUDID) 2 MG tablet   Oral   Take 1 tablet (2 mg total) by mouth every 2 (two) hours as needed for severe pain.   30 tablet   0   . ipratropium (ATROVENT) 0.02 % nebulizer solution   Nebulization   Take 2.5 mLs (0.5 mg total) by nebulization 4 (four) times daily.   75 mL   12   . NITROSTAT 0.3 MG SL tablet   Sublingual   Place 0.3 mg under the tongue every 5 (five) minutes as needed for chest pain.          . TRADJENTA 5 MG TABS tablet   Oral   Take 1 tablet by mouth daily as needed (when on Prednisone).          . valsartan (DIOVAN) 80 MG tablet   Oral   Take 80 mg by mouth every morning.          BP 141/83  Pulse 82  Temp(Src) 98.5 F (36.9 C) (Oral)  Resp 18  SpO2 98% Physical Exam  Nursing note and vitals reviewed. Constitutional: He is oriented to person, place, and time. He appears well-developed and well-nourished. No distress.  HENT:  Head: Normocephalic and atraumatic.  Mouth/Throat: No oropharyngeal exudate.  Eyes: EOM are normal. Pupils are equal, round, and reactive to light.  Neck: Normal range of motion. Neck supple.  Cardiovascular: Normal rate and regular rhythm.  Exam reveals no friction rub.   No murmur heard. Pulmonary/Chest: Effort normal and breath sounds normal. No respiratory distress. He has no wheezes. He has no rales.  Abdominal: He exhibits no distension. There is no tenderness. There is no rebound.  Musculoskeletal: Normal range of motion. He exhibits no edema.  Neurological: He is alert and oriented to person, place, and time. No cranial nerve deficit. He exhibits normal muscle tone. Coordination normal.  Skin: No rash noted. He is not diaphoretic.    ED Course  Procedures (including  critical care time) Labs Review Labs Reviewed  BASIC METABOLIC PANEL -  Abnormal; Notable for the following:    BUN 28 (*)    GFR calc non Af Amer 55 (*)    GFR calc Af Amer 64 (*)    All other components within normal limits  CBC  URINALYSIS, ROUTINE W REFLEX MICROSCOPIC  POCT I-STAT TROPONIN I   Imaging Review Ct Abdomen Pelvis Wo Contrast  08/22/2013   CLINICAL DATA:  Back pain 1 day post cardiac catherterization question retroperitoneal hemorrhage, history coronary artery disease, hypertension, hyperlipidemia, CHF, prostate cancer, renal cancer  EXAM: CT ABDOMEN AND PELVIS WITHOUT CONTRAST  TECHNIQUE: Multidetector CT imaging of the abdomen and pelvis was performed following the standard protocol without intravenous contrast. Sagittal and coronal MPR images reconstructed from axial data set. Oral contrast not administered for this indication.  COMPARISON:  CT abdomen and pelvis 05/26/2009, CT pelvis 02/12/2010  FINDINGS: Mild bilateral gynecomastia.  Prior median sternotomy with scattered atherosclerotic calcifications identified.  Mild aneurysmal dilatation of distal abdominal aorta, 3.5 x 3.3 cm image 42, previously 3.3 x 3.2 cm.  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  No evidence of retroperitoneal hemorrhage.  Vicarious excretion of contrast into gallbladder.  Surgically absent left kidney with small low-attenuation right renal cysts noted.  Low-attenuation focus right lobe liver 11 mm diameter unchanged.  Liver, spleen, pancreas, kidneys, and adrenal glands otherwise normal appearance.  Normal appendix, subhepatic in position.  Excreted contrast in bladder.  Stomach and bowel loops unremarkable.  Infiltrative changes at left inguinal region presumed access site of cardiac catheterization.  No mass, adenopathy, free fluid or inflammatory process.  No acute osseous findings.  IMPRESSION: Extensive atherosclerotic disease  with aneurysmal dilatation of distal abdominal aorta 3.3 x 3.5 cm in greatest size extending into the common iliac arteries.  No evidence of retroperitoneal or periaortic hemorrhage.  Post left nephrectomy.  No acute intra-abdominal or intrapelvic process identified.   Electronically Signed   By: Lavonia Dana M.D.   On: 08/22/2013 15:39   Dg Chest 2 View  08/22/2013   CLINICAL DATA:  Shortness of breath.  Syncope.  EXAM: CHEST  2 VIEW  COMPARISON:  08/02/2013  FINDINGS: Heart size and pulmonary vascularity are normal. No acute infiltrates or effusions. Chronic accentuation of the interstitial markings with peribronchial thickening. No acute osseous abnormality.  IMPRESSION: Chronic interstitial lung disease. No acute abnormality. Prior CABG.   Electronically Signed   By: Rozetta Nunnery M.D.   On: 08/22/2013 13:58   Dg Shoulder Right  08/22/2013   CLINICAL DATA:  Awoke on floor this morning, uncertain what happened, right shoulder pain  EXAM: RIGHT SHOULDER - 2+ VIEW  COMPARISON:  None  FINDINGS: Mild osseous demineralization.  Mildly widened AC joint question prior distal clavicular resection.  Prior CABG.  No acute fracture, dislocation, or bone destruction.  Visualized right ribs unremarkable.  IMPRESSION: Probable prior distal right clavicular resection.  No acute osseous findings.   Electronically Signed   By: Lavonia Dana M.D.   On: 08/22/2013 12:54   Dg Hip Complete Right  08/22/2013   CLINICAL DATA:  Syncope, right hip pain  EXAM: RIGHT HIP - COMPLETE 2+ VIEW  COMPARISON:  None.  FINDINGS: Three views of the right hip submitted. No acute fracture or subluxation. Mild degenerative changes are noted with narrowing superior joint space.  IMPRESSION: No acute fracture or subluxation. Mild degenerative changes with narrowing of superior joint space.   Electronically Signed  By: Lahoma Crocker M.D.   On: 08/22/2013 12:53    EKG Interpretation    Date/Time:  Wednesday August 22 2013 11:38:51  EST Ventricular Rate:  84 PR Interval:  164 QRS Duration: 98 QT Interval:  411 QTC Calculation: 486 R Axis:   73 Text Interpretation:  Sinus rhythm Multiple premature complexes, vent  Anteroseptal infarct, old Baseline wander in lead(s) V2 Similar to prior Confirmed by Mingo Amber  MD, [place] [phone]) on 08/23/2013 12:52:35 PM            MDM   Final diagnoses:  Syncope    68 year old male presents with syncope and memory problems from yesterday. All after a cardiac cath where he received multiple doses of Versed. His memory issues are likely due to the Versed. Patient with an atraumatic exam. Normal labs and I spoke with Dr. Burt Knack, who performed a cath yesterday, he stated to check a CT abdomen pelvis with a possible retroperitoneal hematoma as an etiology for syncope. That was negative. Labs are stable. Stable for discharge. He is comfortable with this plan.    Osvaldo Shipper, MD 08/23/13 1253

## 2013-08-27 NOTE — Telephone Encounter (Signed)
APPT'S MADE .Adonis Housekeeper

## 2013-08-28 ENCOUNTER — Encounter (HOSPITAL_COMMUNITY): Payer: Medicare Other

## 2013-08-28 ENCOUNTER — Ambulatory Visit: Payer: Medicare Other | Admitting: Cardiovascular Disease

## 2013-08-28 ENCOUNTER — Encounter: Payer: Medicare Other | Admitting: Physician Assistant

## 2013-08-30 ENCOUNTER — Ambulatory Visit: Payer: PRIVATE HEALTH INSURANCE

## 2013-08-30 ENCOUNTER — Encounter (HOSPITAL_COMMUNITY): Payer: Medicare Other

## 2013-08-31 ENCOUNTER — Ambulatory Visit (INDEPENDENT_AMBULATORY_CARE_PROVIDER_SITE_OTHER): Payer: Medicare Other | Admitting: Internal Medicine

## 2013-08-31 ENCOUNTER — Encounter: Payer: Self-pay | Admitting: Internal Medicine

## 2013-08-31 VITALS — BP 140/90 | HR 76 | Ht 68.0 in | Wt 218.0 lb

## 2013-08-31 DIAGNOSIS — Z01811 Encounter for preprocedural respiratory examination: Secondary | ICD-10-CM | POA: Insufficient documentation

## 2013-08-31 DIAGNOSIS — J449 Chronic obstructive pulmonary disease, unspecified: Secondary | ICD-10-CM

## 2013-08-31 DIAGNOSIS — J441 Chronic obstructive pulmonary disease with (acute) exacerbation: Secondary | ICD-10-CM

## 2013-08-31 DIAGNOSIS — Z129 Encounter for screening for malignant neoplasm, site unspecified: Secondary | ICD-10-CM

## 2013-08-31 MED ORDER — PREDNISONE 10 MG PO TABS: ORAL_TABLET | ORAL | Status: DC

## 2013-08-31 MED ORDER — LEVOFLOXACIN 500 MG PO TABS: 500.0000 mg | ORAL_TABLET | Freq: Every day | ORAL | Status: DC

## 2013-08-31 NOTE — Assessment & Plan Note (Signed)
#  preop evaluation for vascular surgery  - I think you might be handle surgery for leg but at some risk; we will have to reassess this formally later

## 2013-08-31 NOTE — Assessment & Plan Note (Signed)
#  lung cancer screening I discussed screening Ct chest for early detection of lung cancer Explained that in age 69-75 and smoking history, annual low dose CT chest can pick up lung cancer early and has potential to save lives and cure lung cancer; medicare pays for age 57-77 This is similar in concept to screening mammogram, colonoscopies and pap smears I explained Ct scan is low dose radiation I explained early lung cancer asymptomatic and only way to  detect is CT  With the real advantage that early lung cancer is curable through radiation or surgery I explained CT superior to CXR I explained that false positives are present and can incur cost and workup like biopsies, additional scan but benefit outweighs risk I explained currently out of pocket $300 or wait for our system to ensure medicare pays for it I recommend one a year  He will wait till 62months to ensure medicare pays for it

## 2013-08-31 NOTE — Assessment & Plan Note (Signed)
#  COPD Continue combivent respimat 4 times daily Start QVAR 51mcg, 2 puff twice daily Use albuterol 2 puff as needed No need for ANORO or BREO because this is not helping you Continue oxygen 18h/day

## 2013-08-31 NOTE — Patient Instructions (Addendum)
#  COPD exacerbation  - you are in flare up of copd again - Take prednisone 40 mg daily x 2 days, then 20mg  daily x 2 days, then 10mg  daily x 2 days, then 5mg  daily x 2 days and stop  take levaquin 500mg  once daily  X 6 days   #COPD Continue combivent respimat 4 times daily Start QVAR 5mcg, 2 puff twice daily Use albuterol 2 puff as needed No need for ANORO or BREO because this is not helping you Continue oxygen 18h/day  #Lung cancer screen  - do LDCT chest at next visit when we have systems in place  #preop evaluation for vascular surgery  - I think you might be handle surgery for leg but at some risk; we will have to reassess this formally later  #FOllowuo Return to see my NP Tammy for med calendar - next several days to few weeks REturn to see me in 3 months  - spirometry at followujp

## 2013-08-31 NOTE — Progress Notes (Signed)
Subjective:    Patient ID: Lucas [name], male    DOB: 1945-08-15, 68 y.o.   MRN: [id number]  HPI    #Smoking  - quit sept 2014 but having withdrawals  #Moderate COPD Spirometry 04/18/12       06/02/12 FEV1 1.64L 46%             1.94  55%   FVC 2.78L 60%               3.01  65% FEV1/FVC 59                   64   #CHF/CAD - s/p CABG. C - Oct 2202 Systolic CHF admission ef 30% - Sept 5427: Diastolic CHF admission  - DEc 2013: EF 30% Admission for Unstable angiona but welll compensated CHF  #Obesity  - Body mass index is 33.15 kg/(m^2). on 08/31/2013 - Have been ruled out January 2015. Advised one liter of oxygen nocturnally    #AECOPD  - 03/26/13 - office Rx - 04/03/13 - AECOPD with Acute Diast CHF admission though 04/06/13 - Jan 2014 with doxy and pred in office with extended pred a week later  #imaging  - 04/03/13 - CHF changes. NEver had CT - 08/31/2013 - prdered CT scan lung cancer scren   OV 08/31/2013    Chief Complaint  Patient presents with  . COPD    follow-up. Pt states he has been having increased productive cough with yellow phlegm, increased SOB, chest congestion,  whezing and chest tightness x 3 days.     Followup COPD   - At last visit mid January 2015 he had a COPD exacerbation with treated with doxycycline and prednisone. He improved after this. In the interim he had a cardiac cath this is listed below. However for the last 3 days is having increased cough, change in color of sputum, increasing wheeze and increased sputum volume. COPD cat score is in the 33s and reflects COPD exacerbation. He takes Combivent respimat which helps him but he does not take ANoro or breo because this does not help him. He is open to taking her steroids  New issue: He is open to having low-dose CT scan of the chest for lung cancer screening because Medicare is paying for it. However, our system not yet set up for this    Past, Family, Social reviewed: Since  last  visit he has had a cardiac catheterization that apparently was clean however, he does have right lower the claudication and apparently he has stenosis. He has a followup with cardiology pending. He is concerned that he might not be a revascularization candidate because of his COPD. In addition Dr. Elsworth Soho for sleep evaluation he tells me that he does not have sleep apnea; confirmed on electronic medical records dated 07/24/2013. His been advised one liters oxygen for sleep     - . CAT COPD Symptom & Quality of Life Score ([employer] trademark) 0 is no burden. 5 is highest burden 03/26/2013  04/23/2013  07/17/2013  08/31/2013 aecopd  Never Cough -> Cough all the time 4 2 3 5   No phlegm in chest -> Chest is full of phlegm 4 2 3 4   No chest tightness -> Chest feels very tight 3 2 3 4   No dyspnea for 1 flight stairs/hill -> Very dyspneic for 1 flight of stairs 4 4 4 4   No limitations for ADL at home -> Very limited with ADL at home 4 2  3 4  Confident leaving home -> Not at all confident leaving home 2 2 2 4   Sleep soundly -> Do not sleep soundly because of lung condition 4 3 3 4   Lots of Energy -> No energy at all 4 4 4 5   TOTAL Score (max 40)  29 21 25  34   Review of Systems  Constitutional: Negative for fever and unexpected weight change.  HENT: Negative for congestion, dental problem, ear pain, nosebleeds, postnasal drip, rhinorrhea, sinus pressure, sneezing, sore throat and trouble swallowing.   Eyes: Negative for redness and itching.  Respiratory: Positive for cough, chest tightness, shortness of breath and wheezing.   Cardiovascular: Negative for palpitations and leg swelling.  Gastrointestinal: Negative for nausea and vomiting.  Genitourinary: Negative for dysuria.  Musculoskeletal: Negative for joint swelling.  Skin: Negative for rash.  Neurological: Negative for headaches.  Hematological: Does not bruise/bleed easily.  Psychiatric/Behavioral: Negative for dysphoric mood. The patient is not  nervous/anxious.        Objective:   Physical Exam  HENT:  Head: Normocephalic and atraumatic.  Right Ear: External ear normal.  Left Ear: External ear normal.  Mouth/Throat: Oropharynx is clear and moist. No oropharyngeal exudate.  mallampatti class 4.  Eyes: Conjunctivae and EOM are normal. Pupils are equal, round, and reactive to light. Right eye exhibits no discharge. Left eye exhibits no discharge. No scleral icterus.  Neck: Normal range of motion. Neck supple. No JVD present. No tracheal deviation present. No thyromegaly present.  Cardiovascular: Normal rate, regular rhythm and intact distal pulses.  Exam reveals no gallop and no friction rub.   No murmur heard. Pulmonary/Chest: Effort normal. No respiratory distress. He has wheezes + bilaterally at base He has no rales. He exhibits no tenderness.  Abdominal: Soft. Bowel sounds are normal. He exhibits no distension and no mass. There is no tenderness. There is no rebound and no guarding.  Musculoskeletal: Normal range of motion. He exhibits no edema and no tenderness.  Lymphadenopathy:    He has no cervical adenopathy.  Neurological: He is alert and oriented to person, place, and time. He has normal reflexes. No cranial nerve deficit. Coordination normal.         Assessment & Plan:

## 2013-08-31 NOTE — Assessment & Plan Note (Signed)
#  COPD exacerbation  - you are in flare up of copd again - Take prednisone 40 mg daily x 2 days, then 20mg  daily x 2 days, then 10mg  daily x 2 days, then 5mg  daily x 2 days and stop  take levaquin 500mg  once daily  X 6 days

## 2013-09-04 ENCOUNTER — Encounter (HOSPITAL_COMMUNITY): Payer: Medicare Other

## 2013-09-06 ENCOUNTER — Encounter (HOSPITAL_COMMUNITY): Payer: Medicare Other

## 2013-09-11 ENCOUNTER — Encounter (HOSPITAL_COMMUNITY): Admission: RE | Admit: 2013-09-11 | Payer: Medicare Other | Source: Ambulatory Visit

## 2013-09-13 ENCOUNTER — Encounter (HOSPITAL_COMMUNITY): Payer: Medicare Other

## 2013-09-14 ENCOUNTER — Encounter: Payer: Self-pay | Admitting: Nurse Practitioner

## 2013-09-14 ENCOUNTER — Ambulatory Visit (INDEPENDENT_AMBULATORY_CARE_PROVIDER_SITE_OTHER): Payer: Medicare Other | Admitting: Nurse Practitioner

## 2013-09-14 VITALS — BP 172/88 | HR 83 | Wt 217.0 lb

## 2013-09-14 DIAGNOSIS — I2589 Other forms of chronic ischemic heart disease: Secondary | ICD-10-CM

## 2013-09-14 DIAGNOSIS — I2510 Atherosclerotic heart disease of native coronary artery without angina pectoris: Secondary | ICD-10-CM

## 2013-09-14 DIAGNOSIS — I255 Ischemic cardiomyopathy: Secondary | ICD-10-CM

## 2013-09-14 DIAGNOSIS — I739 Peripheral vascular disease, unspecified: Secondary | ICD-10-CM

## 2013-09-14 DIAGNOSIS — I10 Essential (primary) hypertension: Secondary | ICD-10-CM

## 2013-09-14 NOTE — Progress Notes (Signed)
Lucas [name] Date of Birth: [birth year] Medical Record [id number]  History of Present Illness: Lucas [name] is seen back today for a post hospital visit. Seen for Dr. Burt Knack but he is a patient of Dr. Kyla Balzarine. He has known CAD with past CABG x 5, HTN, anxiety, renal cell carcinoma, prostate cancer, HLD - apparently not able to take statins, COPD on home oxygen, OA, PAD and chronic systolic HF. EF 30 to 35%. His LIMA to the LAD is patent and the SVG to [facility] but all other SVGs remain occluded.   Referred back earlier this year for cardiac cath and lower extremity arteriogram - to continue with medical management. Sent home but returned to the ER the following day following a syncopal spell - no injury. Had received multiple doses of Versed for anxiety - this was felt to be a contributing factor. Negative CT of the abdomen noted.   Comes back today. Here alone. Says he is doing ok but "ready to get something done". He is able to do all of his ADL's - just takes him a long time and he stops frequently. Says BP is better at home - forgot his Cardura last night and just finished a round of steroids. Tries to limit salt. No chest pain. Has not checked his BP in the past few days but normally better than here today. He has had recent visit with pulmonary and it was noted "I think you might be handle surgery for leg but at some risk; we will have to reassess this formally later".   Current Outpatient Prescriptions  Medication Sig Dispense Refill  . albuterol (PROVENTIL HFA;VENTOLIN HFA) 108 (90 BASE) MCG/ACT inhaler Inhale 2 puffs into the lungs every 6 (six) hours as needed for wheezing or shortness of breath. Shortness of breath      . albuterol (PROVENTIL) (2.5 MG/3ML) 0.083% nebulizer solution Take 2.5 mg by nebulization 4 (four) times daily.      Marland Kitchen ALPRAZolam (XANAX) 1 MG tablet Take 1 mg by mouth 3 (three) times daily as needed for anxiety.      Marland Kitchen aspirin EC 81 MG EC tablet Take 1 tablet (81 mg  total) by mouth daily.      . COMBIVENT RESPIMAT 20-100 MCG/ACT AERS respimat Inhale 1 puff into the lungs every 6 (six) hours as needed for wheezing or shortness of breath.       . doxazosin (CARDURA) 8 MG tablet Take 8 mg by mouth at bedtime.       . furosemide (LASIX) 40 MG tablet Take 20 mg by mouth every evening.      Marland Kitchen HYDROcodone-acetaminophen (NORCO/VICODIN) 5-325 MG per tablet Take 1 tablet by mouth every 6 (six) hours as needed for moderate pain.      Marland Kitchen ipratropium (ATROVENT) 0.02 % nebulizer solution Take 2.5 mLs (0.5 mg total) by nebulization 4 (four) times daily.  75 mL  12  . NITROSTAT 0.3 MG SL tablet Place 0.3 mg under the tongue every 5 (five) minutes as needed for chest pain.       . TRADJENTA 5 MG TABS tablet Take 1 tablet by mouth daily as needed (when on Prednisone).       . valsartan (DIOVAN) 80 MG tablet Take 80 mg by mouth every morning.       No current facility-administered medications for this visit.    Allergies  Allergen Reactions  . Ativan [Lorazepam] Other (See Comments)    Increases agitation, tolerates xanax  .  Lisinopril Cough  . Morphine And Related     Hallucinations, too sedated when given with ativan  . Ibuprofen Hives, Nausea And Vomiting and Rash    Tolerates baby aspirin per patient.      Past Medical History  Diagnosis Date  . CAD (coronary artery disease)     a. s/p CABG x 5;  b. 04/2012 Cath: patent LIMA->LAD and VG->OM3, all other grafts occluded.  . Hypertension   . Anxiety   . History of kidney cancer   . Adjustment disorder with anxiety   . Hyperlipidemia   . BPH (benign prostatic hypertrophy)   . COPD (chronic obstructive pulmonary disease)     a. on home O2.  . Arthritis   . Ischemic cardiomyopathy     a. 06/2013 Echo: EF 30-35%, no reg wma's, Gr2 DD, triv AI, mod dil LA, nl RV fxn.  . Chronic systolic CHF (congestive heart failure)     a. 06/2013 Echo: EF 30-35%.  . ADENOCARCINOMA, PROSTATE dx'd 2012  . Renal cancer dx'd  1997    lt nephrectomy    Past Surgical History  Procedure Laterality Date  . Coronary artery bypass graft      x 5  . Cardiac catheterization    . Nephrectomy      left nephrectomy for ca  . Posterior cervical laminectomy      x 8   limited ROM  and can't lie flat  . Heart stents      x 5  . Robot assisted laparoscopic radical prostatectomy      for prostate cancer  . Cystoscopy with litholapaxy  05/01/2012    Procedure: CYSTOSCOPY WITH LITHOLAPAXY;  Surgeon: Dutch Gray, MD;  Location: [facility];  Service: Urology;  Laterality: N/A;    History  Smoking status  . Former Smoker -- 0.30 packs/day for 54 years  . Types: Cigarettes  . Quit date: 03/29/2013  Smokeless tobacco  . Former Systems developer  . Quit date: 04/19/2012    Comment: Using e-cig now    History  Alcohol Use No    Family History  Problem Relation Age of Onset  . Diabetes Mother   . Coronary artery disease    . Heart disease Mother   . Heart disease Father   . Heart disease Brother   . Diabetes Father     Review of Systems: The review of systems is per the HPI.  All other systems were reviewed and are negative.  Physical Exam: BP 172/88  Pulse 83  Wt 217 lb (98.431 kg) Patient is very pleasant and in no acute distress. Obese. BP is 170/80 in the right arm and 180/80 in the left. Skin is warm and dry. Color is normal.  HEENT is unremarkable. Normocephalic/atraumatic. PERRL. Sclera are nonicteric. Neck is supple. No masses. No JVD. Lungs are fairly clear. Cardiac exam shows a regular rate and rhythm. Abdomen is obese but soft. Extremities are without edema. Gait and ROM are intact. No gross neurologic deficits noted.  LABORATORY DATA: Lab Results  Component Value Date   WBC 8.1 08/22/2013   HGB 14.1 08/22/2013   HCT 41.5 08/22/2013   PLT 153 08/22/2013   GLUCOSE 99 08/22/2013   CHOL 208* 07/01/2013   TRIG 144 07/01/2013   HDL 34* 07/01/2013   LDLCALC 145* 07/01/2013   ALT 54* 04/03/2013   AST 29 04/03/2013    NA 138 08/22/2013   K 4.5 08/22/2013   CL 102 08/22/2013   CREATININE 1.30  08/22/2013   BUN 28* 08/22/2013   CO2 23 08/22/2013   TSH 1.729 07/01/2013   INR 1.2* 08/15/2013   HGBA1C 6.1* 07/01/2013   Coronary angiography:  Coronary dominance: right  Left mainstem: Patent with moderate diffuse disease.  Left anterior descending (LAD): Total proximal occlusion, heavy calcification.  Left circumflex (LCx): Severe diffuse disease with severe stenoses of the first and second obtuse marginal branches at the ostia. Subtotal occlusion of the mid left circumflex.  Right coronary artery (RCA): Total occlusion of the proximal vessel Known frim 20123  SVG - OM3 widely patent, supplies collateral to OM1/2 and RCA branches  SVG - diag total occlusion Known from 2013  SVG - RCA total occlusion Known from 2013  SVG OM4 total occlusion Known from 2013  LIMA - LAD widely patent, supplies collateral to PDA  Final Conclusions:  1. Severe three-vessel coronary artery disease  2. 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  3. Normal intracardiac hemodynamics  Recommendations: Continued medical therapy. His coronary anatomy is stable from his prior cardiac catheterization.  Please see separate note by Dr Burt Knack. PV runoff done. Long SFA occlusion. Not a great surgical candidate and doubt long SFA stent would have longevity Medical Rx  Of both PVC and CAD  Lucas [name]   Abdominal aortogram:  The right renal artery is widely patent. The left renal artery is not visualized and it is possible the catheter is positioned below that vessel. There is no obstruction and a single right renal artery. The distal abdominal aorta is patent with mild dilatation present.  Right leg: The common iliac artery is patent. The external iliac artery is patent with mild diffuse stenosis noted. The internal iliac  artery is ectatic but patent. The common femoral artery is calcified with mild nonobstructive stenosis. The deep femoral artery is patent. The superficial femoral artery is diffusely diseased throughout its proximal segment until it occludes in the mid vessel. The vessel reconstitutes in Hunter's canal and is diffusely diseased into the above-knee popliteal. The popliteal artery is patent. The anterior tibial has a high origin and it is totally occluded. The posterior tibial is widely patent. The peroneal artery is patent.  Left leg: The common, external, and internal iliac arteries are patent. The SFA has diffuse moderate calcific stenosis in the midportion. The profundus is patent. The popliteal is patent with mild disease. The peroneal and posterior tibial vessels are patent. The anterior tibial is totally occluded.  Final Conclusions:  1. Minimal aortoiliac disease  2. 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 Moderate diffuse stenosis of the left superficial femoral artery  4. 2 vessel runoff bilaterally  Recommendations: I think medical therapy is probably the best approach here. I will bring the patient back to the office to review revascularization options. I do not think he is a candidate for surgical revascularization because of severe cardiopulmonary disease. Could consider stenting of his totally occluded SFA and will review those options with him.  Lucas [name]  08/21/2013, 1:52 PM    Echo Study Conclusions from December 2014  - Left ventricle: The cavity size was moderately dilated. There was moderate concentric hypertrophy. Systolic function was moderately to severely reduced. The estimated ejection fraction was in the range of 30% to 35%. Wall motion was normal; there were no regional wall motion abnormalities. Features are consistent  with a pseudonormal left ventricular filling pattern, with concomitant abnormal relaxation and increased filling pressure  (grade 2 diastolic dysfunction). Doppler parameters are consistent with elevated ventricular end-diastolic filling pressure. - Aortic valve: Trileaflet; mildly thickened leaflets. There was no stenosis. Trivial regurgitation. Valve area: 2.62cm^2 (Vmax). - Left atrium: The atrium was moderately dilated. - Right ventricle: The cavity size was mildly dilated. Wall thickness was normal. Systolic function was normal. - Atrial septum: No defect or patent foramen ovale was identified.   Assessment / Plan: 1. CAD - prior CABG with recent cath - continue with medical management  2. PAD - s/p recent arteriogram - he is "ready to get something done" - may need to consider surgical referral  3. HTN - not controlled - missed his Cardura last night - he is agreeable to monitoring at home. See back in a month - for now no change in his medicines.  4. HLD - not able to take statin   5. Ischemic CM - only on ARB therapy. I suspect his lungs limit our use of beta blockers. Has some mild renal insufficiency but might want to consider aldactone.   I will see him back in a month on a day that Dr. Burt Knack is also here. He is to monitor his BP at home.   Patient is agreeable to this plan and will call if any problems develop in the interim.   Burtis Junes, RN, [facility] [address] [place], [place]  [id number] [phone]

## 2013-09-14 NOTE — Patient Instructions (Signed)
Stay on your current medicines for now  Monitor your blood pressure at home and keep a diary  I want to see you in a month on a day that Dr. Burt Knack is also in the office so we can decide about options for your legs  Call the [place] office at [phone] if you have any questions, problems or concerns.

## 2013-09-18 ENCOUNTER — Encounter (HOSPITAL_COMMUNITY): Payer: Medicare Other

## 2013-09-19 ENCOUNTER — Institutional Professional Consult (permissible substitution): Payer: Medicare Other | Admitting: Cardiovascular Disease

## 2013-09-20 ENCOUNTER — Encounter (HOSPITAL_COMMUNITY): Payer: Medicare Other

## 2013-09-25 ENCOUNTER — Encounter (HOSPITAL_COMMUNITY): Payer: Medicare Other

## 2013-09-27 ENCOUNTER — Encounter (HOSPITAL_COMMUNITY): Payer: Medicare Other

## 2013-10-02 ENCOUNTER — Encounter (HOSPITAL_COMMUNITY): Payer: Medicare Other

## 2013-10-04 ENCOUNTER — Encounter (HOSPITAL_COMMUNITY): Payer: Medicare Other

## 2013-10-09 ENCOUNTER — Ambulatory Visit
Admission: RE | Admit: 2013-10-09 | Discharge: 2013-10-09 | Disposition: A | Discharge status: Home / Self Care | Payer: Medicare Other | Source: Ambulatory Visit | Attending: Internal Medicine | Admitting: Internal Medicine

## 2013-10-09 ENCOUNTER — Other Ambulatory Visit: Payer: Self-pay | Admitting: Internal Medicine

## 2013-10-09 ENCOUNTER — Encounter (HOSPITAL_COMMUNITY): Payer: Medicare Other

## 2013-10-09 DIAGNOSIS — R05 Cough: Secondary | ICD-10-CM

## 2013-10-09 DIAGNOSIS — R059 Cough, unspecified: Secondary | ICD-10-CM

## 2013-10-09 DIAGNOSIS — R0602 Shortness of breath: Secondary | ICD-10-CM

## 2013-10-09 DIAGNOSIS — J449 Chronic obstructive pulmonary disease, unspecified: Secondary | ICD-10-CM

## 2013-10-11 ENCOUNTER — Encounter (HOSPITAL_COMMUNITY): Payer: Medicare Other

## 2013-10-16 ENCOUNTER — Emergency Department (HOSPITAL_COMMUNITY): Payer: Medicare Other

## 2013-10-16 ENCOUNTER — Encounter (HOSPITAL_COMMUNITY): Payer: Medicare Other

## 2013-10-16 ENCOUNTER — Emergency Department (HOSPITAL_COMMUNITY)
Admission: EM | Admit: 2013-10-16 | Discharge: 2013-10-16 | Disposition: A | Discharge status: Home / Self Care | Payer: Medicare Other | Attending: Emergency Medicine | Admitting: Emergency Medicine

## 2013-10-16 ENCOUNTER — Other Ambulatory Visit: Payer: Self-pay

## 2013-10-16 ENCOUNTER — Encounter (HOSPITAL_COMMUNITY): Payer: Self-pay | Admitting: Emergency Medicine

## 2013-10-16 DIAGNOSIS — Z8546 Personal history of malignant neoplasm of prostate: Secondary | ICD-10-CM | POA: Diagnosis not present

## 2013-10-16 DIAGNOSIS — N400 Enlarged prostate without lower urinary tract symptoms: Secondary | ICD-10-CM | POA: Diagnosis not present

## 2013-10-16 DIAGNOSIS — R609 Edema, unspecified: Secondary | ICD-10-CM | POA: Insufficient documentation

## 2013-10-16 DIAGNOSIS — F411 Generalized anxiety disorder: Secondary | ICD-10-CM | POA: Diagnosis not present

## 2013-10-16 DIAGNOSIS — Z8639 Personal history of other endocrine, nutritional and metabolic disease: Secondary | ICD-10-CM | POA: Insufficient documentation

## 2013-10-16 DIAGNOSIS — Z9889 Other specified postprocedural states: Secondary | ICD-10-CM | POA: Insufficient documentation

## 2013-10-16 DIAGNOSIS — F4322 Adjustment disorder with anxiety: Secondary | ICD-10-CM | POA: Diagnosis not present

## 2013-10-16 DIAGNOSIS — R079 Chest pain, unspecified: Secondary | ICD-10-CM | POA: Diagnosis present

## 2013-10-16 DIAGNOSIS — M129 Arthropathy, unspecified: Secondary | ICD-10-CM | POA: Insufficient documentation

## 2013-10-16 DIAGNOSIS — I2510 Atherosclerotic heart disease of native coronary artery without angina pectoris: Secondary | ICD-10-CM | POA: Diagnosis not present

## 2013-10-16 DIAGNOSIS — Z862 Personal history of diseases of the blood and blood-forming organs and certain disorders involving the immune mechanism: Secondary | ICD-10-CM | POA: Insufficient documentation

## 2013-10-16 DIAGNOSIS — J441 Chronic obstructive pulmonary disease with (acute) exacerbation: Secondary | ICD-10-CM | POA: Insufficient documentation

## 2013-10-16 DIAGNOSIS — Z7982 Long term (current) use of aspirin: Secondary | ICD-10-CM | POA: Insufficient documentation

## 2013-10-16 DIAGNOSIS — Z79899 Other long term (current) drug therapy: Secondary | ICD-10-CM | POA: Insufficient documentation

## 2013-10-16 DIAGNOSIS — I10 Essential (primary) hypertension: Secondary | ICD-10-CM | POA: Diagnosis not present

## 2013-10-16 DIAGNOSIS — J40 Bronchitis, not specified as acute or chronic: Secondary | ICD-10-CM

## 2013-10-16 DIAGNOSIS — Z951 Presence of aortocoronary bypass graft: Secondary | ICD-10-CM | POA: Diagnosis not present

## 2013-10-16 DIAGNOSIS — Z87891 Personal history of nicotine dependence: Secondary | ICD-10-CM | POA: Insufficient documentation

## 2013-10-16 DIAGNOSIS — Z85528 Personal history of other malignant neoplasm of kidney: Secondary | ICD-10-CM | POA: Insufficient documentation

## 2013-10-16 DIAGNOSIS — I5022 Chronic systolic (congestive) heart failure: Secondary | ICD-10-CM | POA: Insufficient documentation

## 2013-10-16 LAB — CBC
HCT: 42.4 % (ref 39.0–52.0)
HEMOGLOBIN: 14.2 g/dL (ref 13.0–17.0)
MCH: 30.1 pg (ref 26.0–34.0)
MCHC: 33.5 g/dL (ref 30.0–36.0)
MCV: 89.8 fL (ref 78.0–100.0)
Platelets: 161 10*3/uL (ref 150–400)
RBC: 4.72 MIL/uL (ref 4.22–5.81)
RDW: 14.7 % (ref 11.5–15.5)
WBC: 7.1 10*3/uL (ref 4.0–10.5)

## 2013-10-16 LAB — BASIC METABOLIC PANEL
BUN: 27 mg/dL — ABNORMAL HIGH (ref 6–23)
CO2: 23 mEq/L (ref 19–32)
Calcium: 10 mg/dL (ref 8.4–10.5)
Chloride: 99 mEq/L (ref 96–112)
Creatinine, Ser: 1.28 mg/dL (ref 0.50–1.35)
GFR calc Af Amer: 65 mL/min — ABNORMAL LOW (ref >90)
GFR calc non Af Amer: 56 mL/min — ABNORMAL LOW (ref >90)
GLUCOSE: 107 mg/dL — AB (ref 70–99)
POTASSIUM: 4.3 meq/L (ref 3.7–5.3)
SODIUM: 139 meq/L (ref 137–147)

## 2013-10-16 LAB — I-STAT TROPONIN, ED: TROPONIN I, POC: 0.03 ng/mL (ref 0.00–0.08)

## 2013-10-16 LAB — PRO B NATRIURETIC PEPTIDE: Pro B Natriuretic peptide (BNP): 1547 pg/mL — ABNORMAL HIGH (ref 0–125)

## 2013-10-16 LAB — PROTIME-INR
INR: 1.06 (ref 0.00–1.49)
PROTHROMBIN TIME: 13.6 s (ref 11.6–15.2)

## 2013-10-16 LAB — APTT: aPTT: 29 seconds (ref 24–37)

## 2013-10-16 MED ORDER — PREDNISONE (PAK) 10 MG PO TABS: ORAL_TABLET | ORAL | Status: DC

## 2013-10-16 MED ORDER — IPRATROPIUM-ALBUTEROL 0.5-2.5 (3) MG/3ML IN SOLN
3.0000 mL | Freq: Once | RESPIRATORY_TRACT | Status: AC
  Administered 2013-10-16: 3 mL via RESPIRATORY_TRACT
  Filled 2013-10-16: qty 3

## 2013-10-16 MED ORDER — HYDROCODONE-ACETAMINOPHEN 5-325 MG PO TABS: 1.0000 | ORAL_TABLET | ORAL | Status: DC | PRN

## 2013-10-16 MED ORDER — SODIUM CHLORIDE 0.9 % IV SOLN: 1000.0000 mL | INTRAVENOUS | Status: DC

## 2013-10-16 NOTE — ED Notes (Signed)
Pt does not want additional fluids; Pt sts "I don't need extra fluids. I'm full of fluid."

## 2013-10-16 NOTE — Discharge Instructions (Signed)

## 2013-10-16 NOTE — ED Notes (Addendum)
Pt has extensive cardiac hx. Hx of prostate and renal cancer, left kidney removed. Pt reports left sided chest pain x3 days also pain in his back, pain 8/10.  hx of COPD but increased SOB. Reports at home O2 92%, pt used his inhaler. 95% now on room air.  Reports he had a cardiac cath in the last month because right leg has blockage in it.

## 2013-10-16 NOTE — ED Provider Notes (Signed)
CSN: [id number]     Arrival date & time 10/16/13  1049 History  First MD Initiated Contact with Patient 10/16/13 1117     Chief Complaint  Patient presents with  . Chest Pain    HPI Comments: Symptoms all started after coughing a few days ago.  He called his heart doctors office and was told to come to the ED.  Patient is a 68 y.o. male presenting with chest pain. The history is provided by the patient.  Chest Pain Pain location:  Substernal area and L chest Pain quality: pressure and sharp   Pain radiates to:  Upper back Pain severity:  Moderate Duration:  3 days Timing:  Constant Context: breathing   Worsened by:  Deep breathing Associated symptoms: cough and shortness of breath   Associated symptoms: no fever and not vomiting   Risk factors: coronary artery disease   Pt had a heart catheterization in the last month and was told everything was fine with his heart but he was told that he had peripheral vascular disease in his right leg.  Past Medical History  Diagnosis Date  . CAD (coronary artery disease)     a. s/p CABG x 5;  b. 04/2012 Cath: patent LIMA->LAD and VG->OM3, all other grafts occluded.  . Hypertension   . Anxiety   . History of kidney cancer   . Adjustment disorder with anxiety   . Hyperlipidemia   . BPH (benign prostatic hypertrophy)   . COPD (chronic obstructive pulmonary disease)     a. on home O2.  . Arthritis   . Ischemic cardiomyopathy     a. 06/2013 Echo: EF 30-35%, no reg wma's, Gr2 DD, triv AI, mod dil LA, nl RV fxn.  . Chronic systolic CHF (congestive heart failure)     a. 06/2013 Echo: EF 30-35%.  . ADENOCARCINOMA, PROSTATE dx'd 2012  . Renal cancer dx'd 1997    lt nephrectomy   Past Surgical History  Procedure Laterality Date  . Coronary artery bypass graft      x 5  . Cardiac catheterization    . Nephrectomy      left nephrectomy for ca  . Posterior cervical laminectomy      x 8   limited ROM  and can't lie flat  . Heart stents      x  5  . Robot assisted laparoscopic radical prostatectomy      for prostate cancer  . Cystoscopy with litholapaxy  05/01/2012    Procedure: CYSTOSCOPY WITH LITHOLAPAXY;  Surgeon: Dutch Gray, MD;  Location: [facility];  Service: Urology;  Laterality: N/A;  . Prostate cancer     Family History  Problem Relation Age of Onset  . Diabetes Mother   . Coronary artery disease    . Heart disease Mother   . Heart disease Father   . Heart disease Brother   . Diabetes Father    History  Substance Use Topics  . Smoking status: Former Smoker -- 0.30 packs/day for 54 years    Types: Cigarettes    Quit date: 03/29/2013  . Smokeless tobacco: Former Systems developer    Quit date: 04/19/2012     Comment: Using e-cig now  . Alcohol Use: No    Review of Systems  Constitutional: Negative for fever.  Respiratory: Positive for cough and shortness of breath.   Cardiovascular: Positive for chest pain.  Gastrointestinal: Negative for vomiting.  Musculoskeletal:       He feels more swollen  All  other systems reviewed and are negative.      Allergies  Ativan; Lisinopril; Morphine and related; and Ibuprofen  Home Medications   Current Outpatient Rx  Name  Route  Sig  Dispense  Refill  . albuterol (PROVENTIL HFA;VENTOLIN HFA) 108 (90 BASE) MCG/ACT inhaler   Inhalation   Inhale 2 puffs into the lungs every 6 (six) hours as needed for wheezing or shortness of breath. Shortness of breath         . albuterol (PROVENTIL) (2.5 MG/3ML) 0.083% nebulizer solution   Nebulization   Take 2.5 mg by nebulization 4 (four) times daily.         Marland Kitchen ALPRAZolam (XANAX) 1 MG tablet   Oral   Take 1 mg by mouth 3 (three) times daily as needed for anxiety.         Marland Kitchen aspirin EC 81 MG tablet   Oral   Take 81 mg by mouth daily.         . COMBIVENT RESPIMAT 20-100 MCG/ACT AERS respimat   Inhalation   Inhale 1 puff into the lungs every 6 (six) hours as needed for wheezing or shortness of breath.          . doxazosin  (CARDURA) 8 MG tablet   Oral   Take 8 mg by mouth at bedtime.          . furosemide (LASIX) 40 MG tablet   Oral   Take 20 mg by mouth every evening.         Marland Kitchen HYDROcodone-acetaminophen (NORCO/VICODIN) 5-325 MG per tablet   Oral   Take 1 tablet by mouth every 6 (six) hours as needed for moderate pain.         Marland Kitchen ipratropium (ATROVENT) 0.02 % nebulizer solution   Nebulization   Take 0.5 mg by nebulization 4 (four) times daily.         Marland Kitchen NITROSTAT 0.3 MG SL tablet   Sublingual   Place 0.3 mg under the tongue every 5 (five) minutes as needed for chest pain.          . valsartan (DIOVAN) 80 MG tablet   Oral   Take 80 mg by mouth every morning.          BP 147/90  Pulse 98  Resp 23  SpO2 94% Physical Exam  Nursing note and vitals reviewed. Constitutional: No distress.  HENT:  Head: Normocephalic and atraumatic.  Right Ear: External ear normal.  Left Ear: External ear normal.  Eyes: Conjunctivae are normal. Right eye exhibits no discharge. Left eye exhibits no discharge. No scleral icterus.  Neck: Neck supple. No tracheal deviation present.  Cardiovascular: Normal rate, regular rhythm and intact distal pulses.   Pulmonary/Chest: Effort normal. No stridor. No respiratory distress. He has decreased breath sounds (bilateral diminshed sounds). He has no wheezes. He has no rales.  Abdominal: Soft. Bowel sounds are normal. He exhibits no distension. There is no tenderness. There is no rebound and no guarding.  Musculoskeletal: He exhibits edema. He exhibits no tenderness.  Neurological: He is alert. He has normal strength. No cranial nerve deficit (no facial droop, extraocular movements intact, no slurred speech) or sensory deficit. He exhibits normal muscle tone. He displays no seizure activity. Coordination normal.  Skin: Skin is warm and dry. No rash noted. He is not diaphoretic.  Ruddy complexion  Psychiatric: He has a normal mood and affect.    ED Course   Procedures (including critical care time) Labs Review Labs  Reviewed  CBC  BASIC METABOLIC PANEL  PRO B NATRIURETIC PEPTIDE  I-STAT TROPOININ, ED   Imaging Review Dg Chest Port 1 View  10/16/2013   CLINICAL DATA:  Left upper chest pain  EXAM: PORTABLE CHEST - 1 VIEW  COMPARISON:  10/09/2013  FINDINGS: Changes of CABG. Lungs are clear without infiltrate or effusion. Negative for heart failure. No change from the prior study.  IMPRESSION: No active disease.   Electronically Signed   By: Franchot Gallo M.D.   On: 10/16/2013 11:19     EKG Interpretation   Date/Time:  Tuesday October 16 2013 10:53:30 EDT Ventricular Rate:  97 PR Interval:  148 QRS Duration: 94 QT Interval:  356 QTC Calculation: 452 R Axis:   53 Text Interpretation:  Sinus rhythm with Premature supraventricular  complexes and with occasional Premature ventricular complexes Anteroseptal  infarct , age undetermined Abnormal ECG No significant change was found  Confirmed by CAMPOS  MD, KEVIN ([id number]) on 10/16/2013 11:06:40 AM      MDM   Final diagnoses:  Chest pain  Bronchitis    Patient has constant pain in his chest for the last 3 days.  The patient's cardiac enzymes are normal. His EKG is reassuring. I doubt that his symptoms are related to acute coronary syndrome.  Symptoms all started after coughing. He does have history of COPD. In the ED is not wheezing and is in no distress. His symptoms may be  related to his bronchospasm however. Will start him on a steroid taper. Will give him prescription for pain medications. Patient's is encouraged to follow up with his primary Dr.    Kathalene Frames, MD 10/16/13 [phone]

## 2013-10-18 ENCOUNTER — Encounter (HOSPITAL_COMMUNITY): Payer: Medicare Other

## 2013-10-22 ENCOUNTER — Encounter: Payer: Self-pay | Admitting: Nurse Practitioner

## 2013-10-22 ENCOUNTER — Ambulatory Visit (INDEPENDENT_AMBULATORY_CARE_PROVIDER_SITE_OTHER): Payer: Medicaid Other | Admitting: Nurse Practitioner

## 2013-10-22 VITALS — BP 190/100 | HR 76 | Ht 68.5 in | Wt 222.0 lb

## 2013-10-22 DIAGNOSIS — I10 Essential (primary) hypertension: Secondary | ICD-10-CM

## 2013-10-22 DIAGNOSIS — I255 Ischemic cardiomyopathy: Secondary | ICD-10-CM

## 2013-10-22 DIAGNOSIS — I2510 Atherosclerotic heart disease of native coronary artery without angina pectoris: Secondary | ICD-10-CM | POA: Diagnosis not present

## 2013-10-22 DIAGNOSIS — I739 Peripheral vascular disease, unspecified: Secondary | ICD-10-CM

## 2013-10-22 DIAGNOSIS — I2589 Other forms of chronic ischemic heart disease: Secondary | ICD-10-CM

## 2013-10-22 MED ORDER — VALSARTAN 160 MG PO TABS: 160.0000 mg | ORAL_TABLET | Freq: Every day | ORAL | Status: DC

## 2013-10-22 NOTE — Patient Instructions (Addendum)
We will get you a visit with Dr. Fletcher Anon  Increase your Valsartan to 160 mg a day - take 2 of your 80 mg pills each day - I have sent the 160 mg dose to the drug store. Start the increased dose today  I need to see you to recheck your BP next week  Call the [place] office at [phone] if you have any questions, problems or concerns.

## 2013-10-22 NOTE — Progress Notes (Signed)
Lucas [name] Date of Birth: [birth year] Medical Record [id number]  History of Present Illness: Mr. Titone is seen back today for a one month check. Seen for Dr. Burt Knack. Seen for Dr. Burt Knack but he is a patient of Dr. Kyla Balzarine. He has known CAD with past CABG x 5, HTN, anxiety, renal cell carcinoma, prostate cancer, HLD - apparently not able to take statins, COPD on home oxygen, OA, PAD and chronic systolic HF. EF 30 to 35%. His LIMA to the LAD is patent and the SVG to [facility] but all other SVGs remain occluded.   Referred back earlier this year for cardiac cath and lower extremity arteriogram - to continue with medical management. Sent home but returned to the ER the following day following a syncopal spell - no injury. Had received multiple doses of Versed for anxiety - this was felt to be a contributing factor. Negative CT of the abdomen noted.   I saw him a month ago - he was ok but "ready to get something done" to his legs. He had had recent visit with pulmonary and it was noted "I think you might be handle surgery for leg but at some risk; we will have to reassess this formally later". He had missed some medicine prior to his last visit - I had him monitor at home.   Comes back today. Here alone. Was in the ER last week with chest pain/cough - negative enzymes and negative EKG - treated with steroids - on tapering dose. Still with some chest soreness from coughing - feels better with turning over and holding his fist into his chest. Still with some cough - clear sputum. No "heart pain". Wants his leg fixed. Can only walk for about a minute and then has to stop. He has had his medicines this morning. BP Saturday at the drug store was 146/70.   Current Outpatient Prescriptions  Medication Sig Dispense Refill  . albuterol (PROVENTIL HFA;VENTOLIN HFA) 108 (90 BASE) MCG/ACT inhaler Inhale 2 puffs into the lungs every 6 (six) hours as needed for wheezing or shortness of breath. Shortness of breath       . albuterol (PROVENTIL) (2.5 MG/3ML) 0.083% nebulizer solution Take 2.5 mg by nebulization 4 (four) times daily.      Marland Kitchen ALPRAZolam (XANAX) 1 MG tablet Take 1 mg by mouth 3 (three) times daily as needed for anxiety.      Marland Kitchen aspirin EC 81 MG tablet Take 81 mg by mouth daily.      . COMBIVENT RESPIMAT 20-100 MCG/ACT AERS respimat Inhale 1 puff into the lungs every 6 (six) hours as needed for wheezing or shortness of breath.       . doxazosin (CARDURA) 8 MG tablet Take 8 mg by mouth at bedtime.       . furosemide (LASIX) 40 MG tablet Take 20 mg by mouth every evening.      Marland Kitchen HYDROcodone-acetaminophen (NORCO/VICODIN) 5-325 MG per tablet Take 1-2 tablets by mouth every 4 (four) hours as needed.  16 tablet  0  . ipratropium (ATROVENT) 0.02 % nebulizer solution Take 0.5 mg by nebulization 4 (four) times daily.      Marland Kitchen NITROSTAT 0.3 MG SL tablet Place 0.3 mg under the tongue every 5 (five) minutes as needed for chest pain.       . predniSONE (STERAPRED UNI-PAK) 10 MG tablet Take 6 tabs by mouth daily  for 2 days, then 5 tabs for 2 days, then 4 tabs for 2 days,  then 3 tabs for 2 days, 2 tabs for 2 days, then 1 tab by mouth daily for 2 days  42 tablet  0  . valsartan (DIOVAN) 80 MG tablet Take 80 mg by mouth every morning.       No current facility-administered medications for this visit.    Allergies  Allergen Reactions  . Ativan [Lorazepam] Other (See Comments)    Increases agitation, tolerates xanax  . Lisinopril Cough  . Morphine And Related     Hallucinations, too sedated when given with ativan  . Ibuprofen Hives, Nausea And Vomiting and Rash    Tolerates baby aspirin per patient.      Past Medical History  Diagnosis Date  . CAD (coronary artery disease)     a. s/p CABG x 5;  b. 04/2012 Cath: patent LIMA->LAD and VG->OM3, all other grafts occluded.  . Hypertension   . Anxiety   . History of kidney cancer   . Adjustment disorder with anxiety   . Hyperlipidemia   . BPH (benign prostatic  hypertrophy)   . COPD (chronic obstructive pulmonary disease)     a. on home O2.  . Arthritis   . Ischemic cardiomyopathy     a. 06/2013 Echo: EF 30-35%, no reg wma's, Gr2 DD, triv AI, mod dil LA, nl RV fxn.  . Chronic systolic CHF (congestive heart failure)     a. 06/2013 Echo: EF 30-35%.  . ADENOCARCINOMA, PROSTATE dx'd 2012  . Renal cancer dx'd 1997    lt nephrectomy    Past Surgical History  Procedure Laterality Date  . Coronary artery bypass graft      x 5  . Cardiac catheterization    . Nephrectomy      left nephrectomy for ca  . Posterior cervical laminectomy      x 8   limited ROM  and can't lie flat  . Heart stents      x 5  . Robot assisted laparoscopic radical prostatectomy      for prostate cancer  . Cystoscopy with litholapaxy  05/01/2012    Procedure: CYSTOSCOPY WITH LITHOLAPAXY;  Surgeon: Dutch Gray, MD;  Location: [facility];  Service: Urology;  Laterality: N/A;  . Prostate cancer      History  Smoking status  . Former Smoker -- 0.30 packs/day for 54 years  . Types: Cigarettes  . Quit date: 03/29/2013  Smokeless tobacco  . Former Systems developer  . Quit date: 04/19/2012    Comment: Using e-cig now    History  Alcohol Use No    Family History  Problem Relation Age of Onset  . Diabetes Mother   . Coronary artery disease    . Heart disease Mother   . Heart disease Father   . Heart disease Brother   . Diabetes Father     Review of Systems: The review of systems is per the HPI.  All other systems were reviewed and are negative.  Physical Exam: BP 190/100  Pulse 76  Ht 5' 8.5" (1.74 m)  Wt 222 lb (100.699 kg)  BMI 33.26 kg/m2 BP  200/80 by me in both arms.  Patient is very pleasant and in no acute distress. He is obese. Weight up 5 pounds. Smells of tobacco but denies smoking. Skin is warm and dry. Color is normal.  HEENT is unremarkable. Normocephalic/atraumatic. PERRL. Sclera are nonicteric. Neck is supple. No masses. No JVD. Lungs are fairly clear. No  wheezing. Cardiac exam shows a regular rate and rhythm.  Abdomen is soft. Extremities are without edema. Gait and ROM are intact. No gross neurologic deficits noted.  Wt Readings from Last 3 Encounters:  10/22/13 222 lb (100.699 kg)  09/14/13 217 lb (98.431 kg)  08/31/13 218 lb (98.884 kg)     LABORATORY DATA:   Lab Results  Component Value Date   WBC 7.1 10/16/2013   HGB 14.2 10/16/2013   HCT 42.4 10/16/2013   PLT 161 10/16/2013   GLUCOSE 107* 10/16/2013   CHOL 208* 07/01/2013   TRIG 144 07/01/2013   HDL 34* 07/01/2013   LDLCALC 145* 07/01/2013   ALT 54* 04/03/2013   AST 29 04/03/2013   NA 139 10/16/2013   K 4.3 10/16/2013   CL 99 10/16/2013   CREATININE 1.28 10/16/2013   BUN 27* 10/16/2013   CO2 23 10/16/2013   TSH 1.729 07/01/2013   INR 1.06 10/16/2013   HGBA1C 6.1* 07/01/2013   Coronary angiography:  Coronary dominance: right  Left mainstem: Patent with moderate diffuse disease.  Left anterior descending (LAD): Total proximal occlusion, heavy calcification.  Left circumflex (LCx): Severe diffuse disease with severe stenoses of the first and second obtuse marginal branches at the ostia. Subtotal occlusion of the mid left circumflex.  Right coronary artery (RCA): Total occlusion of the proximal vessel Known frim 20123  SVG - OM3 widely patent, supplies collateral to OM1/2 and RCA branches  SVG - diag total occlusion Known from 2013  SVG - RCA total occlusion Known from 2013  SVG OM4 total occlusion Known from 2013  LIMA - LAD widely patent, supplies collateral to PDA  Final Conclusions:  1. Severe three-vessel coronary artery disease  2. 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  3. Normal intracardiac hemodynamics  Recommendations: Continued medical therapy. His coronary anatomy is stable from his prior cardiac catheterization.  Please see separate note by Dr  Burt Knack. PV runoff done. Long SFA occlusion. Not a great surgical candidate and doubt long SFA stent would have longevity Medical Rx  Of both PVC and CAD  Jenkins Rouge   Abdominal aortogram:  The right renal artery is widely patent. The left renal artery is not visualized and it is possible the catheter is positioned below that vessel. There is no obstruction and a single right renal artery. The distal abdominal aorta is patent with mild dilatation present.  Right leg: The common iliac artery is patent. The external iliac artery is patent with mild diffuse stenosis noted. The internal iliac artery is ectatic but patent. The common femoral artery is calcified with mild nonobstructive stenosis. The deep femoral artery is patent. The superficial femoral artery is diffusely diseased throughout its proximal segment until it occludes in the mid vessel. The vessel reconstitutes in Hunter's canal and is diffusely diseased into the above-knee popliteal. The popliteal artery is patent. The anterior tibial has a high origin and it is totally occluded. The posterior tibial is widely patent. The peroneal artery is patent.  Left leg: The common, external, and internal iliac arteries are patent. The SFA has diffuse moderate calcific stenosis in the midportion. The profundus is patent. The popliteal is patent with mild disease. The peroneal and posterior tibial vessels are patent. The anterior tibial is totally occluded.  Final Conclusions:  1. Minimal aortoiliac disease  2. 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 Moderate diffuse stenosis of the  left superficial femoral artery  4. 2 vessel runoff bilaterally  Recommendations: I think medical therapy is probably the best approach here. I will bring the patient back to the office to review revascularization options. I do not think he is a candidate for surgical revascularization because of severe cardiopulmonary disease. Could consider stenting  of his totally occluded SFA and will review those options with him.  Sherren Mocha  08/21/2013, 1:52 PM    Echo Study Conclusions from December 2014  - Left ventricle: The cavity size was moderately dilated. There was moderate concentric hypertrophy. Systolic function was moderately to severely reduced. The estimated ejection fraction was in the range of 30% to 35%. Wall motion was normal; there were no regional wall motion abnormalities. Features are consistent with a pseudonormal left ventricular filling pattern, with concomitant abnormal relaxation and increased filling pressure (grade 2 diastolic dysfunction). Doppler parameters are consistent with elevated ventricular end-diastolic filling pressure. - Aortic valve: Trileaflet; mildly thickened leaflets. There was no stenosis. Trivial regurgitation. Valve area: 2.62cm^2 (Vmax). - Left atrium: The atrium was moderately dilated. - Right ventricle: The cavity size was mildly dilated. Wall thickness was normal. Systolic function was normal. - Atrial septum: No defect or patent foramen ovale was identified.  Assessment / Plan:  1. CAD - prior CABG with recent cardiac cath - continue with medical management.   2. PAD - s/p recent arteriogram - he is "ready to get something done" - patient is subsequently seen with Dr. Burt Knack - It is Dr. Antionette Char opinion that he would not do well with a surgical approach. Dr. Burt Knack would like for him to see Dr. Fletcher Anon for another opinion.   3. HTN - not controlled - ARB is increased today. Will see back in a week.  4. HLD - not able to take statin   5. Ischemic CM - only on ARB therapy. I suspect his lungs limit our use of beta blockers.   See back in a week for BP check. Check BMET on return.   Patient is agreeable to this plan and will call if any problems develop in the interim.    Burtis Junes, RN, [facility] [address] [place], [place]  [id number] [phone]

## 2013-10-23 ENCOUNTER — Encounter (HOSPITAL_COMMUNITY): Payer: Medicare Other

## 2013-10-25 ENCOUNTER — Encounter (HOSPITAL_COMMUNITY): Payer: Medicare Other

## 2013-10-28 ENCOUNTER — Other Ambulatory Visit: Payer: Self-pay

## 2013-10-28 ENCOUNTER — Encounter (HOSPITAL_COMMUNITY): Payer: Self-pay | Admitting: Emergency Medicine

## 2013-10-28 ENCOUNTER — Inpatient Hospital Stay (HOSPITAL_COMMUNITY)
Admission: EM | Admit: 2013-10-28 | Discharge: 2013-11-02 | DRG: 193 | Disposition: A | Discharge status: Home Health | Payer: PRIVATE HEALTH INSURANCE | Attending: Internal Medicine | Admitting: Internal Medicine

## 2013-10-28 ENCOUNTER — Emergency Department (HOSPITAL_COMMUNITY): Payer: PRIVATE HEALTH INSURANCE

## 2013-10-28 DIAGNOSIS — M129 Arthropathy, unspecified: Secondary | ICD-10-CM | POA: Diagnosis present

## 2013-10-28 DIAGNOSIS — Z79899 Other long term (current) drug therapy: Secondary | ICD-10-CM

## 2013-10-28 DIAGNOSIS — E785 Hyperlipidemia, unspecified: Secondary | ICD-10-CM | POA: Diagnosis present

## 2013-10-28 DIAGNOSIS — I5022 Chronic systolic (congestive) heart failure: Secondary | ICD-10-CM | POA: Diagnosis present

## 2013-10-28 DIAGNOSIS — R635 Abnormal weight gain: Secondary | ICD-10-CM | POA: Diagnosis present

## 2013-10-28 DIAGNOSIS — I70209 Unspecified atherosclerosis of native arteries of extremities, unspecified extremity: Secondary | ICD-10-CM | POA: Diagnosis present

## 2013-10-28 DIAGNOSIS — R000 Tachycardia, unspecified: Secondary | ICD-10-CM | POA: Diagnosis present

## 2013-10-28 DIAGNOSIS — Z01811 Encounter for preprocedural respiratory examination: Secondary | ICD-10-CM

## 2013-10-28 DIAGNOSIS — N179 Acute kidney failure, unspecified: Secondary | ICD-10-CM | POA: Diagnosis present

## 2013-10-28 DIAGNOSIS — A419 Sepsis, unspecified organism: Secondary | ICD-10-CM | POA: Diagnosis present

## 2013-10-28 DIAGNOSIS — F4322 Adjustment disorder with anxiety: Secondary | ICD-10-CM | POA: Diagnosis present

## 2013-10-28 DIAGNOSIS — Z87891 Personal history of nicotine dependence: Secondary | ICD-10-CM

## 2013-10-28 DIAGNOSIS — Z8249 Family history of ischemic heart disease and other diseases of the circulatory system: Secondary | ICD-10-CM

## 2013-10-28 DIAGNOSIS — I10 Essential (primary) hypertension: Secondary | ICD-10-CM

## 2013-10-28 DIAGNOSIS — I2589 Other forms of chronic ischemic heart disease: Secondary | ICD-10-CM | POA: Diagnosis present

## 2013-10-28 DIAGNOSIS — E669 Obesity, unspecified: Secondary | ICD-10-CM

## 2013-10-28 DIAGNOSIS — I129 Hypertensive chronic kidney disease with stage 1 through stage 4 chronic kidney disease, or unspecified chronic kidney disease: Secondary | ICD-10-CM | POA: Diagnosis present

## 2013-10-28 DIAGNOSIS — Z951 Presence of aortocoronary bypass graft: Secondary | ICD-10-CM

## 2013-10-28 DIAGNOSIS — J9620 Acute and chronic respiratory failure, unspecified whether with hypoxia or hypercapnia: Secondary | ICD-10-CM | POA: Diagnosis present

## 2013-10-28 DIAGNOSIS — Z85528 Personal history of other malignant neoplasm of kidney: Secondary | ICD-10-CM

## 2013-10-28 DIAGNOSIS — J449 Chronic obstructive pulmonary disease, unspecified: Secondary | ICD-10-CM | POA: Diagnosis present

## 2013-10-28 DIAGNOSIS — Z9861 Coronary angioplasty status: Secondary | ICD-10-CM

## 2013-10-28 DIAGNOSIS — Z87898 Personal history of other specified conditions: Secondary | ICD-10-CM

## 2013-10-28 DIAGNOSIS — C61 Malignant neoplasm of prostate: Secondary | ICD-10-CM

## 2013-10-28 DIAGNOSIS — G4733 Obstructive sleep apnea (adult) (pediatric): Secondary | ICD-10-CM

## 2013-10-28 DIAGNOSIS — Z9114 Patient's other noncompliance with medication regimen: Secondary | ICD-10-CM

## 2013-10-28 DIAGNOSIS — N400 Enlarged prostate without lower urinary tract symptoms: Secondary | ICD-10-CM | POA: Diagnosis present

## 2013-10-28 DIAGNOSIS — R7303 Prediabetes: Secondary | ICD-10-CM

## 2013-10-28 DIAGNOSIS — N181 Chronic kidney disease, stage 1: Secondary | ICD-10-CM

## 2013-10-28 DIAGNOSIS — I509 Heart failure, unspecified: Secondary | ICD-10-CM

## 2013-10-28 DIAGNOSIS — I4949 Other premature depolarization: Secondary | ICD-10-CM | POA: Diagnosis not present

## 2013-10-28 DIAGNOSIS — I5043 Acute on chronic combined systolic (congestive) and diastolic (congestive) heart failure: Secondary | ICD-10-CM

## 2013-10-28 DIAGNOSIS — I209 Angina pectoris, unspecified: Secondary | ICD-10-CM

## 2013-10-28 DIAGNOSIS — Z7982 Long term (current) use of aspirin: Secondary | ICD-10-CM

## 2013-10-28 DIAGNOSIS — J441 Chronic obstructive pulmonary disease with (acute) exacerbation: Secondary | ICD-10-CM | POA: Diagnosis present

## 2013-10-28 DIAGNOSIS — F17200 Nicotine dependence, unspecified, uncomplicated: Secondary | ICD-10-CM

## 2013-10-28 DIAGNOSIS — N189 Chronic kidney disease, unspecified: Secondary | ICD-10-CM | POA: Diagnosis present

## 2013-10-28 DIAGNOSIS — Z8546 Personal history of malignant neoplasm of prostate: Secondary | ICD-10-CM

## 2013-10-28 DIAGNOSIS — Z885 Allergy status to narcotic agent status: Secondary | ICD-10-CM

## 2013-10-28 DIAGNOSIS — Z888 Allergy status to other drugs, medicaments and biological substances status: Secondary | ICD-10-CM

## 2013-10-28 DIAGNOSIS — N390 Urinary tract infection, site not specified: Secondary | ICD-10-CM

## 2013-10-28 DIAGNOSIS — I255 Ischemic cardiomyopathy: Secondary | ICD-10-CM | POA: Diagnosis present

## 2013-10-28 DIAGNOSIS — I2510 Atherosclerotic heart disease of native coronary artery without angina pectoris: Secondary | ICD-10-CM | POA: Diagnosis present

## 2013-10-28 DIAGNOSIS — Z9981 Dependence on supplemental oxygen: Secondary | ICD-10-CM

## 2013-10-28 DIAGNOSIS — Z129 Encounter for screening for malignant neoplasm, site unspecified: Secondary | ICD-10-CM

## 2013-10-28 DIAGNOSIS — R300 Dysuria: Secondary | ICD-10-CM

## 2013-10-28 DIAGNOSIS — J189 Pneumonia, unspecified organism: Principal | ICD-10-CM | POA: Diagnosis present

## 2013-10-28 LAB — CBC WITH DIFFERENTIAL/PLATELET
BASOS ABS: 0 10*3/uL (ref 0.0–0.1)
BASOS PCT: 0 % (ref 0–1)
EOS ABS: 0 10*3/uL (ref 0.0–0.7)
Eosinophils Relative: 0 % (ref 0–5)
HCT: 42.8 % (ref 39.0–52.0)
Hemoglobin: 14.6 g/dL (ref 13.0–17.0)
Lymphocytes Relative: 6 % — ABNORMAL LOW (ref 12–46)
Lymphs Abs: 0.9 10*3/uL (ref 0.7–4.0)
MCH: 31 pg (ref 26.0–34.0)
MCHC: 34.1 g/dL (ref 30.0–36.0)
MCV: 90.9 fL (ref 78.0–100.0)
Monocytes Absolute: 1.3 10*3/uL — ABNORMAL HIGH (ref 0.1–1.0)
Monocytes Relative: 8 % (ref 3–12)
NEUTROS ABS: 13.9 10*3/uL — AB (ref 1.7–7.7)
NEUTROS PCT: 86 % — AB (ref 43–77)
Platelets: 146 10*3/uL — ABNORMAL LOW (ref 150–400)
RBC: 4.71 MIL/uL (ref 4.22–5.81)
RDW: 14.8 % (ref 11.5–15.5)
WBC: 16.1 10*3/uL — ABNORMAL HIGH (ref 4.0–10.5)

## 2013-10-28 LAB — BLOOD GAS, ARTERIAL
Acid-base deficit: 1.4 mmol/L (ref 0.0–2.0)
Bicarbonate: 22.4 mEq/L (ref 20.0–24.0)
Drawn by: 308601
O2 Content: 5 L/min
O2 Saturation: 95.2 %
PH ART: 7.403 (ref 7.350–7.450)
Patient temperature: 98.6
TCO2: 19.5 mmol/L (ref 0–100)
pCO2 arterial: 36.7 mmHg (ref 35.0–45.0)
pO2, Arterial: 77.8 mmHg — ABNORMAL LOW (ref 80.0–100.0)

## 2013-10-28 LAB — BASIC METABOLIC PANEL
BUN: 40 mg/dL — ABNORMAL HIGH (ref 6–23)
CO2: 24 mEq/L (ref 19–32)
Calcium: 9.8 mg/dL (ref 8.4–10.5)
Chloride: 94 mEq/L — ABNORMAL LOW (ref 96–112)
Creatinine, Ser: 1.55 mg/dL — ABNORMAL HIGH (ref 0.50–1.35)
GFR calc Af Amer: 51 mL/min — ABNORMAL LOW (ref >90)
GFR, EST NON AFRICAN AMERICAN: 44 mL/min — AB (ref >90)
Glucose, Bld: 107 mg/dL — ABNORMAL HIGH (ref 70–99)
POTASSIUM: 3.8 meq/L (ref 3.7–5.3)
Sodium: 136 mEq/L — ABNORMAL LOW (ref 137–147)

## 2013-10-28 LAB — PRO B NATRIURETIC PEPTIDE: PRO B NATRI PEPTIDE: 1388 pg/mL — AB (ref 0–125)

## 2013-10-28 LAB — I-STAT CG4 LACTIC ACID, ED: LACTIC ACID, VENOUS: 1.88 mmol/L (ref 0.5–2.2)

## 2013-10-28 LAB — I-STAT TROPONIN, ED: TROPONIN I, POC: 0.04 ng/mL (ref 0.00–0.08)

## 2013-10-28 MED ORDER — FUROSEMIDE 20 MG PO TABS
20.0000 mg | ORAL_TABLET | Freq: Every evening | ORAL | Status: DC
  Administered 2013-10-29: 20 mg via ORAL
  Filled 2013-10-28 (×2): qty 1

## 2013-10-28 MED ORDER — IPRATROPIUM BROMIDE 0.02 % IN SOLN: 0.5000 mg | Freq: Four times a day (QID) | RESPIRATORY_TRACT | Status: DC

## 2013-10-28 MED ORDER — ALBUTEROL SULFATE HFA 108 (90 BASE) MCG/ACT IN AERS: 2.0000 | INHALATION_SPRAY | Freq: Four times a day (QID) | RESPIRATORY_TRACT | Status: DC | PRN

## 2013-10-28 MED ORDER — METHYLPREDNISOLONE SODIUM SUCC 125 MG IJ SOLR: 125.0000 mg | Freq: Once | INTRAMUSCULAR | Status: DC

## 2013-10-28 MED ORDER — VANCOMYCIN HCL IN DEXTROSE 1-5 GM/200ML-% IV SOLN
1000.0000 mg | Freq: Once | INTRAVENOUS | Status: AC
  Administered 2013-10-28: 1000 mg via INTRAVENOUS
  Filled 2013-10-28: qty 200

## 2013-10-28 MED ORDER — ALPRAZOLAM 1 MG PO TABS
1.0000 mg | ORAL_TABLET | Freq: Three times a day (TID) | ORAL | Status: DC | PRN
  Administered 2013-10-28 – 2013-11-02 (×13): 1 mg via ORAL
  Filled 2013-10-28 (×13): qty 1

## 2013-10-28 MED ORDER — ASPIRIN EC 81 MG PO TBEC
81.0000 mg | DELAYED_RELEASE_TABLET | Freq: Every day | ORAL | Status: DC
  Administered 2013-10-29 – 2013-11-02 (×5): 81 mg via ORAL
  Filled 2013-10-28 (×5): qty 1

## 2013-10-28 MED ORDER — IPRATROPIUM-ALBUTEROL 0.5-2.5 (3) MG/3ML IN SOLN
3.0000 mL | Freq: Four times a day (QID) | RESPIRATORY_TRACT | Status: DC
  Administered 2013-10-28 – 2013-10-30 (×9): 3 mL via RESPIRATORY_TRACT
  Filled 2013-10-28 (×9): qty 3

## 2013-10-28 MED ORDER — ALBUTEROL (5 MG/ML) CONTINUOUS INHALATION SOLN
10.0000 mg/h | INHALATION_SOLUTION | Freq: Once | RESPIRATORY_TRACT | Status: AC
  Administered 2013-10-28: 10 mg/h via RESPIRATORY_TRACT
  Filled 2013-10-28: qty 20

## 2013-10-28 MED ORDER — IRBESARTAN 150 MG PO TABS
150.0000 mg | ORAL_TABLET | Freq: Every day | ORAL | Status: DC
  Filled 2013-10-28: qty 1

## 2013-10-28 MED ORDER — HYDROCODONE-ACETAMINOPHEN 5-325 MG PO TABS
1.0000 | ORAL_TABLET | ORAL | Status: DC | PRN
  Administered 2013-10-29: 2 via ORAL
  Administered 2013-10-29 (×2): 1 via ORAL
  Administered 2013-10-29: 2 via ORAL
  Administered 2013-10-29: 1 via ORAL
  Administered 2013-10-30 – 2013-11-02 (×14): 2 via ORAL
  Filled 2013-10-28 (×6): qty 2
  Filled 2013-10-28: qty 1
  Filled 2013-10-28 (×6): qty 2
  Filled 2013-10-28: qty 1
  Filled 2013-10-28 (×5): qty 2

## 2013-10-28 MED ORDER — IPRATROPIUM-ALBUTEROL 20-100 MCG/ACT IN AERS: 1.0000 | INHALATION_SPRAY | Freq: Four times a day (QID) | RESPIRATORY_TRACT | Status: DC | PRN

## 2013-10-28 MED ORDER — ALBUTEROL (5 MG/ML) CONTINUOUS INHALATION SOLN
2.5000 mg/h | INHALATION_SOLUTION | Freq: Once | RESPIRATORY_TRACT | Status: DC
Start: 2013-10-28 — End: 2013-10-28

## 2013-10-28 MED ORDER — HEPARIN SODIUM (PORCINE) 5000 UNIT/ML IJ SOLN
5000.0000 [IU] | Freq: Three times a day (TID) | INTRAMUSCULAR | Status: DC
  Administered 2013-10-28 – 2013-11-02 (×14): 5000 [IU] via SUBCUTANEOUS
  Filled 2013-10-28 (×17): qty 1

## 2013-10-28 MED ORDER — DEXTROSE 5 % IV SOLN
1.0000 g | Freq: Once | INTRAVENOUS | Status: AC
  Administered 2013-10-29: 1 g via INTRAVENOUS
  Filled 2013-10-28 (×2): qty 1

## 2013-10-28 MED ORDER — ONDANSETRON HCL 4 MG/2ML IJ SOLN
4.0000 mg | Freq: Once | INTRAMUSCULAR | Status: AC
  Administered 2013-10-28: 4 mg via INTRAVENOUS
  Filled 2013-10-28: qty 2

## 2013-10-28 MED ORDER — IPRATROPIUM-ALBUTEROL 0.5-2.5 (3) MG/3ML IN SOLN
3.0000 mL | Freq: Four times a day (QID) | RESPIRATORY_TRACT | Status: DC | PRN
  Administered 2013-10-29 – 2013-10-31 (×4): 3 mL via RESPIRATORY_TRACT
  Filled 2013-10-28 (×5): qty 3

## 2013-10-28 MED ORDER — SODIUM CHLORIDE 0.9 % IV SOLN
INTRAVENOUS | Status: AC
  Administered 2013-10-28: 23:00:00 via INTRAVENOUS

## 2013-10-28 MED ORDER — SODIUM CHLORIDE 0.9 % IV SOLN
INTRAVENOUS | Status: DC
Start: 2013-10-28 — End: 2013-10-28

## 2013-10-28 MED ORDER — ALBUTEROL SULFATE (2.5 MG/3ML) 0.083% IN NEBU: 2.5000 mg | INHALATION_SOLUTION | Freq: Four times a day (QID) | RESPIRATORY_TRACT | Status: DC

## 2013-10-28 MED ORDER — SODIUM CHLORIDE 0.9 % IV SOLN
INTRAVENOUS | Status: DC
  Administered 2013-10-28: 19:00:00 via INTRAVENOUS

## 2013-10-28 MED ORDER — METHYLPREDNISOLONE SODIUM SUCC 125 MG IJ SOLR
60.0000 mg | Freq: Two times a day (BID) | INTRAMUSCULAR | Status: DC
  Administered 2013-10-29 – 2013-10-31 (×5): 60 mg via INTRAVENOUS
  Filled 2013-10-28 (×7): qty 0.96

## 2013-10-28 MED ORDER — DOXAZOSIN MESYLATE 8 MG PO TABS
8.0000 mg | ORAL_TABLET | Freq: Every day | ORAL | Status: DC
  Administered 2013-10-28 – 2013-11-01 (×5): 8 mg via ORAL
  Filled 2013-10-28 (×6): qty 1

## 2013-10-28 NOTE — ED Notes (Signed)
RT at bedside to initiate updraft

## 2013-10-28 NOTE — ED Notes (Signed)
Pt completed continuous nebulizer at this time.  EDP aware.  Pt reports feeling "a little better", remains SOB. NAD.

## 2013-10-28 NOTE — ED Provider Notes (Signed)
CSN: [id number]     Arrival date & time 10/28/13  1855 History   First MD Initiated Contact with Patient 10/28/13 1857     Chief Complaint  Patient presents with  . Shortness of Breath     (Consider location/radiation/quality/duration/timing/severity/associated sxs/prior Treatment) Patient is a 68 y.o. male presenting with shortness of breath. The history is provided by the patient and the EMS personnel.  Shortness of Breath Associated symptoms: abdominal pain, chest pain, cough, fever and wheezing   Associated symptoms: no headaches, no rash and no vomiting    patient brought in by EMS for shortness of breath and cough has gotten worse in the last 3 days. Cough become productive. EMS originally wanted put him on CPAP but he refused. Patient given side a Medrol 125 mg in the duo neb in route. Patient is normally on 2 L nasal cannula oxygen his had to increase that up to 4 in the past few days. Patient was hospitalized the end of December going into January. Patient has a known history of COPD and CHF. He's had some chest pain with this episode but it's only when he coughs. Patient's had a yellow productive sputum. Patient's felt like his had a low-grade fever here today. Patient arrived in the respiratory distress despite the treatments still needed to tripod. Still using accessory muscles. Patient primary care Dr. is Willey Blade.  Patient finished a course of prednisone approximately 2 days ago.  Past Medical History  Diagnosis Date  . CAD (coronary artery disease)     a. s/p CABG x 5;  b. 04/2012 Cath: patent LIMA->LAD and VG->OM3, all other grafts occluded.  . Hypertension   . Anxiety   . History of kidney cancer   . Adjustment disorder with anxiety   . Hyperlipidemia   . BPH (benign prostatic hypertrophy)   . COPD (chronic obstructive pulmonary disease)     a. on home O2.  . Arthritis   . Ischemic cardiomyopathy     a. 06/2013 Echo: EF 30-35%, no reg wma's, Gr2 DD, triv AI,  mod dil LA, nl RV fxn.  . Chronic systolic CHF (congestive heart failure)     a. 06/2013 Echo: EF 30-35%.  . ADENOCARCINOMA, PROSTATE dx'd 2012  . Renal cancer dx'd 1997    lt nephrectomy   Past Surgical History  Procedure Laterality Date  . Coronary artery bypass graft      x 5  . Cardiac catheterization    . Nephrectomy      left nephrectomy for ca  . Posterior cervical laminectomy      x 8   limited ROM  and can't lie flat  . Heart stents      x 5  . Robot assisted laparoscopic radical prostatectomy      for prostate cancer  . Cystoscopy with litholapaxy  05/01/2012    Procedure: CYSTOSCOPY WITH LITHOLAPAXY;  Surgeon: Dutch Gray, MD;  Location: [facility];  Service: Urology;  Laterality: N/A;  . Prostate cancer     Family History  Problem Relation Age of Onset  . Diabetes Mother   . Coronary artery disease    . Heart disease Mother   . Heart disease Father   . Heart disease Brother   . Diabetes Father    History  Substance Use Topics  . Smoking status: Former Smoker -- 0.30 packs/day for 54 years    Types: Cigarettes    Quit date: 03/29/2013  . Smokeless tobacco: Former Systems developer  Quit date: 04/19/2012     Comment: Using e-cig now  . Alcohol Use: No    Review of Systems  Constitutional: Positive for fever.  HENT: Negative for congestion.   Eyes: Negative for redness.  Respiratory: Positive for cough, shortness of breath and wheezing.   Cardiovascular: Positive for chest pain.  Gastrointestinal: Positive for abdominal pain. Negative for nausea and vomiting.  Genitourinary: Negative for dysuria.  Musculoskeletal: Positive for back pain.  Skin: Negative for rash.  Neurological: Negative for headaches.  Hematological: Does not bruise/bleed easily.  Psychiatric/Behavioral: Negative for confusion.      Allergies  Ativan; Lisinopril; Morphine and related; and Ibuprofen  Home Medications   Prior to Admission medications   Medication Sig Start Date End Date  Taking? Authorizing Provider  albuterol (PROVENTIL HFA;VENTOLIN HFA) 108 (90 BASE) MCG/ACT inhaler Inhale 2 puffs into the lungs every 6 (six) hours as needed for wheezing or shortness of breath. Shortness of breath   Yes Historical Provider, MD  albuterol (PROVENTIL) (2.5 MG/3ML) 0.083% nebulizer solution Take 2.5 mg by nebulization 4 (four) times daily. 04/06/13  Yes Erick Colace, NP  ALPRAZolam Duanne Moron) 1 MG tablet Take 1 mg by mouth 3 (three) times daily as needed for anxiety.   Yes Historical Provider, MD  aspirin EC 81 MG tablet Take 81 mg by mouth daily.   Yes Historical Provider, MD  COMBIVENT RESPIMAT 20-100 MCG/ACT AERS respimat Inhale 1 puff into the lungs every 6 (six) hours as needed for wheezing or shortness of breath.  06/04/13  Yes Historical Provider, MD  doxazosin (CARDURA) 8 MG tablet Take 8 mg by mouth at bedtime.    Yes Historical Provider, MD  furosemide (LASIX) 40 MG tablet Take 20 mg by mouth every evening.   Yes Historical Provider, MD  HYDROcodone-acetaminophen (NORCO/VICODIN) 5-325 MG per tablet Take 1-2 tablets by mouth every 4 (four) hours as needed (pain). 10/16/13  Yes Kathalene Frames, MD  ipratropium (ATROVENT) 0.02 % nebulizer solution Take 0.5 mg by nebulization 4 (four) times daily.   Yes Historical Provider, MD  NITROSTAT 0.3 MG SL tablet Place 0.3 mg under the tongue every 5 (five) minutes as needed for chest pain.  06/15/13  Yes Historical Provider, MD  predniSONE (STERAPRED UNI-PAK) 10 MG tablet Take 6 tabs by mouth daily  for 2 days, then 5 tabs for 2 days, then 4 tabs for 2 days, then 3 tabs for 2 days, 2 tabs for 2 days, then 1 tab by mouth daily for 2 days 10/16/13  Yes Kathalene Frames, MD  valsartan (DIOVAN) 160 MG tablet Take 160 mg by mouth daily. 10/22/13  Yes Burtis Junes, NP   BP 129/60  Pulse 115  Temp(Src) 100.2 F (37.9 C) (Oral)  Resp 20  SpO2 93% Physical Exam  Nursing note and vitals reviewed. Constitutional: He is oriented to person, place, and  time. He appears well-developed and well-nourished. He appears distressed.  HENT:  Head: Normocephalic and atraumatic.  Mouth/Throat: Oropharynx is clear and moist.  Eyes: Conjunctivae and EOM are normal. Pupils are equal, round, and reactive to light.  Neck: Normal range of motion.  Cardiovascular: Regular rhythm and normal heart sounds.   Tachycardic  Pulmonary/Chest: He is in respiratory distress. He has wheezes.  Abdominal: Soft. Bowel sounds are normal. There is no tenderness.  Musculoskeletal: Normal range of motion.  Neurological: He is alert and oriented to person, place, and time. No cranial nerve deficit. He exhibits normal muscle tone. Coordination  normal.  Skin: Skin is warm. No rash noted.    ED Course  Procedures (including critical care time) Labs Review Labs Reviewed  CBC WITH DIFFERENTIAL - Abnormal; Notable for the following:    WBC 16.1 (*)    Platelets 146 (*)    Neutrophils Relative % 86 (*)    Neutro Abs 13.9 (*)    Lymphocytes Relative 6 (*)    Monocytes Absolute 1.3 (*)    All other components within normal limits  BASIC METABOLIC PANEL - Abnormal; Notable for the following:    Sodium 136 (*)    Chloride 94 (*)    Glucose, Bld 107 (*)    BUN 40 (*)    Creatinine, Ser 1.55 (*)    GFR calc non Af Amer 44 (*)    GFR calc Af Amer 51 (*)    All other components within normal limits  PRO B NATRIURETIC PEPTIDE - Abnormal; Notable for the following:    Pro B Natriuretic peptide (BNP) 1388.0 (*)    All other components within normal limits  CULTURE, BLOOD (ROUTINE X 2)  CULTURE, BLOOD (ROUTINE X 2)  BLOOD GAS, ARTERIAL  I-STAT CG4 LACTIC ACID, ED  I-STAT TROPOININ, ED   Results for orders placed during the hospital encounter of 10/28/13  CBC WITH DIFFERENTIAL      Result Value Ref Range   WBC 16.1 (*) 4.0 - 10.5 K/uL   RBC 4.71  4.22 - 5.81 MIL/uL   Hemoglobin 14.6  13.0 - 17.0 g/dL   HCT 42.8  39.0 - 52.0 %   MCV 90.9  78.0 - 100.0 fL   MCH 31.0   26.0 - 34.0 pg   MCHC 34.1  30.0 - 36.0 g/dL   RDW 14.8  11.5 - 15.5 %   Platelets 146 (*) 150 - 400 K/uL   Neutrophils Relative % 86 (*) 43 - 77 %   Neutro Abs 13.9 (*) 1.7 - 7.7 K/uL   Lymphocytes Relative 6 (*) 12 - 46 %   Lymphs Abs 0.9  0.7 - 4.0 K/uL   Monocytes Relative 8  3 - 12 %   Monocytes Absolute 1.3 (*) 0.1 - 1.0 K/uL   Eosinophils Relative 0  0 - 5 %   Eosinophils Absolute 0.0  0.0 - 0.7 K/uL   Basophils Relative 0  0 - 1 %   Basophils Absolute 0.0  0.0 - 0.1 K/uL  BASIC METABOLIC PANEL      Result Value Ref Range   Sodium 136 (*) 137 - 147 mEq/L   Potassium 3.8  3.7 - 5.3 mEq/L   Chloride 94 (*) 96 - 112 mEq/L   CO2 24  19 - 32 mEq/L   Glucose, Bld 107 (*) 70 - 99 mg/dL   BUN 40 (*) 6 - 23 mg/dL   Creatinine, Ser 1.55 (*) 0.50 - 1.35 mg/dL   Calcium 9.8  8.4 - 10.5 mg/dL   GFR calc non Af Amer 44 (*) >90 mL/min   GFR calc Af Amer 51 (*) >90 mL/min  PRO B NATRIURETIC PEPTIDE      Result Value Ref Range   Pro B Natriuretic peptide (BNP) 1388.0 (*) 0 - 125 pg/mL     Imaging Review Dg Chest Port 1 View  10/28/2013   CLINICAL DATA:  Shortness of breath.  Cough.  COPD.  EXAM: PORTABLE CHEST - 1 VIEW  COMPARISON:  DG CHEST 1V PORT dated 10/16/2013  FINDINGS: Indistinct pulmonary vasculature. Heart  size within normal limits. Prior CABG.  No overt airspace opacity. Suspected subsegmental atelectasis along the right hemidiaphragm.  IMPRESSION: 1. Faint interstitial accentuation with indistinct pulmonary vasculature. No overt cardiomegaly although otherwise the appearance resembles pulmonary venous hypertension and borderline interstitial edema. Atypical pneumonia or drug reaction might be differential diagnostic considerations. 2. Subsegmental atelectasis at the right lung base.   Electronically Signed   By: Sherryl Barters M.D.   On: 10/28/2013 19:47     EKG Interpretation None       Date: 10/28/2013  Rate: 114  Rhythm: sinus tachycardia  QRS Axis: normal   Intervals: normal  ST/T Wave abnormalities: nonspecific ST/T changes  Conduction Disutrbances:none  Narrative Interpretation:   Old EKG Reviewed: none available   CRITICAL CARE Performed by: Mervin Kung Total critical care time: 30 Critical care time was exclusive of separately billable procedures and treating other patients. Critical care was necessary to treat or prevent imminent or life-threatening deterioration. Critical care was time spent personally by me on the following activities: development of treatment plan with patient and/or surrogate as well as nursing, discussions with consultants, evaluation of patient's response to treatment, examination of patient, obtaining history from patient or surrogate, ordering and performing treatments and interventions, ordering and review of laboratory studies, ordering and review of radiographic studies, pulse oximetry and re-evaluation of patient's condition.   MDM   Final diagnoses:  COPD exacerbation  HCAP (healthcare-associated pneumonia)    Patient upon arrival still in respiratory distress. Patient with bilateral wheezing on oxygen mask satting about 92%. Patient tripoding and use of accessory muscles. Patient refused BiPAP here. Patient given the continuous nebulizer x-ray done patient put on 4 L nasal cannula because he didn't want the oxygen mask. However his sats remained above 90%. Patient is slowly improved. However he still is moving air well although wheezing has resolved. Chest x-ray raises some concern for developing pneumonia. Patient's does have a leukocytosis with some of this could be due to the prednisone that he been on. Although patient is running a low-grade fever at 100.2 and he still likes had a fever today. His has some persistent tachycardia. Blood cultures will be done lactic acid will be checked arterial blood gas will be done. Will clinically start the patient on each Pneumonia antibiotics. Patient was most  recently in the hospital into January. Patient slowly improving however clinically not out of the wood jet. Patient able to verbalize and follow commands. EKG was consistent with a sinus tachycardia with a rate of 114. 20 care troponin is pending. Patient has a history of CHF no evidence of frank pulmonary edema on the x-ray.  Patient will require admission by the hospitalist.    Mervin Kung, MD 10/28/13 2124

## 2013-10-28 NOTE — ED Notes (Signed)
Bed: RP59 Expected date:  Expected time:  Means of arrival:  Comments: ems shortness of breath

## 2013-10-28 NOTE — ED Notes (Signed)
PER EMS - pt with cough x2 weeks, SOB since last night after being around cigarette smoke.  Pt refused CPAP.  LS insp/exp wheeze and rhonci.  125mg  Solumedrol given, DuoNeb given with improvement of SOB.  Pt on o2 @2L  [place] at baseline.  A+Ox4.

## 2013-10-28 NOTE — H&P (Addendum)
[facility] History and Physical  Lucas [name] NUU:[id number] DOB: 23-Jan-1946 DOA: 10/28/2013  Referring physician: EDP PCP: Salena Saner., MD   Chief Complaint: SOB   HPI: Lucas [name] is a 68 y.o. male who presents to the ED with c/o cough, pleuritic chest pain (with cough), fever, wheezing, SOB.  Cough is productive of purulent yellowish sputum.  Symptoms have onset and progressively worsened over the past 3 days.  Patient also reports that he had to increase his home O2 from 2L to 4L in past few days.  Patient has known history of COPD and CHF.  He thought that this was a CHF exacerbation because he reports several pounds of weight gain in recent weeks, although he has no peripheral edema.  Review of Systems: Systems reviewed.  As above, otherwise negative  Past Medical History  Diagnosis Date  . CAD (coronary artery disease)     a. s/p CABG x 5;  b. 04/2012 Cath: patent LIMA->LAD and VG->OM3, all other grafts occluded.  . Hypertension   . Anxiety   . History of kidney cancer   . Adjustment disorder with anxiety   . Hyperlipidemia   . BPH (benign prostatic hypertrophy)   . COPD (chronic obstructive pulmonary disease)     a. on home O2.  . Arthritis   . Ischemic cardiomyopathy     a. 06/2013 Echo: EF 30-35%, no reg wma's, Gr2 DD, triv AI, mod dil LA, nl RV fxn.  . Chronic systolic CHF (congestive heart failure)     a. 06/2013 Echo: EF 30-35%.  . ADENOCARCINOMA, PROSTATE dx'd 2012  . Renal cancer dx'd 1997    lt nephrectomy   Past Surgical History  Procedure Laterality Date  . Coronary artery bypass graft      x 5  . Cardiac catheterization    . Nephrectomy      left nephrectomy for ca  . Posterior cervical laminectomy      x 8   limited ROM  and can't lie flat  . Heart stents      x 5  . Robot assisted laparoscopic radical prostatectomy      for prostate cancer  . Cystoscopy with litholapaxy  05/01/2012    Procedure: CYSTOSCOPY WITH  LITHOLAPAXY;  Surgeon: Dutch Gray, MD;  Location: [facility];  Service: Urology;  Laterality: N/A;  . Prostate cancer     Social History:  reports that he quit smoking about 7 months ago. His smoking use included Cigarettes. He has a 16.2 pack-year smoking history. He quit smokeless tobacco use about 18 months ago. He reports that he does not drink alcohol or use illicit drugs.  Allergies  Allergen Reactions  . Ativan [Lorazepam] Other (See Comments)    Increases agitation, tolerates xanax  . Lisinopril Cough  . Morphine And Related     Hallucinations, too sedated when given with ativan  . Ibuprofen Hives, Nausea And Vomiting and Rash    Tolerates baby aspirin per patient.      Family History  Problem Relation Age of Onset  . Diabetes Mother   . Coronary artery disease    . Heart disease Mother   . Heart disease Father   . Heart disease Brother   . Diabetes Father      Prior to Admission medications   Medication Sig Start Date End Date Taking? Authorizing Provider  albuterol (PROVENTIL HFA;VENTOLIN HFA) 108 (90 BASE) MCG/ACT inhaler Inhale 2 puffs into the lungs every 6 (six) hours as  needed for wheezing or shortness of breath. Shortness of breath   Yes Historical Provider, MD  albuterol (PROVENTIL) (2.5 MG/3ML) 0.083% nebulizer solution Take 2.5 mg by nebulization 4 (four) times daily. 04/06/13  Yes Erick Colace, NP  ALPRAZolam Duanne Moron) 1 MG tablet Take 1 mg by mouth 3 (three) times daily as needed for anxiety.   Yes Historical Provider, MD  aspirin EC 81 MG tablet Take 81 mg by mouth daily.   Yes Historical Provider, MD  COMBIVENT RESPIMAT 20-100 MCG/ACT AERS respimat Inhale 1 puff into the lungs every 6 (six) hours as needed for wheezing or shortness of breath.  06/04/13  Yes Historical Provider, MD  doxazosin (CARDURA) 8 MG tablet Take 8 mg by mouth at bedtime.    Yes Historical Provider, MD  furosemide (LASIX) 40 MG tablet Take 20 mg by mouth every evening.   Yes Historical  Provider, MD  HYDROcodone-acetaminophen (NORCO/VICODIN) 5-325 MG per tablet Take 1-2 tablets by mouth every 4 (four) hours as needed (pain). 10/16/13  Yes Kathalene Frames, MD  ipratropium (ATROVENT) 0.02 % nebulizer solution Take 0.5 mg by nebulization 4 (four) times daily.   Yes Historical Provider, MD  NITROSTAT 0.3 MG SL tablet Place 0.3 mg under the tongue every 5 (five) minutes as needed for chest pain.  06/15/13  Yes Historical Provider, MD  predniSONE (STERAPRED UNI-PAK) 10 MG tablet Take 6 tabs by mouth daily  for 2 days, then 5 tabs for 2 days, then 4 tabs for 2 days, then 3 tabs for 2 days, 2 tabs for 2 days, then 1 tab by mouth daily for 2 days 10/16/13  Yes Kathalene Frames, MD  valsartan (DIOVAN) 160 MG tablet Take 160 mg by mouth daily. 10/22/13  Yes Burtis Junes, NP   Physical Exam: Filed Vitals:   10/28/13 2128  BP: 117/72  Pulse: 104  Temp: 99.3 F (37.4 C)  Resp: 22    BP 117/72  Pulse 104  Temp(Src) 99.3 F (37.4 C) (Oral)  Resp 22  Ht 5\' 8"  (1.727 m)  Wt 100.699 kg (222 lb)  BMI 33.76 kg/m2  SpO2 94%  General Appearance:    Alert, oriented, no distress, appears stated age  Head:    Normocephalic, atraumatic  Eyes:    PERRL, EOMI, sclera non-icteric        Nose:   Nares without drainage or epistaxis. Mucosa, turbinates normal  Throat:   Moist mucous membranes. Oropharynx without erythema or exudate.  Neck:   Supple. No carotid bruits.  No thyromegaly.  No lymphadenopathy.   Back:     No CVA tenderness, no spinal tenderness  Lungs:     Splinting, and accessory muscle use noted, wheezing noted by EDP seems to have resolved.  Chest wall:    No tenderness to palpitation  Heart:    Tachycardic without murmurs, gallops, rubs  Abdomen:     Soft, non-tender, nondistended, normal bowel sounds, no organomegaly  Genitalia:    deferred  Rectal:    deferred  Extremities:   No clubbing, cyanosis or edema.  Pulses:   2+ and symmetric all extremities  Skin:   Skin color, texture,  turgor normal, no rashes or lesions  Lymph nodes:   Cervical, supraclavicular, and axillary nodes normal  Neurologic:   CNII-XII intact. Normal strength, sensation and reflexes      throughout    Labs on Admission:  Basic Metabolic Panel:  Recent Labs Lab 10/28/13 1910  NA 136*  K 3.8  CL 94*  CO2 24  GLUCOSE 107*  BUN 40*  CREATININE 1.55*  CALCIUM 9.8   Liver Function Tests: No results found for this basename: AST, ALT, ALKPHOS, BILITOT, PROT, ALBUMIN,  in the last 168 hours No results found for this basename: LIPASE, AMYLASE,  in the last 168 hours No results found for this basename: AMMONIA,  in the last 168 hours CBC:  Recent Labs Lab 10/28/13 1910  WBC 16.1*  NEUTROABS 13.9*  HGB 14.6  HCT 42.8  MCV 90.9  PLT 146*   Cardiac Enzymes: No results found for this basename: CKTOTAL, CKMB, CKMBINDEX, TROPONINI,  in the last 168 hours  BNP (last 3 results)  Recent Labs  08/15/13 1219 10/16/13 1120 10/28/13 1910  PROBNP 390.0* 1547.0* 1388.0*   CBG: No results found for this basename: GLUCAP,  in the last 168 hours  Radiological Exams on Admission: Dg Chest Port 1 View  10/28/2013   CLINICAL DATA:  Shortness of breath.  Cough.  COPD.  EXAM: PORTABLE CHEST - 1 VIEW  COMPARISON:  DG CHEST 1V PORT dated 10/16/2013  FINDINGS: Indistinct pulmonary vasculature. Heart size within normal limits. Prior CABG.  No overt airspace opacity. Suspected subsegmental atelectasis along the right hemidiaphragm.  IMPRESSION: 1. Faint interstitial accentuation with indistinct pulmonary vasculature. No overt cardiomegaly although otherwise the appearance resembles pulmonary venous hypertension and borderline interstitial edema. Atypical pneumonia or drug reaction might be differential diagnostic considerations. 2. Subsegmental atelectasis at the right lung base.   Electronically Signed   By: Sherryl Barters M.D.   On: 10/28/2013 19:47    EKG: Independently  reviewed.  Assessment/Plan Principal Problem:   HCAP (healthcare-associated pneumonia) Active Problems:   COPD (chronic obstructive pulmonary disease)   Acute exacerbation of chronic obstructive pulmonary disease (COPD)   Acute-on-chronic respiratory failure   Ischemic cardiomyopathy   Chronic systolic CHF (congestive heart failure)   Sepsis   Acute on chronic hypoxic respiratory failure due to HCAP with resulting COPD exacerbation - treating HCAP per protocol with cefepime and vancomycin, clinically feel that patient does have some degree of infection given his fever of 100.2, purulent sputum, and leukocytosis in ED.  Also starting stress dose steroids for COPD exacerbation.  Despite major improvement in wheezing, patient still having quite a bit of difficulty breathing.  I do not hear any crackles at this time, while CHF may be playing some role, do not feel that this is the major player in his current situation today as he clearly has fever and purulent sputum.  CXR findings are non-specific.  Adult wheeze protocol ordered.  Sepsis - clinically appears to be HCAP as noted above, given recent weight gain and the fact that patient does not appear dry on exam, will hold off on large volumes of fluid for the moment so as to try and not set off his CHF.  Will need to follow this closely.  CKD - his renal function actually appears fairly close to his baseline at this time, follow UOP, if poor consider gentle hydration, but again, patient with severe CHF history and already reporting that he has gained water weight over the past couple of weeks.  Code Status: Full Code  Family Communication: No family in room Disposition Plan: Admit to SDU   Time spent: 6 min  [facility] Pager [phone]  If 7AM-7PM, please contact the day team taking care of the patient [email or web address] Password [facility] 10/28/2013, 10:24 PM

## 2013-10-28 NOTE — ED Notes (Signed)
Pt sitting up on edge of stretcher, remains with 1hr UD at this time, pt reports SOB "a little better".  LS clear upper lobes, rhonci LLL.  Sats maintained at this time.  Skin PWD.  NAD.

## 2013-10-29 ENCOUNTER — Ambulatory Visit: Payer: Medicare Other | Admitting: Nurse Practitioner

## 2013-10-29 ENCOUNTER — Telehealth: Payer: Self-pay | Admitting: Nurse Practitioner

## 2013-10-29 ENCOUNTER — Telehealth: Payer: Self-pay | Admitting: Cardiovascular Disease

## 2013-10-29 DIAGNOSIS — I739 Peripheral vascular disease, unspecified: Secondary | ICD-10-CM

## 2013-10-29 LAB — LEGIONELLA ANTIGEN, URINE: LEGIONELLA ANTIGEN, URINE: NEGATIVE

## 2013-10-29 LAB — EXPECTORATED SPUTUM ASSESSMENT W REFEX TO RESP CULTURE

## 2013-10-29 LAB — EXPECTORATED SPUTUM ASSESSMENT W GRAM STAIN, RFLX TO RESP C

## 2013-10-29 LAB — GRAM STAIN

## 2013-10-29 LAB — STREP PNEUMONIAE URINARY ANTIGEN: Strep Pneumo Urinary Antigen: NEGATIVE

## 2013-10-29 LAB — INFLUENZA PANEL BY PCR (TYPE A & B)
H1N1FLUPCR: NOT DETECTED
Influenza A By PCR: NEGATIVE
Influenza B By PCR: NEGATIVE

## 2013-10-29 LAB — MRSA PCR SCREENING: MRSA by PCR: NEGATIVE

## 2013-10-29 LAB — HIV ANTIBODY (ROUTINE TESTING W REFLEX): HIV: NONREACTIVE

## 2013-10-29 MED ORDER — IRBESARTAN 75 MG PO TABS
75.0000 mg | ORAL_TABLET | Freq: Every day | ORAL | Status: DC
  Filled 2013-10-29 (×2): qty 1

## 2013-10-29 MED ORDER — GUAIFENESIN ER 600 MG PO TB12
1200.0000 mg | ORAL_TABLET | Freq: Two times a day (BID) | ORAL | Status: DC
  Administered 2013-10-29 – 2013-11-02 (×8): 1200 mg via ORAL
  Filled 2013-10-29 (×9): qty 2

## 2013-10-29 MED ORDER — MOMETASONE FURO-FORMOTEROL FUM 100-5 MCG/ACT IN AERO
2.0000 | INHALATION_SPRAY | Freq: Two times a day (BID) | RESPIRATORY_TRACT | Status: DC
  Administered 2013-10-29 – 2013-11-02 (×9): 2 via RESPIRATORY_TRACT
  Filled 2013-10-29: qty 8.8

## 2013-10-29 MED ORDER — VANCOMYCIN HCL 10 G IV SOLR
1500.0000 mg | INTRAVENOUS | Status: DC
  Administered 2013-10-29 – 2013-10-30 (×2): 1500 mg via INTRAVENOUS
  Filled 2013-10-29 (×3): qty 1500

## 2013-10-29 MED ORDER — CEFEPIME HCL 2 G IJ SOLR
2.0000 g | Freq: Two times a day (BID) | INTRAMUSCULAR | Status: DC
  Administered 2013-10-29 – 2013-10-30 (×3): 2 g via INTRAVENOUS
  Filled 2013-10-29 (×5): qty 2

## 2013-10-29 NOTE — Telephone Encounter (Signed)
Patient is in the hospital. He wanted you to be aware and he would like to also talk to you. Please call and advise.

## 2013-10-29 NOTE — Telephone Encounter (Signed)
Follow up    Pt called back to say he wants to have the bi pass done and would like to speak to someone today.  Kept asking for Lucas [name] please.

## 2013-10-29 NOTE — Telephone Encounter (Signed)
I will forward to Dr. Burt Knack.

## 2013-10-29 NOTE — Telephone Encounter (Signed)
I'm fine to refer him to vascular surgery for evaluation.

## 2013-10-29 NOTE — Telephone Encounter (Signed)
S/w pt stated the message was routed to Dr. Burt Knack and it is out of Lori's hands, pt stated want's to have the bypass surgery done I stated have to wait to see what Dr. Burt Knack want's

## 2013-10-29 NOTE — Progress Notes (Signed)
ANTIBIOTIC CONSULT NOTE - INITIAL  Pharmacy Consult for Vancomycin, cefepime Indication: pneumonia  Allergies  Allergen Reactions  . Ativan [Lorazepam] Other (See Comments)    Increases agitation, tolerates xanax  . Lisinopril Cough  . Morphine And Related     Hallucinations, too sedated when given with ativan  . Ibuprofen Hives, Nausea And Vomiting and Rash    Tolerates baby aspirin per patient.      Patient Measurements: Height: 5\' 8"  (172.7 cm) Weight: 222 lb (100.699 kg) IBW/kg (Calculated) : 68.4 Adjusted Body Weight:   Vital Signs: Temp: 97.5 F (36.4 C) (04/20 0000) Temp src: Oral (04/20 0000) BP: 129/62 mmHg (04/20 0200) Pulse Rate: 76 (04/20 0200) Intake/Output from previous day: 04/19 0701 - 04/20 0700 In: 274.5 [P.O.:240; I.V.:34.5] Out: 200 [Urine:200] Intake/Output from this shift: Total I/O In: 274.5 [P.O.:240; I.V.:34.5] Out: 200 [Urine:200]  Labs:  Recent Labs  10/28/13 1910  WBC 16.1*  HGB 14.6  PLT 146*  CREATININE 1.55*   Estimated Creatinine Clearance: 52.5 ml/min (by C-G formula based on Cr of 1.55). No results found for this basename: VANCOTROUGH, VANCOPEAK, VANCORANDOM, GENTTROUGH, GENTPEAK, GENTRANDOM, TOBRATROUGH, TOBRAPEAK, TOBRARND, AMIKACINPEAK, AMIKACINTROU, AMIKACIN,  in the last 72 hours   Microbiology: No results found for this or any previous visit (from the past 720 hour(s)).  Medical History: Past Medical History  Diagnosis Date  . CAD (coronary artery disease)     a. s/p CABG x 5;  b. 04/2012 Cath: patent LIMA->LAD and VG->OM3, all other grafts occluded.  . Hypertension   . Anxiety   . History of kidney cancer   . Adjustment disorder with anxiety   . Hyperlipidemia   . BPH (benign prostatic hypertrophy)   . COPD (chronic obstructive pulmonary disease)     a. on home O2.  . Arthritis   . Ischemic cardiomyopathy     a. 06/2013 Echo: EF 30-35%, no reg wma's, Gr2 DD, triv AI, mod dil LA, nl RV fxn.  . Chronic  systolic CHF (congestive heart failure)     a. 06/2013 Echo: EF 30-35%.  . ADENOCARCINOMA, PROSTATE dx'd 2012  . Renal cancer dx'd 1997    lt nephrectomy    Medications:  Anti-infectives   Start     Dose/Rate Route Frequency Ordered Stop   10/29/13 1200  vancomycin (VANCOCIN) 1,500 mg in sodium chloride 0.9 % 500 mL IVPB     1,500 mg 250 mL/hr over 120 Minutes Intravenous Every 24 hours 10/29/13 0214     10/29/13 0800  ceFEPIme (MAXIPIME) 2 g in dextrose 5 % 50 mL IVPB     2 g 100 mL/hr over 30 Minutes Intravenous Every 12 hours 10/29/13 0214     10/28/13 2130  ceFEPIme (MAXIPIME) 1 g in dextrose 5 % 50 mL IVPB     1 g 100 mL/hr over 30 Minutes Intravenous  Once 10/28/13 2115 10/29/13 0100   10/28/13 2130  vancomycin (VANCOCIN) IVPB 1000 mg/200 mL premix     1,000 mg 200 mL/hr over 60 Minutes Intravenous  Once 10/28/13 2121 10/28/13 2333     Assessment: Patient with HCAP.  First dose of antibiotics already given.   Goal of Therapy:  Vancomycin trough level 15-20 mcg/ml  Plan:  Measure antibiotic drug levels at steady state Follow up culture results Vancomycin 1500mg  iv q24hr, cefepime 2gm iv q12hr  [employer]. 10/29/2013,2:16 AM

## 2013-10-29 NOTE — Progress Notes (Signed)
CARE MANAGEMENT NOTE 10/29/2013  Patient:  Lucas [name], Lucas [name]   Account Number:  [id number]  Date Initiated:  10/29/2013  Documentation initiated by:  Eiza Canniff  Subjective/Objective Assessment:   pt with confirmed pnaumonia     Action/Plan:   home when stable   Anticipated DC Date:  11/01/2013   Anticipated DC Plan:  HOME/SELF CARE  In-house referral  NA      DC Planning Services  NA      [facility] Choice  NA   Choice offered to / List presented to:  NA   DME arranged  NA      DME agency  NA     [place] arranged  NA      [place] agency  NA   Status of service:  In process, will continue to follow Medicare Important Message given?  NA - LOS <3 / Initial given by admissions (If response is "NO", the following Medicare IM given date fields will be blank) Date Medicare IM given:   Date Additional Medicare IM given:    Discharge Disposition:    Per UR Regulation:  Reviewed for med. necessity/level of care/duration of stay  If discussed at [place] of Stay Meetings, dates discussed:    Comments:  [id number]/Etheline Geppert Eldridge Dace, [place], [place] [phone] Chart Reviewed for discharge and hospital needs. Discharge needs at time of review: None present will follow for needs. Review of patient progress due on [id number].

## 2013-10-29 NOTE — Progress Notes (Signed)
Note: This document was prepared with digital dictation and possible smart phrase technology. Any transcriptional errors that result from this process are unintentional.   Lucas [name] QBH:[id number] DOB: May 28, 1946 DOA: 10/28/2013 PCP: Salena Saner., MD  Brief narrative: 68 y/o ?, known CAD, s/p CABG x 5, last cath 8/11 showing severe 3-v Disease, Isch Cardiomyopathy 06/2013 ef 30-35% prostate adenoca s/p Surgery 06/2010, long smoking h/o with Mod-severe COPD on home O2 1 liter,L nephrectomy , Post laminectomy Intermittent claudication admitted with a three-week history of cough which suddenly got worse to the point that he could not read. He states a plus to 3 weeks he been using his inhalers and was prescribed something for cough but could not afford the same. He does not note any increase in weight and has no noncompliance with excess salt in his diet He felt smothered and could not lay down and decided to come to the emergency room On admission he had a low-grade fever was given Solu-Medrol 125 mg and developed some chest pain which is mainly when he coughed. Accessory muscles are in use and he was tripoding pH 7.403, PaO2 77%,  lactic acid 1.8 pro BNP 1388, and BUN/creatinine 40/1.55 [Baseline 27/1.28]  Past medical history-As per Problem list Chart reviewed as below-  reviewed  Consultants:   none  Procedures:   chest x-ray  Antibiotics:   cefepime  Vancomycin   Subjective   feels better. Still has some chest cough with and discomfort No fevers or chills Tolerating breakfast include no nausea no vomiting and  more comfortable on 3 L of O2 rather than the usual 1-2 L at home    Objective    Interim History:  none  Telemetry:  PVCs   Objective: Filed Vitals:   10/29/13 0200 10/29/13 0400 10/29/13 0514 10/29/13 0600  BP: 129/62 140/50  127/47  Pulse: 76 78  50  Temp:  97.5 F (36.4 C)    TempSrc:  Oral    Resp: 27 22  31   Height:      Weight:       SpO2: 100% 100% 100% 100%    Intake/Output Summary (Last 24 hours) at 10/29/13 0706 Last data filed at 10/29/13 0600  Gross per 24 hour  Intake  554.5 ml  Output    400 ml  Net  154.5 ml    Exam:  General:  EOMI, NCAT Cardiovascular:  S1-S2 no murmur rub or gallop Respiratory:  Clinically clear Abdomen:  soft nontender nondistended no rebound or guarding Skin no lower extremity edema no rash Neuro intact  Data Reviewed: Basic Metabolic Panel:  Recent Labs Lab 10/28/13 1910  NA 136*  K 3.8  CL 94*  CO2 24  GLUCOSE 107*  BUN 40*  CREATININE 1.55*  CALCIUM 9.8   Liver Function Tests: No results found for this basename: AST, ALT, ALKPHOS, BILITOT, PROT, ALBUMIN,  in the last 168 hours No results found for this basename: LIPASE, AMYLASE,  in the last 168 hours No results found for this basename: AMMONIA,  in the last 168 hours CBC:  Recent Labs Lab 10/28/13 1910  WBC 16.1*  NEUTROABS 13.9*  HGB 14.6  HCT 42.8  MCV 90.9  PLT 146*   Cardiac Enzymes: No results found for this basename: CKTOTAL, CKMB, CKMBINDEX, TROPONINI,  in the last 168 hours BNP: No components found with this basename: POCBNP,  CBG: No results found for this basename: GLUCAP,  in the last 168 hours  Recent Results (  from the past 240 hour(s))  MRSA PCR SCREENING     Status: None   Collection Time    10/28/13 11:12 PM      Result Value Ref Range Status   MRSA by PCR NEGATIVE  NEGATIVE Final   Comment:            The GeneXpert MRSA Assay (FDA     approved for NASAL specimens     only), is one component of a     comprehensive MRSA colonization     surveillance program. It is not     intended to diagnose MRSA     infection nor to guide or     monitor treatment for     MRSA infections.     Studies:              All Imaging reviewed and is as per above notation   Scheduled Meds: . sodium chloride   Intravenous STAT  . aspirin EC  81 mg Oral Daily  . ceFEPime (MAXIPIME) IV  2 g  Intravenous Q12H  . doxazosin  8 mg Oral QHS  . furosemide  20 mg Oral QPM  . heparin  5,000 Units Subcutaneous 3 times per day  . ipratropium-albuterol  3 mL Nebulization QID  . irbesartan  150 mg Oral Daily  . methylPREDNISolone (SOLU-MEDROL) injection  125 mg Intravenous Once  . methylPREDNISolone (SOLU-MEDROL) injection  60 mg Intravenous Q12H  . vancomycin  1,500 mg Intravenous Q24H   Continuous Infusions:    Assessment/Plan: 1.  community-acquired pneumonia-continue vancomycin/cefepime. CBC plus differential a.m. and eats to chest x-ray in about 4 weeks to rule out persistence/malignancy hemodynamically stable and we'll monitor on step down for one day 2. moderate to severe COPD-continue Solu-Medrol 125  every 12.  Continue duoneb 4 times a day.  Consider addition of Spiriva or Dulera.  Transition to prednisone soon 3. Severe CAD-continue Cardura a milligram, irbesartan 150 mg. Unclear why not on beta blocker. ACE inhibitor relatively contraindicated given creatinine 4. Acute kidney injury-hold Lasix for now.  Irbesartan 150-->75 daily.  Monitor labs in am.  HOld IV fluid given poor heart function 5. Isch cardiomyopathy-montor on tele.  No s/sym ischemia currently 6. Htn-continue meds as above. In addition continue doxazosin 8 mg each bedtime 7. History prostate cancer-continue doxazosin 8 mg each bedtime 8. History of renal cell cancer in the past-stable 9.  History of intermittent vascular claudication secondary to diffuse stenosis left superficial femoral artery-Foley for medical management. Potentially could have stenting of the occluded SFA as an outpatient which was scheduled for 4/20 but this will have to be reviewed and discussed with the patient and Dr. Burt Knack as an outpatient  Code Status: Full Family Communication: No family currently at bedside Disposition Plan: Step down today   Verneita Griffes, MD  [facility] Pager [phone] 10/29/2013, 7:06 AM    LOS: 1 day

## 2013-10-29 NOTE — Telephone Encounter (Signed)
S/w pt is admitted today for pneumonia wanted to discuss with Cecille Rubin and Dr. Burt Knack today that pt does not want another balloon pt wants to go for the bypass surgery stated Dr. Burt Knack couldn't even due a cath due to pt's balloon and afraid it would pop. Stated I will send to [facility]

## 2013-10-29 NOTE — Plan of Care (Signed)
Problem: ICU Phase Progression Outcomes Goal: Pain controlled with appropriate interventions Outcome: Progressing Has chronic back and shoulder pain.

## 2013-10-30 ENCOUNTER — Encounter (HOSPITAL_COMMUNITY): Payer: Medicare Other

## 2013-10-30 LAB — CBC
HCT: 39.9 % (ref 39.0–52.0)
Hemoglobin: 13.2 g/dL (ref 13.0–17.0)
MCH: 30.3 pg (ref 26.0–34.0)
MCHC: 33.1 g/dL (ref 30.0–36.0)
MCV: 91.5 fL (ref 78.0–100.0)
PLATELETS: 171 10*3/uL (ref 150–400)
RBC: 4.36 MIL/uL (ref 4.22–5.81)
RDW: 14.7 % (ref 11.5–15.5)
WBC: 17.7 10*3/uL — AB (ref 4.0–10.5)

## 2013-10-30 LAB — COMPREHENSIVE METABOLIC PANEL
ALBUMIN: 3.5 g/dL (ref 3.5–5.2)
ALT: 24 U/L (ref 0–53)
AST: 30 U/L (ref 0–37)
Alkaline Phosphatase: 54 U/L (ref 39–117)
BILIRUBIN TOTAL: 0.3 mg/dL (ref 0.3–1.2)
BUN: 55 mg/dL — AB (ref 6–23)
CHLORIDE: 98 meq/L (ref 96–112)
CO2: 22 mEq/L (ref 19–32)
CREATININE: 1.8 mg/dL — AB (ref 0.50–1.35)
Calcium: 9.4 mg/dL (ref 8.4–10.5)
GFR calc Af Amer: 43 mL/min — ABNORMAL LOW (ref >90)
GFR calc non Af Amer: 37 mL/min — ABNORMAL LOW (ref >90)
Glucose, Bld: 149 mg/dL — ABNORMAL HIGH (ref 70–99)
Potassium: 4.7 mEq/L (ref 3.7–5.3)
Sodium: 135 mEq/L — ABNORMAL LOW (ref 137–147)
Total Protein: 7.1 g/dL (ref 6.0–8.3)

## 2013-10-30 MED ORDER — DEXTROMETHORPHAN POLISTIREX 30 MG/5ML PO LQCR
15.0000 mg | Freq: Two times a day (BID) | ORAL | Status: DC | PRN
  Administered 2013-10-30 – 2013-10-31 (×2): 30 mg via ORAL
  Filled 2013-10-30 (×2): qty 5

## 2013-10-30 MED ORDER — DEXTROSE 5 % IV SOLN
2.0000 g | INTRAVENOUS | Status: DC
  Administered 2013-10-31: 2 g via INTRAVENOUS
  Filled 2013-10-30: qty 2

## 2013-10-30 NOTE — Progress Notes (Signed)
Note: This document was prepared with digital dictation and possible smart phrase technology. Any transcriptional errors that result from this process are unintentional.   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 He states a plus to 3 weeks he been using his inhalers and was prescribed something for cough but could not afford the same. He does not note any increase in weight and has no noncompliance with excess salt in his diet He felt smothered and could not lay down and decided to come to the emergency room On admission he had a low-grade fever was given Solu-Medrol 125 mg and developed some chest pain which is mainly when he coughed. Accessory muscles are in use and he was tripoding pH 7.403, PaO2 77%,  lactic acid 1.8 pro BNP 1388, and BUN/creatinine 40/1.55 [Baseline 27/1.28] Admitted and started on coverage for Pneumonia  Past medical history-As per Problem list Chart reviewed as below-  reviewed  Consultants:   none  Procedures:   chest x-ray  Antibiotics:   cefepime  Vancomycin   Subjective  Doing fair only Feels a little worse han yesterday. ambulation causes LLE pain-understands this needs to be rx as OP and that PNA needs to resolve + sputum yellow NO Cp or Le edema   Objective    Interim History:  none  Telemetry:  PVCs   Objective: Filed Vitals:   10/30/13 0513 10/30/13 0629 10/30/13 0828 10/30/13 1305  BP: 145/77   149/68  Pulse: 70   79  Temp: 97.8 F (36.6 C)   97.3 F (36.3 C)  TempSrc: Oral   Oral  Resp:  18   20  Height:      Weight: 100.064 kg (220 lb 9.6 oz)     SpO2: 95% 97% 97% 92%    Intake/Output Summary (Last 24 hours) at 10/30/13 1321 Last data filed at 10/30/13 0514  Gross per 24 hour  Intake    450 ml  Output    300 ml  Net    150 ml    Exam:  General:  EOMI, NCAT Cardiovascular:  S1-S2 no murmur rub or gallop Respiratory:  Clinically clear Abdomen:  soft nontender nondistended no rebound or guarding Skin no lower extremity edema no rash Neuro intact  Data Reviewed: Basic Metabolic Panel:  Recent Labs Lab 10/28/13 1910 10/30/13 0335  NA 136* 135*  K 3.8 4.7  CL 94* 98  CO2 24 22  GLUCOSE 107* 149*  BUN 40* 55*  CREATININE 1.55* 1.80*  CALCIUM 9.8 9.4   Liver Function Tests:  Recent Labs Lab 10/30/13 0335  AST 30  ALT 24  ALKPHOS 54  BILITOT 0.3  PROT 7.1  ALBUMIN 3.5   No results found for this basename: LIPASE, AMYLASE,  in the last 168 hours No results found for this basename: AMMONIA,  in the last 168 hours CBC:  Recent Labs Lab 10/28/13 1910 10/30/13 0335  WBC 16.1* 17.7*  NEUTROABS 13.9*  --   HGB 14.6 13.2  HCT 42.8 39.9  MCV 90.9 91.5  PLT 146* 171   Cardiac Enzymes: No  results found for this basename: CKTOTAL, CKMB, CKMBINDEX, TROPONINI,  in the last 168 hours BNP: No components found with this basename: POCBNP,  CBG: No results found for this basename: GLUCAP,  in the last 168 hours  Recent Results (from the past 240 hour(s))  CULTURE, BLOOD (ROUTINE X 2)     Status: None   Collection Time    10/28/13  9:20 PM      Result Value Ref Range Status   Specimen Description BLOOD RIGHT FOREARM   Final   Special Requests BOTTLES DRAWN AEROBIC AND ANAEROBIC 4CC   Final   Culture  Setup Time     Final   Value: 10/29/2013 02:49     Performed at [facility]   Culture     Final   Value:        BLOOD CULTURE RECEIVED NO GROWTH TO DATE CULTURE WILL BE HELD FOR 5 DAYS BEFORE ISSUING A FINAL NEGATIVE REPORT     Performed  at [facility]   Report Status PENDING   Incomplete  CULTURE, BLOOD (ROUTINE X 2)     Status: None   Collection Time    10/28/13  9:25 PM      Result Value Ref Range Status   Specimen Description BLOOD RIGHT ANTECUBITAL   Final   Special Requests BOTTLES DRAWN AEROBIC AND ANAEROBIC 5CC   Final   Culture  Setup Time     Final   Value: 10/29/2013 02:49     Performed at [facility]   Culture     Final   Value:        BLOOD CULTURE RECEIVED NO GROWTH TO DATE CULTURE WILL BE HELD FOR 5 DAYS BEFORE ISSUING A FINAL NEGATIVE REPORT     Performed at [facility]   Report Status PENDING   Incomplete  MRSA PCR SCREENING     Status: None   Collection Time    10/28/13 11:12 PM      Result Value Ref Range Status   MRSA by PCR NEGATIVE  NEGATIVE Final   Comment:            The GeneXpert MRSA Assay (FDA     approved for NASAL specimens     only), is one component of a     comprehensive MRSA colonization     surveillance program. It is not     intended to diagnose MRSA     infection nor to guide or     monitor treatment for     MRSA infections.  CULTURE, EXPECTORATED SPUTUM-ASSESSMENT     Status: None   Collection Time    10/29/13  8:35 AM      Result Value Ref Range Status   Specimen Description SPUTUM   Final   Special Requests NONE   Final   Sputum evaluation     Final   Value: THIS SPECIMEN IS ACCEPTABLE. RESPIRATORY CULTURE REPORT TO FOLLOW.   Report Status 10/29/2013 FINAL   Final  GRAM STAIN     Status: None   Collection Time    10/29/13  8:35 AM      Result Value Ref Range Status   Specimen Description SPUTUM   Final   Special Requests NONE   Final   Gram Stain     Final   Value: RARE GRAM NEGATIVE RODS     ABUNDANT WBC PRESENT,BOTH PMN AND MONONUCLEAR     Gram Stain Report Called to,Read Back  By and Verified With: [place] [id number] BY POTEAT,S   Report Status 10/29/2013 FINAL   Final  CULTURE, RESPIRATORY (NON-EXPECTORATED)     Status:  None   Collection Time    10/29/13  8:35 AM      Result Value Ref Range Status   Specimen Description SPUTUM   Final   Special Requests NONE   Final   Gram Stain     Final   Value: ABUNDANT WBC PRESENT,BOTH PMN AND MONONUCLEAR     NO SQUAMOUS EPITHELIAL CELLS SEEN     NO ORGANISMS SEEN     Performed at [facility]   Culture     Final   Value: NORMAL OROPHARYNGEAL FLORA     Performed at [facility]   Report Status PENDING   Incomplete     Studies:              All Imaging reviewed and is as per above notation   Scheduled Meds: . aspirin EC  81 mg Oral Daily  . ceFEPime (MAXIPIME) IV  2 g Intravenous Q12H  . doxazosin  8 mg Oral QHS  . guaiFENesin  1,200 mg Oral BID  . heparin  5,000 Units Subcutaneous 3 times per day  . ipratropium-albuterol  3 mL Nebulization QID  . methylPREDNISolone (SOLU-MEDROL) injection  125 mg Intravenous Once  . methylPREDNISolone (SOLU-MEDROL) injection  60 mg Intravenous Q12H  . mometasone-formoterol  2 puff Inhalation BID  . vancomycin  1,500 mg Intravenous Q24H   Continuous Infusions:    Assessment/Plan: 1.  Community-acquired pneumonia-continue vancomycin/cefepime for 1-2 more days until feels a little better. CBC plus differential a.m. and eats to chest x-ray in about 4 weeks to rule out persistence/malignancy hemodynamically stable transferred to tele 2. moderate to severe COPD-continue Solu-Medrol 125  every 12.  Continue duoneb 4 times a day.  Consider addition of Spiriva or Dulera.  Transition to prednisone soon 3. Severe CAD-continue Cardura a milligram, irbesartan 150 mg. Unclear why not on beta blocker. ACE inhibitor relatively contraindicated given creatinine Acute kidney injury-hold Lasix for now.  Irbesartan 150-->75 ???discontinued given worsening renal insufficiency.  Monitor labs in am.  Hold IV fluid given poor heart function 4. Isch cardiomyopathy-montor on tele.  No s/sym ischemia currently 5. Htn-continue meds  as above. In addition continue doxazosin 8 mg each bedtime 6. History prostate cancer-continue doxazosin 8 mg each bedtime 7. History of renal cell cancer in the past-stable 8.  History of intermittent vascular claudication secondary to diffuse stenosis left superficial femoral artery- for medical management. Potentially could have stenting of the occluded SFA as an outpatient which was scheduled for 4/20 but this will have to be reviewed and discussed with the patient and Dr. Burt Knack as an outpatient  Code Status: Full Family Communication: No family currently at bedside-attempted to call x2.  No answer Disposition Plan: tele   Verneita Griffes, MD  [facility] Pager [phone] 10/30/2013, 1:21 PM    LOS: 2 days

## 2013-10-30 NOTE — Telephone Encounter (Addendum)
Order placed for referral to [facility] to discuss possible revascularization.   ----- Message from Sherren Mocha, MD sent at 10/29/2013 10:49 PM -----     Ander Purpura - could you refer him to [facility] for right leg claudication - known SFA occlusion. thx

## 2013-10-30 NOTE — Progress Notes (Signed)
[place] for Vancomycin, cefepime Indication: pneumonia  Allergies  Allergen Reactions  . Ativan [Lorazepam] Other (See Comments)    Increases agitation, tolerates xanax  . Lisinopril Cough  . Morphine And Related     Hallucinations, too sedated when given with ativan  . Ibuprofen Hives, Nausea And Vomiting and Rash    Tolerates baby aspirin per patient.      Patient Measurements: Height: 5\' 9"  (175.3 cm) Weight: 220 lb 9.6 oz (100.064 kg) IBW/kg (Calculated) : 70.7 Adjusted Body Weight:   Vital Signs: Temp: 97.3 F (36.3 C) (04/21 1305) Temp src: Oral (04/21 1305) BP: 149/68 mmHg (04/21 1305) Pulse Rate: 79 (04/21 1305) Intake/Output from previous day: 04/20 0701 - 04/21 0700 In: 1690 [P.O.:960; I.V.:30; IV Piggyback:600] Out: 800 [Urine:800] Intake/Output from this shift:    Labs:  Recent Labs  10/28/13 1910 10/30/13 0335  WBC 16.1* 17.7*  HGB 14.6 13.2  PLT 146* 171  CREATININE 1.55* 1.80*   Estimated Creatinine Clearance: 45.8 ml/min (by C-G formula based on Cr of 1.8). No results found for this basename: VANCOTROUGH, VANCOPEAK, VANCORANDOM, [place], GENTPEAK, GENTRANDOM, TOBRATROUGH, TOBRAPEAK, TOBRARND, AMIKACINPEAK, AMIKACINTROU, AMIKACIN,  in the last 72 hours   Microbiology: Recent Results (from the past 720 hour(s))  CULTURE, BLOOD (ROUTINE X 2)     Status: None   Collection Time    10/28/13  9:20 PM      Result Value Ref Range Status   Specimen Description BLOOD RIGHT FOREARM   Final   Special Requests BOTTLES DRAWN AEROBIC AND ANAEROBIC 4CC   Final   Culture  Setup Time     Final   Value: 10/29/2013 02:49     Performed at [facility]   Culture     Final   Value:        BLOOD CULTURE RECEIVED NO GROWTH TO DATE CULTURE WILL BE HELD FOR 5 DAYS BEFORE ISSUING A FINAL NEGATIVE REPORT     Performed at [facility]   Report Status PENDING   Incomplete  CULTURE, BLOOD (ROUTINE X 2)     Status:  None   Collection Time    10/28/13  9:25 PM      Result Value Ref Range Status   Specimen Description BLOOD RIGHT ANTECUBITAL   Final   Special Requests BOTTLES DRAWN AEROBIC AND ANAEROBIC 5CC   Final   Culture  Setup Time     Final   Value: 10/29/2013 02:49     Performed at [facility]   Culture     Final   Value:        BLOOD CULTURE RECEIVED NO GROWTH TO DATE CULTURE WILL BE HELD FOR 5 DAYS BEFORE ISSUING A FINAL NEGATIVE REPORT     Performed at [facility]   Report Status PENDING   Incomplete  MRSA PCR SCREENING     Status: None   Collection Time    10/28/13 11:12 PM      Result Value Ref Range Status   MRSA by PCR NEGATIVE  NEGATIVE Final   Comment:            The GeneXpert MRSA Assay (FDA     approved for NASAL specimens     only), is one component of a     comprehensive MRSA colonization     surveillance program. It is not     intended to diagnose MRSA     infection nor to guide or  monitor treatment for     MRSA infections.  CULTURE, EXPECTORATED SPUTUM-ASSESSMENT     Status: None   Collection Time    10/29/13  8:35 AM      Result Value Ref Range Status   Specimen Description SPUTUM   Final   Special Requests NONE   Final   Sputum evaluation     Final   Value: THIS SPECIMEN IS ACCEPTABLE. RESPIRATORY CULTURE REPORT TO FOLLOW.   Report Status 10/29/2013 FINAL   Final  GRAM STAIN     Status: None   Collection Time    10/29/13  8:35 AM      Result Value Ref Range Status   Specimen Description SPUTUM   Final   Special Requests NONE   Final   Gram Stain     Final   Value: RARE GRAM NEGATIVE RODS     ABUNDANT WBC PRESENT,BOTH PMN AND MONONUCLEAR     Gram Stain Report Called to,Read Back By and Verified With: [facility] AT 0944 ON [id number] BY POTEAT,S   Report Status 10/29/2013 FINAL   Final  CULTURE, RESPIRATORY (NON-EXPECTORATED)     Status: None   Collection Time    10/29/13  8:35 AM      Result Value Ref Range Status   Specimen Description  SPUTUM   Final   Special Requests NONE   Final   Gram Stain     Final   Value: ABUNDANT WBC PRESENT,BOTH PMN AND MONONUCLEAR     NO SQUAMOUS EPITHELIAL CELLS SEEN     NO ORGANISMS SEEN     Performed at [facility]   Culture     Final   Value: NORMAL OROPHARYNGEAL FLORA     Performed at [facility]   Report Status PENDING   Incomplete    Medical History: Past Medical History  Diagnosis Date  . CAD (coronary artery disease)     a. s/p CABG x 5;  b. 04/2012 Cath: patent LIMA->LAD and VG->OM3, all other grafts occluded.  . Hypertension   . Anxiety   . History of kidney cancer   . Adjustment disorder with anxiety   . Hyperlipidemia   . BPH (benign prostatic hypertrophy)   . COPD (chronic obstructive pulmonary disease)     a. on home O2.  . Arthritis   . Ischemic cardiomyopathy     a. 06/2013 Echo: EF 30-35%, no reg wma's, Gr2 DD, triv AI, mod dil LA, nl RV fxn.  . Chronic systolic CHF (congestive heart failure)     a. 06/2013 Echo: EF 30-35%.  . ADENOCARCINOMA, PROSTATE dx'd 2012  . Renal cancer dx'd 1997    lt nephrectomy    Medications:  Anti-infectives   Start     Dose/Rate Route Frequency Ordered Stop   10/29/13 1200  vancomycin (VANCOCIN) 1,500 mg in sodium chloride 0.9 % 500 mL IVPB     1,500 mg 250 mL/hr over 120 Minutes Intravenous Every 24 hours 10/29/13 0214     10/29/13 0800  ceFEPIme (MAXIPIME) 2 g in dextrose 5 % 50 mL IVPB     2 g 100 mL/hr over 30 Minutes Intravenous Every 12 hours 10/29/13 0214     10/28/13 2130  ceFEPIme (MAXIPIME) 1 g in dextrose 5 % 50 mL IVPB     1 g 100 mL/hr over 30 Minutes Intravenous  Once 10/28/13 2115 10/29/13 0100   10/28/13 2130  vancomycin (VANCOCIN) IVPB 1000 mg/200 mL premix     1,000  mg 200 mL/hr over 60 Minutes Intravenous  Once 10/28/13 2121 10/28/13 2333     Assessment: 48 yoM with known CAD s/p CABG x 5 (last cath '11), ICM, EF 30-35%, hx prostate and renal cell CA, mod--severe COPD on home 02,  admitted with 3 week hx cough which acutely worsened. Started on Vanc/Cefepime per RX.   10/29/2013 >> vanc >> 10/29/2013 >> cefepime >>  Tmax: 100.2 > Afeb WBCs: Rising, now 17.7K (also on steroids) Renal: AKI - worsening, SCr 1.55-->1.80 CrCl ~ 46 ml/min CG (40 N)  4/19 MRSA PCR neg 4/19 blood x2: NGTD 4/20 sputum gram stain: rare GNRs 4/20 sputum cx: normal flora  Goal of Therapy:  Vancomycin trough level 15-20 mcg/ml Eradication of infection  Plan:  Change to Cefepime 2g IV q24h due to worsening renal fxn Continue Vanc 1500mg  q24h F/u cultures, renal fxn, clinical course, vanc levels as warranted  Ralene Bathe, PharmD, BCPS 10/30/2013, 2:55 PM  Pager: [phone]

## 2013-10-31 ENCOUNTER — Encounter (HOSPITAL_COMMUNITY): Payer: Self-pay | Admitting: *Deleted

## 2013-10-31 DIAGNOSIS — R7309 Other abnormal glucose: Secondary | ICD-10-CM

## 2013-10-31 LAB — CULTURE, RESPIRATORY W GRAM STAIN

## 2013-10-31 LAB — CULTURE, RESPIRATORY

## 2013-10-31 MED ORDER — METHYLPREDNISOLONE SODIUM SUCC 125 MG IJ SOLR
60.0000 mg | Freq: Four times a day (QID) | INTRAMUSCULAR | Status: DC
  Administered 2013-10-31 – 2013-11-01 (×4): 60 mg via INTRAVENOUS
  Filled 2013-10-31 (×8): qty 0.96

## 2013-10-31 MED ORDER — IPRATROPIUM-ALBUTEROL 0.5-2.5 (3) MG/3ML IN SOLN
3.0000 mL | Freq: Four times a day (QID) | RESPIRATORY_TRACT | Status: DC
  Administered 2013-10-31 – 2013-11-02 (×9): 3 mL via RESPIRATORY_TRACT
  Filled 2013-10-31 (×9): qty 3

## 2013-10-31 MED ORDER — LEVOFLOXACIN 500 MG PO TABS
500.0000 mg | ORAL_TABLET | Freq: Every day | ORAL | Status: DC
  Administered 2013-10-31 – 2013-11-02 (×3): 500 mg via ORAL
  Filled 2013-10-31 (×3): qty 1

## 2013-10-31 MED ORDER — NICOTINE 14 MG/24HR TD PT24
14.0000 mg | MEDICATED_PATCH | Freq: Every day | TRANSDERMAL | Status: DC
  Administered 2013-10-31 – 2013-11-02 (×3): 14 mg via TRANSDERMAL
  Filled 2013-10-31 (×3): qty 1

## 2013-10-31 NOTE — Progress Notes (Signed)
Pt ambulated one lap around unit w/ steady, indep gait w/ stand by assist.  Pt did have some noticeable dyspnea w/ exertion but maintained his O2 sat at 91% on 2lnc during ambulation.

## 2013-10-31 NOTE — Progress Notes (Signed)
Came to visit patient at bedside.He is active with [facility]. He states he has not been as receptive here of late with the [facility] because he has been concerned about having femoral artery procedure. He endorses that he will be more receptive at discharge. He wears home oxygen that is supplied through [facility]. He also states he could benefit from home health RN at discharge as well. Reports he has had [facility] in the past. He will continue to be followed by [facility]. He will receive post hospital discharge calls and will be evaluated for monthly home visits. Made inpatient RN case manager aware. Will continue to follow.  Marthenia Rolling, MSN-Ed, RN,BSN- [facility] [phone]

## 2013-10-31 NOTE — Progress Notes (Addendum)
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 He states a plus to 3 weeks he been using his inhalers and was prescribed something for cough but could not afford the same. He does not note any increase in weight and has no noncompliance with excess salt in his diet He felt smothered and could not lay down and decided to come to the emergency room He was admitted with COPD exacerbation +/- pneumonia  Assessment/Plan: Community-acquired pneumonia  -s/p 2 days of vancomycin/cefepime  -cultures negative  -change to PO levaquin  COPD exacerbation   -continue IV solumedrol, duonebs, levaquin,    -add dulera  Severe CAD-continue Cardura a milligram, not on BB likely due to severe COPD, ACE inhibitor relatively contraindicated given creatinine  Acute kidney injury on CKD  -hold Lasix and ARB - baseline around 1.4-1.5  Isch cardiomyopathy -EF of 30-35% -Lasix, ARB on hold due to AKI -compensated at this time -monitor weights, I/Os  History prostate cancer -continue doxazosin 8 mg each bedtime  PVD/History of intermittent vascular claudication secondary to diffuse stenosis left superficial femoral artery - Fu with Dr.Cooper and [facility], high surgical risk  DVT proph: heparin SQ  Code Status: Full Family Communication: No family currently at bedside- Disposition Plan: tele  Consultants:   none  Procedures:   chest x-ray  Antibiotics:   cefepime -4/22  Vancomycin  levaquin 4/22   Subjective  Still quite short of breath, little improved compared to  yesterday   Objective    Interim History:  none   Objective: Filed Vitals:   10/31/13 0551 10/31/13 0900 10/31/13 0924 10/31/13 1302  BP: 159/80   133/62  Pulse: 78   74  Temp:    98.2 F (36.8 C)  TempSrc:    Oral  Resp: 22  22 20   Height:      Weight:      SpO2: 95% 96% 93% 95%    Intake/Output Summary (Last 24 hours) at 10/31/13 1432 Last data filed at 10/31/13 1303  Gross per 24 hour  Intake    530 ml  Output    300 ml  Net    230 ml    Exam:  General:  EOMI, NCAT Cardiovascular:  S1-S2 no murmur rub or gallop Respiratory: poor air movement bilaterally Abdomen:  soft nontender nondistended no rebound or guarding Skin no lower extremity edema no rash Neuro intact  Data Reviewed: Basic Metabolic Panel:  Recent Labs Lab 10/28/13 1910 10/30/13 0335  NA 136* 135*  K 3.8 4.7  CL 94* 98  CO2 24 22  GLUCOSE 107* 149*  BUN 40* 55*  CREATININE 1.55* 1.80*  CALCIUM 9.8 9.4   Liver Function Tests:  Recent Labs Lab 10/30/13 0335  AST 30  ALT 24  ALKPHOS 54  BILITOT 0.3  PROT 7.1  ALBUMIN 3.5   No results found for this basename: LIPASE, AMYLASE,  in the last 168 hours No results found for this basename: AMMONIA,  in the last 168 hours CBC:  Recent Labs Lab 10/28/13 1910 10/30/13 0335  WBC 16.1* 17.7*  NEUTROABS 13.9*  --  HGB 14.6 13.2  HCT 42.8 39.9  MCV 90.9 91.5  PLT 146* 171   Cardiac Enzymes: No results found for this basename: CKTOTAL, CKMB, CKMBINDEX, TROPONINI,  in the last 168 hours BNP: No components found with this basename: POCBNP,  CBG: No results found for this basename: GLUCAP,  in the last 168 hours  Recent Results (from the past 240 hour(s))  CULTURE, BLOOD (ROUTINE X 2)     Status: None   Collection Time    10/28/13  9:20 PM      Result Value Ref Range Status   Specimen Description BLOOD RIGHT FOREARM   Final   Special Requests BOTTLES DRAWN AEROBIC AND ANAEROBIC 4CC   Final   Culture  Setup Time     Final    Value: 10/29/2013 02:49     Performed at [facility]   Culture     Final   Value:        BLOOD CULTURE RECEIVED NO GROWTH TO DATE CULTURE WILL BE HELD FOR 5 DAYS BEFORE ISSUING A FINAL NEGATIVE REPORT     Performed at [facility]   Report Status PENDING   Incomplete  CULTURE, BLOOD (ROUTINE X 2)     Status: None   Collection Time    10/28/13  9:25 PM      Result Value Ref Range Status   Specimen Description BLOOD RIGHT ANTECUBITAL   Final   Special Requests BOTTLES DRAWN AEROBIC AND ANAEROBIC 5CC   Final   Culture  Setup Time     Final   Value: 10/29/2013 02:49     Performed at [facility]   Culture     Final   Value:        BLOOD CULTURE RECEIVED NO GROWTH TO DATE CULTURE WILL BE HELD FOR 5 DAYS BEFORE ISSUING A FINAL NEGATIVE REPORT     Performed at [facility]   Report Status PENDING   Incomplete  MRSA PCR SCREENING     Status: None   Collection Time    10/28/13 11:12 PM      Result Value Ref Range Status   MRSA by PCR NEGATIVE  NEGATIVE Final   Comment:            The GeneXpert MRSA Assay (FDA     approved for NASAL specimens     only), is one component of a     comprehensive MRSA colonization     surveillance program. It is not     intended to diagnose MRSA     infection nor to guide or     monitor treatment for     MRSA infections.  CULTURE, EXPECTORATED SPUTUM-ASSESSMENT     Status: None   Collection Time    10/29/13  8:35 AM      Result Value Ref Range Status   Specimen Description SPUTUM   Final   Special Requests NONE   Final   Sputum evaluation     Final   Value: THIS SPECIMEN IS ACCEPTABLE. RESPIRATORY CULTURE REPORT TO FOLLOW.   Report Status 10/29/2013 FINAL   Final  GRAM STAIN     Status: None   Collection Time    10/29/13  8:35 AM      Result Value Ref Range Status   Specimen Description SPUTUM   Final   Special Requests NONE   Final   Gram Stain     Final   Value: RARE GRAM NEGATIVE RODS  ABUNDANT WBC  PRESENT,BOTH PMN AND MONONUCLEAR     Gram Stain Report Called to,Read Back By and Verified With: [facility] AT 0944 ON [id number] BY POTEAT,S   Report Status 10/29/2013 FINAL   Final  CULTURE, RESPIRATORY (NON-EXPECTORATED)     Status: None   Collection Time    10/29/13  8:35 AM      Result Value Ref Range Status   Specimen Description SPUTUM   Final   Special Requests NONE   Final   Gram Stain     Final   Value: ABUNDANT WBC PRESENT,BOTH PMN AND MONONUCLEAR     NO SQUAMOUS EPITHELIAL CELLS SEEN     NO ORGANISMS SEEN     Performed at [facility]   Culture     Final   Value: FEW CANDIDA ALBICANS     Performed at [facility]   Report Status 10/31/2013 FINAL   Final     Studies:              All Imaging reviewed and is as per above notation   Scheduled Meds: . aspirin EC  81 mg Oral Daily  . doxazosin  8 mg Oral QHS  . guaiFENesin  1,200 mg Oral BID  . heparin  5,000 Units Subcutaneous 3 times per day  . ipratropium-albuterol  3 mL Nebulization Q6H WA  . levofloxacin  500 mg Oral Daily  . methylPREDNISolone (SOLU-MEDROL) injection  125 mg Intravenous Once  . methylPREDNISolone (SOLU-MEDROL) injection  60 mg Intravenous Q6H  . mometasone-formoterol  2 puff Inhalation BID   Continuous Infusions:   Domenic Polite, MD  [facility] Pager [phone] 10/31/2013, 2:32 PM    LOS: 3 days

## 2013-11-01 ENCOUNTER — Encounter (HOSPITAL_COMMUNITY): Payer: Medicare Other

## 2013-11-01 DIAGNOSIS — I5043 Acute on chronic combined systolic (congestive) and diastolic (congestive) heart failure: Secondary | ICD-10-CM

## 2013-11-01 LAB — BASIC METABOLIC PANEL
BUN: 47 mg/dL — ABNORMAL HIGH (ref 6–23)
CHLORIDE: 101 meq/L (ref 96–112)
CO2: 24 mEq/L (ref 19–32)
Calcium: 9.7 mg/dL (ref 8.4–10.5)
Creatinine, Ser: 1.42 mg/dL — ABNORMAL HIGH (ref 0.50–1.35)
GFR calc Af Amer: 57 mL/min — ABNORMAL LOW (ref >90)
GFR calc non Af Amer: 49 mL/min — ABNORMAL LOW (ref >90)
GLUCOSE: 199 mg/dL — AB (ref 70–99)
POTASSIUM: 4.8 meq/L (ref 3.7–5.3)
Sodium: 137 mEq/L (ref 137–147)

## 2013-11-01 LAB — CBC
HCT: 39.9 % (ref 39.0–52.0)
HEMOGLOBIN: 12.9 g/dL — AB (ref 13.0–17.0)
MCH: 30.4 pg (ref 26.0–34.0)
MCHC: 32.3 g/dL (ref 30.0–36.0)
MCV: 94.1 fL (ref 78.0–100.0)
Platelets: 164 10*3/uL (ref 150–400)
RBC: 4.24 MIL/uL (ref 4.22–5.81)
RDW: 14.6 % (ref 11.5–15.5)
WBC: 12.4 10*3/uL — AB (ref 4.0–10.5)

## 2013-11-01 MED ORDER — PREDNISONE 50 MG PO TABS
50.0000 mg | ORAL_TABLET | Freq: Every day | ORAL | Status: DC
  Administered 2013-11-02: 50 mg via ORAL
  Filled 2013-11-01 (×2): qty 1

## 2013-11-01 MED ORDER — FUROSEMIDE 40 MG PO TABS
40.0000 mg | ORAL_TABLET | Freq: Every day | ORAL | Status: DC
  Administered 2013-11-01: 40 mg via ORAL
  Filled 2013-11-01 (×2): qty 1

## 2013-11-01 MED ORDER — METHYLPREDNISOLONE SODIUM SUCC 40 MG IJ SOLR
40.0000 mg | Freq: Four times a day (QID) | INTRAMUSCULAR | Status: AC
  Administered 2013-11-01 (×3): 40 mg via INTRAVENOUS
  Filled 2013-11-01 (×3): qty 1

## 2013-11-01 NOTE — Progress Notes (Signed)
CARE MANAGEMENT NOTE 11/01/2013  Patient:  Lucas [name], Lucas [name]   Account Number:  [id number]  Date Initiated:  11/01/2013  Documentation initiated by:  Gabriel Earing  Subjective/Objective Assessment:   pt admitted with SOB, COPD, PNA     Action/Plan:   from home   Anticipated DC Date:  11/02/2013   Anticipated DC Plan:  [place]  CM consult      [place]   Choice offered to / List presented to:  C-1 Patient        [place] arranged  HH-1 RN  [place].   Status of service:  In process, will continue to follow Medicare Important Message given?  NO (If response is "NO", the following Medicare IM given date fields will be blank) Date Medicare IM given:   Date Additional Medicare IM given:    Discharge Disposition:    Per UR Regulation:  Reviewed for med. necessity/level of care/duration of stay  If discussed at [place] of Stay Meetings, dates discussed:    Comments:  11/01/13 MMcGibboney, RN, BSN Pt felt smothered and could not lay down and decided to come to the emergency room He was admitted with COPD exacerbation +/- pneumonia. Pt is active with [facility], [facility] for HHRN, Resp, COPD program/PT. Will need orders to continue with HHRN, Resp and COPD program/HHPT.

## 2013-11-01 NOTE — Clinical Documentation Improvement (Signed)
Patient presents with Pneumonia, Acute on Chronic Respiratory Failure, COPD Exacerbation, ARF, Chronic Systolic Heart Failure. Sepsis is documented in H&P by Dr. Alcario Drought but no where else.    WBC = 16.1  IV Vancomycin, Cefepime and Levaquin initiated    After study, please clarify the cause of admit  Sepsis               Sepsis ruled out - please document if you feel it did                Pneumonia               Other condition     Thank You, Zoila Shutter ,RN Clinical Documentation Specialist:  [place] [facility]

## 2013-11-01 NOTE — Progress Notes (Signed)
Received patient at 2300. Agree with previous assessment. Will continue to monitor pt. Blanchard Kelch, RN

## 2013-11-01 NOTE — Progress Notes (Signed)
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 He states a plus to 3 weeks he been using his inhalers and was prescribed something for cough but could not afford the same. He does not note any increase in weight and has no noncompliance with excess salt in his diet He felt smothered and could not lay down and decided to come to the emergency room He was admitted with COPD exacerbation +/- pneumonia  Assessment/Plan: Community-acquired pneumonia  -s/p 2 days of vancomycin/cefepime  -cultures negative  -changed to PO levaquin 4/22  COPD exacerbation   -improving   -cut down solumedrol, duonebs, levaquin,    -add dulera   -change to Prednisone in am  Severe CAD-continue Cardura a milligram, not on BB likely due to severe COPD, ACE inhibitor relatively contraindicated given creatinine  Acute kidney injury on CKD  -back to baseline - baseline around 1.4-1.5 -resume lasix  Isch cardiomyopathy -EF of 30-35% -ARB on hold due to AKI -compensated at this time -monitor weights, I/Os -resume lasix  History prostate cancer -continue doxazosin 8 mg each bedtime  PVD/History of intermittent vascular claudication secondary to diffuse stenosis left superficial femoral artery - Fu with Dr.Cooper and [facility], high surgical risk  DVT proph: heparin SQ  Code Status: Full Family Communication: No family currently at bedside- Disposition Plan: home tomorrow if stable  Consultants:   none  Procedures:   chest x-ray  Antibiotics:   cefepime  -4/22  Vancomycin  levaquin 4/22   Subjective  Breathing finally improving   Objective    Interim History:  none   Objective: Filed Vitals:   11/01/13 0513 11/01/13 0610 11/01/13 0942 11/01/13 1433  BP:  168/85  138/60  Pulse:  66  74  Temp:  97.7 F (36.5 C)  98.1 F (36.7 C)  TempSrc:  Oral  Oral  Resp:  22  22  Height:      Weight: 101.1 kg (222 lb 14.2 oz)     SpO2:  97% 96% 95%    Intake/Output Summary (Last 24 hours) at 11/01/13 1437 Last data filed at 11/01/13 1300  Gross per 24 hour  Intake   1180 ml  Output   1000 ml  Net    180 ml    Exam:  General:  EOMI, NCAT Cardiovascular:  S1-S2 no murmur rub or gallop Respiratory: poor air movement bilaterally Abdomen:  soft nontender nondistended no rebound or guarding Skin no lower extremity edema no rash Neuro intact  Data Reviewed: Basic Metabolic Panel:  Recent Labs Lab 10/28/13 1910 10/30/13 0335 11/01/13 0408  NA 136* 135* 137  K 3.8 4.7 4.8  CL 94* 98 101  CO2 24 22 24   GLUCOSE 107* 149* 199*  BUN 40* 55* 47*  CREATININE 1.55* 1.80* 1.42*  CALCIUM 9.8 9.4 9.7   Liver Function Tests:  Recent Labs Lab 10/30/13 0335  AST 30  ALT 24  ALKPHOS 54  BILITOT 0.3  PROT 7.1  ALBUMIN 3.5   No results found for this basename: LIPASE, AMYLASE,  in the last 168 hours No results found for  this basename: AMMONIA,  in the last 168 hours CBC:  Recent Labs Lab 10/28/13 1910 10/30/13 0335 11/01/13 0408  WBC 16.1* 17.7* 12.4*  NEUTROABS 13.9*  --   --   HGB 14.6 13.2 12.9*  HCT 42.8 39.9 39.9  MCV 90.9 91.5 94.1  PLT 146* 171 164   Cardiac Enzymes: No results found for this basename: CKTOTAL, CKMB, CKMBINDEX, TROPONINI,  in the last 168 hours BNP: No components found with this basename: POCBNP,  CBG: No results found for this basename: GLUCAP,  in the last 168 hours  Recent Results (from the past 240 hour(s))  CULTURE, BLOOD (ROUTINE X 2)     Status: None   Collection Time     10/28/13  9:20 PM      Result Value Ref Range Status   Specimen Description BLOOD RIGHT FOREARM   Final   Special Requests BOTTLES DRAWN AEROBIC AND ANAEROBIC 4CC   Final   Culture  Setup Time     Final   Value: 10/29/2013 02:49     Performed at [facility]   Culture     Final   Value:        BLOOD CULTURE RECEIVED NO GROWTH TO DATE CULTURE WILL BE HELD FOR 5 DAYS BEFORE ISSUING A FINAL NEGATIVE REPORT     Performed at [facility]   Report Status PENDING   Incomplete  CULTURE, BLOOD (ROUTINE X 2)     Status: None   Collection Time    10/28/13  9:25 PM      Result Value Ref Range Status   Specimen Description BLOOD RIGHT ANTECUBITAL   Final   Special Requests BOTTLES DRAWN AEROBIC AND ANAEROBIC 5CC   Final   Culture  Setup Time     Final   Value: 10/29/2013 02:49     Performed at [facility]   Culture     Final   Value:        BLOOD CULTURE RECEIVED NO GROWTH TO DATE CULTURE WILL BE HELD FOR 5 DAYS BEFORE ISSUING A FINAL NEGATIVE REPORT     Performed at [facility]   Report Status PENDING   Incomplete  MRSA PCR SCREENING     Status: None   Collection Time    10/28/13 11:12 PM      Result Value Ref Range Status   MRSA by PCR NEGATIVE  NEGATIVE Final   Comment:            The GeneXpert MRSA Assay (FDA     approved for NASAL specimens     only), is one component of a     comprehensive MRSA colonization     surveillance program. It is not     intended to diagnose MRSA     infection nor to guide or     monitor treatment for     MRSA infections.  CULTURE, EXPECTORATED SPUTUM-ASSESSMENT     Status: None   Collection Time    10/29/13  8:35 AM      Result Value Ref Range Status   Specimen Description SPUTUM   Final   Special Requests NONE   Final   Sputum evaluation     Final   Value: THIS SPECIMEN IS ACCEPTABLE. RESPIRATORY CULTURE REPORT TO FOLLOW.   Report Status 10/29/2013 FINAL   Final  GRAM STAIN     Status: None   Collection Time     10/29/13  8:35 AM  Result Value Ref Range Status   Specimen Description SPUTUM   Final   Special Requests NONE   Final   Gram Stain     Final   Value: RARE GRAM NEGATIVE RODS     ABUNDANT WBC PRESENT,BOTH PMN AND MONONUCLEAR     Gram Stain Report Called to,Read Back By and Verified With: [facility] AT 0944 ON [id number] BY POTEAT,S   Report Status 10/29/2013 FINAL   Final  CULTURE, RESPIRATORY (NON-EXPECTORATED)     Status: None   Collection Time    10/29/13  8:35 AM      Result Value Ref Range Status   Specimen Description SPUTUM   Final   Special Requests NONE   Final   Gram Stain     Final   Value: ABUNDANT WBC PRESENT,BOTH PMN AND MONONUCLEAR     NO SQUAMOUS EPITHELIAL CELLS SEEN     NO ORGANISMS SEEN     Performed at [facility]   Culture     Final   Value: FEW CANDIDA ALBICANS     Performed at [facility]   Report Status 10/31/2013 FINAL   Final     Studies:              All Imaging reviewed and is as per above notation   Scheduled Meds: . aspirin EC  81 mg Oral Daily  . doxazosin  8 mg Oral QHS  . furosemide  40 mg Oral Daily  . guaiFENesin  1,200 mg Oral BID  . heparin  5,000 Units Subcutaneous 3 times per day  . ipratropium-albuterol  3 mL Nebulization Q6H WA  . levofloxacin  500 mg Oral Daily  . methylPREDNISolone (SOLU-MEDROL) injection  125 mg Intravenous Once  . methylPREDNISolone (SOLU-MEDROL) injection  40 mg Intravenous Q6H  . mometasone-formoterol  2 puff Inhalation BID  . nicotine  14 mg Transdermal Daily  . [START ON 11/02/2013] predniSONE  50 mg Oral Q breakfast   Continuous Infusions:   Domenic Polite, MD  [facility] Pager [phone] 11/01/2013, 2:37 PM    LOS: 4 days

## 2013-11-01 NOTE — Progress Notes (Signed)
PT Cancellation/Screen Note  Patient Details Name: Lucas [name] MRN: [id number] DOB: 05-Mar-1946   Cancelled Treatment:    Reason Eval/Treat Not Completed: PT screened, no needs identified, will sign off--spoke briefly with pt. Pt standing in doorway of room (waiting on RN/MD so he can go home). Pt reports he is ambulating without supervision/assist-"Ive already made several laps around here.". Pt denied needs for PT services. Will sign off. thanks.    [name], MPT Pager: [phone]

## 2013-11-02 DIAGNOSIS — F4322 Adjustment disorder with anxiety: Secondary | ICD-10-CM

## 2013-11-02 LAB — BASIC METABOLIC PANEL
BUN: 44 mg/dL — AB (ref 6–23)
CALCIUM: 9.2 mg/dL (ref 8.4–10.5)
CO2: 28 meq/L (ref 19–32)
Chloride: 99 mEq/L (ref 96–112)
Creatinine, Ser: 1.46 mg/dL — ABNORMAL HIGH (ref 0.50–1.35)
GFR calc Af Amer: 55 mL/min — ABNORMAL LOW (ref >90)
GFR calc non Af Amer: 48 mL/min — ABNORMAL LOW (ref >90)
GLUCOSE: 153 mg/dL — AB (ref 70–99)
Potassium: 4.8 mEq/L (ref 3.7–5.3)
Sodium: 136 mEq/L — ABNORMAL LOW (ref 137–147)

## 2013-11-02 LAB — CBC
HEMATOCRIT: 39.4 % (ref 39.0–52.0)
HEMOGLOBIN: 12.8 g/dL — AB (ref 13.0–17.0)
MCH: 30 pg (ref 26.0–34.0)
MCHC: 32.5 g/dL (ref 30.0–36.0)
MCV: 92.3 fL (ref 78.0–100.0)
Platelets: 176 10*3/uL (ref 150–400)
RBC: 4.27 MIL/uL (ref 4.22–5.81)
RDW: 14.3 % (ref 11.5–15.5)
WBC: 13.5 10*3/uL — ABNORMAL HIGH (ref 4.0–10.5)

## 2013-11-02 MED ORDER — NICOTINE 14 MG/24HR TD PT24: 14.0000 mg | MEDICATED_PATCH | Freq: Every day | TRANSDERMAL | Status: DC

## 2013-11-02 MED ORDER — PREDNISONE 10 MG PO TABS: ORAL_TABLET | ORAL | Status: DC

## 2013-11-02 MED ORDER — VALSARTAN 40 MG PO TABS: 40.0000 mg | ORAL_TABLET | Freq: Every day | ORAL | Status: DC

## 2013-11-02 MED ORDER — IPRATROPIUM-ALBUTEROL 20-100 MCG/ACT IN AERS: 1.0000 | INHALATION_SPRAY | Freq: Four times a day (QID) | RESPIRATORY_TRACT | Status: DC | PRN

## 2013-11-02 MED ORDER — MOMETASONE FURO-FORMOTEROL FUM 100-5 MCG/ACT IN AERO: 2.0000 | INHALATION_SPRAY | Freq: Two times a day (BID) | RESPIRATORY_TRACT | Status: DC

## 2013-11-02 MED ORDER — LEVOFLOXACIN 500 MG PO TABS: 500.0000 mg | ORAL_TABLET | Freq: Every day | ORAL | Status: DC

## 2013-11-02 MED ORDER — ALBUTEROL SULFATE HFA 108 (90 BASE) MCG/ACT IN AERS: 2.0000 | INHALATION_SPRAY | Freq: Four times a day (QID) | RESPIRATORY_TRACT | Status: DC | PRN

## 2013-11-02 NOTE — Progress Notes (Signed)
Spoke with pt concerning Home O2, [employer] is his provider. Explained to pt that we could request a portable O2 for him to travel home with.  Pt states that he lives 3-4 miles from the hospital, someone is picking him up at 11:00 AM, he is going straight home. Pt continued to say that he will be alright, he can make it that far with no oxygen.

## 2013-11-02 NOTE — Progress Notes (Signed)
D/C instructions reviewed w/ pt, pt verbalizes understanding and all questions answered. Pt does not have portable O2 tank here and states he has "no one I trust in my house" to bring tank from home to him here. Case manager called and made arrangement for pt's O2 company to have portable tank delivered here but pt declined. States he has only a 53min drive home and believes he will be fine. Pt d/c in w/c in stable condition to friend's car by NT. Pt in possession of d/c instructions, scripts, and all personal belongings.

## 2013-11-02 NOTE — Telephone Encounter (Signed)
I spoke with the pt and made him aware of 12/10/13 appointment with [facility].  I made the pt aware that we will cancel appt with Dr Fletcher Anon on 11/13/13.

## 2013-11-03 NOTE — Discharge Summary (Signed)
Physician Discharge Summary  Lucas [name] [id number] DOB: [birth year] DOA: 10/28/2013  PCP: Salena Saner., MD  Admit date: 10/28/2013 Discharge date: 11/02/2013  Time spent: 45 minutes  Recommendations for Outpatient Follow-up:  1. PCP in 1 week 2. [facility] in 1 week 3. Dr.Ramaswamy in 2weeks  Discharge Diagnoses:  Principal Problem:   Community acquired pneumonia   COPD exacerbation   Acute exacerbation of chronic obstructive pulmonary disease (COPD)   Acute-on-chronic respiratory failure   Ischemic cardiomyopathy EF of [id number]   Chronic systolic CHF (congestive heart failure)   Sepsis   Chronic resp failure   PVD   H/o Prostate CA  Discharge Condition: stable  Diet recommendation: low sodium, heart healthy  Filed Weights   10/31/13 0537 11/01/13 0513 11/02/13 0536  Weight: 101.8 kg (224 lb 6.9 oz) 101.1 kg (222 lb 14.2 oz) 101.197 kg (223 lb 1.6 oz)    History of present illness:  Lucas [name] is a 68 y.o. male who presents to the ED with c/o cough, pleuritic chest pain (with cough), fever, wheezing, SOB. Cough is productive of purulent yellowish sputum. Symptoms have onset and progressively worsened over the past 3 days. Patient also reports that he had to increase his home O2 from 2L to 4L in past few days. Patient has known history of COPD and CHF. He thought that this was a CHF exacerbation because he reports several pounds of weight gain in recent weeks, although he has no peripheral edema.  Hospital Course:  Community-acquired pneumonia  -s/p 2 days of vancomycin/cefepime  -cultures negative  -changed to PO levaquin 4/22 will complete 7day course -clinically improved  COPD exacerbation  -improving  -was on IV solumedrol, changed to Prednisone today,  -duonebs, levaquin,  -added dulera, reportedly allergic to spiriva -clinically much improved -chronically on 2L Home O2  Severe CAD-continue Cardura a milligram, not on BB likely due to severe COPD,    Acute kidney injury on CKD -back to baseline  - baseline around 1.4-1.5  -resumed lasix   Isch cardiomyopathy  -EF of 30-35%  -ARB on hold initially due to AKI  -compensated at this time  -monitor weights, I/Os  -resumed lasix and ARB at low dose  History prostate cancer  -continue doxazosin 8 mg each bedtime   PVD/History of intermittent vascular claudication secondary to diffuse stenosis left superficial femoral artery  - Fu with Dr.Cooper and [facility], high surgical risk      Discharge Exam: Filed Vitals:   11/02/13 0536  BP: 166/72  Pulse: 56  Temp: 97.6 F (36.4 C)  Resp: 20    General: AAOx3 Cardiovascular: S1S2/RRR Respiratory: improved air movement  Discharge Instructions You were cared for by a hospitalist during your hospital stay. If you have any questions about your discharge medications or the care you received while you were in the hospital after you are discharged, you can call the unit and asked to speak with the hospitalist on call if the hospitalist that took care of you is not available. Once you are discharged, your primary care physician will handle any further medical issues. Please note that NO REFILLS for any discharge medications will be authorized once you are discharged, as it is imperative that you return to your primary care physician (or establish a relationship with a primary care physician if you do not have one) for your aftercare needs so that they can reassess your need for medications and monitor your lab values.  Discharge Orders   Future Appointments Provider  Department Dept Phone   11/15/2013 9:15 AM Josue Hector, MD [place] [phone]   11/30/2013 9:00 AM Melvenia Needles, NP [facility] [phone]   12/10/2013 1:00 PM Serafina Mitchell, MD [facility]   Future Orders Complete By Expires   Diet - low sodium heart healthy  As directed    Increase activity  slowly  As directed        Medication List    STOP taking these medications       predniSONE 10 MG tablet  Commonly known as:  STERAPRED UNI-PAK  Replaced by:  predniSONE 10 MG tablet      TAKE these medications       albuterol (2.5 MG/3ML) 0.083% nebulizer solution  Commonly known as:  PROVENTIL  Take 2.5 mg by nebulization 4 (four) times daily.     albuterol 108 (90 BASE) MCG/ACT inhaler  Commonly known as:  PROVENTIL HFA;VENTOLIN HFA  Inhale 2 puffs into the lungs every 6 (six) hours as needed for wheezing or shortness of breath. Shortness of breath     ALPRAZolam 1 MG tablet  Commonly known as:  XANAX  Take 1 mg by mouth 3 (three) times daily as needed for anxiety.     aspirin EC 81 MG tablet  Take 81 mg by mouth daily.     doxazosin 8 MG tablet  Commonly known as:  CARDURA  Take 8 mg by mouth at bedtime.     furosemide 40 MG tablet  Commonly known as:  LASIX  Take 20 mg by mouth every evening.     HYDROcodone-acetaminophen 5-325 MG per tablet  Commonly known as:  NORCO/VICODIN  Take 1-2 tablets by mouth every 4 (four) hours as needed (pain).     ipratropium 0.02 % nebulizer solution  Commonly known as:  ATROVENT  Take 0.5 mg by nebulization 4 (four) times daily.     Ipratropium-Albuterol 20-100 MCG/ACT Aers respimat  Commonly known as:  COMBIVENT RESPIMAT  Inhale 1 puff into the lungs every 6 (six) hours as needed for wheezing or shortness of breath.     levofloxacin 500 MG tablet  Commonly known as:  LEVAQUIN  Take 1 tablet (500 mg total) by mouth daily. For 2 days     mometasone-formoterol 100-5 MCG/ACT Aero  Commonly known as:  DULERA  Inhale 2 puffs into the lungs 2 (two) times daily.     nicotine 14 mg/24hr patch  Commonly known as:  NICODERM CQ - dosed in mg/24 hours  Place 1 patch (14 mg total) onto the skin daily.     NITROSTAT 0.3 MG SL tablet  Generic drug:  nitroGLYCERIN  Place 0.3 mg under the tongue every 5 (five) minutes as needed for  chest pain.     predniSONE 10 MG tablet  Commonly known as:  DELTASONE  Take 40mg  for 1 day then 30mg  for 1day then 20mg  for 1day then 10mg  for 1day then STOP     valsartan 40 MG tablet  Commonly known as:  DIOVAN  Take 1 tablet (40 mg total) by mouth daily.       Allergies  Allergen Reactions  . Ativan [Lorazepam] Other (See Comments)    Increases agitation, tolerates xanax  . Lisinopril Cough  . Morphine And Related     Hallucinations, too sedated when given with ativan  . Ibuprofen Hives, Nausea And Vomiting and Rash    Tolerates baby aspirin per patient.  Follow-up Information   Follow up with Salena Saner., MD. Schedule an appointment as soon as possible for a visit in 1 week.   Specialty:  Internal Medicine   Contact information:   [address] [place] [address] [phone]        The results of significant diagnostics from this hospitalization (including imaging, microbiology, ancillary and laboratory) are listed below for reference.    Significant Diagnostic Studies: Dg Chest 2 View  10/09/2013   CLINICAL DATA:  Shortness of breath, cough, chest pain, smoking history  EXAM: CHEST  2 VIEW  COMPARISON:  Chest x-ray of 08/22/2013  FINDINGS: No active infiltrate or effusion is seen. Slightly prominent interstitial markings remain with mild peribronchial thickening, consistent with chronic bronchitis. Cardiomegaly is stable. Median sternotomy sutures are noted from prior CABG. There are degenerative changes throughout the thoracic spine.  IMPRESSION: No active lung disease. Stable cardiomegaly and probable mild chronic interstitial change.   Electronically Signed   By: Ivar Drape M.D.   On: 10/09/2013 13:33   Dg Chest Port 1 View  10/28/2013   CLINICAL DATA:  Shortness of breath.  Cough.  COPD.  EXAM: PORTABLE CHEST - 1 VIEW  COMPARISON:  DG CHEST 1V PORT dated 10/16/2013  FINDINGS: Indistinct pulmonary vasculature. Heart size within normal  limits. Prior CABG.  No overt airspace opacity. Suspected subsegmental atelectasis along the right hemidiaphragm.  IMPRESSION: 1. Faint interstitial accentuation with indistinct pulmonary vasculature. No overt cardiomegaly although otherwise the appearance resembles pulmonary venous hypertension and borderline interstitial edema. Atypical pneumonia or drug reaction might be differential diagnostic considerations. 2. Subsegmental atelectasis at the right lung base.   Electronically Signed   By: Sherryl Barters M.D.   On: 10/28/2013 19:47   Dg Chest Port 1 View  10/16/2013   CLINICAL DATA:  Left upper chest pain  EXAM: PORTABLE CHEST - 1 VIEW  COMPARISON:  10/09/2013  FINDINGS: Changes of CABG. Lungs are clear without infiltrate or effusion. Negative for heart failure. No change from the prior study.  IMPRESSION: No active disease.   Electronically Signed   By: Franchot Gallo M.D.   On: 10/16/2013 11:19    Microbiology: Recent Results (from the past 240 hour(s))  CULTURE, BLOOD (ROUTINE X 2)     Status: None   Collection Time    10/28/13  9:20 PM      Result Value Ref Range Status   Specimen Description BLOOD RIGHT FOREARM   Final   Special Requests BOTTLES DRAWN AEROBIC AND ANAEROBIC 4CC   Final   Culture  Setup Time     Final   Value: 10/29/2013 02:49     Performed at [facility]   Culture     Final   Value:        BLOOD CULTURE RECEIVED NO GROWTH TO DATE CULTURE WILL BE HELD FOR 5 DAYS BEFORE ISSUING A FINAL NEGATIVE REPORT     Performed at [facility]   Report Status PENDING   Incomplete  CULTURE, BLOOD (ROUTINE X 2)     Status: None   Collection Time    10/28/13  9:25 PM      Result Value Ref Range Status   Specimen Description BLOOD RIGHT ANTECUBITAL   Final   Special Requests BOTTLES DRAWN AEROBIC AND ANAEROBIC 5CC   Final   Culture  Setup Time     Final   Value: 10/29/2013 02:49     Performed at [facility]  Culture     Final   Value:        BLOOD  CULTURE RECEIVED NO GROWTH TO DATE CULTURE WILL BE HELD FOR 5 DAYS BEFORE ISSUING A FINAL NEGATIVE REPORT     Performed at [facility]   Report Status PENDING   Incomplete  MRSA PCR SCREENING     Status: None   Collection Time    10/28/13 11:12 PM      Result Value Ref Range Status   MRSA by PCR NEGATIVE  NEGATIVE Final   Comment:            The GeneXpert MRSA Assay (FDA     approved for NASAL specimens     only), is one component of a     comprehensive MRSA colonization     surveillance program. It is not     intended to diagnose MRSA     infection nor to guide or     monitor treatment for     MRSA infections.  CULTURE, EXPECTORATED SPUTUM-ASSESSMENT     Status: None   Collection Time    10/29/13  8:35 AM      Result Value Ref Range Status   Specimen Description SPUTUM   Final   Special Requests NONE   Final   Sputum evaluation     Final   Value: THIS SPECIMEN IS ACCEPTABLE. RESPIRATORY CULTURE REPORT TO FOLLOW.   Report Status 10/29/2013 FINAL   Final  GRAM STAIN     Status: None   Collection Time    10/29/13  8:35 AM      Result Value Ref Range Status   Specimen Description SPUTUM   Final   Special Requests NONE   Final   Gram Stain     Final   Value: RARE GRAM NEGATIVE RODS     ABUNDANT WBC PRESENT,BOTH PMN AND MONONUCLEAR     Gram Stain Report Called to,Read Back By and Verified With: [facility] AT 0944 ON [id number] BY POTEAT,S   Report Status 10/29/2013 FINAL   Final  CULTURE, RESPIRATORY (NON-EXPECTORATED)     Status: None   Collection Time    10/29/13  8:35 AM      Result Value Ref Range Status   Specimen Description SPUTUM   Final   Special Requests NONE   Final   Gram Stain     Final   Value: ABUNDANT WBC PRESENT,BOTH PMN AND MONONUCLEAR     NO SQUAMOUS EPITHELIAL CELLS SEEN     NO ORGANISMS SEEN     Performed at [facility]   Culture     Final   Value: FEW CANDIDA ALBICANS     Performed at [facility]   Report Status 10/31/2013  FINAL   Final     Labs: Basic Metabolic Panel:  Recent Labs Lab 10/28/13 1910 10/30/13 0335 11/01/13 0408 11/02/13 0350  NA 136* 135* 137 136*  K 3.8 4.7 4.8 4.8  CL 94* 98 101 99  CO2 24 22 24 28   GLUCOSE 107* 149* 199* 153*  BUN 40* 55* 47* 44*  CREATININE 1.55* 1.80* 1.42* 1.46*  CALCIUM 9.8 9.4 9.7 9.2   Liver Function Tests:  Recent Labs Lab 10/30/13 0335  AST 30  ALT 24  ALKPHOS 54  BILITOT 0.3  PROT 7.1  ALBUMIN 3.5   No results found for this basename: LIPASE, AMYLASE,  in the last 168 hours No results found for this basename: AMMONIA,  in  the last 168 hours CBC:  Recent Labs Lab 10/28/13 1910 10/30/13 0335 11/01/13 0408 11/02/13 0350  WBC 16.1* 17.7* 12.4* 13.5*  NEUTROABS 13.9*  --   --   --   HGB 14.6 13.2 12.9* 12.8*  HCT 42.8 39.9 39.9 39.4  MCV 90.9 91.5 94.1 92.3  PLT 146* 171 164 176   Cardiac Enzymes: No results found for this basename: CKTOTAL, CKMB, CKMBINDEX, TROPONINI,  in the last 168 hours BNP: BNP (last 3 results)  Recent Labs  08/15/13 1219 10/16/13 1120 10/28/13 1910  PROBNP 390.0* 1547.0* 1388.0*   CBG: No results found for this basename: GLUCAP,  in the last 168 hours     Signed:  [facility] 11/03/2013, 4:27 PM

## 2013-11-04 LAB — CULTURE, BLOOD (ROUTINE X 2)
CULTURE: NO GROWTH
Culture: NO GROWTH

## 2013-11-05 ENCOUNTER — Inpatient Hospital Stay (HOSPITAL_COMMUNITY)
Admission: EM | Admit: 2013-11-05 | Discharge: 2013-11-09 | DRG: 190 | Disposition: A | Discharge status: Home Health | Payer: PRIVATE HEALTH INSURANCE | Attending: Internal Medicine | Admitting: Internal Medicine

## 2013-11-05 ENCOUNTER — Emergency Department (HOSPITAL_COMMUNITY): Payer: PRIVATE HEALTH INSURANCE

## 2013-11-05 ENCOUNTER — Telehealth: Payer: Self-pay | Admitting: Internal Medicine

## 2013-11-05 ENCOUNTER — Encounter (HOSPITAL_COMMUNITY): Payer: Self-pay

## 2013-11-05 DIAGNOSIS — Z9861 Coronary angioplasty status: Secondary | ICD-10-CM

## 2013-11-05 DIAGNOSIS — Z79899 Other long term (current) drug therapy: Secondary | ICD-10-CM

## 2013-11-05 DIAGNOSIS — M129 Arthropathy, unspecified: Secondary | ICD-10-CM | POA: Diagnosis present

## 2013-11-05 DIAGNOSIS — I509 Heart failure, unspecified: Secondary | ICD-10-CM | POA: Diagnosis present

## 2013-11-05 DIAGNOSIS — Z833 Family history of diabetes mellitus: Secondary | ICD-10-CM

## 2013-11-05 DIAGNOSIS — R7303 Prediabetes: Secondary | ICD-10-CM

## 2013-11-05 DIAGNOSIS — Z888 Allergy status to other drugs, medicaments and biological substances status: Secondary | ICD-10-CM

## 2013-11-05 DIAGNOSIS — I129 Hypertensive chronic kidney disease with stage 1 through stage 4 chronic kidney disease, or unspecified chronic kidney disease: Secondary | ICD-10-CM | POA: Diagnosis present

## 2013-11-05 DIAGNOSIS — I5022 Chronic systolic (congestive) heart failure: Secondary | ICD-10-CM

## 2013-11-05 DIAGNOSIS — J189 Pneumonia, unspecified organism: Secondary | ICD-10-CM

## 2013-11-05 DIAGNOSIS — I4891 Unspecified atrial fibrillation: Secondary | ICD-10-CM

## 2013-11-05 DIAGNOSIS — E669 Obesity, unspecified: Secondary | ICD-10-CM

## 2013-11-05 DIAGNOSIS — N400 Enlarged prostate without lower urinary tract symptoms: Secondary | ICD-10-CM | POA: Diagnosis present

## 2013-11-05 DIAGNOSIS — T502X5A Adverse effect of carbonic-anhydrase inhibitors, benzothiadiazides and other diuretics, initial encounter: Secondary | ICD-10-CM | POA: Diagnosis present

## 2013-11-05 DIAGNOSIS — R300 Dysuria: Secondary | ICD-10-CM

## 2013-11-05 DIAGNOSIS — Z129 Encounter for screening for malignant neoplasm, site unspecified: Secondary | ICD-10-CM

## 2013-11-05 DIAGNOSIS — Z87898 Personal history of other specified conditions: Secondary | ICD-10-CM

## 2013-11-05 DIAGNOSIS — G8929 Other chronic pain: Secondary | ICD-10-CM | POA: Diagnosis present

## 2013-11-05 DIAGNOSIS — Z91148 Patient's other noncompliance with medication regimen for other reason: Secondary | ICD-10-CM

## 2013-11-05 DIAGNOSIS — E785 Hyperlipidemia, unspecified: Secondary | ICD-10-CM | POA: Diagnosis present

## 2013-11-05 DIAGNOSIS — N390 Urinary tract infection, site not specified: Secondary | ICD-10-CM

## 2013-11-05 DIAGNOSIS — I5043 Acute on chronic combined systolic (congestive) and diastolic (congestive) heart failure: Secondary | ICD-10-CM

## 2013-11-05 DIAGNOSIS — J441 Chronic obstructive pulmonary disease with (acute) exacerbation: Principal | ICD-10-CM

## 2013-11-05 DIAGNOSIS — Z8546 Personal history of malignant neoplasm of prostate: Secondary | ICD-10-CM

## 2013-11-05 DIAGNOSIS — Z951 Presence of aortocoronary bypass graft: Secondary | ICD-10-CM

## 2013-11-05 DIAGNOSIS — M79609 Pain in unspecified limb: Secondary | ICD-10-CM | POA: Diagnosis present

## 2013-11-05 DIAGNOSIS — J9620 Acute and chronic respiratory failure, unspecified whether with hypoxia or hypercapnia: Secondary | ICD-10-CM

## 2013-11-05 DIAGNOSIS — I739 Peripheral vascular disease, unspecified: Secondary | ICD-10-CM | POA: Diagnosis present

## 2013-11-05 DIAGNOSIS — Z9114 Patient's other noncompliance with medication regimen: Secondary | ICD-10-CM

## 2013-11-05 DIAGNOSIS — I2510 Atherosclerotic heart disease of native coronary artery without angina pectoris: Secondary | ICD-10-CM

## 2013-11-05 DIAGNOSIS — Z85528 Personal history of other malignant neoplasm of kidney: Secondary | ICD-10-CM

## 2013-11-05 DIAGNOSIS — A419 Sepsis, unspecified organism: Secondary | ICD-10-CM

## 2013-11-05 DIAGNOSIS — Z7982 Long term (current) use of aspirin: Secondary | ICD-10-CM

## 2013-11-05 DIAGNOSIS — Z01811 Encounter for preprocedural respiratory examination: Secondary | ICD-10-CM

## 2013-11-05 DIAGNOSIS — F4322 Adjustment disorder with anxiety: Secondary | ICD-10-CM

## 2013-11-05 DIAGNOSIS — I10 Essential (primary) hypertension: Secondary | ICD-10-CM

## 2013-11-05 DIAGNOSIS — J449 Chronic obstructive pulmonary disease, unspecified: Secondary | ICD-10-CM

## 2013-11-05 DIAGNOSIS — C61 Malignant neoplasm of prostate: Secondary | ICD-10-CM

## 2013-11-05 DIAGNOSIS — Z885 Allergy status to narcotic agent status: Secondary | ICD-10-CM

## 2013-11-05 DIAGNOSIS — Z9981 Dependence on supplemental oxygen: Secondary | ICD-10-CM

## 2013-11-05 DIAGNOSIS — I209 Angina pectoris, unspecified: Secondary | ICD-10-CM

## 2013-11-05 DIAGNOSIS — I255 Ischemic cardiomyopathy: Secondary | ICD-10-CM

## 2013-11-05 DIAGNOSIS — G4733 Obstructive sleep apnea (adult) (pediatric): Secondary | ICD-10-CM

## 2013-11-05 DIAGNOSIS — R7309 Other abnormal glucose: Secondary | ICD-10-CM

## 2013-11-05 DIAGNOSIS — I2589 Other forms of chronic ischemic heart disease: Secondary | ICD-10-CM | POA: Diagnosis present

## 2013-11-05 DIAGNOSIS — F17200 Nicotine dependence, unspecified, uncomplicated: Secondary | ICD-10-CM

## 2013-11-05 DIAGNOSIS — Z8249 Family history of ischemic heart disease and other diseases of the circulatory system: Secondary | ICD-10-CM

## 2013-11-05 DIAGNOSIS — N181 Chronic kidney disease, stage 1: Secondary | ICD-10-CM

## 2013-11-05 DIAGNOSIS — Z87891 Personal history of nicotine dependence: Secondary | ICD-10-CM

## 2013-11-05 LAB — CBC
HCT: 42.8 % (ref 39.0–52.0)
Hemoglobin: 14.5 g/dL (ref 13.0–17.0)
MCH: 30.8 pg (ref 26.0–34.0)
MCHC: 33.9 g/dL (ref 30.0–36.0)
MCV: 90.9 fL (ref 78.0–100.0)
PLATELETS: 146 10*3/uL — AB (ref 150–400)
RBC: 4.71 MIL/uL (ref 4.22–5.81)
RDW: 14.5 % (ref 11.5–15.5)
WBC: 11.2 10*3/uL — AB (ref 4.0–10.5)

## 2013-11-05 LAB — BASIC METABOLIC PANEL
BUN: 33 mg/dL — ABNORMAL HIGH (ref 6–23)
CALCIUM: 9.6 mg/dL (ref 8.4–10.5)
CHLORIDE: 100 meq/L (ref 96–112)
CO2: 24 meq/L (ref 19–32)
Creatinine, Ser: 1.14 mg/dL (ref 0.50–1.35)
GFR calc Af Amer: 74 mL/min — ABNORMAL LOW (ref >90)
GFR calc non Af Amer: 64 mL/min — ABNORMAL LOW (ref >90)
Glucose, Bld: 107 mg/dL — ABNORMAL HIGH (ref 70–99)
Potassium: 3.8 mEq/L (ref 3.7–5.3)
SODIUM: 139 meq/L (ref 137–147)

## 2013-11-05 LAB — I-STAT TROPONIN, ED: TROPONIN I, POC: 0.05 ng/mL (ref 0.00–0.08)

## 2013-11-05 LAB — TROPONIN I: Troponin I: <0.3 ng/mL (ref <0.30)

## 2013-11-05 LAB — PRO B NATRIURETIC PEPTIDE
PRO B NATRI PEPTIDE: 2630 pg/mL — AB (ref 0–125)
Pro B Natriuretic peptide (BNP): 2892 pg/mL — ABNORMAL HIGH (ref 0–125)

## 2013-11-05 MED ORDER — NITROGLYCERIN 0.4 MG SL SUBL
0.4000 mg | SUBLINGUAL_TABLET | SUBLINGUAL | Status: DC | PRN
  Administered 2013-11-05: 0.4 mg via SUBLINGUAL
  Filled 2013-11-05: qty 1

## 2013-11-05 MED ORDER — MOMETASONE FURO-FORMOTEROL FUM 100-5 MCG/ACT IN AERO
2.0000 | INHALATION_SPRAY | Freq: Two times a day (BID) | RESPIRATORY_TRACT | Status: DC
  Administered 2013-11-05 – 2013-11-09 (×8): 2 via RESPIRATORY_TRACT
  Filled 2013-11-05: qty 8.8

## 2013-11-05 MED ORDER — ASPIRIN 81 MG PO CHEW
81.0000 mg | CHEWABLE_TABLET | Freq: Every day | ORAL | Status: DC
  Administered 2013-11-06 – 2013-11-08 (×3): 81 mg via ORAL
  Filled 2013-11-05 (×4): qty 1

## 2013-11-05 MED ORDER — IRBESARTAN 75 MG PO TABS
75.0000 mg | ORAL_TABLET | Freq: Every day | ORAL | Status: DC
  Administered 2013-11-06 – 2013-11-07 (×2): 75 mg via ORAL
  Filled 2013-11-05 (×2): qty 1

## 2013-11-05 MED ORDER — APIXABAN 5 MG PO TABS
5.0000 mg | ORAL_TABLET | Freq: Two times a day (BID) | ORAL | Status: DC
  Administered 2013-11-05 – 2013-11-08 (×7): 5 mg via ORAL
  Filled 2013-11-05 (×9): qty 1

## 2013-11-05 MED ORDER — NICOTINE 14 MG/24HR TD PT24
14.0000 mg | MEDICATED_PATCH | Freq: Every day | TRANSDERMAL | Status: DC
  Administered 2013-11-05 – 2013-11-08 (×4): 14 mg via TRANSDERMAL
  Filled 2013-11-05 (×5): qty 1

## 2013-11-05 MED ORDER — ASPIRIN 81 MG PO CHEW: 324.0000 mg | CHEWABLE_TABLET | Freq: Every day | ORAL | Status: DC

## 2013-11-05 MED ORDER — ONDANSETRON HCL 4 MG/2ML IJ SOLN
4.0000 mg | Freq: Once | INTRAMUSCULAR | Status: AC
  Administered 2013-11-05: 4 mg via INTRAVENOUS
  Filled 2013-11-05: qty 2

## 2013-11-05 MED ORDER — IPRATROPIUM-ALBUTEROL 0.5-2.5 (3) MG/3ML IN SOLN
3.0000 mL | Freq: Once | RESPIRATORY_TRACT | Status: AC
  Administered 2013-11-05: 3 mL via RESPIRATORY_TRACT
  Filled 2013-11-05: qty 3

## 2013-11-05 MED ORDER — IPRATROPIUM-ALBUTEROL 0.5-2.5 (3) MG/3ML IN SOLN
3.0000 mL | Freq: Four times a day (QID) | RESPIRATORY_TRACT | Status: DC
  Administered 2013-11-05 – 2013-11-09 (×15): 3 mL via RESPIRATORY_TRACT
  Filled 2013-11-05 (×16): qty 3

## 2013-11-05 MED ORDER — METHYLPREDNISOLONE SODIUM SUCC 125 MG IJ SOLR
60.0000 mg | Freq: Four times a day (QID) | INTRAMUSCULAR | Status: DC
  Administered 2013-11-05 – 2013-11-06 (×5): 60 mg via INTRAVENOUS
  Filled 2013-11-05 (×7): qty 0.96

## 2013-11-05 MED ORDER — ALPRAZOLAM 1 MG PO TABS
1.0000 mg | ORAL_TABLET | Freq: Three times a day (TID) | ORAL | Status: DC | PRN
  Administered 2013-11-05 – 2013-11-09 (×10): 1 mg via ORAL
  Filled 2013-11-05 (×10): qty 1

## 2013-11-05 MED ORDER — ASPIRIN 81 MG PO CHEW
324.0000 mg | CHEWABLE_TABLET | Freq: Once | ORAL | Status: DC
  Filled 2013-11-05: qty 4

## 2013-11-05 MED ORDER — DOXAZOSIN MESYLATE 8 MG PO TABS
8.0000 mg | ORAL_TABLET | Freq: Every day | ORAL | Status: DC
  Administered 2013-11-05 – 2013-11-08 (×4): 8 mg via ORAL
  Filled 2013-11-05 (×6): qty 1

## 2013-11-05 MED ORDER — FUROSEMIDE 40 MG PO TABS
40.0000 mg | ORAL_TABLET | Freq: Every evening | ORAL | Status: DC
  Filled 2013-11-05: qty 1

## 2013-11-05 MED ORDER — ACETAMINOPHEN 325 MG PO TABS: 650.0000 mg | ORAL_TABLET | Freq: Four times a day (QID) | ORAL | Status: DC | PRN

## 2013-11-05 MED ORDER — ALBUTEROL SULFATE (2.5 MG/3ML) 0.083% IN NEBU: 2.5000 mg | INHALATION_SOLUTION | RESPIRATORY_TRACT | Status: DC | PRN

## 2013-11-05 MED ORDER — LEVOFLOXACIN 500 MG PO TABS
500.0000 mg | ORAL_TABLET | Freq: Every day | ORAL | Status: DC
  Administered 2013-11-05 – 2013-11-08 (×4): 500 mg via ORAL
  Filled 2013-11-05 (×5): qty 1

## 2013-11-05 MED ORDER — HYDROCODONE-ACETAMINOPHEN 5-325 MG PO TABS
1.0000 | ORAL_TABLET | ORAL | Status: DC | PRN
  Administered 2013-11-05 – 2013-11-07 (×8): 2 via ORAL
  Filled 2013-11-05 (×8): qty 2

## 2013-11-05 MED ORDER — ENOXAPARIN SODIUM 40 MG/0.4ML ~~LOC~~ SOLN
40.0000 mg | SUBCUTANEOUS | Status: DC
  Filled 2013-11-05: qty 0.4

## 2013-11-05 MED ORDER — ONDANSETRON HCL 4 MG/2ML IJ SOLN: 4.0000 mg | Freq: Four times a day (QID) | INTRAMUSCULAR | Status: DC | PRN

## 2013-11-05 NOTE — ED Provider Notes (Signed)
CSN: [id number]     Arrival date & time 11/05/13  1139 History   First MD Initiated Contact with Patient 11/05/13 1146     Chief Complaint  Patient presents with  . Shortness of Breath  . Chest Pain     (Consider location/radiation/quality/duration/timing/severity/associated sxs/prior Treatment) HPI 68 year old male presents with recurrent shortness of breath, chest tightness, and chest pain. He states that his discharge from the hospital 3 days ago for pneumonia. They had recommended he stay until today but he really wanted to leave. He has finished his antibiotics for his pneumonia. He states 2 days ago started having a recurrent cough and then yesterday noticed the shortness of breath and pain. Today was significantly worse and his home health nurse gave him albuterol treatment and called EMS. The patient feels like he needs to come back in the hospital as he felt like he left early. EMS also gave him albuterol treatment he feels improved but not good enough. Denies any fevers or chills. This chest pain is left-sided he states it is sharp. He's had multiple heart attacks in the past but states they felt different than this pain. 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 His leg has not become more swollen or more painful recently. No other leg swelling.  Past Medical History  Diagnosis Date  . CAD (coronary artery disease)     a. s/p CABG x 5;  b. 04/2012 Cath: patent LIMA->LAD and VG->OM3, all other grafts occluded.  . Hypertension   . Anxiety   . History of kidney cancer   . Adjustment disorder with anxiety   . Hyperlipidemia   . BPH (benign prostatic hypertrophy)   . COPD (chronic obstructive pulmonary disease)     a. on home O2.  . Arthritis   . Ischemic cardiomyopathy     a. 06/2013 Echo: EF 30-35%, no reg wma's, Gr2 DD, triv AI, mod dil LA, nl RV fxn.  . Chronic systolic CHF (congestive heart failure)     a.  06/2013 Echo: EF 30-35%.  . ADENOCARCINOMA, PROSTATE dx'd 2012  . Renal cancer dx'd 1997    lt nephrectomy   Past Surgical History  Procedure Laterality Date  . Coronary artery bypass graft      x 5  . Cardiac catheterization    . Nephrectomy      left nephrectomy for ca  . Posterior cervical laminectomy      x 8   limited ROM  and can't lie flat  . Heart stents      x 5  . Robot assisted laparoscopic radical prostatectomy      for prostate cancer  . Cystoscopy with litholapaxy  05/01/2012    Procedure: CYSTOSCOPY WITH LITHOLAPAXY;  Surgeon: Dutch Gray, MD;  Location: [facility];  Service: Urology;  Laterality: N/A;  . Prostate cancer     Family History  Problem Relation Age of Onset  . Diabetes Mother   . Coronary artery disease    . Heart disease Mother   . Heart disease Father   . Heart disease Brother   . Diabetes Father    History  Substance Use Topics  . Smoking status: Former Smoker -- 0.30 packs/day for 54 years    Types: Cigarettes    Quit date: 03/29/2013  . Smokeless tobacco: Former Systems developer    Quit date: 04/19/2012     Comment: Using e-cig now  . Alcohol Use:  No    Review of Systems  Constitutional: Negative for fever.  Respiratory: Positive for cough, chest tightness and shortness of breath.   Cardiovascular: Positive for chest pain and leg swelling.  Gastrointestinal: Positive for abdominal distention (has been swelling). Negative for vomiting and abdominal pain.  Neurological: Negative for weakness.  All other systems reviewed and are negative.     Allergies  Ativan; Lisinopril; Morphine and related; and Ibuprofen  Home Medications   Prior to Admission medications   Medication Sig Start Date End Date Taking? Authorizing Provider  albuterol (PROVENTIL HFA;VENTOLIN HFA) 108 (90 BASE) MCG/ACT inhaler Inhale 2 puffs into the lungs every 6 (six) hours as needed for wheezing or shortness of breath. Shortness of breath 11/02/13  Yes Domenic Polite, MD   albuterol (PROVENTIL) (2.5 MG/3ML) 0.083% nebulizer solution Take 2.5 mg by nebulization 4 (four) times daily. 04/06/13  Yes Erick Colace, NP  ALPRAZolam Duanne Moron) 1 MG tablet Take 1 mg by mouth 3 (three) times daily as needed for anxiety.   Yes Historical Provider, MD  aspirin EC 81 MG tablet Take 81 mg by mouth daily.   Yes Historical Provider, MD  doxazosin (CARDURA) 8 MG tablet Take 8 mg by mouth at bedtime.    Yes Historical Provider, MD  furosemide (LASIX) 40 MG tablet Take 20 mg by mouth every evening.   Yes Historical Provider, MD  HYDROcodone-acetaminophen (NORCO/VICODIN) 5-325 MG per tablet Take 1-2 tablets by mouth every 4 (four) hours as needed (pain). 10/16/13  Yes Kathalene Frames, MD  ipratropium (ATROVENT) 0.02 % nebulizer solution Take 0.5 mg by nebulization 4 (four) times daily.   Yes Historical Provider, MD  Ipratropium-Albuterol (COMBIVENT RESPIMAT) 20-100 MCG/ACT AERS respimat Inhale 1 puff into the lungs every 6 (six) hours as needed for wheezing or shortness of breath. 11/02/13  Yes Domenic Polite, MD  levofloxacin (LEVAQUIN) 500 MG tablet Take 1 tablet (500 mg total) by mouth daily. For 2 days 11/02/13  Yes Domenic Polite, MD  mometasone-formoterol [facility]) 100-5 MCG/ACT AERO Inhale 2 puffs into the lungs 2 (two) times daily. 11/02/13  Yes Domenic Polite, MD  predniSONE (DELTASONE) 10 MG tablet Take 40mg  for 1 day then 30mg  for 1day then 20mg  for 1day then 10mg  for 1day then STOP 11/02/13  Yes Domenic Polite, MD  valsartan (DIOVAN) 40 MG tablet Take 1 tablet (40 mg total) by mouth daily. 11/02/13  Yes Domenic Polite, MD  NITROSTAT 0.3 MG SL tablet Place 0.3 mg under the tongue every 5 (five) minutes as needed for chest pain.  06/15/13   Historical Provider, MD   BP 143/65  Pulse 112  Temp(Src) 98.9 F (37.2 C) (Oral)  Resp 20  SpO2 95% Physical Exam  Nursing note and vitals reviewed. Constitutional: He is oriented to person, place, and time. He appears well-developed and  well-nourished. No distress.  HENT:  Head: Normocephalic and atraumatic.  Right Ear: External ear normal.  Left Ear: External ear normal.  Nose: Nose normal.  Eyes: Right eye exhibits no discharge. Left eye exhibits no discharge.  Neck: Neck supple.  Cardiovascular: Normal rate, regular rhythm, normal heart sounds and intact distal pulses.   Pulmonary/Chest: Effort normal and breath sounds normal. He exhibits no tenderness.  Abdominal: Soft. He exhibits no distension. There is no tenderness.  Musculoskeletal: He exhibits edema (RLE pitting edema - mild).  Neurological: He is alert and oriented to person, place, and time.  Skin: Skin is warm and dry.    ED Course  Procedures (including critical care time) Labs Review Labs Reviewed  CBC - Abnormal; Notable for the following:    WBC 11.2 (*)    Platelets 146 (*)    All other components within normal limits  BASIC METABOLIC PANEL - Abnormal; Notable for the following:    Glucose, Bld 107 (*)    BUN 33 (*)    GFR calc non Af Amer 64 (*)    GFR calc Af Amer 74 (*)    All other components within normal limits  PRO B NATRIURETIC PEPTIDE - Abnormal; Notable for the following:    Pro B Natriuretic peptide (BNP) 2630.0 (*)    All other components within normal limits  PRO B NATRIURETIC PEPTIDE - Abnormal; Notable for the following:    Pro B Natriuretic peptide (BNP) 2892.0 (*)    All other components within normal limits  TROPONIN I  TROPONIN I  CBC  BASIC METABOLIC PANEL  Randolm Idol, ED    Imaging Review Dg Chest Port 1 View  11/05/2013   CLINICAL DATA:  Shortness of breath and chest pain  EXAM: PORTABLE CHEST - 1 VIEW  COMPARISON:  October 28, 2013  FINDINGS: There is no edema or consolidation. Heart is borderline enlarged with normal pulmonary vascularity. No adenopathy. No pneumothorax. Patient is status post coronary artery bypass grafting.  IMPRESSION: Heart borderline enlarged.  No edema or consolidation.   Electronically  Signed   By: Lowella Grip M.D.   On: 11/05/2013 12:25     EKG Interpretation   Date/Time:  Monday November 05 2013 11:45:11 EDT Ventricular Rate:  104 PR Interval:    QRS Duration: 98 QT Interval:  379 QTC Calculation: 498 R Axis:   76 Text Interpretation:  Atrial fibrillation Ventricular premature complex  Anteroseptal infarct, old Borderline ST depression, inferior leads No  significant change since last tracing Confirmed by Aubreyana Saltz  MD, Vaibhav Fogleman  ([id number]) on 11/05/2013 11:53:57 AM      MDM   Final diagnoses:  COPD exacerbation  A-fib  Acute-on-chronic respiratory failure  Chronic systolic CHF (congestive heart failure)  Acute exacerbation of chronic obstructive pulmonary disease (COPD)  Adjustment disorder with anxiety  Borderline diabetes    Patient is well appearing but feels distressed. No new hypoxia. No new PNA. BNP elevated more, and he is complaining of more distress. He feels unsafe at home with his dyspnea, will admit to hospitalist for further workup and treatment.     Ephraim Hamburger, MD 11/05/13 2125

## 2013-11-05 NOTE — H&P (Signed)
[facility] History and Physical  [name] MCLEAN ZYS:[id number] DOB: 1945-11-14 DOA: 11/05/2013  Referring physician: EDP PCP: Salena Saner., MD  Cards Dr.Nishan  Chief Complaint: chest tightness and dyspnea  HPI: Lucas [name] is a 67 y.o. male with PMH of CAD, CABG, ICM EF of 30-35%, COPD/chronic respiratory failure on 2L Home O2, peripheral vascular disease, severe anxiety was just discharged from [facility] 4 days ago after treatment for COPD exacerbation and possible pneumonia, he was sent home on Levaquin and a prednisone taper with advice for quit followup with PCP and Dr. Chase Caller. He reportedly did well initially after discharge home but then on Sunday started noticing chest tightness, wheezing, increased dyspnea on exertion, his oxygen sats were 89% on 2 L this morning and hence called EMS. In the ER and noted to have stable vital signs including O2 sats BNP is slightly elevated compared to baseline, However he still complains of chest tightness and this too afraid to go back home because he reportedly lives alone.     Review of Systems:  Constitutional:  No weight loss, night sweats, Fevers, chills, fatigue.  HEENT:  No headaches, Difficulty swallowing,Tooth/dental problems,Sore throat,  No sneezing, itching, ear ache, nasal congestion, post nasal drip,  Cardio-vascular:  No chest pain, Orthopnea, PND, swelling in lower extremities, anasarca, dizziness, palpitations  GI:  No heartburn, indigestion, abdominal pain, nausea, vomiting, diarrhea, change in bowel habits, loss of appetite  Resp:  No shortness of breath with exertion or at rest. No excess mucus, no productive cough, No non-productive cough, No coughing up of blood.No change in color of mucus.No wheezing.No chest wall deformity  Skin:  no rash or lesions.  GU:  no dysuria, change in color of urine, no urgency or frequency. No flank pain.  Musculoskeletal:  No joint pain or swelling. No  decreased range of motion. No back pain.  Psych:  No change in mood or affect. No depression or anxiety. No memory loss.   Past Medical History  Diagnosis Date  . CAD (coronary artery disease)     a. s/p CABG x 5;  b. 04/2012 Cath: patent LIMA->LAD and VG->OM3, all other grafts occluded.  . Hypertension   . Anxiety   . History of kidney cancer   . Adjustment disorder with anxiety   . Hyperlipidemia   . BPH (benign prostatic hypertrophy)   . COPD (chronic obstructive pulmonary disease)     a. on home O2.  . Arthritis   . Ischemic cardiomyopathy     a. 06/2013 Echo: EF 30-35%, no reg wma's, Gr2 DD, triv AI, mod dil LA, nl RV fxn.  . Chronic systolic CHF (congestive heart failure)     a. 06/2013 Echo: EF 30-35%.  . ADENOCARCINOMA, PROSTATE dx'd 2012  . Renal cancer dx'd 1997    lt nephrectomy   Past Surgical History  Procedure Laterality Date  . Coronary artery bypass graft      x 5  . Cardiac catheterization    . Nephrectomy      left nephrectomy for ca  . Posterior cervical laminectomy      x 8   limited ROM  and can't lie flat  . Heart stents      x 5  . Robot assisted laparoscopic radical prostatectomy      for prostate cancer  . Cystoscopy with litholapaxy  05/01/2012    Procedure: CYSTOSCOPY WITH LITHOLAPAXY;  Surgeon: Dutch Gray, MD;  Location: [facility];  Service: Urology;  Laterality: N/A;  . Prostate cancer     Social History:  reports that he quit smoking about 7 months ago. His smoking use included Cigarettes. He has a 16.2 pack-year smoking history. He quit smokeless tobacco use about 18 months ago. He reports that he does not drink alcohol or use illicit drugs.  Allergies  Allergen Reactions  . Ativan [Lorazepam] Other (See Comments)    Increases agitation, tolerates xanax  . Lisinopril Cough  . Morphine And Related     Hallucinations, too sedated when given with ativan  . Ibuprofen Hives, Nausea And Vomiting and Rash    Tolerates baby aspirin per patient.       Family History  Problem Relation Age of Onset  . Diabetes Mother   . Coronary artery disease    . Heart disease Mother   . Heart disease Father   . Heart disease Brother   . Diabetes Father      Prior to Admission medications   Medication Sig Start Date End Date Taking? Authorizing Provider  albuterol (PROVENTIL HFA;VENTOLIN HFA) 108 (90 BASE) MCG/ACT inhaler Inhale 2 puffs into the lungs every 6 (six) hours as needed for wheezing or shortness of breath. Shortness of breath 11/02/13  Yes Domenic Polite, MD  albuterol (PROVENTIL) (2.5 MG/3ML) 0.083% nebulizer solution Take 2.5 mg by nebulization 4 (four) times daily. 04/06/13  Yes Erick Colace, NP  ALPRAZolam Duanne Moron) 1 MG tablet Take 1 mg by mouth 3 (three) times daily as needed for anxiety.   Yes Historical Provider, MD  aspirin EC 81 MG tablet Take 81 mg by mouth daily.   Yes Historical Provider, MD  doxazosin (CARDURA) 8 MG tablet Take 8 mg by mouth at bedtime.    Yes Historical Provider, MD  furosemide (LASIX) 40 MG tablet Take 20 mg by mouth every evening.   Yes Historical Provider, MD  HYDROcodone-acetaminophen (NORCO/VICODIN) 5-325 MG per tablet Take 1-2 tablets by mouth every 4 (four) hours as needed (pain). 10/16/13  Yes Kathalene Frames, MD  ipratropium (ATROVENT) 0.02 % nebulizer solution Take 0.5 mg by nebulization 4 (four) times daily.   Yes Historical Provider, MD  Ipratropium-Albuterol (COMBIVENT RESPIMAT) 20-100 MCG/ACT AERS respimat Inhale 1 puff into the lungs every 6 (six) hours as needed for wheezing or shortness of breath. 11/02/13  Yes Domenic Polite, MD  levofloxacin (LEVAQUIN) 500 MG tablet Take 1 tablet (500 mg total) by mouth daily. For 2 days 11/02/13  Yes Domenic Polite, MD  mometasone-formoterol [facility]) 100-5 MCG/ACT AERO Inhale 2 puffs into the lungs 2 (two) times daily. 11/02/13  Yes Domenic Polite, MD  predniSONE (DELTASONE) 10 MG tablet Take 40mg  for 1 day then 30mg  for 1day then 20mg  for 1day then 10mg  for 1day  then STOP 11/02/13  Yes Domenic Polite, MD  valsartan (DIOVAN) 40 MG tablet Take 1 tablet (40 mg total) by mouth daily. 11/02/13  Yes Domenic Polite, MD  NITROSTAT 0.3 MG SL tablet Place 0.3 mg under the tongue every 5 (five) minutes as needed for chest pain.  06/15/13   Historical Provider, MD   Physical Exam: Filed Vitals:   11/05/13 1324  BP: 113/59  Pulse: 94  Temp:   Resp: 18    BP 113/59  Pulse 94  Temp(Src) 98.9 F (37.2 C) (Oral)  Resp 18  SpO2 92%  General:  Appears calm and comfortable, moderately obese, no distress  Eyes: PERRL, normal lids, irises & conjunctiva ENT: grossly normal hearing, lips & tongue Neck: no  LAD, masses or thyromegaly, neck no JVD noted  Cardiovascular:  irregular rate and rhythm , no m/r/g. No LE edema. Respiratory:  poorr air movement bilaterally  Abdomen: soft,  obese, ntnd, Bs present  Skin: no rash or induration seen on limited exam Musculoskeletal: grossly normal tone BUE/BLE Psychiatric: grossly normal mood and affect, speech fluent and appropriate Neurologic: grossly non-focal.          Labs on Admission:  Basic Metabolic Panel:  Recent Labs Lab 10/30/13 0335 11/01/13 0408 11/02/13 0350 11/05/13 1150  NA 135* 137 136* 139  K 4.7 4.8 4.8 3.8  CL 98 101 99 100  CO2 22 24 28 24   GLUCOSE 149* 199* 153* 107*  BUN 55* 47* 44* 33*  CREATININE 1.80* 1.42* 1.46* 1.14  CALCIUM 9.4 9.7 9.2 9.6   Liver Function Tests:  Recent Labs Lab 10/30/13 0335  AST 30  ALT 24  ALKPHOS 54  BILITOT 0.3  PROT 7.1  ALBUMIN 3.5   No results found for this basename: LIPASE, AMYLASE,  in the last 168 hours No results found for this basename: AMMONIA,  in the last 168 hours CBC:  Recent Labs Lab 10/30/13 0335 11/01/13 0408 11/02/13 0350 11/05/13 1150  WBC 17.7* 12.4* 13.5* 11.2*  HGB 13.2 12.9* 12.8* 14.5  HCT 39.9 39.9 39.4 42.8  MCV 91.5 94.1 92.3 90.9  PLT 171 164 176 146*   Cardiac Enzymes: No results found for this basename:  CKTOTAL, CKMB, CKMBINDEX, TROPONINI,  in the last 168 hours  BNP (last 3 results)  Recent Labs  10/16/13 1120 10/28/13 1910 11/05/13 1150  PROBNP 1547.0* 1388.0* 2630.0*   CBG: No results found for this basename: GLUCAP,  in the last 168 hours  Radiological Exams on Admission: Dg Chest Port 1 View  11/05/2013   CLINICAL DATA:  Shortness of breath and chest pain  EXAM: PORTABLE CHEST - 1 VIEW  COMPARISON:  October 28, 2013  FINDINGS: There is no edema or consolidation. Heart is borderline enlarged with normal pulmonary vascularity. No adenopathy. No pneumothorax. Patient is status post coronary artery bypass grafting.  IMPRESSION: Heart borderline enlarged.  No edema or consolidation.   Electronically Signed   By: Lowella Grip M.D.   On: 11/05/2013 12:25    EKG: Independently reviewed. NSR, RBBB  Assessment/Plan   1. COPD exacerbation  - Clinically mild - Admit to telemetry, IV Solu-Medrol, continue by mouth Levaquin, albuterol and Atrovent nebs -I suspect his anxiety is a major contributor this time -will also cycle cardiac enzymes  2. history of CAD/ischemic cardiomyopathy, EF of 35% -Clinically euvolemic but BNP slightly higher than baseline, will double home dose of Lasix for now -Strict I/O.s, daily weights  3. Atrial fibrillation -new, rate was slightly up due to 1 -now improved -CHADsvas score is 4, will start on Apixaban, I discussed this with his primary cardiologist Dr.Nishan -CM consult for cost/affordability  4.  CKD (chronic kidney disease) stage2 -creatinine slightly lower than baseline, may be due to the fact that he's slightly volume overloaded -monitor with diuresis -continue ARB  5. Adjustment disorder with anxiety -continue xanax PRN  6. Severe PAD -fu with Dr.Cooper/Vascular  Code Status: Full Code Family Communication: none at bedside Disposition Plan: inpatient  Time spent:30min  [facility] Pager [phone]

## 2013-11-05 NOTE — ED Notes (Addendum)
Hx of cardiac cath and stents placed. Per ems pt seen in ED on 4/19 for chest pain SOB, was admitted to hospital. Left AMA on Friday. Reports chest pain x1 week, and SOB markedly increased today. Pt used his inhaler, home health arrived and called 911 this morning. At present pt not able to speak in full sentences. Left sided chest pain 5/10. Pt requesting [place]. Pt has cabg scheduled for June.   324 aspirin given already

## 2013-11-05 NOTE — ED Notes (Signed)
Attempted to call report to floor, they were unable to take it, they will call back

## 2013-11-05 NOTE — Telephone Encounter (Signed)
Called, spoke with April with [facility].  Pt was d/c'd on Friday, April 24 from [facility] on levaquin. Per April, pt advised that they wanted him to stay until today, but he talked them into letting him leave early.  April reports pt called [facility] this am with the c/o o2 sats being 89-90 on o2, chest heaviness, nonprod cough, SOB at rest, and weakness.  April reports when she arrived, pt's o2 sat was 95% on 2 lpm continuous, he sounded tight, coarse, and had wheezes throughout.  She called EMS to transport pt to [facility] ED as she and pt both felt like he needed to go back to the ED.  While awaiting for EMS, she had pt do a breathing tx.  Reports pt sounded better and wasn't as labored after this.  She is calling as FYI that pt is going to [facility] ED via EMS.  Will sign off and route to MR as FYI.

## 2013-11-05 NOTE — Progress Notes (Signed)
ANTICOAGULATION CONSULT NOTE - Initial Consult  Pharmacy Consult for apixaban Indication: atrial fibrillation  Allergies  Allergen Reactions  . Ativan [Lorazepam] Other (See Comments)    Increases agitation, tolerates xanax  . Lisinopril Cough  . Morphine And Related     Hallucinations, too sedated when given with ativan  . Ibuprofen Hives, Nausea And Vomiting and Rash    Tolerates baby aspirin per patient.      Patient Measurements: Height: 5\' 8"  (172.7 cm) Weight: 221 lb 1.6 oz (100.29 kg) IBW/kg (Calculated) : 68.4 Heparin Dosing Weight:   Vital Signs: Temp: 98.9 F (37.2 C) (04/27 1145) Temp src: Oral (04/27 1145) BP: 149/67 mmHg (04/27 1420) Pulse Rate: 94 (04/27 1420)  Labs:  Recent Labs  11/05/13 1150  HGB 14.5  HCT 42.8  PLT 146*  CREATININE 1.14    Estimated Creatinine Clearance: 71.2 ml/min (by C-G formula based on Cr of 1.14).   Medical History: Past Medical History  Diagnosis Date  . CAD (coronary artery disease)     a. s/p CABG x 5;  b. 04/2012 Cath: patent LIMA->LAD and VG->OM3, all other grafts occluded.  . Hypertension   . Anxiety   . History of kidney cancer   . Adjustment disorder with anxiety   . Hyperlipidemia   . BPH (benign prostatic hypertrophy)   . COPD (chronic obstructive pulmonary disease)     a. on home O2.  . Arthritis   . Ischemic cardiomyopathy     a. 06/2013 Echo: EF 30-35%, no reg wma's, Gr2 DD, triv AI, mod dil LA, nl RV fxn.  . Chronic systolic CHF (congestive heart failure)     a. 06/2013 Echo: EF 30-35%.  . ADENOCARCINOMA, PROSTATE dx'd 2012  . Renal cancer dx'd 1997    lt nephrectomy    Assessment: 23 YOM admitted with  Chest tightness and dyspnea.  Recently admitted at [facility] with AECOPD.  Orders to start apixaban fro afib (CHA2DS2-VASc = 4).    Wt = 100kg  SCr =  1.14 today (h/o CRI) - this is likely below baseline  CBC: hgb WNL, pltc = 146  Goal of Therapy:  Monitor platelets by anticoagulation  protocol: Yes Dose appropriately for indication and patient-specific parameters   Plan:   Apixaban 5mg  BID (does not meet 2/3 criteria for dose reduction, even if you factor in possibility of SCr > 1.4)  Monitor for bleeding  D/C enoxaparin prophylaxis  Education  Doreene Eland, PharmD, BCPS.   Pager: [phone]  11/05/2013,2:45 PM

## 2013-11-06 DIAGNOSIS — I5043 Acute on chronic combined systolic (congestive) and diastolic (congestive) heart failure: Secondary | ICD-10-CM

## 2013-11-06 DIAGNOSIS — I509 Heart failure, unspecified: Secondary | ICD-10-CM

## 2013-11-06 LAB — BASIC METABOLIC PANEL
BUN: 34 mg/dL — ABNORMAL HIGH (ref 6–23)
CALCIUM: 8.9 mg/dL (ref 8.4–10.5)
CHLORIDE: 98 meq/L (ref 96–112)
CO2: 24 mEq/L (ref 19–32)
CREATININE: 1.27 mg/dL (ref 0.50–1.35)
GFR calc Af Amer: 65 mL/min — ABNORMAL LOW (ref >90)
GFR calc non Af Amer: 56 mL/min — ABNORMAL LOW (ref >90)
GLUCOSE: 192 mg/dL — AB (ref 70–99)
Potassium: 4.9 mEq/L (ref 3.7–5.3)
Sodium: 136 mEq/L — ABNORMAL LOW (ref 137–147)

## 2013-11-06 LAB — CBC
HCT: 40.8 % (ref 39.0–52.0)
Hemoglobin: 13.3 g/dL (ref 13.0–17.0)
MCH: 30.5 pg (ref 26.0–34.0)
MCHC: 32.6 g/dL (ref 30.0–36.0)
MCV: 93.6 fL (ref 78.0–100.0)
PLATELETS: 136 10*3/uL — AB (ref 150–400)
RBC: 4.36 MIL/uL (ref 4.22–5.81)
RDW: 14.6 % (ref 11.5–15.5)
WBC: 10.1 10*3/uL (ref 4.0–10.5)

## 2013-11-06 MED ORDER — METHYLPREDNISOLONE SODIUM SUCC 125 MG IJ SOLR
60.0000 mg | Freq: Three times a day (TID) | INTRAMUSCULAR | Status: DC
  Administered 2013-11-06 – 2013-11-08 (×6): 60 mg via INTRAVENOUS
  Filled 2013-11-06 (×8): qty 0.96

## 2013-11-06 MED ORDER — KETOROLAC TROMETHAMINE 30 MG/ML IJ SOLN
30.0000 mg | Freq: Four times a day (QID) | INTRAMUSCULAR | Status: DC | PRN
  Administered 2013-11-06 – 2013-11-07 (×3): 30 mg via INTRAVENOUS
  Filled 2013-11-06: qty 1
  Filled 2013-11-06 (×3): qty 2

## 2013-11-06 MED ORDER — FUROSEMIDE 10 MG/ML IJ SOLN
60.0000 mg | Freq: Two times a day (BID) | INTRAMUSCULAR | Status: DC
  Administered 2013-11-06 – 2013-11-07 (×3): 60 mg via INTRAVENOUS
  Filled 2013-11-06 (×4): qty 6

## 2013-11-06 NOTE — Discharge Instructions (Signed)
Information on my medicine - ELIQUIS (apixaban)  This medication education was reviewed with me or my healthcare representative as part of my discharge preparation.  The pharmacist that spoke with me during my hospital stay was:  Angela Adam, [facility]  Why was Eliquis prescribed for you? Eliquis was prescribed for you to reduce the risk of forming blood clots that can cause a stroke if you have a medical condition called atrial fibrillation (a type of irregular heartbeat) OR to reduce the risk of a blood clots forming after orthopedic surgery.  What do You need to know about Eliquis ? Take your Eliquis TWICE DAILY - one tablet in the morning and one tablet in the evening with or without food.  It would be best to take the doses about the same time each day.  If you have difficulty swallowing the tablet whole please discuss with your pharmacist how to take the medication safely.  Take Eliquis exactly as prescribed by your doctor and DO NOT stop taking Eliquis without talking to the doctor who prescribed the medication.  Stopping may increase your risk of developing a new clot or stroke.  Refill your prescription before you run out.  After discharge, you should have regular check-up appointments with your healthcare provider that is prescribing your Eliquis.  In the future your dose may need to be changed if your kidney function or weight changes by a significant amount or as you get older.  What do you do if you miss a dose? If you miss a dose, take it as soon as you remember on the same day and resume taking twice daily.  Do not take more than one dose of ELIQUIS at the same time.  Important Safety Information A possible side effect of Eliquis is bleeding. You should call your healthcare provider right away if you experience any of the following:   Bleeding from an injury or your nose that does not stop.   Unusual colored urine (red or dark brown) or unusual colored stools (red or  black).   Unusual bruising for unknown reasons.   A serious fall or if you hit your head (even if there is no bleeding).  Some medicines may interact with Eliquis and might increase your risk of bleeding or clotting while on Eliquis. To help avoid this, consult your healthcare provider or pharmacist prior to using any new prescription or non-prescription medications, including herbals, vitamins, non-steroidal anti-inflammatory drugs (NSAIDs) and supplements.  This website has more information on Eliquis (apixaban): [email or web address].

## 2013-11-06 NOTE — Progress Notes (Signed)
[facility] PROGRESS NOTE  Lucas [name] WUJ:[id number] DOB: 25-Jul-1945 DOA: 11/05/2013 PCP: Salena Saner., MD  Assessment/Plan: #1 acute on chronic respiratory failure Likely multifactorial secondary to acute CHF exacerbation in the setting of possible COPD exacerbation. Patient is noted to be volume overloaded on exam with 2+ lower extremity edema and some bibasilar crackles. Will place patient on Lasix 60 mg IV every 12 hours. Strict is and os. Daily weights. Continue aspirin, Avapro. Will taper down to IV steroids. Continue oral Levaquin. Continue nebulizers.  #2 acute on chronic systolic and diastolic CHF exacerbation Cardiac enzymes negative x2. Pro BNP elevated at 2892 on admission. Strict is and os. Daily weights. Place on Lasix 60 mg IV every 12 hours. Continue Avapro and aspirin. Follow.  #3 acute COPD exacerbation Continue empiric oxygen, taper IV steroids, oral Levaquin, Mucinex, nebulizer treatments.  #4 atrial fibrillation Noted on EKG per admitting physician. Currently rate controlled. Patient has been started on apixaban for anticoagulation which admitting physician discussed with patient's cardiologist. Outpatient followup.  #5 chronic kidney disease stage II Stable. Monitor with diuresis.  #6 adjustment disorder with anxiety Continue Xanax.  #7 severe peripheral arterial disease Patient complaint of right lower extremity pain. Patient's lower extremities are warm with good pulses. We'll place on Toradol IV when necessary. Continue pain management. Continue aspirin. Outpatient followup with Dr. Burt Knack and vascular surgery.   Code Status: Full Family Communication: Updated patient no family at bedside. Disposition Plan: Home when medically stable.   Consultants:  None  Procedures:  Chest x-ray 11/05/2013    Antibiotics:  Oral Levaquin 11/05/2013  HPI/Subjective: Patient complaining of chronic right lower extremity pain which he states he  is scheduled for surgery for 2. Patient with complaints of shortness of breath.  Objective: Filed Vitals:   11/06/13 1352  BP: 115/62  Pulse: 75  Temp: 97.9 F (36.6 C)  Resp: 20    Intake/Output Summary (Last 24 hours) at 11/06/13 1744 Last data filed at 11/06/13 1704  Gross per 24 hour  Intake   1810 ml  Output   1625 ml  Net    185 ml   Filed Weights   11/05/13 1420 11/06/13 0549  Weight: 100.29 kg (221 lb 1.6 oz) 100.562 kg (221 lb 11.2 oz)    Exam:   General:  NAD  Cardiovascular: Regular rate rhythm no murmurs rubs or gallops.  Respiratory: Bibasilar crackles. Fair movement.  Abdomen: Soft, nontender, nondistended, positive bowel sounds.  Musculoskeletal: 2-3 +BLE edema. +DP pulses bilaterally.  Data Reviewed: Basic Metabolic Panel:  Recent Labs Lab 11/01/13 0408 11/02/13 0350 11/05/13 1150 11/06/13 0508  NA 137 136* 139 136*  K 4.8 4.8 3.8 4.9  CL 101 99 100 98  CO2 24 28 24 24   GLUCOSE 199* 153* 107* 192*  BUN 47* 44* 33* 34*  CREATININE 1.42* 1.46* 1.14 1.27  CALCIUM 9.7 9.2 9.6 8.9   Liver Function Tests: No results found for this basename: AST, ALT, ALKPHOS, BILITOT, PROT, ALBUMIN,  in the last 168 hours No results found for this basename: LIPASE, AMYLASE,  in the last 168 hours No results found for this basename: AMMONIA,  in the last 168 hours CBC:  Recent Labs Lab 11/01/13 0408 11/02/13 0350 11/05/13 1150 11/06/13 0508  WBC 12.4* 13.5* 11.2* 10.1  HGB 12.9* 12.8* 14.5 13.3  HCT 39.9 39.4 42.8 40.8  MCV 94.1 92.3 90.9 93.6  PLT 164 176 146* 136*   Cardiac Enzymes:  Recent Labs Lab 11/05/13 1454 11/05/13  2045  TROPONINI <0.30 <0.30   BNP (last 3 results)  Recent Labs  10/28/13 1910 11/05/13 1150 11/05/13 1453  PROBNP 1388.0* 2630.0* 2892.0*   CBG: No results found for this basename: GLUCAP,  in the last 168 hours  Recent Results (from the past 240 hour(s))  CULTURE, BLOOD (ROUTINE X 2)     Status: None    Collection Time    10/28/13  9:20 PM      Result Value Ref Range Status   Specimen Description BLOOD RIGHT FOREARM   Final   Special Requests BOTTLES DRAWN AEROBIC AND ANAEROBIC 4CC   Final   Culture  Setup Time     Final   Value: 10/29/2013 02:49     Performed at [facility]   Culture     Final   Value: NO GROWTH 5 DAYS     Performed at [facility]   Report Status 11/04/2013 FINAL   Final  CULTURE, BLOOD (ROUTINE X 2)     Status: None   Collection Time    10/28/13  9:25 PM      Result Value Ref Range Status   Specimen Description BLOOD RIGHT ANTECUBITAL   Final   Special Requests BOTTLES DRAWN AEROBIC AND ANAEROBIC 5CC   Final   Culture  Setup Time     Final   Value: 10/29/2013 02:49     Performed at [facility]   Culture     Final   Value: NO GROWTH 5 DAYS     Performed at [facility]   Report Status 11/04/2013 FINAL   Final  MRSA PCR SCREENING     Status: None   Collection Time    10/28/13 11:12 PM      Result Value Ref Range Status   MRSA by PCR NEGATIVE  NEGATIVE Final   Comment:            The GeneXpert MRSA Assay (FDA     approved for NASAL specimens     only), is one component of a     comprehensive MRSA colonization     surveillance program. It is not     intended to diagnose MRSA     infection nor to guide or     monitor treatment for     MRSA infections.  CULTURE, EXPECTORATED SPUTUM-ASSESSMENT     Status: None   Collection Time    10/29/13  8:35 AM      Result Value Ref Range Status   Specimen Description SPUTUM   Final   Special Requests NONE   Final   Sputum evaluation     Final   Value: THIS SPECIMEN IS ACCEPTABLE. RESPIRATORY CULTURE REPORT TO FOLLOW.   Report Status 10/29/2013 FINAL   Final  GRAM STAIN     Status: None   Collection Time    10/29/13  8:35 AM      Result Value Ref Range Status   Specimen Description SPUTUM   Final   Special Requests NONE   Final   Gram Stain     Final   Value: RARE GRAM  NEGATIVE RODS     ABUNDANT WBC PRESENT,BOTH PMN AND MONONUCLEAR     Gram Stain Report Called to,Read Back By and Verified With: [facility] AT 3220 ON [id number] BY POTEAT,S   Report Status 10/29/2013 FINAL   Final  CULTURE, RESPIRATORY (NON-EXPECTORATED)     Status: None   Collection Time    10/29/13  8:35 AM      Result Value Ref Range Status   Specimen Description SPUTUM   Final   Special Requests NONE   Final   Gram Stain     Final   Value: ABUNDANT WBC PRESENT,BOTH PMN AND MONONUCLEAR     NO SQUAMOUS EPITHELIAL CELLS SEEN     NO ORGANISMS SEEN     Performed at [facility]   Culture     Final   Value: FEW CANDIDA ALBICANS     Performed at [facility]   Report Status 10/31/2013 FINAL   Final     Studies: Dg Chest Port 1 View  11/05/2013   CLINICAL DATA:  Shortness of breath and chest pain  EXAM: PORTABLE CHEST - 1 VIEW  COMPARISON:  October 28, 2013  FINDINGS: There is no edema or consolidation. Heart is borderline enlarged with normal pulmonary vascularity. No adenopathy. No pneumothorax. Patient is status post coronary artery bypass grafting.  IMPRESSION: Heart borderline enlarged.  No edema or consolidation.   Electronically Signed   By: Lowella Grip M.D.   On: 11/05/2013 12:25    Scheduled Meds: . apixaban  5 mg Oral BID  . aspirin  81 mg Oral Daily  . doxazosin  8 mg Oral QHS  . furosemide  60 mg Intravenous Q12H  . ipratropium-albuterol  3 mL Nebulization QID  . irbesartan  75 mg Oral Daily  . levofloxacin  500 mg Oral Daily  . methylPREDNISolone (SOLU-MEDROL) injection  60 mg Intravenous 4 times per day  . mometasone-formoterol  2 puff Inhalation BID  . nicotine  14 mg Transdermal Daily   Continuous Infusions:   Principal Problem:   Acute-on-chronic respiratory failure Active Problems:   Acute exacerbation of chronic obstructive pulmonary disease (COPD)   Acute on chronic combined systolic and diastolic congestive heart failure, NYHA class 4    Adjustment disorder with anxiety   CKD (chronic kidney disease) stage 1, GFR 90 ml/min or greater   Chronic systolic CHF (congestive heart failure)   COPD exacerbation   A-fib    Time spent: 40 mins    Eugenie Filler MD  [facility] Pager [phone]. If 7PM-7AM, please contact night-coverage at [email or web address], password [facility] 11/06/2013, 5:44 PM  LOS: 1 day

## 2013-11-06 NOTE — Progress Notes (Signed)
Patient active with [facility] services. He was readmitted prior to contact from last hospital discharge. He is also active with [facility] for RN services. Spoke with patient at bedside who reports he was taking his medications as prescribed when discharged. He states he plans on returning home with home health and friends to assist upon hospital discharge. Will continue to follow. Patient aware [facility] will not interfere or replace services provided by home health. Inpatient RNCM aware that [place] [facility] following.  Marthenia Rolling, MSN- [facility] [phone]

## 2013-11-06 NOTE — Progress Notes (Signed)
[facility]  Patient Status: Active (receiving services up to time of hospitalization)  [facility] is providing the following services: RN and PT  If patient discharges after hours, please call [phone].   Lurlean Leyden 11/06/2013, 11:41 AM

## 2013-11-06 NOTE — Care Management Note (Addendum)
    Page 1 of 2   11/09/2013     12:46:55 PM CARE MANAGEMENT NOTE 11/09/2013  Patient:  MARSDEN, ZAINO   Account Number:  [id number]  Date Initiated:  11/06/2013  Documentation initiated by:  Dessa Phi  Subjective/Objective Assessment:   68 Y/O M ADMITTED W/SOB.COPD.PR:FFMB.READMIT 4/19-4/24-PNA.     Action/Plan:   FROM HOME.ACTIVE [facility] HHRN,& [facility]-HOME 02.   Anticipated DC Date:  11/09/2013   Anticipated DC Plan:  [place]  CM consult      [facility] Choice  Resumption Of Svcs/PTA Provider   Choice offered to / List presented to:  C-1 Patient        [place] arranged  HH-1 RN  [place].   Status of service:  Completed, signed off Medicare Important Message given?   (If response is "NO", the following Medicare IM given date fields will be blank) Date Medicare IM given:  11/09/2013 Date Additional Medicare IM given:  11/09/2013  Discharge Disposition:  [place]  Per UR Regulation:  Reviewed for med. necessity/level of care/duration of stay  If discussed at [place] of Stay Meetings, dates discussed:    Comments:  11/09/13 Jahad Old RN,BSN NCM [phone] D/C HOME W/HHC,ORDERS PLACED.[facility] STEPHANIE AWARE OF HHC ORDERS, & D/C.HAS TRAVEL HOME 02-[facility].  11/06/13 Deitrich Steve RN,BSN NCM [phone] 3:30P PATIENT HAS 2 25%CO-PAY FOR THE COST ELIQUIS,WILL PROVIDE HIM W/DISCOUNT CARD.[place] ALREADY ACTIVE [facility] W/HHRN/PT.AWAIT FINALL [place].USES [facility] FOR HOME 02-HAS DOESN'T HAVE  A TRAVEL HERE IN HOSPITAL.TC [facility] SPOKE TO [facility]-THEY WILL DELIVER A TRAVEL TO HOSPITAL TODAY.  WILL CHECK BENEFIT FOR ELIQUIS SINCE HE HAS INSURANCE.WILL PROVIDE A DISCOUNT CARD.AWAIT FINAL HHRN ORDER,[facility] ALREADY FOLLOWING FOR COMMUNITY F/U.

## 2013-11-07 LAB — BASIC METABOLIC PANEL
BUN: 46 mg/dL — ABNORMAL HIGH (ref 6–23)
BUN: 54 mg/dL — ABNORMAL HIGH (ref 6–23)
CALCIUM: 9 mg/dL (ref 8.4–10.5)
CALCIUM: 9.2 mg/dL (ref 8.4–10.5)
CHLORIDE: 92 meq/L — AB (ref 96–112)
CO2: 27 mEq/L (ref 19–32)
CO2: 25 meq/L (ref 19–32)
CREATININE: 1.75 mg/dL — AB (ref 0.50–1.35)
Chloride: 94 mEq/L — ABNORMAL LOW (ref 96–112)
Creatinine, Ser: 1.71 mg/dL — ABNORMAL HIGH (ref 0.50–1.35)
GFR calc Af Amer: 46 mL/min — ABNORMAL LOW (ref >90)
GFR calc Af Amer: 44 mL/min — ABNORMAL LOW (ref >90)
GFR calc non Af Amer: 39 mL/min — ABNORMAL LOW (ref >90)
GFR calc non Af Amer: 38 mL/min — ABNORMAL LOW (ref >90)
GLUCOSE: 168 mg/dL — AB (ref 70–99)
GLUCOSE: 199 mg/dL — AB (ref 70–99)
Potassium: 4.3 mEq/L (ref 3.7–5.3)
Potassium: 4.5 mEq/L (ref 3.7–5.3)
SODIUM: 134 meq/L — AB (ref 137–147)
Sodium: 133 mEq/L — ABNORMAL LOW (ref 137–147)

## 2013-11-07 LAB — CBC
HCT: 37.1 % — ABNORMAL LOW (ref 39.0–52.0)
HEMOGLOBIN: 12.4 g/dL — AB (ref 13.0–17.0)
MCH: 30.6 pg (ref 26.0–34.0)
MCHC: 33.4 g/dL (ref 30.0–36.0)
MCV: 91.6 fL (ref 78.0–100.0)
Platelets: 137 10*3/uL — ABNORMAL LOW (ref 150–400)
RBC: 4.05 MIL/uL — ABNORMAL LOW (ref 4.22–5.81)
RDW: 14.4 % (ref 11.5–15.5)
WBC: 16.1 10*3/uL — ABNORMAL HIGH (ref 4.0–10.5)

## 2013-11-07 LAB — PRO B NATRIURETIC PEPTIDE: Pro B Natriuretic peptide (BNP): 1197 pg/mL — ABNORMAL HIGH (ref 0–125)

## 2013-11-07 LAB — GLUCOSE, CAPILLARY
GLUCOSE-CAPILLARY: 166 mg/dL — AB (ref 70–99)
GLUCOSE-CAPILLARY: 206 mg/dL — AB (ref 70–99)
Glucose-Capillary: 152 mg/dL — ABNORMAL HIGH (ref 70–99)

## 2013-11-07 MED ORDER — OXYCODONE-ACETAMINOPHEN 5-325 MG PO TABS
1.0000 | ORAL_TABLET | ORAL | Status: DC | PRN
  Administered 2013-11-07 – 2013-11-08 (×7): 2 via ORAL
  Filled 2013-11-07 (×7): qty 2

## 2013-11-07 MED ORDER — INSULIN ASPART 100 UNIT/ML ~~LOC~~ SOLN: 0.0000 [IU] | Freq: Every day | SUBCUTANEOUS | Status: DC

## 2013-11-07 MED ORDER — INSULIN ASPART 100 UNIT/ML ~~LOC~~ SOLN: 0.0000 [IU] | Freq: Three times a day (TID) | SUBCUTANEOUS | Status: DC

## 2013-11-07 MED ORDER — INSULIN ASPART 100 UNIT/ML ~~LOC~~ SOLN: 3.0000 [IU] | Freq: Three times a day (TID) | SUBCUTANEOUS | Status: DC

## 2013-11-07 MED ORDER — HYDROMORPHONE HCL PF 1 MG/ML IJ SOLN
1.0000 mg | INTRAMUSCULAR | Status: DC | PRN
Start: 2013-11-07 — End: 2013-11-09
  Administered 2013-11-07 – 2013-11-09 (×13): 1 mg via INTRAVENOUS
  Filled 2013-11-07 (×14): qty 1

## 2013-11-07 MED ORDER — ONDANSETRON HCL 4 MG PO TABS: 4.0000 mg | ORAL_TABLET | ORAL | Status: DC | PRN

## 2013-11-07 MED ORDER — FUROSEMIDE 10 MG/ML IJ SOLN
40.0000 mg | Freq: Two times a day (BID) | INTRAMUSCULAR | Status: DC
  Administered 2013-11-08 (×2): 40 mg via INTRAVENOUS
  Filled 2013-11-07 (×2): qty 4

## 2013-11-07 NOTE — Progress Notes (Signed)
ANTICOAGULATION CONSULT NOTE - Follow up  Pharmacy Consult for apixaban Indication: atrial fibrillation  Allergies  Allergen Reactions  . Ativan [Lorazepam] Other (See Comments)    Increases agitation, tolerates xanax  . Lisinopril Cough  . Morphine And Related     Hallucinations, too sedated when given with ativan  . Ibuprofen Hives, Nausea And Vomiting and Rash    Tolerates baby aspirin per patient.     Patient Measurements: Height: 5\' 8"  (172.7 cm) Weight: 223 lb 12.3 oz (101.5 kg) IBW/kg (Calculated) : 68.4  Vital Signs: Temp: 97.9 F (36.6 C) (04/29 0512) Temp src: Oral (04/29 0512) BP: 148/69 mmHg (04/29 0512) Pulse Rate: 72 (04/29 0512)  Labs:  Recent Labs  11/05/13 1150 11/05/13 1454 11/05/13 2045 11/06/13 0508 11/07/13 0339  HGB 14.5  --   --  13.3 12.4*  HCT 42.8  --   --  40.8 37.1*  PLT 146*  --   --  136* 137*  CREATININE 1.14  --   --  1.27 1.71*  TROPONINI  --  <0.30 <0.30  --   --    Estimated Creatinine Clearance: 47.7 ml/min (by C-G formula based on Cr of 1.71).  Assessment: 55 YOM admitted with  Chest tightness and dyspnea.  Recently admitted at [facility] with AECOPD.  Orders to start apixaban for afib (CHA2DS2-VASc = 4).    Wt = 100kg  SCr =  1.14 4/27 (h/o CRI) - this is likely below baseline  CBC: hgb WNL, pltc = 137  Goal of Therapy:  Monitor platelets by anticoagulation protocol: Yes Dosed appropriately for indication and patient-specific parameters   Plan:   Apixaban 5mg  BID (does not meet 2/3 criteria for dose reduction, even with SCr > 1.71)  Monitor for bleeding  Education completed 4/28  Minda Ditto PharmD Pager [phone] 11/07/2013, 10:59 AM

## 2013-11-07 NOTE — Progress Notes (Signed)
Patient ID: Lucas [name], male   DOB: October 28, 1945, 68 y.o.   MRN: [id number]  [facility] PROGRESS NOTE  GENIE MIRABAL RWE:[id number] DOB: 15-Jun-1946 DOA: 11/05/2013 PCP: Salena Saner., MD  Brief narrative: 68 y.o. male with PMH of CAD, CABG, ICM EF of 30-35%, COPD/chronic respiratory failure on 2L home O2, peripheral vascular disease, severe anxiety was just discharged from [facility] long hospital 4 days prior to this admission after treatment for COPD exacerbation and possible pneumonia, was sent home on Levaquin and a prednisone taper with advice for quiq followup with PCP and Dr. Chase Caller. He reportedly did well initially after discharge home but then started noticing chest tightness, wheezing, increased dyspnea on exertion.  Principal Problem:   Acute-on-chronic respiratory failure - this is multifactorial in etiology and secondary to acute on chronic COPD, systolic CHF - pt is clinically stable this am but still wheezing and with course breath sounds bilaterally  - will continue BD's scheduled and as needed, solumedrol, ABX - will also continue Lasix and will monitor clinical response Active Problems:   Adjustment disorder with anxiety - stable this AM   CKD (chronic kidney disease) stage 1, GFR 90 ml/min or greater - Cr is up over the past 24 hours - this is secondary to Lasix and likely made worse with Avapro - will hold Avapro and other nephrotoxins until renal function stabilizes  - will also lower the dose of Lasix form 60 mg IV BID to 40 mg IV BID  - repeat BMP in AM   Acute exacerbation of chronic obstructive pulmonary disease (COPD) - treatment with BD's scheduled and as needed as noted above - solumedrol and ABX   Acute on chronic combined systolic and diastolic congestive heart failure, NYHA class 4 - lower the dose of Lasix as noted above - weight today is 223 lbs - strict I's and O's,daily weights    A-fib - rate controlled - continue Eliquis    Consultants:  None  Procedures/Studies:  None  Antibiotics:  Levaquin   Code Status: Full Family Communication: Pt at bedside Disposition Plan: Home when medically stable  HPI/Subjective: No events overnight.   Objective: Filed Vitals:   11/07/13 0512 11/07/13 0811 11/07/13 1300 11/07/13 1501  BP: 148/69  129/70   Pulse: 72  70   Temp: 97.9 F (36.6 C)  97.8 F (36.6 C)   TempSrc: Oral  Oral   Resp: 20  16   Height:      Weight:      SpO2: 96% 94% 97% 95%    Intake/Output Summary (Last 24 hours) at 11/07/13 1707 Last data filed at 11/07/13 1300  Gross per 24 hour  Intake   1140 ml  Output   1850 ml  Net   -710 ml    Exam:   General:  Pt is alert, follows commands appropriately, not in acute distress  Cardiovascular: Regular rate and rhythm, S1/S2, no murmurs, no rubs, no gallops  Respiratory: Course breath sounds, expiratory wheezing   Abdomen: Soft, non tender, non distended, bowel sounds present, no guarding  Extremities: + 1 bilateral LE pitting edema, pulses DP and PT palpable bilaterally  Neuro: Grossly nonfocal  Data Reviewed: Basic Metabolic Panel:  Recent Labs Lab 11/01/13 0408 11/02/13 0350 11/05/13 1150 11/06/13 0508 11/07/13 0339  NA 137 136* 139 136* 134*  K 4.8 4.8 3.8 4.9 4.3  CL 101 99 100 98 94*  CO2 24 28 24 24 27   GLUCOSE 199* 153* 107* 192*  168*  BUN 47* 44* 33* 34* 46*  CREATININE 1.42* 1.46* 1.14 1.27 1.71*  CALCIUM 9.7 9.2 9.6 8.9 9.0   CBC:  Recent Labs Lab 11/01/13 0408 11/02/13 0350 11/05/13 1150 11/06/13 0508 11/07/13 0339  WBC 12.4* 13.5* 11.2* 10.1 16.1*  HGB 12.9* 12.8* 14.5 13.3 12.4*  HCT 39.9 39.4 42.8 40.8 37.1*  MCV 94.1 92.3 90.9 93.6 91.6  PLT 164 176 146* 136* 137*   Cardiac Enzymes:  Recent Labs Lab 11/05/13 1454 11/05/13 2045  TROPONINI <0.30 <0.30   CBG:  Recent Labs Lab 11/07/13 1144  GLUCAP 166*    Recent Results (from the past 240 hour(s))  CULTURE, BLOOD (ROUTINE  X 2)     Status: None   Collection Time    10/28/13  9:20 PM      Result Value Ref Range Status   Specimen Description BLOOD RIGHT FOREARM   Final   Special Requests BOTTLES DRAWN AEROBIC AND ANAEROBIC 4CC   Final   Culture  Setup Time     Final   Value: 10/29/2013 02:49     Performed at [facility]   Culture     Final   Value: NO GROWTH 5 DAYS     Performed at [facility]   Report Status 11/04/2013 FINAL   Final  CULTURE, BLOOD (ROUTINE X 2)     Status: None   Collection Time    10/28/13  9:25 PM      Result Value Ref Range Status   Specimen Description BLOOD RIGHT ANTECUBITAL   Final   Special Requests BOTTLES DRAWN AEROBIC AND ANAEROBIC 5CC   Final   Culture  Setup Time     Final   Value: 10/29/2013 02:49     Performed at [facility]   Culture     Final   Value: NO GROWTH 5 DAYS     Performed at [facility]   Report Status 11/04/2013 FINAL   Final  MRSA PCR SCREENING     Status: None   Collection Time    10/28/13 11:12 PM      Result Value Ref Range Status   MRSA by PCR NEGATIVE  NEGATIVE Final   Comment:            The GeneXpert MRSA Assay (FDA     approved for NASAL specimens     only), is one component of a     comprehensive MRSA colonization     surveillance program. It is not     intended to diagnose MRSA     infection nor to guide or     monitor treatment for     MRSA infections.  CULTURE, EXPECTORATED SPUTUM-ASSESSMENT     Status: None   Collection Time    10/29/13  8:35 AM      Result Value Ref Range Status   Specimen Description SPUTUM   Final   Special Requests NONE   Final   Sputum evaluation     Final   Value: THIS SPECIMEN IS ACCEPTABLE. RESPIRATORY CULTURE REPORT TO FOLLOW.   Report Status 10/29/2013 FINAL   Final  GRAM STAIN     Status: None   Collection Time    10/29/13  8:35 AM      Result Value Ref Range Status   Specimen Description SPUTUM   Final   Special Requests NONE   Final   Gram Stain     Final    Value: RARE  GRAM NEGATIVE RODS     ABUNDANT WBC PRESENT,BOTH PMN AND MONONUCLEAR     Gram Stain Report Called to,Read Back By and Verified With: [facility] AT 0944 ON [id number] BY POTEAT,S   Report Status 10/29/2013 FINAL   Final  CULTURE, RESPIRATORY (NON-EXPECTORATED)     Status: None   Collection Time    10/29/13  8:35 AM      Result Value Ref Range Status   Specimen Description SPUTUM   Final   Special Requests NONE   Final   Gram Stain     Final   Value: ABUNDANT WBC PRESENT,BOTH PMN AND MONONUCLEAR     NO SQUAMOUS EPITHELIAL CELLS SEEN     NO ORGANISMS SEEN     Performed at [facility]   Culture     Final   Value: FEW CANDIDA ALBICANS     Performed at [facility]   Report Status 10/31/2013 FINAL   Final     Scheduled Meds: . apixaban  5 mg Oral BID  . aspirin  81 mg Oral Daily  . doxazosin  8 mg Oral QHS  . furosemide  60 mg Intravenous Q12H  . ipratropium-albuterol  3 mL Nebulization QID  . irbesartan  75 mg Oral Daily  . levofloxacin  500 mg Oral Daily  . methylPREDNISolone (SOLU-MEDROL) injection  60 mg Intravenous 3 times per day  . mometasone-formoterol  2 puff Inhalation BID  . nicotine  14 mg Transdermal Daily   Continuous Infusions:    Theodis Blaze, MD  [facility] Pager [phone]  If 7PM-7AM, please contact night-coverage [email or web address] Password [email or web address] 11/07/2013, 5:07 PM   LOS: 2 days

## 2013-11-08 DIAGNOSIS — I517 Cardiomegaly: Secondary | ICD-10-CM

## 2013-11-08 LAB — CBC
HEMATOCRIT: 38.1 % — AB (ref 39.0–52.0)
Hemoglobin: 12.7 g/dL — ABNORMAL LOW (ref 13.0–17.0)
MCH: 30.6 pg (ref 26.0–34.0)
MCHC: 33.3 g/dL (ref 30.0–36.0)
MCV: 91.8 fL (ref 78.0–100.0)
PLATELETS: 125 10*3/uL — AB (ref 150–400)
RBC: 4.15 MIL/uL — ABNORMAL LOW (ref 4.22–5.81)
RDW: 14.7 % (ref 11.5–15.5)
WBC: 16.4 10*3/uL — AB (ref 4.0–10.5)

## 2013-11-08 LAB — GLUCOSE, CAPILLARY
GLUCOSE-CAPILLARY: 166 mg/dL — AB (ref 70–99)
GLUCOSE-CAPILLARY: 188 mg/dL — AB (ref 70–99)
GLUCOSE-CAPILLARY: 230 mg/dL — AB (ref 70–99)
Glucose-Capillary: 190 mg/dL — ABNORMAL HIGH (ref 70–99)

## 2013-11-08 LAB — PRO B NATRIURETIC PEPTIDE: Pro B Natriuretic peptide (BNP): 766.9 pg/mL — ABNORMAL HIGH (ref 0–125)

## 2013-11-08 MED ORDER — HYDROCODONE-ACETAMINOPHEN 5-325 MG PO TABS
1.0000 | ORAL_TABLET | ORAL | Status: DC | PRN
  Administered 2013-11-08 – 2013-11-09 (×4): 2 via ORAL
  Filled 2013-11-08 (×4): qty 2

## 2013-11-08 MED ORDER — FUROSEMIDE 40 MG PO TABS
40.0000 mg | ORAL_TABLET | Freq: Two times a day (BID) | ORAL | Status: DC
  Administered 2013-11-08 – 2013-11-09 (×2): 40 mg via ORAL
  Filled 2013-11-08 (×4): qty 1

## 2013-11-08 MED ORDER — PREDNISONE 50 MG PO TABS
50.0000 mg | ORAL_TABLET | Freq: Every day | ORAL | Status: DC
  Administered 2013-11-09: 50 mg via ORAL
  Filled 2013-11-08 (×2): qty 1

## 2013-11-08 NOTE — Progress Notes (Signed)
PT Cancellation Note / Screen  Patient Details Name: Lucas [name] MRN: [id number] DOB: [birth year]   Cancelled Treatment:    Reason Eval/Treat Not Completed: PT screened, no needs identified, will sign off Pt seen ambulating independently in hallway with oxygen tank earlier this morning (by this PT).  Spoke with pt briefly at bedside and he reports he has no PT needs at this time.  PT to sign off.   Junius Argyle 11/08/2013, 12:23 PM Carmelia Bake, PT, DPT 11/08/2013 Pager: [phone]

## 2013-11-08 NOTE — Progress Notes (Signed)
Patient ID: Lucas [name], male   DOB: [birth year], 68 y.o.   MRN: [id number]  [facility] PROGRESS NOTE  TAHJE BORAWSKI [id number] DOB: [birth year] DOA: 11/05/2013 PCP: Salena Saner., MD  Brief narrative:  68 y.o. male with PMH of CAD, CABG, ICM EF of 30-35%, COPD/chronic respiratory failure on 2L home O2, peripheral vascular disease, severe anxiety was just discharged from [facility] long hospital 4 days prior to this admission after treatment for COPD exacerbation and possible pneumonia, was sent home on Levaquin and a prednisone taper with advice for quiq followup with PCP and Dr. Chase Caller. He reportedly did well initially after discharge home but then started noticing chest tightness, wheezing, increased dyspnea on exertion.   Principal Problem:  Acute-on-chronic respiratory failure  - this is multifactorial in etiology and secondary to acute on chronic COPD, systolic CHF  - pt is clinically stable this am, less rhonchi and no wheezing  - will continue BD's scheduled and as needed, solumedrol, ABX  - will also continue Lasix and will monitor clinical response  Active Problems:  Adjustment disorder with anxiety  - stable this AM  CKD (chronic kidney disease) stage 1, GFR 90 ml/min or greater  - Cr is stable over the past 24 hours - will continue to  hold Avapro and other nephrotoxins until renal function stabilizes  - continue Lasix 40 mg BID but will change to PO today  - repeat BMP in AM  Acute exacerbation of chronic obstructive pulmonary disease (COPD)  - treatment with BD's scheduled and as needed as noted above  - solumedrol and ABX  Acute on chronic combined systolic and diastolic congestive heart failure, NYHA class 4  - lower the dose of Lasix as noted above  - weight today is 223 lbs and unchanged over the past 24 hours  - strict I's and O's,daily weights  A-fib  - rate controlled  - continue Eliquis   Consultants:  None Procedures/Studies:   None Antibiotics:  Levaquin   Code Status: Full  Family Communication: Pt at bedside  Disposition Plan: Home when medically stable  HPI/Subjective: No events overnight.   Objective: Filed Vitals:   11/08/13 0522 11/08/13 1003 11/08/13 1243 11/08/13 1250  BP: 123/77  131/61   Pulse: 83  81   Temp: 97.8 F (36.6 C)  98 F (36.7 C)   TempSrc: Oral  Oral   Resp: 20  20   Height:      Weight:      SpO2: 97% 92% 94% 93%    Intake/Output Summary (Last 24 hours) at 11/08/13 1440 Last data filed at 11/08/13 0915  Gross per 24 hour  Intake    960 ml  Output   2300 ml  Net  -1340 ml    Exam:   General:  Pt is alert, follows commands appropriately, not in acute distress  Cardiovascular: Regular rate and rhythm, S1/S2, no murmurs, no rubs, no gallops  Respiratory: Clear to auscultation bilaterally, mild expiratory wheezing and mild bibasilar crackles   Abdomen: Soft, non tender, non distended, bowel sounds present, no guarding  Extremities: +1 bilateral LE pitting edema, pulses DP and PT palpable bilaterally  Neuro: Grossly nonfocal  Data Reviewed: Basic Metabolic Panel:  Recent Labs Lab 11/02/13 0350 11/05/13 1150 11/06/13 0508 11/07/13 0339 11/07/13 1828  NA 136* 139 136* 134* 133*  K 4.8 3.8 4.9 4.3 4.5  CL 99 100 98 94* 92*  CO2 28 24 24 27 25   GLUCOSE 153* 107* 192*  168* 199*  BUN 44* 33* 34* 46* 54*  CREATININE 1.46* 1.14 1.27 1.71* 1.75*  CALCIUM 9.2 9.6 8.9 9.0 9.2   CBC:  Recent Labs Lab 11/02/13 0350 11/05/13 1150 11/06/13 0508 11/07/13 0339 11/08/13 0435  WBC 13.5* 11.2* 10.1 16.1* 16.4*  HGB 12.8* 14.5 13.3 12.4* 12.7*  HCT 39.4 42.8 40.8 37.1* 38.1*  MCV 92.3 90.9 93.6 91.6 91.8  PLT 176 146* 136* 137* 125*   Cardiac Enzymes:  Recent Labs Lab 11/05/13 1454 11/05/13 2045  TROPONINI <0.30 <0.30   CBG:  Recent Labs Lab 11/07/13 1144 11/07/13 1740 11/07/13 2139 11/08/13 0713 11/08/13 1155  GLUCAP 166* 152* 206* 166*  190*   Scheduled Meds: . apixaban  5 mg Oral BID  . aspirin  81 mg Oral Daily  . doxazosin  8 mg Oral QHS  . furosemide  40 mg Intravenous Q12H  . ipratropium-albuterol  3 mL Nebulization QID  . levofloxacin  500 mg Oral Daily  . methylPREDNISolone (SOLU-MEDROL) injection  60 mg Intravenous 3 times per day  . mometasone-formoterol  2 puff Inhalation BID  . nicotine  14 mg Transdermal Daily   Continuous Infusions:   Theodis Blaze, MD  [facility] Pager [phone]  If 7PM-7AM, please contact night-coverage [email or web address] Password [email or web address] 11/08/2013, 2:40 PM   LOS: 3 days

## 2013-11-08 NOTE — Progress Notes (Signed)
Echocardiogram 2D Echocardiogram has been performed.  Lucas [name] 11/08/2013, 10:21 AM

## 2013-11-09 LAB — CBC
HCT: 39.4 % (ref 39.0–52.0)
HEMOGLOBIN: 12.8 g/dL — AB (ref 13.0–17.0)
MCH: 30.2 pg (ref 26.0–34.0)
MCHC: 32.5 g/dL (ref 30.0–36.0)
MCV: 92.9 fL (ref 78.0–100.0)
Platelets: 121 10*3/uL — ABNORMAL LOW (ref 150–400)
RBC: 4.24 MIL/uL (ref 4.22–5.81)
RDW: 14.7 % (ref 11.5–15.5)
WBC: 15.3 10*3/uL — ABNORMAL HIGH (ref 4.0–10.5)

## 2013-11-09 LAB — BASIC METABOLIC PANEL
BUN: 59 mg/dL — AB (ref 6–23)
CHLORIDE: 93 meq/L — AB (ref 96–112)
CO2: 31 meq/L (ref 19–32)
Calcium: 8.9 mg/dL (ref 8.4–10.5)
Creatinine, Ser: 1.87 mg/dL — ABNORMAL HIGH (ref 0.50–1.35)
GFR calc Af Amer: 41 mL/min — ABNORMAL LOW (ref >90)
GFR calc non Af Amer: 35 mL/min — ABNORMAL LOW (ref >90)
GLUCOSE: 176 mg/dL — AB (ref 70–99)
POTASSIUM: 5 meq/L (ref 3.7–5.3)
SODIUM: 135 meq/L — AB (ref 137–147)

## 2013-11-09 LAB — PRO B NATRIURETIC PEPTIDE: Pro B Natriuretic peptide (BNP): 813.4 pg/mL — ABNORMAL HIGH (ref 0–125)

## 2013-11-09 LAB — GLUCOSE, CAPILLARY: GLUCOSE-CAPILLARY: 91 mg/dL (ref 70–99)

## 2013-11-09 MED ORDER — HYDROMORPHONE HCL 4 MG PO TABS: 4.0000 mg | ORAL_TABLET | ORAL | Status: DC | PRN

## 2013-11-09 MED ORDER — LEVOFLOXACIN 500 MG PO TABS: 500.0000 mg | ORAL_TABLET | Freq: Every day | ORAL | Status: DC

## 2013-11-09 MED ORDER — ALPRAZOLAM 1 MG PO TABS: 1.0000 mg | ORAL_TABLET | Freq: Three times a day (TID) | ORAL | Status: DC | PRN

## 2013-11-09 MED ORDER — FUROSEMIDE 40 MG PO TABS: 40.0000 mg | ORAL_TABLET | Freq: Every evening | ORAL | Status: DC

## 2013-11-09 MED ORDER — APIXABAN 5 MG PO TABS: 5.0000 mg | ORAL_TABLET | Freq: Two times a day (BID) | ORAL | Status: DC

## 2013-11-09 MED ORDER — PREDNISONE 10 MG PO TABS: ORAL_TABLET | ORAL | Status: DC

## 2013-11-09 MED ORDER — HYDROCODONE-ACETAMINOPHEN 5-325 MG PO TABS: 1.0000 | ORAL_TABLET | ORAL | Status: DC | PRN

## 2013-11-09 NOTE — Progress Notes (Signed)
PT Cancellation Note / Screen  Patient Details Name: ULYSSE SIEMEN MRN: [id number] DOB: 31-Dec-1945   Cancelled Treatment:    Reason Eval/Treat Not Completed: PT screened, no needs identified, will sign off  Please see screen from yesterday 4/30.     Junius Argyle 11/09/2013, 8:20 AM

## 2013-11-09 NOTE — Discharge Summary (Signed)
Physician Discharge Summary  Lucas [name] AST:[id number] DOB: 1945/07/22 DOA: 11/05/2013  PCP: Salena Saner., MD  Admit date: 11/05/2013 Discharge date: 11/09/2013  Recommendations for Outpatient Follow-up:  1. Pt will need to follow up with PCP in 2-3 weeks post discharge 2. Please obtain BMP to evaluate electrolytes and kidney function 3. Please also check CBC to evaluate Hg and Hct levels 4. Please note that Valsartan was stopped due to renal failure, resume only once renal function stabilizes   Discharge Diagnoses:   Acute-on-chronic respiratory failure Principal Problem:   Acute-on-chronic respiratory failure Active Problems:   Adjustment disorder with anxiety   CKD (chronic kidney disease) stage 1, GFR 90 ml/min or greater   Acute exacerbation of chronic obstructive pulmonary disease (COPD)   Acute on chronic combined systolic and diastolic congestive heart failure, NYHA class 4   Chronic systolic CHF (congestive heart failure)   COPD exacerbation   A-fib  Discharge Condition: Stable  Diet recommendation: Heart healthy diet discussed in details   Brief narrative:  68 y.o. male with PMH of CAD, CABG, ICM EF of 30-35%, COPD/chronic respiratory failure on 2L home O2, peripheral vascular disease, severe anxiety was just discharged from [facility] long hospital 4 days prior to this admission after treatment for COPD exacerbation and possible pneumonia, was sent home on Levaquin and a prednisone taper with advice for quiq followup with PCP and Dr. Chase Caller. He reportedly did well initially after discharge home but then started noticing chest tightness, wheezing, increased dyspnea on exertion.   Principal Problem:  Acute-on-chronic respiratory failure  - this is multifactorial in etiology and secondary to acute on chronic COPD, systolic CHF  - pt is clinically stable this am, less rhonchi and no wheezing  - will continue BD's scheduled and as needed, pt transitioned to oral  Prednisone successfully and wants to go home  - will also continue Lasix as per home medical regimen  Active Problems:  Adjustment disorder with anxiety  - stable this AM  CKD (chronic kidney disease) stage 1, GFR 90 ml/min or greater  - Cr is stable over the past 24 hours  - continue to hold Avapro and other nephrotoxins until renal function stabilizes  - continue Lasix 40 mg PO QD  Acute exacerbation of chronic obstructive pulmonary disease (COPD)  - treatment with BD's scheduled and as needed as noted above  - solumedrol provided on admission and no wheezing on exam this AM  - transitioned successfully to Prednisone  Acute on chronic combined systolic and diastolic congestive heart failure, NYHA class 4  - lower the dose of Lasix as noted above  - weight today is 223 lbs and unchanged over the past 24 hours  A-fib  - rate controlled  - continue Eliquis   Consultants:  None Procedures/Studies:  None Antibiotics:  Levaquin for two more days post discharge   Code Status: Full  Family Communication: Pt at bedside  Disposition Plan: Home    Discharge Exam: Filed Vitals:   11/09/13 0522  BP: 149/73  Pulse: 68  Temp: 97.8 F (36.6 C)  Resp: 20   Filed Vitals:   11/08/13 2135 11/09/13 0500 11/09/13 0522 11/09/13 0906  BP: 135/63  149/73   Pulse: 59  68   Temp: 97.9 F (36.6 C)  97.8 F (36.6 C)   TempSrc: Oral  Oral   Resp: 20  20   Height:      Weight:  102.3 kg (225 lb 8.5 oz)    SpO2:  94%  98% 95%    General: Pt is alert, follows commands appropriately, not in acute distress Cardiovascular: Regular rate and rhythm, S1/S2 +, no murmurs, no rubs, no gallops Respiratory: Clear to auscultation bilaterally, no wheezing, no crackles, no rhonchi Abdominal: Soft, non tender, non distended, bowel sounds +, no guarding  Discharge Instructions  Discharge Orders   Future Appointments Provider Department Dept Phone   11/15/2013 9:15 AM Josue Hector, MD [facility] [phone]   11/30/2013 9:00 AM Melvenia Needles, NP [facility] [phone]   12/10/2013 1:00 PM Serafina Mitchell, MD [facility] [phone]   Future Orders Complete By Expires   Diet - low sodium heart healthy  As directed    Increase activity slowly  As directed        Medication List    STOP taking these medications       valsartan 40 MG tablet  Commonly known as:  DIOVAN      TAKE these medications       albuterol (2.5 MG/3ML) 0.083% nebulizer solution  Commonly known as:  PROVENTIL  Take 2.5 mg by nebulization 4 (four) times daily.     albuterol 108 (90 BASE) MCG/ACT inhaler  Commonly known as:  PROVENTIL HFA;VENTOLIN HFA  Inhale 2 puffs into the lungs every 6 (six) hours as needed for wheezing or shortness of breath. Shortness of breath     ALPRAZolam 1 MG tablet  Commonly known as:  XANAX  Take 1 tablet (1 mg total) by mouth 3 (three) times daily as needed for anxiety.     apixaban 5 MG Tabs tablet  Commonly known as:  ELIQUIS  Take 1 tablet (5 mg total) by mouth 2 (two) times daily.     aspirin EC 81 MG tablet  Take 81 mg by mouth daily.     doxazosin 8 MG tablet  Commonly known as:  CARDURA  Take 8 mg by mouth at bedtime.     furosemide 40 MG tablet  Commonly known as:  LASIX  Take 1 tablet (40 mg total) by mouth every evening.     HYDROcodone-acetaminophen 5-325 MG per tablet  Commonly known as:  NORCO/VICODIN  Take 1-2 tablets by mouth every 4 (four) hours as needed (pain).     HYDROmorphone 4 MG tablet  Commonly known as:  DILAUDID  Take 1 tablet (4 mg total) by mouth every 4 (four) hours as needed for severe pain (breakthrough pain).     ipratropium 0.02 % nebulizer solution  Commonly known as:  ATROVENT  Take 0.5 mg by nebulization 4 (four) times daily.     Ipratropium-Albuterol 20-100 MCG/ACT Aers respimat  Commonly known as:  COMBIVENT RESPIMAT  Inhale 1 puff into the lungs every 6  (six) hours as needed for wheezing or shortness of breath.     levofloxacin 500 MG tablet  Commonly known as:  LEVAQUIN  Take 1 tablet (500 mg total) by mouth daily. For 2 days     mometasone-formoterol 100-5 MCG/ACT Aero  Commonly known as:  DULERA  Inhale 2 puffs into the lungs 2 (two) times daily.     NITROSTAT 0.3 MG SL tablet  Generic drug:  nitroGLYCERIN  Place 0.3 mg under the tongue every 5 (five) minutes as needed for chest pain.     predniSONE 10 MG tablet  Commonly known as:  DELTASONE  Take 40mg  for 1 day then 30mg  for 1day then 20mg  for  1day then 10mg  for 1day then STOP           Follow-up Information   Schedule an appointment as soon as possible for a visit with Salena Saner., MD.   Specialty:  Internal Medicine   Contact information:   [address] [place] [address] [phone]       Follow up with Faye Ramsay, MD. (As needed, If symptoms worsen, call my cell phone [phone])    Specialty:  Internal Medicine   Contact information:   [address] [place] [address] [phone]        The results of significant diagnostics from this hospitalization (including imaging, microbiology, ancillary and laboratory) are listed below for reference.     Microbiology: No results found for this or any previous visit (from the past 240 hour(s)).   Labs: Basic Metabolic Panel:  Recent Labs Lab 11/05/13 1150 11/06/13 0508 11/07/13 0339 11/07/13 1828 11/09/13 0328  NA 139 136* 134* 133* 135*  K 3.8 4.9 4.3 4.5 5.0  CL 100 98 94* 92* 93*  CO2 24 24 27 25 31   GLUCOSE 107* 192* 168* 199* 176*  BUN 33* 34* 46* 54* 59*  CREATININE 1.14 1.27 1.71* 1.75* 1.87*  CALCIUM 9.6 8.9 9.0 9.2 8.9   CBC:  Recent Labs Lab 11/05/13 1150 11/06/13 0508 11/07/13 0339 11/08/13 0435 11/09/13 0328  WBC 11.2* 10.1 16.1* 16.4* 15.3*  HGB 14.5 13.3 12.4* 12.7* 12.8*  HCT 42.8 40.8 37.1* 38.1* 39.4  MCV 90.9 93.6 91.6 91.8  92.9  PLT 146* 136* 137* 125* 121*   Cardiac Enzymes:  Recent Labs Lab 11/05/13 1454 11/05/13 2045  TROPONINI <0.30 <0.30   BNP: BNP (last 3 results)  Recent Labs  11/07/13 0339 11/08/13 0435 11/09/13 0328  PROBNP 1197.0* 766.9* 813.4*   CBG:  Recent Labs Lab 11/08/13 0713 11/08/13 1155 11/08/13 1638 11/08/13 2133 11/09/13 0735  GLUCAP 166* 190* 188* 230* 91     SIGNED: Time coordinating discharge: Over 30 minutes  Theodis Blaze, MD  [facility] 11/09/2013, 10:13 AM Pager [phone]  If 7PM-7AM, please contact night-coverage [email or web address] Password [email or web address]

## 2013-11-13 ENCOUNTER — Institutional Professional Consult (permissible substitution): Payer: Medicare Other | Admitting: Cardiovascular Disease

## 2013-11-14 ENCOUNTER — Emergency Department (HOSPITAL_COMMUNITY): Payer: PRIVATE HEALTH INSURANCE

## 2013-11-14 ENCOUNTER — Encounter (HOSPITAL_COMMUNITY): Payer: Self-pay | Admitting: Emergency Medicine

## 2013-11-14 ENCOUNTER — Telehealth: Payer: Self-pay | Admitting: Internal Medicine

## 2013-11-14 ENCOUNTER — Inpatient Hospital Stay (HOSPITAL_COMMUNITY)
Admission: EM | Admit: 2013-11-14 | Discharge: 2013-11-18 | DRG: 190 | Disposition: A | Discharge status: Home / Self Care | Payer: PRIVATE HEALTH INSURANCE | Attending: Internal Medicine | Admitting: Internal Medicine

## 2013-11-14 ENCOUNTER — Telehealth: Payer: Self-pay | Admitting: Cardiovascular Disease

## 2013-11-14 DIAGNOSIS — I10 Essential (primary) hypertension: Secondary | ICD-10-CM

## 2013-11-14 DIAGNOSIS — Z9981 Dependence on supplemental oxygen: Secondary | ICD-10-CM

## 2013-11-14 DIAGNOSIS — Z6833 Body mass index (BMI) 33.0-33.9, adult: Secondary | ICD-10-CM

## 2013-11-14 DIAGNOSIS — Z01811 Encounter for preprocedural respiratory examination: Secondary | ICD-10-CM

## 2013-11-14 DIAGNOSIS — J9621 Acute and chronic respiratory failure with hypoxia: Secondary | ICD-10-CM

## 2013-11-14 DIAGNOSIS — I5022 Chronic systolic (congestive) heart failure: Secondary | ICD-10-CM

## 2013-11-14 DIAGNOSIS — M129 Arthropathy, unspecified: Secondary | ICD-10-CM | POA: Diagnosis present

## 2013-11-14 DIAGNOSIS — I4891 Unspecified atrial fibrillation: Secondary | ICD-10-CM

## 2013-11-14 DIAGNOSIS — I509 Heart failure, unspecified: Secondary | ICD-10-CM | POA: Diagnosis present

## 2013-11-14 DIAGNOSIS — I209 Angina pectoris, unspecified: Secondary | ICD-10-CM

## 2013-11-14 DIAGNOSIS — Z9114 Patient's other noncompliance with medication regimen: Secondary | ICD-10-CM

## 2013-11-14 DIAGNOSIS — Z87898 Personal history of other specified conditions: Secondary | ICD-10-CM

## 2013-11-14 DIAGNOSIS — N390 Urinary tract infection, site not specified: Secondary | ICD-10-CM

## 2013-11-14 DIAGNOSIS — C61 Malignant neoplasm of prostate: Secondary | ICD-10-CM

## 2013-11-14 DIAGNOSIS — J449 Chronic obstructive pulmonary disease, unspecified: Secondary | ICD-10-CM

## 2013-11-14 DIAGNOSIS — I5033 Acute on chronic diastolic (congestive) heart failure: Secondary | ICD-10-CM

## 2013-11-14 DIAGNOSIS — Z833 Family history of diabetes mellitus: Secondary | ICD-10-CM

## 2013-11-14 DIAGNOSIS — I255 Ischemic cardiomyopathy: Secondary | ICD-10-CM

## 2013-11-14 DIAGNOSIS — I129 Hypertensive chronic kidney disease with stage 1 through stage 4 chronic kidney disease, or unspecified chronic kidney disease: Secondary | ICD-10-CM | POA: Diagnosis present

## 2013-11-14 DIAGNOSIS — Z9119 Patient's noncompliance with other medical treatment and regimen: Secondary | ICD-10-CM

## 2013-11-14 DIAGNOSIS — I2510 Atherosclerotic heart disease of native coronary artery without angina pectoris: Secondary | ICD-10-CM | POA: Diagnosis present

## 2013-11-14 DIAGNOSIS — R609 Edema, unspecified: Secondary | ICD-10-CM | POA: Diagnosis present

## 2013-11-14 DIAGNOSIS — Z888 Allergy status to other drugs, medicaments and biological substances status: Secondary | ICD-10-CM

## 2013-11-14 DIAGNOSIS — A419 Sepsis, unspecified organism: Secondary | ICD-10-CM

## 2013-11-14 DIAGNOSIS — E669 Obesity, unspecified: Secondary | ICD-10-CM

## 2013-11-14 DIAGNOSIS — I739 Peripheral vascular disease, unspecified: Secondary | ICD-10-CM | POA: Diagnosis present

## 2013-11-14 DIAGNOSIS — E785 Hyperlipidemia, unspecified: Secondary | ICD-10-CM | POA: Diagnosis present

## 2013-11-14 DIAGNOSIS — N400 Enlarged prostate without lower urinary tract symptoms: Secondary | ICD-10-CM | POA: Diagnosis present

## 2013-11-14 DIAGNOSIS — Z9861 Coronary angioplasty status: Secondary | ICD-10-CM

## 2013-11-14 DIAGNOSIS — Z79899 Other long term (current) drug therapy: Secondary | ICD-10-CM

## 2013-11-14 DIAGNOSIS — R0602 Shortness of breath: Secondary | ICD-10-CM

## 2013-11-14 DIAGNOSIS — R7303 Prediabetes: Secondary | ICD-10-CM

## 2013-11-14 DIAGNOSIS — Z87891 Personal history of nicotine dependence: Secondary | ICD-10-CM

## 2013-11-14 DIAGNOSIS — F17200 Nicotine dependence, unspecified, uncomplicated: Secondary | ICD-10-CM

## 2013-11-14 DIAGNOSIS — Z885 Allergy status to narcotic agent status: Secondary | ICD-10-CM

## 2013-11-14 DIAGNOSIS — Z8249 Family history of ischemic heart disease and other diseases of the circulatory system: Secondary | ICD-10-CM

## 2013-11-14 DIAGNOSIS — Z91199 Patient's noncompliance with other medical treatment and regimen due to unspecified reason: Secondary | ICD-10-CM

## 2013-11-14 DIAGNOSIS — Z129 Encounter for screening for malignant neoplasm, site unspecified: Secondary | ICD-10-CM

## 2013-11-14 DIAGNOSIS — Z951 Presence of aortocoronary bypass graft: Secondary | ICD-10-CM

## 2013-11-14 DIAGNOSIS — J189 Pneumonia, unspecified organism: Secondary | ICD-10-CM

## 2013-11-14 DIAGNOSIS — Z7982 Long term (current) use of aspirin: Secondary | ICD-10-CM

## 2013-11-14 DIAGNOSIS — I5023 Acute on chronic systolic (congestive) heart failure: Secondary | ICD-10-CM | POA: Diagnosis present

## 2013-11-14 DIAGNOSIS — R300 Dysuria: Secondary | ICD-10-CM

## 2013-11-14 DIAGNOSIS — G4733 Obstructive sleep apnea (adult) (pediatric): Secondary | ICD-10-CM

## 2013-11-14 DIAGNOSIS — Z85528 Personal history of other malignant neoplasm of kidney: Secondary | ICD-10-CM

## 2013-11-14 DIAGNOSIS — N181 Chronic kidney disease, stage 1: Secondary | ICD-10-CM | POA: Diagnosis present

## 2013-11-14 DIAGNOSIS — I5043 Acute on chronic combined systolic (congestive) and diastolic (congestive) heart failure: Secondary | ICD-10-CM | POA: Diagnosis present

## 2013-11-14 DIAGNOSIS — J9620 Acute and chronic respiratory failure, unspecified whether with hypoxia or hypercapnia: Secondary | ICD-10-CM | POA: Diagnosis present

## 2013-11-14 DIAGNOSIS — Z8546 Personal history of malignant neoplasm of prostate: Secondary | ICD-10-CM

## 2013-11-14 DIAGNOSIS — J441 Chronic obstructive pulmonary disease with (acute) exacerbation: Principal | ICD-10-CM | POA: Diagnosis present

## 2013-11-14 DIAGNOSIS — I2589 Other forms of chronic ischemic heart disease: Secondary | ICD-10-CM | POA: Diagnosis present

## 2013-11-14 DIAGNOSIS — F4322 Adjustment disorder with anxiety: Secondary | ICD-10-CM | POA: Diagnosis present

## 2013-11-14 DIAGNOSIS — E8809 Other disorders of plasma-protein metabolism, not elsewhere classified: Secondary | ICD-10-CM | POA: Diagnosis present

## 2013-11-14 LAB — CBC WITH DIFFERENTIAL/PLATELET
BASOS ABS: 0 10*3/uL (ref 0.0–0.1)
Basophils Relative: 0 % (ref 0–1)
Eosinophils Absolute: 0 10*3/uL (ref 0.0–0.7)
Eosinophils Relative: 0 % (ref 0–5)
HCT: 38 % — ABNORMAL LOW (ref 39.0–52.0)
Hemoglobin: 12.5 g/dL — ABNORMAL LOW (ref 13.0–17.0)
LYMPHS PCT: 3 % — AB (ref 12–46)
Lymphs Abs: 0.3 10*3/uL — ABNORMAL LOW (ref 0.7–4.0)
MCH: 30.3 pg (ref 26.0–34.0)
MCHC: 32.9 g/dL (ref 30.0–36.0)
MCV: 92.2 fL (ref 78.0–100.0)
MONO ABS: 0.6 10*3/uL (ref 0.1–1.0)
Monocytes Relative: 6 % (ref 3–12)
NEUTROS ABS: 10.2 10*3/uL — AB (ref 1.7–7.7)
Neutrophils Relative %: 91 % — ABNORMAL HIGH (ref 43–77)
PLATELETS: 130 10*3/uL — AB (ref 150–400)
RBC: 4.12 MIL/uL — ABNORMAL LOW (ref 4.22–5.81)
RDW: 15 % (ref 11.5–15.5)
WBC: 11.2 10*3/uL — AB (ref 4.0–10.5)

## 2013-11-14 LAB — TROPONIN I
Troponin I: <0.3 ng/mL (ref <0.30)
Troponin I: <0.3 ng/mL (ref <0.30)
Troponin I: <0.3 ng/mL (ref <0.30)

## 2013-11-14 LAB — BASIC METABOLIC PANEL
BUN: 26 mg/dL — ABNORMAL HIGH (ref 6–23)
CALCIUM: 9.2 mg/dL (ref 8.4–10.5)
CHLORIDE: 101 meq/L (ref 96–112)
CO2: 25 meq/L (ref 19–32)
CREATININE: 1.2 mg/dL (ref 0.50–1.35)
GFR calc non Af Amer: 60 mL/min — ABNORMAL LOW (ref >90)
GFR, EST AFRICAN AMERICAN: 70 mL/min — AB (ref >90)
Glucose, Bld: 124 mg/dL — ABNORMAL HIGH (ref 70–99)
Potassium: 4.6 mEq/L (ref 3.7–5.3)
Sodium: 139 mEq/L (ref 137–147)

## 2013-11-14 LAB — PRO B NATRIURETIC PEPTIDE: Pro B Natriuretic peptide (BNP): 1601 pg/mL — ABNORMAL HIGH (ref 0–125)

## 2013-11-14 MED ORDER — IPRATROPIUM-ALBUTEROL 0.5-2.5 (3) MG/3ML IN SOLN
3.0000 mL | Freq: Once | RESPIRATORY_TRACT | Status: AC
  Administered 2013-11-14: 3 mL via RESPIRATORY_TRACT
  Filled 2013-11-14: qty 3

## 2013-11-14 MED ORDER — LEVOFLOXACIN 500 MG PO TABS
500.0000 mg | ORAL_TABLET | Freq: Every day | ORAL | Status: DC
  Administered 2013-11-15 – 2013-11-17 (×3): 500 mg via ORAL
  Filled 2013-11-14 (×5): qty 1

## 2013-11-14 MED ORDER — SODIUM CHLORIDE 0.9 % IJ SOLN
3.0000 mL | Freq: Two times a day (BID) | INTRAMUSCULAR | Status: DC
  Administered 2013-11-14 – 2013-11-17 (×6): 3 mL via INTRAVENOUS

## 2013-11-14 MED ORDER — ALPRAZOLAM 1 MG PO TABS
1.0000 mg | ORAL_TABLET | Freq: Three times a day (TID) | ORAL | Status: DC | PRN
  Administered 2013-11-14 – 2013-11-18 (×11): 1 mg via ORAL
  Filled 2013-11-14 (×12): qty 1

## 2013-11-14 MED ORDER — ASPIRIN EC 81 MG PO TBEC
81.0000 mg | DELAYED_RELEASE_TABLET | Freq: Every day | ORAL | Status: DC
  Administered 2013-11-15 – 2013-11-16 (×2): 81 mg via ORAL
  Filled 2013-11-14 (×3): qty 1

## 2013-11-14 MED ORDER — MOMETASONE FURO-FORMOTEROL FUM 100-5 MCG/ACT IN AERO
2.0000 | INHALATION_SPRAY | Freq: Two times a day (BID) | RESPIRATORY_TRACT | Status: DC
  Administered 2013-11-14 – 2013-11-17 (×7): 2 via RESPIRATORY_TRACT
  Filled 2013-11-14: qty 8.8

## 2013-11-14 MED ORDER — HYDROCODONE-ACETAMINOPHEN 5-325 MG PO TABS
1.0000 | ORAL_TABLET | ORAL | Status: DC | PRN
  Administered 2013-11-14 – 2013-11-15 (×3): 2 via ORAL
  Filled 2013-11-14 (×3): qty 2

## 2013-11-14 MED ORDER — LEVALBUTEROL HCL 0.63 MG/3ML IN NEBU
0.6300 mg | INHALATION_SOLUTION | Freq: Four times a day (QID) | RESPIRATORY_TRACT | Status: DC
  Administered 2013-11-14 – 2013-11-18 (×14): 0.63 mg via RESPIRATORY_TRACT
  Filled 2013-11-14 (×31): qty 3

## 2013-11-14 MED ORDER — IPRATROPIUM BROMIDE 0.02 % IN SOLN
0.5000 mg | Freq: Four times a day (QID) | RESPIRATORY_TRACT | Status: DC
  Administered 2013-11-14 (×2): 0.5 mg via RESPIRATORY_TRACT
  Filled 2013-11-14 (×2): qty 2.5

## 2013-11-14 MED ORDER — NITROGLYCERIN 0.4 MG SL SUBL
0.4000 mg | SUBLINGUAL_TABLET | SUBLINGUAL | Status: DC | PRN
  Administered 2013-11-14 – 2013-11-16 (×2): 0.4 mg via SUBLINGUAL
  Filled 2013-11-14 (×2): qty 1

## 2013-11-14 MED ORDER — LEVALBUTEROL HCL 0.63 MG/3ML IN NEBU: 0.6300 mg | INHALATION_SOLUTION | Freq: Four times a day (QID) | RESPIRATORY_TRACT | Status: DC | PRN

## 2013-11-14 MED ORDER — IPRATROPIUM BROMIDE 0.02 % IN SOLN
0.5000 mg | Freq: Four times a day (QID) | RESPIRATORY_TRACT | Status: DC
Start: 2013-11-15 — End: 2013-11-18
  Administered 2013-11-15 – 2013-11-18 (×13): 0.5 mg via RESPIRATORY_TRACT
  Filled 2013-11-14 (×14): qty 2.5

## 2013-11-14 MED ORDER — LEVOFLOXACIN 500 MG PO TABS: 750.0000 mg | ORAL_TABLET | Freq: Every day | ORAL | Status: DC

## 2013-11-14 MED ORDER — FUROSEMIDE 10 MG/ML IJ SOLN
40.0000 mg | Freq: Every day | INTRAMUSCULAR | Status: DC
  Administered 2013-11-15: 40 mg via INTRAVENOUS
  Filled 2013-11-14: qty 4

## 2013-11-14 MED ORDER — FUROSEMIDE 10 MG/ML IJ SOLN
40.0000 mg | Freq: Once | INTRAMUSCULAR | Status: AC
  Administered 2013-11-14: 40 mg via INTRAVENOUS
  Filled 2013-11-14: qty 4

## 2013-11-14 MED ORDER — PREDNISONE 50 MG PO TABS
50.0000 mg | ORAL_TABLET | Freq: Every day | ORAL | Status: DC
  Administered 2013-11-15 – 2013-11-16 (×2): 50 mg via ORAL
  Filled 2013-11-14 (×4): qty 1

## 2013-11-14 MED ORDER — NICOTINE 14 MG/24HR TD PT24
14.0000 mg | MEDICATED_PATCH | Freq: Every day | TRANSDERMAL | Status: DC
  Administered 2013-11-14 – 2013-11-17 (×4): 14 mg via TRANSDERMAL
  Filled 2013-11-14 (×5): qty 1

## 2013-11-14 MED ORDER — DOXAZOSIN MESYLATE 8 MG PO TABS
8.0000 mg | ORAL_TABLET | Freq: Every day | ORAL | Status: DC
  Administered 2013-11-14 – 2013-11-17 (×4): 8 mg via ORAL
  Filled 2013-11-14 (×5): qty 1

## 2013-11-14 MED ORDER — IPRATROPIUM-ALBUTEROL 0.5-2.5 (3) MG/3ML IN SOLN
3.0000 mL | Freq: Four times a day (QID) | RESPIRATORY_TRACT | Status: DC | PRN
  Administered 2013-11-18: 3 mL via RESPIRATORY_TRACT
  Filled 2013-11-14: qty 3

## 2013-11-14 MED ORDER — ASPIRIN 81 MG PO CHEW
324.0000 mg | CHEWABLE_TABLET | Freq: Once | ORAL | Status: AC
  Administered 2013-11-14: 243 mg via ORAL
  Filled 2013-11-14: qty 4

## 2013-11-14 MED ORDER — APIXABAN 5 MG PO TABS
5.0000 mg | ORAL_TABLET | Freq: Two times a day (BID) | ORAL | Status: DC
  Administered 2013-11-14 – 2013-11-17 (×7): 5 mg via ORAL
  Filled 2013-11-14 (×9): qty 1

## 2013-11-14 NOTE — ED Notes (Signed)
Pt c/o chest pain that started around 0100 this morning and shob.  Pt states that he was just released from here last week for the same thing and had PNA. Pt states he has COPD.  Chest pain is described as pressure. Pt was on 2L O2 at home, had nebulizer treatment also and O2 was only at 94% at home.  Pt states that he has a lot of swelling in bilat lower extremities and yesterday had fluid weeping from them.

## 2013-11-14 NOTE — Telephone Encounter (Signed)
I called spoke with pt. He is currently in the ED and is going to be admitted. Will forward to MR as an [place]

## 2013-11-14 NOTE — Telephone Encounter (Signed)
Received call from [facility] with [place].She stated patient did not sleep last night.Stated he coughed all night,non productive cough.Stated he has chest tightness.Rates tightness # 8.Stated he felt like he was drowning in fluid,feels like he did when he had pneumonia.Advised to go to [facility] ER.Trish called.

## 2013-11-14 NOTE — ED Provider Notes (Signed)
CSN: [id number]     Arrival date & time 11/14/13  3329 History   First MD Initiated Contact with Patient 11/14/13 [phone]     Chief Complaint  Patient presents with  . Chest Pain  . Shortness of Breath     (Consider location/radiation/quality/duration/timing/severity/associated sxs/prior Treatment) HPI 67 year old male presents with chest pain or shortness of breath that started at 1 AM (9 hours ago). He states she's also been having a productive cough. Denies any fevers or chills. The chest pressure has improved somewhat but is currently a 7/10. He states nothing that seemed to help in the hospital was Dilaudid IV. He has also been noticing bilateral lower extremity swelling and redness with weeping. He states that he thinks they're read because he was out in the sun yesterday. He was discharged from the hospital 5 days ago and states he felt well after diuresis and pain control. At that time his weight was 219 pounds. This morning he was 222 pounds. He's been taking his Lasix as instructed. He feels like this is like pneumonia but also if he has to much fluid. He does feel like he needs a nebulizer at this time. Has not had increased his oxygen that he wears continuously.  Past Medical History  Diagnosis Date  . CAD (coronary artery disease)     a. s/p CABG x 5;  b. 04/2012 Cath: patent LIMA->LAD and VG->OM3, all other grafts occluded.  . Hypertension   . Anxiety   . History of kidney cancer   . Adjustment disorder with anxiety   . Hyperlipidemia   . BPH (benign prostatic hypertrophy)   . COPD (chronic obstructive pulmonary disease)     a. on home O2.  . Arthritis   . Ischemic cardiomyopathy     a. 06/2013 Echo: EF 30-35%, no reg wma's, Gr2 DD, triv AI, mod dil LA, nl RV fxn.  . Chronic systolic CHF (congestive heart failure)     a. 06/2013 Echo: EF 30-35%.  . ADENOCARCINOMA, PROSTATE dx'd 2012  . Renal cancer dx'd 1997    lt nephrectomy   Past Surgical History  Procedure Laterality  Date  . Coronary artery bypass graft      x 5  . Cardiac catheterization    . Nephrectomy      left nephrectomy for ca  . Posterior cervical laminectomy      x 8   limited ROM  and can't lie flat  . Heart stents      x 5  . Robot assisted laparoscopic radical prostatectomy      for prostate cancer  . Cystoscopy with litholapaxy  05/01/2012    Procedure: CYSTOSCOPY WITH LITHOLAPAXY;  Surgeon: Dutch Gray, MD;  Location: [facility] ORS;  Service: Urology;  Laterality: N/A;  . Prostate cancer     Family History  Problem Relation Age of Onset  . Diabetes Mother   . Coronary artery disease    . Heart disease Mother   . Heart disease Father   . Heart disease Brother   . Diabetes Father    History  Substance Use Topics  . Smoking status: Former Smoker -- 0.30 packs/day for 54 years    Types: Cigarettes    Quit date: 03/29/2013  . Smokeless tobacco: Former Systems developer    Quit date: 04/19/2012     Comment: Using e-cig now  . Alcohol Use: No    Review of Systems  Constitutional: Negative for fever and chills.  HENT: Positive for congestion.  Respiratory: Positive for cough and shortness of breath.   Cardiovascular: Positive for chest pain and leg swelling.  Gastrointestinal: Negative for vomiting.  Skin: Positive for color change.  All other systems reviewed and are negative.     Allergies  Ativan; Lisinopril; Morphine and related; and Ibuprofen  Home Medications   Prior to Admission medications   Medication Sig Start Date End Date Taking? Authorizing Provider  albuterol (PROVENTIL HFA;VENTOLIN HFA) 108 (90 BASE) MCG/ACT inhaler Inhale 2 puffs into the lungs every 6 (six) hours as needed for wheezing or shortness of breath. Shortness of breath 11/02/13  Yes Domenic Polite, MD  albuterol (PROVENTIL) (2.5 MG/3ML) 0.083% nebulizer solution Take 2.5 mg by nebulization 4 (four) times daily. 04/06/13  Yes Erick Colace, NP  ALPRAZolam Duanne Moron) 1 MG tablet Take 1 tablet (1 mg total) by mouth  3 (three) times daily as needed for anxiety. 11/09/13  Yes Theodis Blaze, MD  aspirin EC 81 MG tablet Take 81 mg by mouth daily.   Yes Historical Provider, MD  doxazosin (CARDURA) 8 MG tablet Take 8 mg by mouth at bedtime.    Yes Historical Provider, MD  furosemide (LASIX) 40 MG tablet Take 1 tablet (40 mg total) by mouth every evening. 11/09/13  Yes Theodis Blaze, MD  HYDROcodone-acetaminophen (NORCO/VICODIN) 5-325 MG per tablet Take 1-2 tablets by mouth every 4 (four) hours as needed (pain). 11/09/13  Yes Theodis Blaze, MD  HYDROmorphone (DILAUDID) 4 MG tablet Take 1 tablet (4 mg total) by mouth every 4 (four) hours as needed for severe pain (breakthrough pain). 11/09/13  Yes Theodis Blaze, MD  ipratropium (ATROVENT) 0.02 % nebulizer solution Take 0.5 mg by nebulization 4 (four) times daily.   Yes Historical Provider, MD  Ipratropium-Albuterol (COMBIVENT RESPIMAT) 20-100 MCG/ACT AERS respimat Inhale 1 puff into the lungs every 6 (six) hours as needed for wheezing or shortness of breath. 11/02/13  Yes Domenic Polite, MD  mometasone-formoterol (DULERA) 100-5 MCG/ACT AERO Inhale 2 puffs into the lungs 2 (two) times daily. 11/02/13  Yes Domenic Polite, MD  nicotine (NICODERM CQ - DOSED IN MG/24 HOURS) 21 mg/24hr patch Place 21 mg onto the skin daily.   Yes Historical Provider, MD  predniSONE (DELTASONE) 10 MG tablet Take 40mg  for 1 day then 30mg  for 1day then 20mg  for 1day then 10mg  for 1day then STOP 11/09/13  Yes Theodis Blaze, MD  apixaban (ELIQUIS) 5 MG TABS tablet Take 1 tablet (5 mg total) by mouth 2 (two) times daily. 11/09/13   Theodis Blaze, MD  NITROSTAT 0.3 MG SL tablet Place 0.3 mg under the tongue every 5 (five) minutes as needed for chest pain.  06/15/13   Historical Provider, MD   BP 102/84  Pulse 101  Temp(Src) 98.1 F (36.7 C) (Oral)  Resp 18  SpO2 95% Physical Exam  Nursing note and vitals reviewed. Constitutional: He is oriented to person, place, and time. He appears well-developed and  well-nourished.  HENT:  Head: Normocephalic and atraumatic.  Right Ear: External ear normal.  Left Ear: External ear normal.  Nose: Nose normal.  Eyes: Right eye exhibits no discharge. Left eye exhibits no discharge.  Neck: Neck supple.  Cardiovascular: Normal rate, regular rhythm, normal heart sounds and intact distal pulses.   HR right around 100  Pulmonary/Chest: Effort normal. He has wheezes. He has rales. He exhibits no tenderness.  Abdominal: Soft. He exhibits no distension. There is no tenderness.  Musculoskeletal: He exhibits edema.  Right lower leg: He exhibits edema.       Left lower leg: He exhibits edema.       Legs: Neurological: He is alert and oriented to person, place, and time.  Skin: Skin is warm and dry.    ED Course  Procedures (including critical care time) Labs Review Labs Reviewed  CBC WITH DIFFERENTIAL - Abnormal; Notable for the following:    WBC 11.2 (*)    RBC 4.12 (*)    Hemoglobin 12.5 (*)    HCT 38.0 (*)    Platelets 130 (*)    Neutrophils Relative % 91 (*)    Neutro Abs 10.2 (*)    Lymphocytes Relative 3 (*)    Lymphs Abs 0.3 (*)    All other components within normal limits  BASIC METABOLIC PANEL - Abnormal; Notable for the following:    Glucose, Bld 124 (*)    BUN 26 (*)    GFR calc non Af Amer 60 (*)    GFR calc Af Amer 70 (*)    All other components within normal limits  PRO B NATRIURETIC PEPTIDE - Abnormal; Notable for the following:    Pro B Natriuretic peptide (BNP) 1601.0 (*)    All other components within normal limits  TROPONIN I  TROPONIN I  TROPONIN I  TROPONIN I    Imaging Review Dg Chest 2 View  11/14/2013   CLINICAL DATA:  Shortness of breath.  EXAM: CHEST  2 VIEW  COMPARISON:  A single view of the chest 11/05/2013 and 06/30/2013.  FINDINGS: The patient is status post CABG. Heart size is upper normal. The chest is hyperexpanded but the lungs are clear. No pneumothorax or pleural effusion is identified.  IMPRESSION:  Findings compatible with COPD.  No acute disease.   Electronically Signed   By: Inge Rise M.D.   On: 11/14/2013 11:02     EKG Interpretation   Date/Time:  Wednesday Nov 14 2013 09:55:01 EDT Ventricular Rate:  102 PR Interval:  139 QRS Duration: 99 QT Interval:  343 QTC Calculation: 447 R Axis:   81 Text Interpretation:  Sinus tachycardia Atrial premature complexes  Borderline right axis deviation Anteroseptal infarct, old Baseline wander  in lead(s) III aVF No significant change since last tracing Confirmed by  Jamier Urbas  MD, Jahree Dermody ([id number]) on 11/14/2013 9:56:43 AM      MDM   Final diagnoses:  CHF exacerbation  Shortness of breath    Given patient's acute weight gain and re-elevation of his BNP is most likely a CHF exacerbation. Patient's symptoms are also improved somewhat with albuterol but he still feels dyspneic and has tachypnea. Given this, will give IV lasix and re-admit. Seems to be a combination of CHF and COPD at this time. No new PNA. His chest pain is concerning, but troponin is normal and EKG is unchanged. He states he had pain like this for days in hospital and was only relieved with dilaudid. I am less suspicious that this is ACS.    Ephraim Hamburger, MD 11/14/13 [phone]

## 2013-11-14 NOTE — Telephone Encounter (Signed)
New message   Home health in the home now    Patient is feeling tightness in chest, fluid overload, finished antibiotic on Monday. Patient stated he had a bad night last night.  Pain scales  8-9 .   Vital sign 140/90 , 86, 94 %

## 2013-11-14 NOTE — ED Notes (Signed)
Made Respiratory aware of neb treatment ordered.  

## 2013-11-14 NOTE — H&P (Signed)
History and Physical    Lucas [name]:[id number] DOB: [birth year] DOA: 11/14/2013  Referring physician: Dr. Verta Ellen PCP: Salena Saner., MD  Specialists: none  Chief Complaint: shortness of breath  HPI: Lucas [name] is a 68 y.o. male has a past medical history significant for COPD on home O2, chronic systolic heart failure EF ~40%, CAD s/p CABG, anxiety, presents to the ED with chest pain and difficulties breathing. Patient has been recently hospitalized for same and this is his third hospitalization in the past month alone. He was just discharged 5 days ago and felt well initially however last night he started having worsening of cough, which sounds productive however he is unable to expectorate anything. He also is complaining of chest pain, pleuritic in nature and this has been here throughout his past hospitalization. Endorses wheezing and worsening of his dyspnea. He denies abdominal pain, nausea/vomiting/diarrhea. Denies fevers. He endorses bilateral leg swelling and some weeping. In the ED, he has gained about 3 lbs since being home, has elevated BNP and has wheezing, [facility] asked for admission.   Review of Systems: as per HPI otherwise negative  Past Medical History  Diagnosis Date  . CAD (coronary artery disease)     a. s/p CABG x 5;  b. 04/2012 Cath: patent LIMA->LAD and VG->OM3, all other grafts occluded.  . Hypertension   . Anxiety   . History of kidney cancer   . Adjustment disorder with anxiety   . Hyperlipidemia   . BPH (benign prostatic hypertrophy)   . COPD (chronic obstructive pulmonary disease)     a. on home O2.  . Arthritis   . Ischemic cardiomyopathy     a. 06/2013 Echo: EF 30-35%, no reg wma's, Gr2 DD, triv AI, mod dil LA, nl RV fxn.  . Chronic systolic CHF (congestive heart failure)     a. 06/2013 Echo: EF 30-35%.  . ADENOCARCINOMA, PROSTATE dx'd 2012  . Renal cancer dx'd 1997    lt nephrectomy   Past Surgical History  Procedure Laterality  Date  . Coronary artery bypass graft      x 5  . Cardiac catheterization    . Nephrectomy      left nephrectomy for ca  . Posterior cervical laminectomy      x 8   limited ROM  and can't lie flat  . Heart stents      x 5  . Robot assisted laparoscopic radical prostatectomy      for prostate cancer  . Cystoscopy with litholapaxy  05/01/2012    Procedure: CYSTOSCOPY WITH LITHOLAPAXY;  Surgeon: Dutch Gray, MD;  Location: [facility];  Service: Urology;  Laterality: N/A;  . Prostate cancer     Social History:  reports that he quit smoking about 7 months ago. His smoking use included Cigarettes. He has a 16.2 pack-year smoking history. He quit smokeless tobacco use about 18 months ago. He reports that he does not drink alcohol or use illicit drugs.  Allergies  Allergen Reactions  . Ativan [Lorazepam] Other (See Comments)    Increases agitation, tolerates xanax  . Lisinopril Cough  . Morphine And Related     Hallucinations, too sedated when given with ativan  . Ibuprofen Hives, Nausea And Vomiting and Rash    Tolerates baby aspirin per patient.      Family History  Problem Relation Age of Onset  . Diabetes Mother   . Coronary artery disease    . Heart disease Mother   .  Heart disease Father   . Heart disease Brother   . Diabetes Father    Prior to Admission medications   Medication Sig Start Date End Date Taking? Authorizing Provider  albuterol (PROVENTIL HFA;VENTOLIN HFA) 108 (90 BASE) MCG/ACT inhaler Inhale 2 puffs into the lungs every 6 (six) hours as needed for wheezing or shortness of breath. Shortness of breath 11/02/13  Yes Domenic Polite, MD  albuterol (PROVENTIL) (2.5 MG/3ML) 0.083% nebulizer solution Take 2.5 mg by nebulization 4 (four) times daily. 04/06/13  Yes Erick Colace, NP  ALPRAZolam Duanne Moron) 1 MG tablet Take 1 tablet (1 mg total) by mouth 3 (three) times daily as needed for anxiety. 11/09/13  Yes Theodis Blaze, MD  aspirin EC 81 MG tablet Take 81 mg by mouth daily.    Yes Historical Provider, MD  doxazosin (CARDURA) 8 MG tablet Take 8 mg by mouth at bedtime.    Yes Historical Provider, MD  furosemide (LASIX) 40 MG tablet Take 1 tablet (40 mg total) by mouth every evening. 11/09/13  Yes Theodis Blaze, MD  HYDROcodone-acetaminophen (NORCO/VICODIN) 5-325 MG per tablet Take 1-2 tablets by mouth every 4 (four) hours as needed (pain). 11/09/13  Yes Theodis Blaze, MD  HYDROmorphone (DILAUDID) 4 MG tablet Take 1 tablet (4 mg total) by mouth every 4 (four) hours as needed for severe pain (breakthrough pain). 11/09/13  Yes Theodis Blaze, MD  ipratropium (ATROVENT) 0.02 % nebulizer solution Take 0.5 mg by nebulization 4 (four) times daily.   Yes Historical Provider, MD  Ipratropium-Albuterol (COMBIVENT RESPIMAT) 20-100 MCG/ACT AERS respimat Inhale 1 puff into the lungs every 6 (six) hours as needed for wheezing or shortness of breath. 11/02/13  Yes Domenic Polite, MD  mometasone-formoterol (DULERA) 100-5 MCG/ACT AERO Inhale 2 puffs into the lungs 2 (two) times daily. 11/02/13  Yes Domenic Polite, MD  nicotine (NICODERM CQ - DOSED IN MG/24 HOURS) 21 mg/24hr patch Place 21 mg onto the skin daily.   Yes Historical Provider, MD  predniSONE (DELTASONE) 10 MG tablet Take 40mg  for 1 day then 30mg  for 1day then 20mg  for 1day then 10mg  for 1day then STOP 11/09/13  Yes Theodis Blaze, MD  apixaban (ELIQUIS) 5 MG TABS tablet Take 1 tablet (5 mg total) by mouth 2 (two) times daily. 11/09/13   Theodis Blaze, MD  NITROSTAT 0.3 MG SL tablet Place 0.3 mg under the tongue every 5 (five) minutes as needed for chest pain.  06/15/13   Historical Provider, MD   Physical Exam: Filed Vitals:   11/14/13 0956 11/14/13 1044 11/14/13 1128 11/14/13 1355  BP: 102/84  133/71 161/106  Pulse: 101  94 93  Temp: 98.1 F (36.7 C)   98.4 F (36.9 C)  TempSrc: Oral   Oral  Resp: 18  24 24   SpO2: 95% 95% 93% 96%     General:  No apparent distress  Eyes: no scleral icterus  ENT: moist oropharynx  Neck: supple,  no JVD  Cardiovascular: regular rate without MRG; 2+ peripheral pulses  Respiratory: minimal scattered wheezing, distant breath sounds  Abdomen: soft, non tender to palpation, positive bowel sounds, no guarding, no rebound  Skin: no rashes  Musculoskeletal: 1+ peripheral edema, weeping   Psychiatric: normal mood and affect  Neurologic: non focal  Labs on Admission:  Basic Metabolic Panel:  Recent Labs Lab 11/07/13 1828 11/09/13 0328 11/14/13 1038  NA 133* 135* 139  K 4.5 5.0 4.6  CL 92* 93* 101  CO2 25 31  25  GLUCOSE 199* 176* 124*  BUN 54* 59* 26*  CREATININE 1.75* 1.87* 1.20  CALCIUM 9.2 8.9 9.2   CBC:  Recent Labs Lab 11/08/13 0435 11/09/13 0328 11/14/13 1038  WBC 16.4* 15.3* 11.2*  NEUTROABS  --   --  10.2*  HGB 12.7* 12.8* 12.5*  HCT 38.1* 39.4 38.0*  MCV 91.8 92.9 92.2  PLT 125* 121* 130*   Cardiac Enzymes:  Recent Labs Lab 11/14/13 1038  TROPONINI <0.30    BNP (last 3 results)  Recent Labs  11/08/13 0435 11/09/13 0328 11/14/13 1105  PROBNP 766.9* 813.4* 1601.0*   CBG:  Recent Labs Lab 11/08/13 0713 11/08/13 1155 11/08/13 1638 11/08/13 2133 11/09/13 0735  GLUCAP 166* 190* 188* 230* 91    Radiological Exams on Admission: Dg Chest 2 View  11/14/2013   CLINICAL DATA:  Shortness of breath.  EXAM: CHEST  2 VIEW  COMPARISON:  A single view of the chest 11/05/2013 and 06/30/2013.  FINDINGS: The patient is status post CABG. Heart size is upper normal. The chest is hyperexpanded but the lungs are clear. No pneumothorax or pleural effusion is identified.  IMPRESSION: Findings compatible with COPD.  No acute disease.   Electronically Signed   By: Inge Rise M.D.   On: 11/14/2013 11:02    EKG: Independently reviewed.  Assessment/Plan Active Problems:   Adjustment disorder with anxiety   HYPERTENSION   CORONARY ARTERY DISEASE   COPD (chronic obstructive pulmonary disease)   CKD (chronic kidney disease) stage 1, GFR 90 ml/min or  greater   Acute exacerbation of chronic obstructive pulmonary disease (COPD)   Acute on chronic combined systolic and diastolic congestive heart failure, NYHA class 4   Ischemic cardiomyopathy   A-fib   CHF exacerbation   Shortness of breath   Acute and chronic respiratory failure with hypoxia   #1 Acute on chronic respiratory failure - likely multifactorial due to underlying COPD/systolic CHF #2 Acute on chronic COPD with mild exacerbation - may not have fully recovered, still scattered wheezing, continue prednisone po, nebulizer, Levofloxacin. CXR without acute processes.  #3 Acute on chronic systolic heart failure, most recent 2D echo 4/30 with EF 40-45%. Patient with 3lb weight gain, LE edema, received 40 IV Lasix in the ED start 40 scheduled daily.  - strict I&O, daily weights.  #4 chest pain - similar to his previous pain related to his COPD. Monitor on telemetry and cycle CEs.  #5 CAD #5 HTN - continue home medications.  #6 A fib - noted last hospitalization, continue Apixaban. Patient had some problems affording this medication, will consult case management in am. In sinus rhythm now. #7 CKD - at baseline  Diet: Heart healthy Fluids: None DVT Prophylaxis: Eloquis  Code Status: Full  Family Communication: None  Disposition Plan: Inpatient  Time spent: 35  This note has been created with Surveyor, quantity. Any transcriptional errors are unintentional.   Marjo Grosvenor M. Cruzita Lederer, MD [facility] Pager [phone]  If 7PM-7AM, please contact night-coverage [email or web address] Password [email or web address] 11/14/2013, 2:57 PM

## 2013-11-15 ENCOUNTER — Ambulatory Visit: Payer: Medicare Other | Admitting: Cardiovascular Disease

## 2013-11-15 DIAGNOSIS — I5033 Acute on chronic diastolic (congestive) heart failure: Secondary | ICD-10-CM

## 2013-11-15 DIAGNOSIS — I509 Heart failure, unspecified: Secondary | ICD-10-CM

## 2013-11-15 DIAGNOSIS — J9620 Acute and chronic respiratory failure, unspecified whether with hypoxia or hypercapnia: Secondary | ICD-10-CM

## 2013-11-15 DIAGNOSIS — R0902 Hypoxemia: Secondary | ICD-10-CM

## 2013-11-15 LAB — CBC
HCT: 36.9 % — ABNORMAL LOW (ref 39.0–52.0)
Hemoglobin: 11.9 g/dL — ABNORMAL LOW (ref 13.0–17.0)
MCH: 30.3 pg (ref 26.0–34.0)
MCHC: 32.2 g/dL (ref 30.0–36.0)
MCV: 93.9 fL (ref 78.0–100.0)
PLATELETS: 124 10*3/uL — AB (ref 150–400)
RBC: 3.93 MIL/uL — AB (ref 4.22–5.81)
RDW: 15.1 % (ref 11.5–15.5)
WBC: 11.4 10*3/uL — AB (ref 4.0–10.5)

## 2013-11-15 LAB — COMPREHENSIVE METABOLIC PANEL
ALBUMIN: 2.9 g/dL — AB (ref 3.5–5.2)
ALT: 30 U/L (ref 0–53)
AST: 17 U/L (ref 0–37)
Alkaline Phosphatase: 44 U/L (ref 39–117)
BUN: 29 mg/dL — ABNORMAL HIGH (ref 6–23)
CHLORIDE: 99 meq/L (ref 96–112)
CO2: 28 meq/L (ref 19–32)
Calcium: 8.8 mg/dL (ref 8.4–10.5)
Creatinine, Ser: 1.36 mg/dL — ABNORMAL HIGH (ref 0.50–1.35)
GFR calc Af Amer: 60 mL/min — ABNORMAL LOW (ref >90)
GFR, EST NON AFRICAN AMERICAN: 52 mL/min — AB (ref >90)
Glucose, Bld: 120 mg/dL — ABNORMAL HIGH (ref 70–99)
Potassium: 3.9 mEq/L (ref 3.7–5.3)
SODIUM: 138 meq/L (ref 137–147)
Total Bilirubin: 0.7 mg/dL (ref 0.3–1.2)
Total Protein: 6.2 g/dL (ref 6.0–8.3)

## 2013-11-15 MED ORDER — HYDROMORPHONE HCL 2 MG PO TABS
2.0000 mg | ORAL_TABLET | Freq: Two times a day (BID) | ORAL | Status: DC | PRN
  Administered 2013-11-15 (×2): 2 mg via ORAL
  Filled 2013-11-15 (×2): qty 1

## 2013-11-15 MED ORDER — HYDROMORPHONE HCL 2 MG PO TABS
2.0000 mg | ORAL_TABLET | Freq: Once | ORAL | Status: AC
  Administered 2013-11-15: 2 mg via ORAL
  Filled 2013-11-15 (×2): qty 1

## 2013-11-15 MED ORDER — FUROSEMIDE 10 MG/ML IJ SOLN
40.0000 mg | Freq: Two times a day (BID) | INTRAMUSCULAR | Status: DC
  Administered 2013-11-15 – 2013-11-17 (×5): 40 mg via INTRAVENOUS
  Filled 2013-11-15 (×8): qty 4

## 2013-11-15 MED ORDER — POTASSIUM CHLORIDE CRYS ER 20 MEQ PO TBCR
40.0000 meq | EXTENDED_RELEASE_TABLET | Freq: Every day | ORAL | Status: DC
  Administered 2013-11-15 – 2013-11-17 (×3): 40 meq via ORAL
  Filled 2013-11-15 (×4): qty 2

## 2013-11-15 NOTE — Progress Notes (Signed)
[facility] PROGRESS NOTE  Lucas [name] IEP:[id number] DOB: 1946/07/07 DOA: 11/14/2013 PCP: Salena Saner., MD  Assessment/Plan: Acute on Chronic Hypoxemic Respiratory Failure -Suspect largely due to acute CHF, but COPD likely playing a role as well. -See below for details.  Acute on Chronic Diastolic CHF -Patient is known to be non-compliant, and has in fact been discharged from [facility] services because of this. -This is his third CHF readmission in 30 days. -He states he is complaint with lasix, has not been weighing himself at home. -Has massive LE edema. -Continue lasix IV, will increase to BID to increase diuresis. -He is 1 L negative since admission. -Watch renal function carefully with increased diuresis. -Cards consult has been requested.  COPD with Acute Exacerbation -No wheezing on exam today. -Continue steroid titration, nebs.  A Fib -Rate controlled. -Continue apixaban.  Code Status: Full COde Family Communication: Patient only  Disposition Plan: Home when ready   Consultants:  Cardiology   Antibiotics:  Levaquin   Subjective: Feels anxious. Wants his dilaudid restarted.  Objective: Filed Vitals:   11/14/13 2231 11/15/13 0450 11/15/13 0746 11/15/13 1306  BP: 114/93 119/70  100/67  Pulse: 64 61  87  Temp: 98.1 F (36.7 C) 98.3 F (36.8 C)  98 F (36.7 C)  TempSrc: Oral Oral  Oral  Resp: 22 22  20   Height:      Weight:  99.156 kg (218 lb 9.6 oz)    SpO2: 95% 96% 91% 95%    Intake/Output Summary (Last 24 hours) at 11/15/13 1355 Last data filed at 11/15/13 1308  Gross per 24 hour  Intake   1680 ml  Output   3450 ml  Net  -1770 ml   Filed Weights   11/14/13 1620 11/15/13 0450  Weight: 99.474 kg (219 lb 4.8 oz) 99.156 kg (218 lb 9.6 oz)    Exam:   General:  AA Ox3  Cardiovascular: RRR  Respiratory: CTA B  Abdomen: obese/S/NT/ND/+BS  Extremities: 3+ pitting edema up to knees bilaterally.  Neurologic:   Non-focal  Data Reviewed: Basic Metabolic Panel:  Recent Labs Lab 11/09/13 0328 11/14/13 1038 11/15/13 0335  NA 135* 139 138  K 5.0 4.6 3.9  CL 93* 101 99  CO2 31 25 28   GLUCOSE 176* 124* 120*  BUN 59* 26* 29*  CREATININE 1.87* 1.20 1.36*  CALCIUM 8.9 9.2 8.8   Liver Function Tests:  Recent Labs Lab 11/15/13 0335  AST 17  ALT 30  ALKPHOS 44  BILITOT 0.7  PROT 6.2  ALBUMIN 2.9*   No results found for this basename: LIPASE, AMYLASE,  in the last 168 hours No results found for this basename: AMMONIA,  in the last 168 hours CBC:  Recent Labs Lab 11/09/13 0328 11/14/13 1038 11/15/13 0335  WBC 15.3* 11.2* 11.4*  NEUTROABS  --  10.2*  --   HGB 12.8* 12.5* 11.9*  HCT 39.4 38.0* 36.9*  MCV 92.9 92.2 93.9  PLT 121* 130* 124*   Cardiac Enzymes:  Recent Labs Lab 11/14/13 1038 11/14/13 1500 11/14/13 2046  TROPONINI <0.30 <0.30 <0.30   BNP (last 3 results)  Recent Labs  11/08/13 0435 11/09/13 0328 11/14/13 1105  PROBNP 766.9* 813.4* 1601.0*   CBG:  Recent Labs Lab 11/08/13 1638 11/08/13 2133 11/09/13 0735  GLUCAP 188* 230* 91    No results found for this or any previous visit (from the past 240 hour(s)).   Studies: Dg Chest 2 View  11/14/2013   CLINICAL DATA:  Shortness of breath.  EXAM: CHEST  2 VIEW  COMPARISON:  A single view of the chest 11/05/2013 and 06/30/2013.  FINDINGS: The patient is status post CABG. Heart size is upper normal. The chest is hyperexpanded but the lungs are clear. No pneumothorax or pleural effusion is identified.  IMPRESSION: Findings compatible with COPD.  No acute disease.   Electronically Signed   By: Inge Rise M.D.   On: 11/14/2013 11:02    Scheduled Meds: . apixaban  5 mg Oral BID  . aspirin EC  81 mg Oral Daily  . doxazosin  8 mg Oral QHS  . furosemide  40 mg Intravenous Daily  . ipratropium  0.5 mg Nebulization Q6H  . levalbuterol  0.63 mg Nebulization Q6H  . levofloxacin  500 mg Oral Daily  .  mometasone-formoterol  2 puff Inhalation BID  . nicotine  14 mg Transdermal Daily  . predniSONE  50 mg Oral Q breakfast  . sodium chloride  3 mL Intravenous Q12H   Continuous Infusions:   Principal Problem:   Acute and chronic respiratory failure with hypoxia Active Problems:   Acute on chronic diastolic CHF (congestive heart failure)   Adjustment disorder with anxiety   HYPERTENSION   CORONARY ARTERY DISEASE   COPD (chronic obstructive pulmonary disease)   CKD (chronic kidney disease) stage 1, GFR 90 ml/min or greater   Acute exacerbation of chronic obstructive pulmonary disease (COPD)   Acute on chronic combined systolic and diastolic congestive heart failure, NYHA class 4   Ischemic cardiomyopathy   A-fib   Shortness of breath    Time spent: 35 minutes. Greater than 50% of this time was spent in direct contact with the patient coordinating care.    [facility] Pager [phone]  If 7PM-7AM, please contact night-coverage at [email or web address], password [facility] 11/15/2013, 1:55 PM  LOS: 1 day

## 2013-11-15 NOTE — Consult Note (Signed)
Reason for Consult: CHF Referring Physician:   [name] is an 68 y.o. male.  HPI:   The patient is a 68 yo male with a history of CAD with past CABG x 5, HTN, anxiety, renal cell carcinoma, CKD,  prostate cancer, HLD - apparently not able to take statins, COPD on home oxygen, OA, PAD and chronic systolic HF. Most recent echo reports EF 40 to 45%.  LAst cath was Feb 2015: His LIMA to the LAD is patent and the SVG to [facility] but all other SVGs remain occluded.   Right heart cath at the same time revealed normal pressures.  He was just discharged again on 11/09/13 with acute on chronic resp failure from CHF and COPD.  He returns to the hospital with orthopnea, SOB, chest tightness fairly consistently, cough and congestion.  His weight has been going up  and the LEE is getting worse.  His weight at discharge on the 1st was 225 and now is 218#.  BNP is 1601.  Up from 813.4.  The patient currently denies nausea, vomiting, fever, dizziness, abdominal pain, hematochezia, melena.  He continues to have severe right claudication and is eager to have bypass surgery.  He is scheduled to see Dr. Trula Slade on June 1.      Past Medical History  Diagnosis Date  . CAD (coronary artery disease)     a. s/p CABG x 5;  b. 04/2012 Cath: patent LIMA->LAD and VG->OM3, all other grafts occluded.  . Hypertension   . Anxiety   . History of kidney cancer   . Adjustment disorder with anxiety   . Hyperlipidemia   . BPH (benign prostatic hypertrophy)   . COPD (chronic obstructive pulmonary disease)     a. on home O2.  . Arthritis   . Ischemic cardiomyopathy     a. 06/2013 Echo: EF 30-35%, no reg wma's, Gr2 DD, triv AI, mod dil LA, nl RV fxn.  . Chronic systolic CHF (congestive heart failure)     a. 06/2013 Echo: EF 30-35%.  . ADENOCARCINOMA, PROSTATE dx'd 2012  . Renal cancer dx'd 1997    lt nephrectomy    Past Surgical History  Procedure Laterality Date  . Coronary artery bypass graft      x 5  . Cardiac  catheterization    . Nephrectomy      left nephrectomy for ca  . Posterior cervical laminectomy      x 8   limited ROM  and can't lie flat  . Heart stents      x 5  . Robot assisted laparoscopic radical prostatectomy      for prostate cancer  . Cystoscopy with litholapaxy  05/01/2012    Procedure: CYSTOSCOPY WITH LITHOLAPAXY;  Surgeon: Dutch Gray, MD;  Location: [facility];  Service: Urology;  Laterality: N/A;  . Prostate cancer      Family History  Problem Relation Age of Onset  . Diabetes Mother   . Coronary artery disease    . Heart disease Mother   . Heart disease Father   . Heart disease Brother   . Diabetes Father     Social History:  reports that he quit smoking about 7 months ago. His smoking use included Cigarettes. He has a 16.2 pack-year smoking history. He quit smokeless tobacco use about 18 months ago. He reports that he does not drink alcohol or use illicit drugs.  Allergies:  Allergies  Allergen Reactions  . Ativan [Lorazepam] Other (See  Comments)    Increases agitation, tolerates xanax  . Lisinopril Cough  . Morphine And Related     Hallucinations, too sedated when given with ativan  . Ibuprofen Hives, Nausea And Vomiting and Rash    Tolerates baby aspirin per patient.      Medications:  Scheduled Meds: . apixaban  5 mg Oral BID  . aspirin EC  81 mg Oral Daily  . doxazosin  8 mg Oral QHS  . furosemide  40 mg Intravenous Q12H  . ipratropium  0.5 mg Nebulization Q6H  . levalbuterol  0.63 mg Nebulization Q6H  . levofloxacin  500 mg Oral Daily  . mometasone-formoterol  2 puff Inhalation BID  . nicotine  14 mg Transdermal Daily  . predniSONE  50 mg Oral Q breakfast  . sodium chloride  3 mL Intravenous Q12H   Continuous Infusions:  PRN Meds:.ALPRAZolam, HYDROmorphone, ipratropium-albuterol, nitroGLYCERIN   Results for orders placed during the hospital encounter of 11/14/13 (from the past 48 hour(s))  CBC WITH DIFFERENTIAL     Status: Abnormal    Collection Time    11/14/13 10:38 AM      Result Value Ref Range   WBC 11.2 (*) 4.0 - 10.5 K/uL   RBC 4.12 (*) 4.22 - 5.81 MIL/uL   Hemoglobin 12.5 (*) 13.0 - 17.0 g/dL   HCT 38.0 (*) 39.0 - 52.0 %   MCV 92.2  78.0 - 100.0 fL   MCH 30.3  26.0 - 34.0 pg   MCHC 32.9  30.0 - 36.0 g/dL   RDW 15.0  11.5 - 15.5 %   Platelets 130 (*) 150 - 400 K/uL   Neutrophils Relative % 91 (*) 43 - 77 %   Neutro Abs 10.2 (*) 1.7 - 7.7 K/uL   Lymphocytes Relative 3 (*) 12 - 46 %   Lymphs Abs 0.3 (*) 0.7 - 4.0 K/uL   Monocytes Relative 6  3 - 12 %   Monocytes Absolute 0.6  0.1 - 1.0 K/uL   Eosinophils Relative 0  0 - 5 %   Eosinophils Absolute 0.0  0.0 - 0.7 K/uL   Basophils Relative 0  0 - 1 %   Basophils Absolute 0.0  0.0 - 0.1 K/uL  BASIC METABOLIC PANEL     Status: Abnormal   Collection Time    11/14/13 10:38 AM      Result Value Ref Range   Sodium 139  137 - 147 mEq/L   Potassium 4.6  3.7 - 5.3 mEq/L   Chloride 101  96 - 112 mEq/L   CO2 25  19 - 32 mEq/L   Glucose, Bld 124 (*) 70 - 99 mg/dL   BUN 26 (*) 6 - 23 mg/dL   Creatinine, Ser 1.20  0.50 - 1.35 mg/dL   Calcium 9.2  8.4 - 10.5 mg/dL   GFR calc non Af Amer 60 (*) >90 mL/min   GFR calc Af Amer 70 (*) >90 mL/min   Comment: (NOTE)     The eGFR has been calculated using the CKD EPI equation.     This calculation has not been validated in all clinical situations.     eGFR's persistently <90 mL/min signify possible Chronic Kidney     Disease.  TROPONIN I     Status: None   Collection Time    11/14/13 10:38 AM      Result Value Ref Range   Troponin I <0.30  <0.30 ng/mL   Comment:  Due to the release kinetics of cTnI,     a negative result within the first hours     of the onset of symptoms does not rule out     myocardial infarction with certainty.     If myocardial infarction is still suspected,     repeat the test at appropriate intervals.  PRO B NATRIURETIC PEPTIDE     Status: Abnormal   Collection Time    11/14/13  11:05 AM      Result Value Ref Range   Pro B Natriuretic peptide (BNP) 1601.0 (*) 0 - 125 pg/mL  TROPONIN I     Status: None   Collection Time    11/14/13  3:00 PM      Result Value Ref Range   Troponin I <0.30  <0.30 ng/mL   Comment:            Due to the release kinetics of cTnI,     a negative result within the first hours     of the onset of symptoms does not rule out     myocardial infarction with certainty.     If myocardial infarction is still suspected,     repeat the test at appropriate intervals.  TROPONIN I     Status: None   Collection Time    11/14/13  8:46 PM      Result Value Ref Range   Troponin I <0.30  <0.30 ng/mL   Comment:            Due to the release kinetics of cTnI,     a negative result within the first hours     of the onset of symptoms does not rule out     myocardial infarction with certainty.     If myocardial infarction is still suspected,     repeat the test at appropriate intervals.  CBC     Status: Abnormal   Collection Time    11/15/13  3:35 AM      Result Value Ref Range   WBC 11.4 (*) 4.0 - 10.5 K/uL   RBC 3.93 (*) 4.22 - 5.81 MIL/uL   Hemoglobin 11.9 (*) 13.0 - 17.0 g/dL   HCT 36.9 (*) 39.0 - 52.0 %   MCV 93.9  78.0 - 100.0 fL   MCH 30.3  26.0 - 34.0 pg   MCHC 32.2  30.0 - 36.0 g/dL   RDW 15.1  11.5 - 15.5 %   Platelets 124 (*) 150 - 400 K/uL  COMPREHENSIVE METABOLIC PANEL     Status: Abnormal   Collection Time    11/15/13  3:35 AM      Result Value Ref Range   Sodium 138  137 - 147 mEq/L   Potassium 3.9  3.7 - 5.3 mEq/L   Chloride 99  96 - 112 mEq/L   CO2 28  19 - 32 mEq/L   Glucose, Bld 120 (*) 70 - 99 mg/dL   BUN 29 (*) 6 - 23 mg/dL   Creatinine, Ser 1.36 (*) 0.50 - 1.35 mg/dL   Calcium 8.8  8.4 - 10.5 mg/dL   Total Protein 6.2  6.0 - 8.3 g/dL   Albumin 2.9 (*) 3.5 - 5.2 g/dL   AST 17  0 - 37 U/L   ALT 30  0 - 53 U/L   Alkaline Phosphatase 44  39 - 117 U/L   Total Bilirubin 0.7  0.3 - 1.2 mg/dL   GFR calc non Af Lucas [name]  52  (*) >90 mL/min   GFR calc Af Amer 60 (*) >90 mL/min   Comment: (NOTE)     The eGFR has been calculated using the CKD EPI equation.     This calculation has not been validated in all clinical situations.     eGFR's persistently <90 mL/min signify possible Chronic Kidney     Disease.    Dg Chest 2 View  11/14/2013   CLINICAL DATA:  Shortness of breath.  EXAM: CHEST  2 VIEW  COMPARISON:  A single view of the chest 11/05/2013 and 06/30/2013.  FINDINGS: The patient is status post CABG. Heart size is upper normal. The chest is hyperexpanded but the lungs are clear. No pneumothorax or pleural effusion is identified.  IMPRESSION: Findings compatible with COPD.  No acute disease.   Electronically Signed   By: Inge Rise M.D.   On: 11/14/2013 11:02    Review of Systems  Constitutional: Negative for fever and diaphoresis.  HENT: Positive for congestion. Negative for sore throat.   Respiratory: Positive for cough, shortness of breath and wheezing.   Cardiovascular: Positive for chest pain ("tightness"), orthopnea, claudication (Right), leg swelling and PND.  Gastrointestinal: Negative for nausea, vomiting, abdominal pain, blood in stool and melena.  Genitourinary: Negative for hematuria.  Musculoskeletal: Positive for neck pain (Chronic).  Neurological: Negative for dizziness.  All other systems reviewed and are negative.  Blood pressure 100/67, pulse 87, temperature 98 F (36.7 C), temperature source Oral, resp. rate 20, height 5' 8"  (1.727 m), weight 218 lb 9.6 oz (99.156 kg), SpO2 95.00%. Physical Exam  Constitutional: He is oriented to person, place, and time. No distress.  Obese   HENT:  Head: Normocephalic and atraumatic.  Eyes: EOM are normal. Pupils are equal, round, and reactive to light. No scleral icterus.  Neck: Normal range of motion. Neck supple.  Cardiovascular: Normal rate, regular rhythm, S1 normal and S2 normal.   Pulses:      Radial pulses are 2+ on the right side, and  2+ on the left side.       Dorsalis pedis pulses are 1+ on the right side, and 1+ on the left side.  No carotid bruit  Respiratory: Effort normal. He has wheezes. He has no rales.  GI: Soft. Bowel sounds are normal. He exhibits distension. There is no tenderness.  Musculoskeletal: He exhibits edema (2+ LEE bilaterally).  Neurological: He is alert and oriented to person, place, and time. He exhibits normal muscle tone.  Skin: Skin is warm and dry.  Psychiatric: He has a normal mood and affect.    Assessment/Plan: Principal Problem:   Acute and chronic respiratory failure with hypoxia  Secondary to CHF and COPD Active Problems:    Acute on chronic combined systolic and diastolic congestive heart failure, NYHA class 4 Continue aggressive diuresis.  He is -2L thus far but he has ~15# to go.   Dry weight is probably around 200-205#.  Monitor BP and K+.  K+ is trending down.  Will add 40 PO daily.  Hypoalbuminemia is likely contributing a little to abdominal distention.      HYPERTENSION  BP on the soft side.  No Ace or ARB for this and CKD.      A-fib  Maintaining NSR.  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  Acute exacerbation of chronic obstructive pulmonary disease (COPD)  Getting Q6hr Xopenex and other pulm meds.     Ischemic cardiomyopathy      Tarri Fuller 11/15/2013, 2:44 PM    Attending Note:   The patient was seen and examined.  Agree with assessment and plan as noted above.  Changes made to the above note as needed.  Mr. Greenawalt clearly has volume overload.   He had normal R heart pressures at the time of cath in Feb. , 2015.  He clearly needs some additional diruesis.  I suspect he still has ~15 lbs of extra  fluid onboard. I also think he need some additional pulmonary tune up.     Thayer Headings, Brooke Bonito., MD, [facility] 11/15/2013, 4:40  PM

## 2013-11-15 NOTE — Progress Notes (Signed)
Received referral from inpatient RNCM for [facility]. Patient has been active up until this most recent admission. Per [facility] Coordinator patient was discharged for noncompliance regarding taking his medications and not following up appropriately and as advised with the Primary Care MD. [facility] Coordinator notified patient Primary Care MD of [facility] services discharge. Made inpatient RNCM aware.  [facility] [phone]

## 2013-11-15 NOTE — Plan of Care (Signed)
Problem: ICU Phase Progression Outcomes Goal: Flu/PneumoVaccines if indicated Outcome: Not Applicable Date Met:  85/48/83 Pt admitted recently  Problem: Phase I Progression Outcomes Goal: Flu/PneumoVaccines if indicated Outcome: Not Applicable Date Met:  01/41/59 Recent admission

## 2013-11-15 NOTE — Progress Notes (Signed)
[facility]  Patient Status: Active (receiving services up to time of hospitalization)  [facility] is providing the following services: RN  If patient discharges after hours, please call [phone].   Lurlean Leyden 11/15/2013, 12:15 PM

## 2013-11-15 NOTE — Progress Notes (Signed)
Pt is active with [facility] to continue at discharge with [facility]

## 2013-11-16 DIAGNOSIS — Z9119 Patient's noncompliance with other medical treatment and regimen: Secondary | ICD-10-CM

## 2013-11-16 DIAGNOSIS — I4891 Unspecified atrial fibrillation: Secondary | ICD-10-CM

## 2013-11-16 DIAGNOSIS — J441 Chronic obstructive pulmonary disease with (acute) exacerbation: Principal | ICD-10-CM

## 2013-11-16 DIAGNOSIS — Z91199 Patient's noncompliance with other medical treatment and regimen due to unspecified reason: Secondary | ICD-10-CM

## 2013-11-16 DIAGNOSIS — I5043 Acute on chronic combined systolic (congestive) and diastolic (congestive) heart failure: Secondary | ICD-10-CM

## 2013-11-16 DIAGNOSIS — E669 Obesity, unspecified: Secondary | ICD-10-CM

## 2013-11-16 LAB — BASIC METABOLIC PANEL
BUN: 34 mg/dL — AB (ref 6–23)
CHLORIDE: 95 meq/L — AB (ref 96–112)
CO2: 28 mEq/L (ref 19–32)
Calcium: 8.8 mg/dL (ref 8.4–10.5)
Creatinine, Ser: 1.42 mg/dL — ABNORMAL HIGH (ref 0.50–1.35)
GFR calc non Af Amer: 49 mL/min — ABNORMAL LOW (ref >90)
GFR, EST AFRICAN AMERICAN: 57 mL/min — AB (ref >90)
GLUCOSE: 110 mg/dL — AB (ref 70–99)
Potassium: 4.1 mEq/L (ref 3.7–5.3)
Sodium: 136 mEq/L — ABNORMAL LOW (ref 137–147)

## 2013-11-16 MED ORDER — HYDROMORPHONE HCL 4 MG PO TABS
4.0000 mg | ORAL_TABLET | Freq: Four times a day (QID) | ORAL | Status: DC | PRN
Start: 2013-11-16 — End: 2013-11-16
  Administered 2013-11-16 (×2): 4 mg via ORAL
  Filled 2013-11-16 (×4): qty 1

## 2013-11-16 MED ORDER — PREDNISONE 20 MG PO TABS
40.0000 mg | ORAL_TABLET | Freq: Every day | ORAL | Status: DC
  Administered 2013-11-17: 40 mg via ORAL
  Filled 2013-11-16 (×2): qty 2

## 2013-11-16 MED ORDER — HYDROMORPHONE HCL 4 MG PO TABS
4.0000 mg | ORAL_TABLET | ORAL | Status: DC | PRN
  Administered 2013-11-16 – 2013-11-18 (×11): 4 mg via ORAL
  Filled 2013-11-16 (×11): qty 1

## 2013-11-16 NOTE — Progress Notes (Signed)
[facility] PROGRESS NOTE  PETER KEYWORTH PJK:[id number] DOB: Aug 20, 1945 DOA: 11/14/2013 PCP: Salena Saner., MD  Assessment/Plan: Acute on Chronic Hypoxemic Respiratory Failure -Suspect largely due to acute CHF, but COPD likely playing a role as well. -See below for details.  Acute on Chronic Diastolic CHF -Patient is known to be non-compliant, and has in fact been discharged from [facility] services because of this. -This is his third CHF readmission in 30 days. -He states he is complaint with lasix, has not been weighing himself at home. -Has massive LE edema. -Continue lasix IV BID. -He is 3.5 L negative since admission; still requires more diuresis. -Watch renal function carefully with increased diuresis. -Appreciate cards input.  COPD with Acute Exacerbation -No wheezing on exam today. -Continue steroid titration, nebs.  A Fib -Rate controlled. -Continue apixaban.  Code Status: Full COde Family Communication: Patient only  Disposition Plan: Home when ready   Consultants:  Cardiology   Antibiotics:  Levaquin   Subjective: Feels anxious. Wants his dilaudid dose increased.  Objective: Filed Vitals:   11/16/13 0416 11/16/13 0929 11/16/13 1000 11/16/13 1357  BP: 143/87  125/72 111/66  Pulse: 62  84 74  Temp: 97.8 F (36.6 C)  97.5 F (36.4 C) 98.2 F (36.8 C)  TempSrc: Oral  Oral Oral  Resp: 20  20 20   Height:      Weight: 99.5 kg (219 lb 5.7 oz)     SpO2: 98% 97% 95% 94%    Intake/Output Summary (Last 24 hours) at 11/16/13 1559 Last data filed at 11/16/13 1528  Gross per 24 hour  Intake   1920 ml  Output   3675 ml  Net  -1755 ml   Filed Weights   11/14/13 1620 11/15/13 0450 11/16/13 0416  Weight: 99.474 kg (219 lb 4.8 oz) 99.156 kg (218 lb 9.6 oz) 99.5 kg (219 lb 5.7 oz)    Exam:   General:  AA Ox3  Cardiovascular: RRR  Respiratory: CTA B  Abdomen: obese/S/NT/ND/+BS  Extremities: 3+ pitting edema up to knees  bilaterally.  Neurologic:  Non-focal  Data Reviewed: Basic Metabolic Panel:  Recent Labs Lab 11/14/13 1038 11/15/13 0335 11/16/13 0410  NA 139 138 136*  K 4.6 3.9 4.1  CL 101 99 95*  CO2 25 28 28   GLUCOSE 124* 120* 110*  BUN 26* 29* 34*  CREATININE 1.20 1.36* 1.42*  CALCIUM 9.2 8.8 8.8   Liver Function Tests:  Recent Labs Lab 11/15/13 0335  AST 17  ALT 30  ALKPHOS 44  BILITOT 0.7  PROT 6.2  ALBUMIN 2.9*   No results found for this basename: LIPASE, AMYLASE,  in the last 168 hours No results found for this basename: AMMONIA,  in the last 168 hours CBC:  Recent Labs Lab 11/14/13 1038 11/15/13 0335  WBC 11.2* 11.4*  NEUTROABS 10.2*  --   HGB 12.5* 11.9*  HCT 38.0* 36.9*  MCV 92.2 93.9  PLT 130* 124*   Cardiac Enzymes:  Recent Labs Lab 11/14/13 1038 11/14/13 1500 11/14/13 2046  TROPONINI <0.30 <0.30 <0.30   BNP (last 3 results)  Recent Labs  11/08/13 0435 11/09/13 0328 11/14/13 1105  PROBNP 766.9* 813.4* 1601.0*   CBG: No results found for this basename: GLUCAP,  in the last 168 hours  No results found for this or any previous visit (from the past 240 hour(s)).   Studies: No results found.  Scheduled Meds: . apixaban  5 mg Oral BID  . aspirin EC  81  mg Oral Daily  . doxazosin  8 mg Oral QHS  . furosemide  40 mg Intravenous Q12H  . ipratropium  0.5 mg Nebulization Q6H  . levalbuterol  0.63 mg Nebulization Q6H  . levofloxacin  500 mg Oral Daily  . mometasone-formoterol  2 puff Inhalation BID  . nicotine  14 mg Transdermal Daily  . potassium chloride  40 mEq Oral Daily  . predniSONE  50 mg Oral Q breakfast  . sodium chloride  3 mL Intravenous Q12H   Continuous Infusions:   Principal Problem:   Acute and chronic respiratory failure with hypoxia Active Problems:   Acute on chronic diastolic CHF (congestive heart failure)   Adjustment disorder with anxiety   HYPERTENSION   CORONARY ARTERY DISEASE   COPD (chronic obstructive  pulmonary disease)   CKD (chronic kidney disease) stage 1, GFR 90 ml/min or greater   Acute exacerbation of chronic obstructive pulmonary disease (COPD)   Acute on chronic combined systolic and diastolic congestive heart failure, NYHA class 4   Ischemic cardiomyopathy   A-fib   Shortness of breath    Time spent: 35 minutes. Greater than 50% of this time was spent in direct contact with the patient coordinating care.    [facility] Pager [phone]  If 7PM-7AM, please contact night-coverage at [email or web address], password [facility] 11/16/2013, 3:59 PM  LOS: 2 days

## 2013-11-16 NOTE — Progress Notes (Signed)
I have reviewed all documentation from student nurse. 

## 2013-11-16 NOTE — Progress Notes (Signed)
Patient was compliant with taking medications with a little talking and calming down after becoming anxious and aggrevated. Patient likes his medications to be given a certain way with water, applesauce or chocolate ice cream depending on the medication. Patient stated he feels he is taking too much Lasix and that he had already received it, however it seems that patient is quiet confused about the medications he has been given. The patient has ambulated well and has been walking the halls throughout the morning. He is voiding well with the Lasix. Lacretia Leigh, SN, [facility]

## 2013-11-16 NOTE — Progress Notes (Signed)
Pt began c/o of CP that he was unsure if it was related to his coughing. BP was in 130s and patient was given 1 nitro for 8/10 chest pain. Pain decreased to 4/10 and patient refused more Nitro. BP stayed in 130s with Nitro. Pt's only complaint is leg pain and hospitalist prescribed dilaudid PRN. Pt states he is more comfortable now. Pt had 7 beat run of Vtach. Hospitalist notified. Will continue to monitor.

## 2013-11-16 NOTE — Progress Notes (Signed)
Pt did not sleep well last night due to several bouts of severe pain. Pt still complaining of pain in the right leg that is throbbing. Pt states that the increased pain medicine has dulled the pain, but refuses to put his feet up in order to decrease the swelling. RN educated patient that the patient's feet should be up, but patient states that his feet being up makes it harder to sit up and that makes it harder to breathe. Will continue to monitor.

## 2013-11-17 LAB — BASIC METABOLIC PANEL
BUN: 38 mg/dL — AB (ref 6–23)
CALCIUM: 8.8 mg/dL (ref 8.4–10.5)
CO2: 29 meq/L (ref 19–32)
CREATININE: 1.45 mg/dL — AB (ref 0.50–1.35)
Chloride: 96 mEq/L (ref 96–112)
GFR calc Af Amer: 56 mL/min — ABNORMAL LOW (ref >90)
GFR calc non Af Amer: 48 mL/min — ABNORMAL LOW (ref >90)
Glucose, Bld: 110 mg/dL — ABNORMAL HIGH (ref 70–99)
Potassium: 4.2 mEq/L (ref 3.7–5.3)
Sodium: 138 mEq/L (ref 137–147)

## 2013-11-17 MED ORDER — PREDNISONE 20 MG PO TABS
30.0000 mg | ORAL_TABLET | Freq: Every day | ORAL | Status: DC
  Administered 2013-11-18: 30 mg via ORAL
  Filled 2013-11-17 (×2): qty 1

## 2013-11-17 NOTE — Progress Notes (Signed)
    Subjective:  Main complaint is right leg pain. Breathing is improved. No CP.   Objective:  Vital Signs in the last 24 hours: 143/87, 20, 62, 97.8  Intake/Output from previous day:  Wt 219. Out -2L  Physical Exam: General: Well developed, well nourished, in no acute distress. Head:  Normocephalic and atraumatic. Thick neck, ?JVD Lungs: Mild wheeze B. Heart: Normal S1 and S2.  No murmur, rubs or gallops.  Abdomen: Obese, soft, non-tender, positive bowel sounds. Extremities: No clubbing or cyanosis. 2-3+ BLE edema. Neurologic: Alert and oriented x 3. Pleasant    Lab Results:  Recent Labs  11/14/13 1038 11/15/13 0335  WBC 11.2* 11.4*  HGB 12.5* 11.9*  PLT 130* 124*    Recent Labs  11/14/13 1500 11/14/13 2046  TROPONINI <0.30 <0.30   Hepatic Function Panel  Recent Labs  11/15/13 0335  PROT 6.2  ALBUMIN 2.9*  AST 17  ALT 30  ALKPHOS 44  BILITOT 0.7   EKG:  Sinus tachy 103, PRWP, NSSTW changes. Personally viewed.  Cardiac Studies:  ECHO 11/08/13 - EF 40%, hypokinesis of the mid-distalanteroseptal myocardium   Assessment/Plan:  Principal Problem:   Acute and chronic respiratory failure with hypoxia Active Problems:   Adjustment disorder with anxiety   HYPERTENSION   CORONARY ARTERY DISEASE   COPD (chronic obstructive pulmonary disease)   CKD (chronic kidney disease) stage 1, GFR 90 ml/min or greater   Acute exacerbation of chronic obstructive pulmonary disease (COPD)   Acute on chronic combined systolic and diastolic congestive heart failure, NYHA class 4   Ischemic cardiomyopathy   A-fib   Acute on chronic diastolic CHF (congestive heart failure)   Shortness of breath  68 year old with history of non compliance (See [facility] note) with the following:  1) Acute on chronic diastolic/systolic HF (FW26%)  - Continue with lasix 40IV BID  - Breathing improved but would continue diuresis  - Creat 1.42, mildly increased, monitor  - Discussed fluid  restriction with him (stated no one has ever told him this before). Salt restriction.   - Once euvolemic, consider addition of bisoprolol (COPD).  2) COPD  - Per primary team, playing a significant role as well in SOB.   3) Obesity  - discussed weight loss  4) AFIB - PAF  - Eliquis  - Will stop ASA  - I have reviewed several ECGs back to 08/22/13 all of which have been sinus or Stach with PAC's. One ECG is erroneously labeled AFIB. He likely has had other episodes of AFIB in the past based on past history documented.   5) Non compliance   - recent multiple admits Network engineer)  [facility] care management note as follows: "Per [facility] Coordinator patient was discharged for noncompliance regarding taking his medications and not following up appropriately and as advised with the Primary Care MD. [place] Coordinator notified patient Primary Care MD of [facility] services discharge. Made inpatient RNCM aware.  [facility] [phone]"     Candee Furbish, MD

## 2013-11-17 NOTE — Progress Notes (Signed)
[facility] PROGRESS NOTE  HEIDI LEMAY TIR:[id number] DOB: February 14, 1946 DOA: 11/14/2013 PCP: Salena Saner., MD  Assessment/Plan: Acute on Chronic Hypoxemic Respiratory Failure -Suspect largely due to acute CHF, but COPD likely playing a role as well. -See below for details.  Acute on Chronic Diastolic CHF -Patient is known to be non-compliant, and has in fact been discharged from [facility] services because of this. -This is his third CHF readmission in 30 days. -He states he is complaint with lasix, has not been weighing himself at home. -Has massive LE edema. -Continue lasix IV BID. -He is 3.6 L negative since admission; still requires more diuresis. -Watch renal function carefully with increased diuresis. -Appreciate cards input. -He is very anxious and is wanting to leave AMA. I believe I have convinced him to stay at least an extra 24 hours.  COPD with Acute Exacerbation -No wheezing on exam today. -Continue steroid titration, nebs.  A Fib -Rate controlled. -Continue apixaban.  Code Status: Full Code Family Communication: Patient only  Disposition Plan: Home when ready   Consultants:  Cardiology   Antibiotics:  Levaquin   Subjective: Feels anxious. Wants his dilaudid dose increased.  Objective: Filed Vitals:   11/16/13 1357 11/16/13 2107 11/17/13 0545 11/17/13 0825  BP: 111/66 167/72 178/74   Pulse: 74 80 58   Temp: 98.2 F (36.8 C) 97.8 F (36.6 C) 97.6 F (36.4 C)   TempSrc: Oral Oral Oral   Resp: 20 20 20    Height:      Weight:   99.6 kg (219 lb 9.3 oz)   SpO2: 94% 96% 98% 97%    Intake/Output Summary (Last 24 hours) at 11/17/13 1324 Last data filed at 11/17/13 0900  Gross per 24 hour  Intake    960 ml  Output   1400 ml  Net   -440 ml   Filed Weights   11/15/13 0450 11/16/13 0416 11/17/13 0545  Weight: 99.156 kg (218 lb 9.6 oz) 99.5 kg (219 lb 5.7 oz) 99.6 kg (219 lb 9.3 oz)    Exam:   General:  AA  Ox3  Cardiovascular: RRR  Respiratory: CTA B  Abdomen: obese/S/NT/ND/+BS  Extremities: 3+ pitting edema up to knees bilaterally.  Neurologic:  Non-focal  Data Reviewed: Basic Metabolic Panel:  Recent Labs Lab 11/14/13 1038 11/15/13 0335 11/16/13 0410 11/17/13 0457  NA 139 138 136* 138  K 4.6 3.9 4.1 4.2  CL 101 99 95* 96  CO2 25 28 28 29   GLUCOSE 124* 120* 110* 110*  BUN 26* 29* 34* 38*  CREATININE 1.20 1.36* 1.42* 1.45*  CALCIUM 9.2 8.8 8.8 8.8   Liver Function Tests:  Recent Labs Lab 11/15/13 0335  AST 17  ALT 30  ALKPHOS 44  BILITOT 0.7  PROT 6.2  ALBUMIN 2.9*   No results found for this basename: LIPASE, AMYLASE,  in the last 168 hours No results found for this basename: AMMONIA,  in the last 168 hours CBC:  Recent Labs Lab 11/14/13 1038 11/15/13 0335  WBC 11.2* 11.4*  NEUTROABS 10.2*  --   HGB 12.5* 11.9*  HCT 38.0* 36.9*  MCV 92.2 93.9  PLT 130* 124*   Cardiac Enzymes:  Recent Labs Lab 11/14/13 1038 11/14/13 1500 11/14/13 2046  TROPONINI <0.30 <0.30 <0.30   BNP (last 3 results)  Recent Labs  11/08/13 0435 11/09/13 0328 11/14/13 1105  PROBNP 766.9* 813.4* 1601.0*   CBG: No results found for this basename: GLUCAP,  in the last 168 hours  No results found for this or any previous visit (from the past 240 hour(s)).   Studies: No results found.  Scheduled Meds: . apixaban  5 mg Oral BID  . doxazosin  8 mg Oral QHS  . furosemide  40 mg Intravenous Q12H  . ipratropium  0.5 mg Nebulization Q6H  . levalbuterol  0.63 mg Nebulization Q6H  . levofloxacin  500 mg Oral Daily  . mometasone-formoterol  2 puff Inhalation BID  . nicotine  14 mg Transdermal Daily  . potassium chloride  40 mEq Oral Daily  . [START ON 11/18/2013] predniSONE  30 mg Oral Q breakfast  . sodium chloride  3 mL Intravenous Q12H   Continuous Infusions:   Principal Problem:   Acute and chronic respiratory failure with hypoxia Active Problems:   Acute on  chronic diastolic CHF (congestive heart failure)   Adjustment disorder with anxiety   HYPERTENSION   CORONARY ARTERY DISEASE   COPD (chronic obstructive pulmonary disease)   CKD (chronic kidney disease) stage 1, GFR 90 ml/min or greater   Acute exacerbation of chronic obstructive pulmonary disease (COPD)   Acute on chronic combined systolic and diastolic congestive heart failure, NYHA class 4   Ischemic cardiomyopathy   A-fib   Shortness of breath    Time spent: 35 minutes. Greater than 50% of this time was spent in direct contact with the patient coordinating care.    [facility] Pager [phone]  If 7PM-7AM, please contact night-coverage at [email or web address], password [facility] 11/17/2013, 1:24 PM  LOS: 3 days

## 2013-11-17 NOTE — Progress Notes (Signed)
Pt had mostly uneventful night. Pt had 1 occurrence of incontinence while he slept for an hour. Pt had recently received Lasix. Pt was cleaned up and he returned to bed. Pt awake for most of it due to pain and anxiety. Pt receiving dilaudid q4h and xanax q8h. Pt educated as to why he cannot have xanax more often. Pt ambulated through hallways to occupy time. Pt used portable O2 tank.

## 2013-11-17 NOTE — Progress Notes (Signed)
Patient ID: Lucas [name], male   DOB: Nov 22, 1945, 68 y.o.   MRN: [id number]  See Dr. Elmarie Shiley cardiology consult note from last evening. Pt is diuresing.  Daryel November, MD

## 2013-11-18 LAB — BASIC METABOLIC PANEL
BUN: 37 mg/dL — AB (ref 6–23)
CALCIUM: 8.9 mg/dL (ref 8.4–10.5)
CO2: 32 meq/L (ref 19–32)
CREATININE: 1.66 mg/dL — AB (ref 0.50–1.35)
Chloride: 98 mEq/L (ref 96–112)
GFR calc Af Amer: 47 mL/min — ABNORMAL LOW (ref >90)
GFR, EST NON AFRICAN AMERICAN: 41 mL/min — AB (ref >90)
GLUCOSE: 89 mg/dL (ref 70–99)
Potassium: 3.9 mEq/L (ref 3.7–5.3)
SODIUM: 141 meq/L (ref 137–147)

## 2013-11-18 MED ORDER — POTASSIUM CHLORIDE CRYS ER 20 MEQ PO TBCR: 40.0000 meq | EXTENDED_RELEASE_TABLET | Freq: Every day | ORAL | Status: DC

## 2013-11-18 MED ORDER — ASSURE LANCETS MISC: 1.0000 [IU] | Freq: Two times a day (BID) | Status: DC

## 2013-11-18 MED ORDER — PREDNISONE (PAK) 10 MG PO TABS: ORAL_TABLET | Freq: Every day | ORAL | Status: DC

## 2013-11-18 MED ORDER — FUROSEMIDE 40 MG PO TABS: 40.0000 mg | ORAL_TABLET | Freq: Three times a day (TID) | ORAL | Status: DC

## 2013-11-18 NOTE — Discharge Summary (Signed)
Physician Discharge Summary  Lucas [name] ZOX:[id number] DOB: [birth year] DOA: 11/14/2013  PCP: Salena Saner., MD  Admit date: 11/14/2013 Discharge date: 11/18/2013  Time spent: 45 minutes  Recommendations for Outpatient Follow-up:  -Will be discharged home today. -Has appointment with the heart failure clinic on 5/13. -Patient has been educated on diet and medication compliance.   Discharge Diagnoses:  Principal Problem:   Acute and chronic respiratory failure with hypoxia Active Problems:   Acute on chronic diastolic CHF (congestive heart failure)   Adjustment disorder with anxiety   HYPERTENSION   CORONARY ARTERY DISEASE   COPD (chronic obstructive pulmonary disease)   CKD (chronic kidney disease) stage 1, GFR 90 ml/min or greater   Acute exacerbation of chronic obstructive pulmonary disease (COPD)   Acute on chronic combined systolic and diastolic congestive heart failure, NYHA class 4   Ischemic cardiomyopathy   A-fib   Shortness of breath   Discharge Condition: Stable and improved  Filed Weights   11/16/13 0416 11/17/13 0545 11/18/13 0532  Weight: 99.5 kg (219 lb 5.7 oz) 99.6 kg (219 lb 9.3 oz) 99.5 kg (219 lb 5.7 oz)    History of present illness:  Lucas [name] is a 68 y.o. male has a past medical history significant for COPD on home O2, chronic systolic heart failure EF ~40%, CAD s/p CABG, anxiety, presents to the ED with chest pain and difficulties breathing. Patient has been recently hospitalized for same and this is his third hospitalization in the past month alone. He was just discharged 5 days ago and felt well initially however last night he started having worsening of cough, which sounds productive however he is unable to expectorate anything. He also is complaining of chest pain, pleuritic in nature and this has been here throughout his past hospitalization. Endorses wheezing and worsening of his dyspnea. He denies abdominal pain,  nausea/vomiting/diarrhea. Denies fevers. He endorses bilateral leg swelling and some weeping. In the ED, he has gained about 3 lbs since being home, has elevated BNP and has wheezing, [facility] asked for admission.    Hospital Course:   Acute on Chronic Hypoxemic Respiratory Failure  -Suspect largely due to acute CHF, but COPD likely playing a role as well.  -See below for details.   Acute on Chronic Diastolic CHF  -Patient is known to be non-compliant, and has in fact been discharged from [facility] services because of this.  -This is his third CHF readmission in 30 days.  -He states he is complaint with lasix, has not been weighing himself at home.  -Continue lasix IV BID.  -He is 5.6 L negative since admission. -Watch renal function carefully with increased diuresis.  -Appreciate cards input.  -Cards has recommended he DC on lasix 40 TID, until seen at the heart failure clinic.  COPD with Acute Exacerbation  -No wheezing on exam today.  -Continue steroid titration, nebs.   A Fib  -Rate controlled.  -Continue apixaban.   Procedures:  None   Consultations:  Cardiology, Dr. Ron Parker  Discharge Instructions  Discharge Orders   Future Appointments Provider Department Dept Phone   11/21/2013 10:30 AM Burtis Junes, NP [place] [facility] [phone]   11/30/2013 9:00 AM Melvenia Needles, NP [facility] [phone]   12/10/2013 1:00 PM Serafina Mitchell, MD [facility] [phone]   Future Orders Complete By Expires   Diet - low sodium heart healthy  As directed    Discontinue IV  As directed    Increase activity slowly  As directed        Medication List    STOP taking these medications       HYDROcodone-acetaminophen 5-325 MG per tablet  Commonly known as:  NORCO/VICODIN     predniSONE 10 MG tablet  Commonly known as:  DELTASONE  Replaced by:  predniSONE 10 MG tablet      TAKE these medications       albuterol (2.5  MG/3ML) 0.083% nebulizer solution  Commonly known as:  PROVENTIL  Take 2.5 mg by nebulization 4 (four) times daily.     albuterol 108 (90 BASE) MCG/ACT inhaler  Commonly known as:  PROVENTIL HFA;VENTOLIN HFA  Inhale 2 puffs into the lungs every 6 (six) hours as needed for wheezing or shortness of breath. Shortness of breath     ALPRAZolam 1 MG tablet  Commonly known as:  XANAX  Take 1 tablet (1 mg total) by mouth 3 (three) times daily as needed for anxiety.     apixaban 5 MG Tabs tablet  Commonly known as:  ELIQUIS  Take 1 tablet (5 mg total) by mouth 2 (two) times daily.     aspirin EC 81 MG tablet  Take 81 mg by mouth daily.     ASSURE LANCETS Misc  1 Units by Does not apply route 2 (two) times daily.     doxazosin 8 MG tablet  Commonly known as:  CARDURA  Take 8 mg by mouth at bedtime.     furosemide 40 MG tablet  Commonly known as:  LASIX  Take 1 tablet (40 mg total) by mouth 3 (three) times daily.     HYDROmorphone 4 MG tablet  Commonly known as:  DILAUDID  Take 1 tablet (4 mg total) by mouth every 4 (four) hours as needed for severe pain (breakthrough pain).     ipratropium 0.02 % nebulizer solution  Commonly known as:  ATROVENT  Take 0.5 mg by nebulization 4 (four) times daily.     Ipratropium-Albuterol 20-100 MCG/ACT Aers respimat  Commonly known as:  COMBIVENT RESPIMAT  Inhale 1 puff into the lungs every 6 (six) hours as needed for wheezing or shortness of breath.     mometasone-formoterol 100-5 MCG/ACT Aero  Commonly known as:  DULERA  Inhale 2 puffs into the lungs 2 (two) times daily.     nicotine 21 mg/24hr patch  Commonly known as:  NICODERM CQ - dosed in mg/24 hours  Place 21 mg onto the skin daily.     NITROSTAT 0.3 MG SL tablet  Generic drug:  nitroGLYCERIN  Place 0.3 mg under the tongue every 5 (five) minutes as needed for chest pain.     potassium chloride SA 20 MEQ tablet  Commonly known as:  K-DUR,KLOR-CON  Take 2 tablets (40 mEq total) by  mouth daily.     predniSONE 10 MG tablet  Commonly known as:  STERAPRED UNI-PAK  Take by mouth daily. Take 6 tablets today and then decrease by 1 tablet daily until none are left       Allergies  Allergen Reactions  . Ativan [Lorazepam] Other (See Comments)    Increases agitation, tolerates xanax  . Lisinopril Cough  . Morphine And Related     Hallucinations, too sedated when given with ativan  . Ibuprofen Hives, Nausea And Vomiting and Rash    Tolerates baby aspirin per patient.         Follow-up Information   Follow up  with Truitt Merle, NP On 11/21/2013. (At 10:30 AM for CHF / hospital follow-up)    Specialty:  Nurse Practitioner   Contact information:   [place] [place] [address] [phone]        The results of significant diagnostics from this hospitalization (including imaging, microbiology, ancillary and laboratory) are listed below for reference.    Significant Diagnostic Studies: Dg Chest 2 View  11/14/2013   CLINICAL DATA:  Shortness of breath.  EXAM: CHEST  2 VIEW  COMPARISON:  A single view of the chest 11/05/2013 and 06/30/2013.  FINDINGS: The patient is status post CABG. Heart size is upper normal. The chest is hyperexpanded but the lungs are clear. No pneumothorax or pleural effusion is identified.  IMPRESSION: Findings compatible with COPD.  No acute disease.   Electronically Signed   By: Inge Rise M.D.   On: 11/14/2013 11:02   Dg Chest Port 1 View  11/05/2013   CLINICAL DATA:  Shortness of breath and chest pain  EXAM: PORTABLE CHEST - 1 VIEW  COMPARISON:  October 28, 2013  FINDINGS: There is no edema or consolidation. Heart is borderline enlarged with normal pulmonary vascularity. No adenopathy. No pneumothorax. Patient is status post coronary artery bypass grafting.  IMPRESSION: Heart borderline enlarged.  No edema or consolidation.   Electronically Signed   By: Lowella Grip M.D.   On: 11/05/2013 12:25   Dg Chest Port 1  View  10/28/2013   CLINICAL DATA:  Shortness of breath.  Cough.  COPD.  EXAM: PORTABLE CHEST - 1 VIEW  COMPARISON:  DG CHEST 1V PORT dated 10/16/2013  FINDINGS: Indistinct pulmonary vasculature. Heart size within normal limits. Prior CABG.  No overt airspace opacity. Suspected subsegmental atelectasis along the right hemidiaphragm.  IMPRESSION: 1. Faint interstitial accentuation with indistinct pulmonary vasculature. No overt cardiomegaly although otherwise the appearance resembles pulmonary venous hypertension and borderline interstitial edema. Atypical pneumonia or drug reaction might be differential diagnostic considerations. 2. Subsegmental atelectasis at the right lung base.   Electronically Signed   By: Sherryl Barters M.D.   On: 10/28/2013 19:47    Microbiology: No results found for this or any previous visit (from the past 240 hour(s)).   Labs: Basic Metabolic Panel:  Recent Labs Lab 11/14/13 1038 11/15/13 0335 11/16/13 0410 11/17/13 0457 11/18/13 0532  NA 139 138 136* 138 141  K 4.6 3.9 4.1 4.2 3.9  CL 101 99 95* 96 98  CO2 25 28 28 29  32  GLUCOSE 124* 120* 110* 110* 89  BUN 26* 29* 34* 38* 37*  CREATININE 1.20 1.36* 1.42* 1.45* 1.66*  CALCIUM 9.2 8.8 8.8 8.8 8.9   Liver Function Tests:  Recent Labs Lab 11/15/13 0335  AST 17  ALT 30  ALKPHOS 44  BILITOT 0.7  PROT 6.2  ALBUMIN 2.9*   No results found for this basename: LIPASE, AMYLASE,  in the last 168 hours No results found for this basename: AMMONIA,  in the last 168 hours CBC:  Recent Labs Lab 11/14/13 1038 11/15/13 0335  WBC 11.2* 11.4*  NEUTROABS 10.2*  --   HGB 12.5* 11.9*  HCT 38.0* 36.9*  MCV 92.2 93.9  PLT 130* 124*   Cardiac Enzymes:  Recent Labs Lab 11/14/13 1038 11/14/13 1500 11/14/13 2046  TROPONINI <0.30 <0.30 <0.30   BNP: BNP (last 3 results)  Recent Labs  11/08/13 0435 11/09/13 0328 11/14/13 1105  PROBNP 766.9* 813.4* 1601.0*   CBG: No results found for this  basename:  GLUCAP,  in the last 168 hours     Signed:  Erline Hau  [facility] Pager: [phone] 11/18/2013, 3:04 PM

## 2013-11-18 NOTE — Progress Notes (Signed)
CARE MANAGEMENT NOTE 11/18/2013  Patient:  Lucas [name], Lucas [name]   Account Number:  [id number]  Date Initiated:  11/15/2013  Documentation initiated by:  Gabriel Earing  Subjective/Objective Assessment:   Pt admitted with cco SOB, hx of COPD     Action/Plan:   from home with [facility]   Anticipated DC Date:  11/19/2013   Anticipated DC Plan:  [place]  CM consult      [facility]  Resumption Of Svcs/PTA Provider   Choice offered to / List presented to:  C-1 Patient        [place] arranged  HH-1 RN  [place].   Status of service:  Completed, signed off Medicare Important Message given?  NA - LOS <3 / Initial given by admissions (If response is "NO", the following Medicare IM given date fields will be blank) Date Medicare IM given:   Date Additional Medicare IM given:    Discharge Disposition:  [place]  Per UR Regulation:  Reviewed for med. necessity/level of care/duration of stay  If discussed at [place] of Stay Meetings, dates discussed:    Comments:  11/18/2013 1440 NCM notified [facility] of dc home with resumption of care for [facility] RN, PT. Jonnie Finner RN CCM Case Mgmt phone [phone]  11/15/13 MMcGibboney, RN, BSN Chart reviewed.

## 2013-11-18 NOTE — Progress Notes (Signed)
Educated patient on CHF and COPD home care. Printed educational sheets for him to take home as well as a diet plan. Pt taught back information and explained that he did not want to come back to the hospital. Pt reminded on scheduled appointments and medication administration.

## 2013-11-18 NOTE — Progress Notes (Signed)
Patient ID: Lucas [name], male   DOB: 1946-01-26, 68 y.o.   MRN: [id number]    SUBJECTIVE: The patient is feeling better. He has had continued diuresis. He is insistent on going home today.   Filed Vitals:   11/17/13 2002 11/17/13 2059 11/18/13 0143 11/18/13 0532  BP:  140/86  139/85  Pulse:  82  86  Temp:  98 F (36.7 C)  98.1 F (36.7 C)  TempSrc:  Oral  Oral  Resp:  18  18  Height:      Weight:    219 lb 5.7 oz (99.5 kg)  SpO2: 94% 98% 93% 98%     Intake/Output Summary (Last 24 hours) at 11/18/13 0708 Last data filed at 11/18/13 0500  Gross per 24 hour  Intake   1680 ml  Output   3500 ml  Net  -1820 ml    LABS: Basic Metabolic Panel:  Recent Labs  11/17/13 0457 11/18/13 0532  NA 138 141  K 4.2 3.9  CL 96 98  CO2 29 32  GLUCOSE 110* 89  BUN 38* 37*  CREATININE 1.45* 1.66*  CALCIUM 8.8 8.9   Liver Function Tests: No results found for this basename: AST, ALT, ALKPHOS, BILITOT, PROT, ALBUMIN,  in the last 72 hours No results found for this basename: LIPASE, AMYLASE,  in the last 72 hours CBC: No results found for this basename: WBC, NEUTROABS, HGB, HCT, MCV, PLT,  in the last 72 hours Cardiac Enzymes: No results found for this basename: CKTOTAL, CKMB, CKMBINDEX, TROPONINI,  in the last 72 hours BNP: No components found with this basename: POCBNP,  D-Dimer: No results found for this basename: DDIMER,  in the last 72 hours Hemoglobin A1C: No results found for this basename: HGBA1C,  in the last 72 hours Fasting Lipid Panel: No results found for this basename: CHOL, HDL, LDLCALC, TRIG, CHOLHDL, LDLDIRECT,  in the last 72 hours Thyroid Function Tests: No results found for this basename: TSH, T4TOTAL, FREET3, T3FREE, THYROIDAB,  in the last 72 hours  RADIOLOGY: Dg Chest 2 View  11/14/2013   CLINICAL DATA:  Shortness of breath.  EXAM: CHEST  2 VIEW  COMPARISON:  A single view of the chest 11/05/2013 and 06/30/2013.  FINDINGS: The patient is status post  CABG. Heart size is upper normal. The chest is hyperexpanded but the lungs are clear. No pneumothorax or pleural effusion is identified.  IMPRESSION: Findings compatible with COPD.  No acute disease.   Electronically Signed   By: Inge Rise M.D.   On: 11/14/2013 11:02   Dg Chest Port 1 View  11/05/2013   CLINICAL DATA:  Shortness of breath and chest pain  EXAM: PORTABLE CHEST - 1 VIEW  COMPARISON:  October 28, 2013  FINDINGS: There is no edema or consolidation. Heart is borderline enlarged with normal pulmonary vascularity. No adenopathy. No pneumothorax. Patient is status post coronary artery bypass grafting.  IMPRESSION: Heart borderline enlarged.  No edema or consolidation.   Electronically Signed   By: Lowella Grip M.D.   On: 11/05/2013 12:25   Dg Chest Port 1 View  10/28/2013   CLINICAL DATA:  Shortness of breath.  Cough.  COPD.  EXAM: PORTABLE CHEST - 1 VIEW  COMPARISON:  DG CHEST 1V PORT dated 10/16/2013  FINDINGS: Indistinct pulmonary vasculature. Heart size within normal limits. Prior CABG.  No overt airspace opacity. Suspected subsegmental atelectasis along the right hemidiaphragm.  IMPRESSION: 1. Faint interstitial accentuation with indistinct pulmonary vasculature. No overt  cardiomegaly although otherwise the appearance resembles pulmonary venous hypertension and borderline interstitial edema. Atypical pneumonia or drug reaction might be differential diagnostic considerations. 2. Subsegmental atelectasis at the right lung base.   Electronically Signed   By: Sherryl Barters M.D.   On: 10/28/2013 19:47    PHYSICAL EXAM  patient is oriented to person time and place. Affect is normal. He is wearing his portable O2. Lungs reveal scattered rhonchi. He still has 1+ peripheral edema.   TELEMETRY: I have reviewed telemetry today Nov 18, 2013. There is normal sinus rhythm.   ASSESSMENT AND PLAN:    Acute on chronic combined systolic and diastolic congestive heart failure, NYHA class 4      The patient is improved. He is insistent on going home. It is very difficult to know how long to keep a patient like him in the hospital. Most importantly, he needs early post hospital followup. He needs careful attention to his salt and fluid intake. He says he does not eat much salt. I had a careful discussion with him about also limiting his total fluid including limiting his water intake. I explained to him that he must take nor his thirst as he is fluid overloaded even when he is thirsty. He seemed to understand this concept. Our team will contact him to arrange for a post hospital followup of CHF to be done during this coming week. His primary team will be here this morning to help discharge him.  I would send him home on Lasix 40 by mouth 3 times a day. I would stress and stress again the importance of limiting his salt and of limiting his total fluid intake including water. As I said to him, less is better.   Carlena Bjornstad 11/18/2013 7:08 AM

## 2013-11-18 NOTE — Plan of Care (Signed)
Problem: Discharge Progression Outcomes Goal: Dyspnea controlled Patient able to have periods of exertion and periods of rest to conserve energy.

## 2013-11-18 NOTE — Progress Notes (Signed)
Patient continues to ambulate in hallway with oxygen. Patient also sits on side of bed to drink decaf coffee.  Bilateral legs have +1 edema. Legs are also pink at the ankles.  Will continue to monitor the patient.

## 2013-11-21 ENCOUNTER — Encounter: Payer: Self-pay | Admitting: *Deleted

## 2013-11-21 ENCOUNTER — Ambulatory Visit (INDEPENDENT_AMBULATORY_CARE_PROVIDER_SITE_OTHER): Payer: PRIVATE HEALTH INSURANCE | Admitting: Nurse Practitioner

## 2013-11-21 VITALS — BP 140/80 | HR 92 | Ht 68.5 in | Wt 216.4 lb

## 2013-11-21 DIAGNOSIS — I4891 Unspecified atrial fibrillation: Secondary | ICD-10-CM

## 2013-11-21 DIAGNOSIS — I480 Paroxysmal atrial fibrillation: Secondary | ICD-10-CM

## 2013-11-21 DIAGNOSIS — J441 Chronic obstructive pulmonary disease with (acute) exacerbation: Secondary | ICD-10-CM

## 2013-11-21 DIAGNOSIS — I739 Peripheral vascular disease, unspecified: Secondary | ICD-10-CM

## 2013-11-21 DIAGNOSIS — R0600 Dyspnea, unspecified: Secondary | ICD-10-CM

## 2013-11-21 DIAGNOSIS — I5022 Chronic systolic (congestive) heart failure: Secondary | ICD-10-CM

## 2013-11-21 DIAGNOSIS — R0989 Other specified symptoms and signs involving the circulatory and respiratory systems: Secondary | ICD-10-CM

## 2013-11-21 DIAGNOSIS — R0609 Other forms of dyspnea: Secondary | ICD-10-CM

## 2013-11-21 LAB — CBC
HCT: 36.6 % — ABNORMAL LOW (ref 39.0–52.0)
Hemoglobin: 12.5 g/dL — ABNORMAL LOW (ref 13.0–17.0)
MCHC: 34.2 g/dL (ref 30.0–36.0)
MCV: 90.8 fl (ref 78.0–100.0)
Platelets: 203 10*3/uL (ref 150.0–400.0)
RBC: 4.03 Mil/uL — ABNORMAL LOW (ref 4.22–5.81)
RDW: 15.5 % (ref 11.5–15.5)
WBC: 10.4 10*3/uL (ref 4.0–10.5)

## 2013-11-21 LAB — BASIC METABOLIC PANEL
BUN: 34 mg/dL — ABNORMAL HIGH (ref 6–23)
CO2: 29 mEq/L (ref 19–32)
Calcium: 9.6 mg/dL (ref 8.4–10.5)
Chloride: 104 mEq/L (ref 96–112)
Creatinine, Ser: 1.4 mg/dL (ref 0.4–1.5)
GFR: 52.2 mL/min — ABNORMAL LOW (ref >60.00)
Glucose, Bld: 144 mg/dL — ABNORMAL HIGH (ref 70–99)
Potassium: 4.3 mEq/L (ref 3.5–5.1)
Sodium: 141 mEq/L (ref 135–145)

## 2013-11-21 LAB — BRAIN NATRIURETIC PEPTIDE: Pro B Natriuretic peptide (BNP): 226 pg/mL — ABNORMAL HIGH (ref 0.0–100.0)

## 2013-11-21 MED ORDER — CEPHALEXIN 250 MG PO CAPS: 250.0000 mg | ORAL_CAPSULE | Freq: Three times a day (TID) | ORAL | Status: DC

## 2013-11-21 NOTE — Patient Instructions (Signed)
We will check labs today  I am sending in a prescription for Keflex 250 mg to take 3 times a day for a week - this is to help the redness and possible cellulitis in your legs  We will check an EKG today.  See Dr. Johnsie Cancel in early June  Stay on your other medicines  Keep limiting your salt  Keep weighing each day  Call the [place] office at [phone] if you have any questions, problems or concerns.

## 2013-11-21 NOTE — Progress Notes (Addendum)
Lucas [name] Date of Birth: [birth year] Medical Record #[id number]  History of Present Illness: Lucas [name] is seen back today for a one month check. Seen for Dr. Burt Knack. Seen for Dr. Burt Knack but he is a patient of Dr. Kyla Balzarine. He has known CAD with past CABG x 5, HTN, anxiety, renal cell carcinoma, prostate cancer, HLD - apparently not able to take statins, COPD on home oxygen, OA, PAD and chronic systolic HF. EF 40 to 45%. His LIMA to the LAD is patent and the SVG to [facility] but all other SVGs remain occluded.   Referred back earlier this year for cardiac cath and lower extremity arteriogram - to continue with medical management. Sent home but returned to the ER the following day following a syncopal spell - no injury. Had received multiple doses of Versed for anxiety - this was felt to be a contributing factor. Negative CT of the abdomen noted.   I have seen him back since December a couple of times for BP checks. Quite limited by his claudication. Wanting to get referred to [facility]. Dr. Burt Knack did refer him but felt that he was at added risk due to his lungs/other medical issues. Has had recurrent exacerbations of his COPD. Sees Dr. Trula Slade in June. Now on Eliquis for PAF. Has had frequent admissions for his HF/COPD exacerbation.   Most recently in the hospital with orthopnea, weight gain - elevated BNP and respiratory failure with hypoxia. BP was soft while admitted and no ACE or ARB due to this and CKD - but now on Diovan. He was prescribed Eliquis - cannot afford and thus not taking. Remained in sinus.   Comes in today. Here alone. He is not taking the Eliquis - can't afford. Does not want to take coumadin or other NOAC. Taking Lasix BID is what it sounds like he is doing. Weight down at home. Legs are red and sl warm. No fever. Still cough. Breathing may be a little better but probably not - sees pulmonary later this month. Still with considerable claudication of his right leg - wants it fixed so  he can be more active. Says he does not care if "he dies on the table" - he wants something done so he can be more active. Now on chronic narcotics for his claudication. No chest pain. Sounds like he is limiting his salt. No palpitations reported.   Current Outpatient Prescriptions  Medication Sig Dispense Refill  . albuterol (PROVENTIL HFA;VENTOLIN HFA) 108 (90 BASE) MCG/ACT inhaler Inhale 2 puffs into the lungs every 6 (six) hours as needed for wheezing or shortness of breath. Shortness of breath  1 Inhaler  1  . albuterol (PROVENTIL) (2.5 MG/3ML) 0.083% nebulizer solution Take 2.5 mg by nebulization 4 (four) times daily.      Marland Kitchen ALPRAZolam (XANAX) 1 MG tablet Take 1 tablet (1 mg total) by mouth 3 (three) times daily as needed for anxiety.  30 tablet  1  . apixaban (ELIQUIS) 5 MG TABS tablet Take 1 tablet (5 mg total) by mouth 2 (two) times daily.  60 tablet  1  . aspirin EC 81 MG tablet Take 81 mg by mouth daily.      Marilynne Drivers LANCETS MISC 1 Units by Does not apply route 2 (two) times daily.  100 each  0  . doxazosin (CARDURA) 8 MG tablet Take 8 mg by mouth at bedtime.       . furosemide (LASIX) 40 MG tablet Take 1 tablet (40 mg  total) by mouth 3 (three) times daily.  30 tablet  1  . HYDROmorphone (DILAUDID) 4 MG tablet Take 1 tablet (4 mg total) by mouth every 4 (four) hours as needed for severe pain (breakthrough pain).  90 tablet  0  . ipratropium (ATROVENT) 0.02 % nebulizer solution Take 0.5 mg by nebulization 4 (four) times daily.      . Ipratropium-Albuterol (COMBIVENT RESPIMAT) 20-100 MCG/ACT AERS respimat Inhale 1 puff into the lungs every 6 (six) hours as needed for wheezing or shortness of breath.  1 Inhaler  1  . mometasone-formoterol (DULERA) 100-5 MCG/ACT AERO Inhale 2 puffs into the lungs 2 (two) times daily.  1 Inhaler  0  . NITROSTAT 0.3 MG SL tablet Place 0.3 mg under the tongue every 5 (five) minutes as needed for chest pain.       . NON FORMULARY Place 2 L into the nose at  bedtime. O2 at night only      . potassium chloride SA (K-DUR,KLOR-CON) 20 MEQ tablet Take 2 tablets (40 mEq total) by mouth daily.  15 tablet  0  . predniSONE (STERAPRED UNI-PAK) 10 MG tablet Take by mouth daily. Take 6 tablets today and then decrease by 1 tablet daily until none are left  21 tablet  0  . valsartan (DIOVAN) 40 MG tablet Take 40 mg by mouth daily.        No current facility-administered medications for this visit.    Allergies  Allergen Reactions  . Ativan [Lorazepam] Other (See Comments)    Increases agitation, tolerates xanax  . Lisinopril Cough  . Morphine And Related     Hallucinations, too sedated when given with ativan  . Ibuprofen Hives, Nausea And Vomiting and Rash    Tolerates baby aspirin per patient.      Past Medical History  Diagnosis Date  . CAD (coronary artery disease)     a. s/p CABG x 5;  b. 04/2012 Cath: patent LIMA->LAD and VG->OM3, all other grafts occluded.  . Hypertension   . Anxiety   . History of kidney cancer   . Adjustment disorder with anxiety   . Hyperlipidemia   . BPH (benign prostatic hypertrophy)   . COPD (chronic obstructive pulmonary disease)     a. on home O2.  . Arthritis   . Ischemic cardiomyopathy     a. 06/2013 Echo: EF 30-35%, no reg wma's, Gr2 DD, triv AI, mod dil LA, nl RV fxn.  . Chronic systolic CHF (congestive heart failure)     a. 06/2013 Echo: EF 30-35%.  . ADENOCARCINOMA, PROSTATE dx'd 2012  . Renal cancer dx'd 1997    lt nephrectomy    Past Surgical History  Procedure Laterality Date  . Coronary artery bypass graft      x 5  . Cardiac catheterization    . Nephrectomy      left nephrectomy for ca  . Posterior cervical laminectomy      x 8   limited ROM  and can't lie flat  . Heart stents      x 5  . Robot assisted laparoscopic radical prostatectomy      for prostate cancer  . Cystoscopy with litholapaxy  05/01/2012    Procedure: CYSTOSCOPY WITH LITHOLAPAXY;  Surgeon: Dutch Gray, MD;  Location: [facility];  Service: Urology;  Laterality: N/A;  . Prostate cancer      History  Smoking status  . Former Smoker -- 0.30 packs/day for 54 years  . Types:  Cigarettes  . Quit date: 03/29/2013  Smokeless tobacco  . Former Systems developer  . Quit date: 04/19/2012    Comment: Using e-cig now    History  Alcohol Use No    Family History  Problem Relation Age of Onset  . Diabetes Mother   . Coronary artery disease    . Heart disease Mother   . Heart disease Father   . Heart disease Brother   . Diabetes Father     Review of Systems: The review of systems is per the HPI.  All other systems were reviewed and are negative.  Physical Exam: BP 140/80  Pulse 92  Ht 5' 8.5" (1.74 m)  Wt 216 lb 6.4 oz (98.158 kg)  BMI 32.42 kg/m2  SpO2 94% Patient is very pleasant and in no acute distress. Looks chronically ill. He remains obese. Skin is warm and dry. Color is normal.  HEENT is unremarkable. Normocephalic/atraumatic. PERRL. Sclera are nonicteric. Neck is supple. No masses. No JVD. Lungs are coarse. Cardiac exam shows a regular rate and rhythm today. Abdomen is soft. Extremities are with trace edema. Both lower legs look red - sl warm to touch. Gait and ROM are intact. No gross neurologic deficits noted.  Wt Readings from Last 3 Encounters:  11/21/13 216 lb 6.4 oz (98.158 kg)  11/18/13 219 lb 5.7 oz (99.5 kg)  11/09/13 225 lb 8.5 oz (102.3 kg)         LABORATORY DATA: PENDING   Lab Results  Component Value Date   WBC 11.4* 11/15/2013   HGB 11.9* 11/15/2013   HCT 36.9* 11/15/2013   PLT 124* 11/15/2013   GLUCOSE 89 11/18/2013   CHOL 208* 07/01/2013   TRIG 144 07/01/2013   HDL 34* 07/01/2013   LDLCALC 145* 07/01/2013   ALT 30 11/15/2013   AST 17 11/15/2013   NA 141 11/18/2013   K 3.9 11/18/2013   CL 98 11/18/2013   CREATININE 1.66* 11/18/2013   BUN 37* 11/18/2013   CO2 32 11/18/2013   TSH 1.729 07/01/2013   INR 1.06 10/16/2013   HGBA1C 6.1* 07/01/2013   Echo Study Conclusions from April 2015  -  Left ventricle: The cavity size was normal. Wall thickness was increased in a pattern of moderate LVH. Systolic function was mildly to moderately reduced. The estimated ejection fraction was in the range of 40% to 45%. There is hypokinesis of the mid-distalanteroseptal myocardium. Left ventricular diastolic function parameters were normal. - Aortic valve: Trivial regurgitation. - Mitral valve: Calcified annulus. - Left atrium: The atrium was mildly dilated. - Right atrium: The atrium was mildly dilated.  Coronary angiography:  Coronary dominance: right  Left mainstem: Patent with moderate diffuse disease.  Left anterior descending (LAD): Total proximal occlusion, heavy calcification.  Left circumflex (LCx): Severe diffuse disease with severe stenoses of the first and second obtuse marginal branches at the ostia. Subtotal occlusion of the mid left circumflex.  Right coronary artery (RCA): Total occlusion of the proximal vessel Known frim 20123  SVG - OM3 widely patent, supplies collateral to OM1/2 and RCA branches  SVG - diag total occlusion Known from 2013  SVG - RCA total occlusion Known from 2013  SVG OM4 total occlusion Known from 2013  LIMA - LAD widely patent, supplies collateral to PDA  Final Conclusions:  1. Severe three-vessel coronary artery disease  2. Status post aorta coronary bypass surgery with continued patency of the saphenous vein graft to OM 3 and LIMA to LAD, known chronic occlusion of  all other vein grafts.  3. Normal intracardiac hemodynamics  Recommendations: Continued medical therapy. His coronary anatomy is stable from his prior cardiac catheterization.  Please see separate note by Dr Burt Knack. PV runoff done. Long SFA occlusion. Not a great surgical candidate and doubt long SFA stent would have longevity Medical Rx  Of both PVC and CAD  Lucas [name]   Abdominal aortogram:  The right renal artery is widely patent. The left renal artery is not visualized and it is  possible the catheter is positioned below that vessel. There is no obstruction and a single right renal artery. The distal abdominal aorta is patent with mild dilatation present.  Right leg: The common iliac artery is patent. The external iliac artery is patent with mild diffuse stenosis noted. The internal iliac artery is ectatic but patent. The common femoral artery is calcified with mild nonobstructive stenosis. The deep femoral artery is patent. The superficial femoral artery is diffusely diseased throughout its proximal segment until it occludes in the mid vessel. The vessel reconstitutes in Hunter's canal and is diffusely diseased into the above-knee popliteal. The popliteal artery is patent. The anterior tibial has a high origin and it is totally occluded. The posterior tibial is widely patent. The peroneal artery is patent.  Left leg: The common, external, and internal iliac arteries are patent. The SFA has diffuse moderate calcific stenosis in the midportion. The profundus is patent. The popliteal is patent with mild disease. The peroneal and posterior tibial vessels are patent. The anterior tibial is totally occluded.  Final Conclusions:  1. Minimal aortoiliac disease  2. 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 Moderate diffuse stenosis of the left superficial femoral artery  4. 2 vessel runoff bilaterally  Recommendations: I think medical therapy is probably the best approach here. I will bring the patient back to the office to review revascularization options. I do not think he is a candidate for surgical revascularization because of severe cardiopulmonary disease. Could consider stenting of his totally occluded SFA and will review those options with him.  Lucas [name]  08/21/2013, 1:52 PM     Assessment / Plan:  1. Recurrent respiratory failure from COPD exacerbation/systolic HF - weight is down. Swelling improved. Needs recheck of his labs. He is on ARB  therapy.  2. Claudication/PVD - sees Dr. Trula Slade in early June.   3. CAD - no active chest pain  4. PAF - will check EKG today - he is in sinus today. Not able to afford NOAC - did not take Eliquis - does not wish to take coumadin. CHADSVASC is at least a 4. Explained to him that he is at increased risk for stroke. He would like to discuss with Dr. Johnsie Cancel on follow up.   5. HTN - BP is fair.  6. Early cellulitis - give Keflex 250 mg TID for a week  Will check lab today. See Dr. Johnsie Cancel in early June for follow up. Seeing [facility] in early June as well. He is felt to be at increased risk given his pulmonary/cardiac issues.   Patient is agreeable to this plan and will call if any problems develop in the interim.   Lucas Junes, RN, [place] [address] [place], [place]  [address] [phone]   Addendum:  Notified 11/22/13 for prior authorization for the Eliquis  Lucas [name] has "Called [employer] to get PA for eliquis for the patient. PA was approved  from 11/22/13-11/23/14 and his cost will be $ 3.60. I called the patient to let him know that he could go pick it up from the drug store. Patient was happy about this and said that he would go pick it up.  Ref# [id number]"

## 2013-11-22 ENCOUNTER — Encounter: Payer: PRIVATE HEALTH INSURANCE | Admitting: Nurse Practitioner

## 2013-11-22 ENCOUNTER — Other Ambulatory Visit: Payer: Self-pay | Admitting: *Deleted

## 2013-11-22 ENCOUNTER — Telehealth: Payer: Self-pay

## 2013-11-22 NOTE — Telephone Encounter (Signed)
Called [employer] to get PA for eliquis for the patient. PA was approved from 11/22/13-11/23/14 and his cost will be $ 3.60. I called the patient to let him know that he could go pick it up from the drug store. Patient was happy about this and said that he would go pick it up. Ref# [id number]

## 2013-11-30 ENCOUNTER — Encounter: Payer: Self-pay | Admitting: Adult Health

## 2013-11-30 ENCOUNTER — Ambulatory Visit (INDEPENDENT_AMBULATORY_CARE_PROVIDER_SITE_OTHER): Payer: Medicare Other | Admitting: Adult Health

## 2013-11-30 VITALS — BP 120/68 | HR 100 | Temp 97.6°F | Ht 68.5 in | Wt 215.8 lb

## 2013-11-30 DIAGNOSIS — J449 Chronic obstructive pulmonary disease, unspecified: Secondary | ICD-10-CM

## 2013-11-30 NOTE — Assessment & Plan Note (Signed)
Compensated-on present regimen. Mild rhinitis flare  Recent flare now resolved   Plan  Use Claritin 10mg  daily As needed  Drainage.  Mucinex DM Twice daily   As needed  Cough/congestion  Continue on Dulera 2 puffs Twice daily  -rinse after use.  Use Combivent 1 puff Four times a day  .  Use Duoneb and ProAir as needed -this is your rescue medicine.  follow up Dr. Chase Caller in 4 weeks and As needed   Please contact office for sooner follow up if symptoms do not improve or worsen or seek emergency care

## 2013-11-30 NOTE — Addendum Note (Signed)
Addended by: Maurice March on: 11/30/2013 09:30 AM   Modules accepted: Orders

## 2013-11-30 NOTE — Patient Instructions (Signed)
Use Claritin 10mg  daily As needed  Drainage.  Mucinex DM Twice daily   As needed  Cough/congestion  Continue on Dulera 2 puffs Twice daily  -rinse after use.  Use Combivent 1 puff Four times a day  .  Use Duoneb and ProAir as needed -this is your rescue medicine.  follow up Dr. Chase Caller in 4 weeks and As needed   Please contact office for sooner follow up if symptoms do not improve or worsen or seek emergency care

## 2013-11-30 NOTE — Progress Notes (Signed)
Subjective:    Patient ID: Lucas [name], male    DOB: March 20, 1946, 68 y.o.   MRN: [id number]  HPI    #Smoking  - quit sept 2014 but having withdrawals  #Moderate COPD Spirometry 04/18/12       06/02/12 FEV1 1.64L 46%             1.94  55%   FVC 2.78L 60%               3.01  65% FEV1/FVC 59                   64   #CHF/CAD - s/p CABG. C - Oct 3009 Systolic CHF admission ef 30% - Sept 2330: Diastolic CHF admission  - DEc 2013: EF 30% Admission for Unstable angiona but welll compensated CHF  #Obesity  - Body mass index is 33.15 kg/(m^2). on 08/31/2013 - Have been ruled out January 2015. Advised one liter of oxygen nocturnally    #AECOPD  - 03/26/13 - office Rx - 04/03/13 - AECOPD with Acute Diast CHF admission though 04/06/13 - Jan 2014 with doxy and pred in office with extended pred a week later  #imaging  - 04/03/13 - CHF changes. NEver had CT - 08/31/2013 - prdered CT scan lung cancer scren   OV 08/31/2013 Chief Complaint  Patient presents with  . COPD    follow-up. Pt states he has been having increased productive cough with yellow phlegm, increased SOB, chest congestion,  whezing and chest tightness x 3 days.   Followup COPD   - At last visit mid January 2015 he had a COPD exacerbation with treated with doxycycline and prednisone. He improved after this. In the interim he had a cardiac cath this is listed below. However for the last 3 days is having increased cough, change in color of sputum, increasing wheeze and increased sputum volume. COPD cat score is in the 68s and reflects COPD exacerbation. He takes Combivent respimat which helps him but he does not take ANoro or breo because this does not help him. He is open to taking her steroids  New issue: He is open to having low-dose CT scan of the chest for lung cancer screening because Medicare is paying for it. However, our system not yet set up for this    Past, Family, Social reviewed: Since  last visit he has  had a cardiac catheterization that apparently was clean however, he does have right lower the claudication and apparently he has stenosis. He has a followup with cardiology pending. He is concerned that he might not be a revascularization candidate because of his COPD. In addition Dr. Elsworth Soho for sleep evaluation he tells me that he does not have sleep apnea; confirmed on electronic medical records dated 07/24/2013. His been advised one liters oxygen for sleep     - . CAT COPD Symptom & Quality of Life Score (GSK trademark) 0 is no burden. 5 is highest burden 03/26/2013  04/23/2013  07/17/2013  08/31/2013 aecopd  Never Cough -> Cough all the time 4 2 3 5   No phlegm in chest -> Chest is full of phlegm 4 2 3 4   No chest tightness -> Chest feels very tight 3 2 3 4   No dyspnea for 1 flight stairs/hill -> Very dyspneic for 1 flight of stairs 4 4 4 4   No limitations for ADL at home -> Very limited with ADL at home 4 2 3 4   Confident leaving  home -> Not at all confident leaving home 2 2 2 4   Sleep soundly -> Do not sleep soundly because of lung condition 4 3 3 4   Lots of Energy -> No energy at all 4 4 4 5   TOTAL Score (max 40)  29 21 25  34    11/30/2013 Follow up  Returns for  3 month COPD follow up .  Reports is doing well.  Complains of hoarseness/sore throat .  Has been hospitalized 3x since last ov for CHF/SOB Most recent admit 5/6 for decompensated CHF w/ acute resp fail w/ hypoxia .  He was diuresis with decreased wt and swelling.  Followed closely by cards.  Says swelling is doing much better.   Currently on Dulera Twice daily  And Combivent Four times a day  And uses Duoneb As needed .  Remains on O2 at 2 l/m At bedtime  And with activity As needed   Denies any hemoptysis, fever, or orthopnea, PND, or increased leg swelling   Review of Systems  Constitutional:   No  weight loss, night sweats,  Fevers, chills,  +fatigue, or  lassitude.  HEENT:   No headaches,  Difficulty swallowing,   Tooth/dental problems, or  Sore throat,                No sneezing, itching, ear ache, nasal congestion, post nasal drip,   CV:  No chest pain,  Orthopnea, PND, swelling in lower extremities, anasarca, dizziness, palpitations, syncope.   GI  No heartburn, indigestion, abdominal pain, nausea, vomiting, diarrhea, change in bowel habits, loss of appetite, bloody stools.   Resp:  .  No chest wall deformity  Skin: no rash or lesions.  GU: no dysuria, change in color of urine, no urgency or frequency.  No flank pain, no hematuria   MS:  No joint pain or swelling.  No decreased range of motion.  No back pain.  Psych:  No change in mood or affect. No depression or anxiety.  No memory loss.          Objective:   Physical Exam  GEN: A/Ox3; pleasant , NAD, elderly   HEENT:  [place]/AT,  EACs-clear, TMs-wnl, NOSE-clear, THROAT-clear, no lesions, no postnasal drip or exudate noted.   NECK:  Supple w/ fair ROM; no JVD; normal carotid impulses w/o bruits; no thyromegaly or nodules palpated; no lymphadenopathy.  RESP  Diminished BS in bases , i.no accessory muscle use, no dullness to percussion  CARD:  RRR, no m/r/g  ,no-tr  peripheral edema, pulses intact, no cyanosis or clubbing.venous insufficiency changes   GI:   Soft & nt; nml bowel sounds; no organomegaly or masses detected.  Musco: Warm bil, no deformities or joint swelling noted.   Neuro: alert, no focal deficits noted.    Skin: Warm, no lesions or rashes        Assessment & Plan:

## 2013-12-07 ENCOUNTER — Encounter: Payer: Self-pay | Admitting: Surgery

## 2013-12-10 ENCOUNTER — Other Ambulatory Visit: Payer: Self-pay | Admitting: *Deleted

## 2013-12-10 ENCOUNTER — Encounter: Payer: Self-pay | Admitting: Surgery

## 2013-12-10 ENCOUNTER — Telehealth: Payer: Self-pay | Admitting: *Deleted

## 2013-12-10 ENCOUNTER — Encounter: Payer: Self-pay | Admitting: *Deleted

## 2013-12-10 ENCOUNTER — Ambulatory Visit (INDEPENDENT_AMBULATORY_CARE_PROVIDER_SITE_OTHER): Payer: Medicaid Other | Admitting: Surgery

## 2013-12-10 VITALS — BP 128/63 | HR 87 | Resp 16 | Ht 68.5 in | Wt 214.0 lb

## 2013-12-10 DIAGNOSIS — R209 Unspecified disturbances of skin sensation: Secondary | ICD-10-CM

## 2013-12-10 DIAGNOSIS — I70219 Atherosclerosis of native arteries of extremities with intermittent claudication, unspecified extremity: Secondary | ICD-10-CM | POA: Insufficient documentation

## 2013-12-10 DIAGNOSIS — R200 Anesthesia of skin: Secondary | ICD-10-CM

## 2013-12-10 DIAGNOSIS — R5383 Other fatigue: Secondary | ICD-10-CM

## 2013-12-10 DIAGNOSIS — R202 Paresthesia of skin: Secondary | ICD-10-CM

## 2013-12-10 DIAGNOSIS — R531 Weakness: Secondary | ICD-10-CM | POA: Insufficient documentation

## 2013-12-10 DIAGNOSIS — M79609 Pain in unspecified limb: Secondary | ICD-10-CM | POA: Insufficient documentation

## 2013-12-10 DIAGNOSIS — R5381 Other malaise: Secondary | ICD-10-CM

## 2013-12-10 NOTE — Progress Notes (Signed)
Staff message sent to Dr. Kyla Balzarine office to let him know that we have told the patient to stop his Eliquis 3 days prior to his aortogram on 12-18-13. The patient has an appt with Dr. Johnsie Cancel on 12-17-13.

## 2013-12-10 NOTE — Telephone Encounter (Signed)
Lucas [name], [place], NP; Tamsen Snider            Dr. Trula Slade has scheduled this patient for an aortogram on 12-18-13 and wants his to stop his Eliquis 3 days prior. The patient has an appt with Dr. Johnsie Cancel on 12-17-13 for follow up.    [facility]  [place] [address]  Phone: [phone] Fax: [phone]   Surgical Procedure Clearance Form   Dear Dr. ____Nishan_______________________________________   Your patient ___[name]__________ DOBJul 22, 1947   is scheduled for a surgical procedure on________6-9-15_________________________   Surgery / Procedure: _____Aortogram with bilateral runoff and possible PTA / Stenting.   By Dr._____Wells Brabham_________________________.   [facility] Surgery Scheduling

## 2013-12-10 NOTE — Progress Notes (Signed)
  Referred by:  Lene Mckay R. Shelton, MD [address] [place], [place] [address]  Reason for referral: Intermittent claudication   History of Present Illness  Lucas [name] is a 67 y.o. (04/26/1946) male who presents with chief complaint: right leg pain.  Onset of symptom occurred three months ago.  Pain is described as crampy, severity, and associated with exercise. He is able to ambulate for "about a minute," or less than a block.  Patient has attempted to treat this pain with rest. Resting makes the pain better.  The patient has no rest pain symptoms. He does describe cramps in calves during the night. He is unable to sleep much at night due to his COPD and CHF symptoms. He denies any pain in his left leg.  He is currently on 2L of oxygen at night. He denies any leg wounds/ulcers.  Atherosclerotic risk factors include: coronary artery disease s/p cabg x 5 and PCI, smoking 2packs/day x 57 years (currently using e-cigarette), hyperlipidemia not currently treated with a statin and hypertension treated with an ARB.   He currently has CHF treated with furosemide, COPD treated with SABAs and combined anticholinergic and beta agonist inhaler and home oxygen. He has atrial fibrillation being treated with Eliquis. He is not diabetic. He says he checks his own blood sugars everyday and the readings are in the low 80s.    Past Medical History  Diagnosis Date  . CAD (coronary artery disease)     a. s/p CABG x 5;  b. 04/2012 Cath: patent LIMA->LAD and VG->OM3, all other grafts occluded.  . Hypertension   . Anxiety   . History of kidney cancer   . Adjustment disorder with anxiety   . Hyperlipidemia   . BPH (benign prostatic hypertrophy)   . COPD (chronic obstructive pulmonary disease)     a. on home O2.  . Arthritis   . Ischemic cardiomyopathy     a. 06/2013 Echo: EF 30-35%, no reg wma's, Gr2 DD, triv AI, mod dil LA, nl RV fxn.  . Chronic systolic CHF (congestive heart failure)    a. 06/2013 Echo: EF 30-35%.  . ADENOCARCINOMA, PROSTATE dx'd 2012  . Renal cancer dx'd 1997    lt nephrectomy    Past Surgical History  Procedure Laterality Date  . Coronary artery bypass graft      x 5  . Cardiac catheterization    . Nephrectomy      left nephrectomy for ca  . Posterior cervical laminectomy      x 8   limited ROM  and can't lie flat  . Heart stents      x 5  . Robot assisted laparoscopic radical prostatectomy      for prostate cancer  . Cystoscopy with litholapaxy  05/01/2012    Procedure: CYSTOSCOPY WITH LITHOLAPAXY;  Surgeon: Les Borden, MD;  Location: [facility];  Service: Urology;  Laterality: N/A;  . Prostate cancer    . Kidney surgery Left 1994    History   Social History  . Marital Status: Widowed    Spouse Name: N/A    Number of Children: N/A  . Years of Education: N/A   Occupational History  . disabled    Social History Main Topics  . Smoking status: Former Smoker -- 0.30 packs/day for 54 years    Types: Cigarettes    Quit date: 03/29/2013  . Smokeless tobacco: Former User    Quit date: 04/19/2012     Comment: Using   e-cig now  . Alcohol Use: No  . Drug Use: No  . Sexual Activity: Not Currently   Other Topics Concern  . Not on file   Social History Narrative    He lives in [place] by himself.  He is a widower.    He continues to smoke about 4 or 5  cigarettes a day but has a greater than 100 pack-year history having  previously smoked up to 3-4 packs a day over the span about 48 years.     He denies alcohol or drug use.  He is retired secondary to disability     since the 80s.     Family History  Problem Relation Age of Onset  . Diabetes Mother   . Heart disease Mother   . Hypertension Mother   . Heart attack Mother   . Coronary artery disease    . Heart disease Father   . Diabetes Father   . Heart attack Father   . Heart disease Brother   . Heart attack Brother   . COPD Sister     Current Outpatient Prescriptions on  File Prior to Visit  Medication Sig Dispense Refill  . albuterol (PROVENTIL HFA;VENTOLIN HFA) 108 (90 BASE) MCG/ACT inhaler Inhale 2 puffs into the lungs every 6 (six) hours as needed for wheezing or shortness of breath. Shortness of breath  1 Inhaler  1  . albuterol (PROVENTIL) (2.5 MG/3ML) 0.083% nebulizer solution Take 2.5 mg by nebulization 4 (four) times daily.      . ALPRAZolam (XANAX) 1 MG tablet Take 1 tablet (1 mg total) by mouth 3 (three) times daily as needed for anxiety.  30 tablet  1  . apixaban (ELIQUIS) 5 MG TABS tablet Take 1 tablet (5 mg total) by mouth 2 (two) times daily.  60 tablet  1  . aspirin EC 81 MG tablet Take 81 mg by mouth daily.      . ASSURE LANCETS MISC 1 Units by Does not apply route 2 (two) times daily.  100 each  0  . doxazosin (CARDURA) 8 MG tablet Take 8 mg by mouth at bedtime.       . furosemide (LASIX) 40 MG tablet Take 40 mg by mouth 2 (two) times daily.      . HYDROcodone-acetaminophen (NORCO) 10-325 MG per tablet Take 1 tablet by mouth every 4 (four) hours as needed for moderate pain.      . ipratropium (ATROVENT) 0.02 % nebulizer solution Take 0.5 mg by nebulization 4 (four) times daily.      . Ipratropium-Albuterol (COMBIVENT RESPIMAT) 20-100 MCG/ACT AERS respimat Inhale 1 puff into the lungs every 6 (six) hours as needed for wheezing or shortness of breath.  1 Inhaler  1  . mometasone-formoterol (DULERA) 100-5 MCG/ACT AERO Inhale 2 puffs into the lungs 2 (two) times daily.  1 Inhaler  0  . NITROSTAT 0.3 MG SL tablet Place 0.3 mg under the tongue every 5 (five) minutes as needed for chest pain.       . NON FORMULARY Place 2 L into the nose at bedtime. O2 at night only      . valsartan (DIOVAN) 40 MG tablet Take 40 mg by mouth daily.        No current facility-administered medications on file prior to visit.    Allergies  Allergen Reactions  . Ativan [Lorazepam] Other (See Comments)    Increases agitation, tolerates xanax  . Lisinopril Cough  .  Morphine And Related       Hallucinations, too sedated when given with ativan  . Ibuprofen Hives, Nausea And Vomiting and Rash    Tolerates baby aspirin per patient.      REVIEW OF SYSTEMS:  (Positives checked otherwise negative)  CARDIOVASCULAR:  [ ] chest pain, [ ] chest pressure, [ ] palpitations, [ ] shortness of breath when laying flat, [ ] shortness of breath with exertion,   [ x] pain in legs when walking, [ ] pain in feet when laying flat, [ ] history of blood clot in veins (DVT), [ ] history of phlebitis, [x ] swelling in legs, [ ] varicose veins  PULMONARY:  [ ] productive cough, [ ] asthma, [ ] wheezing  NEUROLOGIC:  [x ] weakness in arms or right leg, [x ] numbness in arms or right leg, [ ] difficulty speaking or slurred speech, [ ] temporary loss of vision in one eye, [ ] dizziness  HEMATOLOGIC:  [ ] bleeding problems, [ ] problems with blood clotting too easily  MUSCULOSKEL:  [ ] joint pain, [ ] joint swelling  GASTROINTEST:  [ ]  Vomiting blood, [ ]  Blood in stool     GENITOURINARY:  [ ]  Burning with urination, [ ]  Blood in urine  PSYCHIATRIC:  [ ] history of major depression  INTEGUMENTARY:  [ ] rashes, [ ] ulcers  CONSTITUTIONAL:  [ ] fever, [ ] chills  For VQI Use Only  PRE-ADM LIVING: Home  AMB STATUS: Ambulatory  CAD Sx: None  PRIOR CHF: Moderate  STRESS TEST: [ ] No, [x ] Normal, [ ] + ischemia, [ ] + MI, [ ] Both  Physical Examination Filed Vitals:   12/10/13 1254  BP: 128/63  Pulse: 87  Resp: 16  Height: 5' 8.5" (1.74 m)  Weight: 214 lb (97.07 kg)  SpO2: 92%   Body mass index is 32.06 kg/(m^2).  General: A&O x 3, obese male   Head: [place]/AT  Ear/Nose/Throat: Hearing grossly intact, nares w/o erythema or drainage  Eyes: PERRLA, EOMI, Post surg chg to pupils  Neck: Supple, no nuchal rigidity, no palpable LAD. Limited flexion and extension of neck   Pulmonary: Sym exp, good air movt, CTAB, no rales, rhonchi, & wheezing  Cardiac: RRR,  Nl S1, S2, no Murmurs, rubs or gallops  Vascular: Vessel Right Left  Radial Palpable Palpable  Brachial  Palpable Palpable  Carotid Palpable, without bruit Palpable, without bruit  Aorta Not palpable N/A  Femoral Palpable Palpable  Popliteal Not palpable Not palpable  PT Not palpable Not palpable  DP Not palpable Not palpable   Gastrointestinal: soft, NTND, -G/R, - HSM, - masses,   Musculoskeletal: M/S 5/5 throughout. Limited rotation of neck. Extremities without ischemic changes.   Neurologic: CN 2-12 intact. Pain and light touch intact in extremities   Psychiatric: Judgment intact, Mood & affect appropriatefor pt's clinical situation, + MDD / + Bipolar / + Poor judgement  Dermatologic: See M/S exam for extremity exam. Hypertrophic nail changes and venous stasis changes on lower legs bilaterally. Scaling of plantar surface of feet bilaterally, more pronounced on right.    Outside Studies/Documentation outside documents were reviewed including: [facility] 10-30-13: ABI  R .48, L .88   Medical Decision Making  Lucas [name] is a 67 y.o. male who presents with: right lower extremity intermittent claudication.   I discussed with the patient the natural history of intermittent claudication: 75% of patients have stable or improved symptoms in a year   an only 2% require amputation. Eventually 20% may require intervention in a year.  I discussed in depth with the patient the nature of atherosclerosis, and emphasized the importance of maximal medical management including strict control of blood pressure, blood glucose, and lipid levels, antiplatelet agent, obtaining regular exercise, and cessation of smoking.    The patient is aware that without maximal medical management the underlying atherosclerotic disease process will progress, limiting the benefit of any interventions. The patient is currently not on a statin.   The patient is currently on an  anti-platelet: aspirin.    He is also currently taking Eliquis for atrial fibrillation. He was advised to discontinue the medication three days prior to his arteriogram.   The patient will be scheduled for a right lower extremity arteriogram on 12/18/2013 with Dr. Brabham. Possible balloon angioplasty intervention was discussed.  Thank you for allowing us to participate in this patient's care  Junella Domke, PA-C [facility] Office: [phone] Pager: [phone]  12/10/2013, 2:14 PM   I agree with the above.  The patient has lifestyle limiting claudication in the right leg.  Doppler studies revealed diminished blood flow.  He does not have active ulceration.  He has palpable femoral pulses.  I discussed with the patient the initial step would be diagnostic angiography.  This will be done through a left femoral approach.  L. plan on imaging the aorta and bilateral runoff.  L. intervene on the right leg if necessary.  This was extensively discussed with the patient.  He has successfully undergone cardiac catheterization in the past, however he cannot lie flat given his COPD.  This procedure has been scheduled for Tuesday, June 9.  He is Eliquis, which will need to be stopped prior to his procedure.  The patient lives by himself and will need to be monitored overnight.  Wells Brabham 

## 2013-12-11 ENCOUNTER — Institutional Professional Consult (permissible substitution): Payer: PRIVATE HEALTH INSURANCE | Admitting: Cardiovascular Disease

## 2013-12-17 ENCOUNTER — Encounter: Payer: Self-pay | Admitting: Cardiovascular Disease

## 2013-12-17 ENCOUNTER — Ambulatory Visit (INDEPENDENT_AMBULATORY_CARE_PROVIDER_SITE_OTHER): Payer: PRIVATE HEALTH INSURANCE | Admitting: Cardiovascular Disease

## 2013-12-17 VITALS — BP 145/81 | HR 98 | Ht 68.0 in | Wt 212.1 lb

## 2013-12-17 DIAGNOSIS — I2510 Atherosclerotic heart disease of native coronary artery without angina pectoris: Secondary | ICD-10-CM

## 2013-12-17 DIAGNOSIS — J449 Chronic obstructive pulmonary disease, unspecified: Secondary | ICD-10-CM

## 2013-12-17 DIAGNOSIS — R531 Weakness: Secondary | ICD-10-CM

## 2013-12-17 DIAGNOSIS — R5381 Other malaise: Secondary | ICD-10-CM

## 2013-12-17 DIAGNOSIS — I509 Heart failure, unspecified: Secondary | ICD-10-CM

## 2013-12-17 DIAGNOSIS — I10 Essential (primary) hypertension: Secondary | ICD-10-CM

## 2013-12-17 DIAGNOSIS — R5383 Other fatigue: Secondary | ICD-10-CM

## 2013-12-17 DIAGNOSIS — I5022 Chronic systolic (congestive) heart failure: Secondary | ICD-10-CM

## 2013-12-17 NOTE — Assessment & Plan Note (Signed)
Significant PVD RLE with resting pain now.  Dr Trula Slade to do diagnostic angio Monday  Should be done with propofol or general anestesia

## 2013-12-17 NOTE — Patient Instructions (Signed)
Your physician wants you to follow-up in:  6 MONTHS WITH DR NISHAN  You will receive a reminder letter in the mail two months in advance. If you don't receive a letter, please call our office to schedule the follow-up appointment. Your physician recommends that you continue on your current medications as directed. Please refer to the Current Medication list given to you today. 

## 2013-12-17 NOTE — Assessment & Plan Note (Signed)
Stable with no angina and good activity level.  Continue medical Rx  

## 2013-12-17 NOTE — Assessment & Plan Note (Signed)
Well controlled.  Continue current medications and low sodium Dash type diet.    

## 2013-12-17 NOTE — Assessment & Plan Note (Signed)
Euvolemic continue current dose of diuretic

## 2013-12-17 NOTE — Assessment & Plan Note (Signed)
No wheezing today continue oxygen at night and current inhalers

## 2013-12-17 NOTE — Progress Notes (Signed)
Patient ID: Lucas [name], male   DOB: 1945-11-26, 68 y.o.   MRN: [id number] Mr. Hallum is seen back today for a one month check. . He has known CAD with past CABG x 5, HTN, anxiety, renal cell carcinoma, prostate cancer, HLD - apparently not able to take statins, COPD on home oxygen, OA, PAD and chronic systolic HF. EF 40 to 45%. His LIMA to the LAD is patent and the SVG to [facility] but all other SVGs remain occluded.  Referred back earlier this year for cardiac cath and lower extremity arteriogram - to continue with medical management. Sent home but returned to the ER the following day following a syncopal spell - no injury. Had received multiple doses of Versed for anxiety - this was felt to be a contributing factor. Negative CT of the abdomen noted.  I have seen him back since December a couple of times for BP checks. Quite limited by his claudication.    Having angiogram with Dr Trula Slade on Monday  I spoke to him personally and suggested propofol or general anesthesia for the procedure Given our previous experience trying to cath him      ROS: Denies fever, malais, weight loss, blurry vision, decreased visual acuity, cough, sputum, SOB, hemoptysis, pleuritic pain, palpitaitons, heartburn, abdominal pain, melena, lower extremity edema, claudication, or rash.  All other systems reviewed and negative  General: Affect appropriate Chronically ill obese white male  HEENT: normal Neck supple with no adenopathy JVP normal no bruits no thyromegaly Lungs clear with no wheezing and good diaphragmatic motion Heart:  S1/S2 no murmur, no rub, gallop or click PMI normal Abdomen: benighn, BS positve, no tenderness, no AAA no bruit.  No HSM or HJR Unable to palpate Right popliteal or DP/PT  No edema Neuro non-focal Skin warm and dry No muscular weakness   Current Outpatient Prescriptions  Medication Sig Dispense Refill  . albuterol (PROVENTIL HFA;VENTOLIN HFA) 108 (90 BASE) MCG/ACT inhaler Inhale 2  puffs into the lungs every 6 (six) hours as needed for wheezing or shortness of breath. Shortness of breath  1 Inhaler  1  . albuterol (PROVENTIL) (2.5 MG/3ML) 0.083% nebulizer solution Take 2.5 mg by nebulization 4 (four) times daily.      Marland Kitchen ALPRAZolam (XANAX) 1 MG tablet Take 1 tablet (1 mg total) by mouth 3 (three) times daily as needed for anxiety.  30 tablet  1  . apixaban (ELIQUIS) 5 MG TABS tablet Take 1 tablet (5 mg total) by mouth 2 (two) times daily.  60 tablet  1  . aspirin EC 81 MG tablet Take 81 mg by mouth daily.      Marilynne Drivers LANCETS MISC 1 Units by Does not apply route 2 (two) times daily.  100 each  0  . doxazosin (CARDURA) 8 MG tablet Take 8 mg by mouth at bedtime.       . furosemide (LASIX) 40 MG tablet Take 40 mg by mouth 2 (two) times daily.      Marland Kitchen HYDROcodone-acetaminophen (NORCO) 10-325 MG per tablet Take 1 tablet by mouth every 4 (four) hours as needed for moderate pain.      Marland Kitchen HYDROmorphone (DILAUDID) 4 MG tablet Take 4 mg by mouth at bedtime.      Marland Kitchen ipratropium (ATROVENT) 0.02 % nebulizer solution Take 0.5 mg by nebulization 4 (four) times daily.      . Ipratropium-Albuterol (COMBIVENT RESPIMAT) 20-100 MCG/ACT AERS respimat Inhale 1 puff into the lungs every 6 (six) hours as needed  for wheezing or shortness of breath.  1 Inhaler  1  . mometasone-formoterol (DULERA) 100-5 MCG/ACT AERO Inhale 2 puffs into the lungs 2 (two) times daily.  1 Inhaler  0  . NITROSTAT 0.3 MG SL tablet Place 0.3 mg under the tongue every 5 (five) minutes as needed for chest pain.       . NON FORMULARY Place 2 L into the nose at bedtime. O2 at night only      . valsartan (DIOVAN) 40 MG tablet Take 40 mg by mouth daily.        No current facility-administered medications for this visit.    Allergies  Ativan; Lisinopril; Morphine and related; and Ibuprofen  Electrocardiogram:  5/13  SR rate 83  PAC nonspecific ST/T wave changes   Assessment and Plan

## 2013-12-19 ENCOUNTER — Telehealth: Payer: Self-pay | Admitting: Cardiovascular Disease

## 2013-12-19 ENCOUNTER — Encounter (HOSPITAL_COMMUNITY): Payer: Self-pay | Admitting: Pharmacy Technician

## 2013-12-19 NOTE — Telephone Encounter (Signed)
New message     Need to confirm CHF diagnosis and want last ejection fraction from his echo

## 2013-12-19 NOTE — Telephone Encounter (Signed)
LEFT MESSAGE FOR  CALL BACK./CY

## 2013-12-20 NOTE — Telephone Encounter (Signed)
LEFT MESSAGE ONCE  AGAIN    VOICE  MAIL STATED  STEPHANIE  WOULD  BE OUT OF OFFICE  UNTIL  12-24-13 .Adonis Housekeeper

## 2013-12-24 MED ORDER — SODIUM CHLORIDE 0.9 % IV SOLN
INTRAVENOUS | Status: DC
  Administered 2013-12-25: 06:00:00 via INTRAVENOUS

## 2013-12-25 ENCOUNTER — Encounter (HOSPITAL_COMMUNITY)
Admission: RE | Disposition: A | Discharge status: Home / Self Care | Payer: Self-pay | Source: Ambulatory Visit | Attending: Surgery

## 2013-12-25 ENCOUNTER — Other Ambulatory Visit: Payer: Self-pay | Admitting: *Deleted

## 2013-12-25 ENCOUNTER — Encounter (HOSPITAL_COMMUNITY): Payer: Self-pay | Admitting: Anesthesiology

## 2013-12-25 ENCOUNTER — Encounter (HOSPITAL_COMMUNITY): Payer: Medicare Other | Admitting: Critical Care Medicine

## 2013-12-25 ENCOUNTER — Observation Stay (HOSPITAL_COMMUNITY)
Admission: RE | Admit: 2013-12-25 | Discharge: 2013-12-26 | Disposition: A | Discharge status: Home / Self Care | Payer: Medicare Other | Source: Ambulatory Visit | Attending: Surgery | Admitting: Surgery

## 2013-12-25 ENCOUNTER — Ambulatory Visit (HOSPITAL_COMMUNITY): Payer: Medicare Other | Admitting: Critical Care Medicine

## 2013-12-25 DIAGNOSIS — Z951 Presence of aortocoronary bypass graft: Secondary | ICD-10-CM | POA: Insufficient documentation

## 2013-12-25 DIAGNOSIS — I2510 Atherosclerotic heart disease of native coronary artery without angina pectoris: Secondary | ICD-10-CM | POA: Insufficient documentation

## 2013-12-25 DIAGNOSIS — Z85528 Personal history of other malignant neoplasm of kidney: Secondary | ICD-10-CM | POA: Diagnosis not present

## 2013-12-25 DIAGNOSIS — Z9981 Dependence on supplemental oxygen: Secondary | ICD-10-CM | POA: Insufficient documentation

## 2013-12-25 DIAGNOSIS — I4891 Unspecified atrial fibrillation: Secondary | ICD-10-CM | POA: Diagnosis not present

## 2013-12-25 DIAGNOSIS — I2589 Other forms of chronic ischemic heart disease: Secondary | ICD-10-CM | POA: Diagnosis not present

## 2013-12-25 DIAGNOSIS — I5022 Chronic systolic (congestive) heart failure: Secondary | ICD-10-CM | POA: Diagnosis not present

## 2013-12-25 DIAGNOSIS — I509 Heart failure, unspecified: Secondary | ICD-10-CM | POA: Insufficient documentation

## 2013-12-25 DIAGNOSIS — I739 Peripheral vascular disease, unspecified: Secondary | ICD-10-CM | POA: Diagnosis present

## 2013-12-25 DIAGNOSIS — Z7901 Long term (current) use of anticoagulants: Secondary | ICD-10-CM | POA: Insufficient documentation

## 2013-12-25 DIAGNOSIS — I10 Essential (primary) hypertension: Secondary | ICD-10-CM | POA: Diagnosis not present

## 2013-12-25 DIAGNOSIS — Z01810 Encounter for preprocedural cardiovascular examination: Secondary | ICD-10-CM

## 2013-12-25 DIAGNOSIS — J449 Chronic obstructive pulmonary disease, unspecified: Secondary | ICD-10-CM | POA: Insufficient documentation

## 2013-12-25 DIAGNOSIS — F411 Generalized anxiety disorder: Secondary | ICD-10-CM | POA: Diagnosis not present

## 2013-12-25 DIAGNOSIS — F17200 Nicotine dependence, unspecified, uncomplicated: Secondary | ICD-10-CM | POA: Diagnosis not present

## 2013-12-25 DIAGNOSIS — N400 Enlarged prostate without lower urinary tract symptoms: Secondary | ICD-10-CM | POA: Diagnosis not present

## 2013-12-25 DIAGNOSIS — I70219 Atherosclerosis of native arteries of extremities with intermittent claudication, unspecified extremity: Secondary | ICD-10-CM

## 2013-12-25 DIAGNOSIS — M129 Arthropathy, unspecified: Secondary | ICD-10-CM | POA: Insufficient documentation

## 2013-12-25 DIAGNOSIS — E785 Hyperlipidemia, unspecified: Secondary | ICD-10-CM | POA: Diagnosis not present

## 2013-12-25 DIAGNOSIS — J4489 Other specified chronic obstructive pulmonary disease: Secondary | ICD-10-CM | POA: Insufficient documentation

## 2013-12-25 DIAGNOSIS — Z8546 Personal history of malignant neoplasm of prostate: Secondary | ICD-10-CM | POA: Insufficient documentation

## 2013-12-25 DIAGNOSIS — Z905 Acquired absence of kidney: Secondary | ICD-10-CM | POA: Insufficient documentation

## 2013-12-25 DIAGNOSIS — Z7982 Long term (current) use of aspirin: Secondary | ICD-10-CM | POA: Insufficient documentation

## 2013-12-25 HISTORY — PX: ABDOMINAL AORTAGRAM: SHX5454

## 2013-12-25 LAB — COMPREHENSIVE METABOLIC PANEL
ALT: 13 U/L (ref 0–53)
AST: 16 U/L (ref 0–37)
Albumin: 3.6 g/dL (ref 3.5–5.2)
Alkaline Phosphatase: 56 U/L (ref 39–117)
BUN: 22 mg/dL (ref 6–23)
CO2: 24 meq/L (ref 19–32)
Calcium: 9.2 mg/dL (ref 8.4–10.5)
Chloride: 102 mEq/L (ref 96–112)
Creatinine, Ser: 1.2 mg/dL (ref 0.50–1.35)
GFR calc Af Amer: 70 mL/min — ABNORMAL LOW (ref >90)
GFR, EST NON AFRICAN AMERICAN: 60 mL/min — AB (ref >90)
Glucose, Bld: 89 mg/dL (ref 70–99)
Potassium: 3.7 mEq/L (ref 3.7–5.3)
Sodium: 142 mEq/L (ref 137–147)
Total Bilirubin: 0.3 mg/dL (ref 0.3–1.2)
Total Protein: 7.4 g/dL (ref 6.0–8.3)

## 2013-12-25 LAB — POCT I-STAT, CHEM 8
BUN: 22 mg/dL (ref 6–23)
CHLORIDE: 102 meq/L (ref 96–112)
Calcium, Ion: 1.24 mmol/L (ref 1.13–1.30)
Creatinine, Ser: 1.3 mg/dL (ref 0.50–1.35)
Glucose, Bld: 104 mg/dL — ABNORMAL HIGH (ref 70–99)
HCT: 40 % (ref 39.0–52.0)
Hemoglobin: 13.6 g/dL (ref 13.0–17.0)
Potassium: 3.5 mEq/L — ABNORMAL LOW (ref 3.7–5.3)
SODIUM: 144 meq/L (ref 137–147)
TCO2: 23 mmol/L (ref 0–100)

## 2013-12-25 LAB — GLUCOSE, CAPILLARY
Glucose-Capillary: 114 mg/dL — ABNORMAL HIGH (ref 70–99)
Glucose-Capillary: 114 mg/dL — ABNORMAL HIGH (ref 70–99)

## 2013-12-25 LAB — CBC
HCT: 37.2 % — ABNORMAL LOW (ref 39.0–52.0)
Hemoglobin: 12 g/dL — ABNORMAL LOW (ref 13.0–17.0)
MCH: 29.6 pg (ref 26.0–34.0)
MCHC: 32.3 g/dL (ref 30.0–36.0)
MCV: 91.6 fL (ref 78.0–100.0)
Platelets: 177 10*3/uL (ref 150–400)
RBC: 4.06 MIL/uL — AB (ref 4.22–5.81)
RDW: 16.1 % — AB (ref 11.5–15.5)
WBC: 7.1 10*3/uL (ref 4.0–10.5)

## 2013-12-25 LAB — POCT ACTIVATED CLOTTING TIME: ACTIVATED CLOTTING TIME: 171 s

## 2013-12-25 LAB — PROTIME-INR
INR: 1.17 (ref 0.00–1.49)
Prothrombin Time: 14.7 seconds (ref 11.6–15.2)

## 2013-12-25 SURGERY — ABDOMINAL AORTAGRAM
Anesthesia: Monitor Anesthesia Care | Laterality: Right

## 2013-12-25 MED ORDER — ALBUTEROL SULFATE (2.5 MG/3ML) 0.083% IN NEBU: 2.5000 mg | INHALATION_SOLUTION | Freq: Four times a day (QID) | RESPIRATORY_TRACT | Status: DC

## 2013-12-25 MED ORDER — PHENOL 1.4 % MT LIQD
1.0000 | OROMUCOSAL | Status: DC | PRN
  Filled 2013-12-25: qty 177

## 2013-12-25 MED ORDER — ACETAMINOPHEN 650 MG RE SUPP: 325.0000 mg | RECTAL | Status: DC | PRN

## 2013-12-25 MED ORDER — IPRATROPIUM BROMIDE 0.02 % IN SOLN: 0.5000 mg | Freq: Four times a day (QID) | RESPIRATORY_TRACT | Status: DC

## 2013-12-25 MED ORDER — IPRATROPIUM-ALBUTEROL 0.5-2.5 (3) MG/3ML IN SOLN: 3.0000 mL | Freq: Four times a day (QID) | RESPIRATORY_TRACT | Status: DC | PRN

## 2013-12-25 MED ORDER — ALBUTEROL SULFATE (2.5 MG/3ML) 0.083% IN NEBU: 3.0000 mL | INHALATION_SOLUTION | Freq: Four times a day (QID) | RESPIRATORY_TRACT | Status: DC | PRN

## 2013-12-25 MED ORDER — METOPROLOL TARTRATE 1 MG/ML IV SOLN: 2.0000 mg | INTRAVENOUS | Status: DC | PRN

## 2013-12-25 MED ORDER — IPRATROPIUM-ALBUTEROL 0.5-2.5 (3) MG/3ML IN SOLN: 3.0000 mL | Freq: Four times a day (QID) | RESPIRATORY_TRACT | Status: DC

## 2013-12-25 MED ORDER — APIXABAN 5 MG PO TABS
5.0000 mg | ORAL_TABLET | Freq: Two times a day (BID) | ORAL | Status: DC
  Administered 2013-12-25 (×2): 5 mg via ORAL
  Filled 2013-12-25 (×4): qty 1

## 2013-12-25 MED ORDER — METOPROLOL TARTRATE 1 MG/ML IV SOLN
2.0000 mg | INTRAVENOUS | Status: DC | PRN
Start: 2013-12-25 — End: 2013-12-25

## 2013-12-25 MED ORDER — HYDRALAZINE HCL 20 MG/ML IJ SOLN
10.0000 mg | INTRAMUSCULAR | Status: DC | PRN
Start: 2013-12-25 — End: 2013-12-26

## 2013-12-25 MED ORDER — HYDRALAZINE HCL 20 MG/ML IJ SOLN: 10.0000 mg | INTRAMUSCULAR | Status: DC | PRN

## 2013-12-25 MED ORDER — ALUM & MAG HYDROXIDE-SIMETH 200-200-20 MG/5ML PO SUSP: 15.0000 mL | ORAL | Status: DC | PRN

## 2013-12-25 MED ORDER — DOXAZOSIN MESYLATE 8 MG PO TABS
8.0000 mg | ORAL_TABLET | Freq: Every day | ORAL | Status: DC
  Administered 2013-12-25: 8 mg via ORAL
  Filled 2013-12-25 (×2): qty 1

## 2013-12-25 MED ORDER — ALPRAZOLAM 0.5 MG PO TABS
1.0000 mg | ORAL_TABLET | Freq: Three times a day (TID) | ORAL | Status: DC | PRN
  Administered 2013-12-25 (×2): 1 mg via ORAL
  Filled 2013-12-25 (×2): qty 2

## 2013-12-25 MED ORDER — IPRATROPIUM-ALBUTEROL 0.5-2.5 (3) MG/3ML IN SOLN
3.0000 mL | Freq: Four times a day (QID) | RESPIRATORY_TRACT | Status: DC
  Administered 2013-12-25 – 2013-12-26 (×4): 3 mL via RESPIRATORY_TRACT
  Filled 2013-12-25 (×3): qty 3

## 2013-12-25 MED ORDER — ONDANSETRON HCL 4 MG/2ML IJ SOLN: 4.0000 mg | Freq: Four times a day (QID) | INTRAMUSCULAR | Status: DC | PRN

## 2013-12-25 MED ORDER — IRBESARTAN 75 MG PO TABS
75.0000 mg | ORAL_TABLET | Freq: Every day | ORAL | Status: DC
  Administered 2013-12-25: 75 mg via ORAL
  Filled 2013-12-25 (×2): qty 1

## 2013-12-25 MED ORDER — INSULIN ASPART 100 UNIT/ML ~~LOC~~ SOLN: 0.0000 [IU] | Freq: Three times a day (TID) | SUBCUTANEOUS | Status: DC

## 2013-12-25 MED ORDER — POTASSIUM CHLORIDE CRYS ER 20 MEQ PO TBCR
20.0000 meq | EXTENDED_RELEASE_TABLET | Freq: Once | ORAL | Status: AC
  Administered 2013-12-25: 20 meq via ORAL
  Filled 2013-12-25: qty 2

## 2013-12-25 MED ORDER — HYDROMORPHONE HCL PF 1 MG/ML IJ SOLN
0.5000 mg | INTRAMUSCULAR | Status: DC | PRN
  Administered 2013-12-25 – 2013-12-26 (×5): 1 mg via INTRAVENOUS
  Filled 2013-12-25 (×5): qty 1

## 2013-12-25 MED ORDER — SODIUM CHLORIDE 0.9 % IV SOLN
1.0000 mL/kg/h | INTRAVENOUS | Status: AC
Start: 2013-12-25 — End: 2013-12-25

## 2013-12-25 MED ORDER — ASPIRIN EC 81 MG PO TBEC
81.0000 mg | DELAYED_RELEASE_TABLET | Freq: Every day | ORAL | Status: DC
  Administered 2013-12-25: 81 mg via ORAL
  Filled 2013-12-25 (×2): qty 1

## 2013-12-25 MED ORDER — HYDROCODONE-ACETAMINOPHEN 10-325 MG PO TABS
1.0000 | ORAL_TABLET | ORAL | Status: DC | PRN
  Administered 2013-12-25 (×2): 1 via ORAL
  Filled 2013-12-25 (×2): qty 1

## 2013-12-25 MED ORDER — HYDROMORPHONE HCL 2 MG PO TABS
4.0000 mg | ORAL_TABLET | Freq: Three times a day (TID) | ORAL | Status: DC
  Administered 2013-12-25 – 2013-12-26 (×3): 4 mg via ORAL
  Filled 2013-12-25 (×3): qty 2

## 2013-12-25 MED ORDER — IPRATROPIUM-ALBUTEROL 0.5-2.5 (3) MG/3ML IN SOLN
3.0000 mL | RESPIRATORY_TRACT | Status: DC
  Filled 2013-12-25: qty 3

## 2013-12-25 MED ORDER — PHENOL 1.4 % MT LIQD: 1.0000 | OROMUCOSAL | Status: DC | PRN

## 2013-12-25 MED ORDER — ALUM & MAG HYDROXIDE-SIMETH 200-200-20 MG/5ML PO SUSP
15.0000 mL | ORAL | Status: DC | PRN
Start: 2013-12-25 — End: 2013-12-25

## 2013-12-25 MED ORDER — HEPARIN SODIUM (PORCINE) 1000 UNIT/ML IJ SOLN
INTRAMUSCULAR | Status: AC
  Filled 2013-12-25: qty 1

## 2013-12-25 MED ORDER — NITROGLYCERIN 0.3 MG SL SUBL
0.3000 mg | SUBLINGUAL_TABLET | SUBLINGUAL | Status: DC | PRN
  Filled 2013-12-25: qty 100

## 2013-12-25 MED ORDER — LIDOCAINE HCL (PF) 1 % IJ SOLN
INTRAMUSCULAR | Status: AC
  Filled 2013-12-25: qty 30

## 2013-12-25 MED ORDER — HYDROMORPHONE HCL PF 1 MG/ML IJ SOLN: 0.2500 mg | INTRAMUSCULAR | Status: DC | PRN

## 2013-12-25 MED ORDER — ONDANSETRON HCL 4 MG/2ML IJ SOLN: 4.0000 mg | Freq: Once | INTRAMUSCULAR | Status: AC | PRN

## 2013-12-25 MED ORDER — LABETALOL HCL 5 MG/ML IV SOLN
10.0000 mg | INTRAVENOUS | Status: DC | PRN
Start: 2013-12-25 — End: 2013-12-26
  Filled 2013-12-25: qty 4

## 2013-12-25 MED ORDER — ACETAMINOPHEN 325 MG PO TABS: 325.0000 mg | ORAL_TABLET | ORAL | Status: DC | PRN

## 2013-12-25 MED ORDER — GUAIFENESIN-DM 100-10 MG/5ML PO SYRP
15.0000 mL | ORAL_SOLUTION | ORAL | Status: DC | PRN
  Administered 2013-12-25: 15 mL via ORAL
  Filled 2013-12-25: qty 15

## 2013-12-25 MED ORDER — HEPARIN (PORCINE) IN NACL 2-0.9 UNIT/ML-% IJ SOLN
INTRAMUSCULAR | Status: AC
  Filled 2013-12-25: qty 1000

## 2013-12-25 MED ORDER — FUROSEMIDE 20 MG PO TABS
20.0000 mg | ORAL_TABLET | Freq: Two times a day (BID) | ORAL | Status: DC
  Administered 2013-12-25 – 2013-12-26 (×2): 20 mg via ORAL
  Filled 2013-12-25 (×4): qty 1

## 2013-12-25 MED ORDER — PANTOPRAZOLE SODIUM 40 MG PO TBEC
40.0000 mg | DELAYED_RELEASE_TABLET | Freq: Every day | ORAL | Status: DC
  Filled 2013-12-25: qty 1

## 2013-12-25 MED ORDER — LABETALOL HCL 5 MG/ML IV SOLN
10.0000 mg | INTRAVENOUS | Status: DC | PRN
  Filled 2013-12-25: qty 4

## 2013-12-25 MED ORDER — GUAIFENESIN-DM 100-10 MG/5ML PO SYRP: 15.0000 mL | ORAL_SOLUTION | ORAL | Status: DC | PRN

## 2013-12-25 MED ORDER — MOMETASONE FURO-FORMOTEROL FUM 100-5 MCG/ACT IN AERO
2.0000 | INHALATION_SPRAY | Freq: Two times a day (BID) | RESPIRATORY_TRACT | Status: DC
  Administered 2013-12-25 – 2013-12-26 (×2): 2 via RESPIRATORY_TRACT
  Filled 2013-12-25: qty 8.8

## 2013-12-25 SURGICAL SUPPLY — 55 items
ADH SKN CLS APL DERMABOND .7 (GAUZE/BANDAGES/DRESSINGS) ×2
BANDAGE ELASTIC 4 VELCRO ST LF (GAUZE/BANDAGES/DRESSINGS) IMPLANT
BANDAGE ESMARK 6X9 LF (GAUZE/BANDAGES/DRESSINGS) IMPLANT
BNDG CMPR 9X6 STRL LF SNTH (GAUZE/BANDAGES/DRESSINGS)
BNDG ESMARK 6X9 LF (GAUZE/BANDAGES/DRESSINGS)
CANISTER SUCTION 2500CC (MISCELLANEOUS) ×4 IMPLANT
CLIP TI MEDIUM 24 (CLIP) ×4 IMPLANT
CLIP TI WIDE RED SMALL 24 (CLIP) ×4 IMPLANT
COVER SURGICAL LIGHT HANDLE (MISCELLANEOUS) ×4 IMPLANT
CUFF TOURNIQUET SINGLE 24IN (TOURNIQUET CUFF) IMPLANT
CUFF TOURNIQUET SINGLE 34IN LL (TOURNIQUET CUFF) IMPLANT
CUFF TOURNIQUET SINGLE 44IN (TOURNIQUET CUFF) IMPLANT
DERMABOND ADVANCED (GAUZE/BANDAGES/DRESSINGS) ×2
DERMABOND ADVANCED .7 DNX12 (GAUZE/BANDAGES/DRESSINGS) ×2 IMPLANT
DRAIN CHANNEL 15F RND FF W/TCR (WOUND CARE) IMPLANT
DRAPE WARM FLUID 44X44 (DRAPE) ×4 IMPLANT
DRAPE X-RAY CASS 24X20 (DRAPES) IMPLANT
DRSG COVADERM 4X10 (GAUZE/BANDAGES/DRESSINGS) IMPLANT
DRSG COVADERM 4X8 (GAUZE/BANDAGES/DRESSINGS) IMPLANT
ELECT REM PT RETURN 9FT ADLT (ELECTROSURGICAL) ×4
ELECTRODE REM PT RTRN 9FT ADLT (ELECTROSURGICAL) ×2 IMPLANT
EVACUATOR SILICONE 100CC (DRAIN) IMPLANT
GLOVE BIOGEL PI IND STRL 7.5 (GLOVE) ×2 IMPLANT
GLOVE BIOGEL PI INDICATOR 7.5 (GLOVE) ×2
GLOVE SURG SS PI 7.5 STRL IVOR (GLOVE) ×4 IMPLANT
GOWN PREVENTION PLUS XXLARGE (GOWN DISPOSABLE) ×4 IMPLANT
GOWN STRL NON-REIN LRG LVL3 (GOWN DISPOSABLE) ×12 IMPLANT
HEMOSTAT SNOW SURGICEL 2X4 (HEMOSTASIS) IMPLANT
KIT BASIN OR (CUSTOM PROCEDURE TRAY) ×4 IMPLANT
KIT ROOM TURNOVER OR (KITS) ×4 IMPLANT
MARKER GRAFT CORONARY BYPASS (MISCELLANEOUS) IMPLANT
NS IRRIG 1000ML POUR BTL (IV SOLUTION) ×8 IMPLANT
PACK PERIPHERAL VASCULAR (CUSTOM PROCEDURE TRAY) ×4 IMPLANT
PAD ARMBOARD 7.5X6 YLW CONV (MISCELLANEOUS) ×8 IMPLANT
PADDING CAST COTTON 6X4 STRL (CAST SUPPLIES) IMPLANT
SET COLLECT BLD 21X3/4 12 (NEEDLE) IMPLANT
STOPCOCK 4 WAY LG BORE MALE ST (IV SETS) IMPLANT
SUT ETHILON 3 0 PS 1 (SUTURE) IMPLANT
SUT PROLENE 5 0 C 1 24 (SUTURE) ×4 IMPLANT
SUT PROLENE 6 0 BV (SUTURE) ×4 IMPLANT
SUT PROLENE 7 0 BV 1 (SUTURE) IMPLANT
SUT SILK 2 0 SH (SUTURE) ×4 IMPLANT
SUT SILK 3 0 (SUTURE)
SUT SILK 3-0 18XBRD TIE 12 (SUTURE) IMPLANT
SUT VIC AB 2-0 CT1 27 (SUTURE) ×8
SUT VIC AB 2-0 CT1 TAPERPNT 27 (SUTURE) ×4 IMPLANT
SUT VIC AB 3-0 SH 27 (SUTURE) ×8
SUT VIC AB 3-0 SH 27X BRD (SUTURE) ×4 IMPLANT
SUT VICRYL 4-0 PS2 18IN ABS (SUTURE) ×8 IMPLANT
TOWEL OR 17X24 6PK STRL BLUE (TOWEL DISPOSABLE) ×8 IMPLANT
TOWEL OR 17X26 10 PK STRL BLUE (TOWEL DISPOSABLE) ×8 IMPLANT
TRAY FOLEY CATH 16FRSI W/METER (SET/KITS/TRAYS/PACK) ×4 IMPLANT
TUBING EXTENTION W/L.L. (IV SETS) IMPLANT
UNDERPAD 30X30 INCONTINENT (UNDERPADS AND DIAPERS) ×4 IMPLANT
WATER STERILE IRR 1000ML POUR (IV SOLUTION) ×4 IMPLANT

## 2013-12-25 NOTE — Anesthesia Postprocedure Evaluation (Signed)
  Anesthesia Post-op Note  Patient: Lucas [name]  Procedure(s) Performed: Procedure(s) with comments: ABDOMINAL AORTAGRAM (N/A) ANGIOGRAM EXTREMITY RIGHT (Right) PTA PERIPHERAL ARTERY (Right) - external iliac  Patient Location: Cath Lab  Anesthesia Type:MAC  Level of Consciousness: awake, alert , oriented and patient cooperative  Airway and Oxygen Therapy: Patient Spontanous Breathing  Post-op Pain: none  Post-op Assessment: Post-op Vital signs reviewed, Patient's Cardiovascular Status Stable, Respiratory Function Stable and No signs of Nausea or vomiting  Post-op Vital Signs: stable  Last Vitals:  Filed Vitals:   12/25/13 0807  BP:   Pulse: 60  Temp:   Resp:     Complications: No apparent anesthesia complications

## 2013-12-25 NOTE — Transfer of Care (Signed)
Immediate Anesthesia Transfer of Care Note  Patient: Lucas [name]  Procedure(s) Performed: Procedure(s) with comments: ABDOMINAL AORTAGRAM (N/A) ANGIOGRAM EXTREMITY RIGHT (Right) PTA PERIPHERAL ARTERY (Right) - external iliac  Patient Location: Cath lab holding  Anesthesia Type:MAC  Level of Consciousness: awake, alert  and oriented  Airway & Oxygen Therapy: Patient Spontanous Breathing and Patient connected to face mask oxygen  Post-op Assessment: Report given to PACU RN, Post -op Vital signs reviewed and stable and Patient moving all extremities X 4  Post vital signs: Reviewed and stable  Complications: No apparent anesthesia complications

## 2013-12-25 NOTE — Telephone Encounter (Signed)
F/u    [place] nurse is returning your call.

## 2013-12-25 NOTE — Anesthesia Preprocedure Evaluation (Signed)
Anesthesia Evaluation  Patient identified by MRN, date of birth, ID band Patient awake    Reviewed: Allergy & Precautions, H&P , NPO status , Patient's Chart, lab work & pertinent test results  Airway       Dental   Pulmonary sleep apnea , COPDformer smoker,          Cardiovascular hypertension, + angina + CAD, + Peripheral Vascular Disease and +CHF + dysrhythmias Atrial Fibrillation     Neuro/Psych    GI/Hepatic   Endo/Other    Renal/GU Renal disease     Musculoskeletal   Abdominal   Peds  Hematology   Anesthesia Other Findings   Reproductive/Obstetrics                           Anesthesia Physical Anesthesia Plan  ASA: IV  Anesthesia Plan: MAC   Post-op Pain Management:    Induction: Intravenous  Airway Management Planned: Mask  Additional Equipment:   Intra-op Plan:   Post-operative Plan:   Informed Consent: I have reviewed the patients History and Physical, chart, labs and discussed the procedure including the risks, benefits and alternatives for the proposed anesthesia with the patient or authorized representative who has indicated his/her understanding and acceptance.     Plan Discussed with:   Anesthesia Plan Comments:         Anesthesia Quick Evaluation

## 2013-12-25 NOTE — Telephone Encounter (Signed)
lmtcb ./cy 

## 2013-12-25 NOTE — Interval H&P Note (Signed)
History and Physical Interval Note:  12/25/2013 7:27 AM  Lucas [name]  has presented today for surgery, with the diagnosis of pvd  The various methods of treatment have been discussed with the patient and family. After consideration of risks, benefits and other options for treatment, the patient has consented to  Procedure(s): ABDOMINAL AORTAGRAM (N/A) as a surgical intervention .  The patient's history has been reviewed, patient examined, no change in status, stable for surgery.  I have reviewed the patient's chart and labs.  Questions were answered to the patient's satisfaction.     BRABHAM IV, V. WELLS

## 2013-12-25 NOTE — H&P (View-Only) (Signed)
Referred by:  Lucas [name]. Lucas [name], [address] [place], [place] [address]  Reason for referral: Intermittent claudication   History of Present Illness  Lucas [name] is a 68 y.o. (05-25-1946) male who presents with chief complaint: right leg pain.  Onset of symptom occurred three months ago.  Pain is described as crampy, severity, and associated with exercise. He is able to ambulate for "about a minute," or less than a block.  Patient has attempted to treat this pain with rest. Resting makes the pain better.  The patient has no rest pain symptoms. He does describe cramps in calves during the night. He is unable to sleep much at night due to his COPD and CHF symptoms. He denies any pain in his left leg.  He is currently on 2L of oxygen at night. He denies any leg wounds/ulcers.  Atherosclerotic risk factors include: coronary artery disease s/p cabg x 5 and PCI, smoking 2packs/day x 57 years (currently using e-cigarette), hyperlipidemia not currently treated with a statin and hypertension treated with an ARB.   He currently has CHF treated with furosemide, COPD treated with SABAs and combined anticholinergic and beta agonist inhaler and home oxygen. He has atrial fibrillation being treated with Eliquis. He is not diabetic. He says he checks his own blood sugars everyday and the readings are in the low 80s.    Past Medical History  Diagnosis Date  . CAD (coronary artery disease)     a. s/p CABG x 5;  b. 04/2012 Cath: patent LIMA->LAD and VG->OM3, all other grafts occluded.  . Hypertension   . Anxiety   . History of kidney cancer   . Adjustment disorder with anxiety   . Hyperlipidemia   . BPH (benign prostatic hypertrophy)   . COPD (chronic obstructive pulmonary disease)     a. on home O2.  . Arthritis   . Ischemic cardiomyopathy     a. 06/2013 Echo: EF 30-35%, no reg wma's, Gr2 DD, triv AI, mod dil LA, nl RV fxn.  . Chronic systolic CHF (congestive heart failure)    a. 06/2013 Echo: EF 30-35%.  . ADENOCARCINOMA, PROSTATE dx'd 2012  . Renal cancer dx'd 1997    lt nephrectomy    Past Surgical History  Procedure Laterality Date  . Coronary artery bypass graft      x 5  . Cardiac catheterization    . Nephrectomy      left nephrectomy for ca  . Posterior cervical laminectomy      x 8   limited ROM  and can't lie flat  . Heart stents      x 5  . Robot assisted laparoscopic radical prostatectomy      for prostate cancer  . Cystoscopy with litholapaxy  05/01/2012    Procedure: CYSTOSCOPY WITH LITHOLAPAXY;  Surgeon: Dutch Gray, MD;  Location: [facility];  Service: Urology;  Laterality: N/A;  . Prostate cancer    . Kidney surgery Left 1994    History   Social History  . Marital Status: Widowed    Spouse Name: N/A    Number of Children: N/A  . Years of Education: N/A   Occupational History  . disabled    Social History Main Topics  . Smoking status: Former Smoker -- 0.30 packs/day for 54 years    Types: Cigarettes    Quit date: 03/29/2013  . Smokeless tobacco: Former Systems developer    Quit date: 04/19/2012     Comment: Using  e-cig now  . Alcohol Use: No  . Drug Use: No  . Sexual Activity: Not Currently   Other Topics Concern  . Not on file   Social History Narrative    He lives in [place] by himself.  He is a widower.    He continues to smoke about 4 or 5  cigarettes a day but has a greater than 100 pack-year history having  previously smoked up to 3-4 packs a day over the span about 48 years.     He denies alcohol or drug use.  He is retired secondary to disability     since the 37s.     Family History  Problem Relation Age of Onset  . Diabetes Mother   . Heart disease Mother   . Hypertension Mother   . Heart attack Mother   . Coronary artery disease    . Heart disease Father   . Diabetes Father   . Heart attack Father   . Heart disease Brother   . Heart attack Brother   . COPD Sister     Current Outpatient Prescriptions on  File Prior to Visit  Medication Sig Dispense Refill  . albuterol (PROVENTIL HFA;VENTOLIN HFA) 108 (90 BASE) MCG/ACT inhaler Inhale 2 puffs into the lungs every 6 (six) hours as needed for wheezing or shortness of breath. Shortness of breath  1 Inhaler  1  . albuterol (PROVENTIL) (2.5 MG/3ML) 0.083% nebulizer solution Take 2.5 mg by nebulization 4 (four) times daily.      Marland Kitchen ALPRAZolam (XANAX) 1 MG tablet Take 1 tablet (1 mg total) by mouth 3 (three) times daily as needed for anxiety.  30 tablet  1  . apixaban (ELIQUIS) 5 MG TABS tablet Take 1 tablet (5 mg total) by mouth 2 (two) times daily.  60 tablet  1  . aspirin EC 81 MG tablet Take 81 mg by mouth daily.      Marilynne Drivers LANCETS MISC 1 Units by Does not apply route 2 (two) times daily.  100 each  0  . doxazosin (CARDURA) 8 MG tablet Take 8 mg by mouth at bedtime.       . furosemide (LASIX) 40 MG tablet Take 40 mg by mouth 2 (two) times daily.      Marland Kitchen HYDROcodone-acetaminophen (NORCO) 10-325 MG per tablet Take 1 tablet by mouth every 4 (four) hours as needed for moderate pain.      Marland Kitchen ipratropium (ATROVENT) 0.02 % nebulizer solution Take 0.5 mg by nebulization 4 (four) times daily.      . Ipratropium-Albuterol (COMBIVENT RESPIMAT) 20-100 MCG/ACT AERS respimat Inhale 1 puff into the lungs every 6 (six) hours as needed for wheezing or shortness of breath.  1 Inhaler  1  . mometasone-formoterol (DULERA) 100-5 MCG/ACT AERO Inhale 2 puffs into the lungs 2 (two) times daily.  1 Inhaler  0  . NITROSTAT 0.3 MG SL tablet Place 0.3 mg under the tongue every 5 (five) minutes as needed for chest pain.       . NON FORMULARY Place 2 L into the nose at bedtime. O2 at night only      . valsartan (DIOVAN) 40 MG tablet Take 40 mg by mouth daily.        No current facility-administered medications on file prior to visit.    Allergies  Allergen Reactions  . Ativan [Lorazepam] Other (See Comments)    Increases agitation, tolerates xanax  . Lisinopril Cough  .  Morphine And Related  Hallucinations, too sedated when given with ativan  . Ibuprofen Hives, Nausea And Vomiting and Rash    Tolerates baby aspirin per patient.      REVIEW OF SYSTEMS:  (Positives checked otherwise negative)  CARDIOVASCULAR:  [ ]  chest pain, [ ]  chest pressure, [ ]  palpitations, [ ]  shortness of breath when laying flat, [ ]  shortness of breath with exertion,   [ x] pain in legs when walking, [ ]  pain in feet when laying flat, [ ]  history of blood clot in veins (DVT), [ ]  history of phlebitis, [x ] swelling in legs, [ ]  varicose veins  PULMONARY:  [ ]  productive cough, [ ]  asthma, [ ]  wheezing  NEUROLOGIC:  [x ] weakness in arms or right leg, [x ] numbness in arms or right leg, [ ]  difficulty speaking or slurred speech, [ ]  temporary loss of vision in one eye, [ ]  dizziness  HEMATOLOGIC:  [ ]  bleeding problems, [ ]  problems with blood clotting too easily  MUSCULOSKEL:  [ ]  joint pain, [ ]  joint swelling  GASTROINTEST:  [ ]   Vomiting blood, [ ]   Blood in stool     GENITOURINARY:  [ ]   Burning with urination, [ ]   Blood in urine  PSYCHIATRIC:  [ ]  history of major depression  INTEGUMENTARY:  [ ]  rashes, [ ]  ulcers  CONSTITUTIONAL:  [ ]  fever, [ ]  chills  For VQI Use Only  PRE-ADM LIVING: Home  AMB STATUS: Ambulatory  CAD Sx: None  PRIOR CHF: Moderate  STRESS TEST: [ ]  No, [x ] Normal, [ ]  + ischemia, [ ]  + MI, [ ]  Both  Physical Examination Filed Vitals:   12/10/13 1254  BP: 128/63  Pulse: 87  Resp: 16  Height: 5' 8.5" (1.74 m)  Weight: 214 lb (97.07 kg)  SpO2: 92%   Body mass index is 32.06 kg/(m^2).  General: A&O x 3, obese male   Head: [place]/AT  Ear/Nose/Throat: Hearing grossly intact, nares w/o erythema or drainage  Eyes: PERRLA, EOMI, Post surg chg to pupils  Neck: Supple, no nuchal rigidity, no palpable LAD. Limited flexion and extension of neck   Pulmonary: Sym exp, good air movt, CTAB, no rales, rhonchi, & wheezing  Cardiac: RRR,  Nl S1, S2, no Murmurs, rubs or gallops  Vascular: Vessel Right Left  Radial Palpable Palpable  Brachial  Palpable Palpable  Carotid Palpable, without bruit Palpable, without bruit  Aorta Not palpable N/A  Femoral Palpable Palpable  Popliteal Not palpable Not palpable  PT Not palpable Not palpable  DP Not palpable Not palpable   Gastrointestinal: soft, NTND, -G/R, - HSM, - masses,   Musculoskeletal: M/S 5/5 throughout. Limited rotation of neck. Extremities without ischemic changes.   Neurologic: CN 2-12 intact. Pain and light touch intact in extremities   Psychiatric: Judgment intact, Mood & affect appropriatefor pt's clinical situation, + MDD / + Bipolar / + Poor judgement  Dermatologic: See M/S exam for extremity exam. Hypertrophic nail changes and venous stasis changes on lower legs bilaterally. Scaling of plantar surface of feet bilaterally, more pronounced on right.    Outside Studies/Documentation outside documents were reviewed including: [facility] 10-30-13: ABI  R .48, L .88   Medical Decision Making  JSON KOELZER is a 68 y.o. male who presents with: right lower extremity intermittent claudication.   I discussed with the patient the natural history of intermittent claudication: 75% of patients have stable or improved symptoms in a year  an only 2% require amputation. Eventually 20% may require intervention in a year.  I discussed in depth with the patient the nature of atherosclerosis, and emphasized the importance of maximal medical management including strict control of blood pressure, blood glucose, and lipid levels, antiplatelet agent, obtaining regular exercise, and cessation of smoking.    The patient is aware that without maximal medical management the underlying atherosclerotic disease process will progress, limiting the benefit of any interventions. The patient is currently not on a statin.   The patient is currently on an  anti-platelet: aspirin.    He is also currently taking Eliquis for atrial fibrillation. He was advised to discontinue the medication three days prior to his arteriogram.   The patient will be scheduled for a right lower extremity arteriogram on 12/18/2013 with Dr. Trula Slade. Possible balloon angioplasty intervention was discussed.  Thank you for allowing [place] to participate in this patient's care  Virgina Jock, PA-C [facility] Office: [phone] Pager: [phone]  12/10/2013, 2:14 PM   I agree with the above.  The patient has lifestyle limiting claudication in the right leg.  Doppler studies revealed diminished blood flow.  He does not have active ulceration.  He has palpable femoral pulses.  I discussed with the patient the initial step would be diagnostic angiography.  This will be done through a left femoral approach.  L. plan on imaging the aorta and bilateral runoff.  L. intervene on the right leg if necessary.  This was extensively discussed with the patient.  He has successfully undergone cardiac catheterization in the past, however he cannot lie flat given his COPD.  This procedure has been scheduled for Tuesday, June 9.  He is Eliquis, which will need to be stopped prior to his procedure.  The patient lives by himself and will need to be monitored overnight.  Annamarie Major

## 2013-12-25 NOTE — Op Note (Signed)
Patient name: Lucas [name] MRN: [id number] DOB: 03/07/1946 Sex: male  12/25/2013 Pre-operative Diagnosis: Right leg claudication Post-operative diagnosis:  Same Surgeon:  Eldridge Abrahams Procedure Performed:  1.  ultrasound-guided access, left femoral artery  2.  abdominal aortogram  3.  right lower extremity runoff  4.  second order catheterization  5.  angioplasty right external iliac artery  6.  closure device (pro-glide)    Indications:  The patient comes in with lifestyle limiting right leg claudication.  This was documented by ultrasound.  He has been very difficult to 2 were: From a cath perspective by cardiology.  He has trouble lying flat and his legs continuously moved.  For that reason I had anesthesia involve.  This was done under MAC.  Procedure:  The patient was identified in the holding area and taken to room 8.  The patient was then placed supine on the table and prepped and draped in the usual sterile fashion.  A time out was called.  Ultrasound was used to evaluate the left common femoral artery.  It was patent .  A digital ultrasound image was acquired.  A micropuncture needle was used to access the left common femoral artery under ultrasound guidance.  An 018 wire was advanced without resistance and a micropuncture sheath was placed.  The 018 wire was removed and a benson wire was placed.  The micropuncture sheath was exchanged for a 5 french sheath.  An omniflush catheter was advanced over the wire to the level of L-1.  An abdominal angiogram was obtained.  Next, using the omniflush catheter and a benson wire, the aortic bifurcation was crossed and the catheter was placed into theright external iliac artery and right runoff was obtained.   Findings:   Aortogram:  The suprarenal abdominal aorta is widely patent without significant stenosis.  The right renal artery is widely patent.  The left renal artery is surgically absent.  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  Bilateral hypogastric arteries are patent.  The left external iliac artery is widely patent.  There is a high-grade stenosis, greater than 80% within the proximal right external iliac artery.  This was confirmed with pullback pressures.  There was a 30 mm gradient.  The remaining portion of the right external iliac artery is widely patent.  Right Lower Extremity:  The right common femoral artery is patent throughout it's course.  The profunda femoral artery is widely patent.  The right superficial femoral artery occludes just beyond its origin.  There is reconstitution of the above-knee popliteal artery down near the patella.  There is two-vessel runoff via the peroneal and posterior tibial artery.  Left Lower Extremity:  None evaluated today  Intervention:  After the above images were acquired, the decision was made to proceed with intervention.  Over a 035 Rosen wire, a 6 [place] Ansel 1 sheath was advanced into the right external iliac artery.  The patient was given systemic heparinization.  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  I selected a 6 x 40 chocolate balloon and perform balloon angioplasty at 10 atmospheres for 2 minutes.  Completion imaging studies revealed resolution of the stenosis with no evidence of dissection.  Catheters and wires were  removed.  I closed the right groin with a pro-glide device.  The cannulation site was hemostatic.  There were no immediate complications.  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 Annamarie Major, M.D. [facility] Office: [phone] Pager:  [phone]

## 2013-12-26 ENCOUNTER — Telehealth: Payer: Self-pay | Admitting: Surgery

## 2013-12-26 DIAGNOSIS — I70219 Atherosclerosis of native arteries of extremities with intermittent claudication, unspecified extremity: Secondary | ICD-10-CM | POA: Diagnosis not present

## 2013-12-26 NOTE — Significant Event (Signed)
1515-Patient came back to unit to pick up his charger that he left. Patient verbalized that has forgotten what took placed this morning in the hospital; stated not remembering what he was suppose to do after he goes home regarding medications or instructions and stated he "vaguely" remembered the visit with Dr. Trula Slade this morning now that he thought about it. Patient stated he is still unsure of what was done for him at this admission and felt concerns regarding cramps on the right leg. Patient stated he walked from his vehicle to the unit. RN made patient aware he is not to operate a vehicle per the discharge instruction.   RN called and spoke with Dr. Trula Slade, who stated that the surgery at this admission was on his right leg and will have follow up with patient to see if this intervention was helpful/improve for patient. Patient made aware of this and went over again when his follow-up appointment with Dr. Trula Slade is to take place.   RN went over the discharge instructions and follow-up appointments with patient again. Patient verbalized understanding at this time and expressed appreciation. Patient ambulated out of unit to his transportation.

## 2013-12-26 NOTE — Significant Event (Signed)
Patient taken to transportation for discharge to home by volunteer via wheelchair. Hyiu Ksor, Therapist, sports.

## 2013-12-26 NOTE — Progress Notes (Signed)
UR Completed Brenda Graves-Bigelow, RN,BSN [phone]  

## 2013-12-26 NOTE — Significant Event (Signed)
Patient voiced concerns over information that was given to him by Dr. Casimer Leek this morning, stating that he did not understand what the doctor had spoken with him when he said that his right leg was "100% occluded and that there no intervention done to that leg." RN paged MD Trula Slade who was in cath lab in a procedure to clarify information and for MD to speak with patient. RN also paged PA who in surgery at the time, who advised to call Dr. Trula Slade.   Staff who returned Dr. Trula Slade page stated that a message can be left for the doctor to see patient after he is done with procedure. RN relayed this message to patient. But patient stated he did not want to wait for the doctor to come see him; stated he wanted to go home now-and has refused to take his medications due at 10:00am. Patient stated that he will call Dr. Stephens Shire office.

## 2013-12-26 NOTE — Telephone Encounter (Addendum)
Message copied by Gena Fray on Wed Dec 26, 2013 11:15 AM ------      Message from: Peter Minium K      Created: Wed Dec 26, 2013  7:50 AM      Regarding: Schedule                   ----- Message -----         From: Ulyses Amor, PA-C         Sent: 12/26/2013   7:45 AM           To: [facility] Charge Pool            F/U in the office with Dr. Trula Slade in 2 weeks s/p angioplasty right LE angiogram. ------  12/26/13: lm for pt and mailed letter, dpm

## 2013-12-26 NOTE — Progress Notes (Signed)
[facility]  Subjective  - He reports still having right calf cramps.  He is going to walk in the halls this morning and see how he feels.   Objective 122/70 74 97.7 F (36.5 C) (Oral) 18 90%  Intake/Output Summary (Last 24 hours) at 12/26/13 0733 Last data filed at 12/26/13 0211  Gross per 24 hour  Intake   1680 ml  Output   1475 ml  Net    205 ml    Left groin site clean and dry Doppler PT signal right LE   Assessment/Planning: POD #1 Surgeon: Eldridge Abrahams  Procedure Performed:  1. ultrasound-guided access, left femoral artery  2. abdominal aortogram  3. right lower extremity runoff  4. second order catheterization  5. angioplasty right external iliac artery  6. closure device (pro-glide)    Plan to D/C home and have him f/u in 2 weeks with Dr. Fae Pippin, [place] 12/26/2013 7:33 AM --  Laboratory Lab Results:  Recent Labs  12/25/13 0626 12/25/13 1138  WBC  --  7.1  HGB 13.6 12.0*  HCT 40.0 37.2*  PLT  --  177   BMET  Recent Labs  12/25/13 0626 12/25/13 1138  NA 144 142  K 3.5* 3.7  CL 102 102  CO2  --  24  GLUCOSE 104* 89  BUN 22 22  CREATININE 1.30 1.20  CALCIUM  --  9.2    COAG Lab Results  Component Value Date   INR 1.17 12/25/2013   INR 1.06 10/16/2013   INR 1.2* 08/15/2013   No results found for this basename: PTT

## 2013-12-26 NOTE — Telephone Encounter (Addendum)
Message copied by Gena Fray on Wed Dec 26, 2013  1:19 PM ------      Message from: Peter Minium K      Created: Wed Dec 26, 2013 12:50 PM      Regarding: Schedule                   ----- Message -----         From: Ulyses Amor, PA-C         Sent: 12/26/2013  12:22 PM           To: Vvs Charge Pool            Dr. Trula Slade wants him to f/u in 3 months not 2 weeks thx [facility] ------  12/26/13: lm for pt re appt change, dpm

## 2013-12-26 NOTE — Discharge Summary (Signed)
[facility] Discharge Summary   Patient ID:  Lucas [name] MRN: [id number] DOB/AGE: [birth year] 69 y.o.  Admit date: 12/25/2013 Discharge date: 12/26/2013 Date of Surgery: 12/25/2013 Surgeon: Surgeon(s): Serafina Mitchell, MD  Admission Diagnosis: pvd  Discharge Diagnoses:  pvd  Secondary Diagnoses: Past Medical History  Diagnosis Date  . CAD (coronary artery disease)     a. s/p CABG x 5;  b. 04/2012 Cath: patent LIMA->LAD and VG->OM3, all other grafts occluded.  . Hypertension   . Anxiety   . History of kidney cancer   . Adjustment disorder with anxiety   . Hyperlipidemia   . BPH (benign prostatic hypertrophy)   . COPD (chronic obstructive pulmonary disease)     a. on home O2.  . Arthritis   . Ischemic cardiomyopathy     a. 06/2013 Echo: EF 30-35%, no reg wma's, Gr2 DD, triv AI, mod dil LA, nl RV fxn.  . Chronic systolic CHF (congestive heart failure)     a. 06/2013 Echo: EF 30-35%.  . ADENOCARCINOMA, PROSTATE dx'd 2012  . Renal cancer dx'd 1997    lt nephrectomy    Procedure(s): ABDOMINAL AORTAGRAM ANGIOGRAM EXTREMITY RIGHT PTA PERIPHERAL ARTERY  Discharged Condition: good  HPI: Lucas [name] is a 68 y.o. (08/27/1945) male who presents with chief complaint: right leg pain. Onset of symptom occurred three months ago. Pain is described as crampy, severity, and associated with exercise. He is able to ambulate for "about a minute," or less than a block.   He underwent abdominal aortogram and angioplasty right external iliac artery.  He still has calf pain secondary to cramping.  He is taking po's well, voiding and plans on ambulating.  He is stable and will f/u in 2 weeks.     Hospital Course:  Lucas [name] is a 68 y.o. male is S/P Right Procedure(s): ABDOMINAL AORTAGRAM ANGIOGRAM EXTREMITY RIGHT PTA PERIPHERAL ARTERY Extubated: POD # 0 Physical exam: Left groin site clean and dry  Doppler PT signal right LE  Post-op wounds healing  well Pt. Ambulating, voiding and taking PO diet without difficulty. Pt pain controlled with PO pain meds. Labs as below Complications:none  Consults:     Significant Diagnostic Studies: CBC Lab Results  Component Value Date   WBC 7.1 12/25/2013   HGB 12.0* 12/25/2013   HCT 37.2* 12/25/2013   MCV 91.6 12/25/2013   PLT 177 12/25/2013    BMET    Component Value Date/Time   NA 142 12/25/2013 1138   K 3.7 12/25/2013 1138   CL 102 12/25/2013 1138   CO2 24 12/25/2013 1138   GLUCOSE 89 12/25/2013 1138   BUN 22 12/25/2013 1138   CREATININE 1.20 12/25/2013 1138   CALCIUM 9.2 12/25/2013 1138   GFRNONAA 60* 12/25/2013 1138   GFRAA 70* 12/25/2013 1138   COAG Lab Results  Component Value Date   INR 1.17 12/25/2013   INR 1.06 10/16/2013   INR 1.2* 08/15/2013     Disposition:  Discharge to :Home Discharge Instructions   Call MD for:  redness, tenderness, or signs of infection (pain, swelling, bleeding, redness, odor or green/yellow discharge around incision site)    Complete by:  As directed      Call MD for:  severe or increased pain, loss or decreased feeling  in affected limb(s)    Complete by:  As directed      Call MD for:  temperature >100.5    Complete by:  As directed  Discharge instructions    Complete by:  As directed   You may remove the left groin dressing in 24 hours and shower with soap and water daily.     Driving Restrictions    Complete by:  As directed   No driving for 24 hours     Increase activity slowly    Complete by:  As directed   Walk with assistance use walker or cane as needed     Lifting restrictions    Complete by:  As directed   No lifting for 6 weeks     Resume previous diet    Complete by:  As directed             Medication List         albuterol (2.5 MG/3ML) 0.083% nebulizer solution  Commonly known as:  PROVENTIL  Take 2.5 mg by nebulization 4 (four) times daily.     albuterol 108 (90 BASE) MCG/ACT inhaler  Commonly known as:  PROVENTIL  HFA;VENTOLIN HFA  Inhale 2 puffs into the lungs every 6 (six) hours as needed for wheezing or shortness of breath. Shortness of breath     ALPRAZolam 1 MG tablet  Commonly known as:  XANAX  Take 1 tablet (1 mg total) by mouth 3 (three) times daily as needed for anxiety.     apixaban 5 MG Tabs tablet  Commonly known as:  ELIQUIS  Take 1 tablet (5 mg total) by mouth 2 (two) times daily.     aspirin EC 81 MG tablet  Take 81 mg by mouth daily.     ASSURE LANCETS Misc  1 Units by Does not apply route 2 (two) times daily.     doxazosin 8 MG tablet  Commonly known as:  CARDURA  Take 8 mg by mouth at bedtime.     furosemide 40 MG tablet  Commonly known as:  LASIX  Take 20 mg by mouth 2 (two) times daily.     HYDROcodone-acetaminophen 10-325 MG per tablet  Commonly known as:  NORCO  Take 1 tablet by mouth every 4 (four) hours as needed for moderate pain.     HYDROmorphone 4 MG tablet  Commonly known as:  DILAUDID  Take 4 mg by mouth 3 (three) times daily.     ipratropium 0.02 % nebulizer solution  Commonly known as:  ATROVENT  Take 0.5 mg by nebulization 4 (four) times daily.     Ipratropium-Albuterol 20-100 MCG/ACT Aers respimat  Commonly known as:  COMBIVENT RESPIMAT  Inhale 1 puff into the lungs every 6 (six) hours as needed for wheezing or shortness of breath.     mometasone-formoterol 100-5 MCG/ACT Aero  Commonly known as:  DULERA  Inhale 2 puffs into the lungs 2 (two) times daily.     NITROSTAT 0.3 MG SL tablet  Generic drug:  nitroGLYCERIN  Place 0.3 mg under the tongue every 5 (five) minutes as needed for chest pain.     NON FORMULARY  Place 2 L into the nose at bedtime. O2 at night only     valsartan 40 MG tablet  Commonly known as:  DIOVAN  Take 40 mg by mouth daily.       Verbal and written Discharge instructions given to the patient. Wound care per Discharge AVS     Follow-up Information   Follow up with Trula Slade IV, [place], MD In 2 weeks. (sent  message to office)    Specialty:  Vascular Surgery   Contact  information:   [address] [place] [address] [phone]       Signed: Laurence Slate [facility] 12/26/2013, 7:53 AM

## 2013-12-26 NOTE — Significant Event (Signed)
Went over the AVS with patient. Patient verbalized understanding of content. A copy of AVS given to patient. Awaiting for Dr. Trula Slade to see patient first before discharge.

## 2013-12-28 ENCOUNTER — Telehealth: Payer: Self-pay

## 2013-12-28 NOTE — Telephone Encounter (Signed)
Phone call from pt.  Reported he walked around [facility] and after about 15-20 minutes began having right calf and thigh pain.  Stated this is the same discomfort he had prior to the angiogram of 6/16.  Reported the symptoms relieved with rest.  Denies any rest pain.  Denies any open sores of right lower extremity.  Pt. Stated "I probably haven't given it enough time following the angiogram for the symptoms to improve.  Advised to continue to walk, but to gradually build up to walking longer distances.  Encouraged to call and report persistent claudication symptoms.  Pt. Verb. Understanding.

## 2013-12-31 ENCOUNTER — Encounter: Payer: Self-pay | Admitting: Internal Medicine

## 2013-12-31 ENCOUNTER — Other Ambulatory Visit: Payer: Medicare Other

## 2013-12-31 ENCOUNTER — Ambulatory Visit (INDEPENDENT_AMBULATORY_CARE_PROVIDER_SITE_OTHER): Payer: PRIVATE HEALTH INSURANCE | Admitting: Internal Medicine

## 2013-12-31 VITALS — BP 158/80 | HR 86 | Ht 69.0 in | Wt 218.4 lb

## 2013-12-31 DIAGNOSIS — J449 Chronic obstructive pulmonary disease, unspecified: Secondary | ICD-10-CM

## 2013-12-31 DIAGNOSIS — Z129 Encounter for screening for malignant neoplasm, site unspecified: Secondary | ICD-10-CM

## 2013-12-31 DIAGNOSIS — R0989 Other specified symptoms and signs involving the circulatory and respiratory systems: Secondary | ICD-10-CM

## 2013-12-31 DIAGNOSIS — R0602 Shortness of breath: Secondary | ICD-10-CM

## 2013-12-31 DIAGNOSIS — R0600 Dyspnea, unspecified: Secondary | ICD-10-CM

## 2013-12-31 DIAGNOSIS — R0609 Other forms of dyspnea: Secondary | ICD-10-CM

## 2013-12-31 DIAGNOSIS — R0689 Other abnormalities of breathing: Secondary | ICD-10-CM

## 2013-12-31 DIAGNOSIS — F17200 Nicotine dependence, unspecified, uncomplicated: Secondary | ICD-10-CM

## 2013-12-31 NOTE — Discharge Summary (Signed)
I agree with the above.  The patient will followup for reevaluation of his claudication symptoms in several weeks.  Annamarie Major

## 2013-12-31 NOTE — Progress Notes (Signed)
Subjective:    Patient ID: Lucas [name], male    DOB: Nov 18, 1945, 68 y.o.   MRN: [id number]  HPI   #Smoking  - quit sept 2014 but having withdrawals  #Moderate COPD Spirometry 04/18/12       06/02/12 FEV1 1.64L 46%             1.94  55%   FVC 2.78L 60%               3.01  65% FEV1/FVC 59                   64   #CHF/CAD - s/p CABG. C - Oct 8341 Systolic CHF admission ef 30% - Sept 9622: Diastolic CHF admission  - DEc 2013: EF 30% Admission for Unstable angiona but welll compensated CHF  #Obesity  - Body mass index is 33.15 kg/(m^2). on 08/31/2013 - Have been ruled out January 2015. Advised one liter of oxygen nocturnally    #AECOPD  - 03/26/13 - office Rx - 04/03/13 - AECOPD with Acute Diast CHF admission though 04/06/13 - Jan 2014 with doxy and pred in office with extended pred a week later  #imaging  - 04/03/13 CXR - CHF changes. NEver had CT -   OV 08/31/2013 Chief Complaint  Patient presents with  . COPD    follow-up. Pt states he has been having increased productive cough with yellow phlegm, increased SOB, chest congestion,  whezing and chest tightness x 3 days.   Followup COPD - Gold stage 2, DLCO 46% -  spet 2014,Full PFT   - At last visit mid January 2015 he had a COPD exacerbation with treated with doxycycline and prednisone. He improved after this. In the interim he had a cardiac cath this is listed below. However for the last 3 days is having increased cough, change in color of sputum, increasing wheeze and increased sputum volume. COPD cat score is in the 40s and reflects COPD exacerbation. He takes Combivent respimat which helps him but he does not take ANoro or breo because this does not help him. He is open to taking her steroids  New issue: He is open to having low-dose CT scan of the chest for lung cancer screening because Medicare is paying for it. However, our system not yet set up for this    Past, Family, Social reviewed: Since  last visit he has  had a cardiac catheterization that apparently was clean however, he does have right lower the claudication and apparently he has stenosis. He has a followup with cardiology pending. He is concerned that he might not be a revascularization candidate because of his COPD. In addition Dr. Elsworth Soho for sleep evaluation he tells me that he does not have sleep apnea; confirmed on electronic medical records dated 07/24/2013. His been advised one liters oxygen for sleep    #COPD exacerbation  - you are in flare up of copd again - Take prednisone 40 mg daily x 2 days, then 20mg  daily x 2 days, then 10mg  daily x 2 days, then 5mg  daily x 2 days and stop  take levaquin 500mg  once daily  X 6 days   #COPD Continue combivent respimat 4 times daily Start QVAR 66mcg, 2 puff twice daily Use albuterol 2 puff as needed No need for ANORO or BREO because this is not helping you Continue oxygen 18h/day  #Lung cancer screen  - do LDCT chest at next visit when we have systems in place  #  preop evaluation for vascular surgery  - I think you might be handle surgery for leg but at some risk; we will have to reassess this formally later  #FOllowuo Return to see my NP Tammy for med calendar - next several days to few weeks REturn to see me in 3 months  - spirometry at followujp      11/30/2013 Follow up  Returns for  3 month COPD follow up .  Reports is doing well.  Complains of hoarseness/sore throat .  Has been hospitalized 3x since last ov for CHF/SOB Most recent admit 5/6 for decompensated CHF w/ acute resp fail w/ hypoxia .  He was diuresis with decreased wt and swelling.  Followed closely by cards.  Says swelling is doing much better.   Currently on Dulera Twice daily  And Combivent Four times a day  And uses Duoneb As needed .  Remains on O2 at 2 l/m At bedtime  And with activity As needed   Denies any hemoptysis, fever, or orthopnea, PND, or increased leg swelling   OV 12/31/2013  Chief Complaint    Patient presents with  . Follow-up    Pt c/o dyspnea with exeriton and little ambulation. Pt states he had a blockage in his right calf and had a "balloon placed" last week. Pt c/o dry persistant cough and left chest pain with actvity.     Followup Gold stage II COPD and multiple medical problems  - COPD: Currently disease is stable. His inhaler regimen is weird but he wont change it. HE is on dulera and  combivent scheduled. Also on oi2 18h/day. Has class II dyspnea on exertion. Minimal cough only. His dyspnea is out of proportion to severity of copd and is associated with claudication but no chest pain  - Smoking: He thinks he quit smoking but he is actually smoking electronic cigarettes. I cautioned him that this is bad  - Lung cancer screening: Due to cost and insurance issues he has not had CT scan  - . CAT COPD Symptom & Quality of Life Score (GS[employer] trademark) 0 is no burden. 5 is highest burden 03/26/2013  04/23/2013  07/17/2013  08/31/2013 aecopd  Never Cough -> Cough all the time 4 2 3 5   No phlegm in chest -> Chest is full of phlegm 4 2 3 4   No chest tightness -> Chest feels very tight 3 2 3 4   No dyspnea for 1 flight stairs/hill -> Very dyspneic for 1 flight of stairs 4 4 4 4   No limitations for ADL at home -> Very limited with ADL at home 4 2 3 4   Confident leaving home -> Not at all confident leaving home 2 2 2 4   Sleep soundly -> Do not sleep soundly because of lung condition 4 3 3 4   Lots of Energy -> No energy at all 4 4 4 5   TOTAL Score (max 40)  29 21 25  34   Past medical history reviewed: He continues to have claudication despite balloon placement in his femoral artery according to his history. Dyspnea and claudication occurs at the same time. He also has chronic pain but does not have a pain clinic. Apparently in the hospital Dilaudid helped him and he wants this   Review of Systems  Constitutional: Negative for fever and unexpected weight change.  HENT: Negative for  congestion, dental problem, ear pain, nosebleeds, postnasal drip, rhinorrhea, sinus pressure, sneezing, sore throat and trouble swallowing.   Eyes: Negative for redness  and itching.  Respiratory: Positive for cough and shortness of breath. Negative for chest tightness and wheezing.   Cardiovascular: Positive for chest pain. Negative for palpitations and leg swelling.  Gastrointestinal: Negative for nausea and vomiting.  Genitourinary: Negative for dysuria.  Musculoskeletal: Negative for joint swelling.  Skin: Negative for rash.  Neurological: Negative for headaches.  Hematological: Does not bruise/bleed easily.  Psychiatric/Behavioral: Negative for dysphoric mood. The patient is not nervous/anxious.        Objective:   Physical Exam Filed Vitals:   12/31/13 0909  BP: 158/80  Pulse: 86  Height: 5\' 9"  (1.753 m)  Weight: 218 lb 6.4 oz (99.066 kg)  SpO2: 96%     HENT:  Head: Normocephalic and atraumatic.  Right Ear: External ear normal.  Left Ear: External ear normal.  Mouth/Throat: Oropharynx is clear and moist. No oropharyngeal exudate.  mallampatti class 4, STIFF NECK DUE TO NECK SURGEERY IN PAST  Eyes: Conjunctivae and EOM are normal. Pupils are equal, round, and reactive to light. Right eye exhibits no discharge. Left eye exhibits no discharge. No scleral icterus.  Neck: Normal range of motion. Neck supple. No JVD present. No tracheal deviation present. No thyromegaly present.  Cardiovascular: Normal rate, regular rhythm and intact distal pulses.  Exam reveals no gallop and no friction rub.   No murmur heard. Pulmonary/Chest: Effort normal. No respiratory distress. He has NO WHEEZE THIS VISIT He has no rales. He exhibits no tenderness.  Abdominal: Soft. Bowel sounds are normal. He exhibits no distension and no mass. There is no tenderness. There is no rebound and no guarding.  Musculoskeletal: Normal range of motion. He exhibits no edema and no tenderness.  Lymphadenopathy:     He has no cervical adenopathy.  Neurological: He is alert and oriented to person, place, and time. He has normal reflexes. No cranial nerve deficit. Coordination normal.           Assessment & Plan:  #COPD Currently disease is stable Check alpha 1 phenotype; genetic test for copd Continue combivent respimat 4 times daily Continue Dulera 2 puff twice daily Use duoneb as needed Continue oxygen 18h/day Refer pulmonary rehab  #Shortness of breath out of proportion   - High Resolution CT chest without contrast on ILD protocol. Only  Dr Lorin Picket or Dr. Vinnie Langton to read  #SMoking  - vape is bad. Please quit  #CHronic pain  - talk to Salena Saner., MD and get referred to pain clinic   #FOllowup REturn to see me or NP in 3 months

## 2013-12-31 NOTE — Patient Instructions (Addendum)
#  COPD Currently disease is stable Check alpha 1 phenotype; genetic test for copd Continue combivent respimat 4 times daily Continue Dulera 2 puff twice daily Use duoneb as needed Continue oxygen 18h/day Refer pulmonary rehab  #Shortness of breath out of proportion   - High Resolution CT chest without contrast on ILD protocol. Only  Dr Lorin Picket or Dr. Vinnie Langton to read  #SMoking  - vape is bad. Please quit  #CHronic pain  - talk to Salena Saner., MD and get referred to pain clinic   #FOllowup REturn to see me or NP in 3 months

## 2014-01-04 ENCOUNTER — Ambulatory Visit
Admission: RE | Admit: 2014-01-04 | Discharge: 2014-01-04 | Disposition: A | Discharge status: Home / Self Care | Payer: Medicare Other | Source: Ambulatory Visit | Attending: Internal Medicine | Admitting: Internal Medicine

## 2014-01-04 DIAGNOSIS — J449 Chronic obstructive pulmonary disease, unspecified: Secondary | ICD-10-CM

## 2014-01-04 DIAGNOSIS — R0689 Other abnormalities of breathing: Secondary | ICD-10-CM

## 2014-01-04 DIAGNOSIS — R0600 Dyspnea, unspecified: Secondary | ICD-10-CM

## 2014-01-04 NOTE — Telephone Encounter (Signed)
AWAITING  ON  RETURN CALL FROM STEPHANIE./CY

## 2014-01-06 ENCOUNTER — Inpatient Hospital Stay (HOSPITAL_COMMUNITY)
Admission: EM | Admit: 2014-01-06 | Discharge: 2014-01-09 | DRG: 191 | Disposition: A | Discharge status: Home / Self Care | Payer: PRIVATE HEALTH INSURANCE | Attending: Internal Medicine | Admitting: Internal Medicine

## 2014-01-06 DIAGNOSIS — J441 Chronic obstructive pulmonary disease with (acute) exacerbation: Principal | ICD-10-CM | POA: Diagnosis present

## 2014-01-06 DIAGNOSIS — I70219 Atherosclerosis of native arteries of extremities with intermittent claudication, unspecified extremity: Secondary | ICD-10-CM

## 2014-01-06 DIAGNOSIS — I480 Paroxysmal atrial fibrillation: Secondary | ICD-10-CM

## 2014-01-06 DIAGNOSIS — I739 Peripheral vascular disease, unspecified: Secondary | ICD-10-CM | POA: Diagnosis present

## 2014-01-06 DIAGNOSIS — I2589 Other forms of chronic ischemic heart disease: Secondary | ICD-10-CM | POA: Diagnosis present

## 2014-01-06 DIAGNOSIS — I5033 Acute on chronic diastolic (congestive) heart failure: Secondary | ICD-10-CM

## 2014-01-06 DIAGNOSIS — Z951 Presence of aortocoronary bypass graft: Secondary | ICD-10-CM

## 2014-01-06 DIAGNOSIS — I2510 Atherosclerotic heart disease of native coronary artery without angina pectoris: Secondary | ICD-10-CM | POA: Diagnosis present

## 2014-01-06 DIAGNOSIS — D649 Anemia, unspecified: Secondary | ICD-10-CM | POA: Diagnosis present

## 2014-01-06 DIAGNOSIS — J9620 Acute and chronic respiratory failure, unspecified whether with hypoxia or hypercapnia: Secondary | ICD-10-CM

## 2014-01-06 DIAGNOSIS — F4322 Adjustment disorder with anxiety: Secondary | ICD-10-CM

## 2014-01-06 DIAGNOSIS — N181 Chronic kidney disease, stage 1: Secondary | ICD-10-CM

## 2014-01-06 DIAGNOSIS — I4891 Unspecified atrial fibrillation: Secondary | ICD-10-CM | POA: Diagnosis present

## 2014-01-06 DIAGNOSIS — R079 Chest pain, unspecified: Secondary | ICD-10-CM

## 2014-01-06 DIAGNOSIS — Z85528 Personal history of other malignant neoplasm of kidney: Secondary | ICD-10-CM

## 2014-01-06 DIAGNOSIS — E785 Hyperlipidemia, unspecified: Secondary | ICD-10-CM | POA: Diagnosis present

## 2014-01-06 DIAGNOSIS — Z9981 Dependence on supplemental oxygen: Secondary | ICD-10-CM

## 2014-01-06 DIAGNOSIS — I10 Essential (primary) hypertension: Secondary | ICD-10-CM | POA: Diagnosis present

## 2014-01-06 DIAGNOSIS — Z87891 Personal history of nicotine dependence: Secondary | ICD-10-CM

## 2014-01-06 DIAGNOSIS — F411 Generalized anxiety disorder: Secondary | ICD-10-CM | POA: Diagnosis present

## 2014-01-06 DIAGNOSIS — J449 Chronic obstructive pulmonary disease, unspecified: Secondary | ICD-10-CM

## 2014-01-06 DIAGNOSIS — I5022 Chronic systolic (congestive) heart failure: Secondary | ICD-10-CM

## 2014-01-06 DIAGNOSIS — J9621 Acute and chronic respiratory failure with hypoxia: Secondary | ICD-10-CM

## 2014-01-06 DIAGNOSIS — Z8546 Personal history of malignant neoplasm of prostate: Secondary | ICD-10-CM

## 2014-01-06 DIAGNOSIS — Z833 Family history of diabetes mellitus: Secondary | ICD-10-CM

## 2014-01-06 DIAGNOSIS — Z8249 Family history of ischemic heart disease and other diseases of the circulatory system: Secondary | ICD-10-CM

## 2014-01-06 DIAGNOSIS — I482 Chronic atrial fibrillation, unspecified: Secondary | ICD-10-CM

## 2014-01-06 DIAGNOSIS — E669 Obesity, unspecified: Secondary | ICD-10-CM

## 2014-01-06 DIAGNOSIS — R918 Other nonspecific abnormal finding of lung field: Secondary | ICD-10-CM | POA: Diagnosis present

## 2014-01-06 DIAGNOSIS — Z9861 Coronary angioplasty status: Secondary | ICD-10-CM

## 2014-01-06 DIAGNOSIS — I509 Heart failure, unspecified: Secondary | ICD-10-CM | POA: Diagnosis present

## 2014-01-06 DIAGNOSIS — G8929 Other chronic pain: Secondary | ICD-10-CM | POA: Diagnosis present

## 2014-01-06 DIAGNOSIS — Z905 Acquired absence of kidney: Secondary | ICD-10-CM

## 2014-01-06 DIAGNOSIS — I255 Ischemic cardiomyopathy: Secondary | ICD-10-CM | POA: Diagnosis present

## 2014-01-06 NOTE — Assessment & Plan Note (Signed)
Explained vape and e-cig is bad and he needs to quit

## 2014-01-06 NOTE — ED Notes (Signed)
Bed: AN19 Expected date:  Expected time:  Means of arrival:  Comments: EMS chest pain with significant cardiac history

## 2014-01-06 NOTE — Assessment & Plan Note (Signed)
Has dyspnea out of proportion to COPD gold 2 and can easily be explained by his CKD, A Fib, obesity, CAD. Still will get CT chest to ensure no ILD or other abnormalities. He is agreeable

## 2014-01-06 NOTE — Assessment & Plan Note (Signed)
#  COPD Currently disease is stable Check alpha 1 phenotype; genetic test for copd Continue combivent respimat 4 times daily Continue Dulera 2 puff twice daily Use duoneb as needed Continue oxygen 18h/day Refer pulmonary rehab   #CHronic pain  - talk to Salena Saner., MD and get referred to pain clinic   #FOllowup REturn to see me or NP in 3 months

## 2014-01-06 NOTE — Assessment & Plan Note (Signed)
#  COPD Currently disease is stable Check alpha 1 phenotype; genetic test for copd Continue combivent respimat 4 times daily Continue Dulera 2 puff twice daily Use duoneb as needed Continue oxygen 18h/day Refer pulmonary rehab

## 2014-01-06 NOTE — ED Notes (Signed)
Patient has been having chest for a couple of hours, centralized pain with tightness of chest. Patient has taken 3 nitro and 4 x 81mg  baby asprin before arrival. EMS has given two nitro, 3 liters of O2, and 12 lead. PiV #18 Right AC.

## 2014-01-06 NOTE — Assessment & Plan Note (Signed)
HRCT for dyspnea will help ; await HRCT

## 2014-01-07 ENCOUNTER — Inpatient Hospital Stay: Admission: RE | Admit: 2014-01-07 | Payer: Medicare Other | Source: Ambulatory Visit

## 2014-01-07 ENCOUNTER — Encounter (HOSPITAL_COMMUNITY): Payer: Self-pay | Admitting: Emergency Medicine

## 2014-01-07 ENCOUNTER — Emergency Department (HOSPITAL_COMMUNITY): Payer: PRIVATE HEALTH INSURANCE

## 2014-01-07 ENCOUNTER — Encounter: Payer: PRIVATE HEALTH INSURANCE | Admitting: Surgery

## 2014-01-07 DIAGNOSIS — E669 Obesity, unspecified: Secondary | ICD-10-CM

## 2014-01-07 DIAGNOSIS — I70219 Atherosclerosis of native arteries of extremities with intermittent claudication, unspecified extremity: Secondary | ICD-10-CM

## 2014-01-07 DIAGNOSIS — G8929 Other chronic pain: Secondary | ICD-10-CM | POA: Diagnosis present

## 2014-01-07 DIAGNOSIS — R079 Chest pain, unspecified: Secondary | ICD-10-CM

## 2014-01-07 DIAGNOSIS — I509 Heart failure, unspecified: Secondary | ICD-10-CM

## 2014-01-07 DIAGNOSIS — I739 Peripheral vascular disease, unspecified: Secondary | ICD-10-CM

## 2014-01-07 DIAGNOSIS — I5043 Acute on chronic combined systolic (congestive) and diastolic (congestive) heart failure: Secondary | ICD-10-CM

## 2014-01-07 DIAGNOSIS — I2589 Other forms of chronic ischemic heart disease: Secondary | ICD-10-CM

## 2014-01-07 DIAGNOSIS — I4891 Unspecified atrial fibrillation: Secondary | ICD-10-CM

## 2014-01-07 DIAGNOSIS — J441 Chronic obstructive pulmonary disease with (acute) exacerbation: Principal | ICD-10-CM

## 2014-01-07 DIAGNOSIS — N181 Chronic kidney disease, stage 1: Secondary | ICD-10-CM

## 2014-01-07 DIAGNOSIS — J9620 Acute and chronic respiratory failure, unspecified whether with hypoxia or hypercapnia: Secondary | ICD-10-CM

## 2014-01-07 LAB — CBC WITH DIFFERENTIAL/PLATELET
Basophils Absolute: 0 10*3/uL (ref 0.0–0.1)
Basophils Absolute: 0 10*3/uL (ref 0.0–0.1)
Basophils Relative: 0 % (ref 0–1)
Basophils Relative: 0 % (ref 0–1)
EOS ABS: 0 10*3/uL (ref 0.0–0.7)
Eosinophils Absolute: 0.1 10*3/uL (ref 0.0–0.7)
Eosinophils Relative: 0 % (ref 0–5)
Eosinophils Relative: 1 % (ref 0–5)
HCT: 37.6 % — ABNORMAL LOW (ref 39.0–52.0)
HCT: 39 % (ref 39.0–52.0)
HEMOGLOBIN: 12.9 g/dL — AB (ref 13.0–17.0)
Hemoglobin: 12.4 g/dL — ABNORMAL LOW (ref 13.0–17.0)
LYMPHS ABS: 1.1 10*3/uL (ref 0.7–4.0)
LYMPHS PCT: 11 % — AB (ref 12–46)
Lymphocytes Relative: 12 % (ref 12–46)
Lymphs Abs: 0.9 10*3/uL (ref 0.7–4.0)
MCH: 30.1 pg (ref 26.0–34.0)
MCH: 30.2 pg (ref 26.0–34.0)
MCHC: 33 g/dL (ref 30.0–36.0)
MCHC: 33.1 g/dL (ref 30.0–36.0)
MCV: 90.9 fL (ref 78.0–100.0)
MCV: 91.7 fL (ref 78.0–100.0)
MONOS PCT: 6 % (ref 3–12)
Monocytes Absolute: 0.5 10*3/uL (ref 0.1–1.0)
Monocytes Absolute: 0.6 10*3/uL (ref 0.1–1.0)
Monocytes Relative: 6 % (ref 3–12)
NEUTROS ABS: 6.8 10*3/uL (ref 1.7–7.7)
NEUTROS ABS: 7.3 10*3/uL (ref 1.7–7.7)
NEUTROS PCT: 82 % — AB (ref 43–77)
Neutrophils Relative %: 82 % — ABNORMAL HIGH (ref 43–77)
PLATELETS: 150 10*3/uL (ref 150–400)
Platelets: 159 10*3/uL (ref 150–400)
RBC: 4.1 MIL/uL — ABNORMAL LOW (ref 4.22–5.81)
RBC: 4.29 MIL/uL (ref 4.22–5.81)
RDW: 16.5 % — ABNORMAL HIGH (ref 11.5–15.5)
RDW: 16.6 % — ABNORMAL HIGH (ref 11.5–15.5)
WBC: 8.3 10*3/uL (ref 4.0–10.5)
WBC: 9 10*3/uL (ref 4.0–10.5)

## 2014-01-07 LAB — BASIC METABOLIC PANEL
BUN: 27 mg/dL — ABNORMAL HIGH (ref 6–23)
BUN: 29 mg/dL — ABNORMAL HIGH (ref 6–23)
CO2: 21 meq/L (ref 19–32)
CO2: 22 meq/L (ref 19–32)
Calcium: 8.9 mg/dL (ref 8.4–10.5)
Calcium: 9.1 mg/dL (ref 8.4–10.5)
Chloride: 102 mEq/L (ref 96–112)
Chloride: 102 mEq/L (ref 96–112)
Creatinine, Ser: 1.25 mg/dL (ref 0.50–1.35)
Creatinine, Ser: 1.27 mg/dL (ref 0.50–1.35)
GFR calc Af Amer: 65 mL/min — ABNORMAL LOW (ref >90)
GFR calc Af Amer: 67 mL/min — ABNORMAL LOW (ref >90)
GFR, EST NON AFRICAN AMERICAN: 56 mL/min — AB (ref >90)
GFR, EST NON AFRICAN AMERICAN: 57 mL/min — AB (ref >90)
GLUCOSE: 118 mg/dL — AB (ref 70–99)
GLUCOSE: 120 mg/dL — AB (ref 70–99)
POTASSIUM: 4.4 meq/L (ref 3.7–5.3)
POTASSIUM: 4.6 meq/L (ref 3.7–5.3)
Sodium: 139 mEq/L (ref 137–147)
Sodium: 140 mEq/L (ref 137–147)

## 2014-01-07 LAB — COMPREHENSIVE METABOLIC PANEL
ALT: 14 U/L (ref 0–53)
AST: 23 U/L (ref 0–37)
Albumin: 3.9 g/dL (ref 3.5–5.2)
Alkaline Phosphatase: 52 U/L (ref 39–117)
BUN: 30 mg/dL — ABNORMAL HIGH (ref 6–23)
CALCIUM: 8.7 mg/dL (ref 8.4–10.5)
CO2: 23 meq/L (ref 19–32)
Chloride: 101 mEq/L (ref 96–112)
Creatinine, Ser: 1.34 mg/dL (ref 0.50–1.35)
GFR calc Af Amer: 61 mL/min — ABNORMAL LOW (ref >90)
GFR, EST NON AFRICAN AMERICAN: 53 mL/min — AB (ref >90)
Glucose, Bld: 115 mg/dL — ABNORMAL HIGH (ref 70–99)
POTASSIUM: 4.6 meq/L (ref 3.7–5.3)
Sodium: 141 mEq/L (ref 137–147)
Total Bilirubin: 0.3 mg/dL (ref 0.3–1.2)
Total Protein: 7.5 g/dL (ref 6.0–8.3)

## 2014-01-07 LAB — PRO B NATRIURETIC PEPTIDE: Pro B Natriuretic peptide (BNP): 1118 pg/mL — ABNORMAL HIGH (ref 0–125)

## 2014-01-07 LAB — APTT
APTT: 56 s — AB (ref 24–37)
aPTT: 53 seconds — ABNORMAL HIGH (ref 24–37)

## 2014-01-07 LAB — GLUCOSE, CAPILLARY
GLUCOSE-CAPILLARY: 109 mg/dL — AB (ref 70–99)
GLUCOSE-CAPILLARY: 173 mg/dL — AB (ref 70–99)
Glucose-Capillary: 96 mg/dL (ref 70–99)
Glucose-Capillary: 229 mg/dL — ABNORMAL HIGH (ref 70–99)

## 2014-01-07 LAB — TROPONIN I
Troponin I: <0.3 ng/mL (ref <0.30)
Troponin I: <0.3 ng/mL (ref <0.30)
Troponin I: <0.3 ng/mL (ref <0.30)
Troponin I: <0.3 ng/mL (ref <0.30)

## 2014-01-07 LAB — HEPARIN LEVEL (UNFRACTIONATED)
Heparin Unfractionated: 0.46 IU/mL (ref 0.30–0.70)
Heparin Unfractionated: 0.39 IU/mL (ref 0.30–0.70)

## 2014-01-07 LAB — ALPHA-1 ANTITRYPSIN PHENOTYPE: A-1 Antitrypsin: 155 mg/dL (ref 83–199)

## 2014-01-07 MED ORDER — IRBESARTAN 75 MG PO TABS
37.5000 mg | ORAL_TABLET | Freq: Every day | ORAL | Status: DC
  Administered 2014-01-07 – 2014-01-09 (×3): 37.5 mg via ORAL
  Filled 2014-01-07 (×3): qty 0.5

## 2014-01-07 MED ORDER — FENTANYL CITRATE 0.05 MG/ML IJ SOLN
50.0000 ug | Freq: Once | INTRAMUSCULAR | Status: AC
  Administered 2014-01-07: 50 ug via INTRAVENOUS
  Filled 2014-01-07: qty 2

## 2014-01-07 MED ORDER — ALBUTEROL SULFATE (2.5 MG/3ML) 0.083% IN NEBU: 2.5000 mg | INHALATION_SOLUTION | RESPIRATORY_TRACT | Status: DC

## 2014-01-07 MED ORDER — IPRATROPIUM-ALBUTEROL 0.5-2.5 (3) MG/3ML IN SOLN: 3.0000 mL | Freq: Four times a day (QID) | RESPIRATORY_TRACT | Status: DC

## 2014-01-07 MED ORDER — ACETAMINOPHEN 650 MG RE SUPP: 650.0000 mg | Freq: Four times a day (QID) | RECTAL | Status: DC | PRN

## 2014-01-07 MED ORDER — BUDESONIDE 0.25 MG/2ML IN SUSP
0.2500 mg | Freq: Two times a day (BID) | RESPIRATORY_TRACT | Status: DC
  Administered 2014-01-07 – 2014-01-09 (×5): 0.25 mg via RESPIRATORY_TRACT
  Filled 2014-01-07 (×9): qty 2

## 2014-01-07 MED ORDER — SODIUM CHLORIDE 0.9 % IJ SOLN
3.0000 mL | Freq: Two times a day (BID) | INTRAMUSCULAR | Status: DC
  Administered 2014-01-07 – 2014-01-09 (×3): 3 mL via INTRAVENOUS

## 2014-01-07 MED ORDER — ASPIRIN EC 325 MG PO TBEC
325.0000 mg | DELAYED_RELEASE_TABLET | Freq: Every day | ORAL | Status: DC
  Administered 2014-01-07 – 2014-01-08 (×2): 325 mg via ORAL
  Filled 2014-01-07 (×3): qty 1

## 2014-01-07 MED ORDER — HEPARIN BOLUS VIA INFUSION
4000.0000 [IU] | Freq: Once | INTRAVENOUS | Status: AC
  Administered 2014-01-07: 4000 [IU] via INTRAVENOUS
  Filled 2014-01-07: qty 4000

## 2014-01-07 MED ORDER — SODIUM CHLORIDE 0.9 % IJ SOLN
10.0000 mL | INTRAMUSCULAR | Status: DC | PRN
  Administered 2014-01-07 (×4): 10 mL via INTRAVENOUS

## 2014-01-07 MED ORDER — IPRATROPIUM-ALBUTEROL 0.5-2.5 (3) MG/3ML IN SOLN: 3.0000 mL | RESPIRATORY_TRACT | Status: DC | PRN

## 2014-01-07 MED ORDER — IPRATROPIUM-ALBUTEROL 0.5-2.5 (3) MG/3ML IN SOLN: 3.0000 mL | RESPIRATORY_TRACT | Status: DC

## 2014-01-07 MED ORDER — IOHEXOL 350 MG/ML SOLN
100.0000 mL | Freq: Once | INTRAVENOUS | Status: AC | PRN
  Administered 2014-01-07: 100 mL via INTRAVENOUS

## 2014-01-07 MED ORDER — HYDROMORPHONE HCL PF 1 MG/ML IJ SOLN
1.0000 mg | INTRAMUSCULAR | Status: DC | PRN
  Administered 2014-01-07 (×2): 1 mg via INTRAVENOUS
  Filled 2014-01-07 (×2): qty 1

## 2014-01-07 MED ORDER — ACETAMINOPHEN 325 MG PO TABS: 650.0000 mg | ORAL_TABLET | Freq: Four times a day (QID) | ORAL | Status: DC | PRN

## 2014-01-07 MED ORDER — SODIUM CHLORIDE 0.9 % IJ SOLN
3.0000 mL | Freq: Two times a day (BID) | INTRAMUSCULAR | Status: DC
  Administered 2014-01-07 – 2014-01-08 (×3): 3 mL via INTRAVENOUS

## 2014-01-07 MED ORDER — IPRATROPIUM BROMIDE 0.02 % IN SOLN: 0.5000 mg | Freq: Four times a day (QID) | RESPIRATORY_TRACT | Status: DC

## 2014-01-07 MED ORDER — HEPARIN (PORCINE) IN NACL 100-0.45 UNIT/ML-% IJ SOLN
1150.0000 [IU]/h | INTRAMUSCULAR | Status: DC
  Administered 2014-01-07 (×2): 1150 [IU]/h via INTRAVENOUS
  Filled 2014-01-07 (×3): qty 250

## 2014-01-07 MED ORDER — IPRATROPIUM-ALBUTEROL 0.5-2.5 (3) MG/3ML IN SOLN
3.0000 mL | Freq: Once | RESPIRATORY_TRACT | Status: AC
  Administered 2014-01-07: 3 mL via RESPIRATORY_TRACT
  Filled 2014-01-07: qty 3

## 2014-01-07 MED ORDER — SODIUM CHLORIDE 0.9 % IJ SOLN
3.0000 mL | Freq: Two times a day (BID) | INTRAMUSCULAR | Status: DC
  Administered 2014-01-07 – 2014-01-08 (×2): 3 mL via INTRAVENOUS

## 2014-01-07 MED ORDER — ALPRAZOLAM 1 MG PO TABS
1.0000 mg | ORAL_TABLET | Freq: Three times a day (TID) | ORAL | Status: DC | PRN
  Administered 2014-01-08 – 2014-01-09 (×4): 1 mg via ORAL
  Filled 2014-01-07 (×4): qty 1

## 2014-01-07 MED ORDER — ONDANSETRON HCL 4 MG/2ML IJ SOLN: 4.0000 mg | Freq: Four times a day (QID) | INTRAMUSCULAR | Status: DC | PRN

## 2014-01-07 MED ORDER — ONDANSETRON HCL 4 MG/2ML IJ SOLN
8.0000 mg | Freq: Once | INTRAMUSCULAR | Status: AC
  Administered 2014-01-07: 8 mg via INTRAVENOUS
  Filled 2014-01-07: qty 4

## 2014-01-07 MED ORDER — NITROGLYCERIN IN D5W 200-5 MCG/ML-% IV SOLN
5.0000 ug/min | INTRAVENOUS | Status: DC
  Administered 2014-01-07: 5 ug/min via INTRAVENOUS
  Filled 2014-01-07: qty 250

## 2014-01-07 MED ORDER — ALPRAZOLAM 0.5 MG PO TABS
1.0000 mg | ORAL_TABLET | Freq: Once | ORAL | Status: AC
  Administered 2014-01-07: 1 mg via ORAL
  Filled 2014-01-07: qty 2

## 2014-01-07 MED ORDER — IPRATROPIUM-ALBUTEROL 0.5-2.5 (3) MG/3ML IN SOLN
3.0000 mL | Freq: Four times a day (QID) | RESPIRATORY_TRACT | Status: DC
  Administered 2014-01-07 – 2014-01-09 (×8): 3 mL via RESPIRATORY_TRACT
  Filled 2014-01-07 (×8): qty 3

## 2014-01-07 MED ORDER — FUROSEMIDE 10 MG/ML IJ SOLN
20.0000 mg | Freq: Two times a day (BID) | INTRAMUSCULAR | Status: DC
Start: 2014-01-07 — End: 2014-01-09
  Administered 2014-01-07 – 2014-01-09 (×5): 20 mg via INTRAVENOUS
  Filled 2014-01-07 (×7): qty 2

## 2014-01-07 MED ORDER — ONDANSETRON HCL 4 MG PO TABS: 4.0000 mg | ORAL_TABLET | Freq: Four times a day (QID) | ORAL | Status: DC | PRN

## 2014-01-07 MED ORDER — FENTANYL CITRATE 0.05 MG/ML IJ SOLN
INTRAMUSCULAR | Status: AC
  Administered 2014-01-07: 50 ug via INTRAVENOUS
  Filled 2014-01-07: qty 2

## 2014-01-07 MED ORDER — FUROSEMIDE 10 MG/ML IJ SOLN
40.0000 mg | Freq: Once | INTRAMUSCULAR | Status: AC
  Administered 2014-01-07: 40 mg via INTRAVENOUS
  Filled 2014-01-07: qty 4

## 2014-01-07 MED ORDER — METHYLPREDNISOLONE SODIUM SUCC 125 MG IJ SOLR
60.0000 mg | Freq: Four times a day (QID) | INTRAMUSCULAR | Status: DC
  Administered 2014-01-07 – 2014-01-09 (×8): 60 mg via INTRAVENOUS
  Filled 2014-01-07 (×13): qty 0.96

## 2014-01-07 MED ORDER — ISOSORBIDE MONONITRATE ER 30 MG PO TB24
30.0000 mg | ORAL_TABLET | Freq: Every day | ORAL | Status: DC
  Administered 2014-01-09: 30 mg via ORAL
  Filled 2014-01-07 (×3): qty 1

## 2014-01-07 MED ORDER — FENTANYL CITRATE 0.05 MG/ML IJ SOLN
100.0000 ug | Freq: Once | INTRAMUSCULAR | Status: AC
  Administered 2014-01-07: 100 ug via INTRAVENOUS
  Administered 2014-01-07: 50 ug via INTRAVENOUS
  Filled 2014-01-07: qty 2

## 2014-01-07 MED ORDER — DOXAZOSIN MESYLATE 8 MG PO TABS
8.0000 mg | ORAL_TABLET | Freq: Every day | ORAL | Status: DC
  Administered 2014-01-07 – 2014-01-08 (×2): 8 mg via ORAL
  Filled 2014-01-07 (×3): qty 1

## 2014-01-07 MED ORDER — ALBUTEROL SULFATE (2.5 MG/3ML) 0.083% IN NEBU
2.5000 mg | INHALATION_SOLUTION | RESPIRATORY_TRACT | Status: DC | PRN
  Administered 2014-01-07: 2.5 mg via RESPIRATORY_TRACT
  Filled 2014-01-07: qty 3

## 2014-01-07 MED ORDER — FENTANYL CITRATE 0.05 MG/ML IJ SOLN
12.5000 ug | INTRAMUSCULAR | Status: DC | PRN
  Administered 2014-01-07 – 2014-01-08 (×6): 12.5 ug via INTRAVENOUS
  Filled 2014-01-07 (×6): qty 2

## 2014-01-07 NOTE — Plan of Care (Signed)
Problem: Phase I Progression Outcomes Goal: EF % per last Echo/documented,Core Reminder form on chart Outcome: Completed/Met Date Met:  01/07/14 40-45%

## 2014-01-07 NOTE — ED Provider Notes (Signed)
CSN: [id number]     Arrival date & time 01/06/14  2345 History   First MD Initiated Contact with Patient 01/07/14 0003     Chief Complaint  Patient presents with  . Chest Pain     (Consider location/radiation/quality/duration/timing/severity/associated sxs/prior Treatment) HPI 68 year old male presents to the emergency department from home via EMS with complaint of chest pain.  Patient noted some chest pain this morning as he was walking down the stairs.  He reports tonight as he was walking upstairs he had central chest pain radiating up into his neck and around his left chest.  Patient reports he had shortness of breath and tightness associated with pain.  Patient reports no improvement with nitroglycerin and full dose aspirin.  Patient has history of coronary disease status post CABG remotely, cath in 2013.  Patient has history of ischemic cardiomyopathy, CHF, COPD.  Patient reports he recently had angioplasty done to his right leg by vascular surgery secondary to claudication.  Patient reports he is recommended to have a CT scan done on Friday, but could not secondary to cost phobia.  He denies any fevers or chills.  He reports chronic cough no change.  Patient refused EMS transfer to, secondary to not liking nurses and a specific doctor at [facility].  He is followed by Dr. Florence Canner and Dr. Burt Knack. Past Medical History  Diagnosis Date  . CAD (coronary artery disease)     a. s/p CABG x 5;  b. 04/2012 Cath: patent LIMA->LAD and VG->OM3, all other grafts occluded.  . Hypertension   . Anxiety   . History of kidney cancer   . Adjustment disorder with anxiety   . Hyperlipidemia   . BPH (benign prostatic hypertrophy)   . COPD (chronic obstructive pulmonary disease)     a. on home O2.  . Arthritis   . Ischemic cardiomyopathy     a. 06/2013 Echo: EF 30-35%, no reg wma's, Gr2 DD, triv AI, mod dil LA, nl RV fxn.  . Chronic systolic CHF (congestive heart failure)     a. 06/2013 Echo: EF 30-35%.  .  ADENOCARCINOMA, PROSTATE dx'd 2012  . Renal cancer dx'd 1997    lt nephrectomy   Past Surgical History  Procedure Laterality Date  . Coronary artery bypass graft      x 5  . Cardiac catheterization    . Nephrectomy      left nephrectomy for ca  . Posterior cervical laminectomy      x 8   limited ROM  and can't lie flat  . Heart stents      x 5  . Robot assisted laparoscopic radical prostatectomy      for prostate cancer  . Cystoscopy with litholapaxy  05/01/2012    Procedure: CYSTOSCOPY WITH LITHOLAPAXY;  Surgeon: Dutch Gray, MD;  Location: [facility];  Service: Urology;  Laterality: N/A;  . Prostate cancer    . Kidney surgery Left 1994   Family History  Problem Relation Age of Onset  . Diabetes Mother   . Heart disease Mother   . Hypertension Mother   . Heart attack Mother   . Coronary artery disease    . Heart disease Father   . Diabetes Father   . Heart attack Father   . Heart disease Brother   . Heart attack Brother   . COPD Sister    History  Substance Use Topics  . Smoking status: Former Smoker -- 0.30 packs/day for 54 years    Types: Cigarettes  Quit date: 03/29/2013  . Smokeless tobacco: Former Systems developer    Quit date: 04/19/2012     Comment: Using e-cig now  . Alcohol Use: No    Review of Systems   See History of Present Illness; otherwise all other systems are reviewed and negative  Allergies  Ativan; Lisinopril; Morphine and related; and Ibuprofen  Home Medications   Prior to Admission medications   Medication Sig Start Date End Date Taking? Authorizing Lyrick Lagrand  albuterol (PROVENTIL HFA;VENTOLIN HFA) 108 (90 BASE) MCG/ACT inhaler Inhale 2 puffs into the lungs every 6 (six) hours as needed for wheezing or shortness of breath. Shortness of breath 11/02/13  Yes Domenic Polite, MD  albuterol (PROVENTIL) (2.5 MG/3ML) 0.083% nebulizer solution Take 2.5 mg by nebulization 4 (four) times daily. 04/06/13  Yes Erick Colace, NP  ALPRAZolam Duanne Moron) 1 MG tablet  Take 1 tablet (1 mg total) by mouth 3 (three) times daily as needed for anxiety. 11/09/13  Yes Theodis Blaze, MD  apixaban (ELIQUIS) 5 MG TABS tablet Take 1 tablet (5 mg total) by mouth 2 (two) times daily. 11/09/13  Yes Theodis Blaze, MD  aspirin EC 81 MG tablet Take 81 mg by mouth daily.   Yes Historical Kaine Mcquillen, MD  doxazosin (CARDURA) 8 MG tablet Take 8 mg by mouth at bedtime.    Yes Historical Oaklan Persons, MD  furosemide (LASIX) 40 MG tablet Take 20 mg by mouth 2 (two) times daily.  11/18/13  Yes Erline Hau, MD  HYDROcodone-acetaminophen [facility]) 10-325 MG per tablet Take 1 tablet by mouth every 4 (four) hours as needed for moderate pain.   Yes Historical Yavier Snider, MD  HYDROmorphone (DILAUDID) 4 MG tablet Take 4 mg by mouth 3 (three) times daily.  11/09/13  Yes Theodis Blaze, MD  ipratropium (ATROVENT) 0.02 % nebulizer solution Take 0.5 mg by nebulization 4 (four) times daily.   Yes Historical Gage Treiber, MD  Ipratropium-Albuterol (COMBIVENT RESPIMAT) 20-100 MCG/ACT AERS respimat Inhale 1 puff into the lungs every 6 (six) hours as needed for wheezing or shortness of breath. 11/02/13  Yes Domenic Polite, MD  mometasone-formoterol (DULERA) 100-5 MCG/ACT AERO Inhale 2 puffs into the lungs 2 (two) times daily. 11/02/13  Yes Domenic Polite, MD  NITROSTAT 0.3 MG SL tablet Place 0.3 mg under the tongue every 5 (five) minutes as needed for chest pain.  06/15/13  Yes Historical Walker Paddack, MD  predniSONE (DELTASONE) 10 MG tablet Take 1 tablet by mouth as needed (for fluid retention).    Yes Historical Lorice Lafave, MD  valsartan (DIOVAN) 40 MG tablet Take 40 mg by mouth daily.  11/09/13  Yes Historical Kerriann Kamphuis, MD  NON FORMULARY Place 2 L into the nose at bedtime. O2 at night only    Historical Claudetta Sallie, MD   BP 141/79  Pulse 104  Temp(Src) 98.6 F (37 C) (Oral)  Resp 24  SpO2 100% Physical Exam  Nursing note and vitals reviewed. Constitutional: He is oriented to person, place, and time. He appears  well-developed and well-nourished. He appears distressed.  HENT:  Head: Normocephalic and atraumatic.  Nose: Nose normal.  Mouth/Throat: Oropharynx is clear and moist.  Eyes: Conjunctivae and EOM are normal. Pupils are equal, round, and reactive to light.  Neck: Normal range of motion. Neck supple. No JVD present. No tracheal deviation present. No thyromegaly present.  Cardiovascular: Regular rhythm, normal heart sounds and intact distal pulses.  Exam reveals no gallop and no friction rub.   No murmur heard. Tachycardia noted  Pulmonary/Chest: Effort normal. No stridor. No respiratory distress. He has wheezes. He has no rales. He exhibits no tenderness.  Abdominal: Soft. Bowel sounds are normal. He exhibits no distension and no mass. There is no tenderness. There is no rebound and no guarding.  Musculoskeletal: Normal range of motion. He exhibits no edema and no tenderness.  Lymphadenopathy:    He has no cervical adenopathy.  Neurological: He is alert and oriented to person, place, and time. He has normal reflexes. No cranial nerve deficit. He exhibits normal muscle tone. Coordination normal.  Skin: Skin is warm and dry. No rash noted. No erythema. No pallor.  Psychiatric: He has a normal mood and affect. His behavior is normal. Judgment and thought content normal.    ED Course  Procedures (including critical care time) Labs Review Labs Reviewed  CBC WITH DIFFERENTIAL - Abnormal; Notable for the following:    Hemoglobin 12.9 (*)    RDW 16.5 (*)    Neutrophils Relative % 82 (*)    Lymphocytes Relative 11 (*)    All other components within normal limits  BASIC METABOLIC PANEL - Abnormal; Notable for the following:    Glucose, Bld 118 (*)    BUN 27 (*)    GFR calc non Af Amer 57 (*)    GFR calc Af Amer 67 (*)    All other components within normal limits  PRO B NATRIURETIC PEPTIDE - Abnormal; Notable for the following:    Pro B Natriuretic peptide (BNP) 1118.0 (*)    All other  components within normal limits  BASIC METABOLIC PANEL - Abnormal; Notable for the following:    Glucose, Bld 120 (*)    BUN 29 (*)    GFR calc non Af Amer 56 (*)    GFR calc Af Amer 65 (*)    All other components within normal limits  TROPONIN I    Imaging Review Ct Angio Chest Pe W/cm &/or Wo Cm  01/07/2014   CLINICAL DATA:  Status post angioplasty 12/26/2013. Chest pain and shortness of breath.  EXAM: CT ANGIOGRAPHY CHEST WITH CONTRAST  TECHNIQUE: Multidetector CT imaging of the chest was performed using the standard protocol during bolus administration of intravenous contrast. Multiplanar CT image reconstructions and MIPs were obtained to evaluate the vascular anatomy.  CONTRAST:  158mL OMNIPAQUE IOHEXOL 350 MG/ML SOLN  COMPARISON:  None.  FINDINGS: THORACIC INLET/BODY WALL:  Symmetric gynecomastia.  MEDIASTINUM:  Mild cardiomegaly. Diffuse coronary artery atherosclerosis, status post CABG. Cardiac motion limits assessment of graft patency, especially of the venous grafts. There is no pericardial effusion. No aortic aneurysm or dissection. There is notable noncalcified plaque or thrombus along the posterior mid descending thoracic aorta. Motion and bolus dispersion decreases sensitivity in evaluating the pulmonary arteries beyond the proximal segmental level. There is no evidence of pulmonary embolism.  LUNG WINDOWS:  Moderate centrilobular emphysema. There are small scattered smooth pulmonary nodules, most peripheral:  Right lower lobe, image 43, 6 mm.  Right lower lobe, image 40, 4 mm.  Right middle lobe, image 46, 2 nodules measuring 5 and 6 mm.  No consolidation, effusion, or pneumothorax. There is diffuse bronchial wall thickening and mucoid impaction, likely also smoking-related.  UPPER ABDOMEN:  Partially imaged changes of left nephrectomy.  OSSEOUS:  A lower cervical corpectomy and strut graft is noted.  Review of the MIP images confirms the above findings.  IMPRESSION: 1. Negative for  pulmonary embolism to the proximal segmental level. Negative for aortic dissection. 2. COPD. 3.  Small pulmonary nodules, measuring up to 6 mm in the right middle and lower lobes. See recommendations below.  RECOMMENDATIONS: Given emphysema, follow-up chest CT at 6 - 12 months is recommended. This recommendation follows the consensus statement: Guidelines for Management of Small Pulmonary Nodules Detected on CT Scans: A Statement from the [employer] as published in Radiology 2005;237:395-400.   Electronically Signed   By: Jorje Guild M.D.   On: 01/07/2014 02:30   Dg Chest Port 1 View  01/07/2014   CLINICAL DATA:  Chest pain, prior myocardial infarction.  EXAM: PORTABLE CHEST - 1 VIEW  COMPARISON:  Chest radiograph Nov 14, 2013  FINDINGS: Cardiac silhouette remains mildly enlarged, status post median sternotomy for CABG. Mild chronic interstitial changes, similar without pleural effusions or focal consolidations. No pneumothorax.  Multiple EKG lines overlie the patient and may obscure subtle underlying pathology. Soft tissue planes and included osseous structures are nonsuspicious.  IMPRESSION: Stable cardiomegaly and chronic interstitial changes without superimposed acute pulmonary process.   Electronically Signed   By: Elon Alas   On: 01/07/2014 00:55     EKG Interpretation   Date/Time:  Sunday January 06 2014 23:49:07 EDT Ventricular Rate:  110 PR Interval:  138 QRS Duration: 97 QT Interval:  350 QTC Calculation: 473 R Axis:   90 Text Interpretation:  Sinus tachycardia Borderline right axis deviation  Anteroseptal infarct, old Borderline repolarization abnormality Baseline  wander in lead(s) I II aVR No significant change since last tracing  Confirmed by OTTER  MD, OLGA ([id number]) on 01/07/2014 12:59:20 AM      MDM   Final diagnoses:  Chest pain, unspecified chest pain type  COPD, moderate  Chronic systolic congestive heart failure    68 year old male with chest pain and  shortness of breath.  Patient has history of coronary disease, CHF, COPD.  Patient recently had angioplasty done.  He is on apixaban, but I am concerned about possible embolic thrombotic causing PE.  EKG shows sinus tach without ST elevation.  Initial troponin is negative.  We'll give pain medication and nitroglycerin via IV, plan for CT angioma chest.  Patient will need admission given his high risk history    Kalman Drape, MD 01/07/14 [phone]

## 2014-01-07 NOTE — ED Notes (Signed)
Chest xray has been done

## 2014-01-07 NOTE — ED Notes (Signed)
Called Amy (respiratory) to come do treatment for patient

## 2014-01-07 NOTE — Progress Notes (Signed)
ANTICOAGULATION CONSULT NOTE - Initial Consult  Pharmacy Consult for Heparin Indication: chest pain/ACS  Allergies  Allergen Reactions  . Ativan [Lorazepam] Other (See Comments)    Increases agitation, tolerates xanax  . Lisinopril Cough  . Morphine And Related     Hallucinations, too sedated when given with ativan  . Ibuprofen Hives, Nausea And Vomiting and Rash    Tolerates baby aspirin per patient.      Patient Measurements: Height: 5\' 9"  (175.3 cm) Weight: 214 lb 12.8 oz (97.433 kg) IBW/kg (Calculated) : 70.7 Heparin Dosing Weight:   Vital Signs: Temp: 97.4 F (36.3 C) (06/29 0443) Temp src: Oral (06/29 0443) BP: 135/94 mmHg (06/29 0443) Pulse Rate: 83 (06/29 0443)  Labs:  Recent Labs  01/07/14 0005 01/07/14 0219  HGB 12.9*  --   HCT 39.0  --   PLT 150  --   CREATININE 1.25 1.27  TROPONINI <0.30  --     Estimated Creatinine Clearance: 64.1 ml/min (by C-G formula based on Cr of 1.27).   Medical History: Past Medical History  Diagnosis Date  . CAD (coronary artery disease)     a. s/p CABG x 5;  b. 04/2012 Cath: patent LIMA->LAD and VG->OM3, all other grafts occluded.  . Hypertension   . Anxiety   . History of kidney cancer   . Adjustment disorder with anxiety   . Hyperlipidemia   . BPH (benign prostatic hypertrophy)   . COPD (chronic obstructive pulmonary disease)     a. on home O2.  . Arthritis   . Ischemic cardiomyopathy     a. 06/2013 Echo: EF 30-35%, no reg wma's, Gr2 DD, triv AI, mod dil LA, nl RV fxn.  . Chronic systolic CHF (congestive heart failure)     a. 06/2013 Echo: EF 30-35%.  . ADENOCARCINOMA, PROSTATE dx'd 2012  . Renal cancer dx'd 1997    lt nephrectomy    Medications:  Infusions:  . heparin    . nitroGLYCERIN 5 mcg/min (01/07/14 0220)    Assessment: Patient with ACS/stemi and possible PE.  Patient on apixaban PTA but last dose > 12hr ago.  Due to prior apixaban usage will monitor with PTT along with heparin level until  values correlate and then use heparin level only.  Goal of Therapy:  Heparin level 0.3-0.7 units/ml Monitor platelets by anticoagulation protocol: Yes   Plan:  Heparin bolus 4000 units iv x1 Heparin drip at 1150 units/hr Daily heparin level and CBC Next heparin level at  1400, along with PTT    Tyler Deis, Shea Stakes [place] 01/07/2014,5:15 AM

## 2014-01-07 NOTE — Progress Notes (Signed)
[facility] PROGRESS NOTE  Lucas [name] SWN:[id number] DOB: Oct 07, 1945 DOA: 01/06/2014 PCP: Salena Saner., MD  Assessment/Plan: 1. Chest pain with history of CAD status post CABG and stenting - concerning for unstable angina. Patient n.p.o. Holding apixaban started heparin infusion. Aspirin. Nitroglycerin patch. Given cardiac hx, will consult Cardiology 2. Atrial fibrillation - holding apixaban and started heparin infusion. Rate controlled 3. Chronic systolic heart failure last EF was 30-35% - Lasix 20 mg IV every 12. Closely follow intake output and metabolic panel. 4. COPD exacerbation - patient is on nebulizer and Pulmicort. On 3L [place] 5. Peripheral arterial disease status post recent angioplasty - LE tenderness. Extremities are warm to touch. 6. Anemia - follow CBC. Stable currently 7. Pulmonary nodules - will need further followup as outpatient through primary care. 8. Hyperlipidemia - intolerant to statins.  Code Status: Full Family Communication: Pt in room (indicate person spoken with, relationship, and if by phone, the number) Disposition Plan: Pending   Consultants:  Cardiology  Procedures:    Antibiotics:   HPI/Subjective: Continues with intermittent chest pains  Objective: Filed Vitals:   01/07/14 0254 01/07/14 0256 01/07/14 0443 01/07/14 0728  BP:  155/88 135/94   Pulse: 86 91 83   Temp:   97.4 F (36.3 C)   TempSrc:   Oral   Resp: 23 20 20    Height:   5\' 9"  (1.753 m)   Weight:   214 lb 12.8 oz (97.433 kg)   SpO2: 92% 92% 100% 94%    Intake/Output Summary (Last 24 hours) at 01/07/14 0749 Last data filed at 01/07/14 0654  Gross per 24 hour  Intake 118.35 ml  Output    875 ml  Net -756.65 ml   Filed Weights   01/07/14 0443  Weight: 214 lb 12.8 oz (97.433 kg)    Exam:   General:  Awake, in nad  Cardiovascular: regular, s1, s2  Respiratory: normal resp effort, end-expiratory wheezing  Abdomen: soft, obese,  nondistended  Musculoskeletal: perfused, no clubbing   Data Reviewed: Basic Metabolic Panel:  Recent Labs Lab 01/07/14 0005 01/07/14 0219 01/07/14 0635  NA 140 139 141  K 4.4 4.6 4.6  CL 102 102 101  CO2 21 22 23   GLUCOSE 118* 120* 115*  BUN 27* 29* 30*  CREATININE 1.25 1.27 1.34  CALCIUM 9.1 8.9 8.7   Liver Function Tests:  Recent Labs Lab 01/07/14 0635  AST 23  ALT 14  ALKPHOS 52  BILITOT 0.3  PROT 7.5  ALBUMIN 3.9   No results found for this basename: LIPASE, AMYLASE,  in the last 168 hours No results found for this basename: AMMONIA,  in the last 168 hours CBC:  Recent Labs Lab 01/07/14 0005 01/07/14 0635  WBC 8.3 9.0  NEUTROABS 6.8 7.3  HGB 12.9* 12.4*  HCT 39.0 37.6*  MCV 90.9 91.7  PLT 150 159   Cardiac Enzymes:  Recent Labs Lab 01/07/14 0005 01/07/14 0635  TROPONINI <0.30 <0.30   BNP (last 3 results)  Recent Labs  11/14/13 1105 11/21/13 1100 01/07/14 0005  PROBNP 1601.0* 226.0* 1118.0*   CBG: No results found for this basename: GLUCAP,  in the last 168 hours  No results found for this or any previous visit (from the past 240 hour(s)).   Studies: Ct Angio Chest Pe W/cm &/or Wo Cm  01/07/2014   CLINICAL DATA:  Status post angioplasty 12/26/2013. Chest pain and shortness of breath.  EXAM: CT ANGIOGRAPHY CHEST WITH CONTRAST  TECHNIQUE: Multidetector CT imaging  of the chest was performed using the standard protocol during bolus administration of intravenous contrast. Multiplanar CT image reconstructions and MIPs were obtained to evaluate the vascular anatomy.  CONTRAST:  162mL OMNIPAQUE IOHEXOL 350 MG/ML SOLN  COMPARISON:  None.  FINDINGS: THORACIC INLET/BODY WALL:  Symmetric gynecomastia.  MEDIASTINUM:  Mild cardiomegaly. Diffuse coronary artery atherosclerosis, status post CABG. Cardiac motion limits assessment of graft patency, especially of the venous grafts. There is no pericardial effusion. No aortic aneurysm or dissection. There is  notable noncalcified plaque or thrombus along the posterior mid descending thoracic aorta. Motion and bolus dispersion decreases sensitivity in evaluating the pulmonary arteries beyond the proximal segmental level. There is no evidence of pulmonary embolism.  LUNG WINDOWS:  Moderate centrilobular emphysema. There are small scattered smooth pulmonary nodules, most peripheral:  Right lower lobe, image 43, 6 mm.  Right lower lobe, image 40, 4 mm.  Right middle lobe, image 46, 2 nodules measuring 5 and 6 mm.  No consolidation, effusion, or pneumothorax. There is diffuse bronchial wall thickening and mucoid impaction, likely also smoking-related.  UPPER ABDOMEN:  Partially imaged changes of left nephrectomy.  OSSEOUS:  A lower cervical corpectomy and strut graft is noted.  Review of the MIP images confirms the above findings.  IMPRESSION: 1. Negative for pulmonary embolism to the proximal segmental level. Negative for aortic dissection. 2. COPD. 3. Small pulmonary nodules, measuring up to 6 mm in the right middle and lower lobes. See recommendations below.  RECOMMENDATIONS: Given emphysema, follow-up chest CT at 6 - 12 months is recommended. This recommendation follows the consensus statement: Guidelines for Management of Small Pulmonary Nodules Detected on CT Scans: A Statement from the [employer] as published in Radiology 2005;237:395-400.   Electronically Signed   By: Jorje Guild M.D.   On: 01/07/2014 02:30   Dg Chest Port 1 View  01/07/2014   CLINICAL DATA:  Chest pain, prior myocardial infarction.  EXAM: PORTABLE CHEST - 1 VIEW  COMPARISON:  Chest radiograph Nov 14, 2013  FINDINGS: Cardiac silhouette remains mildly enlarged, status post median sternotomy for CABG. Mild chronic interstitial changes, similar without pleural effusions or focal consolidations. No pneumothorax.  Multiple EKG lines overlie the patient and may obscure subtle underlying pathology. Soft tissue planes and included osseous  structures are nonsuspicious.  IMPRESSION: Stable cardiomegaly and chronic interstitial changes without superimposed acute pulmonary process.   Electronically Signed   By: Elon Alas   On: 01/07/2014 00:55    Scheduled Meds: . aspirin EC  325 mg Oral Daily  . budesonide (PULMICORT) nebulizer solution  0.25 mg Nebulization BID  . doxazosin  8 mg Oral QHS  . furosemide  20 mg Intravenous BID  . ipratropium-albuterol  3 mL Nebulization Q6H  . irbesartan  37.5 mg Oral Daily  . sodium chloride  3 mL Intravenous Q12H  . sodium chloride  3 mL Intravenous Q12H  . sodium chloride  3 mL Intravenous Q12H   Continuous Infusions: . heparin 1,150 Units/hr (01/07/14 0545)  . nitroGLYCERIN 5 mcg/min (01/07/14 0220)    Active Problems:   Ischemic cardiomyopathy   COPD exacerbation   Chest pain   Chronic systolic congestive heart failure   Time spent: 73min   [facility] Pager [phone]. If 7PM-7AM, please contact night-coverage at [email or web address], password [facility] 01/07/2014, 7:49 AM  LOS: 1 day

## 2014-01-07 NOTE — Plan of Care (Signed)
Problem: Consults Goal: Heart Failure Patient Education (See Patient Education module for education specifics.) Outcome: Progressing Heart failure packet given Goal: Tobacco Cessation referral if indicated Outcome: Not Applicable Date Met:  62/37/62 Quit sept 2014  Problem: Phase I Progression Outcomes Goal: EF % per last Echo/documented,Core Reminder form on chart Outcome: Progressing Dec 2014 EF30-35%

## 2014-01-07 NOTE — Progress Notes (Signed)
ANTICOAGULATION CONSULT NOTE -Follow Up Consult  Pharmacy Consult for Heparin Indication: chest pain/ACS  Allergies  Allergen Reactions  . Ativan [Lorazepam] Other (See Comments)    Increases agitation, tolerates xanax  . Lisinopril Cough  . Morphine And Related     Hallucinations, too sedated when given with ativan  . Ibuprofen Hives, Nausea And Vomiting and Rash    Tolerates baby aspirin per patient.      Patient Measurements: Height: 5\' 9"  (175.3 cm) Weight: 214 lb 12.8 oz (97.433 kg) IBW/kg (Calculated) : 70.7 Heparin Dosing Weight:   Vital Signs: Temp: 98 F (36.7 C) (06/29 1444) Temp src: Oral (06/29 1444) BP: 110/63 mmHg (06/29 1444) Pulse Rate: 63 (06/29 1444)  Labs:  Recent Labs  01/07/14 0005 01/07/14 0219 01/07/14 0635 01/07/14 1345  HGB 12.9*  --  12.4*  --   HCT 39.0  --  37.6*  --   PLT 150  --  159  --   APTT  --   --   --  56*  HEPARINUNFRC  --   --   --  0.46  CREATININE 1.25 1.27 1.34  --   TROPONINI <0.30  --  <0.30 <0.30    Estimated Creatinine Clearance: 60.7 ml/min (by C-G formula based on Cr of 1.34).   Medical History: Past Medical History  Diagnosis Date  . CAD (coronary artery disease)     a. s/p CABG x 5;  b. 04/2012 Cath: patent LIMA->LAD and VG->OM3, all other grafts occluded.  . Hypertension   . Anxiety   . History of kidney cancer   . Adjustment disorder with anxiety   . Hyperlipidemia   . BPH (benign prostatic hypertrophy)   . COPD (chronic obstructive pulmonary disease)     a. on home O2.  . Arthritis   . Ischemic cardiomyopathy     a. 06/2013 Echo: EF 30-35%, no reg wma's, Gr2 DD, triv AI, mod dil LA, nl RV fxn.  . Chronic systolic CHF (congestive heart failure)     a. 06/2013 Echo: EF 30-35%.  . ADENOCARCINOMA, PROSTATE dx'd 2012  . Renal cancer dx'd 1997    lt nephrectomy    Medications:  Infusions:  . heparin 1,150 Units/hr (01/07/14 0545)    Assessment: Patient with ACS/stemi.  Patient on apixaban PTA.   Due to prior apixaban usage will monitor with PTT along with heparin level until values correlate and then use heparin level only.  CTAngio shows no PE  First Heparin level = 0.46 and PTT = 56 sec with heparin infusing @ 1150 units/hr  No complications of therapy noted  Goal of Therapy:  Heparin level 0.3-0.7 units/ml Monitor platelets by anticoagulation protocol: Yes   Plan:  Continue Heparin drip at 1150 units/hr Check Heparin level and PTT again to confirm therapeutic dose Daily heparin level and CBC Next heparin level at 2200, along with PTT    Poindexter, Toribio Harbour, PharmD 01/07/2014,3:49 PM

## 2014-01-07 NOTE — Progress Notes (Signed)
Assumed care of patient from previous RN.  Agree with previous RN's assessment of patient.  Will continue to monitor patient.

## 2014-01-07 NOTE — Consult Note (Signed)
CARDIOLOGY CONSULT NOTE   Patient ID: Lucas [name] MRN: [id number], DOB/AGE: 68   Admit date: 01/06/2014 Date of Consult: 01/07/2014   Primary Physician: Salena Saner., MD Primary Cardiologist: Dr. Jenkins Rouge  Pt. Profile  This 68 year old gentleman with known coronary disease and prior CABG was admitted with left-sided chest discomfort and with pain in the right lower extremity.  Problem List  Past Medical History  Diagnosis Date  . CAD (coronary artery disease)     a. s/p CABG x 5;  b. 04/2012 Cath: patent LIMA->LAD and VG->OM3, all other grafts occluded.  . Hypertension   . Anxiety   . History of kidney cancer   . Adjustment disorder with anxiety   . Hyperlipidemia   . BPH (benign prostatic hypertrophy)   . COPD (chronic obstructive pulmonary disease)     a. on home O2.  . Arthritis   . Ischemic cardiomyopathy     a. 06/2013 Echo: EF 30-35%, no reg wma's, Gr2 DD, triv AI, mod dil LA, nl RV fxn.  . Chronic systolic CHF (congestive heart failure)     a. 06/2013 Echo: EF 30-35%.  . ADENOCARCINOMA, PROSTATE dx'd 2012  . Renal cancer dx'd 1997    lt nephrectomy    Past Surgical History  Procedure Laterality Date  . Coronary artery bypass graft      x 5  . Cardiac catheterization    . Nephrectomy      left nephrectomy for ca  . Posterior cervical laminectomy      x 8   limited ROM  and can't lie flat  . Heart stents      x 5  . Robot assisted laparoscopic radical prostatectomy      for prostate cancer  . Cystoscopy with litholapaxy  05/01/2012    Procedure: CYSTOSCOPY WITH LITHOLAPAXY;  Surgeon: Dutch Gray, MD;  Location: [facility];  Service: Urology;  Laterality: N/A;  . Prostate cancer    . Kidney surgery Left 1994     Allergies  Allergies  Allergen Reactions  . Ativan [Lorazepam] Other (See Comments)    Increases agitation, tolerates xanax  . Lisinopril Cough  . Morphine And Related     Hallucinations, too sedated when given with  ativan  . Ibuprofen Hives, Nausea And Vomiting and Rash    Tolerates baby aspirin per patient.      HPI   This 68 year old gentleman has known ischemic heart disease.  He has a history of ischemic cardiomyopathy.  He has a history of CABG.  He has a past history of congestive heart failure.  His last cardiac catheterization was in August 21 2013.  He had the following: 1. Severe three-vessel coronary artery disease  2. 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  3. Normal intracardiac hemodynamics  Recommendations: Continued medical therapy. His coronary anatomy is stable from his prior cardiac catheterization. The patient also has severe peripheral vascular disease.  Vascular access has been very difficult.  On 12/25/13 the patient underwent lower extremity catheterization by Dr. Trula Slade for right leg claudication: The findings were as follows: #1 high-grade, greater than 80% proximal right external iliac artery stenosis, successfully treated using a 6 x 40 chocolate balloon  #2 right superficial femoral artery occlusion with reconstitution of the above-knee popliteal artery at the level of the patella  #3 two-vessel runoff via the peroneal  and posterior tibial artery The patient was admitted last evening because of left-sided chest discomfort.  The pain was worse with taking a deep breath.  A CT angiogram of the chest in the emergency room showed no pulmonary emboli.  The patient has been on a apixaban at home.  He is now able to obtain the drug at a cost which is affordable to him and he has been compliant in taking it.  Today his chest pain is improved.  His main concern is his right leg discomfort.  His initial troponins are normal.  His EKG shows no acute changes from prior tracings.  He appears to be euvolemic today and does not have any peripheral edema.   Inpatient  Medications  . aspirin EC  325 mg Oral Daily  . budesonide (PULMICORT) nebulizer solution  0.25 mg Nebulization BID  . doxazosin  8 mg Oral QHS  . furosemide  20 mg Intravenous BID  . ipratropium-albuterol  3 mL Nebulization Q6H  . irbesartan  37.5 mg Oral Daily  . methylPREDNISolone (SOLU-MEDROL) injection  60 mg Intravenous Q6H  . sodium chloride  3 mL Intravenous Q12H  . sodium chloride  3 mL Intravenous Q12H  . sodium chloride  3 mL Intravenous Q12H    Family History Family History  Problem Relation Age of Onset  . Diabetes Mother   . Heart disease Mother   . Hypertension Mother   . Heart attack Mother   . Coronary artery disease    . Heart disease Father   . Diabetes Father   . Heart attack Father   . Heart disease Brother   . Heart attack Brother   . COPD Sister      Social History History   Social History  . Marital Status: Widowed    Spouse Name: N/A    Number of Children: N/A  . Years of Education: N/A   Occupational History  . disabled    Social History Main Topics  . Smoking status: Former Smoker -- 0.30 packs/day for 54 years    Types: Cigarettes    Quit date: 03/29/2013  . Smokeless tobacco: Former Systems developer    Quit date: 04/19/2012     Comment: Using e-cig now  . Alcohol Use: No  . Drug Use: No  . Sexual Activity: Not Currently   Other Topics Concern  . Not on file   Social History Narrative    He lives in [place] by himself.  He is a widower.    He continues to smoke about 4 or 5  cigarettes a day but has a greater than 100 pack-year history having  previously smoked up to 3-4 packs a day over the span about 48 years.     He denies alcohol or drug use.  He is retired secondary to disability     since the 66s.      Review of Systems  General:  No chills, fever, night sweats or weight changes.  Cardiovascular:  No  edema, orthopnea, palpitations, paroxysmal nocturnal dyspnea. Dermatological: No rash, lesions/masses Respiratory: No cough,  dyspnea Urologic: No hematuria, dysuria Abdominal:   No nausea, vomiting, diarrhea, bright red blood per rectum, melena, or hematemesis Neurologic:  No visual changes, wkns, changes in mental status. All other systems reviewed and are otherwise negative except as noted above.  Physical Exam  Blood pressure 135/94, pulse 83, temperature 97.4 F (36.3 C), temperature source Oral, resp. rate 20, height 5\' 9"  (1.753 m), weight 214 lb 12.8  oz (97.433 kg), SpO2 94.00%.  General: Pleasant, NAD Psych: Normal affect. Neuro: Alert and oriented X 3. Moves all extremities spontaneously. HEENT: Normal  Neck: Supple without bruits or JVD. Lungs:  No significant rales or rhonchi. Heart: RRR no s3, s4, or murmurs. Abdomen: Soft, non-tender, non-distended, BS + x 4.  Extremities: No clubbing, cyanosis or edema.  Pedal pulses are equivocal.  Labs   Recent Labs  01/07/14 0005 01/07/14 0635  TROPONINI <0.30 <0.30   Lab Results  Component Value Date   WBC 9.0 01/07/2014   HGB 12.4* 01/07/2014   HCT 37.6* 01/07/2014   MCV 91.7 01/07/2014   PLT 159 01/07/2014     Recent Labs Lab 01/07/14 0635  NA 141  K 4.6  CL 101  CO2 23  BUN 30*  CREATININE 1.34  CALCIUM 8.7  PROT 7.5  BILITOT 0.3  ALKPHOS 52  ALT 14  AST 23  GLUCOSE 115*   Lab Results  Component Value Date   CHOL 208* 07/01/2013   HDL 34* 07/01/2013   LDLCALC 145* 07/01/2013   TRIG 144 07/01/2013   Lab Results  Component Value Date   DDIMER 0.72* 07/01/2013    Radiology/Studies  Ct Angio Chest Pe W/cm &/or Wo Cm  01/07/2014   CLINICAL DATA:  Status post angioplasty 12/26/2013. Chest pain and shortness of breath.  EXAM: CT ANGIOGRAPHY CHEST WITH CONTRAST  TECHNIQUE: Multidetector CT imaging of the chest was performed using the standard protocol during bolus administration of intravenous contrast. Multiplanar CT image reconstructions and MIPs were obtained to evaluate the vascular anatomy.  CONTRAST:  138mL OMNIPAQUE  IOHEXOL 350 MG/ML SOLN  COMPARISON:  None.  FINDINGS: THORACIC INLET/BODY WALL:  Symmetric gynecomastia.  MEDIASTINUM:  Mild cardiomegaly. Diffuse coronary artery atherosclerosis, status post CABG. Cardiac motion limits assessment of graft patency, especially of the venous grafts. There is no pericardial effusion. No aortic aneurysm or dissection. There is notable noncalcified plaque or thrombus along the posterior mid descending thoracic aorta. Motion and bolus dispersion decreases sensitivity in evaluating the pulmonary arteries beyond the proximal segmental level. There is no evidence of pulmonary embolism.  LUNG WINDOWS:  Moderate centrilobular emphysema. There are small scattered smooth pulmonary nodules, most peripheral:  Right lower lobe, image 43, 6 mm.  Right lower lobe, image 40, 4 mm.  Right middle lobe, image 46, 2 nodules measuring 5 and 6 mm.  No consolidation, effusion, or pneumothorax. There is diffuse bronchial wall thickening and mucoid impaction, likely also smoking-related.  UPPER ABDOMEN:  Partially imaged changes of left nephrectomy.  OSSEOUS:  A lower cervical corpectomy and strut graft is noted.  Review of the MIP images confirms the above findings.  IMPRESSION: 1. Negative for pulmonary embolism to the proximal segmental level. Negative for aortic dissection. 2. COPD. 3. Small pulmonary nodules, measuring up to 6 mm in the right middle and lower lobes. See recommendations below.  RECOMMENDATIONS: Given emphysema, follow-up chest CT at 6 - 12 months is recommended. This recommendation follows the consensus statement: Guidelines for Management of Small Pulmonary Nodules Detected on CT Scans: A Statement from the [place] as published in Radiology 2005;237:395-400.   Electronically Signed   By: Jorje Guild M.D.   On: 01/07/2014 02:30   Dg Chest Port 1 View  01/07/2014   CLINICAL DATA:  Chest pain, prior myocardial infarction.  EXAM: PORTABLE CHEST - 1 VIEW  COMPARISON:  Chest  radiograph Nov 14, 2013  FINDINGS: Cardiac silhouette remains mildly enlarged, status post median  sternotomy for CABG. Mild chronic interstitial changes, similar without pleural effusions or focal consolidations. No pneumothorax.  Multiple EKG lines overlie the patient and may obscure subtle underlying pathology. Soft tissue planes and included osseous structures are nonsuspicious.  IMPRESSION: Stable cardiomegaly and chronic interstitial changes without superimposed acute pulmonary process.   Electronically Signed   By: Elon Alas   On: 01/07/2014 00:55    ECG  Normal sinus rhythm.  Old anteroseptal myocardial infarction.  No acute changes.  ASSESSMENT AND PLAN 1. ischemic heart disease status post CABG x5.  Most recent cardiac catheterization February 2015.  Vascular status felt to be stable and continued medical therapy advised.  He has difficult vascular access. 2. severe peripheral arterial occlusive disease status post recent right external iliac artery stenosis successfully treated with angioplasty Dr. Trula Slade. 3. COPD 4. ischemic cardiomyopathy with last echo 11/08/13 showing EF of 40-45% with wall motion abnormalities.  Recommendation: The patient will not need another cardiac catheterization on this admission unless we see significant enzyme elevation or new EKG changes.  Would continue medical therapy for his ischemic heart disease and his peripheral arterial occlusive disease.  His LDL cholesterol is 145.  Lipids are not listed on his allergies.  It would be worthwhile to explore why he is not on a statin and consider possible low dose statin at least.  He is currently on low-dose IV nitroglycerin.  We'll switch to isosorbide mononitrate.  If his right leg continues to bother him severely it may be worthwhile to ask vascular surgeons to check his leg.  Signed, Darlin Coco, MD  01/07/2014, 11:36 AM

## 2014-01-07 NOTE — H&P (Signed)
[facility] History and Physical  Lucas [name] GEZ:[id number] DOB: 1946-03-25 DOA: 01/06/2014  Referring physician: ER physician. PCP: Salena Saner., MD  Chief Complaint: Chest pain.  HPI: Lucas [name] is a 68 y.o. male with history of CAD status post CABG status post stenting, ischemic cardiomyopathy, COPD, hyperlipidemia, peripheral arterial disease, prostate cancer and renal cell cancer presents to the ER because of chest pain. Patient has been having left-sided chest pain radiating to the back and neck since 6 PM last evening. Pain is pressure-like increases on deep inspiration and has associated shortness of breath and nausea denies any diaphoresis palpitations dizziness or vomiting. Patient has had recent right lower extremity angioplasty. On exam patient has mild lower extremity edema and in the ER patient had undergone CT angiogram of the chest which was negative for PE. Patient still has chest pain and was placed on fentanyl IV for which patient's chest pain improved. Cardiac markers have been negative her EKG shows atrial fibrillation. Patient denies any headache visual symptoms focal deficits abdominal pain.  Review of Systems: As presented in the history of presenting illness, rest negative.  Past Medical History  Diagnosis Date  . CAD (coronary artery disease)     a. s/p CABG x 5;  b. 04/2012 Cath: patent LIMA->LAD and VG->OM3, all other grafts occluded.  . Hypertension   . Anxiety   . History of kidney cancer   . Adjustment disorder with anxiety   . Hyperlipidemia   . BPH (benign prostatic hypertrophy)   . COPD (chronic obstructive pulmonary disease)     a. on home O2.  . Arthritis   . Ischemic cardiomyopathy     a. 06/2013 Echo: EF 30-35%, no reg wma's, Gr2 DD, triv AI, mod dil LA, nl RV fxn.  . Chronic systolic CHF (congestive heart failure)     a. 06/2013 Echo: EF 30-35%.  . ADENOCARCINOMA, PROSTATE dx'd 2012  . Renal cancer dx'd 1997    lt  nephrectomy   Past Surgical History  Procedure Laterality Date  . Coronary artery bypass graft      x 5  . Cardiac catheterization    . Nephrectomy      left nephrectomy for ca  . Posterior cervical laminectomy      x 8   limited ROM  and can't lie flat  . Heart stents      x 5  . Robot assisted laparoscopic radical prostatectomy      for prostate cancer  . Cystoscopy with litholapaxy  05/01/2012    Procedure: CYSTOSCOPY WITH LITHOLAPAXY;  Surgeon: Dutch Gray, MD;  Location: [facility];  Service: Urology;  Laterality: N/A;  . Prostate cancer    . Kidney surgery Left 1994   Social History:  reports that he quit smoking about 9 months ago. His smoking use included Cigarettes. He has a 16.2 pack-year smoking history. He quit smokeless tobacco use about 20 months ago. He reports that he does not drink alcohol or use illicit drugs. Where does patient live home. Can patient participate in ADLs? Yes.  Allergies  Allergen Reactions  . Ativan [Lorazepam] Other (See Comments)    Increases agitation, tolerates xanax  . Lisinopril Cough  . Morphine And Related     Hallucinations, too sedated when given with ativan  . Ibuprofen Hives, Nausea And Vomiting and Rash    Tolerates baby aspirin per patient.      Family History:  Family History  Problem Relation Age of Onset  .  Diabetes Mother   . Heart disease Mother   . Hypertension Mother   . Heart attack Mother   . Coronary artery disease    . Heart disease Father   . Diabetes Father   . Heart attack Father   . Heart disease Brother   . Heart attack Brother   . COPD Sister       Prior to Admission medications   Medication Sig Start Date End Date Taking? Authorizing Emina Ribaudo  albuterol (PROVENTIL HFA;VENTOLIN HFA) 108 (90 BASE) MCG/ACT inhaler Inhale 2 puffs into the lungs every 6 (six) hours as needed for wheezing or shortness of breath. Shortness of breath 11/02/13  Yes Domenic Polite, MD  albuterol (PROVENTIL) (2.5 MG/3ML) 0.083%  nebulizer solution Take 2.5 mg by nebulization 4 (four) times daily. 04/06/13  Yes Erick Colace, NP  ALPRAZolam Duanne Moron) 1 MG tablet Take 1 tablet (1 mg total) by mouth 3 (three) times daily as needed for anxiety. 11/09/13  Yes Theodis Blaze, MD  apixaban (ELIQUIS) 5 MG TABS tablet Take 1 tablet (5 mg total) by mouth 2 (two) times daily. 11/09/13  Yes Theodis Blaze, MD  aspirin EC 81 MG tablet Take 81 mg by mouth daily.   Yes Historical Jazmine Heckman, MD  doxazosin (CARDURA) 8 MG tablet Take 8 mg by mouth at bedtime.    Yes Historical Ayahna Solazzo, MD  furosemide (LASIX) 40 MG tablet Take 20 mg by mouth 2 (two) times daily.  11/18/13  Yes Erline Hau, MD  HYDROcodone-acetaminophen [facility]) 10-325 MG per tablet Take 1 tablet by mouth every 4 (four) hours as needed for moderate pain.   Yes Historical Omarius Grantham, MD  HYDROmorphone (DILAUDID) 4 MG tablet Take 4 mg by mouth 3 (three) times daily.  11/09/13  Yes Theodis Blaze, MD  ipratropium (ATROVENT) 0.02 % nebulizer solution Take 0.5 mg by nebulization 4 (four) times daily.   Yes Historical Cleopatra Sardo, MD  Ipratropium-Albuterol (COMBIVENT RESPIMAT) 20-100 MCG/ACT AERS respimat Inhale 1 puff into the lungs every 6 (six) hours as needed for wheezing or shortness of breath. 11/02/13  Yes Domenic Polite, MD  mometasone-formoterol (DULERA) 100-5 MCG/ACT AERO Inhale 2 puffs into the lungs 2 (two) times daily. 11/02/13  Yes Domenic Polite, MD  NITROSTAT 0.3 MG SL tablet Place 0.3 mg under the tongue every 5 (five) minutes as needed for chest pain.  06/15/13  Yes Historical Maleiya Pergola, MD  predniSONE (DELTASONE) 10 MG tablet Take 1 tablet by mouth as needed (for fluid retention).    Yes Historical Jennifier Smitherman, MD  valsartan (DIOVAN) 40 MG tablet Take 40 mg by mouth daily.  11/09/13  Yes Historical Indalecio Malmstrom, MD  NON FORMULARY Place 2 L into the nose at bedtime. O2 at night only    Historical Murl Zogg, MD    Physical Exam: Filed Vitals:   01/07/14 0245 01/07/14 0254 01/07/14  0256 01/07/14 0443  BP: 150/74  155/88 135/94  Pulse: 88 86 91 83  Temp:    97.4 F (36.3 C)  TempSrc:    Oral  Resp:  23 20 20   Height:    5\' 9"  (1.753 m)  Weight:    97.433 kg (214 lb 12.8 oz)  SpO2: 95% 92% 92% 100%     General:  Well-developed and nourished.  Eyes: Anicteric no pallor.  ENT: No discharge from ears eyes nose mouth.  Neck: No mass felt. JVD not appreciated.  Cardiovascular: S1-S2 heard.  Respiratory: Mild expiratory wheeze heard no crepitations.  Abdomen: Soft  mild distended no guarding or rigidity. Nonspecific tenderness in the epigastric area.  Skin: Chronic skin changes of the lower extremity.  Musculoskeletal: Mild edema of the both lower extremity. Extremities are warm.  Psychiatric: Appears normal.  Neurologic: Alert awake oriented to time place and person. Moves all extremities.  Labs on Admission:  Basic Metabolic Panel:  Recent Labs Lab 01/07/14 0005 01/07/14 0219  NA 140 139  K 4.4 4.6  CL 102 102  CO2 21 22  GLUCOSE 118* 120*  BUN 27* 29*  CREATININE 1.25 1.27  CALCIUM 9.1 8.9   Liver Function Tests: No results found for this basename: AST, ALT, ALKPHOS, BILITOT, PROT, ALBUMIN,  in the last 168 hours No results found for this basename: LIPASE, AMYLASE,  in the last 168 hours No results found for this basename: AMMONIA,  in the last 168 hours CBC:  Recent Labs Lab 01/07/14 0005  WBC 8.3  NEUTROABS 6.8  HGB 12.9*  HCT 39.0  MCV 90.9  PLT 150   Cardiac Enzymes:  Recent Labs Lab 01/07/14 0005  TROPONINI <0.30    BNP (last 3 results)  Recent Labs  11/14/13 1105 11/21/13 1100 01/07/14 0005  PROBNP 1601.0* 226.0* 1118.0*   CBG: No results found for this basename: GLUCAP,  in the last 168 hours  Radiological Exams on Admission: Ct Angio Chest Pe W/cm &/or Wo Cm  01/07/2014   CLINICAL DATA:  Status post angioplasty 12/26/2013. Chest pain and shortness of breath.  EXAM: CT ANGIOGRAPHY CHEST WITH CONTRAST   TECHNIQUE: Multidetector CT imaging of the chest was performed using the standard protocol during bolus administration of intravenous contrast. Multiplanar CT image reconstructions and MIPs were obtained to evaluate the vascular anatomy.  CONTRAST:  191mL OMNIPAQUE IOHEXOL 350 MG/ML SOLN  COMPARISON:  None.  FINDINGS: THORACIC INLET/BODY WALL:  Symmetric gynecomastia.  MEDIASTINUM:  Mild cardiomegaly. Diffuse coronary artery atherosclerosis, status post CABG. Cardiac motion limits assessment of graft patency, especially of the venous grafts. There is no pericardial effusion. No aortic aneurysm or dissection. There is notable noncalcified plaque or thrombus along the posterior mid descending thoracic aorta. Motion and bolus dispersion decreases sensitivity in evaluating the pulmonary arteries beyond the proximal segmental level. There is no evidence of pulmonary embolism.  LUNG WINDOWS:  Moderate centrilobular emphysema. There are small scattered smooth pulmonary nodules, most peripheral:  Right lower lobe, image 43, 6 mm.  Right lower lobe, image 40, 4 mm.  Right middle lobe, image 46, 2 nodules measuring 5 and 6 mm.  No consolidation, effusion, or pneumothorax. There is diffuse bronchial wall thickening and mucoid impaction, likely also smoking-related.  UPPER ABDOMEN:  Partially imaged changes of left nephrectomy.  OSSEOUS:  A lower cervical corpectomy and strut graft is noted.  Review of the MIP images confirms the above findings.  IMPRESSION: 1. Negative for pulmonary embolism to the proximal segmental level. Negative for aortic dissection. 2. COPD. 3. Small pulmonary nodules, measuring up to 6 mm in the right middle and lower lobes. See recommendations below.  RECOMMENDATIONS: Given emphysema, follow-up chest CT at 6 - 12 months is recommended. This recommendation follows the consensus statement: Guidelines for Management of Small Pulmonary Nodules Detected on CT Scans: A Statement from the [employer]  as published in Radiology 2005;237:395-400.   Electronically Signed   By: Jorje Guild M.D.   On: 01/07/2014 02:30   Dg Chest Port 1 View  01/07/2014   CLINICAL DATA:  Chest pain, prior myocardial infarction.  EXAM: PORTABLE  CHEST - 1 VIEW  COMPARISON:  Chest radiograph Nov 14, 2013  FINDINGS: Cardiac silhouette remains mildly enlarged, status post median sternotomy for CABG. Mild chronic interstitial changes, similar without pleural effusions or focal consolidations. No pneumothorax.  Multiple EKG lines overlie the patient and may obscure subtle underlying pathology. Soft tissue planes and included osseous structures are nonsuspicious.  IMPRESSION: Stable cardiomegaly and chronic interstitial changes without superimposed acute pulmonary process.   Electronically Signed   By: Elon Alas   On: 01/07/2014 00:55    EKG: Independently reviewed. Atrial fibrillation rate around 110 beats per minute. Nonspecific ST changes.  Assessment/Plan Active Problems:   Ischemic cardiomyopathy   COPD exacerbation   Chest pain   Chronic systolic congestive heart failure   1. Chest pain with history of CAD status post CABG and stenting - concerning for unstable angina. Patient will be kept n.p.o. Hold apixaban start heparin infusion. Aspirin. Nitroglycerin patch. When necessary Dilaudid. If pain does not improve we'll start Ativan as an infusion. Consult cardiology. 2. Atrial fibrillation - holding apixaban in anticipation of possible cardiac procedure and placing patient on heparin infusion. 3. Chronic systolic heart failure last EF was 30-35% - Lasix 20 mg IV every 12. Closely follow intake output and metabolic panel. 4. COPD exacerbation - patient is on nebulizer and Pulmicort. 5. Peripheral arterial disease status post recent angioplasty - patient still has pain in the lower extremity. Extremities are warm to touch. 6. Anemia - follow CBC. Further workup as outpatient if there is no significant fall in  hemoglobin. 7. Pulmonary nodules - will need further followup as outpatient through primary care. 8. Hyperlipidemia - intolerant to statins.    Code Status: Full code.  Family Communication: None.  Disposition Plan: Admit to inpatient.    KAKRAKANDY,ARSHAD N. [facility] Pager [phone].  If 7PM-7AM, please contact night-coverage [email or web address] Password [email or web address] 01/07/2014, 5:02 AM

## 2014-01-08 DIAGNOSIS — I5033 Acute on chronic diastolic (congestive) heart failure: Secondary | ICD-10-CM

## 2014-01-08 DIAGNOSIS — F4322 Adjustment disorder with anxiety: Secondary | ICD-10-CM

## 2014-01-08 DIAGNOSIS — R0902 Hypoxemia: Secondary | ICD-10-CM

## 2014-01-08 LAB — CBC
HCT: 38.2 % — ABNORMAL LOW (ref 39.0–52.0)
HEMOGLOBIN: 12.6 g/dL — AB (ref 13.0–17.0)
MCH: 30.2 pg (ref 26.0–34.0)
MCHC: 33 g/dL (ref 30.0–36.0)
MCV: 91.6 fL (ref 78.0–100.0)
Platelets: 144 10*3/uL — ABNORMAL LOW (ref 150–400)
RBC: 4.17 MIL/uL — ABNORMAL LOW (ref 4.22–5.81)
RDW: 16.2 % — ABNORMAL HIGH (ref 11.5–15.5)
WBC: 10 10*3/uL (ref 4.0–10.5)

## 2014-01-08 LAB — GLUCOSE, CAPILLARY
GLUCOSE-CAPILLARY: 179 mg/dL — AB (ref 70–99)
GLUCOSE-CAPILLARY: 167 mg/dL — AB (ref 70–99)
GLUCOSE-CAPILLARY: 142 mg/dL — AB (ref 70–99)
GLUCOSE-CAPILLARY: 123 mg/dL — AB (ref 70–99)
GLUCOSE-CAPILLARY: 176 mg/dL — AB (ref 70–99)
Glucose-Capillary: 171 mg/dL — ABNORMAL HIGH (ref 70–99)
Glucose-Capillary: 248 mg/dL — ABNORMAL HIGH (ref 70–99)

## 2014-01-08 LAB — HEPARIN LEVEL (UNFRACTIONATED): Heparin Unfractionated: 0.4 IU/mL (ref 0.30–0.70)

## 2014-01-08 MED ORDER — HYDROCODONE-ACETAMINOPHEN 10-325 MG PO TABS
1.0000 | ORAL_TABLET | ORAL | Status: DC | PRN
  Administered 2014-01-08 – 2014-01-09 (×5): 1 via ORAL
  Filled 2014-01-08 (×5): qty 1

## 2014-01-08 MED ORDER — APIXABAN 5 MG PO TABS
5.0000 mg | ORAL_TABLET | Freq: Two times a day (BID) | ORAL | Status: DC
  Administered 2014-01-08 – 2014-01-09 (×3): 5 mg via ORAL
  Filled 2014-01-08 (×4): qty 1

## 2014-01-08 MED ORDER — RANOLAZINE ER 500 MG PO TB12
500.0000 mg | ORAL_TABLET | Freq: Two times a day (BID) | ORAL | Status: DC
  Administered 2014-01-08 – 2014-01-09 (×2): 500 mg via ORAL
  Filled 2014-01-08 (×4): qty 1

## 2014-01-08 MED ORDER — HYDROMORPHONE HCL 2 MG PO TABS
4.0000 mg | ORAL_TABLET | Freq: Three times a day (TID) | ORAL | Status: DC
  Administered 2014-01-08 – 2014-01-09 (×5): 4 mg via ORAL
  Filled 2014-01-08 (×5): qty 2

## 2014-01-08 NOTE — Progress Notes (Signed)
CARDIOLOGY CONSULT NOTE   Patient ID: Lucas [name] MRN: [id number], DOB/AGE: 1945/09/04   Admit date: 01/06/2014 Date of Consult: 01/08/2014   Primary Physician: Salena Saner., MD Primary Cardiologist: Dr. Jenkins Rouge  Pt. Profile  This 68 year old gentleman with known coronary disease and prior CABG was admitted with left-sided chest discomfort and with pain in the right lower extremity.  He has had several heart caths this past year - the last one on Feb. 2015. His CP is atypical - constant, not relieved with NTG.   Problem List  Past Medical History  Diagnosis Date  . CAD (coronary artery disease)     a. s/p CABG x 5;  b. 04/2012 Cath: patent LIMA->LAD and VG->OM3, all other grafts occluded.  . Hypertension   . Anxiety   . History of kidney cancer   . Adjustment disorder with anxiety   . Hyperlipidemia   . BPH (benign prostatic hypertrophy)   . COPD (chronic obstructive pulmonary disease)     a. on home O2.  . Arthritis   . Ischemic cardiomyopathy     a. 06/2013 Echo: EF 30-35%, no reg wma's, Gr2 DD, triv AI, mod dil LA, nl RV fxn.  . Chronic systolic CHF (congestive heart failure)     a. 06/2013 Echo: EF 30-35%.  . ADENOCARCINOMA, PROSTATE dx'd 2012  . Renal cancer dx'd 1997    lt nephrectomy    Past Surgical History  Procedure Laterality Date  . Coronary artery bypass graft      x 5  . Cardiac catheterization    . Nephrectomy      left nephrectomy for ca  . Posterior cervical laminectomy      x 8   limited ROM  and can't lie flat  . Heart stents      x 5  . Robot assisted laparoscopic radical prostatectomy      for prostate cancer  . Cystoscopy with litholapaxy  05/01/2012    Procedure: CYSTOSCOPY WITH LITHOLAPAXY;  Surgeon: Dutch Gray, MD;  Location: [facility];  Service: Urology;  Laterality: N/A;  . Prostate cancer    . Kidney surgery Left 1994     Allergies  Allergies  Allergen Reactions  . Ativan [Lorazepam] Other (See Comments)   Increases agitation, tolerates xanax  . Lisinopril Cough  . Morphine And Related     Hallucinations, too sedated when given with ativan  . Ibuprofen Hives, Nausea And Vomiting and Rash    Tolerates baby aspirin per patient.      HPI   This 68 year old gentleman has known ischemic heart disease.  He has a history of ischemic cardiomyopathy.  He has a history of CABG.  He has a past history of congestive heart failure.  His last cardiac catheterization was in August 21 2013.  He had the following: 1. Severe three-vessel coronary artery disease  2. 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  3. Normal intracardiac hemodynamics  Recommendations: Continued medical therapy. His coronary anatomy is stable from his prior cardiac catheterization. The patient also has severe peripheral vascular disease.  Vascular access has been very difficult.  On 12/25/13 the patient underwent lower extremity catheterization by Dr. Trula Slade for right leg claudication: The findings were as follows: #1 high-grade, greater than 80% proximal right external iliac artery stenosis, successfully treated using a 6 x 40 chocolate balloon  #  2 right superficial femoral artery occlusion with reconstitution of the above-knee popliteal artery at the level of the patella  #3 two-vessel runoff via the peroneal and posterior tibial artery The patient was admitted last evening because of left-sided chest discomfort.  The pain was worse with taking a deep breath.  A CT angiogram of the chest in the emergency room showed no pulmonary emboli.  The patient has been on a apixaban at home.  He is now able to obtain the drug at a cost which is affordable to him and he has been compliant in taking it.  Today his chest pain is improved.  His main concern is his right leg discomfort.  His initial troponins are normal.  His EKG  shows no acute changes from prior tracings.  He appears to be euvolemic today and does not have any peripheral edema.   Inpatient Medications  . aspirin EC  325 mg Oral Daily  . budesonide (PULMICORT) nebulizer solution  0.25 mg Nebulization BID  . doxazosin  8 mg Oral QHS  . furosemide  20 mg Intravenous BID  . ipratropium-albuterol  3 mL Nebulization Q6H  . irbesartan  37.5 mg Oral Daily  . isosorbide mononitrate  30 mg Oral Daily  . methylPREDNISolone (SOLU-MEDROL) injection  60 mg Intravenous Q6H  . sodium chloride  3 mL Intravenous Q12H  . sodium chloride  3 mL Intravenous Q12H  . sodium chloride  3 mL Intravenous Q12H    Family History Family History  Problem Relation Age of Onset  . Diabetes Mother   . Heart disease Mother   . Hypertension Mother   . Heart attack Mother   . Coronary artery disease    . Heart disease Father   . Diabetes Father   . Heart attack Father   . Heart disease Brother   . Heart attack Brother   . COPD Sister      Social History History   Social History  . Marital Status: Widowed    Spouse Name: N/A    Number of Children: N/A  . Years of Education: N/A   Occupational History  . disabled    Social History Main Topics  . Smoking status: Former Smoker -- 0.30 packs/day for 54 years    Types: Cigarettes    Quit date: 03/29/2013  . Smokeless tobacco: Former Systems developer    Quit date: 04/19/2012     Comment: Using e-cig now  . Alcohol Use: No  . Drug Use: No  . Sexual Activity: Not Currently   Other Topics Concern  . Not on file   Social History Narrative    He lives in [place] by himself.  He is a widower.    He continues to smoke about 4 or 5  cigarettes a day but has a greater than 100 pack-year history having  previously smoked up to 3-4 packs a day over the span about 48 years.     He denies alcohol or drug use.  He is retired secondary to disability     since the 12s.      Physical Exam  Blood pressure 145/68, pulse 76,  temperature 97.6 F (36.4 C), temperature source Oral, resp. rate 18, height 5\' 9"  (1.753 m), weight 216 lb 6.4 oz (98.158 kg), SpO2 93.00%.  General: Pleasant, NAD Psych: Normal affect. Neuro: Alert and oriented X 3. Moves all extremities spontaneously. HEENT: Normal  Neck: Supple without bruits or JVD. Lungs:  Bilateral wheezing Heart: RRR no s3, s4,  or murmurs. Abdomen: Soft, non-tender, non-distended, BS + x 4.  Extremities: No clubbing, cyanosis or edema.  Pedal pulses are equivocal.  Labs   Recent Labs  01/07/14 0005 01/07/14 0635 01/07/14 1345 01/07/14 1952  TROPONINI <0.30 <0.30 <0.30 <0.30   Lab Results  Component Value Date   WBC 10.0 01/08/2014   HGB 12.6* 01/08/2014   HCT 38.2* 01/08/2014   MCV 91.6 01/08/2014   PLT 144* 01/08/2014     Recent Labs Lab 01/07/14 0635  NA 141  K 4.6  CL 101  CO2 23  BUN 30*  CREATININE 1.34  CALCIUM 8.7  PROT 7.5  BILITOT 0.3  ALKPHOS 52  ALT 14  AST 23  GLUCOSE 115*   Lab Results  Component Value Date   CHOL 208* 07/01/2013   HDL 34* 07/01/2013   LDLCALC 145* 07/01/2013   TRIG 144 07/01/2013   Lab Results  Component Value Date   DDIMER 0.72* 07/01/2013      ECG  Normal sinus rhythm.  Old anteroseptal myocardial infarction.  No acute changes.  ASSESSMENT AND PLAN 1. ischemic heart disease status post CABG x5.  Most recent cardiac catheterization February 2015.  Vascular status felt to be stable and continued medical therapy advised.  He has difficult vascular access.  At this point, I do not think that he needs another cath .  Would continue with medical therapy.  It does not sound like increasing  Imdur will necessarily help since his presenting CP was not responsive to NTG.   We can try increasing .  May try Ranexa.    Can DC heparin Continue medical management.     2. severe peripheral arterial occlusive disease status post recent right external iliac artery stenosis successfully treated with  angioplasty Dr. Trula Slade. 3. COPD 4. ischemic cardiomyopathy with last echo 11/08/13 showing EF of 40-45% with wall motion abnormalities.   Thayer Headings, Brooke Bonito., MD, [facility] 01/08/2014, 7:46 AM [address],  [place] Pager [phone]

## 2014-01-08 NOTE — Progress Notes (Signed)
ANTICOAGULATION CONSULT NOTE - Follow Up Consult  Pharmacy Consult for Heparin Indication: chest pain/ACS  Allergies  Allergen Reactions  . Ativan [Lorazepam] Other (See Comments)    Increases agitation, tolerates xanax  . Lisinopril Cough  . Morphine And Related     Hallucinations, too sedated when given with ativan  . Ibuprofen Hives, Nausea And Vomiting and Rash    Tolerates baby aspirin per patient.      Patient Measurements: Height: 5\' 9"  (175.3 cm) Weight: 216 lb 6.4 oz (98.158 kg) IBW/kg (Calculated) : 70.7 Heparin Dosing Weight:   Vital Signs: Temp: 97.6 F (36.4 C) (06/30 0416) Temp src: Oral (06/30 0416) BP: 145/68 mmHg (06/30 0416) Pulse Rate: 76 (06/30 0416)  Labs:  Recent Labs  01/07/14 0005 01/07/14 0219 01/07/14 3474 01/07/14 1345 01/07/14 1952 01/07/14 2151 01/08/14 0410  HGB 12.9*  --  12.4*  --   --   --  12.6*  HCT 39.0  --  37.6*  --   --   --  38.2*  PLT 150  --  159  --   --   --  144*  APTT  --   --   --  56*  --  53*  --   HEPARINUNFRC  --   --   --  0.46  --  0.39  --   CREATININE 1.25 1.27 1.34  --   --   --   --   TROPONINI <0.30  --  <0.30 <0.30 <0.30  --   --     Estimated Creatinine Clearance: 61 ml/min (by C-G formula based on Cr of 1.34).   Medications:  Infusions:  . heparin 1,150 Units/hr (01/07/14 2032)    Assessment: Patient with heparin level at goal again.  No issues noted.    Goal of Therapy:  Heparin level 0.3-0.7 units/ml Monitor platelets by anticoagulation protocol: Yes   Plan:  Continue heparin drip at current rate Recheck level with am Labs  Tyler Deis, Shea Stakes [place] 01/08/2014,5:10 AM

## 2014-01-08 NOTE — Progress Notes (Signed)
Per CMT, patient had a long run of V-tach. Patient experiencing chest pain and shortness of breath, but this is baseline for patient this admission and unchanged at this time. Dr. Wyline Copas notified, no new orders. BP 159/74 and HR 79. Will continue to monitor patient.

## 2014-01-08 NOTE — Progress Notes (Signed)
[facility] PROGRESS NOTE  Lucas [name] XHB:[id number] DOB: 04-15-1946 DOA: 01/06/2014 PCP: Salena Saner., MD  Assessment/Plan: 1. Chest pain with history of CAD status post CABG and stenting - initially concerning for unstable angina. Given cardiac hx, consulted Cardiology with recs for maximizing medical management. Pt still complaining of chest pain. Suspect chest wall pain in setting of COPD exacerbation 2. Atrial fibrillation - holding apixaban and started heparin infusion. Rate controlled 3. Chronic systolic heart failure last EF was 30-35% - Lasix 20 mg IV every 12. Closely follow intake output and metabolic panel. 4. COPD exacerbation - patient is on nebulizer and Pulmicort. Was on 3L [place] (baseline 2L [place] prior to admit). Now on min o2 support. Cont steroids and nebs 5. Peripheral arterial disease status post recent angioplasty - LE tenderness. Extremities are warm to touch. Followed by Vascular surgery 6. Anemia - follow CBC. Stable currently 7. Pulmonary nodules - will need further followup as outpatient through primary care. 8. Hyperlipidemia - intolerant to statins.  Code Status: Full Family Communication: Pt in room Disposition Plan: Pending   Consultants:  Cardiology  Procedures:    Antibiotics:   HPI/Subjective: Cont to complain of chest pain, not improved with imdur  Objective: Filed Vitals:   01/07/14 2115 01/08/14 0224 01/08/14 0416 01/08/14 0813  BP:   145/68   Pulse:   76   Temp: 98.2 F (36.8 C)  97.6 F (36.4 C)   TempSrc: Oral  Oral   Resp:   18   Height:      Weight:   216 lb 6.4 oz (98.158 kg)   SpO2:  94% 93% 92%    Intake/Output Summary (Last 24 hours) at 01/08/14 0916 Last data filed at 01/08/14 0700  Gross per 24 hour  Intake 959.25 ml  Output   1825 ml  Net -865.75 ml   Filed Weights   01/07/14 0443 01/08/14 0416  Weight: 214 lb 12.8 oz (97.433 kg) 216 lb 6.4 oz (98.158 kg)    Exam:   General:  Awake, in  nad  Cardiovascular: regular, s1, s2  Respiratory: normal resp effort, end-expiratory wheezing, decreased air movement  Abdomen: soft, obese, nondistended  Musculoskeletal: perfused, no clubbing   Data Reviewed: Basic Metabolic Panel:  Recent Labs Lab 01/07/14 0005 01/07/14 0219 01/07/14 0635  NA 140 139 141  K 4.4 4.6 4.6  CL 102 102 101  CO2 21 22 23   GLUCOSE 118* 120* 115*  BUN 27* 29* 30*  CREATININE 1.25 1.27 1.34  CALCIUM 9.1 8.9 8.7   Liver Function Tests:  Recent Labs Lab 01/07/14 0635  AST 23  ALT 14  ALKPHOS 52  BILITOT 0.3  PROT 7.5  ALBUMIN 3.9   No results found for this basename: LIPASE, AMYLASE,  in the last 168 hours No results found for this basename: AMMONIA,  in the last 168 hours CBC:  Recent Labs Lab 01/07/14 0005 01/07/14 0635 01/08/14 0410  WBC 8.3 9.0 10.0  NEUTROABS 6.8 7.3  --   HGB 12.9* 12.4* 12.6*  HCT 39.0 37.6* 38.2*  MCV 90.9 91.7 91.6  PLT 150 159 144*   Cardiac Enzymes:  Recent Labs Lab 01/07/14 0005 01/07/14 0635 01/07/14 1345 01/07/14 1952  TROPONINI <0.30 <0.30 <0.30 <0.30   BNP (last 3 results)  Recent Labs  11/14/13 1105 11/21/13 1100 01/07/14 0005  PROBNP 1601.0* 226.0* 1118.0*   CBG:  Recent Labs Lab 01/07/14 1649 01/07/14 2004 01/08/14 0022 01/08/14 0412 01/08/14 0728  GLUCAP 173*  229* 248* 179* 167*    No results found for this or any previous visit (from the past 240 hour(s)).   Studies: Ct Angio Chest Pe W/cm &/or Wo Cm  01/07/2014   CLINICAL DATA:  Status post angioplasty 12/26/2013. Chest pain and shortness of breath.  EXAM: CT ANGIOGRAPHY CHEST WITH CONTRAST  TECHNIQUE: Multidetector CT imaging of the chest was performed using the standard protocol during bolus administration of intravenous contrast. Multiplanar CT image reconstructions and MIPs were obtained to evaluate the vascular anatomy.  CONTRAST:  161mL OMNIPAQUE IOHEXOL 350 MG/ML SOLN  COMPARISON:  None.  FINDINGS:  THORACIC INLET/BODY WALL:  Symmetric gynecomastia.  MEDIASTINUM:  Mild cardiomegaly. Diffuse coronary artery atherosclerosis, status post CABG. Cardiac motion limits assessment of graft patency, especially of the venous grafts. There is no pericardial effusion. No aortic aneurysm or dissection. There is notable noncalcified plaque or thrombus along the posterior mid descending thoracic aorta. Motion and bolus dispersion decreases sensitivity in evaluating the pulmonary arteries beyond the proximal segmental level. There is no evidence of pulmonary embolism.  LUNG WINDOWS:  Moderate centrilobular emphysema. There are small scattered smooth pulmonary nodules, most peripheral:  Right lower lobe, image 43, 6 mm.  Right lower lobe, image 40, 4 mm.  Right middle lobe, image 46, 2 nodules measuring 5 and 6 mm.  No consolidation, effusion, or pneumothorax. There is diffuse bronchial wall thickening and mucoid impaction, likely also smoking-related.  UPPER ABDOMEN:  Partially imaged changes of left nephrectomy.  OSSEOUS:  A lower cervical corpectomy and strut graft is noted.  Review of the MIP images confirms the above findings.  IMPRESSION: 1. Negative for pulmonary embolism to the proximal segmental level. Negative for aortic dissection. 2. COPD. 3. Small pulmonary nodules, measuring up to 6 mm in the right middle and lower lobes. See recommendations below.  RECOMMENDATIONS: Given emphysema, follow-up chest CT at 6 - 12 months is recommended. This recommendation follows the consensus statement: Guidelines for Management of Small Pulmonary Nodules Detected on CT Scans: A Statement from the [place] as published in Radiology 2005;237:395-400.   Electronically Signed   By: Jorje Guild M.D.   On: 01/07/2014 02:30   Dg Chest Port 1 View  01/07/2014   CLINICAL DATA:  Chest pain, prior myocardial infarction.  EXAM: PORTABLE CHEST - 1 VIEW  COMPARISON:  Chest radiograph Nov 14, 2013  FINDINGS: Cardiac silhouette  remains mildly enlarged, status post median sternotomy for CABG. Mild chronic interstitial changes, similar without pleural effusions or focal consolidations. No pneumothorax.  Multiple EKG lines overlie the patient and may obscure subtle underlying pathology. Soft tissue planes and included osseous structures are nonsuspicious.  IMPRESSION: Stable cardiomegaly and chronic interstitial changes without superimposed acute pulmonary process.   Electronically Signed   By: Elon Alas   On: 01/07/2014 00:55    Scheduled Meds: . apixaban  5 mg Oral BID  . aspirin EC  325 mg Oral Daily  . budesonide (PULMICORT) nebulizer solution  0.25 mg Nebulization BID  . doxazosin  8 mg Oral QHS  . furosemide  20 mg Intravenous BID  . ipratropium-albuterol  3 mL Nebulization Q6H  . irbesartan  37.5 mg Oral Daily  . isosorbide mononitrate  30 mg Oral Daily  . methylPREDNISolone (SOLU-MEDROL) injection  60 mg Intravenous Q6H  . ranolazine  500 mg Oral BID  . sodium chloride  3 mL Intravenous Q12H  . sodium chloride  3 mL Intravenous Q12H  . sodium chloride  3 mL Intravenous  Q12H   Continuous Infusions:    Active Problems:   Ischemic cardiomyopathy   COPD exacerbation   Chest pain   Chronic systolic congestive heart failure   Time spent: 57min   CHIU, [facility] Pager [phone]. If 7PM-7AM, please contact night-coverage at [email or web address], password [facility] 01/08/2014, 9:16 AM  LOS: 2 days

## 2014-01-08 NOTE — Progress Notes (Signed)
Received referral from [facility] program for [facility]. He was recently active with [facility] but was discharged for noncompliance. Spoke with patient at bedside and he endorses that he has been compliant with taking his medications and following up with his doctors. Discussed re-enrolling in [facility]. He pleasantly declines stating " I have enough going on right now". Appreciative of visit however. Made him aware that he can call if he changes his mind. Made inpatient RNCM aware of visit.  Marthenia Rolling, MSN- [facility] [phone]

## 2014-01-09 LAB — GLUCOSE, CAPILLARY: Glucose-Capillary: 157 mg/dL — ABNORMAL HIGH (ref 70–99)

## 2014-01-09 LAB — CBC
HCT: 35.6 % — ABNORMAL LOW (ref 39.0–52.0)
HEMOGLOBIN: 11.8 g/dL — AB (ref 13.0–17.0)
MCH: 30.5 pg (ref 26.0–34.0)
MCHC: 33.1 g/dL (ref 30.0–36.0)
MCV: 92 fL (ref 78.0–100.0)
PLATELETS: 148 10*3/uL — AB (ref 150–400)
RBC: 3.87 MIL/uL — ABNORMAL LOW (ref 4.22–5.81)
RDW: 16.7 % — AB (ref 11.5–15.5)
WBC: 15.8 10*3/uL — ABNORMAL HIGH (ref 4.0–10.5)

## 2014-01-09 LAB — BASIC METABOLIC PANEL
BUN: 45 mg/dL — ABNORMAL HIGH (ref 6–23)
CALCIUM: 8.9 mg/dL (ref 8.4–10.5)
CO2: 23 mEq/L (ref 19–32)
CREATININE: 1.53 mg/dL — AB (ref 0.50–1.35)
Chloride: 94 mEq/L — ABNORMAL LOW (ref 96–112)
GFR calc non Af Amer: 45 mL/min — ABNORMAL LOW (ref >90)
GFR, EST AFRICAN AMERICAN: 52 mL/min — AB (ref >90)
Glucose, Bld: 145 mg/dL — ABNORMAL HIGH (ref 70–99)
Potassium: 4.5 mEq/L (ref 3.7–5.3)
Sodium: 133 mEq/L — ABNORMAL LOW (ref 137–147)

## 2014-01-09 MED ORDER — METHYLPREDNISOLONE SODIUM SUCC 125 MG IJ SOLR
60.0000 mg | Freq: Two times a day (BID) | INTRAMUSCULAR | Status: DC
  Administered 2014-01-09: 60 mg via INTRAVENOUS
  Filled 2014-01-09 (×2): qty 0.96

## 2014-01-09 MED ORDER — IPRATROPIUM-ALBUTEROL 0.5-2.5 (3) MG/3ML IN SOLN
3.0000 mL | Freq: Four times a day (QID) | RESPIRATORY_TRACT | Status: DC
  Administered 2014-01-09 (×2): 3 mL via RESPIRATORY_TRACT
  Filled 2014-01-09 (×2): qty 3

## 2014-01-09 MED ORDER — ISOSORBIDE MONONITRATE ER 30 MG PO TB24: 30.0000 mg | ORAL_TABLET | Freq: Every day | ORAL | Status: DC

## 2014-01-09 MED ORDER — RANOLAZINE ER 500 MG PO TB12: 500.0000 mg | ORAL_TABLET | Freq: Two times a day (BID) | ORAL | Status: DC

## 2014-01-09 MED ORDER — PREDNISONE 20 MG PO TABS: 60.0000 mg | ORAL_TABLET | Freq: Every day | ORAL | Status: DC

## 2014-01-09 MED ORDER — FUROSEMIDE 40 MG PO TABS: 20.0000 mg | ORAL_TABLET | Freq: Two times a day (BID) | ORAL | Status: DC

## 2014-01-09 MED ORDER — ASPIRIN 81 MG PO CHEW
81.0000 mg | CHEWABLE_TABLET | Freq: Every day | ORAL | Status: DC
  Administered 2014-01-09: 81 mg via ORAL
  Filled 2014-01-09: qty 1

## 2014-01-09 NOTE — Discharge Summary (Signed)
Physician Discharge Summary  Lucas [name] ZOX:[id number] DOB: September 17, 1945 DOA: 01/06/2014  PCP: Salena Saner., MD  Admit date: 01/06/2014 Discharge date: 01/09/2014  Time spent: 65 minutes  Recommendations for Outpatient Follow-up:  1. Follow up with Salena Saner., MD as scheduled. 2. Followup with Dr. Johnsie Cancel of cardiology in 2 weeks. 3. Followup with Dr. Chase Caller of pulmonary in 1-2 weeks.  Discharge Diagnoses:  Principal Problem:   Chest pain Active Problems:   Acute exacerbation of chronic obstructive pulmonary disease (COPD)   HYPERTENSION   Ischemic cardiomyopathy   Chronic systolic CHF (congestive heart failure)   COPD exacerbation   A-fib   PAD (peripheral artery disease)   Chronic systolic congestive heart failure   Discharge Condition: Stable and improved  Diet recommendation: Heart healthy  Filed Weights   01/07/14 0443 01/08/14 0416 01/09/14 0417  Weight: 97.433 kg (214 lb 12.8 oz) 98.158 kg (216 lb 6.4 oz) 101.061 kg (222 lb 12.8 oz)    History of present illness:  Lucas [name] is a 68 y.o. male with history of CAD status post CABG status post stenting, ischemic cardiomyopathy, COPD, hyperlipidemia, peripheral arterial disease, prostate cancer and renal cell cancer presents to the ER because of chest pain. Patient has been having left-sided chest pain radiating to the back and neck since 6 PM last evening. Pain is pressure-like increases on deep inspiration and has associated shortness of breath and nausea denies any diaphoresis palpitations dizziness or vomiting. Patient has had recent right lower extremity angioplasty. On exam patient has mild lower extremity edema and in the ER patient had undergone CT angiogram of the chest which was negative for PE. Patient still has chest pain and was placed on fentanyl IV for which patient's chest pain improved. Cardiac markers have been negative her EKG shows atrial fibrillation. Patient denies any headache  visual symptoms focal deficits abdominal pain.      Hospital Course:  #1 chest pain Patient with a complex cardiac history of coronary artery disease status post CABG with stenting, ischemic cardiomyopathy, peripheral artery disease were presented to the ED with left substernal chest pain and pain in the right lower extremity. Patient described his chest pain is pressure-like. CT angiogram of the chest which was done was negative for PE. Cardiology consultation was obtained and patient was seen in consultation by Dr. Mare Ferrari on 01/07/2014. She was noted to have multiple cardiac catheterizations with recent cardiac catheterization February 2015. Patient also had right external iliac artery stenosis successfully treated with angioplasty per vascular surgery. Patient was noted to have an EF of 40-45% per 2-D echo 11/08/2013. It was felt per cardiology that patient did not need another cardiac catheterization. Was recommended to continue medical management. Imdur was added to patient's regimen. Patient had initially been placed on IV heparin and patient's apixiban was initially held. It was felt that patient's chest pain was atypical and was not relieved by nitroglycerin. It was felt his chest pain was atypical. Ranexa was added to patient's regimen. Patient improved clinically and patient be discharged home to followup with cardiology as outpatient. Patient was discharged in stable and improved condition.  #2 chronic pain Patient was noted to have a history of chronic pain. Patient was maintained on his home regimen of pain management. Patient will followup with PCP as outpatient.  #3 paroxysmal atrial fibrillation Patient remained rate controlled during the hospitalization. Patient was initially placed on IV heparin secondary to problem #1. Patient's apixiban was held. Once it was decided that patient  was not going to under go any further cardiac workup IV heparin was discontinued and patient apixiban  was resumed. Patient will followup with cardiology as outpatient.  #4 COPD exacerbation On admission is some concern that patient may have had a COPD exacerbation. Patient was placed on IV steroids, and nebulizer treatments. Patient improved clinically. Patient was subsequently tapered down on his IV Solu-Medrol. Patient be discharged home on oral steroid taper as well as his nebulizer treatments. Patient is to followup with pulmonary as outpatient.  #5 peripheral artery disease status post recent angioplasty Remained stable during the hospitalization.  #6 chronic systolic heart failure Patient was initially placed on IV Lasix every 12 hours. Patient was subsequently transitioned to oral Lasix. Due to a bump in pressure his creatinine his Lasix was held. Patient is to resume his home dose regimen 2 days post discharge. Patient is to followup with cardiology as outpatient.  Procedures:  CT angiogram chest 01/07/2014  Chest x-ray 01/07/2014  Consultations:  Cardiology: Dr. Mare Ferrari 01/07/2014  Discharge Exam: Filed Vitals:   01/09/14 1337  BP: 125/79  Pulse: 84  Temp: 97.2 F (36.2 C)  Resp: 20    General: NAD Cardiovascular: RRR Respiratory: CTAB  Discharge Instructions You were cared for by a hospitalist during your hospital stay. If you have any questions about your discharge medications or the care you received while you were in the hospital after you are discharged, you can call the unit and asked to speak with the hospitalist on call if the hospitalist that took care of you is not available. Once you are discharged, your primary care physician will handle any further medical issues. Please note that NO REFILLS for any discharge medications will be authorized once you are discharged, as it is imperative that you return to your primary care physician (or establish a relationship with a primary care physician if you do not have one) for your aftercare needs so that they can  reassess your need for medications and monitor your lab values.      Discharge Instructions   Diet - low sodium heart healthy    Complete by:  As directed      Discharge instructions    Complete by:  As directed   Follow up with Salena Saner., MD as scheduled. Follow up with Dr Johnsie Cancel of cardiology in 2 weeks. Follow up with Dr Chase Caller in 2 weeks.     Increase activity slowly    Complete by:  As directed             Medication List         albuterol (2.5 MG/3ML) 0.083% nebulizer solution  Commonly known as:  PROVENTIL  Take 2.5 mg by nebulization 4 (four) times daily.     albuterol 108 (90 BASE) MCG/ACT inhaler  Commonly known as:  PROVENTIL HFA;VENTOLIN HFA  Inhale 2 puffs into the lungs every 6 (six) hours as needed for wheezing or shortness of breath. Shortness of breath     ALPRAZolam 1 MG tablet  Commonly known as:  XANAX  Take 1 tablet (1 mg total) by mouth 3 (three) times daily as needed for anxiety.     apixaban 5 MG Tabs tablet  Commonly known as:  ELIQUIS  Take 1 tablet (5 mg total) by mouth 2 (two) times daily.     aspirin EC 81 MG tablet  Take 81 mg by mouth daily.     doxazosin 8 MG tablet  Commonly known as:  CARDURA  Take 8 mg by mouth at bedtime.     furosemide 40 MG tablet  Commonly known as:  LASIX  Take 0.5 tablets (20 mg total) by mouth 2 (two) times daily. Resume in 2 days.  Start taking on:  01/11/2014     HYDROcodone-acetaminophen 10-325 MG per tablet  Commonly known as:  NORCO  Take 1 tablet by mouth every 4 (four) hours as needed for moderate pain.     HYDROmorphone 4 MG tablet  Commonly known as:  DILAUDID  Take 4 mg by mouth 3 (three) times daily.     ipratropium 0.02 % nebulizer solution  Commonly known as:  ATROVENT  Take 0.5 mg by nebulization 4 (four) times daily.     Ipratropium-Albuterol 20-100 MCG/ACT Aers respimat  Commonly known as:  COMBIVENT RESPIMAT  Inhale 1 puff into the lungs every 6 (six) hours as needed  for wheezing or shortness of breath.     isosorbide mononitrate 30 MG 24 hr tablet  Commonly known as:  IMDUR  Take 1 tablet (30 mg total) by mouth daily.     mometasone-formoterol 100-5 MCG/ACT Aero  Commonly known as:  DULERA  Inhale 2 puffs into the lungs 2 (two) times daily.     NITROSTAT 0.3 MG SL tablet  Generic drug:  nitroGLYCERIN  Place 0.3 mg under the tongue every 5 (five) minutes as needed for chest pain.     NON FORMULARY  Place 2 L into the nose at bedtime. O2 at night only     predniSONE 10 MG tablet  Commonly known as:  DELTASONE  Take 1 tablet by mouth as needed (for fluid retention).     predniSONE 20 MG tablet  Commonly known as:  DELTASONE  Take 3 tablets (60 mg total) by mouth daily with breakfast. Take 3 tablets ( 60mg )  2 times daily x 2 days, then 3 tablets (60mg ) daily x 2 days, then 2 tablets (40mg ) daily x 2 days, then 1 tablet (20mg ) daily x 2 days then stop.     ranolazine 500 MG 12 hr tablet  Commonly known as:  RANEXA  Take 1 tablet (500 mg total) by mouth 2 (two) times daily.     valsartan 40 MG tablet  Commonly known as:  DIOVAN  Take 40 mg by mouth daily.       Allergies  Allergen Reactions  . Ativan [Lorazepam] Other (See Comments)    Increases agitation, tolerates xanax  . Lisinopril Cough  . Morphine And Related     Hallucinations, too sedated when given with ativan  . Ibuprofen Hives, Nausea And Vomiting and Rash    Tolerates baby aspirin per patient.     Follow-up Information   Follow up with Salena Saner., MD. (f/u as scheduled.)    Specialty:  Internal Medicine   Contact information:   [address] [place] [address] [phone]       Follow up with Jenkins Rouge, MD. Schedule an appointment as soon as possible for a visit in 2 weeks.   Specialty:  Cardiology   Contact information:   [address] [place] [address] [phone]       Follow up with [facility], MD.  Schedule an appointment as soon as possible for a visit in 1 week. (f/u in 1-2 weeks.)    Specialty:  Pulmonary Disease   Contact information:   [place] [address] [phone]  The results of significant diagnostics from this hospitalization (including imaging, microbiology, ancillary and laboratory) are listed below for reference.    Significant Diagnostic Studies: Ct Angio Chest Pe W/cm &/or Wo Cm  01/07/2014   CLINICAL DATA:  Status post angioplasty 12/26/2013. Chest pain and shortness of breath.  EXAM: CT ANGIOGRAPHY CHEST WITH CONTRAST  TECHNIQUE: Multidetector CT imaging of the chest was performed using the standard protocol during bolus administration of intravenous contrast. Multiplanar CT image reconstructions and MIPs were obtained to evaluate the vascular anatomy.  CONTRAST:  135mL OMNIPAQUE IOHEXOL 350 MG/ML SOLN  COMPARISON:  None.  FINDINGS: THORACIC INLET/BODY WALL:  Symmetric gynecomastia.  MEDIASTINUM:  Mild cardiomegaly. Diffuse coronary artery atherosclerosis, status post CABG. Cardiac motion limits assessment of graft patency, especially of the venous grafts. There is no pericardial effusion. No aortic aneurysm or dissection. There is notable noncalcified plaque or thrombus along the posterior mid descending thoracic aorta. Motion and bolus dispersion decreases sensitivity in evaluating the pulmonary arteries beyond the proximal segmental level. There is no evidence of pulmonary embolism.  LUNG WINDOWS:  Moderate centrilobular emphysema. There are small scattered smooth pulmonary nodules, most peripheral:  Right lower lobe, image 43, 6 mm.  Right lower lobe, image 40, 4 mm.  Right middle lobe, image 46, 2 nodules measuring 5 and 6 mm.  No consolidation, effusion, or pneumothorax. There is diffuse bronchial wall thickening and mucoid impaction, likely also smoking-related.  UPPER ABDOMEN:  Partially imaged changes of left nephrectomy.  OSSEOUS:  A lower  cervical corpectomy and strut graft is noted.  Review of the MIP images confirms the above findings.  IMPRESSION: 1. Negative for pulmonary embolism to the proximal segmental level. Negative for aortic dissection. 2. COPD. 3. Small pulmonary nodules, measuring up to 6 mm in the right middle and lower lobes. See recommendations below.  RECOMMENDATIONS: Given emphysema, follow-up chest CT at 6 - 12 months is recommended. This recommendation follows the consensus statement: Guidelines for Management of Small Pulmonary Nodules Detected on CT Scans: A Statement from the [place] as published in Radiology 2005;237:395-400.   Electronically Signed   By: Jorje Guild M.D.   On: 01/07/2014 02:30   Dg Chest Port 1 View  01/07/2014   CLINICAL DATA:  Chest pain, prior myocardial infarction.  EXAM: PORTABLE CHEST - 1 VIEW  COMPARISON:  Chest radiograph Nov 14, 2013  FINDINGS: Cardiac silhouette remains mildly enlarged, status post median sternotomy for CABG. Mild chronic interstitial changes, similar without pleural effusions or focal consolidations. No pneumothorax.  Multiple EKG lines overlie the patient and may obscure subtle underlying pathology. Soft tissue planes and included osseous structures are nonsuspicious.  IMPRESSION: Stable cardiomegaly and chronic interstitial changes without superimposed acute pulmonary process.   Electronically Signed   By: Elon Alas   On: 01/07/2014 00:55    Microbiology: No results found for this or any previous visit (from the past 240 hour(s)).   Labs: Basic Metabolic Panel:  Recent Labs Lab 01/07/14 0005 01/07/14 0219 01/07/14 0635 01/09/14 0508  NA 140 139 141 133*  K 4.4 4.6 4.6 4.5  CL 102 102 101 94*  CO2 21 22 23 23   GLUCOSE 118* 120* 115* 145*  BUN 27* 29* 30* 45*  CREATININE 1.25 1.27 1.34 1.53*  CALCIUM 9.1 8.9 8.7 8.9   Liver Function Tests:  Recent Labs Lab 01/07/14 0635  AST 23  ALT 14  ALKPHOS 52  BILITOT 0.3  PROT 7.5   ALBUMIN 3.9   No  results found for this basename: LIPASE, AMYLASE,  in the last 168 hours No results found for this basename: AMMONIA,  in the last 168 hours CBC:  Recent Labs Lab 01/07/14 0005 01/07/14 0635 01/08/14 0410 01/09/14 0508  WBC 8.3 9.0 10.0 15.8*  NEUTROABS 6.8 7.3  --   --   HGB 12.9* 12.4* 12.6* 11.8*  HCT 39.0 37.6* 38.2* 35.6*  MCV 90.9 91.7 91.6 92.0  PLT 150 159 144* 148*   Cardiac Enzymes:  Recent Labs Lab 01/07/14 0005 01/07/14 0635 01/07/14 1345 01/07/14 1952  TROPONINI <0.30 <0.30 <0.30 <0.30   BNP: BNP (last 3 results)  Recent Labs  11/14/13 1105 11/21/13 1100 01/07/14 0005  PROBNP 1601.0* 226.0* 1118.0*   CBG:  Recent Labs Lab 01/08/14 1151 01/08/14 1717 01/08/14 1928 01/08/14 2337 01/09/14 0415  GLUCAP 142* 123* 176* 171* 157*       Signed:  THOMPSON,DANIEL MD [facility] 01/09/2014, 4:05 PM

## 2014-01-09 NOTE — Progress Notes (Addendum)
CARDIOLOGY CONSULT NOTE   Patient ID: Lucas [name] MRN: [id number], DOB/AGE: 69-Jan-1947   Admit date: 01/06/2014 Date of Consult: 01/09/2014   Primary Physician: Salena Saner., MD Primary Cardiologist: Dr. Jenkins Rouge  Pt. Profile  This 68 year old gentleman with known coronary disease and prior CABG was admitted with left-sided chest discomfort and with pain in the right lower extremity.  He has had several heart caths this past year - the last one on Feb. 2015. His CP is atypical - constant, not relieved with NTG.   Problem List  Past Medical History  Diagnosis Date  . CAD (coronary artery disease)     a. s/p CABG x 5;  b. 04/2012 Cath: patent LIMA->LAD and VG->OM3, all other grafts occluded.  . Hypertension   . Anxiety   . History of kidney cancer   . Adjustment disorder with anxiety   . Hyperlipidemia   . BPH (benign prostatic hypertrophy)   . COPD (chronic obstructive pulmonary disease)     a. on home O2.  . Arthritis   . Ischemic cardiomyopathy     a. 06/2013 Echo: EF 30-35%, no reg wma's, Gr2 DD, triv AI, mod dil LA, nl RV fxn.  . Chronic systolic CHF (congestive heart failure)     a. 06/2013 Echo: EF 30-35%.  . ADENOCARCINOMA, PROSTATE dx'd 2012  . Renal cancer dx'd 1997    lt nephrectomy    Past Surgical History  Procedure Laterality Date  . Coronary artery bypass graft      x 5  . Cardiac catheterization    . Nephrectomy      left nephrectomy for ca  . Posterior cervical laminectomy      x 8   limited ROM  and can't lie flat  . Heart stents      x 5  . Robot assisted laparoscopic radical prostatectomy      for prostate cancer  . Cystoscopy with litholapaxy  05/01/2012    Procedure: CYSTOSCOPY WITH LITHOLAPAXY;  Surgeon: Dutch Gray, MD;  Location: [facility];  Service: Urology;  Laterality: N/A;  . Prostate cancer    . Kidney surgery Left 1994     Allergies  Allergies  Allergen Reactions  . Ativan [Lorazepam] Other (See Comments)   Increases agitation, tolerates xanax  . Lisinopril Cough  . Morphine And Related     Hallucinations, too sedated when given with ativan  . Ibuprofen Hives, Nausea And Vomiting and Rash    Tolerates baby aspirin per patient.      HPI   This 68 year old gentleman has known ischemic heart disease.  He has a history of ischemic cardiomyopathy.  He has a history of CABG.  He has a past history of congestive heart failure.  His last cardiac catheterization was in August 21 2013.  He had the following: 1. Severe three-vessel coronary artery disease  2. 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  3. Normal intracardiac hemodynamics  Recommendations: Continued medical therapy. His coronary anatomy is stable from his prior cardiac catheterization. The patient also has severe peripheral vascular disease.  Vascular access has been very difficult.  On 12/25/13 the patient underwent lower extremity catheterization by Dr. Trula Slade for right leg claudication: The findings were as follows: #1 high-grade, greater than 80% proximal right external iliac artery stenosis, successfully treated using a 6 x 40 chocolate balloon  #  2 right superficial femoral artery occlusion with reconstitution of the above-knee popliteal artery at the level of the patella  #3 two-vessel runoff via the peroneal and posterior tibial artery The patient was admitted last evening because of left-sided chest discomfort.  The pain was worse with taking a deep breath.  A CT angiogram of the chest in the emergency room showed no pulmonary emboli.  The patient has been on a apixaban at home.  He is now able to obtain the drug at a cost which is affordable to him and he has been compliant in taking it.  Today his chest pain is improved.  His main concern is his right leg discomfort.  His initial troponins are normal.  His EKG  shows no acute changes from prior tracings.  He appears to be euvolemic today and does not have any peripheral edema.   Inpatient Medications  . apixaban  5 mg Oral BID  . aspirin EC  325 mg Oral Daily  . budesonide (PULMICORT) nebulizer solution  0.25 mg Nebulization BID  . doxazosin  8 mg Oral QHS  . furosemide  20 mg Intravenous BID  . HYDROmorphone  4 mg Oral TID  . ipratropium-albuterol  3 mL Nebulization QID  . irbesartan  37.5 mg Oral Daily  . isosorbide mononitrate  30 mg Oral Daily  . methylPREDNISolone (SOLU-MEDROL) injection  60 mg Intravenous Q6H  . ranolazine  500 mg Oral BID  . sodium chloride  3 mL Intravenous Q12H  . sodium chloride  3 mL Intravenous Q12H  . sodium chloride  3 mL Intravenous Q12H    Family History Family History  Problem Relation Age of Onset  . Diabetes Mother   . Heart disease Mother   . Hypertension Mother   . Heart attack Mother   . Coronary artery disease    . Heart disease Father   . Diabetes Father   . Heart attack Father   . Heart disease Brother   . Heart attack Brother   . COPD Sister      Social History History   Social History  . Marital Status: Widowed    Spouse Name: N/A    Number of Children: N/A  . Years of Education: N/A   Occupational History  . disabled    Social History Main Topics  . Smoking status: Former Smoker -- 0.30 packs/day for 54 years    Types: Cigarettes    Quit date: 03/29/2013  . Smokeless tobacco: Former Systems developer    Quit date: 04/19/2012     Comment: Using e-cig now  . Alcohol Use: No  . Drug Use: No  . Sexual Activity: Not Currently   Other Topics Concern  . Not on file   Social History Narrative    He lives in [place] by himself.  He is a widower.    He continues to smoke about 4 or 5  cigarettes a day but has a greater than 100 pack-year history having  previously smoked up to 3-4 packs a day over the span about 48 years.     He denies alcohol or drug use.  He is retired secondary to  disability     since the 72s.      Physical Exam  Blood pressure 130/73, pulse 71, temperature 97.5 F (36.4 C), temperature source Oral, resp. rate 22, height 5\' 9"  (1.753 m), weight 222 lb 12.8 oz (101.061 kg), SpO2 90.00%.  General: Pleasant, NAD Psych: Normal affect. Neuro: Alert and oriented  X 3. Moves all extremities spontaneously. HEENT: Normal  Neck: Supple without bruits or JVD. Lungs:  Bilateral wheezing Heart: RRR no s3, s4, or murmurs. Abdomen: Soft, non-tender, non-distended, BS + x 4.  Extremities: No clubbing, cyanosis or edema.  Pedal pulses are equivocal.  Labs   Recent Labs  01/07/14 0005 01/07/14 0635 01/07/14 1345 01/07/14 1952  TROPONINI <0.30 <0.30 <0.30 <0.30   Lab Results  Component Value Date   WBC 15.8* 01/09/2014   HGB 11.8* 01/09/2014   HCT 35.6* 01/09/2014   MCV 92.0 01/09/2014   PLT 148* 01/09/2014     Recent Labs Lab 01/07/14 0635 01/09/14 0508  NA 141 133*  K 4.6 4.5  CL 101 94*  CO2 23 23  BUN 30* 45*  CREATININE 1.34 1.53*  CALCIUM 8.7 8.9  PROT 7.5  --   BILITOT 0.3  --   ALKPHOS 52  --   ALT 14  --   AST 23  --   GLUCOSE 115* 145*   Lab Results  Component Value Date   CHOL 208* 07/01/2013   HDL 34* 07/01/2013   LDLCALC 145* 07/01/2013   TRIG 144 07/01/2013   Lab Results  Component Value Date   DDIMER 0.72* 07/01/2013      ECG  Normal sinus rhythm.  Old anteroseptal myocardial infarction.  No acute changes.  ASSESSMENT AND PLAN 1. ischemic heart disease status post CABG x5.  Most recent cardiac catheterization February 2015.  Vascular status felt to be stable and continued medical therapy advised.  He has difficult vascular access.  At this point, I do not think that he needs another cath .  Would continue with medical therapy.  It does not sound like increasing  Imdur will necessarily help since his presenting CP was not responsive to NTG.   We can try increasing .  Started  Ranexa to see if that will help.  I'm not  sure it's necessary.    Heparin has been DC'd .      2. severe peripheral arterial occlusive disease status post recent right external iliac artery stenosis successfully treated with angioplasty Dr. Trula Slade. 3. COPD 4. ischemic cardiomyopathy with last echo 11/08/13 showing EF of 40-45% with wall motion abnormalities. 5. Chronic pain:  He complains of chronic pain - legs, chest , all over.  He is asking for more Dilaudid.   6. CKD:  His creatinine is elevate.  I have [place] his lasix for now.  I suspect he is intravascularly depleted - even though he has trace leg edema ( which I think is due to COPD)  7,. Hx of Paroxysmal A-fib - there has been some evidence of afib and he was prescribed Eliquis.    He is stable from a cardiac standpoint.  Will sign off.  Call for questions.   Thayer Headings, Brooke Bonito., MD, [facility] 01/09/2014, 8:08 AM [address]  [place] Pager [phone]

## 2014-01-10 LAB — GLUCOSE, CAPILLARY
GLUCOSE-CAPILLARY: 123 mg/dL — AB (ref 70–99)
Glucose-Capillary: 137 mg/dL — ABNORMAL HIGH (ref 70–99)

## 2014-01-14 ENCOUNTER — Telehealth: Payer: Self-pay | Admitting: Internal Medicine

## 2014-01-14 MED ORDER — DOXYCYCLINE HYCLATE 100 MG PO TABS: 100.0000 mg | ORAL_TABLET | Freq: Two times a day (BID) | ORAL | Status: DC

## 2014-01-14 MED ORDER — PREDNISONE 10 MG PO TABS: ORAL_TABLET | ORAL | Status: DC

## 2014-01-14 NOTE — Telephone Encounter (Signed)
Definite aecopd  Tell him not to do stuff like greased lightning  Take doxycycline 100mg  po twice daily x 5 days; take after meals and avoid sunlight  Please RESTART prednisone 40 mg x1 day, then 30 mg x1 day, then 20 mg x1 day, then 10 mg x1 day, and then 5 mg x1 day and stop   REtiurn to see me or NP < 2 weeks

## 2014-01-14 NOTE — Telephone Encounter (Signed)
Called and spoke with pt and he is aware of MR recs.  appt has been scheduled for pt to see TP on 7/21 at 9.

## 2014-01-14 NOTE — Telephone Encounter (Signed)
Last ov w/ MR 6.22.15 Pt just d/c'd from [facility] on 7.1.15 for chest pain on pred taper  Called spoke with patient who reports prod cough with green/yellow/clear mucus, increased SOB, chest tightness and weakness x2 days.  This has worsened today, especially after he was on his "hands and knees cleaning his floors with Greased Lightning."  Pt denies any hemoptysis, f/c/s/n/v, PND, leg swelling.  Pt has 3 days left on his pred taper from the hospital.  Did offer him appt this week ([facility] has openings later this week) but pt unsure about the interim.  Dr Chase Caller please advise, thank you. TP not in the office until 7/8 with no openings

## 2014-01-15 ENCOUNTER — Telehealth: Payer: Self-pay | Admitting: Internal Medicine

## 2014-01-15 NOTE — Telephone Encounter (Signed)
Called spoke with patient who reported that Dr Karlton Lemon has filled this medication for him.  Nothing further needed; will sign off.

## 2014-01-21 ENCOUNTER — Encounter (HOSPITAL_COMMUNITY): Payer: Self-pay | Admitting: Emergency Medicine

## 2014-01-21 ENCOUNTER — Telehealth: Payer: Self-pay | Admitting: Cardiovascular Disease

## 2014-01-21 ENCOUNTER — Emergency Department (HOSPITAL_COMMUNITY)
Admission: EM | Admit: 2014-01-21 | Discharge: 2014-01-21 | Disposition: A | Discharge status: Home / Self Care | Payer: PRIVATE HEALTH INSURANCE | Attending: Emergency Medicine | Admitting: Emergency Medicine

## 2014-01-21 ENCOUNTER — Emergency Department (HOSPITAL_COMMUNITY): Payer: PRIVATE HEALTH INSURANCE

## 2014-01-21 DIAGNOSIS — I10 Essential (primary) hypertension: Secondary | ICD-10-CM | POA: Diagnosis not present

## 2014-01-21 DIAGNOSIS — Z862 Personal history of diseases of the blood and blood-forming organs and certain disorders involving the immune mechanism: Secondary | ICD-10-CM | POA: Diagnosis not present

## 2014-01-21 DIAGNOSIS — Z7982 Long term (current) use of aspirin: Secondary | ICD-10-CM | POA: Diagnosis not present

## 2014-01-21 DIAGNOSIS — Z9889 Other specified postprocedural states: Secondary | ICD-10-CM | POA: Insufficient documentation

## 2014-01-21 DIAGNOSIS — Z87891 Personal history of nicotine dependence: Secondary | ICD-10-CM | POA: Insufficient documentation

## 2014-01-21 DIAGNOSIS — Z85528 Personal history of other malignant neoplasm of kidney: Secondary | ICD-10-CM | POA: Diagnosis not present

## 2014-01-21 DIAGNOSIS — Z87448 Personal history of other diseases of urinary system: Secondary | ICD-10-CM | POA: Insufficient documentation

## 2014-01-21 DIAGNOSIS — J441 Chronic obstructive pulmonary disease with (acute) exacerbation: Secondary | ICD-10-CM | POA: Diagnosis not present

## 2014-01-21 DIAGNOSIS — IMO0002 Reserved for concepts with insufficient information to code with codable children: Secondary | ICD-10-CM | POA: Insufficient documentation

## 2014-01-21 DIAGNOSIS — Z79899 Other long term (current) drug therapy: Secondary | ICD-10-CM | POA: Diagnosis not present

## 2014-01-21 DIAGNOSIS — Z951 Presence of aortocoronary bypass graft: Secondary | ICD-10-CM | POA: Diagnosis not present

## 2014-01-21 DIAGNOSIS — Z8639 Personal history of other endocrine, nutritional and metabolic disease: Secondary | ICD-10-CM | POA: Insufficient documentation

## 2014-01-21 DIAGNOSIS — I2510 Atherosclerotic heart disease of native coronary artery without angina pectoris: Secondary | ICD-10-CM | POA: Diagnosis not present

## 2014-01-21 DIAGNOSIS — I5022 Chronic systolic (congestive) heart failure: Secondary | ICD-10-CM | POA: Diagnosis not present

## 2014-01-21 DIAGNOSIS — M129 Arthropathy, unspecified: Secondary | ICD-10-CM | POA: Insufficient documentation

## 2014-01-21 DIAGNOSIS — Z7901 Long term (current) use of anticoagulants: Secondary | ICD-10-CM | POA: Diagnosis not present

## 2014-01-21 DIAGNOSIS — R0602 Shortness of breath: Secondary | ICD-10-CM | POA: Diagnosis present

## 2014-01-21 DIAGNOSIS — Z8546 Personal history of malignant neoplasm of prostate: Secondary | ICD-10-CM | POA: Insufficient documentation

## 2014-01-21 DIAGNOSIS — F411 Generalized anxiety disorder: Secondary | ICD-10-CM | POA: Diagnosis not present

## 2014-01-21 LAB — CBC
HCT: 38.6 % — ABNORMAL LOW (ref 39.0–52.0)
HEMOGLOBIN: 12.6 g/dL — AB (ref 13.0–17.0)
MCH: 30.1 pg (ref 26.0–34.0)
MCHC: 32.6 g/dL (ref 30.0–36.0)
MCV: 92.1 fL (ref 78.0–100.0)
Platelets: 179 10*3/uL (ref 150–400)
RBC: 4.19 MIL/uL — AB (ref 4.22–5.81)
RDW: 16.5 % — ABNORMAL HIGH (ref 11.5–15.5)
WBC: 8 10*3/uL (ref 4.0–10.5)

## 2014-01-21 LAB — BASIC METABOLIC PANEL
Anion gap: 17 — ABNORMAL HIGH (ref 5–15)
BUN: 29 mg/dL — ABNORMAL HIGH (ref 6–23)
CHLORIDE: 99 meq/L (ref 96–112)
CO2: 22 meq/L (ref 19–32)
Calcium: 8.9 mg/dL (ref 8.4–10.5)
Creatinine, Ser: 1.68 mg/dL — ABNORMAL HIGH (ref 0.50–1.35)
GFR calc Af Amer: 47 mL/min — ABNORMAL LOW (ref >90)
GFR calc non Af Amer: 40 mL/min — ABNORMAL LOW (ref >90)
Glucose, Bld: 105 mg/dL — ABNORMAL HIGH (ref 70–99)
Potassium: 3.4 mEq/L — ABNORMAL LOW (ref 3.7–5.3)
SODIUM: 138 meq/L (ref 137–147)

## 2014-01-21 LAB — TROPONIN I

## 2014-01-21 MED ORDER — SODIUM CHLORIDE 0.9 % IV SOLN
1000.0000 mL | INTRAVENOUS | Status: DC
  Administered 2014-01-21: 1000 mL via INTRAVENOUS

## 2014-01-21 MED ORDER — IPRATROPIUM BROMIDE 0.02 % IN SOLN
0.5000 mg | Freq: Once | RESPIRATORY_TRACT | Status: AC
  Administered 2014-01-21: 0.5 mg via RESPIRATORY_TRACT
  Filled 2014-01-21: qty 2.5

## 2014-01-21 MED ORDER — PREDNISONE 10 MG PO TABS: 60.0000 mg | ORAL_TABLET | Freq: Every day | ORAL | Status: DC

## 2014-01-21 MED ORDER — PREDNISONE 20 MG PO TABS
60.0000 mg | ORAL_TABLET | Freq: Once | ORAL | Status: AC
  Administered 2014-01-21: 60 mg via ORAL
  Filled 2014-01-21: qty 3

## 2014-01-21 MED ORDER — FENTANYL CITRATE 0.05 MG/ML IJ SOLN
50.0000 ug | INTRAMUSCULAR | Status: DC | PRN
  Administered 2014-01-21 (×2): 50 ug via INTRAVENOUS
  Filled 2014-01-21 (×2): qty 2

## 2014-01-21 MED ORDER — HYDROCODONE-ACETAMINOPHEN 5-325 MG PO TABS: 1.0000 | ORAL_TABLET | ORAL | Status: DC | PRN

## 2014-01-21 MED ORDER — ALBUTEROL SULFATE (2.5 MG/3ML) 0.083% IN NEBU
5.0000 mg | INHALATION_SOLUTION | Freq: Once | RESPIRATORY_TRACT | Status: AC
  Administered 2014-01-21: 5 mg via RESPIRATORY_TRACT
  Filled 2014-01-21: qty 6

## 2014-01-21 MED ORDER — SODIUM CHLORIDE 0.9 % IV SOLN
1000.0000 mL | Freq: Once | INTRAVENOUS | Status: AC
  Administered 2014-01-21: 1000 mL via INTRAVENOUS

## 2014-01-21 NOTE — ED Notes (Addendum)
Per EMS,  Pt from home, lives by himself. Pt is CHF clinic patient. Pt sts the nurse called in regards to his breathing. When EMS arrived, pt sts he just can't catch his breath. Pt has had 3 albuterol treatments today without relief. Pt sts he hasn't smoke since November but that he had on cigarette today. Pt has nonproductive cough for EMS, but sts that he has been coughing up green sputum the past three days. Pt c/o chest wall pain from coughing and breathing heavy. 12 lead is unremarkable other than afib which he has hx of.  Pt c/o nausea, given 4 of zofran en route without relief.  EMS gave duoneb and pt initially stated he was feeling better but now sts he doesn't feel any better. Pt has started coughing since the duoneb. Pt seems to be poor historian. Pt had PNA four months ago.

## 2014-01-21 NOTE — ED Notes (Signed)
RN went to flush pt. IV and IV had come most of the way out. Unable to flush IV back in. IV removed.

## 2014-01-21 NOTE — ED Notes (Signed)
BP running 80s and 90s/50s and 60s on the monitor. RN manually took BP at 110/60.

## 2014-01-21 NOTE — ED Notes (Signed)
Bed: YD74 Expected date:  Expected time:  Means of arrival:  Comments: EMS- SOB, nausea

## 2014-01-21 NOTE — ED Notes (Signed)
Pt also c/o R sided jaw pain.

## 2014-01-21 NOTE — ED Notes (Signed)
Pt drinking ginger ale at bedside.

## 2014-01-21 NOTE — Telephone Encounter (Signed)
New Message  Colletta Maryland a nurse with [facility] [place] [facility] called.. Reporting symptoms that the pt is having.Marland Kitchen extreme SOB for the past 3-4 days difficulty sleeping due to SOB. Abdominal bloating.Marland Kitchen

## 2014-01-21 NOTE — ED Notes (Signed)
Patient transported to X-ray 

## 2014-01-21 NOTE — ED Notes (Signed)
Pt requested IV's be removed. MD sts pt. Will be going home.

## 2014-01-21 NOTE — Telephone Encounter (Signed)
Spoke with Colletta Maryland a nurse with [facility] and she had pt on the other line stating that he seemed very sob and unable to complete full sentences. Pts nurse states the pt is very distended in his abdomen.  Pt recently discharged from the hospital.  Had nurse Colletta Maryland to transfer to speak to him.  Noted pt was unable to complete a full sentence d/t sob.  Pt was trying to say he had cp and was very sob.  Unable to obtain full assessment piece d/t the sob.  Informed pt that I will contact 911 and have him transferred to [facility] ED for further eval.  The pt stated he did not want to go to the ED at [facility] he wanted to go to [facility] ED.  Informed pt that when EMS picks him up, they will determine how acute he is, and where to appropriately be transferred. Pt verbalized understanding and agrees with this plan. Will route this message to pts Primary cardiologist and nurse for further review.

## 2014-01-22 ENCOUNTER — Telehealth: Payer: Self-pay | Admitting: Physician Assistant

## 2014-01-22 ENCOUNTER — Telehealth: Payer: Self-pay | Admitting: Cardiovascular Disease

## 2014-01-22 ENCOUNTER — Encounter: Payer: Self-pay | Admitting: Physician Assistant

## 2014-01-22 NOTE — Telephone Encounter (Signed)
No answer./cy

## 2014-01-22 NOTE — Telephone Encounter (Signed)
New message         C/o swelling in legs, sob , cp and rapid weight gain / Colletta Maryland would like for nurse to give pt a call / pt was seen in the ER last night / Colletta Maryland has consulted pt on what to do when he has cp

## 2014-01-22 NOTE — Telephone Encounter (Signed)
    I spoke with Mr. Lucas [name] who called the answering service to report chest pain. He called the office earlier to speak with Dr. Johnsie Cancel concerning recurring chest pain he's been having. He was seen in the ED yesterday for the same complaint and given a breathing treatment and steroid shot with relief. He was sent home feeling better, but the following morning the chest pain returned. His chest pain is currently very mild and "tolerable ".  He also has right lef pain due to PAD followed by Dr. Trula Slade. He is very frustrated. He feels okay for now but knows to call EMS if his symptoms become worse worse more severe.    Perry Mount PA-C  [facility]

## 2014-01-22 NOTE — ED Provider Notes (Signed)
CSN: [id number]     Arrival date & time 01/21/14  1759 History   First MD Initiated Contact with Patient 01/21/14 1803     Chief Complaint  Patient presents with  . Shortness of Breath  . Chest Pain     HPI Patient states more shortness of breath today than usual.  He's been abstinent from tobacco for some time but reports smoking cigarettes a day which point his breathing seemed to worsen.  He tried breathing treatments at home without improvement in his symptoms.  He's currently on 10 mg of prednisone daily.  He was hospitalized approximately 2 weeks ago with a COPD exacerbation.  He states he is having anterior left chest pain that is from coughing.  He states his pain is only present when he coughs.  He denies new lower extremity edema.  Denies orthopnea.  Symptoms are mild in severity.   Past Medical History  Diagnosis Date  . CAD (coronary artery disease)     a. s/p CABG x 5;  b. 04/2012 Cath: patent LIMA->LAD and VG->OM3, all other grafts occluded.  . Hypertension   . Anxiety   . History of kidney cancer   . Adjustment disorder with anxiety   . Hyperlipidemia   . BPH (benign prostatic hypertrophy)   . COPD (chronic obstructive pulmonary disease)     a. on home O2.  . Arthritis   . Ischemic cardiomyopathy     a. 06/2013 Echo: EF 30-35%, no reg wma's, Gr2 DD, triv AI, mod dil LA, nl RV fxn.  . Chronic systolic CHF (congestive heart failure)     a. 06/2013 Echo: EF 30-35%.  . ADENOCARCINOMA, PROSTATE dx'd 2012  . Renal cancer dx'd 1997    lt nephrectomy   Past Surgical History  Procedure Laterality Date  . Coronary artery bypass graft      x 5  . Cardiac catheterization    . Nephrectomy      left nephrectomy for ca  . Posterior cervical laminectomy      x 8   limited ROM  and can't lie flat  . Heart stents      x 5  . Robot assisted laparoscopic radical prostatectomy      for prostate cancer  . Cystoscopy with litholapaxy  05/01/2012    Procedure: CYSTOSCOPY WITH  LITHOLAPAXY;  Surgeon: Dutch Gray, MD;  Location: [facility];  Service: Urology;  Laterality: N/A;  . Prostate cancer    . Kidney surgery Left 1994   Family History  Problem Relation Age of Onset  . Diabetes Mother   . Heart disease Mother   . Hypertension Mother   . Heart attack Mother   . Coronary artery disease    . Heart disease Father   . Diabetes Father   . Heart attack Father   . Heart disease Brother   . Heart attack Brother   . COPD Sister    History  Substance Use Topics  . Smoking status: Former Smoker -- 0.30 packs/day for 54 years    Types: Cigarettes    Quit date: 03/29/2013  . Smokeless tobacco: Former Systems developer    Quit date: 04/19/2012     Comment: Using e-cig now  . Alcohol Use: No    Review of Systems  All other systems reviewed and are negative.     Allergies  Ativan; Lisinopril; Morphine and related; and Ibuprofen  Home Medications   Prior to Admission medications   Medication Sig Start Date End  Date Taking? Authorizing Provider  albuterol (PROVENTIL HFA;VENTOLIN HFA) 108 (90 BASE) MCG/ACT inhaler Inhale 2 puffs into the lungs every 6 (six) hours as needed for wheezing or shortness of breath. Shortness of breath 11/02/13  Yes Domenic Polite, MD  albuterol (PROVENTIL) (2.5 MG/3ML) 0.083% nebulizer solution Take 2.5 mg by nebulization 4 (four) times daily. 04/06/13  Yes Erick Colace, NP  ALPRAZolam Duanne Moron) 1 MG tablet Take 1 tablet (1 mg total) by mouth 3 (three) times daily as needed for anxiety. 11/09/13  Yes Theodis Blaze, MD  apixaban (ELIQUIS) 5 MG TABS tablet Take 1 tablet (5 mg total) by mouth 2 (two) times daily. 11/09/13  Yes Theodis Blaze, MD  aspirin EC 81 MG tablet Take 81 mg by mouth daily.   Yes Historical Provider, MD  doxazosin (CARDURA) 8 MG tablet Take 8 mg by mouth at bedtime.    Yes Historical Provider, MD  furosemide (LASIX) 40 MG tablet Take 0.5 tablets (20 mg total) by mouth 2 (two) times daily. Resume in 2 days. 01/11/14  Yes Eugenie Filler, MD  HYDROcodone-acetaminophen (NORCO) 10-325 MG per tablet Take 1 tablet by mouth every 4 (four) hours as needed for moderate pain.   Yes Historical Provider, MD  HYDROmorphone (DILAUDID) 4 MG tablet Take 4 mg by mouth 3 (three) times daily.  11/09/13  Yes Theodis Blaze, MD  ipratropium (ATROVENT) 0.02 % nebulizer solution Take 0.5 mg by nebulization 4 (four) times daily.   Yes Historical Provider, MD  Ipratropium-Albuterol (COMBIVENT RESPIMAT) 20-100 MCG/ACT AERS respimat Inhale 1 puff into the lungs every 6 (six) hours as needed for wheezing or shortness of breath. 11/02/13  Yes Domenic Polite, MD  mometasone-formoterol (DULERA) 100-5 MCG/ACT AERO Inhale 2 puffs into the lungs 2 (two) times daily. 11/02/13  Yes Domenic Polite, MD  NITROSTAT 0.3 MG SL tablet Place 0.3 mg under the tongue every 5 (five) minutes as needed for chest pain.  06/15/13  Yes Historical Provider, MD  predniSONE (DELTASONE) 20 MG tablet Take 3 tablets (60 mg total) by mouth daily with breakfast. Take 3 tablets ( 60mg )  2 times daily x 2 days, then 3 tablets (60mg ) daily x 2 days, then 2 tablets (40mg ) daily x 2 days, then 1 tablet (20mg ) daily x 2 days then stop. 01/09/14  Yes Eugenie Filler, MD  valsartan (DIOVAN) 40 MG tablet Take 40 mg by mouth daily.  11/09/13  Yes Historical Provider, MD  HYDROcodone-acetaminophen (NORCO/VICODIN) 5-325 MG per tablet Take 1 tablet by mouth every 4 (four) hours as needed for moderate pain. 01/21/14   Hoy Morn, MD  NON FORMULARY Place 2 L into the nose at bedtime. O2 at night only    Historical Provider, MD  predniSONE (DELTASONE) 10 MG tablet Take 6 tablets (60 mg total) by mouth daily. 01/21/14   Hoy Morn, MD   BP 140/67  Pulse 81  Temp(Src) 98.9 F (37.2 C) (Oral)  Resp 19  SpO2 93% Physical Exam  Nursing note and vitals reviewed. Constitutional: He is oriented to person, place, and time. He appears well-developed and well-nourished.  HENT:  Head: Normocephalic and  atraumatic.  Eyes: EOM are normal.  Neck: Normal range of motion.  Cardiovascular: Normal rate, regular rhythm, normal heart sounds and intact distal pulses.   Pulmonary/Chest: Effort normal. No respiratory distress. He has wheezes.  Abdominal: Soft. He exhibits no distension. There is no tenderness.  Musculoskeletal: Normal range of motion.  Neurological: He is  alert and oriented to person, place, and time.  Skin: Skin is warm and dry.  Psychiatric: He has a normal mood and affect. Judgment normal.    ED Course  Procedures (including critical care time) Labs Review Labs Reviewed  CBC - Abnormal; Notable for the following:    RBC 4.19 (*)    Hemoglobin 12.6 (*)    HCT 38.6 (*)    RDW 16.5 (*)    All other components within normal limits  BASIC METABOLIC PANEL - Abnormal; Notable for the following:    Potassium 3.4 (*)    Glucose, Bld 105 (*)    BUN 29 (*)    Creatinine, Ser 1.68 (*)    GFR calc non Af Amer 40 (*)    GFR calc Af Amer 47 (*)    Anion gap 17 (*)    All other components within normal limits  TROPONIN I    Imaging Review Dg Chest 2 View  01/21/2014   CLINICAL DATA:  Shortness of breath and chest pain  EXAM: CHEST  2 VIEW  COMPARISON:  Chest radiograph and chest CT January 07, 2014  FINDINGS: There is underlying emphysematous change. There is no appreciable edema or consolidation. The previously noted interstitial prominence is stable. There is no new opacity. No airspace consolidation. No adenopathy. Heart is upper normal in size with pulmonary vascularity within normal limits. Patient is status post coronary artery bypass grafting. There is degenerative change in the thoracic spine. There is evidence of old trauma involving the lateral left clavicle.  IMPRESSION: Underlying emphysema with diffuse interstitial prominence which is stable. There is no frank edema or consolidation. The small nodular opacities seen on the recent chest CT are not well delineated on  radiographic examination. There is no airspace consolidation.   Electronically Signed   By: Lowella Grip M.D.   On: 01/21/2014 18:52     EKG Interpretation   Date/Time:  Monday January 21 2014 18:11:40 EDT Ventricular Rate:  84 PR Interval:  152 QRS Duration: 99 QT Interval:  392 QTC Calculation: 463 R Axis:   65 Text Interpretation:  Sinus rhythm Supraventricular bigeminy Anteroseptal  infarct, old Minimal ST depression, lateral leads No significant change  was found Confirmed by Red Mandt  MD, Lennette Bihari ([id number]) on 01/21/2014 6:31:59 PM      MDM   Final diagnoses:  COPD exacerbation    Patient feels much better after breathing treatment and 60 mg of prednisone.  Patient be discharged home on a steroid burst.  Pulmonary followup.  Doubt ACS.  Doubt congestive heart failure.  Doubt PE.  Patient understands return to the ER for new or worsening symptoms    Hoy Morn, MD 01/22/14 0010

## 2014-01-24 ENCOUNTER — Telehealth: Payer: Self-pay

## 2014-01-24 DIAGNOSIS — Z48812 Encounter for surgical aftercare following surgery on the circulatory system: Secondary | ICD-10-CM

## 2014-01-24 DIAGNOSIS — I70219 Atherosclerosis of native arteries of extremities with intermittent claudication, unspecified extremity: Secondary | ICD-10-CM

## 2014-01-24 NOTE — Telephone Encounter (Signed)
Spoke with patient to schedule an appointment for  02/04/14, dpm

## 2014-01-24 NOTE — Telephone Encounter (Signed)
CALLED  PT   PER PT   FEELS  GOOD   THIS  IS  "SECOND GOOD  DAY " WEIGHT   IS  211 DOWN  FROM  216     WILL  CALL IF HAS   ANY WORSENING   SYMPTOMS./CY

## 2014-01-24 NOTE — Telephone Encounter (Signed)
Phone call from Lucas [name].  Reported he has right leg weakness and pain in the right calf and top of right thigh, after walking approx. 1.5 minutes.  Stated the symptoms are the same now, as they were before the procedure.  Stated if he stops and rests the pain eases up.  Denies rest pain.  Denies open sores.  Stated he wants to see Dr. Trula Slade and discuss bypass surgery, because he doesn't want to live with this pain.  Advised will schedule appt. with Dr. Trula Slade, after he returns from vacation.  Encouraged to call office sooner, with onset of rest pain, or if open sores develop, on lower extremities.  Verb. Understanding.

## 2014-01-29 ENCOUNTER — Ambulatory Visit (INDEPENDENT_AMBULATORY_CARE_PROVIDER_SITE_OTHER): Payer: PRIVATE HEALTH INSURANCE | Admitting: Adult Health

## 2014-01-29 ENCOUNTER — Other Ambulatory Visit (INDEPENDENT_AMBULATORY_CARE_PROVIDER_SITE_OTHER): Payer: PRIVATE HEALTH INSURANCE

## 2014-01-29 ENCOUNTER — Encounter: Payer: Self-pay | Admitting: Adult Health

## 2014-01-29 ENCOUNTER — Telehealth: Payer: Self-pay | Admitting: Adult Health

## 2014-01-29 VITALS — BP 112/70 | HR 69 | Temp 98.4°F | Ht 68.0 in | Wt 212.0 lb

## 2014-01-29 DIAGNOSIS — R0609 Other forms of dyspnea: Secondary | ICD-10-CM

## 2014-01-29 DIAGNOSIS — R918 Other nonspecific abnormal finding of lung field: Secondary | ICD-10-CM

## 2014-01-29 DIAGNOSIS — R0989 Other specified symptoms and signs involving the circulatory and respiratory systems: Secondary | ICD-10-CM

## 2014-01-29 DIAGNOSIS — J449 Chronic obstructive pulmonary disease, unspecified: Secondary | ICD-10-CM

## 2014-01-29 DIAGNOSIS — J441 Chronic obstructive pulmonary disease with (acute) exacerbation: Secondary | ICD-10-CM | POA: Diagnosis not present

## 2014-01-29 LAB — BASIC METABOLIC PANEL
BUN: 28 mg/dL — ABNORMAL HIGH (ref 6–23)
CHLORIDE: 103 meq/L (ref 96–112)
CO2: 30 mEq/L (ref 19–32)
CREATININE: 1.3 mg/dL (ref 0.4–1.5)
Calcium: 9.7 mg/dL (ref 8.4–10.5)
GFR: 59.82 mL/min — ABNORMAL LOW (ref >60.00)
Glucose, Bld: 104 mg/dL — ABNORMAL HIGH (ref 70–99)
POTASSIUM: 3.8 meq/L (ref 3.5–5.1)
Sodium: 140 mEq/L (ref 135–145)

## 2014-01-29 LAB — BRAIN NATRIURETIC PEPTIDE: PRO B NATRI PEPTIDE: 203 pg/mL — AB (ref 0.0–100.0)

## 2014-01-29 MED ORDER — CLOTRIMAZOLE 10 MG MT TROC: 10.0000 mg | Freq: Every day | OROMUCOSAL | Status: DC

## 2014-01-29 MED ORDER — PREDNISONE 10 MG PO TABS: ORAL_TABLET | ORAL | Status: DC

## 2014-01-29 NOTE — Assessment & Plan Note (Signed)
CT chest 01/2014 >Negative for pulmonary embolism to the proximal segmental level. Negative for aortic dissection. . COPD.   Small pulmonary nodules, measuring up to 6 mm in the right middle  and lower lobes. See recommendations below. >>repeat CT in 6 months

## 2014-01-29 NOTE — Patient Instructions (Addendum)
Mycelex troches 1 troches five times daily  For 1 week.  Prednisone taper to off as directed.  Use Claritin 10mg  daily As needed  Drainage.  Mucinex DM Twice daily   As needed  Cough/congestion  Continue on Dulera 2 puffs Twice daily  -rinse after use.  Use Combivent 1 puff Four times a day  .  Use Duoneb and ProAir as needed -this is your rescue medicine.  Labs today .  Follow up with family doctor for your blood pressure and leg cramps.  follow up Dr. Chase Caller in 4 weeks and As needed   Please contact office for sooner follow up if symptoms do not improve or worsen or seek emergency care

## 2014-01-29 NOTE — Telephone Encounter (Signed)
Notes Recorded by Melvenia Needles, NP on 01/29/2014 at 12:43 PM Kidney fxn is better K+ is fine  CHF marker is better Cont w/ov rec  follow up pcp as discussed  Please contact office for sooner follow up if symptoms do not improve or worsen or seek emergency care  ---  Called spoke with pt. Made him aware of above. He voiced his understanding and needed nothing further

## 2014-01-29 NOTE — Progress Notes (Signed)
Subjective:    Patient ID: Lucas [name], male    DOB: 09-Oct-1945, 68 y.o.   MRN: [id number]  HPI #Smoking  - quit sept 2014 but having withdrawals  #Moderate COPD Spirometry 04/18/12       06/02/12 FEV1 1.64L 46%             1.94  55%   FVC 2.78L 60%               3.01  65% FEV1/FVC 59                   64   #CHF/CAD - s/p CABG. C - Oct 6195 Systolic CHF admission ef 30% - Sept 0932: Diastolic CHF admission  - DEc 2013: EF 30% Admission for Unstable angiona but welll compensated CHF  #Obesity  - Body mass index is 33.15 kg/(m^2). on 08/31/2013 - Have been ruled out January 2015. Advised one liter of oxygen nocturnally    #AECOPD  - 03/26/13 - office Rx - 04/03/13 - AECOPD with Acute Diast CHF admission though 04/06/13 - Jan 2014 with doxy and pred in office with extended pred a week later  #imaging  - 04/03/13 CXR - CHF changes. NEver had CT -   OV 08/31/2013 Chief Complaint  Patient presents with  . COPD    follow-up. Pt states he has been having increased productive cough with yellow phlegm, increased SOB, chest congestion,  whezing and chest tightness x 3 days.   Followup COPD - Gold stage 2, DLCO 46% -  spet 2014,Full PFT   - At last visit mid January 2015 he had a COPD exacerbation with treated with doxycycline and prednisone. He improved after this. In the interim he had a cardiac cath this is listed below. However for the last 3 days is having increased cough, change in color of sputum, increasing wheeze and increased sputum volume. COPD cat score is in the 62s and reflects COPD exacerbation. He takes Combivent respimat which helps him but he does not take ANoro or breo because this does not help him. He is open to taking her steroids  New issue: He is open to having low-dose CT scan of the chest for lung cancer screening because Medicare is paying for it. However, our system not yet set up for this    Past, Family, Social reviewed: Since  last visit he has had  a cardiac catheterization that apparently was clean however, he does have right lower the claudication and apparently he has stenosis. He has a followup with cardiology pending. He is concerned that he might not be a revascularization candidate because of his COPD. In addition Dr. Elsworth Soho for sleep evaluation he tells me that he does not have sleep apnea; confirmed on electronic medical records dated 07/24/2013. His been advised one liters oxygen for sleep    #COPD exacerbation  - you are in flare up of copd again - Take prednisone 40 mg daily x 2 days, then 20mg  daily x 2 days, then 10mg  daily x 2 days, then 5mg  daily x 2 days and stop  take levaquin 500mg  once daily  X 6 days   #COPD Continue combivent respimat 4 times daily Start QVAR 60mcg, 2 puff twice daily Use albuterol 2 puff as needed No need for ANORO or BREO because this is not helping you Continue oxygen 18h/day  #Lung cancer screen  - do LDCT chest at next visit when we have systems in place  #preop  evaluation for vascular surgery  - I think you might be handle surgery for leg but at some risk; we will have to reassess this formally later  #FOllowuo Return to see my NP Areal Cochrane for med calendar - next several days to few weeks REturn to see me in 3 months  - spirometry at followujp      11/30/2013 Follow up  Returns for  3 month COPD follow up .  Reports is doing well.  Complains of hoarseness/sore throat .  Has been hospitalized 3x since last ov for CHF/SOB Most recent admit 5/6 for decompensated CHF w/ acute resp fail w/ hypoxia .  He was diuresis with decreased wt and swelling.  Followed closely by cards.  Says swelling is doing much better.   Currently on Dulera Twice daily  And Combivent Four times a day  And uses Duoneb As needed .  Remains on O2 at 2 l/m At bedtime  And with activity As needed   Denies any hemoptysis, fever, or orthopnea, PND, or increased leg swelling   OV 12/31/2013  Chief Complaint    Patient presents with  . Follow-up    Pt c/o dyspnea with exeriton and little ambulation. Pt states he had a blockage in his right calf and had a "balloon placed" last week. Pt c/o dry persistant cough and left chest pain with actvity.     Followup Gold stage II COPD and multiple medical problems  - COPD: Currently disease is stable. His inhaler regimen is weird but he wont change it. HE is on dulera and  combivent scheduled. Also on oi2 18h/day. Has class II dyspnea on exertion. Minimal cough only. His dyspnea is out of proportion to severity of copd and is associated with claudication but no chest pain  - Smoking: He thinks he quit smoking but he is actually smoking electronic cigarettes. I cautioned him that this is bad  - Lung cancer screening: Due to cost and insurance issues he has not had CT scan  - . CAT COPD Symptom & Quality of Life Score ([employer] trademark) 0 is no burden. 5 is highest burden 03/26/2013  04/23/2013  07/17/2013  08/31/2013 aecopd  Never Cough -> Cough all the time 4 2 3 5   No phlegm in chest -> Chest is full of phlegm 4 2 3 4   No chest tightness -> Chest feels very tight 3 2 3 4   No dyspnea for 1 flight stairs/hill -> Very dyspneic for 1 flight of stairs 4 4 4 4   No limitations for ADL at home -> Very limited with ADL at home 4 2 3 4   Confident leaving home -> Not at all confident leaving home 2 2 2 4   Sleep soundly -> Do not sleep soundly because of lung condition 4 3 3 4   Lots of Energy -> No energy at all 4 4 4 5   TOTAL Score (max 40)  29 21 25  34   Past medical history reviewed: He continues to have claudication despite balloon placement in his femoral artery according to his history. Dyspnea and claudication occurs at the same time. He also has chronic pain but does not have a pain clinic. Apparently in the hospital Dilaudid helped him and he wants this   01/29/2014 Acute OV  /Post hosp/ER  follow up  COPD , CHF Atrial Fib  Patient presents for a post  hospital followup. He was admitted June 28 through July 1 for COPD, exacerbation, and decompensated, acute on chronic congestive  heart failure. Patient was treated with IV antibiotics, steroids, and nebulized bronchodilators. Patient did have atypical chest pain. He underwent a CT chest angiogram that was negative for PE.  There were very small pulmonary nodules measuring up to 6 mm in the right middle and lower lobes. Patient was seen by cardiology and ruled out for acute MI and treated with medical management. Patient was treated with diuresis. Since discharge. Patient reports that he has continued to have cough, shortness, of breath and wheezing. He was called in doxycycline on July 6, along with a prednisone taper.  Patient was seen in the emergency room on July 14 and given prednisone taper. Patient says that he continues to have muscle cramping. He is currently on Lasix 40 mg daily He denies any hemoptysis, fever, chest pain, vomiting, diarrhea, bloody stools. He does complain of hoarseness.     Review of Systems  Constitutional: Negative for fever and unexpected weight change.  HENT: Negative for congestion, dental problem, ear pain, nosebleeds, postnasal drip, rhinorrhea, sinus pressure, sneezing, sore throat and trouble swallowing.   Eyes: Negative for redness and itching.  Respiratory: Positive for cough and shortness of breath. Negative for chest tightness and wheezing.   Cardiovascular:  . Negative for palpitations and leg swelling.  Gastrointestinal: Negative for nausea and vomiting.  Genitourinary: Negative for dysuria.  Musculoskeletal: Negative for joint swelling.  Skin: Negative for rash.  Neurological: Negative for headaches.  Hematological: Does not bruise/bleed easily.  Psychiatric/Behavioral: Negative for dysphoric mood. The patient is not nervous/anxious.        Objective:   Physical Exam   HENT:  Head: Normocephalic and atraumatic.  Right Ear: External ear  normal.  Left Ear: External ear normal.  Mouth/Throat: Oropharynx is clear and moist. Post pharynx w. Few white patches noted.  mallampatti class 3-4,Eyes: Conjunctivae and EOM are normal. Pupils are equal, round, and reactive to light. Right eye exhibits no discharge. Left eye exhibits no discharge. No scleral icterus.  Neck: Normal range of motion. Neck supple. No JVD present. No tracheal deviation present. No thyromegaly present.  Cardiovascular: Normal rate, regular rhythm and intact distal pulses.  Exam reveals no gallop and no friction rub.   No murmur heard. Pulmonary/Chest: Effort normal. No respiratory distress. He has NO WHEEZE THIS VISIT He has no rales. He exhibits no tenderness.  Abdominal: Soft. Bowel sounds are normal. He exhibits no distension and no mass. There is no tenderness. There is no rebound and no guarding.  Musculoskeletal: Normal range of motion. He exhibits no edema and no tenderness.  Lymphadenopathy:    He has no cervical adenopathy.  Neurological: He is alert and oriented to person, place, and time. He has normal reflexes. No cranial nerve deficit. Coordination normal.           Assessment & Plan:

## 2014-01-29 NOTE — Addendum Note (Signed)
Addended by: Maurice March on: 01/29/2014 11:05 AM   Modules accepted: Orders

## 2014-01-29 NOTE — Assessment & Plan Note (Addendum)
Slow to resolve flare in pt w/ ongoing smoking  Advised on side effects of persistent steroid use Advised will try to taper offfl  Check labs w/ bnp and bmet  Advised on smoking cessation   Plan  mycelex troches 1 troches five times daily  For 1 week.  Prednisone taper to off as directed.  Use Claritin 10mg  daily As needed  Drainage.  Mucinex DM Twice daily   As needed  Cough/congestion  Continue on Dulera 2 puffs Twice daily  -rinse after use.  Use Combivent 1 puff Four times a day  .  Use Duoneb and ProAir as needed -this is your rescue medicine.  Labs today .  Follow up with family doctor for your blood pressure and leg cramps.  follow up Dr. Chase Caller in 4 weeks and As needed   Please contact office for sooner follow up if symptoms do not improve or worsen or seek emergency care

## 2014-01-31 ENCOUNTER — Encounter (HOSPITAL_COMMUNITY): Payer: Self-pay | Admitting: Emergency Medicine

## 2014-01-31 ENCOUNTER — Telehealth: Payer: Self-pay | Admitting: Cardiovascular Disease

## 2014-01-31 ENCOUNTER — Emergency Department (HOSPITAL_COMMUNITY): Payer: PRIVATE HEALTH INSURANCE

## 2014-01-31 ENCOUNTER — Inpatient Hospital Stay (HOSPITAL_COMMUNITY)
Admission: EM | Admit: 2014-01-31 | Discharge: 2014-02-05 | DRG: 291 | Disposition: A | Discharge status: Home / Self Care | Payer: PRIVATE HEALTH INSURANCE | Attending: Internal Medicine | Admitting: Internal Medicine

## 2014-01-31 DIAGNOSIS — J440 Chronic obstructive pulmonary disease with acute lower respiratory infection: Secondary | ICD-10-CM | POA: Diagnosis present

## 2014-01-31 DIAGNOSIS — Z905 Acquired absence of kidney: Secondary | ICD-10-CM

## 2014-01-31 DIAGNOSIS — N400 Enlarged prostate without lower urinary tract symptoms: Secondary | ICD-10-CM | POA: Diagnosis present

## 2014-01-31 DIAGNOSIS — N183 Chronic kidney disease, stage 3 unspecified: Secondary | ICD-10-CM

## 2014-01-31 DIAGNOSIS — I4891 Unspecified atrial fibrillation: Secondary | ICD-10-CM | POA: Diagnosis present

## 2014-01-31 DIAGNOSIS — Z9861 Coronary angioplasty status: Secondary | ICD-10-CM

## 2014-01-31 DIAGNOSIS — I2510 Atherosclerotic heart disease of native coronary artery without angina pectoris: Secondary | ICD-10-CM

## 2014-01-31 DIAGNOSIS — I5022 Chronic systolic (congestive) heart failure: Secondary | ICD-10-CM | POA: Diagnosis present

## 2014-01-31 DIAGNOSIS — I70219 Atherosclerosis of native arteries of extremities with intermittent claudication, unspecified extremity: Secondary | ICD-10-CM

## 2014-01-31 DIAGNOSIS — I5023 Acute on chronic systolic (congestive) heart failure: Secondary | ICD-10-CM | POA: Diagnosis not present

## 2014-01-31 DIAGNOSIS — Z79899 Other long term (current) drug therapy: Secondary | ICD-10-CM

## 2014-01-31 DIAGNOSIS — Z9981 Dependence on supplemental oxygen: Secondary | ICD-10-CM

## 2014-01-31 DIAGNOSIS — M129 Arthropathy, unspecified: Secondary | ICD-10-CM | POA: Diagnosis present

## 2014-01-31 DIAGNOSIS — Z66 Do not resuscitate: Secondary | ICD-10-CM | POA: Diagnosis present

## 2014-01-31 DIAGNOSIS — J9620 Acute and chronic respiratory failure, unspecified whether with hypoxia or hypercapnia: Secondary | ICD-10-CM | POA: Diagnosis present

## 2014-01-31 DIAGNOSIS — Z951 Presence of aortocoronary bypass graft: Secondary | ICD-10-CM

## 2014-01-31 DIAGNOSIS — Z7982 Long term (current) use of aspirin: Secondary | ICD-10-CM

## 2014-01-31 DIAGNOSIS — I509 Heart failure, unspecified: Secondary | ICD-10-CM | POA: Diagnosis present

## 2014-01-31 DIAGNOSIS — J441 Chronic obstructive pulmonary disease with (acute) exacerbation: Secondary | ICD-10-CM

## 2014-01-31 DIAGNOSIS — I739 Peripheral vascular disease, unspecified: Secondary | ICD-10-CM

## 2014-01-31 DIAGNOSIS — G8929 Other chronic pain: Secondary | ICD-10-CM

## 2014-01-31 DIAGNOSIS — I255 Ischemic cardiomyopathy: Secondary | ICD-10-CM

## 2014-01-31 DIAGNOSIS — E785 Hyperlipidemia, unspecified: Secondary | ICD-10-CM | POA: Diagnosis present

## 2014-01-31 DIAGNOSIS — R7303 Prediabetes: Secondary | ICD-10-CM

## 2014-01-31 DIAGNOSIS — J449 Chronic obstructive pulmonary disease, unspecified: Secondary | ICD-10-CM

## 2014-01-31 DIAGNOSIS — R0609 Other forms of dyspnea: Secondary | ICD-10-CM | POA: Diagnosis not present

## 2014-01-31 DIAGNOSIS — R918 Other nonspecific abnormal finding of lung field: Secondary | ICD-10-CM

## 2014-01-31 DIAGNOSIS — G894 Chronic pain syndrome: Secondary | ICD-10-CM | POA: Diagnosis present

## 2014-01-31 DIAGNOSIS — R079 Chest pain, unspecified: Secondary | ICD-10-CM

## 2014-01-31 DIAGNOSIS — R0600 Dyspnea, unspecified: Secondary | ICD-10-CM

## 2014-01-31 DIAGNOSIS — I480 Paroxysmal atrial fibrillation: Secondary | ICD-10-CM

## 2014-01-31 DIAGNOSIS — Z85528 Personal history of other malignant neoplasm of kidney: Secondary | ICD-10-CM | POA: Diagnosis present

## 2014-01-31 DIAGNOSIS — F411 Generalized anxiety disorder: Secondary | ICD-10-CM | POA: Diagnosis present

## 2014-01-31 DIAGNOSIS — J209 Acute bronchitis, unspecified: Secondary | ICD-10-CM | POA: Diagnosis present

## 2014-01-31 DIAGNOSIS — Z833 Family history of diabetes mellitus: Secondary | ICD-10-CM

## 2014-01-31 DIAGNOSIS — I129 Hypertensive chronic kidney disease with stage 1 through stage 4 chronic kidney disease, or unspecified chronic kidney disease: Secondary | ICD-10-CM | POA: Diagnosis present

## 2014-01-31 DIAGNOSIS — F17200 Nicotine dependence, unspecified, uncomplicated: Secondary | ICD-10-CM | POA: Diagnosis present

## 2014-01-31 DIAGNOSIS — Z8546 Personal history of malignant neoplasm of prostate: Secondary | ICD-10-CM

## 2014-01-31 DIAGNOSIS — Z8249 Family history of ischemic heart disease and other diseases of the circulatory system: Secondary | ICD-10-CM

## 2014-01-31 DIAGNOSIS — I2589 Other forms of chronic ischemic heart disease: Secondary | ICD-10-CM | POA: Diagnosis present

## 2014-01-31 HISTORY — DX: Peripheral vascular disease, unspecified: I73.9

## 2014-01-31 HISTORY — DX: Chronic kidney disease, stage 3 unspecified: N18.30

## 2014-01-31 HISTORY — DX: Other nonspecific abnormal finding of lung field: R91.8

## 2014-01-31 HISTORY — DX: Other chronic pain: G89.29

## 2014-01-31 HISTORY — DX: Chronic kidney disease, stage 3 (moderate): N18.3

## 2014-01-31 HISTORY — DX: Hypotension, unspecified: I95.9

## 2014-01-31 HISTORY — DX: Malignant neoplasm of prostate: C61

## 2014-01-31 HISTORY — DX: Tobacco use: Z72.0

## 2014-01-31 HISTORY — DX: Syncope and collapse: R55

## 2014-01-31 HISTORY — DX: Paroxysmal atrial fibrillation: I48.0

## 2014-01-31 LAB — BASIC METABOLIC PANEL
ANION GAP: 16 — AB (ref 5–15)
BUN: 29 mg/dL — ABNORMAL HIGH (ref 6–23)
CHLORIDE: 101 meq/L (ref 96–112)
CO2: 24 mEq/L (ref 19–32)
Calcium: 10 mg/dL (ref 8.4–10.5)
Creatinine, Ser: 1.33 mg/dL (ref 0.50–1.35)
GFR calc Af Amer: 62 mL/min — ABNORMAL LOW (ref >90)
GFR calc non Af Amer: 53 mL/min — ABNORMAL LOW (ref >90)
Glucose, Bld: 128 mg/dL — ABNORMAL HIGH (ref 70–99)
POTASSIUM: 4.1 meq/L (ref 3.7–5.3)
Sodium: 141 mEq/L (ref 137–147)

## 2014-01-31 LAB — CBC
HCT: 39.3 % (ref 39.0–52.0)
Hemoglobin: 13 g/dL (ref 13.0–17.0)
MCH: 30.6 pg (ref 26.0–34.0)
MCHC: 33.1 g/dL (ref 30.0–36.0)
MCV: 92.5 fL (ref 78.0–100.0)
PLATELETS: 211 10*3/uL (ref 150–400)
RBC: 4.25 MIL/uL (ref 4.22–5.81)
RDW: 15.9 % — AB (ref 11.5–15.5)
WBC: 7.9 10*3/uL (ref 4.0–10.5)

## 2014-01-31 LAB — PRO B NATRIURETIC PEPTIDE: Pro B Natriuretic peptide (BNP): 1616 pg/mL — ABNORMAL HIGH (ref 0–125)

## 2014-01-31 LAB — TROPONIN I: Troponin I: <0.3 ng/mL (ref <0.30)

## 2014-01-31 MED ORDER — HYDROMORPHONE HCL PF 1 MG/ML IJ SOLN
1.0000 mg | Freq: Once | INTRAMUSCULAR | Status: AC
  Administered 2014-01-31: 1 mg via INTRAVENOUS
  Filled 2014-01-31: qty 1

## 2014-01-31 MED ORDER — DEXTROSE 5 % IV SOLN
500.0000 mg | Freq: Every day | INTRAVENOUS | Status: DC
  Administered 2014-01-31 – 2014-02-03 (×4): 500 mg via INTRAVENOUS
  Filled 2014-01-31 (×4): qty 500

## 2014-01-31 MED ORDER — METHYLPREDNISOLONE SODIUM SUCC 125 MG IJ SOLR
125.0000 mg | Freq: Once | INTRAMUSCULAR | Status: AC
  Administered 2014-01-31: 125 mg via INTRAVENOUS
  Filled 2014-01-31: qty 2

## 2014-01-31 MED ORDER — FUROSEMIDE 10 MG/ML IJ SOLN
40.0000 mg | Freq: Once | INTRAMUSCULAR | Status: AC
Start: 2014-01-31 — End: 2014-01-31
  Administered 2014-01-31: 40 mg via INTRAVENOUS
  Filled 2014-01-31: qty 4

## 2014-01-31 MED ORDER — ONDANSETRON HCL 4 MG/2ML IJ SOLN
4.0000 mg | Freq: Four times a day (QID) | INTRAMUSCULAR | Status: DC | PRN
  Administered 2014-02-01: 4 mg via INTRAVENOUS
  Filled 2014-01-31: qty 2

## 2014-01-31 MED ORDER — APIXABAN 5 MG PO TABS
5.0000 mg | ORAL_TABLET | Freq: Two times a day (BID) | ORAL | Status: DC
  Administered 2014-01-31 – 2014-02-05 (×10): 5 mg via ORAL
  Filled 2014-01-31 (×11): qty 1

## 2014-01-31 MED ORDER — HYDROMORPHONE HCL 4 MG PO TABS
4.0000 mg | ORAL_TABLET | Freq: Every day | ORAL | Status: DC
  Administered 2014-01-31 – 2014-02-04 (×5): 4 mg via ORAL
  Filled 2014-01-31 (×5): qty 1

## 2014-01-31 MED ORDER — ALBUTEROL SULFATE (2.5 MG/3ML) 0.083% IN NEBU
5.0000 mg | INHALATION_SOLUTION | Freq: Once | RESPIRATORY_TRACT | Status: AC
  Administered 2014-01-31: 5 mg via RESPIRATORY_TRACT
  Filled 2014-01-31: qty 6

## 2014-01-31 MED ORDER — CLOTRIMAZOLE 10 MG MT TROC
10.0000 mg | Freq: Every day | OROMUCOSAL | Status: DC
  Administered 2014-02-01 – 2014-02-05 (×18): 10 mg via ORAL
  Filled 2014-01-31 (×27): qty 1

## 2014-01-31 MED ORDER — IPRATROPIUM BROMIDE 0.02 % IN SOLN
0.5000 mg | Freq: Once | RESPIRATORY_TRACT | Status: AC
  Administered 2014-01-31: 0.5 mg via RESPIRATORY_TRACT
  Filled 2014-01-31: qty 2.5

## 2014-01-31 MED ORDER — SODIUM CHLORIDE 0.9 % IV SOLN: 250.0000 mL | INTRAVENOUS | Status: DC | PRN

## 2014-01-31 MED ORDER — CHLORHEXIDINE GLUCONATE 0.12 % MT SOLN
15.0000 mL | Freq: Two times a day (BID) | OROMUCOSAL | Status: DC
  Administered 2014-01-31 – 2014-02-05 (×10): 15 mL via OROMUCOSAL
  Filled 2014-01-31 (×12): qty 15

## 2014-01-31 MED ORDER — ASPIRIN EC 81 MG PO TBEC
81.0000 mg | DELAYED_RELEASE_TABLET | Freq: Every day | ORAL | Status: DC
  Administered 2014-02-01 – 2014-02-05 (×5): 81 mg via ORAL
  Filled 2014-01-31 (×5): qty 1

## 2014-01-31 MED ORDER — ONDANSETRON HCL 4 MG PO TABS: 4.0000 mg | ORAL_TABLET | Freq: Four times a day (QID) | ORAL | Status: DC | PRN

## 2014-01-31 MED ORDER — ACETAMINOPHEN 650 MG RE SUPP: 650.0000 mg | Freq: Four times a day (QID) | RECTAL | Status: DC | PRN

## 2014-01-31 MED ORDER — IRBESARTAN 75 MG PO TABS
75.0000 mg | ORAL_TABLET | Freq: Every day | ORAL | Status: DC
  Filled 2014-01-31: qty 1

## 2014-01-31 MED ORDER — IPRATROPIUM-ALBUTEROL 0.5-2.5 (3) MG/3ML IN SOLN
3.0000 mL | RESPIRATORY_TRACT | Status: DC
  Administered 2014-02-01 (×6): 3 mL via RESPIRATORY_TRACT
  Filled 2014-01-31 (×6): qty 3

## 2014-01-31 MED ORDER — DOXAZOSIN MESYLATE 8 MG PO TABS
8.0000 mg | ORAL_TABLET | Freq: Every day | ORAL | Status: DC
  Administered 2014-02-01 – 2014-02-04 (×4): 8 mg via ORAL
  Filled 2014-01-31 (×6): qty 1

## 2014-01-31 MED ORDER — NITROGLYCERIN 0.4 MG SL SUBL
0.4000 mg | SUBLINGUAL_TABLET | SUBLINGUAL | Status: DC | PRN
  Administered 2014-02-04 (×3): 0.4 mg via SUBLINGUAL
  Filled 2014-01-31: qty 1

## 2014-01-31 MED ORDER — HYDROCODONE-ACETAMINOPHEN 10-325 MG PO TABS
1.0000 | ORAL_TABLET | Freq: Every day | ORAL | Status: DC
  Administered 2014-01-31 – 2014-02-05 (×23): 1 via ORAL
  Filled 2014-01-31 (×23): qty 1

## 2014-01-31 MED ORDER — BIOTENE DRY MOUTH MT LIQD
15.0000 mL | Freq: Two times a day (BID) | OROMUCOSAL | Status: DC
  Administered 2014-02-01 – 2014-02-04 (×7): 15 mL via OROMUCOSAL

## 2014-01-31 MED ORDER — ALPRAZOLAM 1 MG PO TABS
1.0000 mg | ORAL_TABLET | Freq: Three times a day (TID) | ORAL | Status: DC | PRN
  Administered 2014-01-31 – 2014-02-04 (×8): 1 mg via ORAL
  Filled 2014-01-31 (×11): qty 1

## 2014-01-31 MED ORDER — MOMETASONE FURO-FORMOTEROL FUM 100-5 MCG/ACT IN AERO
2.0000 | INHALATION_SPRAY | Freq: Two times a day (BID) | RESPIRATORY_TRACT | Status: DC
  Administered 2014-02-01 – 2014-02-05 (×10): 2 via RESPIRATORY_TRACT
  Filled 2014-01-31: qty 8.8

## 2014-01-31 MED ORDER — ACETAMINOPHEN 325 MG PO TABS: 650.0000 mg | ORAL_TABLET | Freq: Four times a day (QID) | ORAL | Status: DC | PRN

## 2014-01-31 MED ORDER — METHYLPREDNISOLONE SODIUM SUCC 125 MG IJ SOLR
60.0000 mg | Freq: Four times a day (QID) | INTRAMUSCULAR | Status: DC
  Administered 2014-01-31 – 2014-02-03 (×11): 60 mg via INTRAVENOUS
  Filled 2014-01-31 (×14): qty 0.96

## 2014-01-31 MED ORDER — MORPHINE SULFATE 2 MG/ML IJ SOLN
1.0000 mg | INTRAMUSCULAR | Status: DC | PRN
  Administered 2014-02-01 (×2): 1 mg via INTRAVENOUS
  Filled 2014-01-31 (×2): qty 1

## 2014-01-31 MED ORDER — DEXTROSE 5 % IV SOLN: 500.0000 mg | Freq: Once | INTRAVENOUS | Status: DC

## 2014-01-31 MED ORDER — SODIUM CHLORIDE 0.9 % IJ SOLN: 3.0000 mL | INTRAMUSCULAR | Status: DC | PRN

## 2014-01-31 MED ORDER — SODIUM CHLORIDE 0.9 % IJ SOLN
3.0000 mL | Freq: Two times a day (BID) | INTRAMUSCULAR | Status: DC
  Administered 2014-01-31 – 2014-02-02 (×2): 3 mL via INTRAVENOUS

## 2014-01-31 NOTE — Progress Notes (Addendum)
RT attempted ABG X's 3 without success, Admitting MD Dr. Benny Lennert aware.  RT to monitor and assess as needed.

## 2014-01-31 NOTE — Progress Notes (Signed)
Pt refuses continuous pulse ox because then he can't get up and move around and "his nerves become shot"

## 2014-01-31 NOTE — Telephone Encounter (Signed)
New message     FYI Pt is in the heart failure program.  He is reporting increased swelling in face and abd,  He is weak while on oxygen.  He was having chest pain--per nurse pt was told to take nitro.  Nitro did not help so pt called 911 and is on his way to the hosp.

## 2014-01-31 NOTE — ED Provider Notes (Signed)
CSN: [id number]     Arrival date & time 01/31/14  1713 History   First MD Initiated Contact with Patient 01/31/14 1726     Chief Complaint  Patient presents with  . Chest Pain  . Shortness of Breath     (Consider location/radiation/quality/duration/timing/severity/associated sxs/prior Treatment) HPI Comments: Patient here after developing substernal chest pain which began today. Symptoms have been intermittent and lasts for approximately 10 minutes and have been associated with dyspnea diaphoresis. Pain is localized to left side of his chest characterizes sharp. Denies any leg pain or swelling. Does have a history of coronary artery disease and has coronary artery stent. Review the old chart shows that he heart catheterization 5 months ago which did not show any new occlusions. Patient to 3 nitroglycerin which did not rate his symptoms. Was given 2 nitroglycerin by EMS along with aspirin. Patient refused to go to [facility] and presents here. He does also note increased cough without fever. He has a history of COPD  Patient is a 68 y.o. male presenting with chest pain and shortness of breath. The history is provided by the patient.  Chest Pain Associated symptoms: shortness of breath   Shortness of Breath Associated symptoms: chest pain     Past Medical History  Diagnosis Date  . CAD (coronary artery disease)     a. s/p CABG x 5;  b. 04/2012 Cath: patent LIMA->LAD and VG->OM3, all other grafts occluded.  . Hypertension   . Anxiety   . History of kidney cancer   . Adjustment disorder with anxiety   . Hyperlipidemia   . BPH (benign prostatic hypertrophy)   . COPD (chronic obstructive pulmonary disease)     a. on home O2.  . Arthritis   . Ischemic cardiomyopathy     a. 06/2013 Echo: EF 30-35%, no reg wma's, Gr2 DD, triv AI, mod dil LA, nl RV fxn.  . Chronic systolic CHF (congestive heart failure)     a. 06/2013 Echo: EF 30-35%.  . ADENOCARCINOMA, PROSTATE dx'd 2012  .  Renal cancer dx'd 1997    lt nephrectomy   Past Surgical History  Procedure Laterality Date  . Coronary artery bypass graft      x 5  . Cardiac catheterization    . Nephrectomy      left nephrectomy for ca  . Posterior cervical laminectomy      x 8   limited ROM  and can't lie flat  . Heart stents      x 5  . Robot assisted laparoscopic radical prostatectomy      for prostate cancer  . Cystoscopy with litholapaxy  05/01/2012    Procedure: CYSTOSCOPY WITH LITHOLAPAXY;  Surgeon: Dutch Gray, MD;  Location: [facility];  Service: Urology;  Laterality: N/A;  . Prostate cancer    . Kidney surgery Left 1994   Family History  Problem Relation Age of Onset  . Diabetes Mother   . Heart disease Mother   . Hypertension Mother   . Heart attack Mother   . Coronary artery disease    . Heart disease Father   . Diabetes Father   . Heart attack Father   . Heart disease Brother   . Heart attack Brother   . COPD Sister    History  Substance Use Topics  . Smoking status: Current Some Day Smoker -- 0.30 packs/day for 54 years    Types: Cigarettes    Last Attempt to Quit: 03/29/2013  . Smokeless  tobacco: Former Systems developer    Quit date: 04/19/2012     Comment: Using e-cig now/  . Alcohol Use: No    Review of Systems  Respiratory: Positive for shortness of breath.   Cardiovascular: Positive for chest pain.  All other systems reviewed and are negative.     Allergies  Ativan; Lisinopril; Morphine and related; and Ibuprofen  Home Medications   Prior to Admission medications   Medication Sig Start Date End Date Taking? Authorizing Provider  albuterol (PROVENTIL HFA;VENTOLIN HFA) 108 (90 BASE) MCG/ACT inhaler Inhale 2 puffs into the lungs every 6 (six) hours as needed for wheezing or shortness of breath. Shortness of breath 11/02/13   Domenic Polite, MD  albuterol (PROVENTIL) (2.5 MG/3ML) 0.083% nebulizer solution Take 2.5 mg by nebulization 4 (four) times daily. 04/06/13   Erick Colace, NP   ALPRAZolam Duanne Moron) 1 MG tablet Take 1 tablet (1 mg total) by mouth 3 (three) times daily as needed for anxiety. 11/09/13   Theodis Blaze, MD  apixaban (ELIQUIS) 5 MG TABS tablet Take 1 tablet (5 mg total) by mouth 2 (two) times daily. 11/09/13   Theodis Blaze, MD  aspirin EC 81 MG tablet Take 81 mg by mouth daily.    Historical Provider, MD  clotrimazole (MYCELEX) 10 MG troche Take 1 tablet (10 mg total) by mouth 5 (five) times daily. 01/29/14   Tammy S Parrett, NP  doxazosin (CARDURA) 8 MG tablet Take 8 mg by mouth at bedtime.     Historical Provider, MD  furosemide (LASIX) 40 MG tablet Take 0.5 tablets (20 mg total) by mouth 2 (two) times daily. Resume in 2 days. 01/11/14   Eugenie Filler, MD  HYDROmorphone (DILAUDID) 4 MG tablet Take 4 mg by mouth at bedtime.  11/09/13   Theodis Blaze, MD  ipratropium (ATROVENT) 0.02 % nebulizer solution Take 0.5 mg by nebulization 4 (four) times daily.    Historical Provider, MD  Ipratropium-Albuterol (COMBIVENT RESPIMAT) 20-100 MCG/ACT AERS respimat Inhale 1 puff into the lungs every 6 (six) hours as needed for wheezing or shortness of breath. 11/02/13   Domenic Polite, MD  mometasone-formoterol (DULERA) 100-5 MCG/ACT AERO Inhale 2 puffs into the lungs 2 (two) times daily. 11/02/13   Domenic Polite, MD  NITROSTAT 0.3 MG SL tablet Place 0.3 mg under the tongue every 5 (five) minutes as needed for chest pain.  06/15/13   Historical Provider, MD  NON FORMULARY Place 2 L into the nose at bedtime. O2 at night only    Historical Provider, MD  predniSONE (DELTASONE) 10 MG tablet 4 tabs for 2 days, then 3 tabs for 2 days, 2 tabs for 2 days, then 1 tab for 2 days, then stop 01/29/14   Tammy S Parrett, NP  predniSONE (DELTASONE) 20 MG tablet Take 3 tablets (60 mg total) by mouth daily with breakfast. Take 3 tablets ( 60mg )  2 times daily x 2 days, then 3 tablets (60mg ) daily x 2 days, then 2 tablets (40mg ) daily x 2 days, then 1 tablet (20mg ) daily x 2 days then stop. 01/09/14    Eugenie Filler, MD  valsartan (DIOVAN) 40 MG tablet Take 40 mg by mouth daily.  11/09/13   Historical Provider, MD   BP 121/102  Pulse 115  Temp(Src) 98.4 F (36.9 C) (Oral)  Resp 22  SpO2 96% Physical Exam  Nursing note and vitals reviewed. Constitutional: He is oriented to person, place, and time. He appears well-developed and well-nourished.  Non-toxic appearance. No distress.  HENT:  Head: Normocephalic and atraumatic.  Eyes: Conjunctivae, EOM and lids are normal. Pupils are equal, round, and reactive to light.  Neck: Normal range of motion. Neck supple. No tracheal deviation present. No mass present.  Cardiovascular: Normal rate, regular rhythm and normal heart sounds.  Exam reveals no gallop.   No murmur heard. Pulmonary/Chest: Effort normal. No stridor. No respiratory distress. He has decreased breath sounds. He has wheezes. He has no rhonchi. He has no rales.  Abdominal: Soft. Normal appearance and bowel sounds are normal. He exhibits no distension. There is no tenderness. There is no rebound and no CVA tenderness.  Musculoskeletal: Normal range of motion. He exhibits no edema and no tenderness.  Neurological: He is alert and oriented to person, place, and time. He has normal strength. No cranial nerve deficit or sensory deficit. GCS eye subscore is 4. GCS verbal subscore is 5. GCS motor subscore is 6.  Skin: Skin is warm and dry. No abrasion and no rash noted.  Psychiatric: He has a normal mood and affect. His speech is normal and behavior is normal.    ED Course  Procedures (including critical care time) Labs Review Labs Reviewed  CBC  BASIC METABOLIC PANEL  PRO B NATRIURETIC PEPTIDE  TROPONIN I  Randolm Idol, ED    Imaging Review Dg Chest Port 1 View  01/31/2014   CLINICAL DATA:  Shortness of breath.  EXAM: PORTABLE CHEST - 1 VIEW  COMPARISON:  CT chest 01/07/2014. PA and lateral chest 01/21/2014. Single view of the chest 01/07/2014.  FINDINGS: The patient is  status post CABG. Lung volumes are low with basilar atelectasis. Heart size is normal. No pneumothorax or pleural effusion.  IMPRESSION: No acute finding in a low volume chest.   Electronically Signed   By: Inge Rise M.D.   On: 01/31/2014 17:40     EKG Interpretation   Date/Time:  Thursday January 31 2014 17:21:42 EDT Ventricular Rate:  116 PR Interval:  141 QRS Duration: 96 QT Interval:  349 QTC Calculation: 485 R Axis:   63 Text Interpretation:  Sinus tachycardia Ventricular premature complex  Anteroseptal infarct, old Borderline repolarization abnormality Baseline  wander in lead(s) II III aVF V2 Confirmed by Jarissa Sheriff  MD, Zan Orlick ([id number]) on  01/31/2014 5:47:07 PM      MDM   Final diagnoses:  None    Patient given albuterol with Atrovent here along with soluMedrol for treatment of his COPD. He was given 2 doses of hydromorphone for treatment of his pain. Will give dose of IV Lasix here for possible early CHF given his elevated BNP. Do not think this is ACS at this time and troponin here is within normal limits. He will be admitted to observation status for further management    Leota Jacobsen, MD 01/31/14 2100

## 2014-01-31 NOTE — ED Notes (Addendum)
[facility]stat Troponin processed at 1734 with result of 0.01  MD notified at [facility] lab results

## 2014-01-31 NOTE — ED Notes (Signed)
Per EMS: Pt from home c/o substernal chest pain, nausea and SOB that started this morning.  Cardiac hx w/ stents.  Refused cone.  Took 3 home nitros, 2 nitros w/ EMS w/ no relief.  Pt also has taken 324 of aspirin.

## 2014-01-31 NOTE — ED Notes (Signed)
Janett Billow, RT requested for ABG.

## 2014-01-31 NOTE — Telephone Encounter (Signed)
Routed to Dr. Johnsie Cancel as Juluis Rainier.

## 2014-01-31 NOTE — Progress Notes (Signed)
Pt yelling and cursing stating that he thought he was going to be able to eat and now states we are trying to starve him are just trying to be mean. RN attempted to educate patient that the MD is likely trying to evaluate cardiac status and does not want to allow the patient to eat in case cardiac markers come back positive and patient were to need a cardiac cath for which the patient would need to be NPO. Pt states he would refuse any procedure without talking to his regular cardiologist so he should be able to eat now because of this. Pt complaining that he is nauseated because his orders state he is not allowed to eat. Pt was given his xanax but repeatedly states that he is anxious and "his nerves are shot" because of the way this hospital is treating him. Will continue to try to educate patient and monitor him.

## 2014-01-31 NOTE — Telephone Encounter (Signed)
See below

## 2014-02-01 ENCOUNTER — Encounter: Payer: Self-pay | Admitting: Family

## 2014-02-01 ENCOUNTER — Encounter (HOSPITAL_COMMUNITY): Payer: Self-pay | Admitting: Physician Assistant

## 2014-02-01 DIAGNOSIS — I70219 Atherosclerosis of native arteries of extremities with intermittent claudication, unspecified extremity: Secondary | ICD-10-CM

## 2014-02-01 DIAGNOSIS — Z833 Family history of diabetes mellitus: Secondary | ICD-10-CM | POA: Diagnosis not present

## 2014-02-01 DIAGNOSIS — J9620 Acute and chronic respiratory failure, unspecified whether with hypoxia or hypercapnia: Secondary | ICD-10-CM | POA: Diagnosis present

## 2014-02-01 DIAGNOSIS — J440 Chronic obstructive pulmonary disease with acute lower respiratory infection: Secondary | ICD-10-CM | POA: Diagnosis present

## 2014-02-01 DIAGNOSIS — Z79899 Other long term (current) drug therapy: Secondary | ICD-10-CM | POA: Diagnosis not present

## 2014-02-01 DIAGNOSIS — I5023 Acute on chronic systolic (congestive) heart failure: Secondary | ICD-10-CM | POA: Diagnosis present

## 2014-02-01 DIAGNOSIS — N183 Chronic kidney disease, stage 3 unspecified: Secondary | ICD-10-CM | POA: Diagnosis present

## 2014-02-01 DIAGNOSIS — J449 Chronic obstructive pulmonary disease, unspecified: Secondary | ICD-10-CM

## 2014-02-01 DIAGNOSIS — I2589 Other forms of chronic ischemic heart disease: Secondary | ICD-10-CM

## 2014-02-01 DIAGNOSIS — I4891 Unspecified atrial fibrillation: Secondary | ICD-10-CM | POA: Diagnosis present

## 2014-02-01 DIAGNOSIS — M129 Arthropathy, unspecified: Secondary | ICD-10-CM | POA: Diagnosis present

## 2014-02-01 DIAGNOSIS — I480 Paroxysmal atrial fibrillation: Secondary | ICD-10-CM | POA: Diagnosis present

## 2014-02-01 DIAGNOSIS — Z7982 Long term (current) use of aspirin: Secondary | ICD-10-CM | POA: Diagnosis not present

## 2014-02-01 DIAGNOSIS — I509 Heart failure, unspecified: Secondary | ICD-10-CM | POA: Diagnosis not present

## 2014-02-01 DIAGNOSIS — Z9861 Coronary angioplasty status: Secondary | ICD-10-CM | POA: Diagnosis not present

## 2014-02-01 DIAGNOSIS — R918 Other nonspecific abnormal finding of lung field: Secondary | ICD-10-CM

## 2014-02-01 DIAGNOSIS — E785 Hyperlipidemia, unspecified: Secondary | ICD-10-CM | POA: Diagnosis present

## 2014-02-01 DIAGNOSIS — G8929 Other chronic pain: Secondary | ICD-10-CM | POA: Diagnosis present

## 2014-02-01 DIAGNOSIS — Z85528 Personal history of other malignant neoplasm of kidney: Secondary | ICD-10-CM | POA: Diagnosis not present

## 2014-02-01 DIAGNOSIS — R079 Chest pain, unspecified: Secondary | ICD-10-CM

## 2014-02-01 DIAGNOSIS — F17200 Nicotine dependence, unspecified, uncomplicated: Secondary | ICD-10-CM | POA: Diagnosis present

## 2014-02-01 DIAGNOSIS — N400 Enlarged prostate without lower urinary tract symptoms: Secondary | ICD-10-CM | POA: Diagnosis present

## 2014-02-01 DIAGNOSIS — I739 Peripheral vascular disease, unspecified: Secondary | ICD-10-CM

## 2014-02-01 DIAGNOSIS — Z905 Acquired absence of kidney: Secondary | ICD-10-CM | POA: Diagnosis not present

## 2014-02-01 DIAGNOSIS — Z8546 Personal history of malignant neoplasm of prostate: Secondary | ICD-10-CM | POA: Diagnosis not present

## 2014-02-01 DIAGNOSIS — Z951 Presence of aortocoronary bypass graft: Secondary | ICD-10-CM | POA: Diagnosis not present

## 2014-02-01 DIAGNOSIS — I129 Hypertensive chronic kidney disease with stage 1 through stage 4 chronic kidney disease, or unspecified chronic kidney disease: Secondary | ICD-10-CM | POA: Diagnosis present

## 2014-02-01 DIAGNOSIS — Z9981 Dependence on supplemental oxygen: Secondary | ICD-10-CM | POA: Diagnosis not present

## 2014-02-01 DIAGNOSIS — R0609 Other forms of dyspnea: Secondary | ICD-10-CM

## 2014-02-01 DIAGNOSIS — F411 Generalized anxiety disorder: Secondary | ICD-10-CM | POA: Diagnosis present

## 2014-02-01 DIAGNOSIS — J441 Chronic obstructive pulmonary disease with (acute) exacerbation: Secondary | ICD-10-CM | POA: Diagnosis not present

## 2014-02-01 DIAGNOSIS — J209 Acute bronchitis, unspecified: Secondary | ICD-10-CM | POA: Diagnosis not present

## 2014-02-01 DIAGNOSIS — I2510 Atherosclerotic heart disease of native coronary artery without angina pectoris: Secondary | ICD-10-CM | POA: Diagnosis present

## 2014-02-01 DIAGNOSIS — R7309 Other abnormal glucose: Secondary | ICD-10-CM

## 2014-02-01 DIAGNOSIS — Z8249 Family history of ischemic heart disease and other diseases of the circulatory system: Secondary | ICD-10-CM | POA: Diagnosis not present

## 2014-02-01 DIAGNOSIS — I5022 Chronic systolic (congestive) heart failure: Secondary | ICD-10-CM | POA: Diagnosis present

## 2014-02-01 DIAGNOSIS — R0989 Other specified symptoms and signs involving the circulatory and respiratory systems: Secondary | ICD-10-CM

## 2014-02-01 DIAGNOSIS — Z66 Do not resuscitate: Secondary | ICD-10-CM | POA: Diagnosis present

## 2014-02-01 DIAGNOSIS — G894 Chronic pain syndrome: Secondary | ICD-10-CM | POA: Diagnosis present

## 2014-02-01 LAB — CBC
HCT: 40.2 % (ref 39.0–52.0)
Hemoglobin: 12.7 g/dL — ABNORMAL LOW (ref 13.0–17.0)
MCH: 29.6 pg (ref 26.0–34.0)
MCHC: 31.6 g/dL (ref 30.0–36.0)
MCV: 93.7 fL (ref 78.0–100.0)
PLATELETS: 191 10*3/uL (ref 150–400)
RBC: 4.29 MIL/uL (ref 4.22–5.81)
RDW: 15.9 % — ABNORMAL HIGH (ref 11.5–15.5)
WBC: 7.5 10*3/uL (ref 4.0–10.5)

## 2014-02-01 LAB — BASIC METABOLIC PANEL
Anion gap: 18 — ABNORMAL HIGH (ref 5–15)
BUN: 35 mg/dL — ABNORMAL HIGH (ref 6–23)
CO2: 23 mEq/L (ref 19–32)
Calcium: 9.4 mg/dL (ref 8.4–10.5)
Chloride: 97 mEq/L (ref 96–112)
Creatinine, Ser: 1.6 mg/dL — ABNORMAL HIGH (ref 0.50–1.35)
GFR calc non Af Amer: 43 mL/min — ABNORMAL LOW (ref >90)
GFR, EST AFRICAN AMERICAN: 49 mL/min — AB (ref >90)
Glucose, Bld: 212 mg/dL — ABNORMAL HIGH (ref 70–99)
Potassium: 4.5 mEq/L (ref 3.7–5.3)
SODIUM: 138 meq/L (ref 137–147)

## 2014-02-01 LAB — TROPONIN I: Troponin I: <0.3 ng/mL (ref <0.30)

## 2014-02-01 LAB — POCT I-STAT TROPONIN I: TROPONIN I, POC: 0.01 ng/mL (ref 0.00–0.08)

## 2014-02-01 MED ORDER — NICOTINE 14 MG/24HR TD PT24
14.0000 mg | MEDICATED_PATCH | Freq: Every day | TRANSDERMAL | Status: DC
  Administered 2014-02-01 – 2014-02-05 (×5): 14 mg via TRANSDERMAL
  Filled 2014-02-01 (×5): qty 1

## 2014-02-01 MED ORDER — IPRATROPIUM-ALBUTEROL 0.5-2.5 (3) MG/3ML IN SOLN
3.0000 mL | Freq: Four times a day (QID) | RESPIRATORY_TRACT | Status: DC
  Administered 2014-02-02 – 2014-02-05 (×13): 3 mL via RESPIRATORY_TRACT
  Filled 2014-02-01 (×13): qty 3

## 2014-02-01 MED ORDER — FUROSEMIDE 10 MG/ML IJ SOLN
20.0000 mg | Freq: Two times a day (BID) | INTRAMUSCULAR | Status: DC
  Administered 2014-02-01 – 2014-02-02 (×3): 20 mg via INTRAVENOUS
  Filled 2014-02-01 (×5): qty 2

## 2014-02-01 MED ORDER — HYDROMORPHONE HCL PF 2 MG/ML IJ SOLN
1.0000 mg | INTRAMUSCULAR | Status: DC | PRN
  Administered 2014-02-01 – 2014-02-03 (×13): 1 mg via INTRAVENOUS
  Filled 2014-02-01 (×14): qty 1

## 2014-02-01 NOTE — Progress Notes (Signed)
Pt stating he needs something to eat or he will leave. Pt wants to speak with MD about "horrible treatment" and "being mean" by not giving him food. Pt becoming increasingly agitated. Will continue to monitor.

## 2014-02-01 NOTE — H&P (Signed)
Lucas [name] is an 68 y.o. male.   Chief Complaint: Shortness of breath and chest tightness. HPI: Pt is a 68 yr old male who is chronically ill.  He states that this illness began this morning.  He states that he has increased cough and wheezing.  He denies fevers or chills.  He states that the chest tightness is worse with deep respiration and cough. He states that the cough is somewhat worse than his usual chronic cough. It occasionally is productive of clear sputum. He is found to be hypoxic upon arrival in the ED.  Past Medical History  Diagnosis Date  . CAD (coronary artery disease)     a. s/p CABG x 5;  b. 04/2012 Cath: patent LIMA->LAD and VG->OM3, all other grafts occluded.  . Hypertension   . Anxiety   . History of kidney cancer   . Adjustment disorder with anxiety   . Hyperlipidemia   . BPH (benign prostatic hypertrophy)   . COPD (chronic obstructive pulmonary disease)     a. on home O2.  . Arthritis   . Ischemic cardiomyopathy     a. 06/2013 Echo: EF 30-35%, no reg wma's, Gr2 DD, triv AI, mod dil LA, nl RV fxn.  . Chronic systolic CHF (congestive heart failure)     a. 06/2013 Echo: EF 30-35%.  . ADENOCARCINOMA, PROSTATE dx'd 2012  . Renal cancer dx'd 1997    lt nephrectomy    Past Surgical History  Procedure Laterality Date  . Coronary artery bypass graft      x 5  . Cardiac catheterization    . Nephrectomy      left nephrectomy for ca  . Posterior cervical laminectomy      x 8   limited ROM  and can't lie flat  . Heart stents      x 5  . Robot assisted laparoscopic radical prostatectomy      for prostate cancer  . Cystoscopy with litholapaxy  05/01/2012    Procedure: CYSTOSCOPY WITH LITHOLAPAXY;  Surgeon: Dutch Gray, MD;  Location: [facility];  Service: Urology;  Laterality: N/A;  . Prostate cancer    . Kidney surgery Left 1994    Family History  Problem Relation Age of Onset  . Diabetes Mother   . Heart disease Mother   . Hypertension Mother   . Heart  attack Mother   . Coronary artery disease    . Heart disease Father   . Diabetes Father   . Heart attack Father   . Heart disease Brother   . Heart attack Brother   . COPD Sister    Social History:  reports that he has been smoking Cigarettes.  He has a 16.2 pack-year smoking history. He quit smokeless tobacco use about 21 months ago. He reports that he does not drink alcohol or use illicit drugs.  Allergies:  Allergies  Allergen Reactions  . Ativan [Lorazepam] Other (See Comments)    Increases agitation, tolerates xanax  . Lisinopril Cough  . Morphine And Related     Hallucinations, too sedated when given with ativan  . Ibuprofen Hives, Nausea And Vomiting and Rash    Tolerates baby aspirin per patient.      Medications Prior to Admission  Medication Sig Dispense Refill  . albuterol (PROVENTIL HFA;VENTOLIN HFA) 108 (90 BASE) MCG/ACT inhaler Inhale 2 puffs into the lungs every 6 (six) hours as needed for wheezing or shortness of breath. Shortness of breath  1 Inhaler  1  .  albuterol (PROVENTIL) (2.5 MG/3ML) 0.083% nebulizer solution Take 2.5 mg by nebulization 4 (four) times daily.      Marland Kitchen ALPRAZolam (XANAX) 1 MG tablet Take 1 tablet (1 mg total) by mouth 3 (three) times daily as needed for anxiety.  30 tablet  1  . apixaban (ELIQUIS) 5 MG TABS tablet Take 1 tablet (5 mg total) by mouth 2 (two) times daily.  60 tablet  1  . aspirin EC 81 MG tablet Take 81 mg by mouth daily.      . clotrimazole (MYCELEX) 10 MG troche Take 1 tablet (10 mg total) by mouth 5 (five) times daily.  35 tablet  0  . doxazosin (CARDURA) 8 MG tablet Take 8 mg by mouth at bedtime.       . furosemide (LASIX) 40 MG tablet Take 0.5 tablets (20 mg total) by mouth 2 (two) times daily. Resume in 2 days.  30 tablet  0  . HYDROcodone-acetaminophen (NORCO) 10-325 MG per tablet Take 1 tablet by mouth 5 (five) times daily.      Marland Kitchen HYDROmorphone (DILAUDID) 4 MG tablet Take 4 mg by mouth at bedtime.       Marland Kitchen ipratropium  (ATROVENT) 0.02 % nebulizer solution Take 0.5 mg by nebulization 4 (four) times daily.      . Ipratropium-Albuterol (COMBIVENT RESPIMAT) 20-100 MCG/ACT AERS respimat Inhale 1 puff into the lungs every 6 (six) hours as needed for wheezing or shortness of breath.  1 Inhaler  1  . mometasone-formoterol (DULERA) 100-5 MCG/ACT AERO Inhale 2 puffs into the lungs 2 (two) times daily.  1 Inhaler  0  . NITROSTAT 0.3 MG SL tablet Place 0.3 mg under the tongue every 5 (five) minutes as needed for chest pain.       . NON FORMULARY Place 2 L into the nose at bedtime. O2 at night only      . predniSONE (DELTASONE) 10 MG tablet 4 tabs for 2 days, then 3 tabs for 2 days, 2 tabs for 2 days, then 1 tab for 2 days, then stop  20 tablet  0  . valsartan (DIOVAN) 40 MG tablet Take 40 mg by mouth daily.         Results for orders placed during the hospital encounter of 01/31/14 (from the past 48 hour(s))  CBC     Status: Abnormal   Collection Time    01/31/14  5:24 PM      Result Value Ref Range   WBC 7.9  4.0 - 10.5 K/uL   RBC 4.25  4.22 - 5.81 MIL/uL   Hemoglobin 13.0  13.0 - 17.0 g/dL   HCT 39.3  39.0 - 52.0 %   MCV 92.5  78.0 - 100.0 fL   MCH 30.6  26.0 - 34.0 pg   MCHC 33.1  30.0 - 36.0 g/dL   RDW 15.9 (*) 11.5 - 15.5 %   Platelets 211  150 - 400 K/uL  BASIC METABOLIC PANEL     Status: Abnormal   Collection Time    01/31/14  5:24 PM      Result Value Ref Range   Sodium 141  137 - 147 mEq/L   Potassium 4.1  3.7 - 5.3 mEq/L   Chloride 101  96 - 112 mEq/L   CO2 24  19 - 32 mEq/L   Glucose, Bld 128 (*) 70 - 99 mg/dL   BUN 29 (*) 6 - 23 mg/dL   Creatinine, Ser 1.33  0.50 - 1.35 mg/dL  Calcium 10.0  8.4 - 10.5 mg/dL   GFR calc non Af Amer 53 (*) >90 mL/min   GFR calc Af Amer 62 (*) >90 mL/min   Comment: (NOTE)     The eGFR has been calculated using the CKD EPI equation.     This calculation has not been validated in all clinical situations.     eGFR's persistently <90 mL/min signify possible Chronic  Kidney     Disease.   Anion gap 16 (*) 5 - 15  PRO B NATRIURETIC PEPTIDE     Status: Abnormal   Collection Time    01/31/14  5:24 PM      Result Value Ref Range   Pro B Natriuretic peptide (BNP) 1616.0 (*) 0 - 125 pg/mL  TROPONIN I     Status: None   Collection Time    01/31/14  7:59 PM      Result Value Ref Range   Troponin I <0.30  <0.30 ng/mL   Comment:            Due to the release kinetics of cTnI,     a negative result within the first hours     of the onset of symptoms does not rule out     myocardial infarction with certainty.     If myocardial infarction is still suspected,     repeat the test at appropriate intervals.  TROPONIN I     Status: None   Collection Time    01/31/14 10:34 PM      Result Value Ref Range   Troponin I <0.30  <0.30 ng/mL   Comment:            Due to the release kinetics of cTnI,     a negative result within the first hours     of the onset of symptoms does not rule out     myocardial infarction with certainty.     If myocardial infarction is still suspected,     repeat the test at appropriate intervals.   Dg Chest Port 1 View  01/31/2014   CLINICAL DATA:  Shortness of breath.  EXAM: PORTABLE CHEST - 1 VIEW  COMPARISON:  CT chest 01/07/2014. PA and lateral chest 01/21/2014. Single view of the chest 01/07/2014.  FINDINGS: The patient is status post CABG. Lung volumes are low with basilar atelectasis. Heart size is normal. No pneumothorax or pleural effusion.  IMPRESSION: No acute finding in a low volume chest.   Electronically Signed   By: Inge Rise M.D.   On: 01/31/2014 17:40    Review of Systems  Constitutional: Positive for malaise/fatigue and diaphoresis. Negative for fever and chills.  HENT: Negative for congestion and sore throat.   Eyes: Negative for blurred vision, double vision, pain and redness.  Respiratory: Positive for cough, sputum production, shortness of breath and wheezing. Negative for hemoptysis and stridor.         Positive for increased work of breathing including tachypnea and accessory muscle use.  He states that he occasionally coughs up clear sputum. His cough is increased over his chronic cough.   Cardiovascular: Positive for chest pain and leg swelling. Negative for palpitations, orthopnea and PND.       Chest pain is pleuritic. It is worse with deep inspiration and cough. Pt states that it is 10/10 in severity and he describes it as a tightness.  It is like a band cross his chest.  It has been continuous since this morning.  Gastrointestinal: Negative for heartburn, nausea, vomiting, abdominal pain, diarrhea, constipation and melena.  Genitourinary: Negative for dysuria, urgency, frequency, hematuria and flank pain.  Musculoskeletal: Positive for joint pain and myalgias. Negative for falls and neck pain.  Skin: Negative for itching and rash.  Neurological: Positive for weakness. Negative for dizziness, tingling, speech change, focal weakness, seizures, loss of consciousness and headaches.  Endo/Heme/Allergies: Negative for environmental allergies. Does not bruise/bleed easily.  Psychiatric/Behavioral: Negative for depression, suicidal ideas, hallucinations, memory loss and substance abuse. The patient is nervous/anxious. The patient does not have insomnia.     Blood pressure 155/73, pulse 89, temperature 98.1 F (36.7 C), temperature source Oral, resp. rate 14, height 5' 8"  (1.727 m), weight 95.5 kg (210 lb 8.6 oz), SpO2 95.00%. Physical Exam  Constitutional: He is oriented to person, place, and time. He appears distressed.  Pt is morbidly obese, and chronically and acutely ill appearing. He is awake, alert, and oriented x 3.  HENT:  Head: Normocephalic and atraumatic.  Nose: Nose normal.  Mouth/Throat: Oropharynx is clear and moist. No oropharyngeal exudate.  Eyes: Conjunctivae and EOM are normal. Pupils are equal, round, and reactive to light. Right eye exhibits no discharge. Left eye exhibits no  discharge. No scleral icterus.  Neck: Normal range of motion. Neck supple. No JVD present. No tracheal deviation present. No thyromegaly present.  Cardiovascular: Normal rate, regular rhythm, normal heart sounds and intact distal pulses.  Exam reveals no gallop and no friction rub.   No murmur heard. Respiratory: No stridor. He is in respiratory distress. He has wheezes. He has no rales. He exhibits no tenderness.  Pt with increased work of breathing with tachypnea and accessory muscle use. Audible wheezes. No tactile fremitus.  GI: He exhibits no distension and no mass. There is no tenderness. There is no rebound and no guarding.  Musculoskeletal: He exhibits edema. He exhibits no tenderness.  B/L lower extremities have 1+ edema bilaterally. However, the patient's skin shows wrinkling. The patient admits that recently his swelling had been quite a bit worse.  Lymphadenopathy:    He has no cervical adenopathy.  Neurological: He is alert and oriented to person, place, and time. He has normal reflexes. He displays normal reflexes. No cranial nerve deficit. He exhibits normal muscle tone.  Skin: Skin is warm. No rash noted. He is diaphoretic. No erythema.  Psychiatric: He has a normal mood and affect. His behavior is normal. Judgment and thought content normal.     Assessment/Plan 1. COPD exacerbation - IV antibiotics, nebulizer treatments and IV steroids 2. Acute bronchitis - IV antibiotics, nebulizer treatments, and IV steroids 3. Chest pain  - Pain control, rule out MI by EKG and enzyme criteria. The patient underwent LHC in 08/2013. It found no change in his baseline disease. 4. CAD - ROMI by EKG and enzyme criteria. 5. Hypertension - continue home medication 6. Chronic CHF - Continue home medications and monitor I's and O's carefully.  Bristyn Kulesza 02/01/2014, 3:16 AM

## 2014-02-01 NOTE — Progress Notes (Addendum)
Pt expressing dissatisfaction with how he is receiving pain medicines while here in the hospital. Reviewed patient's PTA meds and frequency of dilaudid was not listed. Pt states he takes it every 4 hours a day at home, but also states he only takes 3 dilaudid pills a day. When trying to clarify with patient about the frequency he began to say how "Dr. Olen Pel knows what's right. I want her to be my doctor. She actually cares." Pt states if he doesn't start getting what he wants for his pain he will pack his bags and leave stating he done so in the past. Pt states he should be getting IV dilaudid for the random pains because it works better and that the morphine doesn't help, but he wants it any way. RN wrote on whiteboard to remind patient to ask MD about dilaudid frequency. Will continue to monitor.

## 2014-02-01 NOTE — Consult Note (Signed)
Cardiology Consultation Note  Patient ID: Lucas [name], MRN: [id number], DOB/AGE: 1946-07-01 68 y.o. Admit date: 01/31/2014   Date of Consult: 02/01/2014 Primary Physician: Salena Saner., MD Primary Cardiologist: Johnsie Cancel  Chief Complaint: SOB Reason for Consult: CHF  HPI: [name] is a 68 y/o chronically ill M with history of COPD with chronic respiratory failure on home O2, chronic systolic CHF EF 56-31%, remote renal cancer s/p nephrectomy with CKD stage III, CAD s/p CABGx5, anxiety, PAF, longstanding tobacco abuse, PAD s/p angioplasty 12/2013, chronic pain who presented to [facility] 01/31/2014 with increased cough, wheezing, chest tightness, and hypoxia. Last cath was 12/2013, stable anatomy from prior with patent SVG-OM 3 and LIMA to LAD, known chronic occlusion of all other vein grafts. Per NP office note from 11/2013, he was not taking Eliquis because he could not afford it -- prior authorization was called in and his cost was found to be $3.60 which he was then agreeable to. This is his 9th admission in the last year. Most of his hospitalizations involve COPD exacerbation, a/c hypoxia, +/- chest pain. This admission is for a similar spectrum of complaints as above. He was admitted for treatment of COPD exacerbation/acute bronchitis. pBNP was elevated at 1616 (previously 203), troponins neg x 5, CXR nonacute. Cr 1.33 ->1.60  Of note, recent CT angio 01/07/14 showed no PE/dissection, + COPD, + small pulmonary nodules measuring up to 98mm in RM/LL, f/u recommended 6-12 months.  On my evaluation he describes were all almost appears to be a chronic left-sided chest discomfort made worse with coughing or deep inspiration. He states it is at 10 in severity. He describes a tightness across his chest worse than the left side of his chest. He says that it is his heart symptoms. He has definitely been having more coughing and that is productive of clear sputum but not hemoptysis or  colored. He notes PND, orthopnea and edema. His diffuse aches and pains and symptoms of what sounds most like claudication. He has neck pain that triggers a lot of his other pains.  Past Medical History  Diagnosis Date  . CAD (coronary artery disease)     a. s/p CABG x 5;  b. 04/2012 Cath: patent LIMA->LAD and VG->OM3, all other grafts occluded. c. Cath 12/2013: patent SVG-OM3 & LIMA-LAD, known occ other VG.  Marland Kitchen Hypertension   . Anxiety   . Adjustment disorder with anxiety   . Hyperlipidemia     a. Unable to take statins.  Marland Kitchen BPH (benign prostatic hypertrophy)   . COPD (chronic obstructive pulmonary disease)     a. on home O2.  . Arthritis   . Ischemic cardiomyopathy     a. 06/2013 Echo: EF 30-35%, no reg wma's, Gr2 DD, triv AI, mod dil LA, nl RV fxn.  . Chronic systolic CHF (congestive heart failure)     a. 06/2013 Echo: EF 30-35%.  . Prostate cancer dx'd 2012  . Renal cancer dx'd 1997    lt nephrectomy  . CKD (chronic kidney disease), stage III   . Tobacco abuse     a. 33 yr hx as of 2015, transitioned to e-cig.  Marland Kitchen Syncope     a. 08/2013: the day following cath, no injury, had received multiple doses of Versed for anxiety - this was felt to be a contributing factor.  Marland Kitchen PAF (paroxysmal atrial fibrillation)     a. Per notes supposed to be on Eliquis but has previously not been able to afford, and  has denied wanting to take Coumadin or other NOAC.  Marland Kitchen Hypotension     a. H/o soft BP prohibiting ACEI/ARB use.  . Chronic pain   . PAD (peripheral artery disease)     a. 12/2013: PV angio s/p angioplasty to R external iliac artery stenosis, also has R SFA occlusion with reconstitution of above-knee popliteal artery, 2 vessel runoff via peroneal and posterior tib.  . Pulmonary nodules     a. 12/2013:  small pulmonary nodules measuring up to 59mm in RM/LL, f/u recommended 6-12 months.      Most Recent Cardiac Studies: Cardiac Cath 08/2013 Indication: Chest pain dyspnea prevoius CABG PVD with  possible need for vascular surgery  Meds: 6 mg versed and 50ug fentanyl  Right Heart:  Mean RA: 6 mmHg  RV: 35/1 mmHg  PA 26/4 mmHg mean 15 mmHg  PCWP: Mean 10 mmHg  Ao 117/68 mmHg  Ao sat 94%  PA sat 58%  CO 3.97 L/min  Coronary angiography:  Coronary dominance: right  Left mainstem: Patent with moderate diffuse disease.  Left anterior descending (LAD): Total proximal occlusion, heavy calcification.  Left circumflex (LCx): Severe diffuse disease with severe stenoses of the first and second obtuse marginal branches at the ostia. Subtotal occlusion of the mid left circumflex.  Right coronary artery (RCA): Total occlusion of the proximal vessel Known frim 20123  SVG - OM3 widely patent, supplies collateral to OM1/2 and RCA branches  SVG - diag total occlusion Known from 2013  SVG - RCA total occlusion Known from 2013  SVG OM4 total occlusion Known from 2013  LIMA - LAD widely patent, supplies collateral to PDA  Final Conclusions:  1. Severe three-vessel coronary artery disease  2. 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  3. Normal intracardiac hemodynamics  Recommendations: Continued medical therapy. His coronary anatomy is stable from his prior cardiac catheterization.  Please see separate note by Dr Burt Knack. PV runoff done. Long SFA occlusion. Not a great surgical candidate and doubt long SFA stent would have longevity Medical Rx  Of both PVC and CAD [place]  PV Angio 08/2013 Name: Lucas [name]  MRN: [id number]  DOB: 08/21/1945  Procedure: Suprarenal abdominal aortic angiogram, distal abdominal aortic angiogram with bilateral lower extremity runoff.  Indication: Severe intermittent claudication of the right leg.  Procedural details: Cardiac catheterization was performed by Dr. Johnsie Cancel using left femoral arterial access. We planned a combined cardiac  and peripheral vascular procedure since he had an indication for both. At the completion of the left heart catheterization, the pigtail catheter was brought down to the suprarenal abdominal aortic where a digitally subtracted angiogram was performed via a power injection. After that, the pigtail catheter was pulled down to the distal abdominal aorta and a bilateral lower extremity runoff was performed using a bolus chase technique. The patient tolerated the procedure well. There were no immediate complications. The patient was transferred to the post catheterization recovery area for further monitoring.  Procedural Findings:  Abdominal aortogram: The right renal artery is widely patent. The left renal artery is not visualized and it is possible the catheter is positioned below that vessel. There is no obstruction and a single right renal artery. The distal abdominal aorta is patent with mild dilatation present.  Right leg: The common iliac artery is patent. The external iliac artery is patent with mild diffuse  stenosis noted. The internal iliac artery is ectatic but patent. The common femoral artery is calcified with mild nonobstructive stenosis. The deep femoral artery is patent. The superficial femoral artery is diffusely diseased throughout its proximal segment until it occludes in the mid vessel. The vessel reconstitutes in Hunter's canal and is diffusely diseased into the above-knee popliteal. The popliteal artery is patent. The anterior tibial has a high origin and it is totally occluded. The posterior tibial is widely patent. The peroneal artery is patent.  Left leg: The common, external, and internal iliac arteries are patent. The SFA has diffuse moderate calcific stenosis in the midportion. The profundus is patent. The popliteal is patent with mild disease. The peroneal and posterior tibial vessels are patent. The anterior tibial is totally occluded.  Final Conclusions:  1. Minimal aortoiliac disease   2. 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 Moderate diffuse stenosis of the left superficial femoral artery  4. 2 vessel runoff bilaterally  Recommendations: I think medical therapy is probably the best approach here. I will bring the patient back to the office to review revascularization options. I do not think he is a candidate for surgical revascularization because of severe cardiopulmonary disease. Could consider stenting of his totally occluded SFA and will review those options with him.  Sherren Mocha  08/21/2013, 1:52 PM  PV Procedure 12/25/13 by Vascular 12/25/2013  Pre-operative Diagnosis: Right leg claudication  Post-operative diagnosis: Same  Surgeon: Eldridge Abrahams  Procedure Performed:  1. ultrasound-guided access, left femoral artery  2. abdominal aortogram  3. right lower extremity runoff  4. second order catheterization  5. angioplasty right external iliac artery  6. closure device (pro-glide)  Indications: The patient comes in with lifestyle limiting right leg claudication. This was documented by ultrasound. He has been very difficult to 2 were: From a cath perspective by cardiology. He has trouble lying flat and his legs continuously moved. For that reason I had anesthesia involve. This was done under MAC.  Procedure: The patient was identified in the holding area and taken to room 8. The patient was then placed supine on the table and prepped and draped in the usual sterile fashion. A time out was called. Ultrasound was used to evaluate the left common femoral artery. It was patent . A digital ultrasound image was acquired. A micropuncture needle was used to access the left common femoral artery under ultrasound guidance. An 018 wire was advanced without resistance and a micropuncture sheath was placed. The 018 wire was removed and a benson wire was placed. The micropuncture sheath was exchanged for a 5 french sheath. An omniflush catheter was  advanced over the wire to the level of L-1. An abdominal angiogram was obtained. Next, using the omniflush catheter and a benson wire, the aortic bifurcation was crossed and the catheter was placed into theright external iliac artery and right runoff was obtained.  Findings:  Aortogram: The suprarenal abdominal aorta is widely patent without significant stenosis. The right renal artery is widely patent. The left renal artery is surgically absent. 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 Bilateral hypogastric arteries are patent. The left external iliac artery is widely patent. There is a high-grade stenosis, greater than 80% within the proximal right external iliac artery. This was confirmed with pullback pressures. There was a 30 mm gradient. The remaining portion of the right external iliac artery is widely patent.  Right Lower  Extremity: The right common femoral artery is patent throughout it's course. The profunda femoral artery is widely patent. The right superficial femoral artery occludes just beyond its origin. There is reconstitution of the above-knee popliteal artery down near the patella. There is two-vessel runoff via the peroneal and posterior tibial artery.  Left Lower Extremity: None evaluated today  Intervention: After the above images were acquired, the decision was made to proceed with intervention. Over a 035 Rosen wire, a 6 [place] Ansel 1 sheath was advanced into the right external iliac artery. The patient was given systemic heparinization. 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 I selected a 6 x 40 chocolate balloon and perform balloon angioplasty at 10 atmospheres for 2 minutes. Completion imaging studies revealed resolution of the  stenosis with no evidence of dissection. Catheters and wires were removed. I closed the right groin with a pro-glide device. The cannulation site was hemostatic. There were no immediate complications.  Impression:  #1 high-grade, greater than 80% proximal right external iliac artery stenosis, successfully treated using a 6 x 40 chocolate balloon  #2 right superficial femoral artery occlusion with reconstitution of the above-knee popliteal artery at the level of the patella  #3 two-vessel runoff via the peroneal and posterior tibial artery  V. Annamarie Major, M.D.  [facility]  Office: [phone]  Pager: [phone]   Surgical History:  Past Surgical History  Procedure Laterality Date  . Coronary artery bypass graft      x 5  . Cardiac catheterization    . Nephrectomy      left nephrectomy for ca  . Posterior cervical laminectomy      x 8   limited ROM  and can't lie flat  . Heart stents      x 5  . Robot assisted laparoscopic radical prostatectomy      for prostate cancer  . Cystoscopy with litholapaxy  05/01/2012    Procedure: CYSTOSCOPY WITH LITHOLAPAXY;  Surgeon: Dutch Gray, MD;  Location: [facility];  Service: Urology;  Laterality: N/A;  . Prostate cancer    . Kidney surgery Left 1994     Home Meds: Prior to Admission medications   Medication Sig Start Date End Date Taking? Authorizing Provider  albuterol (PROVENTIL HFA;VENTOLIN HFA) 108 (90 BASE) MCG/ACT inhaler Inhale 2 puffs into the lungs every 6 (six) hours as needed for wheezing or shortness of breath. Shortness of breath 11/02/13  Yes Domenic Polite, MD  albuterol (PROVENTIL) (2.5 MG/3ML) 0.083% nebulizer solution Take 2.5 mg by nebulization 4 (four) times daily. 04/06/13  Yes Erick Colace, NP  ALPRAZolam Duanne Moron) 1 MG tablet Take 1 tablet (1 mg total) by mouth 3 (three) times daily as needed for anxiety. 11/09/13  Yes Theodis Blaze, MD  apixaban (ELIQUIS) 5 MG TABS tablet Take 1 tablet (5 mg  total) by mouth 2 (two) times daily. 11/09/13  Yes Theodis Blaze, MD  aspirin EC 81 MG tablet Take 81 mg by mouth daily.   Yes Historical Provider, MD  clotrimazole (MYCELEX) 10 MG troche Take 1 tablet (10 mg total) by mouth 5 (five) times daily. 01/29/14  Yes Tammy S Parrett, NP  doxazosin (CARDURA) 8 MG tablet Take 8 mg by mouth at bedtime.    Yes Historical Provider, MD  furosemide (LASIX) 40 MG tablet Take 0.5 tablets (20 mg total) by mouth 2 (two) times daily. Resume in 2 days. 01/11/14  Yes Eugenie Filler, MD  HYDROcodone-acetaminophen (NORCO) 10-325 MG per tablet Take 1 tablet by mouth 5 (five) times daily.   Yes Historical Provider, MD  HYDROmorphone (DILAUDID) 4 MG tablet Take 4 mg by mouth at bedtime.  11/09/13  Yes Theodis Blaze, MD  ipratropium (ATROVENT) 0.02 % nebulizer solution Take 0.5 mg by nebulization 4 (four) times daily.   Yes Historical Provider, MD  Ipratropium-Albuterol (COMBIVENT RESPIMAT) 20-100 MCG/ACT AERS respimat Inhale 1 puff into the lungs every 6 (six) hours as needed for wheezing or shortness of breath. 11/02/13  Yes Domenic Polite, MD  mometasone-formoterol (DULERA) 100-5 MCG/ACT AERO Inhale 2 puffs into the lungs 2 (two) times daily. 11/02/13  Yes Domenic Polite, MD  NITROSTAT 0.3 MG SL tablet Place 0.3 mg under the tongue every 5 (five) minutes as needed for chest pain.  06/15/13  Yes Historical Provider, MD  NON FORMULARY Place 2 L into the nose at bedtime. O2 at night only   Yes Historical Provider, MD  predniSONE (DELTASONE) 10 MG tablet 4 tabs for 2 days, then 3 tabs for 2 days, 2 tabs for 2 days, then 1 tab for 2 days, then stop 01/29/14  Yes Tammy S Parrett, NP  valsartan (DIOVAN) 40 MG tablet Take 40 mg by mouth daily.  11/09/13  Yes Historical Provider, MD    Inpatient Medications:  . antiseptic oral rinse  15 mL Mouth Rinse q12n4p  . apixaban  5 mg Oral BID  . aspirin EC  81 mg Oral Daily  . azithromycin  500 mg Intravenous QHS  . chlorhexidine  15 mL Mouth  Rinse BID  . clotrimazole  10 mg Oral 5 X Daily  . doxazosin  8 mg Oral QHS  . furosemide  20 mg Intravenous Q12H  . HYDROcodone-acetaminophen  1 tablet Oral 5 X Daily  . HYDROmorphone  4 mg Oral QHS  . ipratropium-albuterol  3 mL Nebulization Q4H  . methylPREDNISolone (SOLU-MEDROL) injection  60 mg Intravenous Q6H  . mometasone-formoterol  2 puff Inhalation BID  . sodium chloride  3 mL Intravenous Q12H      Allergies:  Allergies  Allergen Reactions  . Ativan [Lorazepam] Other (See Comments)    Increases agitation, tolerates xanax  . Lisinopril Cough  . Morphine And Related     Hallucinations, too sedated when given with ativan  . Ibuprofen Hives, Nausea And Vomiting and Rash    Tolerates baby aspirin per patient.      History   Social History  . Marital Status: Widowed    Spouse Name: N/A    Number of Children: N/A  . Years of Education: N/A   Occupational History  . disabled    Social History Main Topics  . Smoking status: Current Some Day Smoker -- 0.30 packs/day for 54 years    Types: Cigarettes    Last Attempt to Quit: 03/29/2013  . Smokeless tobacco: Former Systems developer    Quit date: 04/19/2012     Comment: Using e-cig now  . Alcohol Use: No  . Drug Use: No  . Sexual Activity: Not Currently   Other Topics Concern  . Not on file   Social History Narrative    He lives in [place] by himself.  He is a widower.    He continues to smoke about 4 or 5  cigarettes a day but  has a greater than 100 pack-year history having  previously smoked up to 3-4 packs a day over the span about 48 years.     He denies alcohol or drug use.  He is retired secondary to disability     since the 46s.      Family History  Problem Relation Age of Onset  . Diabetes Mother   . Heart disease Mother   . Hypertension Mother   . Heart attack Mother   . Coronary artery disease    . Heart disease Father   . Diabetes Father   . Heart attack Father   . Heart disease Brother   . Heart  attack Brother   . COPD Sister     Review of Systems  Constitutional: Positive for malaise/fatigue and diaphoresis.  Respiratory: Positive for cough, sputum production, shortness of breath and wheezing.   Cardiovascular: Positive for chest pain, orthopnea, claudication, leg swelling and PND.  Gastrointestinal: Positive for nausea and abdominal pain. Negative for diarrhea, constipation and blood in stool.  Genitourinary: Negative for hematuria.  Neurological: Positive for weakness.  Endo/Heme/Allergies: Bruises/bleeds easily.  Psychiatric/Behavioral: Positive for depression. The patient is nervous/anxious.   All other systems reviewed and are negative.  Labs:  Recent Labs  01/31/14 1959 01/31/14 2234 02/01/14 0403 02/01/14 1019  TROPONINI <0.30 <0.30 <0.30 <0.30   Lab Results  Component Value Date   WBC 7.5 02/01/2014   HGB 12.7* 02/01/2014   HCT 40.2 02/01/2014   MCV 93.7 02/01/2014   PLT 191 02/01/2014     Recent Labs Lab 02/01/14 0403  NA 138  K 4.5  CL 97  CO2 23  BUN 35*  CREATININE 1.60*  CALCIUM 9.4  GLUCOSE 212*   Lab Results  Component Value Date   CHOL 208* 07/01/2013   HDL 34* 07/01/2013   LDLCALC 145* 07/01/2013   TRIG 144 07/01/2013   Radiology/Studies:  Dg Chest 2 View 01/21/2014   CLINICAL DATA:  Shortness of breath and chest pain  EXAM: CHEST  2 VIEW  COMPARISON:  Chest radiograph and chest CT January 07, 2014  FINDINGS: There is underlying emphysematous change. There is no appreciable edema or consolidation. The previously noted interstitial prominence is stable. There is no new opacity. No airspace consolidation. No adenopathy. Heart is upper normal in size with pulmonary vascularity within normal limits. Patient is status post coronary artery bypass grafting. There is degenerative change in the thoracic spine. There is evidence of old trauma involving the lateral left clavicle.  IMPRESSION: Underlying emphysema with diffuse interstitial prominence which  is stable. There is no frank edema or consolidation. The small nodular opacities seen on the recent chest CT are not well delineated on radiographic examination. There is no airspace consolidation.   Electronically Signed   By: Lowella Grip M.D.   On: 01/21/2014 18:52   Ct Angio Chest Pe W/cm &/or Wo Cm 01/07/2014   CLINICAL DATA:  Status post angioplasty 12/26/2013. Chest pain and shortness of breath.  EXAM: CT ANGIOGRAPHY CHEST WITH CONTRAST  TECHNIQUE: Multidetector CT imaging of the chest was performed using the standard protocol during bolus administration of intravenous contrast. Multiplanar CT image reconstructions and MIPs were obtained to evaluate the vascular anatomy.  CONTRAST:  164mL OMNIPAQUE IOHEXOL 350 MG/ML SOLN  COMPARISON:  None.  FINDINGS: THORACIC INLET/BODY WALL:  Symmetric gynecomastia.  MEDIASTINUM:  Mild cardiomegaly. Diffuse coronary artery atherosclerosis, status post CABG. Cardiac motion limits assessment of graft patency, especially of the venous grafts. There  is no pericardial effusion. No aortic aneurysm or dissection. There is notable noncalcified plaque or thrombus along the posterior mid descending thoracic aorta. Motion and bolus dispersion decreases sensitivity in evaluating the pulmonary arteries beyond the proximal segmental level. There is no evidence of pulmonary embolism.  LUNG WINDOWS:  Moderate centrilobular emphysema. There are small scattered smooth pulmonary nodules, most peripheral:  Right lower lobe, image 43, 6 mm.  Right lower lobe, image 40, 4 mm.  Right middle lobe, image 46, 2 nodules measuring 5 and 6 mm.  No consolidation, effusion, or pneumothorax. There is diffuse bronchial wall thickening and mucoid impaction, likely also smoking-related.  UPPER ABDOMEN:  Partially imaged changes of left nephrectomy.  OSSEOUS:  A lower cervical corpectomy and strut graft is noted.  Review of the MIP images confirms the above findings.  IMPRESSION: 1. Negative for  pulmonary embolism to the proximal segmental level. Negative for aortic dissection. 2. COPD. 3. Small pulmonary nodules, measuring up to 6 mm in the right middle and lower lobes. See recommendations below.  RECOMMENDATIONS: Given emphysema, follow-up chest CT at 6 - 12 months is recommended. This recommendation follows the consensus statement: Guidelines for Management of Small Pulmonary Nodules Detected on CT Scans: A Statement from the [place] as published in Radiology 2005;237:395-400.   Electronically Signed   By: Jorje Guild M.D.   On: 01/07/2014 02:30   Dg Chest Port 1 View 01/31/2014   CLINICAL DATA:  Shortness of breath.  EXAM: PORTABLE CHEST - 1 VIEW  COMPARISON:  CT chest 01/07/2014. PA and lateral chest 01/21/2014. Single view of the chest 01/07/2014.  FINDINGS: The patient is status post CABG. Lung volumes are low with basilar atelectasis. Heart size is normal. No pneumothorax or pleural effusion.  IMPRESSION: No acute finding in a low volume chest.   Electronically Signed   By: Inge Rise M.D.   On: 01/31/2014 17:40   Dg Chest Port 1 View 01/07/2014   CLINICAL DATA:  Chest pain, prior myocardial infarction.  EXAM: PORTABLE CHEST - 1 VIEW  COMPARISON:  Chest radiograph Nov 14, 2013  FINDINGS: Cardiac silhouette remains mildly enlarged, status post median sternotomy for CABG. Mild chronic interstitial changes, similar without pleural effusions or focal consolidations. No pneumothorax.  Multiple EKG lines overlie the patient and may obscure subtle underlying pathology. Soft tissue planes and included osseous structures are nonsuspicious.  IMPRESSION: Stable cardiomegaly and chronic interstitial changes without superimposed acute pulmonary process.   Electronically Signed   By: Elon Alas   On: 01/07/2014 00:55   EKG:  7/23: sinus tach 116bpm old anteroseptal infarct, PVC noted, nonspecific ST-T changes, grossly similar to prior tracing 7/24: sinus bradycardia 56bpm old  anterospetal infarct, nonspecific ST-T changes, grossly similar to prior tracing  Physical Exam: Blood pressure 147/89, pulse 70, temperature 97.4 F (36.3 C), temperature source Oral, resp. rate 18, height 5\' 8"  (1.727 m), weight 210 lb 8.6 oz (95.5 kg), SpO2 90.00%. General: Well developed, well nourished, in no acute distress, but depressed in mood.. Exogenous obesity Head: Normocephalic, atraumatic, sclera non-icteric, no xanthomas, nares are without discharge.  Neck: Negative for carotid bruits. JVD 1-2 cm above the sternal notch but with HJR.. Lungs: Diffuse rhonchi with wheezing and accessory muscle use. He is in a tripod position. He does have increased work or breathing the Heart: RRR with S1 S2. Soft for gallop No murmurs, rubs appreciated. Abdomen: Very protuberant, firm abdomen that is nontender with no rebound or guarding. Normal active bowel sounds.  Msk:  Strength and tone appear normal for age. Extremities: Diffuse PAD dermatitis with trace to 1+ motion edema.  Distal pedal pulses are 1+ and equal bilaterally. Neuro: Alert and oriented X 3. No facial asymmetry. No focal deficit. Moves all extremities spontaneously. Psych:  Responds to questions appropriately with a normal affect.    Assessment and Plan:   1. Acute on chronic respiratory failure, likely multifactorial 2. Recurrent COPD exacerbation with acute bronchitis 3. Acute on chronic systolic CHF 4. CAD s/p CABGx5, last cath 12/2013 with stable anatomy (2/5 grafts patent), for med rx 5. 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 HTN, moderately elevated 7. Pulm nodules by CT 01/07/14, repeat planned by pulm in 6 months 8. PAF, on Eliquis  Pt seen/examined by physician - see comments below.  Signed, Melina Copa PA-C 02/01/2014, 11:37 AM  I have seen and evaluated the patient this evening along with Dayna Dunn PA-C. I agree with her findings, examination as well as impression recommendations.  Again this is a  very difficult situation with combined COPD and CHF. He feels a little bit better after IV diuresis, however his creatinine level has increased after just mild diuresis. His dyspnea is clearly multifactorial and continued therapy for CHF and COPD/pneumonia is warranted. With the bump in creatinine today I think holding off on diuretic for the morning tomorrow to to reevaluation of the creatinine is worth it.  His chest pain is probably not anginal in nature from a macrovascular standpoint based on the chronicity of it and how frequent he has exacerbations without doing any kind of exertion. I don't that he needs any further ischemic evaluation with stress test or catheterization.  For now, I would continue with IV diuretics but allow for reassessment in the morning. Would add a long-acting nitrate for potential anginal relief if not a macrovascular disease, for microvascular disease. Her neck so also be a good option, but would need to ensure that he has adequate coverage from his insurance.  If his blood pressure were tolerated, would consider the addition of amlodipine at the doses for possible antispasm effect.  Continue to treat COPD/ PNA per [facility] No A. fib currently, continue anticoagulation with Eliquis.  He's had multiple admissions for this combination of COPD and CHF over the last several months, I would not shoot for rapid discharge without clear-cut objectives of symptom control achieved.  Leonie Man, M.D., M.S. Interventional Cardiologist   Pager # [phone] 02/01/2014

## 2014-02-01 NOTE — Progress Notes (Signed)
Pt frequently sighing and moaning. RN heard patient tell lab and RT that he was in pain, but the doctor isn't letting him have the IV medicine "like the nice doctor in the ER" because it works faster. RN offered more morphine which patient refused. Pt asked for pain pill again and was reminded it was too soon. Pt rolled eyes at RN's statement. Will continue to monitor.

## 2014-02-01 NOTE — Progress Notes (Signed)
[facility] PROGRESS NOTE  Lucas [name] QGB:[id number] DOB: 10-10-1945 DOA: 01/31/2014 PCP: Salena Saner., MD  Assessment/Plan: 1-Acute on chronic hypoxic respiratory failure; could be multifactorial, COPD exacerbation , Systolic HF exacerbation.  Weight 7-21 at 96--- 7-23 at 95 . Unclear dry weight.  I will start IV lasix 20 mg BID, will consult cardiology. Will need to monitor Cr which has increase today at 1.6.  Continue with Solumedrol IV , azithromycin, nebulizer treatments every 4 hour.   2-Systolic Heart Failure; I will start IV lasix. Will consult cardiology for further evaluation. Will ask to cardio to adjust lasix as needed. BNP elevated.   3-Chest pain; troponin times 3 negative. Patient on eliquis doubt PE. Could be muscle skeletal. Cardio consulted.   4-Acute on CKD stage III; Cr increase to 1.6, unclear if related to hypoperfusion. Monitor on IV lasix. Will hold Diovan.   5-Chronic pain: he takes hydrocodone. He feels need relieved from pain. Will change morphine to IV dilaudid.   Code Status: DNR Family Communication: acre discussed with patient.  Disposition Plan: remain inpatient.    Consultants:  Cardiology  Procedures:  none  Antibiotics:  Azithromycin 7-23  HPI/Subjective: He is complaining of chronic legs pain, he has to follow up with vascular next Monday.  He is also complaining of SOB, he gain 6 pounds last week. He relates chest pain, cramping after he cough.   Objective: Filed Vitals:   02/01/14 0623  BP: 147/89  Pulse: 70  Temp: 97.4 F (36.3 C)  Resp: 18    Intake/Output Summary (Last 24 hours) at 02/01/14 0858 Last data filed at 02/01/14 0751  Gross per 24 hour  Intake    730 ml  Output    500 ml  Net    230 ml   Filed Weights   01/31/14 2300  Weight: 95.5 kg (210 lb 8.6 oz)    Exam:   General:  Speaking full sentences, no distress.   Cardiovascular: S 1, S 2 RRR  Respiratory: decrease breath sound,  bilateral crackles, sporadic wheezing,  Abdomen: obese, distended, nT  Musculoskeletal: trace edema.   Data Reviewed: Basic Metabolic Panel:  Recent Labs Lab 01/29/14 0945 01/31/14 1724 02/01/14 0403  NA 140 141 138  K 3.8 4.1 4.5  CL 103 101 97  CO2 30 24 23   GLUCOSE 104* 128* 212*  BUN 28* 29* 35*  CREATININE 1.3 1.33 1.60*  CALCIUM 9.7 10.0 9.4   Liver Function Tests: No results found for this basename: AST, ALT, ALKPHOS, BILITOT, PROT, ALBUMIN,  in the last 168 hours No results found for this basename: LIPASE, AMYLASE,  in the last 168 hours No results found for this basename: AMMONIA,  in the last 168 hours CBC:  Recent Labs Lab 01/31/14 1724 02/01/14 0403  WBC 7.9 7.5  HGB 13.0 12.7*  HCT 39.3 40.2  MCV 92.5 93.7  PLT 211 191   Cardiac Enzymes:  Recent Labs Lab 01/31/14 1959 01/31/14 2234 02/01/14 0403  TROPONINI <0.30 <0.30 <0.30   BNP (last 3 results)  Recent Labs  01/07/14 0005 01/29/14 0945 01/31/14 1724  PROBNP 1118.0* 203.0* 1616.0*   CBG: No results found for this basename: GLUCAP,  in the last 168 hours  No results found for this or any previous visit (from the past 240 hour(s)).   Studies: Dg Chest Port 1 View  01/31/2014   CLINICAL DATA:  Shortness of breath.  EXAM: PORTABLE CHEST - 1 VIEW  COMPARISON:  CT chest 01/07/2014.  PA and lateral chest 01/21/2014. Single view of the chest 01/07/2014.  FINDINGS: The patient is status post CABG. Lung volumes are low with basilar atelectasis. Heart size is normal. No pneumothorax or pleural effusion.  IMPRESSION: No acute finding in a low volume chest.   Electronically Signed   By: Inge Rise M.D.   On: 01/31/2014 17:40    Scheduled Meds: . antiseptic oral rinse  15 mL Mouth Rinse q12n4p  . apixaban  5 mg Oral BID  . aspirin EC  81 mg Oral Daily  . azithromycin  500 mg Intravenous QHS  . chlorhexidine  15 mL Mouth Rinse BID  . clotrimazole  10 mg Oral 5 X Daily  . doxazosin  8 mg  Oral QHS  . furosemide  20 mg Intravenous Q12H  . HYDROcodone-acetaminophen  1 tablet Oral 5 X Daily  . HYDROmorphone  4 mg Oral QHS  . ipratropium-albuterol  3 mL Nebulization Q4H  . methylPREDNISolone (SOLU-MEDROL) injection  60 mg Intravenous Q6H  . mometasone-formoterol  2 puff Inhalation BID  . sodium chloride  3 mL Intravenous Q12H   Continuous Infusions:   Principal Problem:   COPD with exacerbation Active Problems:   COPD exacerbation   A-fib   Acute on chronic diastolic CHF (congestive heart failure)   Chest pain   Acute bronchitis   Acute on chronic respiratory failure    Time spent: 35 minutes.     Niel Hummer A  [facility] Pager [phone]. If 7PM-7AM, please contact night-coverage at [email or web address], password [facility] 02/01/2014, 8:58 AM  LOS: 1 day

## 2014-02-01 NOTE — Progress Notes (Signed)
Pt asked for pain medicine. This RN brought patient morphine and after receiving it, patient said he doesn't want any more because he knows that in the past morphine caused him "to check out of the hospital" Pt then asked for more of his dilaudid pills and norco. RN explained to patient that those medications were scheduled for 6 AM. He said his pain was here now and he wanted the pills now. RN explained that she could not give the pills until the earliest being 5AM. Pt became upset stating that home he takes the pills how ever often he needs them because that's how pain meds should work and that he believes we should give him IV dilaudid like the nice doctor in the ER and that his current doctor doesn't care about his pain and he would yell at her when he saw her in the morning. Will continue to monitor.

## 2014-02-02 LAB — BASIC METABOLIC PANEL
Anion gap: 13 (ref 5–15)
BUN: 50 mg/dL — ABNORMAL HIGH (ref 6–23)
CALCIUM: 9.4 mg/dL (ref 8.4–10.5)
CO2: 28 mEq/L (ref 19–32)
Chloride: 96 mEq/L (ref 96–112)
Creatinine, Ser: 1.72 mg/dL — ABNORMAL HIGH (ref 0.50–1.35)
GFR calc non Af Amer: 39 mL/min — ABNORMAL LOW (ref >90)
GFR, EST AFRICAN AMERICAN: 45 mL/min — AB (ref >90)
Glucose, Bld: 133 mg/dL — ABNORMAL HIGH (ref 70–99)
POTASSIUM: 4.6 meq/L (ref 3.7–5.3)
SODIUM: 137 meq/L (ref 137–147)

## 2014-02-02 LAB — CBC
HCT: 39.4 % (ref 39.0–52.0)
Hemoglobin: 12.5 g/dL — ABNORMAL LOW (ref 13.0–17.0)
MCH: 29.5 pg (ref 26.0–34.0)
MCHC: 31.7 g/dL (ref 30.0–36.0)
MCV: 92.9 fL (ref 78.0–100.0)
Platelets: 218 10*3/uL (ref 150–400)
RBC: 4.24 MIL/uL (ref 4.22–5.81)
RDW: 15.9 % — AB (ref 11.5–15.5)
WBC: 14.4 10*3/uL — ABNORMAL HIGH (ref 4.0–10.5)

## 2014-02-02 MED ORDER — GUAIFENESIN ER 600 MG PO TB12
600.0000 mg | ORAL_TABLET | Freq: Two times a day (BID) | ORAL | Status: DC
  Administered 2014-02-02 – 2014-02-04 (×5): 600 mg via ORAL
  Filled 2014-02-02 (×8): qty 1

## 2014-02-02 NOTE — Progress Notes (Signed)
Received report from [facility] and agree with asessment.

## 2014-02-02 NOTE — Progress Notes (Signed)
Patient Name: Lucas [name] Date of Encounter: 02/02/2014      Active Problems:   Acute exacerbation of chronic obstructive pulmonary disease (COPD)   Acute on chronic systolic CHF (congestive heart failure)   Chronic chest pain   Acute bronchitis   Acute on chronic respiratory failure   Chronic pain   PAF (paroxysmal atrial fibrillation)   CKD (chronic kidney disease), stage III   Renal cancer   Chronic systolic CHF (congestive heart failure)   CAD in native artery   COPD exacerbation    SUBJECTIVE  The patient continues to complain of chest pain, dyspnea and leg pain. He has a history of chronic pain syndrome and is on dilaudid at home. Telemetry shows NSR.  CURRENT MEDS . antiseptic oral rinse  15 mL Mouth Rinse q12n4p  . apixaban  5 mg Oral BID  . aspirin EC  81 mg Oral Daily  . azithromycin  500 mg Intravenous QHS  . chlorhexidine  15 mL Mouth Rinse BID  . clotrimazole  10 mg Oral 5 X Daily  . doxazosin  8 mg Oral QHS  . furosemide  20 mg Intravenous Q12H  . HYDROcodone-acetaminophen  1 tablet Oral 5 X Daily  . HYDROmorphone  4 mg Oral QHS  . ipratropium-albuterol  3 mL Nebulization QID  . methylPREDNISolone (SOLU-MEDROL) injection  60 mg Intravenous Q6H  . mometasone-formoterol  2 puff Inhalation BID  . nicotine  14 mg Transdermal Daily  . sodium chloride  3 mL Intravenous Q12H    OBJECTIVE  Filed Vitals:   02/01/14 2014 02/01/14 2154 02/02/14 0509 02/02/14 0813  BP:  137/73 140/81   Pulse: 77 80 70   Temp:  97.9 F (36.6 C) 97.6 F (36.4 C)   TempSrc:  Oral Oral   Resp: 20 18 18    Height:      Weight:   213 lb 14.4 oz (97.024 kg)   SpO2: 95% 94% 94% 87%    Intake/Output Summary (Last 24 hours) at 02/02/14 0957 Last data filed at 02/02/14 0850  Gross per 24 hour  Intake   2690 ml  Output   2000 ml  Net    690 ml   Filed Weights   01/31/14 2300 02/02/14 0509  Weight: 210 lb 8.6 oz (95.5 kg) 213 lb 14.4 oz (97.024 kg)    PHYSICAL  EXAM  General: Pleasant, NAD.  Nasal oxygen in place. Neuro: Alert and oriented X 3. Moves all extremities spontaneously. Psych: Normal affect.  Depressed, complains of ongoing leg pain HEENT:  Normal  Neck: Supple without bruits or JVD. Lungs:  Lung sounds are distant with mild diffuse expiratory wheezes Heart: RRR no s3, s4, or murmurs. Abdomen: Soft, non-tender, non-distended, BS + x 4.  Extremities: Trace pretibial and pedal edema.  Very weak pedal pulses  Accessory Clinical Findings  CBC  Recent Labs  02/01/14 0403 02/02/14 0510  WBC 7.5 14.4*  HGB 12.7* 12.5*  HCT 40.2 39.4  MCV 93.7 92.9  PLT 191 725   Basic Metabolic Panel  Recent Labs  02/01/14 0403 02/02/14 0510  NA 138 137  K 4.5 4.6  CL 97 96  CO2 23 28  GLUCOSE 212* 133*  BUN 35* 50*  CREATININE 1.60* 1.72*  CALCIUM 9.4 9.4   Liver Function Tests No results found for this basename: AST, ALT, ALKPHOS, BILITOT, PROT, ALBUMIN,  in the last 72 hours No results found for this basename: LIPASE, AMYLASE,  in the last 72  hours Cardiac Enzymes  Recent Labs  01/31/14 2234 02/01/14 0403 02/01/14 1019  TROPONINI <0.30 <0.30 <0.30   BNP No components found with this basename: POCBNP,  D-Dimer No results found for this basename: DDIMER,  in the last 72 hours Hemoglobin A1C No results found for this basename: HGBA1C,  in the last 72 hours Fasting Lipid Panel No results found for this basename: CHOL, HDL, LDLCALC, TRIG, CHOLHDL, LDLDIRECT,  in the last 72 hours Thyroid Function Tests No results found for this basename: TSH, T4TOTAL, FREET3, T3FREE, THYROIDAB,  in the last 72 hours  TELE  Normal sinus rhythm.  Occasional PVCs.  ECG    Radiology/Studies  Dg Chest 2 View  01/21/2014   CLINICAL DATA:  Shortness of breath and chest pain  EXAM: CHEST  2 VIEW  COMPARISON:  Chest radiograph and chest CT January 07, 2014  FINDINGS: There is underlying emphysematous change. There is no appreciable edema or  consolidation. The previously noted interstitial prominence is stable. There is no new opacity. No airspace consolidation. No adenopathy. Heart is upper normal in size with pulmonary vascularity within normal limits. Patient is status post coronary artery bypass grafting. There is degenerative change in the thoracic spine. There is evidence of old trauma involving the lateral left clavicle.  IMPRESSION: Underlying emphysema with diffuse interstitial prominence which is stable. There is no frank edema or consolidation. The small nodular opacities seen on the recent chest CT are not well delineated on radiographic examination. There is no airspace consolidation.   Electronically Signed   By: Lowella Grip M.D.   On: 01/21/2014 18:52   Ct Angio Chest Pe W/cm &/or Wo Cm  01/07/2014   CLINICAL DATA:  Status post angioplasty 12/26/2013. Chest pain and shortness of breath.  EXAM: CT ANGIOGRAPHY CHEST WITH CONTRAST  TECHNIQUE: Multidetector CT imaging of the chest was performed using the standard protocol during bolus administration of intravenous contrast. Multiplanar CT image reconstructions and MIPs were obtained to evaluate the vascular anatomy.  CONTRAST:  152mL OMNIPAQUE IOHEXOL 350 MG/ML SOLN  COMPARISON:  None.  FINDINGS: THORACIC INLET/BODY WALL:  Symmetric gynecomastia.  MEDIASTINUM:  Mild cardiomegaly. Diffuse coronary artery atherosclerosis, status post CABG. Cardiac motion limits assessment of graft patency, especially of the venous grafts. There is no pericardial effusion. No aortic aneurysm or dissection. There is notable noncalcified plaque or thrombus along the posterior mid descending thoracic aorta. Motion and bolus dispersion decreases sensitivity in evaluating the pulmonary arteries beyond the proximal segmental level. There is no evidence of pulmonary embolism.  LUNG WINDOWS:  Moderate centrilobular emphysema. There are small scattered smooth pulmonary nodules, most peripheral:  Right lower  lobe, image 43, 6 mm.  Right lower lobe, image 40, 4 mm.  Right middle lobe, image 46, 2 nodules measuring 5 and 6 mm.  No consolidation, effusion, or pneumothorax. There is diffuse bronchial wall thickening and mucoid impaction, likely also smoking-related.  UPPER ABDOMEN:  Partially imaged changes of left nephrectomy.  OSSEOUS:  A lower cervical corpectomy and strut graft is noted.  Review of the MIP images confirms the above findings.  IMPRESSION: 1. Negative for pulmonary embolism to the proximal segmental level. Negative for aortic dissection. 2. COPD. 3. Small pulmonary nodules, measuring up to 6 mm in the right middle and lower lobes. See recommendations below.  RECOMMENDATIONS: Given emphysema, follow-up chest CT at 6 - 12 months is recommended. This recommendation follows the consensus statement: Guidelines for Management of Small Pulmonary Nodules Detected on CT Scans: A  Statement from the [employer] as published in Radiology 2005;237:395-400.   Electronically Signed   By: Jorje Guild M.D.   On: 01/07/2014 02:30   Dg Chest Port 1 View  01/31/2014   CLINICAL DATA:  Shortness of breath.  EXAM: PORTABLE CHEST - 1 VIEW  COMPARISON:  CT chest 01/07/2014. PA and lateral chest 01/21/2014. Single view of the chest 01/07/2014.  FINDINGS: The patient is status post CABG. Lung volumes are low with basilar atelectasis. Heart size is normal. No pneumothorax or pleural effusion.  IMPRESSION: No acute finding in a low volume chest.   Electronically Signed   By: Inge Rise M.D.   On: 01/31/2014 17:40   Dg Chest Port 1 View  01/07/2014   CLINICAL DATA:  Chest pain, prior myocardial infarction.  EXAM: PORTABLE CHEST - 1 VIEW  COMPARISON:  Chest radiograph Nov 14, 2013  FINDINGS: Cardiac silhouette remains mildly enlarged, status post median sternotomy for CABG. Mild chronic interstitial changes, similar without pleural effusions or focal consolidations. No pneumothorax.  Multiple EKG lines overlie the  patient and may obscure subtle underlying pathology. Soft tissue planes and included osseous structures are nonsuspicious.  IMPRESSION: Stable cardiomegaly and chronic interstitial changes without superimposed acute pulmonary process.   Electronically Signed   By: Elon Alas   On: 01/07/2014 00:55    ASSESSMENT AND PLAN  1. Acute on chronic respiratory failure, likely multifactorial  2. Recurrent COPD exacerbation with acute bronchitis  3. Acute on chronic systolic CHF  4. CAD s/p CABGx5, last cath 12/2013 with stable anatomy (2/5 grafts patent), for med rx  5. CKD stage III in setting of prior nephrectomy  6. HTN, moderately elevated  7. Pulm nodules by CT 01/07/14, repeat planned by pulm in 6 months  8. PAF, on Eliquis  Plan: His creatinine is up further after IV diuresis yesterday.  I think the main component of his dyspnea is his COPD and pulmonary disease.  We will stop his diuretic today and reevaluate creatinine tomorrow. He's had multiple admissions for this combination of COPD and CHF over the last several months, I would not shoot for rapid discharge without clear-cut objectives of symptom control achieved.  Signed, Darlin Coco MD

## 2014-02-02 NOTE — Progress Notes (Signed)
[facility] PROGRESS NOTE  Lucas [name] ION:[id number] DOB: 07-Jun-1946 DOA: 01/31/2014 PCP: Salena Saner., MD  Assessment/Plan: 1-Acute on chronic hypoxic respiratory failure; could be multifactorial, COPD exacerbation , Systolic HF exacerbation.  Lasix on hold due to increased in cr level.  Continue with Solumedrol IV , azithromycin, nebulizer treatments every 4 hour.   2-Systolic Heart Failure; cr increased to 1.7. Lasix on hold. Weight 95--97   3-Chest pain; troponin times 3 negative. Patient on eliquis doubt PE. Could be muscle skeletal. Cardio following.  Pain persist. Worse after coughing spell. Will start guaifenesin.   4-Acute on CKD stage III; Cr increase to 1.7.  Will hold Diovan. IV lasix on hold.   5-Chronic pain: He takes hydrocodone. Continue with  IV dilaudid PRN.   6-PVD; S/P angioplasty of right external iliac artery. Complaining of persistent pain, getting worse. Will inform vascular.   Code Status: DNR Family Communication: Care  discussed with patient.  Disposition Plan: remain inpatient.    Consultants:  Cardiology  Procedures:  none  Antibiotics:  Azithromycin 7-23  HPI/Subjective: Still with SOB, chest pain after coughing. IV pain medication helps.  He is complaining of right LE pain, pain progressively getting worse since angioplasty.   Objective: Filed Vitals:   02/02/14 0509  BP: 140/81  Pulse: 70  Temp: 97.6 F (36.4 C)  Resp: 18    Intake/Output Summary (Last 24 hours) at 02/02/14 1357 Last data filed at 02/02/14 0850  Gross per 24 hour  Intake   2210 ml  Output   1600 ml  Net    610 ml   Filed Weights   01/31/14 2300 02/02/14 0509  Weight: 95.5 kg (210 lb 8.6 oz) 97.024 kg (213 lb 14.4 oz)    Exam:   General:  Speaking full sentences, no distress.   Cardiovascular: S 1, S 2 RRR  Respiratory: decrease breath sound, bilateral crackles, sporadic wheezing,  Abdomen: obese, distended,  nT  Musculoskeletal: trace edema.   Data Reviewed: Basic Metabolic Panel:  Recent Labs Lab 01/29/14 0945 01/31/14 1724 02/01/14 0403 02/02/14 0510  NA 140 141 138 137  K 3.8 4.1 4.5 4.6  CL 103 101 97 96  CO2 30 24 23 28   GLUCOSE 104* 128* 212* 133*  BUN 28* 29* 35* 50*  CREATININE 1.3 1.33 1.60* 1.72*  CALCIUM 9.7 10.0 9.4 9.4   Liver Function Tests: No results found for this basename: AST, ALT, ALKPHOS, BILITOT, PROT, ALBUMIN,  in the last 168 hours No results found for this basename: LIPASE, AMYLASE,  in the last 168 hours No results found for this basename: AMMONIA,  in the last 168 hours CBC:  Recent Labs Lab 01/31/14 1724 02/01/14 0403 02/02/14 0510  WBC 7.9 7.5 14.4*  HGB 13.0 12.7* 12.5*  HCT 39.3 40.2 39.4  MCV 92.5 93.7 92.9  PLT 211 191 218   Cardiac Enzymes:  Recent Labs Lab 01/31/14 1959 01/31/14 2234 02/01/14 0403 02/01/14 1019  TROPONINI <0.30 <0.30 <0.30 <0.30   BNP (last 3 results)  Recent Labs  01/07/14 0005 01/29/14 0945 01/31/14 1724  PROBNP 1118.0* 203.0* 1616.0*   CBG: No results found for this basename: GLUCAP,  in the last 168 hours  No results found for this or any previous visit (from the past 240 hour(s)).   Studies: Dg Chest Port 1 View  01/31/2014   CLINICAL DATA:  Shortness of breath.  EXAM: PORTABLE CHEST - 1 VIEW  COMPARISON:  CT chest 01/07/2014. PA and lateral chest  01/21/2014. Single view of the chest 01/07/2014.  FINDINGS: The patient is status post CABG. Lung volumes are low with basilar atelectasis. Heart size is normal. No pneumothorax or pleural effusion.  IMPRESSION: No acute finding in a low volume chest.   Electronically Signed   By: Inge Rise M.D.   On: 01/31/2014 17:40    Scheduled Meds: . antiseptic oral rinse  15 mL Mouth Rinse q12n4p  . apixaban  5 mg Oral BID  . aspirin EC  81 mg Oral Daily  . azithromycin  500 mg Intravenous QHS  . chlorhexidine  15 mL Mouth Rinse BID  . clotrimazole  10  mg Oral 5 X Daily  . doxazosin  8 mg Oral QHS  . HYDROcodone-acetaminophen  1 tablet Oral 5 X Daily  . HYDROmorphone  4 mg Oral QHS  . ipratropium-albuterol  3 mL Nebulization QID  . methylPREDNISolone (SOLU-MEDROL) injection  60 mg Intravenous Q6H  . mometasone-formoterol  2 puff Inhalation BID  . nicotine  14 mg Transdermal Daily  . sodium chloride  3 mL Intravenous Q12H   Continuous Infusions:   Active Problems:   Acute exacerbation of chronic obstructive pulmonary disease (COPD)   Acute on chronic systolic CHF (congestive heart failure)   Chronic chest pain   Acute bronchitis   Acute on chronic respiratory failure   Chronic pain   PAF (paroxysmal atrial fibrillation)   CKD (chronic kidney disease), stage III   Renal cancer   Chronic systolic CHF (congestive heart failure)   CAD in native artery   COPD exacerbation    Time spent: 25 minutes.     Niel Hummer A  [facility] Pager [phone]. If 7PM-7AM, please contact night-coverage at [email or web address], password [facility] 02/02/2014, 1:57 PM  LOS: 2 days

## 2014-02-03 LAB — BASIC METABOLIC PANEL
Anion gap: 13 (ref 5–15)
BUN: 55 mg/dL — AB (ref 6–23)
CALCIUM: 9.4 mg/dL (ref 8.4–10.5)
CO2: 27 mEq/L (ref 19–32)
Chloride: 96 mEq/L (ref 96–112)
Creatinine, Ser: 1.59 mg/dL — ABNORMAL HIGH (ref 0.50–1.35)
GFR, EST AFRICAN AMERICAN: 50 mL/min — AB (ref >90)
GFR, EST NON AFRICAN AMERICAN: 43 mL/min — AB (ref >90)
Glucose, Bld: 139 mg/dL — ABNORMAL HIGH (ref 70–99)
POTASSIUM: 4.2 meq/L (ref 3.7–5.3)
Sodium: 136 mEq/L — ABNORMAL LOW (ref 137–147)

## 2014-02-03 MED ORDER — ISOSORBIDE MONONITRATE ER 30 MG PO TB24
30.0000 mg | ORAL_TABLET | Freq: Every day | ORAL | Status: DC
  Administered 2014-02-03: 30 mg via ORAL
  Filled 2014-02-03 (×3): qty 1

## 2014-02-03 MED ORDER — FUROSEMIDE 20 MG PO TABS
20.0000 mg | ORAL_TABLET | Freq: Two times a day (BID) | ORAL | Status: DC
  Administered 2014-02-03 – 2014-02-04 (×4): 20 mg via ORAL
  Filled 2014-02-03 (×7): qty 1

## 2014-02-03 MED ORDER — METHYLPREDNISOLONE SODIUM SUCC 125 MG IJ SOLR
60.0000 mg | Freq: Two times a day (BID) | INTRAMUSCULAR | Status: DC
  Administered 2014-02-03 – 2014-02-04 (×2): 60 mg via INTRAVENOUS
  Filled 2014-02-03 (×3): qty 0.96

## 2014-02-03 MED ORDER — HYDROMORPHONE HCL PF 1 MG/ML IJ SOLN
1.0000 mg | Freq: Once | INTRAMUSCULAR | Status: AC
  Administered 2014-02-03: 1 mg via INTRAVENOUS

## 2014-02-03 MED ORDER — HYDROMORPHONE HCL PF 2 MG/ML IJ SOLN
1.0000 mg | INTRAMUSCULAR | Status: DC | PRN
  Administered 2014-02-03 – 2014-02-04 (×11): 1.5 mg via INTRAVENOUS
  Administered 2014-02-05 (×3): 1 mg via INTRAVENOUS
  Filled 2014-02-03 (×14): qty 1

## 2014-02-03 MED ORDER — ISOSORBIDE MONONITRATE ER 30 MG PO TB24: 30.0000 mg | ORAL_TABLET | Freq: Every day | ORAL | Status: DC

## 2014-02-03 MED ORDER — BENZONATATE 100 MG PO CAPS
100.0000 mg | ORAL_CAPSULE | Freq: Three times a day (TID) | ORAL | Status: DC | PRN
  Administered 2014-02-03 (×3): 100 mg via ORAL
  Filled 2014-02-03 (×3): qty 1

## 2014-02-03 NOTE — Progress Notes (Signed)
Patient Name: Lucas [name] Date of Encounter: 02/03/2014     Active Problems:   Acute exacerbation of chronic obstructive pulmonary disease (COPD)   Acute on chronic systolic CHF (congestive heart failure)   Chronic chest pain   Acute bronchitis   Acute on chronic respiratory failure   Chronic pain   PAF (paroxysmal atrial fibrillation)   CKD (chronic kidney disease), stage III   Renal cancer   Chronic systolic CHF (congestive heart failure)   CAD in native artery   COPD exacerbation    SUBJECTIVE  Patient continues to complain of a lot of leg pain and some chest pain.  He is coughing up green sputum.  Chest pain is worse after coughing.  CURRENT MEDS . antiseptic oral rinse  15 mL Mouth Rinse q12n4p  . apixaban  5 mg Oral BID  . aspirin EC  81 mg Oral Daily  . azithromycin  500 mg Intravenous QHS  . chlorhexidine  15 mL Mouth Rinse BID  . clotrimazole  10 mg Oral 5 X Daily  . doxazosin  8 mg Oral QHS  . guaiFENesin  600 mg Oral BID  . HYDROcodone-acetaminophen  1 tablet Oral 5 X Daily  . HYDROmorphone  4 mg Oral QHS  . ipratropium-albuterol  3 mL Nebulization QID  . methylPREDNISolone (SOLU-MEDROL) injection  60 mg Intravenous Q6H  . mometasone-formoterol  2 puff Inhalation BID  . nicotine  14 mg Transdermal Daily  . sodium chloride  3 mL Intravenous Q12H    OBJECTIVE  Filed Vitals:   02/02/14 1613 02/02/14 2146 02/03/14 0417 02/03/14 0842  BP:  125/60 152/74   Pulse:  71 76   Temp:  97.9 F (36.6 C) 97.6 F (36.4 C)   TempSrc:  Oral Oral   Resp:   22   Height:      Weight:   214 lb 14.4 oz (97.478 kg)   SpO2: 86% 94% 95% 89%    Intake/Output Summary (Last 24 hours) at 02/03/14 0955 Last data filed at 02/03/14 0904  Gross per 24 hour  Intake   2290 ml  Output   2025 ml  Net    265 ml   Filed Weights   01/31/14 2300 02/02/14 0509 02/03/14 0417  Weight: 210 lb 8.6 oz (95.5 kg) 213 lb 14.4 oz (97.024 kg) 214 lb 14.4 oz (97.478 kg)    PHYSICAL  EXAM  General: Pleasant, NAD. Nasal oxygen in place.  Neuro: Alert and oriented X 3. Moves all extremities spontaneously.  Psych: Normal affect. Depressed, complains of ongoing leg pain  HEENT: Normal  Neck: Supple without bruits or JVD.  Lungs: Lung sounds are distant with mild diffuse expiratory wheezes  Heart: RRR no s3, s4, or murmurs.  Abdomen: Soft, non-tender, non-distended, BS + x 4.  Extremities: Trace pretibial and pedal edema. Very weak pedal pulses   Accessory Clinical Findings  CBC  Recent Labs  02/01/14 0403 02/02/14 0510  WBC 7.5 14.4*  HGB 12.7* 12.5*  HCT 40.2 39.4  MCV 93.7 92.9  PLT 191 599   Basic Metabolic Panel  Recent Labs  02/02/14 0510 02/03/14 0500  NA 137 136*  K 4.6 4.2  CL 96 96  CO2 28 27  GLUCOSE 133* 139*  BUN 50* 55*  CREATININE 1.72* 1.59*  CALCIUM 9.4 9.4   Liver Function Tests No results found for this basename: AST, ALT, ALKPHOS, BILITOT, PROT, ALBUMIN,  in the last 72 hours No results found for this  basename: LIPASE, AMYLASE,  in the last 72 hours Cardiac Enzymes  Recent Labs  01/31/14 2234 02/01/14 0403 02/01/14 1019  TROPONINI <0.30 <0.30 <0.30   BNP No components found with this basename: POCBNP,  D-Dimer No results found for this basename: DDIMER,  in the last 72 hours Hemoglobin A1C No results found for this basename: HGBA1C,  in the last 72 hours Fasting Lipid Panel No results found for this basename: CHOL, HDL, LDLCALC, TRIG, CHOLHDL, LDLDIRECT,  in the last 72 hours Thyroid Function Tests No results found for this basename: TSH, T4TOTAL, FREET3, T3FREE, THYROIDAB,  in the last 72 hours  TELE  Normal sinus rhythm.  Occasional PVC  ECG    Radiology/Studies  Dg Chest 2 View  01/21/2014   CLINICAL DATA:  Shortness of breath and chest pain  EXAM: CHEST  2 VIEW  COMPARISON:  Chest radiograph and chest CT January 07, 2014  FINDINGS: There is underlying emphysematous change. There is no appreciable edema or  consolidation. The previously noted interstitial prominence is stable. There is no new opacity. No airspace consolidation. No adenopathy. Heart is upper normal in size with pulmonary vascularity within normal limits. Patient is status post coronary artery bypass grafting. There is degenerative change in the thoracic spine. There is evidence of old trauma involving the lateral left clavicle.  IMPRESSION: Underlying emphysema with diffuse interstitial prominence which is stable. There is no frank edema or consolidation. The small nodular opacities seen on the recent chest CT are not well delineated on radiographic examination. There is no airspace consolidation.   Electronically Signed   By: Lowella Grip M.D.   On: 01/21/2014 18:52   Ct Angio Chest Pe W/cm &/or Wo Cm  01/07/2014   CLINICAL DATA:  Status post angioplasty 12/26/2013. Chest pain and shortness of breath.  EXAM: CT ANGIOGRAPHY CHEST WITH CONTRAST  TECHNIQUE: Multidetector CT imaging of the chest was performed using the standard protocol during bolus administration of intravenous contrast. Multiplanar CT image reconstructions and MIPs were obtained to evaluate the vascular anatomy.  CONTRAST:  127mL OMNIPAQUE IOHEXOL 350 MG/ML SOLN  COMPARISON:  None.  FINDINGS: THORACIC INLET/BODY WALL:  Symmetric gynecomastia.  MEDIASTINUM:  Mild cardiomegaly. Diffuse coronary artery atherosclerosis, status post CABG. Cardiac motion limits assessment of graft patency, especially of the venous grafts. There is no pericardial effusion. No aortic aneurysm or dissection. There is notable noncalcified plaque or thrombus along the posterior mid descending thoracic aorta. Motion and bolus dispersion decreases sensitivity in evaluating the pulmonary arteries beyond the proximal segmental level. There is no evidence of pulmonary embolism.  LUNG WINDOWS:  Moderate centrilobular emphysema. There are small scattered smooth pulmonary nodules, most peripheral:  Right lower  lobe, image 43, 6 mm.  Right lower lobe, image 40, 4 mm.  Right middle lobe, image 46, 2 nodules measuring 5 and 6 mm.  No consolidation, effusion, or pneumothorax. There is diffuse bronchial wall thickening and mucoid impaction, likely also smoking-related.  UPPER ABDOMEN:  Partially imaged changes of left nephrectomy.  OSSEOUS:  A lower cervical corpectomy and strut graft is noted.  Review of the MIP images confirms the above findings.  IMPRESSION: 1. Negative for pulmonary embolism to the proximal segmental level. Negative for aortic dissection. 2. COPD. 3. Small pulmonary nodules, measuring up to 6 mm in the right middle and lower lobes. See recommendations below.  RECOMMENDATIONS: Given emphysema, follow-up chest CT at 6 - 12 months is recommended. This recommendation follows the consensus statement: Guidelines for Management of  Small Pulmonary Nodules Detected on CT Scans: A Statement from the [employer] as published in Radiology 2005;237:395-400.   Electronically Signed   By: Jorje Guild M.D.   On: 01/07/2014 02:30   Dg Chest Port 1 View  01/31/2014   CLINICAL DATA:  Shortness of breath.  EXAM: PORTABLE CHEST - 1 VIEW  COMPARISON:  CT chest 01/07/2014. PA and lateral chest 01/21/2014. Single view of the chest 01/07/2014.  FINDINGS: The patient is status post CABG. Lung volumes are low with basilar atelectasis. Heart size is normal. No pneumothorax or pleural effusion.  IMPRESSION: No acute finding in a low volume chest.   Electronically Signed   By: Inge Rise M.D.   On: 01/31/2014 17:40   Dg Chest Port 1 View  01/07/2014   CLINICAL DATA:  Chest pain, prior myocardial infarction.  EXAM: PORTABLE CHEST - 1 VIEW  COMPARISON:  Chest radiograph Nov 14, 2013  FINDINGS: Cardiac silhouette remains mildly enlarged, status post median sternotomy for CABG. Mild chronic interstitial changes, similar without pleural effusions or focal consolidations. No pneumothorax.  Multiple EKG lines overlie the  patient and may obscure subtle underlying pathology. Soft tissue planes and included osseous structures are nonsuspicious.  IMPRESSION: Stable cardiomegaly and chronic interstitial changes without superimposed acute pulmonary process.   Electronically Signed   By: Elon Alas   On: 01/07/2014 00:55    ASSESSMENT AND PLAN  1. Acute on chronic respiratory failure, likely multifactorial  2. Recurrent COPD exacerbation with acute bronchitis  3. Acute on chronic systolic CHF  4. CAD s/p CABGx5, last cath 12/2013 with stable anatomy (2/5 grafts patent), for med rx  5. CKD stage III in setting of prior nephrectomy  6. HTN, moderately elevated  7. Pulm nodules by CT 01/07/14, repeat planned by pulm in 6 months  8. PAF, on Eliquis   Plan: His creatinine is improved and his weight is up.  I will restart his home dose of furosemide 20 mg twice a day by mouth. He's had multiple admissions for this combination of COPD and CHF over the last several months, I would not shoot for rapid discharge without clear-cut objectives of symptom control achieved.   Signed, Darlin Coco MD

## 2014-02-03 NOTE — Progress Notes (Signed)
[facility] PROGRESS NOTE  Lucas [name] GYK:[id number] DOB: 1946-02-23 DOA: 01/31/2014 PCP: Salena Saner., MD  Assessment/Plan: 1-Acute on chronic hypoxic respiratory failure; could be multifactorial, COPD exacerbation , Systolic HF exacerbation.  Lasix resume today .  Continue with , azithromycin, nebulizer treatments every 4 hour.  Will taper Solumedrol.   2-Systolic Heart Failure; Weight 95--97 --97. Cr has decrease to 1.5. Lasix resume.   3-Chest pain; troponin times 3 negative. Patient on eliquis doubt PE. Could be muscle skeletal. Cardio following.  Pain persist. Worse after coughing spell.  Started  guaifenesin. Will increase dilaudid.   4-Acute on CKD stage III; Cr increase to 1.7.  Will hold Diovan. Cr has decrease to 1.5. Monitor on oral lasix.   5-Chronic pain: He takes hydrocodone. Continue with  IV dilaudid PRN.   6-PVD; S/P angioplasty of right external iliac artery. Complaining of persistent pain, getting worse. Dr Nechama Guard will be inform of patient admission.   Code Status: DNR Family Communication: Care  discussed with patient.  Disposition Plan: remain inpatient.    Consultants:  Cardiology  Procedures:  none  Antibiotics:  Azithromycin 7-23  HPI/Subjective: Still complaining of right leg pain, chest pain after coughing. Wants oral dilaudid when he goes home.    Objective: Filed Vitals:   02/03/14 0417  BP: 152/74  Pulse: 76  Temp: 97.6 F (36.4 C)  Resp: 22    Intake/Output Summary (Last 24 hours) at 02/03/14 1205 Last data filed at 02/03/14 0904  Gross per 24 hour  Intake   2290 ml  Output   2025 ml  Net    265 ml   Filed Weights   01/31/14 2300 02/02/14 0509 02/03/14 0417  Weight: 95.5 kg (210 lb 8.6 oz) 97.024 kg (213 lb 14.4 oz) 97.478 kg (214 lb 14.4 oz)    Exam:   General:  Speaking full sentences, no distress.   Cardiovascular: S 1, S 2 RRR  Respiratory: crackles bases.   Abdomen: obese, distended,  nT  Musculoskeletal: plus 2 edema.   Data Reviewed: Basic Metabolic Panel:  Recent Labs Lab 01/29/14 0945 01/31/14 1724 02/01/14 0403 02/02/14 0510 02/03/14 0500  NA 140 141 138 137 136*  K 3.8 4.1 4.5 4.6 4.2  CL 103 101 97 96 96  CO2 30 24 23 28 27   GLUCOSE 104* 128* 212* 133* 139*  BUN 28* 29* 35* 50* 55*  CREATININE 1.3 1.33 1.60* 1.72* 1.59*  CALCIUM 9.7 10.0 9.4 9.4 9.4   Liver Function Tests: No results found for this basename: AST, ALT, ALKPHOS, BILITOT, PROT, ALBUMIN,  in the last 168 hours No results found for this basename: LIPASE, AMYLASE,  in the last 168 hours No results found for this basename: AMMONIA,  in the last 168 hours CBC:  Recent Labs Lab 01/31/14 1724 02/01/14 0403 02/02/14 0510  WBC 7.9 7.5 14.4*  HGB 13.0 12.7* 12.5*  HCT 39.3 40.2 39.4  MCV 92.5 93.7 92.9  PLT 211 191 218   Cardiac Enzymes:  Recent Labs Lab 01/31/14 1959 01/31/14 2234 02/01/14 0403 02/01/14 1019  TROPONINI <0.30 <0.30 <0.30 <0.30   BNP (last 3 results)  Recent Labs  01/07/14 0005 01/29/14 0945 01/31/14 1724  PROBNP 1118.0* 203.0* 1616.0*   CBG: No results found for this basename: GLUCAP,  in the last 168 hours  No results found for this or any previous visit (from the past 240 hour(s)).   Studies: No results found.  Scheduled Meds: . antiseptic oral rinse  15  mL Mouth Rinse q12n4p  . apixaban  5 mg Oral BID  . aspirin EC  81 mg Oral Daily  . azithromycin  500 mg Intravenous QHS  . chlorhexidine  15 mL Mouth Rinse BID  . clotrimazole  10 mg Oral 5 X Daily  . doxazosin  8 mg Oral QHS  . furosemide  20 mg Oral BID  . guaiFENesin  600 mg Oral BID  . HYDROcodone-acetaminophen  1 tablet Oral 5 X Daily  . HYDROmorphone  4 mg Oral QHS  . ipratropium-albuterol  3 mL Nebulization QID  . methylPREDNISolone (SOLU-MEDROL) injection  60 mg Intravenous Q6H  . mometasone-formoterol  2 puff Inhalation BID  . nicotine  14 mg Transdermal Daily  . sodium  chloride  3 mL Intravenous Q12H   Continuous Infusions:   Active Problems:   Acute exacerbation of chronic obstructive pulmonary disease (COPD)   Acute on chronic systolic CHF (congestive heart failure)   Chronic chest pain   Acute bronchitis   Acute on chronic respiratory failure   Chronic pain   PAF (paroxysmal atrial fibrillation)   CKD (chronic kidney disease), stage III   Renal cancer   Chronic systolic CHF (congestive heart failure)   CAD in native artery   COPD exacerbation    Time spent: 25 minutes.     Niel Hummer A  [facility] Pager [phone]. If 7PM-7AM, please contact night-coverage at [email or web address], password [facility] 02/03/2014, 12:05 PM  LOS: 3 days

## 2014-02-04 ENCOUNTER — Ambulatory Visit: Payer: PRIVATE HEALTH INSURANCE | Admitting: Family

## 2014-02-04 ENCOUNTER — Encounter (HOSPITAL_COMMUNITY): Payer: PRIVATE HEALTH INSURANCE

## 2014-02-04 ENCOUNTER — Encounter (HOSPITAL_COMMUNITY): Payer: Self-pay

## 2014-02-04 DIAGNOSIS — I4891 Unspecified atrial fibrillation: Secondary | ICD-10-CM

## 2014-02-04 LAB — BASIC METABOLIC PANEL
Anion gap: 13 (ref 5–15)
BUN: 49 mg/dL — AB (ref 6–23)
CALCIUM: 9.2 mg/dL (ref 8.4–10.5)
CO2: 27 mEq/L (ref 19–32)
Chloride: 98 mEq/L (ref 96–112)
Creatinine, Ser: 1.43 mg/dL — ABNORMAL HIGH (ref 0.50–1.35)
GFR, EST AFRICAN AMERICAN: 57 mL/min — AB (ref >90)
GFR, EST NON AFRICAN AMERICAN: 49 mL/min — AB (ref >90)
Glucose, Bld: 181 mg/dL — ABNORMAL HIGH (ref 70–99)
POTASSIUM: 4.4 meq/L (ref 3.7–5.3)
Sodium: 138 mEq/L (ref 137–147)

## 2014-02-04 LAB — TROPONIN I: Troponin I: <0.3 ng/mL (ref <0.30)

## 2014-02-04 MED ORDER — GUAIFENESIN ER 600 MG PO TB12: 600.0000 mg | ORAL_TABLET | Freq: Two times a day (BID) | ORAL | Status: DC

## 2014-02-04 MED ORDER — HYDROMORPHONE HCL 4 MG PO TABS: 4.0000 mg | ORAL_TABLET | Freq: Every day | ORAL | Status: DC

## 2014-02-04 MED ORDER — BENZONATATE 100 MG PO CAPS: 100.0000 mg | ORAL_CAPSULE | Freq: Three times a day (TID) | ORAL | Status: DC | PRN

## 2014-02-04 MED ORDER — HYDROMORPHONE HCL PF 1 MG/ML IJ SOLN
0.5000 mg | Freq: Once | INTRAMUSCULAR | Status: AC
  Administered 2014-02-04: 0.5 mg via INTRAVENOUS
  Filled 2014-02-04: qty 1

## 2014-02-04 MED ORDER — ISOSORBIDE MONONITRATE ER 30 MG PO TB24: 30.0000 mg | ORAL_TABLET | Freq: Every day | ORAL | Status: DC

## 2014-02-04 MED ORDER — NICOTINE 14 MG/24HR TD PT24: 14.0000 mg | MEDICATED_PATCH | Freq: Every day | TRANSDERMAL | Status: DC

## 2014-02-04 MED ORDER — PREDNISONE 20 MG PO TABS: ORAL_TABLET | ORAL | Status: DC

## 2014-02-04 MED ORDER — PREDNISONE 50 MG PO TABS
50.0000 mg | ORAL_TABLET | Freq: Every day | ORAL | Status: DC
  Administered 2014-02-05: 50 mg via ORAL
  Filled 2014-02-04 (×2): qty 1

## 2014-02-04 MED ORDER — AZITHROMYCIN 250 MG PO TABS
500.0000 mg | ORAL_TABLET | Freq: Every day | ORAL | Status: DC
  Administered 2014-02-04: 500 mg via ORAL
  Filled 2014-02-04: qty 1
  Filled 2014-02-04: qty 2

## 2014-02-04 MED ORDER — AZITHROMYCIN 500 MG PO TABS: 500.0000 mg | ORAL_TABLET | Freq: Every day | ORAL | Status: DC

## 2014-02-04 NOTE — Progress Notes (Signed)
This patient is receiving Azithromycin 500mg  IV Q24H. Based on criteria approved by the Pharmacy and Therapeutics Committee, this medication is being converted to the equivalent oral dose form. These criteria include:   . The patient is eating (either orally or per tube) and/or has been taking other orally administered medications for at least 24 hours.  . This patient has no evidence of active gastrointestinal bleeding or impaired GI absorption (gastrectomy, short bowel, patient on TNA or NPO).   If you have questions about this conversion, please contact the pharmacy department.  Kizzie Furnish, PharmD Pager: [phone] 02/04/2014 11:10 AM

## 2014-02-04 NOTE — Progress Notes (Signed)
Received referral from Dr. Chase Caller for Pulmonary Rehab.  Will put referral aside due to recent & current admission.

## 2014-02-04 NOTE — Progress Notes (Signed)
Pt cannot tolerate pills that are large. Azithromycin was given this pm only because it could be crushed. Pt stated, "if the pill is too big for me to take at home, I just do not take it." Pt educated on importance of taking all medications prescribed by doctor. Will continue to monitor pt closely. Carnella [place] I

## 2014-02-04 NOTE — Progress Notes (Signed)
[facility] PROGRESS NOTE  Lucas [name] SEG:[id number] DOB: December 23, 1945 DOA: 01/31/2014 PCP: Salena Saner., MD  Assessment/Plan: 1-Acute on chronic hypoxic respiratory failure; could be multifactorial, COPD exacerbation , Systolic HF exacerbation.  Continue with lasix.   Continue with , azithromycin, nebulizer treatments every 4 hour.  Will taper Solumedrol. Start prednisone.   2-Systolic Heart Failure; Weight 95--97 --97. Cr has decrease to 1.5. Lasix resume.   3-Chest pain; troponin times 3 negative. Patient on eliquis doubt PE. Could be muscle skeletal. Cardio following.  Pain persist. Worse after coughing spell.  Started  guaifenesin. Had another episode today while ambulating off oxygen,. Repeat EKG, cycle enzymes, nitroglycerin. Will cancel discharge.   4-Acute on CKD stage III; Cr increase to 1.7.  Will hold Diovan. Cr has decrease to 1.4. Monitor on oral lasix.   5-Chronic pain: He takes hydrocodone. Continue with  IV dilaudid PRN.   6-PVD; S/P angioplasty of right external iliac artery. Complaining of persistent pain, getting worse. Follow up with Dr Trula Slade.   Code Status: DNR Family Communication: Care  discussed with patient.  Disposition Plan: remain inpatient.    Consultants:  Cardiology  Procedures:  none  Antibiotics:  Azithromycin 7-23  HPI/Subjective: Patient relates feeling better, he wanted to go home today. He relates LE pain chronic.  Patient them developed chest pain, SOB.    Objective: Filed Vitals:   02/04/14 0420  BP: 154/73  Pulse: 77  Temp: 97.3 F (36.3 C)  Resp: 18    Intake/Output Summary (Last 24 hours) at 02/04/14 1200 Last data filed at 02/04/14 0900  Gross per 24 hour  Intake   1810 ml  Output   2200 ml  Net   -390 ml   Filed Weights   02/02/14 0509 02/03/14 0417 02/04/14 0500  Weight: 97.024 kg (213 lb 14.4 oz) 97.478 kg (214 lb 14.4 oz) 97.7 kg (215 lb 6.2 oz)    Exam:   General:  Speaking full  sentences, no distress.   Cardiovascular: S 1, S 2 RRR  Respiratory: crackles bases.   Abdomen: obese, distended, nT  Musculoskeletal: plus 2 edema.   Data Reviewed: Basic Metabolic Panel:  Recent Labs Lab 01/31/14 1724 02/01/14 0403 02/02/14 0510 02/03/14 0500 02/04/14 0454  NA 141 138 137 136* 138  K 4.1 4.5 4.6 4.2 4.4  CL 101 97 96 96 98  CO2 24 23 28 27 27   GLUCOSE 128* 212* 133* 139* 181*  BUN 29* 35* 50* 55* 49*  CREATININE 1.33 1.60* 1.72* 1.59* 1.43*  CALCIUM 10.0 9.4 9.4 9.4 9.2   Liver Function Tests: No results found for this basename: AST, ALT, ALKPHOS, BILITOT, PROT, ALBUMIN,  in the last 168 hours No results found for this basename: LIPASE, AMYLASE,  in the last 168 hours No results found for this basename: AMMONIA,  in the last 168 hours CBC:  Recent Labs Lab 01/31/14 1724 02/01/14 0403 02/02/14 0510  WBC 7.9 7.5 14.4*  HGB 13.0 12.7* 12.5*  HCT 39.3 40.2 39.4  MCV 92.5 93.7 92.9  PLT 211 191 218   Cardiac Enzymes:  Recent Labs Lab 01/31/14 1959 01/31/14 2234 02/01/14 0403 02/01/14 1019  TROPONINI <0.30 <0.30 <0.30 <0.30   BNP (last 3 results)  Recent Labs  01/07/14 0005 01/29/14 0945 01/31/14 1724  PROBNP 1118.0* 203.0* 1616.0*   CBG: No results found for this basename: GLUCAP,  in the last 168 hours  No results found for this or any previous visit (from the past 240  hour(s)).   Studies: No results found.  Scheduled Meds: . antiseptic oral rinse  15 mL Mouth Rinse q12n4p  . apixaban  5 mg Oral BID  . aspirin EC  81 mg Oral Daily  . azithromycin  500 mg Oral QHS  . chlorhexidine  15 mL Mouth Rinse BID  . clotrimazole  10 mg Oral 5 X Daily  . doxazosin  8 mg Oral QHS  . furosemide  20 mg Oral BID  . guaiFENesin  600 mg Oral BID  . HYDROcodone-acetaminophen  1 tablet Oral 5 X Daily  . HYDROmorphone  4 mg Oral QHS  . ipratropium-albuterol  3 mL Nebulization QID  . isosorbide mononitrate  30 mg Oral Daily  .  methylPREDNISolone (SOLU-MEDROL) injection  60 mg Intravenous Q12H  . mometasone-formoterol  2 puff Inhalation BID  . nicotine  14 mg Transdermal Daily  . sodium chloride  3 mL Intravenous Q12H   Continuous Infusions:   Active Problems:   Acute exacerbation of chronic obstructive pulmonary disease (COPD)   Acute on chronic systolic CHF (congestive heart failure)   Chronic chest pain   Acute bronchitis   Acute on chronic respiratory failure   Chronic pain   PAF (paroxysmal atrial fibrillation)   CKD (chronic kidney disease), stage III   Renal cancer   Chronic systolic CHF (congestive heart failure)   CAD in native artery   COPD exacerbation    Time spent: 25 minutes.     Niel Hummer A  [facility] Pager [phone]. If 7PM-7AM, please contact night-coverage at [email or web address], password [facility] 02/04/2014, 12:00 PM  LOS: 4 days

## 2014-02-04 NOTE — Progress Notes (Signed)
Patient ID: Lucas [name], male   DOB: 10-06-1945, 68 y.o.   MRN: [id number] Date of Encounter: 02/04/2014    Active Problems:   Acute exacerbation of chronic obstructive pulmonary disease (COPD)   Acute on chronic systolic CHF (congestive heart failure)   Chronic chest pain   Acute bronchitis   Acute on chronic respiratory failure   Chronic pain   PAF (paroxysmal atrial fibrillation)   CKD (chronic kidney disease), stage III   Renal cancer   Chronic systolic CHF (congestive heart failure)   CAD in native artery   COPD exacerbation    SUBJECTIVE Patient continues to complain of more leg pain with less chest pain.. Just weak & coughing up green sputum.  Chest pain is worse after coughing.  When I saw him this AM, he was not noting as much of the chest pain & wanted to go home.  Later on - he told the [facility] MD that he didn't want to go home.  CURRENT MEDS . antiseptic oral rinse  15 mL Mouth Rinse q12n4p  . apixaban  5 mg Oral BID  . aspirin EC  81 mg Oral Daily  . azithromycin  500 mg Oral QHS  . chlorhexidine  15 mL Mouth Rinse BID  . clotrimazole  10 mg Oral 5 X Daily  . doxazosin  8 mg Oral QHS  . furosemide  20 mg Oral BID  . guaiFENesin  600 mg Oral BID  . HYDROcodone-acetaminophen  1 tablet Oral 5 X Daily  . HYDROmorphone  4 mg Oral QHS  . ipratropium-albuterol  3 mL Nebulization QID  . isosorbide mononitrate  30 mg Oral Daily  . methylPREDNISolone (SOLU-MEDROL) injection  60 mg Intravenous Q12H  . mometasone-formoterol  2 puff Inhalation BID  . nicotine  14 mg Transdermal Daily  . sodium chloride  3 mL Intravenous Q12H    OBJECTIVE  Filed Vitals:   02/03/14 1959 02/04/14 0420 02/04/14 0500 02/04/14 0819  BP:  154/73    Pulse: 84 77    Temp:  97.3 F (36.3 C)    TempSrc:  Oral    Resp: 20 18    Height:      Weight:   215 lb 6.2 oz (97.7 kg)   SpO2:  97%  94%    Intake/Output Summary (Last 24 hours) at 02/04/14 1147 Last data filed at 02/04/14 0900  Gross per 24 hour  Intake   1810 ml  Output   2200 ml  Net   -390 ml   Filed Weights   02/02/14 0509 02/03/14 0417 02/04/14 0500  Weight: 213 lb 14.4 oz (97.024 kg) 214 lb 14.4 oz (97.478 kg) 215 lb 6.2 oz (97.7 kg)    PHYSICAL EXAM General: Pleasant, NAD. Nasal oxygen in place.  Neuro: Alert and oriented X 3. Moves all extremities spontaneously.  Psych: Normal affect. Depressed, complains of ongoing leg pain  HEENT: [place] 7/AT, EOMI Neck: Supple without bruits or JVD.  Lungs: Lung sounds are distant with diffuse expiratory wheezes; coughs after deep inspiration Heart: RRR no s3, s4, or murmurs.  Abdomen: Soft, non-tender, non-distended, BS + x 4.  Extremities: Trace pretibial and pedal edema. Very weak pedal pulses  Accessory Clinical Findings CBC  Recent Labs  02/02/14 0510  WBC 14.4*  HGB 12.5*  HCT 39.4  MCV 92.9  PLT 244   Basic Metabolic Panel  Recent Labs  02/03/14 0500 02/04/14 0454  NA 136* 138  K 4.2 4.4  CL  96 98  CO2 27 27  GLUCOSE 139* 181*  BUN 55* 49*  CREATININE 1.59* 1.43*  CALCIUM 9.4 9.2   TELE:  Normal sinus rhythm.  Occasional PVC  ASSESSMENT AND PLAN  1. Acute on chronic respiratory failure, likely multifactorial  2. Recurrent COPD exacerbation with acute bronchitis  3. Acute on chronic systolic CHF  4. CAD s/p CABGx5, last cath 12/2013 with stable anatomy (2/5 grafts patent), for med rx  5. CKD stage III in setting of prior nephrectomy  6. HTN, moderately elevated  7. Pulm nodules by CT 01/07/14, repeat planned by pulm in 6 months  8. PAF, on Eliquis   Plan: His creatinine is improved and his weight is up.  Re-started on his home dose of furosemide 20 mg twice a day by mouth. Consider changing standing dose to 40 mg AM & 20 mg PM if UOP not adequate & wgt continues to increase.  He's had multiple admissions for this combination of COPD and CHF over the last several months, agree that it is reasonable to  not shoot for rapid discharge  without clear-cut objectives of symptom control achieved.   Signed, Leonie Man MD

## 2014-02-05 ENCOUNTER — Telehealth: Payer: Self-pay | Admitting: Surgery

## 2014-02-05 DIAGNOSIS — G8929 Other chronic pain: Secondary | ICD-10-CM

## 2014-02-05 LAB — BASIC METABOLIC PANEL
Anion gap: 12 (ref 5–15)
BUN: 48 mg/dL — AB (ref 6–23)
CHLORIDE: 99 meq/L (ref 96–112)
CO2: 28 mEq/L (ref 19–32)
Calcium: 9.1 mg/dL (ref 8.4–10.5)
Creatinine, Ser: 1.41 mg/dL — ABNORMAL HIGH (ref 0.50–1.35)
GFR calc Af Amer: 58 mL/min — ABNORMAL LOW (ref >90)
GFR calc non Af Amer: 50 mL/min — ABNORMAL LOW (ref >90)
Glucose, Bld: 136 mg/dL — ABNORMAL HIGH (ref 70–99)
Potassium: 4.3 mEq/L (ref 3.7–5.3)
Sodium: 139 mEq/L (ref 137–147)

## 2014-02-05 LAB — TROPONIN I: Troponin I: <0.3 ng/mL (ref <0.30)

## 2014-02-05 MED ORDER — FUROSEMIDE 20 MG PO TABS
20.0000 mg | ORAL_TABLET | Freq: Every day | ORAL | Status: DC
  Filled 2014-02-05: qty 1

## 2014-02-05 MED ORDER — FUROSEMIDE 20 MG PO TABS: ORAL_TABLET | ORAL | Status: DC

## 2014-02-05 MED ORDER — FUROSEMIDE 40 MG PO TABS
40.0000 mg | ORAL_TABLET | Freq: Every day | ORAL | Status: DC
  Administered 2014-02-05: 40 mg via ORAL
  Filled 2014-02-05 (×2): qty 1

## 2014-02-05 MED ORDER — FUROSEMIDE 40 MG PO TABS: 40.0000 mg | ORAL_TABLET | Freq: Every day | ORAL | Status: DC

## 2014-02-05 NOTE — Progress Notes (Signed)
Patient ID: Lucas [name], male   DOB: 12-25-1945, 68 y.o.   MRN: [id number] Date of Encounter: 02/05/2014    Active Problems:   Chronic chest pain - Musculoskeletal   Acute exacerbation of chronic obstructive pulmonary disease (COPD)   Acute on chronic systolic CHF (congestive heart failure)   Acute bronchitis   Acute on chronic respiratory failure   Chronic pain   PAF (paroxysmal atrial fibrillation)   CKD (chronic kidney disease), stage III   Renal cancer   Chronic systolic CHF (congestive heart failure)   CAD in native artery   COPD exacerbation   SUBJECTIVE Plan for d/c yesterday aborted due to recurrent CP.  Remains Troponin negative - making ischemic etiology very unlikely. Continues moderate diuresis on PO Lasix with stable to improving renal function.  Feels good this AM & ready to go home BM x 2 this AM  CURRENT MEDS . antiseptic oral rinse  15 mL Mouth Rinse q12n4p  . apixaban  5 mg Oral BID  . aspirin EC  81 mg Oral Daily  . azithromycin  500 mg Oral QHS  . chlorhexidine  15 mL Mouth Rinse BID  . clotrimazole  10 mg Oral 5 X Daily  . doxazosin  8 mg Oral QHS  . furosemide  20 mg Oral Daily  . furosemide  40 mg Oral QAC breakfast  . guaiFENesin  600 mg Oral BID  . HYDROcodone-acetaminophen  1 tablet Oral 5 X Daily  . HYDROmorphone  4 mg Oral QHS  . ipratropium-albuterol  3 mL Nebulization QID  . isosorbide mononitrate  30 mg Oral Daily  . mometasone-formoterol  2 puff Inhalation BID  . nicotine  14 mg Transdermal Daily  . predniSONE  50 mg Oral Q breakfast  . sodium chloride  3 mL Intravenous Q12H    OBJECTIVE  Filed Vitals:   02/04/14 1550 02/04/14 1944 02/04/14 2106 02/05/14 0522  BP:   133/65 160/86  Pulse:   79 68  Temp:   98.1 F (36.7 C) 98.1 F (36.7 C)  TempSrc:   Oral Oral  Resp:   16 20  Height:      Weight:    217 lb 9.6 oz (98.703 kg)  SpO2: 87% 91% 93% 96%    Intake/Output Summary (Last 24 hours) at 02/05/14 0707 Last data  filed at 02/05/14 7619  Gross per 24 hour  Intake   1520 ml  Output   2175 ml  Net   -655 ml   Filed Weights   02/03/14 0417 02/04/14 0500 02/05/14 0522  Weight: 214 lb 14.4 oz (97.478 kg) 215 lb 6.2 oz (97.7 kg) 217 lb 9.6 oz (98.703 kg)  Not sure how he  Can be net neg ~ 800 mL & be up 3 LB  PHYSICAL EXAM General: Pleasant, NAD. Nasal oxygen in place.  Neuro: Alert and oriented X 3. Moves all extremities spontaneously.  Psych: Normal affect. Depressed, complains of ongoing leg pain  HEENT: [place]/AT, EOMI Neck: Supple without bruits or JVD.  Lungs: Lung sounds are distant with diffuse expiratory wheezes; coughs after deep inspiration Heart: RRR no s3, s4, or murmurs.  Abdomen: Soft, non-tender, non-distended, BS + x 4.  Extremities: Trace pretibial and pedal edema. Very weak pedal pulses  Accessory Clinical Findings  Basic Metabolic Panel  Recent Labs  02/04/14 0454 02/05/14 0512  NA 138 139  K 4.4 4.3  CL 98 99  CO2 27 28  GLUCOSE 181* 136*  BUN  49* 48*  CREATININE 1.43* 1.41*  CALCIUM 9.2 9.1   TELE:  Normal sinus rhythm.  Occasional PVC  ASSESSMENT AND PLAN  1. Acute on chronic respiratory failure, likely multifactorial  2. Recurrent COPD exacerbation with acute bronchitis  3. Acute on chronic systolic CHF  4. CAD s/p CABGx5, last cath 12/2013 with stable anatomy (2/5 grafts patent), for med rx  5. CKD stage III in setting of prior nephrectomy  6. HTN, moderately elevated  7. Pulm nodules by CT 01/07/14, repeat planned by pulm in 6 months  8. PAF, on Eliquis with no bleeding complication; agreed that PE unlikely 9. MSK Chest pain -- prolonged L sided CP made worse with cough & inspiration.  Very unlikely to be ischemic  Plan: His creatinine is improved and his weight is up which does not make sense in light of Net neg UOP.  Re-started on his home dose of furosemide 20 mg twice a day by mouth. -- increased standing dose to 40 mg AM & 20 mg PM   He's had multiple  admissions for this combination of COPD and CHF over the last several months, He feels good this AM & is ready for d/c.  Not much more to do from Cardiac standpoint.  Will help arrange f/u with Dr. Johnsie Cancel.   Signed, Leonie Man MD

## 2014-02-05 NOTE — Discharge Summary (Signed)
Physician Discharge Summary  Lucas [name] WPY:[id number] DOB: Nov 08, 1945 DOA: 01/31/2014  PCP: Salena Saner., MD  Admit date: 01/31/2014 Discharge date: 02/05/2014  Time spent: 35 minutes  Recommendations for Outpatient Follow-up:  1. Needs to follow up with Dr Rolanda Lundborg for further care PVD. 2. Needs B-met to follow renal function.  3. Needs further adjustment of lasix.  4. Continue counseling regarding smoking cessation.   Discharge Diagnoses:    Acute exacerbation of chronic obstructive pulmonary disease (COPD)   Acute on chronic systolic CHF (congestive heart failure)   Chronic chest pain - Musculoskeletal   Acute bronchitis   Acute on chronic respiratory failure   Chronic pain   PAF (paroxysmal atrial fibrillation)   CKD (chronic kidney disease), stage III   Renal cancer   Chronic systolic CHF (congestive heart failure)   CAD in native artery   COPD exacerbation   Discharge Condition: stable.   Diet recommendation: Heart Healthy  Filed Weights   02/03/14 0417 02/04/14 0500 02/05/14 0522  Weight: 97.478 kg (214 lb 14.4 oz) 97.7 kg (215 lb 6.2 oz) 98.703 kg (217 lb 9.6 oz)    History of present illness:  Pt is a 68 yr old male who is chronically ill. He states that this illness began the morning of admission . He states that he has increased cough and wheezing. He denies fevers or chills. He states that the chest tightness is worse with deep respiration and cough. He states that the cough is somewhat worse than his usual chronic cough. It occasionally is productive of clear sputum. He is found to be hypoxic upon arrival in the ED.   Hospital Course:  1-Acute on chronic hypoxic respiratory failure;  multifactorial, COPD exacerbation , Systolic HF exacerbation. He was treated with nebulizer treatment, azithromycin, Laisx. IV solumedrol was transition to prednisone.  -Smoking cessation counseling.   2-Systolic Heart Failure; Weight 95--97 --97. Cr has decrease to  1.4. He was treated with IV lasix. Subsequently lasix was on hold due to increase cr. His renal function improved. Lasix was resume. He will be discharge on 40 mg in am and 20 mg at night.   3-Chest pain; troponin times 3 negative. Patient on eliquis doubt PE. Could be muscle skeletal. Cardio following. Pain persist. Worse after coughing spell. Started guaifenesin. Had another episode 7-27 while ambulating off oxygen,. Repeated enzymes negative, EKG unchanged. Continue with Imdur.   4-Acute on CKD stage III; Cr increase to 1.7. Will hold Diovan. Cr has decrease to 1.4. Monitor on oral lasix.   5-Chronic pain: He takes hydrocodone. Continue with IV dilaudid PRN. Will provide refill for dilaudid # 3. I have advised him to get prescription from PCP.   6-PVD; S/P angioplasty of right external iliac artery. Complaining of persistent pain. . Follow up with Dr Trula Slade.   Procedures:  none  Consultations:  Cardiology  Discharge Exam: Filed Vitals:   02/05/14 0522  BP: 160/86  Pulse: 68  Temp: 98.1 F (36.7 C)  Resp: 20    General: Alert in no distress.  Cardiovascular: S 1, S 2 RRR Respiratory: CTA  Discharge Instructions You were cared for by a hospitalist during your hospital stay. If you have any questions about your discharge medications or the care you received while you were in the hospital after you are discharged, you can call the unit and asked to speak with the hospitalist on call if the hospitalist that took care of you is not available. Once you are discharged, your  primary care physician will handle any further medical issues. Please note that NO REFILLS for any discharge medications will be authorized once you are discharged, as it is imperative that you return to your primary care physician (or establish a relationship with a primary care physician if you do not have one) for your aftercare needs so that they can reassess your need for medications and monitor your lab  values.  Discharge Instructions   Diet - low sodium heart healthy    Complete by:  As directed      Increase activity slowly    Complete by:  As directed             Medication List    STOP taking these medications       valsartan 40 MG tablet  Commonly known as:  DIOVAN      TAKE these medications       albuterol (2.5 MG/3ML) 0.083% nebulizer solution  Commonly known as:  PROVENTIL  Take 2.5 mg by nebulization 4 (four) times daily.     albuterol 108 (90 BASE) MCG/ACT inhaler  Commonly known as:  PROVENTIL HFA;VENTOLIN HFA  Inhale 2 puffs into the lungs every 6 (six) hours as needed for wheezing or shortness of breath. Shortness of breath     ALPRAZolam 1 MG tablet  Commonly known as:  XANAX  Take 1 tablet (1 mg total) by mouth 3 (three) times daily as needed for anxiety.     apixaban 5 MG Tabs tablet  Commonly known as:  ELIQUIS  Take 1 tablet (5 mg total) by mouth 2 (two) times daily.     aspirin EC 81 MG tablet  Take 81 mg by mouth daily.     azithromycin 500 MG tablet  Commonly known as:  ZITHROMAX  Take 1 tablet (500 mg total) by mouth daily.     benzonatate 100 MG capsule  Commonly known as:  TESSALON  Take 1 capsule (100 mg total) by mouth 3 (three) times daily as needed for cough.     clotrimazole 10 MG troche  Commonly known as:  MYCELEX  Take 1 tablet (10 mg total) by mouth 5 (five) times daily.     doxazosin 8 MG tablet  Commonly known as:  CARDURA  Take 8 mg by mouth at bedtime.     furosemide 20 MG tablet  Commonly known as:  LASIX  Take 1 tablet at 4 PM daily     furosemide 40 MG tablet  Commonly known as:  LASIX  Take 1 tablet (40 mg total) by mouth daily before breakfast.     guaiFENesin 600 MG 12 hr tablet  Commonly known as:  MUCINEX  Take 1 tablet (600 mg total) by mouth 2 (two) times daily.     HYDROcodone-acetaminophen 10-325 MG per tablet  Commonly known as:  NORCO  Take 1 tablet by mouth 5 (five) times daily.      HYDROmorphone 4 MG tablet  Commonly known as:  DILAUDID  Take 1 tablet (4 mg total) by mouth at bedtime.     ipratropium 0.02 % nebulizer solution  Commonly known as:  ATROVENT  Take 0.5 mg by nebulization 4 (four) times daily.     Ipratropium-Albuterol 20-100 MCG/ACT Aers respimat  Commonly known as:  COMBIVENT RESPIMAT  Inhale 1 puff into the lungs every 6 (six) hours as needed for wheezing or shortness of breath.     isosorbide mononitrate 30 MG 24 hr tablet  Commonly known  as:  IMDUR  Take 1 tablet (30 mg total) by mouth daily.     mometasone-formoterol 100-5 MCG/ACT Aero  Commonly known as:  DULERA  Inhale 2 puffs into the lungs 2 (two) times daily.     nicotine 14 mg/24hr patch  Commonly known as:  NICODERM CQ - dosed in mg/24 hours  Place 1 patch (14 mg total) onto the skin daily.     NITROSTAT 0.3 MG SL tablet  Generic drug:  nitroGLYCERIN  Place 0.3 mg under the tongue every 5 (five) minutes as needed for chest pain.     NON FORMULARY  Place 2 L into the nose at bedtime. O2 at night only     predniSONE 20 MG tablet  Commonly known as:  DELTASONE  3 tabs for 3 days, then 2 tabs for 3 days, 1 tabs for 2 days, then half  tab for 1 days, then stop       Allergies  Allergen Reactions  . Ativan [Lorazepam] Other (See Comments)    Increases agitation, tolerates xanax  . Lisinopril Cough  . Morphine And Related     Hallucinations, too sedated when given with ativan  . Ibuprofen Hives, Nausea And Vomiting and Rash    Tolerates baby aspirin per patient.         Follow-up Information   Follow up with Trula Slade IV, [place], MD In 1 week.   Specialty:  Vascular Surgery   Contact information:   [place] [address] [phone]       Follow up with [facility], MD In 1 week.   Specialty:  Pulmonary Disease   Contact information:   [place] [address] [phone]       Follow up with Salena Saner., MD.   Specialty:   Internal Medicine   Contact information:   [place] [address] [phone]        The results of significant diagnostics from this hospitalization (including imaging, microbiology, ancillary and laboratory) are listed below for reference.    Significant Diagnostic Studies: Dg Chest 2 View  01/21/2014   CLINICAL DATA:  Shortness of breath and chest pain  EXAM: CHEST  2 VIEW  COMPARISON:  Chest radiograph and chest CT January 07, 2014  FINDINGS: There is underlying emphysematous change. There is no appreciable edema or consolidation. The previously noted interstitial prominence is stable. There is no new opacity. No airspace consolidation. No adenopathy. Heart is upper normal in size with pulmonary vascularity within normal limits. Patient is status post coronary artery bypass grafting. There is degenerative change in the thoracic spine. There is evidence of old trauma involving the lateral left clavicle.  IMPRESSION: Underlying emphysema with diffuse interstitial prominence which is stable. There is no frank edema or consolidation. The small nodular opacities seen on the recent chest CT are not well delineated on radiographic examination. There is no airspace consolidation.   Electronically Signed   By: Lowella Grip M.D.   On: 01/21/2014 18:52   Ct Angio Chest Pe W/cm &/or Wo Cm  01/07/2014   CLINICAL DATA:  Status post angioplasty 12/26/2013. Chest pain and shortness of breath.  EXAM: CT ANGIOGRAPHY CHEST WITH CONTRAST  TECHNIQUE: Multidetector CT imaging of the chest was performed using the standard protocol during bolus administration of intravenous contrast. Multiplanar CT image reconstructions and MIPs were obtained to evaluate the vascular anatomy.  CONTRAST:  157m OMNIPAQUE IOHEXOL 350 MG/ML SOLN  COMPARISON:  None.  FINDINGS: THORACIC INLET/BODY WALL:  Symmetric gynecomastia.  MEDIASTINUM:  Mild cardiomegaly. Diffuse coronary artery atherosclerosis, status  post CABG. Cardiac motion limits assessment of graft patency, especially of the venous grafts. There is no pericardial effusion. No aortic aneurysm or dissection. There is notable noncalcified plaque or thrombus along the posterior mid descending thoracic aorta. Motion and bolus dispersion decreases sensitivity in evaluating the pulmonary arteries beyond the proximal segmental level. There is no evidence of pulmonary embolism.  LUNG WINDOWS:  Moderate centrilobular emphysema. There are small scattered smooth pulmonary nodules, most peripheral:  Right lower lobe, image 43, 6 mm.  Right lower lobe, image 40, 4 mm.  Right middle lobe, image 46, 2 nodules measuring 5 and 6 mm.  No consolidation, effusion, or pneumothorax. There is diffuse bronchial wall thickening and mucoid impaction, likely also smoking-related.  UPPER ABDOMEN:  Partially imaged changes of left nephrectomy.  OSSEOUS:  A lower cervical corpectomy and strut graft is noted.  Review of the MIP images confirms the above findings.  IMPRESSION: 1. Negative for pulmonary embolism to the proximal segmental level. Negative for aortic dissection. 2. COPD. 3. Small pulmonary nodules, measuring up to 6 mm in the right middle and lower lobes. See recommendations below.  RECOMMENDATIONS: Given emphysema, follow-up chest CT at 6 - 12 months is recommended. This recommendation follows the consensus statement: Guidelines for Management of Small Pulmonary Nodules Detected on CT Scans: A Statement from the [employer] as published in Radiology 2005;237:395-400.   Electronically Signed   By: Jorje Guild M.D.   On: 01/07/2014 02:30   Dg Chest Port 1 View  01/31/2014   CLINICAL DATA:  Shortness of breath.  EXAM: PORTABLE CHEST - 1 VIEW  COMPARISON:  CT chest 01/07/2014. PA and lateral chest 01/21/2014. Single view of the chest 01/07/2014.  FINDINGS: The patient is status post CABG. Lung volumes are low with basilar atelectasis. Heart size is normal. No  pneumothorax or pleural effusion.  IMPRESSION: No acute finding in a low volume chest.   Electronically Signed   By: Inge Rise M.D.   On: 01/31/2014 17:40   Dg Chest Port 1 View  01/07/2014   CLINICAL DATA:  Chest pain, prior myocardial infarction.  EXAM: PORTABLE CHEST - 1 VIEW  COMPARISON:  Chest radiograph Nov 14, 2013  FINDINGS: Cardiac silhouette remains mildly enlarged, status post median sternotomy for CABG. Mild chronic interstitial changes, similar without pleural effusions or focal consolidations. No pneumothorax.  Multiple EKG lines overlie the patient and may obscure subtle underlying pathology. Soft tissue planes and included osseous structures are nonsuspicious.  IMPRESSION: Stable cardiomegaly and chronic interstitial changes without superimposed acute pulmonary process.   Electronically Signed   By: Elon Alas   On: 01/07/2014 00:55    Microbiology: No results found for this or any previous visit (from the past 240 hour(s)).   Labs: Basic Metabolic Panel:  Recent Labs Lab 02/01/14 0403 02/02/14 0510 02/03/14 0500 02/04/14 0454 02/05/14 0512  NA 138 137 136* 138 139  K 4.5 4.6 4.2 4.4 4.3  CL 97 96 96 98 99  CO2 23 28 27 27 28   GLUCOSE 212* 133* 139* 181* 136*  BUN 35* 50* 55* 49* 48*  CREATININE 1.60* 1.72* 1.59* 1.43* 1.41*  CALCIUM 9.4 9.4 9.4 9.2 9.1   Liver Function Tests: No results found for this basename: AST, ALT, ALKPHOS, BILITOT, PROT, ALBUMIN,  in the last 168 hours No results found for this basename: LIPASE, AMYLASE,  in  the last 168 hours No results found for this basename: AMMONIA,  in the last 168 hours CBC:  Recent Labs Lab 01/31/14 1724 02/01/14 0403 02/02/14 0510  WBC 7.9 7.5 14.4*  HGB 13.0 12.7* 12.5*  HCT 39.3 40.2 39.4  MCV 92.5 93.7 92.9  PLT 211 191 218   Cardiac Enzymes:  Recent Labs Lab 02/01/14 0403 02/01/14 1019 02/04/14 1206 02/04/14 1743 02/05/14 0010  TROPONINI <0.30 <0.30 <0.30 <0.30 <0.30   BNP: BNP  (last 3 results)  Recent Labs  01/07/14 0005 01/29/14 0945 01/31/14 1724  PROBNP 1118.0* 203.0* 1616.0*   CBG: No results found for this basename: GLUCAP,  in the last 168 hours     Signed:  Niel Hummer A  [facility] 02/05/2014, 9:14 AM

## 2014-02-05 NOTE — Telephone Encounter (Signed)
Message copied by Gena Fray on Tue Feb 05, 2014  1:16 PM ------      Message from: Denman George      Created: Mon Feb 04, 2014 11:06 AM      Regarding: needs his f/u appt. rescheduled with [facility]        Please let me know when the f/u appt. Is rescheduled to, so I can update [facility].  Thanks.       ----- Message -----         From: Rosetta Posner, MD         Sent: 02/02/2014   6:30 PM           To: [facility] Charge Pool            I was notified by Mary Sella that the patient was admitted to [facility] with worsening COPD.            He has an appointment to see Dr. Trula Slade in followup on Monday. Reports that he has had no improvement in his claudication symptoms since angioplasty one month ago. Please let Dr. Trula Slade no about this and arrange followup once his pulmonary status has stabilized       ------

## 2014-02-05 NOTE — Addendum Note (Signed)
Addended by: Parke Poisson E on: 02/05/2014 04:40 PM   Modules accepted: Orders

## 2014-02-05 NOTE — Progress Notes (Signed)
Reviewed and agree with current assessment. Patient sleeping, no change in plan of car. Will continue to monitor.  SRP, RN

## 2014-02-05 NOTE — Telephone Encounter (Signed)
Patient is rescheduled for 03/04/14 although he wasn't sure that would work for him. He said he would call back if he needed to reschedule, dpm

## 2014-02-18 ENCOUNTER — Ambulatory Visit (HOSPITAL_COMMUNITY)
Admission: RE | Admit: 2014-02-18 | Discharge: 2014-02-18 | Disposition: A | Discharge status: Home / Self Care | Payer: Medicare Other | Source: Ambulatory Visit | Attending: Surgery | Admitting: Surgery

## 2014-02-18 ENCOUNTER — Ambulatory Visit (INDEPENDENT_AMBULATORY_CARE_PROVIDER_SITE_OTHER)
Admission: RE | Admit: 2014-02-18 | Discharge: 2014-02-18 | Disposition: A | Discharge status: Home / Self Care | Payer: Medicare Other | Source: Ambulatory Visit | Attending: Surgery | Admitting: Surgery

## 2014-02-18 DIAGNOSIS — Z01810 Encounter for preprocedural cardiovascular examination: Secondary | ICD-10-CM

## 2014-02-18 DIAGNOSIS — Z48812 Encounter for surgical aftercare following surgery on the circulatory system: Secondary | ICD-10-CM | POA: Diagnosis not present

## 2014-02-18 DIAGNOSIS — I739 Peripheral vascular disease, unspecified: Secondary | ICD-10-CM

## 2014-02-18 DIAGNOSIS — I70219 Atherosclerosis of native arteries of extremities with intermittent claudication, unspecified extremity: Secondary | ICD-10-CM | POA: Diagnosis present

## 2014-02-19 ENCOUNTER — Encounter: Payer: Self-pay | Admitting: Surgery

## 2014-02-20 ENCOUNTER — Ambulatory Visit (INDEPENDENT_AMBULATORY_CARE_PROVIDER_SITE_OTHER): Payer: Medicare Other | Admitting: Surgery

## 2014-02-20 ENCOUNTER — Encounter: Payer: Self-pay | Admitting: Surgery

## 2014-02-20 VITALS — BP 173/65 | HR 85 | Resp 20 | Ht 69.0 in | Wt 210.0 lb

## 2014-02-20 DIAGNOSIS — I70219 Atherosclerosis of native arteries of extremities with intermittent claudication, unspecified extremity: Secondary | ICD-10-CM

## 2014-02-20 NOTE — Progress Notes (Signed)
Patient name: Lucas [name] MRN: [id number] DOB: May 17, 1946 Sex: male     Chief Complaint  Patient presents with  . Re-evaluation    to discuss right leg claudication /lab study    HISTORY OF PRESENT ILLNESS: The patient comes back in today for continued followup of his right leg claudication.  On 12/25/2013 he underwent angioplasty of a right external iliac artery stenosis.  He was found at that time to have right superficial femoral artery occlusion with reconstitution of the above-knee popliteal artery and two-vessel runoff via the peroneal and posterior tibial artery.  The patient reports that he did see improvement in his walking distance, however he still has significant calf and thigh cramping with activity which does bother him.  He does not have any open wounds.  He admits to recently trying 3 cigarettes but has not had any more.  He still complains of a cough and shortness of breath which he attributes to his COPD as well as his heart failure.  Past Medical History  Diagnosis Date  . CAD (coronary artery disease)     a. s/p CABG x 5;  b. 04/2012 Cath: patent LIMA->LAD and VG->OM3, all other grafts occluded. c. Cath 12/2013: patent SVG-OM3 & LIMA-LAD, known occ other VG.  Marland Kitchen Hypertension   . Anxiety   . Adjustment disorder with anxiety   . Hyperlipidemia     a. Unable to take statins.  Marland Kitchen BPH (benign prostatic hypertrophy)   . COPD (chronic obstructive pulmonary disease)     a. on home O2.  . Arthritis   . Ischemic cardiomyopathy     a. 06/2013 Echo: EF 30-35%, no reg wma's, Gr2 DD, triv AI, mod dil LA, nl RV fxn.  . Chronic systolic CHF (congestive heart failure)     a. 06/2013 Echo: EF 30-35%.  . Prostate cancer dx'd 2012  . Renal cancer dx'd 1997    lt nephrectomy  . CKD (chronic kidney disease), stage III   . Tobacco abuse     a. 72 yr hx as of 2015, transitioned to e-cig.  Marland Kitchen Syncope     a. 08/2013: the day following cath, no injury, had received multiple  doses of Versed for anxiety - this was felt to be a contributing factor.  Marland Kitchen PAF (paroxysmal atrial fibrillation)   . Hypotension     a. H/o soft BP prohibiting ACEI/ARB use.  . Chronic pain   . PAD (peripheral artery disease)     a. 12/2013: PV angio s/p angioplasty to R external iliac artery stenosis, also has R SFA occlusion with reconstitution of above-knee popliteal artery, 2 vessel runoff via peroneal and posterior tib.  . Pulmonary nodules     a. 12/2013:  small pulmonary nodules measuring up to 15mm in RM/LL, f/u recommended 6-12 months.    Past Surgical History  Procedure Laterality Date  . Coronary artery bypass graft      x 5  . Cardiac catheterization    . Nephrectomy      left nephrectomy for ca  . Posterior cervical laminectomy      x 8   limited ROM  and can't lie flat  . Heart stents      x 5  . Robot assisted laparoscopic radical prostatectomy      for prostate cancer  . Cystoscopy with litholapaxy  05/01/2012    Procedure: CYSTOSCOPY WITH LITHOLAPAXY;  Surgeon: Dutch Gray, MD;  Location: [facility];  Service: Urology;  Laterality:  N/A;  . Prostate cancer    . Kidney surgery Left 1994    History   Social History  . Marital Status: Widowed    Spouse Name: N/A    Number of Children: N/A  . Years of Education: N/A   Occupational History  . disabled    Social History Main Topics  . Smoking status: Former Smoker -- 0.30 packs/day for 54 years    Types: Cigarettes    Quit date: 03/29/2013  . Smokeless tobacco: Former Systems developer    Quit date: 04/19/2012     Comment: Using e-cig now  . Alcohol Use: No  . Drug Use: No  . Sexual Activity: Not Currently   Other Topics Concern  . Not on file   Social History Narrative    He lives in [place] by himself.  He is a widower.    He continues to smoke about 4 or 5  cigarettes a day but has a greater than 100 pack-year history having  previously smoked up to 3-4 packs a day over the span about 48 years.     He denies  alcohol or drug use.  He is retired secondary to disability     since the 30s.     Family History  Problem Relation Age of Onset  . Diabetes Mother   . Heart disease Mother   . Hypertension Mother   . Heart attack Mother   . Coronary artery disease    . Heart disease Father   . Diabetes Father   . Heart attack Father   . Heart disease Brother   . Heart attack Brother   . COPD Sister     Allergies as of 02/20/2014 - Review Complete 02/20/2014  Allergen Reaction Noted  . Ativan [lorazepam] Other (See Comments) 04/21/2012  . Lisinopril Cough 08/22/2013  . Morphine and related  04/18/2012  . Ibuprofen Hives, Nausea And Vomiting, and Rash 04/17/2012    Current Outpatient Prescriptions on File Prior to Visit  Medication Sig Dispense Refill  . albuterol (PROVENTIL HFA;VENTOLIN HFA) 108 (90 BASE) MCG/ACT inhaler Inhale 2 puffs into the lungs every 6 (six) hours as needed for wheezing or shortness of breath. Shortness of breath  1 Inhaler  1  . albuterol (PROVENTIL) (2.5 MG/3ML) 0.083% nebulizer solution Take 2.5 mg by nebulization 4 (four) times daily.      Marland Kitchen ALPRAZolam (XANAX) 1 MG tablet Take 1 tablet (1 mg total) by mouth 3 (three) times daily as needed for anxiety.  30 tablet  1  . apixaban (ELIQUIS) 5 MG TABS tablet Take 1 tablet (5 mg total) by mouth 2 (two) times daily.  60 tablet  1  . aspirin EC 81 MG tablet Take 81 mg by mouth daily.      Marland Kitchen azithromycin (ZITHROMAX) 500 MG tablet Take 1 tablet (500 mg total) by mouth daily.  2 tablet  0  . benzonatate (TESSALON) 100 MG capsule Take 1 capsule (100 mg total) by mouth 3 (three) times daily as needed for cough.  20 capsule  0  . clotrimazole (MYCELEX) 10 MG troche Take 1 tablet (10 mg total) by mouth 5 (five) times daily.  35 tablet  0  . doxazosin (CARDURA) 8 MG tablet Take 8 mg by mouth at bedtime.       . furosemide (LASIX) 20 MG tablet Take 1 tablet at 4 PM daily  30 tablet  0  . furosemide (LASIX) 40 MG tablet Take 1 tablet  (40 mg  total) by mouth daily before breakfast.  30 tablet  0  . guaiFENesin (MUCINEX) 600 MG 12 hr tablet Take 1 tablet (600 mg total) by mouth 2 (two) times daily.  30 tablet  0  . HYDROcodone-acetaminophen (NORCO) 10-325 MG per tablet Take 1 tablet by mouth 5 (five) times daily.      Marland Kitchen HYDROmorphone (DILAUDID) 4 MG tablet Take 1 tablet (4 mg total) by mouth at bedtime.  40 tablet  0  . ipratropium (ATROVENT) 0.02 % nebulizer solution Take 0.5 mg by nebulization 4 (four) times daily.      . Ipratropium-Albuterol (COMBIVENT RESPIMAT) 20-100 MCG/ACT AERS respimat Inhale 1 puff into the lungs every 6 (six) hours as needed for wheezing or shortness of breath.  1 Inhaler  1  . isosorbide mononitrate (IMDUR) 30 MG 24 hr tablet Take 1 tablet (30 mg total) by mouth daily.  30 tablet  0  . mometasone-formoterol (DULERA) 100-5 MCG/ACT AERO Inhale 2 puffs into the lungs 2 (two) times daily.  1 Inhaler  0  . nicotine (NICODERM CQ - DOSED IN MG/24 HOURS) 14 mg/24hr patch Place 1 patch (14 mg total) onto the skin daily.  28 patch  0  . NITROSTAT 0.3 MG SL tablet Place 0.3 mg under the tongue every 5 (five) minutes as needed for chest pain.       . NON FORMULARY Place 2 L into the nose at bedtime. O2 at night only      . predniSONE (DELTASONE) 20 MG tablet 3 tabs for 3 days, then 2 tabs for 3 days, 1 tabs for 2 days, then half  tab for 1 days, then stop  18 tablet  0   No current facility-administered medications on file prior to visit.     REVIEW OF SYSTEMS: Please see history of present the  PHYSICAL EXAMINATION:   Vital signs are BP 173/65  Pulse 85  Resp 20  Ht 5\' 9"  (1.753 m)  Wt 210 lb (95.255 kg)  BMI 31.00 kg/m2 General: The patient appears their stated age. HEENT:  No gross abnormalities Pulmonary:  Non labored breathing Musculoskeletal: There are no major deformities. Neurologic: No focal weakness or paresthesias are detected, Skin: There are no ulcer or rashes noted. Psychiatric: The  patient has normal affect. Cardiovascular: There is a regular rate and rhythm without significant murmur appreciated.   Diagnostic Studies I have reviewed his outside ultrasound which shows an occluded superficial femoral artery.  No recurrent stenosis is identified within the iliac vessels.  He has triphasic common femoral waveforms.  The ABI 0.52 on the right and 0.95 left  Assessment: PVD with claudication Plan: The patient after his right external iliac angioplasty has had an improvement in his symptoms, however he still has significant lifestyle limitation with walking.  I discussed the pathophysiology of peripheral vascular disease and the indications for intervention.  His neck intervention would be subintimal recanalization and stenting of his superficial femoral artery occlusion on the right.  Before pursuing this, I want to make absolutely sure that his symptoms are lifestyle limiting and not something that he can tolerate.  He has a history of heart failure and therefore he is not a candidate for cilostazol, however I have given him a prescription for Trental to see if he has any improvement.  I told him to take it for 30 days.  He will continue it if it has made an improvement and discontinue it if it does not.  He did require  anesthesia for his catheterization.  This will need to be rearranged if we perform additional angiography.  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  This will need to be followed.  I will get an ultrasound in approximately 6 months.  He will follow up with me to discuss his claudication in 3 months.  He will not need diagnostic imaging for this visit.  Eldridge Abrahams, M.D. [facility] Office: [phone] Pager:  [phone]

## 2014-02-22 ENCOUNTER — Ambulatory Visit (INDEPENDENT_AMBULATORY_CARE_PROVIDER_SITE_OTHER): Payer: Medicare Other | Admitting: Adult Health

## 2014-02-22 VITALS — BP 128/86 | HR 92 | Temp 98.2°F | Ht 68.0 in | Wt 215.6 lb

## 2014-02-22 DIAGNOSIS — I509 Heart failure, unspecified: Secondary | ICD-10-CM

## 2014-02-22 DIAGNOSIS — J441 Chronic obstructive pulmonary disease with (acute) exacerbation: Secondary | ICD-10-CM

## 2014-02-22 DIAGNOSIS — I5023 Acute on chronic systolic (congestive) heart failure: Secondary | ICD-10-CM

## 2014-02-22 MED ORDER — PREDNISONE 10 MG PO TABS: 10.0000 mg | ORAL_TABLET | Freq: Every day | ORAL | Status: DC

## 2014-02-22 NOTE — Assessment & Plan Note (Signed)
Advised on lasix compliance and dietary /fluid restriction

## 2014-02-22 NOTE — Assessment & Plan Note (Signed)
Recurrent exacerbation of COPD complicated by uncontrolled CHF  W/ poor insight and compliance with diet /sodium intake and meds  Will use low dose steroids although difficult to determine if this will be helpful as aids in his fluid retention.  Counseled on diet /fluid restrictions along with complete smoking cessation  Plan  Decrease Prednisone 10mg  daily -hold at this dose until seen back in office  Use Claritin 10mg  daily As needed  Drainage.  Mucinex DM Twice daily   As needed  Cough/congestion  Continue on Dulera 2 puffs Twice daily  -rinse after use.  Use Combivent 1 puff Four times a day  .  Use Duoneb and ProAir as needed -this is your rescue medicine.  Please be careful and do not eat FOODS THAT ARE HIGH IN SODIUM THAT CAUSE YOU TO RETAIN FLUID .  NO SMOKING  Follow up Dr. Chase Caller in 2 weeks and As needed   Please contact office for sooner follow up if symptoms do not improve or worsen or seek emergency care

## 2014-02-22 NOTE — Patient Instructions (Signed)
Decrease Prednisone 10mg  daily -hold at this dose until seen back in office  Use Claritin 10mg  daily As needed  Drainage.  Mucinex DM Twice daily   As needed  Cough/congestion  Continue on Dulera 2 puffs Twice daily  -rinse after use.  Use Combivent 1 puff Four times a day  .  Use Duoneb and ProAir as needed -this is your rescue medicine.  Please be careful and do not eat FOODS THAT ARE HIGH IN SODIUM THAT CAUSE YOU TO RETAIN FLUID .  NO SMOKING  Follow up Dr. Chase [name] in 2 weeks and As needed   Please contact office for sooner follow up if symptoms do not improve or worsen or seek emergency care

## 2014-02-22 NOTE — Progress Notes (Signed)
Subjective:    Patient ID: Lucas [name], male    DOB: 1945/07/30, 68 y.o.   MRN: [id number]  HPI #Smoking  - quit sept 2014 but having withdrawals  #Moderate COPD Spirometry 04/18/12       06/02/12 FEV1 1.64L 46%             1.94  55%   FVC 2.78L 60%               3.01  65% FEV1/FVC 59                   64   #CHF/CAD - s/p CABG. C - Oct 3086 Systolic CHF admission ef 30% - Sept 5784: Diastolic CHF admission  - DEc 2013: EF 30% Admission for Unstable angiona but welll compensated CHF  #Obesity  - Body mass index is 33.15 kg/(m^2). on 08/31/2013 - Have been ruled out January 2015. Advised one liter of oxygen nocturnally    #AECOPD  - 03/26/13 - office Rx - 04/03/13 - AECOPD with Acute Diast CHF admission though 04/06/13 - Jan 2014 with doxy and pred in office with extended pred a week later  #imaging  - 04/03/13 CXR - CHF changes. NEver had CT -   OV 08/31/2013 Chief Complaint  Patient presents with  . COPD    follow-up. Pt states he has been having increased productive cough with yellow phlegm, increased SOB, chest congestion,  whezing and chest tightness x 3 days.   Followup COPD - Gold stage 2, DLCO 46% -  spet 2014,Full PFT   - At last visit mid January 2015 he had a COPD exacerbation with treated with doxycycline and prednisone. He improved after this. In the interim he had a cardiac cath this is listed below. However for the last 3 days is having increased cough, change in color of sputum, increasing wheeze and increased sputum volume. COPD cat score is in the 27s and reflects COPD exacerbation. He takes Combivent respimat which helps him but he does not take ANoro or breo because this does not help him. He is open to taking her steroids  New issue: He is open to having low-dose CT scan of the chest for lung cancer screening because Medicare is paying for it. However, our system not yet set up for this    Past, Family, Social reviewed: Since  last visit he has had  a cardiac catheterization that apparently was clean however, he does have right lower the claudication and apparently he has stenosis. He has a followup with cardiology pending. He is concerned that he might not be a revascularization candidate because of his COPD. In addition Dr. Elsworth Soho for sleep evaluation he tells me that he does not have sleep apnea; confirmed on electronic medical records dated 07/24/2013. His been advised one liters oxygen for sleep    #COPD exacerbation  - you are in flare up of copd again - Take prednisone 40 mg daily x 2 days, then 20mg  daily x 2 days, then 10mg  daily x 2 days, then 5mg  daily x 2 days and stop  take levaquin 500mg  once daily  X 6 days   #COPD Continue combivent respimat 4 times daily Start QVAR 60mcg, 2 puff twice daily Use albuterol 2 puff as needed No need for ANORO or BREO because this is not helping you Continue oxygen 18h/day  #Lung cancer screen  - do LDCT chest at next visit when we have systems in place  #preop  evaluation for vascular surgery  - I think you might be handle surgery for leg but at some risk; we will have to reassess this formally later  #FOllowuo Return to see my NP Taiylor Virden for med calendar - next several days to few weeks REturn to see me in 3 months  - spirometry at followujp      11/30/2013 Follow up  Returns for  3 month COPD follow up .  Reports is doing well.  Complains of hoarseness/sore throat .  Has been hospitalized 3x since last ov for CHF/SOB Most recent admit 5/6 for decompensated CHF w/ acute resp fail w/ hypoxia .  He was diuresis with decreased wt and swelling.  Followed closely by cards.  Says swelling is doing much better.   Currently on Dulera Twice daily  And Combivent Four times a day  And uses Duoneb As needed .  Remains on O2 at 2 l/m At bedtime  And with activity As needed   Denies any hemoptysis, fever, or orthopnea, PND, or increased leg swelling   OV 12/31/2013  Chief Complaint    Patient presents with  . Follow-up    Pt c/o dyspnea with exeriton and little ambulation. Pt states he had a blockage in his right calf and had a "balloon placed" last week. Pt c/o dry persistant cough and left chest pain with actvity.     Followup Gold stage II COPD and multiple medical problems  - COPD: Currently disease is stable. His inhaler regimen is weird but he wont change it. HE is on dulera and  combivent scheduled. Also on oi2 18h/day. Has class II dyspnea on exertion. Minimal cough only. His dyspnea is out of proportion to severity of copd and is associated with claudication but no chest pain  - Smoking: He thinks he quit smoking but he is actually smoking electronic cigarettes. I cautioned him that this is bad  - Lung cancer screening: Due to cost and insurance issues he has not had CT scan  - . CAT COPD Symptom & Quality of Life Score (GS[employer] trademark) 0 is no burden. 5 is highest burden 03/26/2013  04/23/2013  07/17/2013  08/31/2013 aecopd  Never Cough -> Cough all the time 4 2 3 5   No phlegm in chest -> Chest is full of phlegm 4 2 3 4   No chest tightness -> Chest feels very tight 3 2 3 4   No dyspnea for 1 flight stairs/hill -> Very dyspneic for 1 flight of stairs 4 4 4 4   No limitations for ADL at home -> Very limited with ADL at home 4 2 3 4   Confident leaving home -> Not at all confident leaving home 2 2 2 4   Sleep soundly -> Do not sleep soundly because of lung condition 4 3 3 4   Lots of Energy -> No energy at all 4 4 4 5   TOTAL Score (max 40)  29 21 25  34   Past medical history reviewed: He continues to have claudication despite balloon placement in his femoral artery according to his history. Dyspnea and claudication occurs at the same time. He also has chronic pain but does not have a pain clinic. Apparently in the hospital Dilaudid helped him and he wants this   01/29/2014 Acute OV  /Post hosp/ER  follow up  COPD , CHF Atrial Fib  Patient presents for a post  hospital followup. He was admitted June 28 through July 1 for COPD, exacerbation, and decompensated, acute on chronic congestive  heart failure. Patient was treated with IV antibiotics, steroids, and nebulized bronchodilators. Patient did have atypical chest pain. He underwent a CT chest angiogram that was negative for PE.  There were very small pulmonary nodules measuring up to 6 mm in the right middle and lower lobes. Patient was seen by cardiology and ruled out for acute MI and treated with medical management. Patient was treated with diuresis. Since discharge. Patient reports that he has continued to have cough, shortness, of breath and wheezing. He was called in doxycycline on July 6, along with a prednisone taper.  Patient was seen in the emergency room on July 14 and given prednisone taper. Patient says that he continues to have muscle cramping. He is currently on Lasix 40 mg daily He denies any hemoptysis, fever, chest pain, vomiting, diarrhea, bloody stools. He does complain of hoarseness.   02/22/2014 [facility] follow up  Returns for post hospital follow up .  Readmitted for COPD and CHF exacerbation on 7/23-28 tx w/ steroids and diuresis .  Continues to have dry cough, wheezing, DOE, PND, edema in abdomen.   Denies hemoptysis, f/c/s, n/v/d.  finished prednisone this morning.  Smoked last 3 weeks ago, we discussed smoking cessation. Uses e cig , we discussed dangers of these as well.  Wt is up today drink 2 Dr. Malachi Bonds and sausge biscuit and gravy. We discussed his salt intake.  More swollen and bloated today.  Followed by CHF clinic. Suppose to be on Lasix 40mg  in am and 20mg  in evening . But only takes 40mg  in evening.  On 2 l/m O2 At bedtime   We discussed diet and med compliance along with need for closer follow up to help keep out of ER and hospital .    Review of Systems  Constitutional: Negative for fever and unexpected weight change.  HENT: Negative for congestion, dental  problem, ear pain, nosebleeds, postnasal drip, rhinorrhea, sinus pressure, sneezing, sore throat and trouble swallowing.   Eyes: Negative for redness and itching.  Respiratory: Positive for cough and shortness of breath. Negative for chest tightness and wheezing.   Cardiovascular:  . Negative for palpitations and leg swelling.  Gastrointestinal: Negative for nausea and vomiting.  Genitourinary: Negative for dysuria.  Musculoskeletal: Negative for joint swelling.  Skin: Negative for rash.  Neurological: Negative for headaches.  Hematological: Does not bruise/bleed easily.  Psychiatric/Behavioral: Negative for dysphoric mood. The patient is not nervous/anxious.        Objective:   Physical Exam   HENT:  Head: Normocephalic and atraumatic.  Right Ear: External ear normal.  Left Ear: External ear normal.  Mouth/Throat: Oropharynx is clear and moist.  No thrush noted.  mallampatti class 3-4,Eyes: Conjunctivae and EOM are normal. Pupils are equal, round, and reactive to light. Right eye exhibits no discharge. Left eye exhibits no discharge. No scleral icterus.  Neck: Normal range of motion. Neck supple. No JVD present. No tracheal deviation present. No thyromegaly present.  Cardiovascular: Normal rate, regular rhythm and intact distal pulses.  Exam reveals no gallop and no friction rub.   No murmur heard.tr-1+ edema  Pulmonary/Chest: Effort normal. No respiratory distress, few trace rhonchi  He exhibits no tenderness.  Abdominal: Soft. Bowel sounds are normal. He exhibits no distension and no mass. There is no tenderness. There is no rebound and no guarding.  Musculoskeletal: Normal range of motion. Lymphadenopathy:    He has no cervical adenopathy.  Neurological: He is alert and oriented to person, place, and time. He  has normal reflexes. No cranial nerve deficit. Coordination normal.           Assessment & Plan:

## 2014-02-27 ENCOUNTER — Telehealth: Payer: Self-pay | Admitting: Internal Medicine

## 2014-02-27 MED ORDER — IPRATROPIUM-ALBUTEROL 20-100 MCG/ACT IN AERS: 1.0000 | INHALATION_SPRAY | Freq: Four times a day (QID) | RESPIRATORY_TRACT | Status: DC | PRN

## 2014-02-27 MED ORDER — ALBUTEROL SULFATE HFA 108 (90 BASE) MCG/ACT IN AERS: 2.0000 | INHALATION_SPRAY | Freq: Four times a day (QID) | RESPIRATORY_TRACT | Status: DC | PRN

## 2014-02-27 NOTE — Telephone Encounter (Signed)
Called and spoke with pt and he stated that he needed the combivent and the albuterol sent in to his pharmacy.  Pt is aware that this has been done and nothing further is needed.

## 2014-03-04 ENCOUNTER — Ambulatory Visit: Payer: Medicaid Other | Admitting: Surgery

## 2014-03-04 ENCOUNTER — Encounter (HOSPITAL_COMMUNITY): Payer: Medicaid Other

## 2014-03-07 ENCOUNTER — Ambulatory Visit (INDEPENDENT_AMBULATORY_CARE_PROVIDER_SITE_OTHER): Payer: PRIVATE HEALTH INSURANCE | Admitting: Internal Medicine

## 2014-03-07 ENCOUNTER — Encounter: Payer: Self-pay | Admitting: Internal Medicine

## 2014-03-07 VITALS — BP 142/68 | HR 87 | Ht 68.0 in | Wt 220.0 lb

## 2014-03-07 DIAGNOSIS — R911 Solitary pulmonary nodule: Secondary | ICD-10-CM

## 2014-03-07 DIAGNOSIS — J449 Chronic obstructive pulmonary disease, unspecified: Secondary | ICD-10-CM

## 2014-03-07 NOTE — Patient Instructions (Addendum)
#  COPD Currently disease is stable Continue combivent respimat 4 times daily Continue Dulera 2 puff twice daily Reduce prednisone to 5mg  daily and stay on it; will rreduce it further at next visit Will consider roflumilast to prevent flare ups at next visit Use duoneb as needed Continue oxygen 18h/day REspect your decision to refuse pneumonia vaccine Please have flu shot when available in community Take care of leg bypass - then you can do rehab  #Lung nodule RLL 16mm - June 2015 - do CT chest wo contrast in 9 months  #SMoking  -glad you quit   #FOllowup REturn to see NP in 1 month to discuss further prednisone taper

## 2014-03-07 NOTE — Progress Notes (Signed)
Subjective:    Patient ID: Lucas [name], male    DOB: 1945-10-02, 68 y.o.   MRN: [id number]  HPI  Smoking  - quit sept 2014 but having withdrawals   #Moderate COPD - normal alpha 1 GM 12/31/13 Spirometry 04/18/12       06/02/12 FEV1 1.64L 46%             1.94  55%   FVC 2.78L 60%               3.01  65% FEV1/FVC 59                   64  - somehwere in 2015 ended up on chronic prednisone; realized it only august 2015   #CHF/CAD - s/p CABG. C - Oct 9528 Systolic CHF admission ef 30% - Sept 4132: Diastolic CHF admission  - DEc 2013: EF 30% Admission for Unstable angiona but welll compensated CHF - hx of normal left heart cath - early 2015  -May 2015 and July 2015: Admit for decom CHF Readmitted for COPD and CHF exacerbation on 7/23-28 tx w/ steroids and diuresis .   #Obesity  - Body mass index is 33.15 kg/(m^2). on 08/31/2013 - Have been ruled out sleep apnea January 2015. Advised one liter of oxygen nocturnally    #AECOPD  - 03/26/13 - office Rx - 04/03/13 - AECOPD with Acute Diast CHF admission though 04/06/13 - Jan 2015 with doxy and pred in office with extended pred a week later -feb 2015: Opd rx with pred taper  and doxy - July 2015: pred taper and abx - July 20-15- admit Aecopd and aechf  Readmitted for COPD and CHF exacerbation on 7/23-28 tx w/ steroids and diuresis .    #imaging  - -      OV 03/07/2014  Chief Complaint  Patient presents with  . Follow-up    Pt c/o dry cough, DOE. Pt used his abuterol HFA in lobby today. Pt c/o chest pain this morning at 0200 that last 20 min. Pt states he is having surgery on his right leg.     Fu COPD, SMoking, Gold stage 2 with multiple medical problems inlcuding obesity, chronic pain and new lung nodules   COPD: Currently disease is stable. ADmmited 2 times in past 3 months for copd and chf flare ups. His inhaler regimen is weird but he wont change it. HE is on dulera and  combivent scheduled. Also on oi2 18h/day.  Has class II dyspnea on exertion. Minimal cough only. His dyspnea is out of proportion to severity of copd and is associated with claudication but no chest pain. TOday I find out he is on chronic prednisone at 10mg  per day; not sure how this happened but says been on it for 6 months. STill waiting on rehab. Movement restricted due to claudication and miight not be able to do rehab. ALpha  1 is GM and normal  - Smoking: He insists he quit smokin nov 2014 and not even smoking e cig  - Lung cancer screening - NEW Lung nodules  - new dx CT chest 01/07/14  - Moderate centrilobular emphysema. There are small scattered smooth  pulmonary nodules, most peripheral:  Right lower lobe, image 43, 6 mm.  Right lower lobe, image 40, 4 mm.  Right middle lobe, image 46, 2 nodules measuring 5 and 6 mm.  No consolidation, effusion, or pneumothorax. There is diffuse  bronchial wall thickening and mucoid impaction, likely also  smoking-related.    Past hx  - plans to have fem pop bypass due to severe claudication    - . CAT COPD Symptom & Quality of Life Score (GSK trademark) 0 is no burden. 5 is highest burden 03/26/2013  04/23/2013  07/17/2013  08/31/2013 aecopd  Never Cough -> Cough all the time 4 2 3 5   No phlegm in chest -> Chest is full of phlegm 4 2 3 4   No chest tightness -> Chest feels very tight 3 2 3 4   No dyspnea for 1 flight stairs/hill -> Very dyspneic for 1 flight of stairs 4 4 4 4   No limitations for ADL at home -> Very limited with ADL at home 4 2 3 4   Confident leaving home -> Not at all confident leaving home 2 2 2 4   Sleep soundly -> Do not sleep soundly because of lung condition 4 3 3 4   Lots of Energy -> No energy at all 4 4 4 5   TOTAL Score (max 40)  29 21 25  34    Review of Systems  Constitutional: Negative for fever and unexpected weight change.  HENT: Negative for congestion, dental problem, ear pain, nosebleeds, postnasal drip, rhinorrhea, sinus pressure, sneezing, sore  throat and trouble swallowing.   Eyes: Negative for redness and itching.  Respiratory: Positive for cough and shortness of breath. Negative for chest tightness and wheezing.   Cardiovascular: Positive for chest pain. Negative for palpitations and leg swelling.  Gastrointestinal: Negative for nausea and vomiting.  Genitourinary: Negative for dysuria.  Musculoskeletal: Negative for joint swelling.  Skin: Negative for rash.  Neurological: Negative for headaches.  Hematological: Does not bruise/bleed easily.  Psychiatric/Behavioral: Negative for dysphoric mood. The patient is not nervous/anxious.       Current outpatient prescriptions:albuterol (PROVENTIL HFA;VENTOLIN HFA) 108 (90 BASE) MCG/ACT inhaler, Inhale 2 puffs into the lungs every 6 (six) hours as needed for wheezing or shortness of breath. Shortness of breath, Disp: 1 Inhaler, Rfl: 5;  albuterol (PROVENTIL) (2.5 MG/3ML) 0.083% nebulizer solution, Take 2.5 mg by nebulization 4 (four) times daily., Disp: , Rfl:  ALPRAZolam (XANAX) 1 MG tablet, Take 1 tablet (1 mg total) by mouth 3 (three) times daily as needed for anxiety., Disp: 30 tablet, Rfl: 1;  apixaban (ELIQUIS) 5 MG TABS tablet, Take 1 tablet (5 mg total) by mouth 2 (two) times daily., Disp: 60 tablet, Rfl: 1;  aspirin EC 81 MG tablet, Take 81 mg by mouth daily., Disp: , Rfl: ;  doxazosin (CARDURA) 8 MG tablet, Take 8 mg by mouth at bedtime. , Disp: , Rfl:  furosemide (LASIX) 20 MG tablet, Take 1 tablet at 4 PM daily, Disp: 30 tablet, Rfl: 0;  furosemide (LASIX) 40 MG tablet, Take 1 tablet (40 mg total) by mouth daily before breakfast., Disp: 30 tablet, Rfl: 0;  HYDROcodone-acetaminophen (NORCO) 10-325 MG per tablet, Take 1 tablet by mouth 5 (five) times daily., Disp: , Rfl: ;  HYDROmorphone (DILAUDID) 4 MG tablet, Take 1 tablet (4 mg total) by mouth at bedtime., Disp: 40 tablet, Rfl: 0 ipratropium (ATROVENT) 0.02 % nebulizer solution, Take 0.5 mg by nebulization 4 (four) times daily.,  Disp: , Rfl: ;  Ipratropium-Albuterol (COMBIVENT RESPIMAT) 20-100 MCG/ACT AERS respimat, Inhale 1 puff into the lungs every 6 (six) hours as needed for wheezing or shortness of breath., Disp: 1 Inhaler, Rfl: 5;  isosorbide mononitrate (IMDUR) 30 MG 24 hr tablet, Take 1 tablet (30 mg total) by mouth daily., Disp: 30 tablet, Rfl: 0  mometasone-formoterol (DULERA) 100-5 MCG/ACT AERO, Inhale 2 puffs into the lungs 2 (two) times daily., Disp: 1 Inhaler, Rfl: 0;  NITROSTAT 0.3 MG SL tablet, Place 0.3 mg under the tongue every 5 (five) minutes as needed for chest pain. , Disp: , Rfl: ;  NON FORMULARY, Place 2 L into the nose at bedtime. O2 at night only, Disp: , Rfl:  predniSONE (DELTASONE) 10 MG tablet, Take 1 tablet (10 mg total) by mouth daily with breakfast., Disp: 30 tablet, Rfl: 0;  valsartan (DIOVAN) 40 MG tablet, Take 1 tablet by mouth daily., Disp: , Rfl: ;  benzonatate (TESSALON) 100 MG capsule, Take 1 capsule (100 mg total) by mouth 3 (three) times daily as needed for cough., Disp: 20 capsule, Rfl: 0 nicotine (NICODERM CQ - DOSED IN MG/24 HOURS) 14 mg/24hr patch, Place 1 patch (14 mg total) onto the skin daily., Disp: 28 patch, Rfl: 0  Objective:   Physical Exam  Filed Vitals:   03/07/14 0901  Height: 5\' 8"  (1.727 m)  Weight: 220 lb (99.791 kg)   Filed Vitals:   03/07/14 0901  BP: 142/68  Pulse: 87  Height: 5\' 8"  (1.727 m)  Weight: 220 lb (99.791 kg)  SpO2: 95%    HENT:  Head: Normocephalic and atraumatic.  Right Ear: External ear normal.  Left Ear: External ear normal.  Mouth/Throat: Oropharynx is clear and moist. No oropharyngeal exudate.  mallampatti class 4, STIFF NECK DUE TO NECK SURGEERY IN PAST  Eyes: Conjunctivae and EOM are normal. Pupils are equal, round, and reactive to light. Right eye exhibits no discharge. Left eye exhibits no discharge. No scleral icterus.  Neck: Normal range of motion. Neck supple. No JVD present. No tracheal deviation present. No thyromegaly present.    Cardiovascular: Normal rate, regular rhythm and intact distal pulses.  Exam reveals no gallop and no friction rub.   No murmur heard. Pulmonary/Chest: Effort normal. No respiratory distress. He has NO WHEEZE THIS VISIT He has no rales. He exhibits no tenderness.  Abdominal: Soft. Bowel sounds are normal. He exhibits no distension and no mass. There is no tenderness. There is no rebound and no guarding.  Musculoskeletal: Normal range of motion. He exhibits no edema and no tenderness.  Lymphadenopathy:    He has no cervical adenopathy.  Neurological: He is alert and oriented to person, place, and time. He has normal reflexes. No cranial nerve deficit. Coordination normal.          Assessment & Plan:  #COPD Currently disease is stable Continue combivent respimat 4 times daily Continue Dulera 2 puff twice daily Reduce prednisone to 5mg  daily and stay on it; will rreduce it further at next visit Will consider roflumilast to prevent flare ups at next visit Use duoneb as needed Continue oxygen 18h/day REspect your decision to refuse pneumonia vaccine Please have flu shot when available in community Take care of leg bypass - then you can do rehab  #Lung nodule RLL 54mm - June 2015 - do CT chest wo contrast in 9 months  #SMoking  -glad you quit   #FOllowup REturn to see NP in 1 month to discuss further prednisone taper

## 2014-03-08 ENCOUNTER — Other Ambulatory Visit: Payer: Self-pay | Admitting: Nurse Practitioner

## 2014-03-08 ENCOUNTER — Encounter: Payer: Self-pay | Admitting: Nurse Practitioner

## 2014-03-08 ENCOUNTER — Encounter (HOSPITAL_COMMUNITY): Payer: Self-pay | Admitting: Nurse Practitioner

## 2014-03-08 ENCOUNTER — Inpatient Hospital Stay (HOSPITAL_COMMUNITY)
Admission: AD | Admit: 2014-03-08 | Discharge: 2014-03-11 | DRG: 292 | Disposition: A | Discharge status: Home / Self Care | Payer: PRIVATE HEALTH INSURANCE | Source: Ambulatory Visit | Attending: Cardiovascular Disease | Admitting: Cardiovascular Disease

## 2014-03-08 ENCOUNTER — Ambulatory Visit (INDEPENDENT_AMBULATORY_CARE_PROVIDER_SITE_OTHER): Payer: Medicare Other | Admitting: Nurse Practitioner

## 2014-03-08 VITALS — BP 180/90 | HR 82 | Ht 69.0 in | Wt 218.0 lb

## 2014-03-08 DIAGNOSIS — Z7982 Long term (current) use of aspirin: Secondary | ICD-10-CM

## 2014-03-08 DIAGNOSIS — I10 Essential (primary) hypertension: Secondary | ICD-10-CM

## 2014-03-08 DIAGNOSIS — I5022 Chronic systolic (congestive) heart failure: Secondary | ICD-10-CM

## 2014-03-08 DIAGNOSIS — I2510 Atherosclerotic heart disease of native coronary artery without angina pectoris: Secondary | ICD-10-CM | POA: Diagnosis present

## 2014-03-08 DIAGNOSIS — I2589 Other forms of chronic ischemic heart disease: Secondary | ICD-10-CM | POA: Diagnosis present

## 2014-03-08 DIAGNOSIS — Z9079 Acquired absence of other genital organ(s): Secondary | ICD-10-CM

## 2014-03-08 DIAGNOSIS — F17200 Nicotine dependence, unspecified, uncomplicated: Secondary | ICD-10-CM

## 2014-03-08 DIAGNOSIS — N183 Chronic kidney disease, stage 3 unspecified: Secondary | ICD-10-CM | POA: Diagnosis present

## 2014-03-08 DIAGNOSIS — J449 Chronic obstructive pulmonary disease, unspecified: Secondary | ICD-10-CM | POA: Diagnosis present

## 2014-03-08 DIAGNOSIS — J441 Chronic obstructive pulmonary disease with (acute) exacerbation: Secondary | ICD-10-CM

## 2014-03-08 DIAGNOSIS — I739 Peripheral vascular disease, unspecified: Secondary | ICD-10-CM

## 2014-03-08 DIAGNOSIS — J9610 Chronic respiratory failure, unspecified whether with hypoxia or hypercapnia: Secondary | ICD-10-CM

## 2014-03-08 DIAGNOSIS — J4489 Other specified chronic obstructive pulmonary disease: Secondary | ICD-10-CM | POA: Diagnosis present

## 2014-03-08 DIAGNOSIS — I4891 Unspecified atrial fibrillation: Secondary | ICD-10-CM | POA: Diagnosis present

## 2014-03-08 DIAGNOSIS — Z9861 Coronary angioplasty status: Secondary | ICD-10-CM

## 2014-03-08 DIAGNOSIS — I509 Heart failure, unspecified: Secondary | ICD-10-CM | POA: Diagnosis present

## 2014-03-08 DIAGNOSIS — I129 Hypertensive chronic kidney disease with stage 1 through stage 4 chronic kidney disease, or unspecified chronic kidney disease: Secondary | ICD-10-CM | POA: Diagnosis present

## 2014-03-08 DIAGNOSIS — I5021 Acute systolic (congestive) heart failure: Secondary | ICD-10-CM | POA: Diagnosis present

## 2014-03-08 DIAGNOSIS — Z9981 Dependence on supplemental oxygen: Secondary | ICD-10-CM

## 2014-03-08 DIAGNOSIS — R079 Chest pain, unspecified: Secondary | ICD-10-CM

## 2014-03-08 DIAGNOSIS — G8929 Other chronic pain: Secondary | ICD-10-CM

## 2014-03-08 DIAGNOSIS — R918 Other nonspecific abnormal finding of lung field: Secondary | ICD-10-CM | POA: Diagnosis present

## 2014-03-08 DIAGNOSIS — I5023 Acute on chronic systolic (congestive) heart failure: Principal | ICD-10-CM | POA: Diagnosis present

## 2014-03-08 DIAGNOSIS — Z85528 Personal history of other malignant neoplasm of kidney: Secondary | ICD-10-CM

## 2014-03-08 DIAGNOSIS — Z8546 Personal history of malignant neoplasm of prostate: Secondary | ICD-10-CM

## 2014-03-08 DIAGNOSIS — Z951 Presence of aortocoronary bypass graft: Secondary | ICD-10-CM

## 2014-03-08 DIAGNOSIS — F411 Generalized anxiety disorder: Secondary | ICD-10-CM | POA: Diagnosis present

## 2014-03-08 DIAGNOSIS — Z905 Acquired absence of kidney: Secondary | ICD-10-CM

## 2014-03-08 DIAGNOSIS — E785 Hyperlipidemia, unspecified: Secondary | ICD-10-CM | POA: Diagnosis present

## 2014-03-08 DIAGNOSIS — Z7901 Long term (current) use of anticoagulants: Secondary | ICD-10-CM

## 2014-03-08 LAB — COMPREHENSIVE METABOLIC PANEL
ALT: 26 U/L (ref 0–53)
AST: 23 U/L (ref 0–37)
Albumin: 3.6 g/dL (ref 3.5–5.2)
Alkaline Phosphatase: 44 U/L (ref 39–117)
Anion gap: 16 — ABNORMAL HIGH (ref 5–15)
BUN: 31 mg/dL — ABNORMAL HIGH (ref 6–23)
CO2: 23 mEq/L (ref 19–32)
Calcium: 9.1 mg/dL (ref 8.4–10.5)
Chloride: 102 mEq/L (ref 96–112)
Creatinine, Ser: 1.33 mg/dL (ref 0.50–1.35)
GFR calc Af Amer: 62 mL/min — ABNORMAL LOW (ref >90)
GFR calc non Af Amer: 53 mL/min — ABNORMAL LOW (ref >90)
Glucose, Bld: 79 mg/dL (ref 70–99)
Potassium: 3.7 mEq/L (ref 3.7–5.3)
Sodium: 141 mEq/L (ref 137–147)
Total Bilirubin: 0.2 mg/dL — ABNORMAL LOW (ref 0.3–1.2)
Total Protein: 6.8 g/dL (ref 6.0–8.3)

## 2014-03-08 LAB — CBC WITH DIFFERENTIAL/PLATELET
Basophils Absolute: 0 10*3/uL (ref 0.0–0.1)
Basophils Relative: 0 % (ref 0–1)
Eosinophils Absolute: 0.1 10*3/uL (ref 0.0–0.7)
Eosinophils Relative: 1 % (ref 0–5)
HCT: 42.1 % (ref 39.0–52.0)
Hemoglobin: 14.2 g/dL (ref 13.0–17.0)
Lymphocytes Relative: 15 % (ref 12–46)
Lymphs Abs: 1.5 10*3/uL (ref 0.7–4.0)
MCH: 30.8 pg (ref 26.0–34.0)
MCHC: 33.7 g/dL (ref 30.0–36.0)
MCV: 91.3 fL (ref 78.0–100.0)
Monocytes Absolute: 0.8 10*3/uL (ref 0.1–1.0)
Monocytes Relative: 8 % (ref 3–12)
Neutro Abs: 7.7 10*3/uL (ref 1.7–7.7)
Neutrophils Relative %: 76 % (ref 43–77)
Platelets: 159 10*3/uL (ref 150–400)
RBC: 4.61 MIL/uL (ref 4.22–5.81)
RDW: 16.4 % — ABNORMAL HIGH (ref 11.5–15.5)
WBC: 10.1 10*3/uL (ref 4.0–10.5)

## 2014-03-08 LAB — PRO B NATRIURETIC PEPTIDE: Pro B Natriuretic peptide (BNP): 795.4 pg/mL — ABNORMAL HIGH (ref 0–125)

## 2014-03-08 LAB — TROPONIN I
Troponin I: <0.3 ng/mL (ref <0.30)
Troponin I: <0.3 ng/mL (ref <0.30)
Troponin I: <0.3 ng/mL (ref <0.30)

## 2014-03-08 LAB — APTT: aPTT: 26 seconds (ref 24–37)

## 2014-03-08 LAB — TSH: TSH: 1.28 u[IU]/mL (ref 0.350–4.500)

## 2014-03-08 LAB — PROTIME-INR
INR: 1.12 (ref 0.00–1.49)
Prothrombin Time: 14.4 seconds (ref 11.6–15.2)

## 2014-03-08 MED ORDER — ALBUTEROL SULFATE (2.5 MG/3ML) 0.083% IN NEBU
2.5000 mg | INHALATION_SOLUTION | Freq: Four times a day (QID) | RESPIRATORY_TRACT | Status: DC
  Administered 2014-03-08 (×2): 2.5 mg via RESPIRATORY_TRACT
  Filled 2014-03-08 (×3): qty 3

## 2014-03-08 MED ORDER — DOXAZOSIN MESYLATE 8 MG PO TABS
8.0000 mg | ORAL_TABLET | Freq: Every day | ORAL | Status: DC
  Filled 2014-03-08: qty 1

## 2014-03-08 MED ORDER — ALBUTEROL SULFATE (2.5 MG/3ML) 0.083% IN NEBU: 3.0000 mL | INHALATION_SOLUTION | Freq: Four times a day (QID) | RESPIRATORY_TRACT | Status: DC | PRN

## 2014-03-08 MED ORDER — NICOTINE 14 MG/24HR TD PT24
14.0000 mg | MEDICATED_PATCH | Freq: Every day | TRANSDERMAL | Status: DC
  Administered 2014-03-08 – 2014-03-11 (×4): 14 mg via TRANSDERMAL
  Filled 2014-03-08 (×4): qty 1

## 2014-03-08 MED ORDER — ISOSORBIDE MONONITRATE ER 60 MG PO TB24
60.0000 mg | ORAL_TABLET | Freq: Every day | ORAL | Status: DC
  Administered 2014-03-10 – 2014-03-11 (×2): 60 mg via ORAL
  Filled 2014-03-08 (×4): qty 1

## 2014-03-08 MED ORDER — ONDANSETRON HCL 4 MG/2ML IJ SOLN
4.0000 mg | Freq: Four times a day (QID) | INTRAMUSCULAR | Status: DC | PRN
  Administered 2014-03-08: 4 mg via INTRAVENOUS
  Filled 2014-03-08: qty 2

## 2014-03-08 MED ORDER — DOXAZOSIN MESYLATE 8 MG PO TABS
8.0000 mg | ORAL_TABLET | Freq: Every day | ORAL | Status: DC
  Administered 2014-03-08 – 2014-03-10 (×3): 8 mg via ORAL
  Filled 2014-03-08 (×5): qty 1

## 2014-03-08 MED ORDER — ASPIRIN EC 81 MG PO TBEC
81.0000 mg | DELAYED_RELEASE_TABLET | Freq: Every day | ORAL | Status: DC
  Administered 2014-03-08 – 2014-03-11 (×4): 81 mg via ORAL
  Filled 2014-03-08 (×4): qty 1

## 2014-03-08 MED ORDER — FUROSEMIDE 10 MG/ML IJ SOLN
60.0000 mg | Freq: Two times a day (BID) | INTRAMUSCULAR | Status: DC
Start: 2014-03-08 — End: 2014-03-09
  Administered 2014-03-08 – 2014-03-09 (×2): 60 mg via INTRAVENOUS
  Filled 2014-03-08 (×4): qty 6

## 2014-03-08 MED ORDER — APIXABAN 5 MG PO TABS
5.0000 mg | ORAL_TABLET | Freq: Two times a day (BID) | ORAL | Status: DC
  Administered 2014-03-08 – 2014-03-11 (×7): 5 mg via ORAL
  Filled 2014-03-08 (×9): qty 1

## 2014-03-08 MED ORDER — HYDROMORPHONE HCL 2 MG PO TABS
4.0000 mg | ORAL_TABLET | Freq: Four times a day (QID) | ORAL | Status: DC | PRN
  Administered 2014-03-08 – 2014-03-09 (×3): 4 mg via ORAL
  Filled 2014-03-08 (×3): qty 2

## 2014-03-08 MED ORDER — IPRATROPIUM BROMIDE 0.02 % IN SOLN
0.5000 mg | Freq: Four times a day (QID) | RESPIRATORY_TRACT | Status: DC
  Administered 2014-03-08 (×3): 0.5 mg via RESPIRATORY_TRACT
  Filled 2014-03-08 (×4): qty 2.5

## 2014-03-08 MED ORDER — HYDROMORPHONE HCL 2 MG PO TABS: 4.0000 mg | ORAL_TABLET | Freq: Every day | ORAL | Status: DC

## 2014-03-08 MED ORDER — IRBESARTAN 75 MG PO TABS
75.0000 mg | ORAL_TABLET | Freq: Every day | ORAL | Status: DC
  Administered 2014-03-08 – 2014-03-11 (×4): 75 mg via ORAL
  Filled 2014-03-08 (×4): qty 1

## 2014-03-08 MED ORDER — HYDROCODONE-ACETAMINOPHEN 10-325 MG PO TABS
1.0000 | ORAL_TABLET | Freq: Four times a day (QID) | ORAL | Status: DC
  Administered 2014-03-08 – 2014-03-09 (×5): 1 via ORAL
  Filled 2014-03-08 (×4): qty 1

## 2014-03-08 MED ORDER — ALBUTEROL SULFATE (2.5 MG/3ML) 0.083% IN NEBU
2.5000 mg | INHALATION_SOLUTION | Freq: Four times a day (QID) | RESPIRATORY_TRACT | Status: DC
  Administered 2014-03-08: 2.5 mg via RESPIRATORY_TRACT
  Filled 2014-03-08: qty 3

## 2014-03-08 MED ORDER — SODIUM CHLORIDE 0.9 % IJ SOLN
3.0000 mL | Freq: Two times a day (BID) | INTRAMUSCULAR | Status: DC
  Administered 2014-03-08 – 2014-03-11 (×7): 3 mL via INTRAVENOUS

## 2014-03-08 MED ORDER — SODIUM CHLORIDE 0.9 % IJ SOLN: 3.0000 mL | INTRAMUSCULAR | Status: DC | PRN

## 2014-03-08 MED ORDER — ALPRAZOLAM 0.5 MG PO TABS
1.0000 mg | ORAL_TABLET | Freq: Three times a day (TID) | ORAL | Status: DC | PRN
  Administered 2014-03-08 – 2014-03-11 (×7): 1 mg via ORAL
  Filled 2014-03-08 (×7): qty 2

## 2014-03-08 MED ORDER — ACETAMINOPHEN 325 MG PO TABS: 650.0000 mg | ORAL_TABLET | ORAL | Status: DC | PRN

## 2014-03-08 MED ORDER — IPRATROPIUM-ALBUTEROL 0.5-2.5 (3) MG/3ML IN SOLN: 3.0000 mL | Freq: Four times a day (QID) | RESPIRATORY_TRACT | Status: DC | PRN

## 2014-03-08 MED ORDER — MOMETASONE FURO-FORMOTEROL FUM 100-5 MCG/ACT IN AERO
2.0000 | INHALATION_SPRAY | Freq: Two times a day (BID) | RESPIRATORY_TRACT | Status: DC
  Administered 2014-03-08 – 2014-03-11 (×5): 2 via RESPIRATORY_TRACT
  Filled 2014-03-08: qty 8.8

## 2014-03-08 MED ORDER — PREDNISONE 10 MG PO TABS
10.0000 mg | ORAL_TABLET | Freq: Every day | ORAL | Status: DC
  Administered 2014-03-09 – 2014-03-11 (×3): 10 mg via ORAL
  Filled 2014-03-08 (×5): qty 1

## 2014-03-08 MED ORDER — IPRATROPIUM-ALBUTEROL 20-100 MCG/ACT IN AERS: 1.0000 | INHALATION_SPRAY | Freq: Four times a day (QID) | RESPIRATORY_TRACT | Status: DC | PRN

## 2014-03-08 MED ORDER — SODIUM CHLORIDE 0.9 % IV SOLN: 250.0000 mL | INTRAVENOUS | Status: DC | PRN

## 2014-03-08 NOTE — Progress Notes (Signed)
[name] Date of Birth: 17-Jul-1945 Medical Record #[id number]  History of Present Illness: Mr. Beever is seen back today for a post hospital visit. Seen for Dr. Johnsie Cancel. He is a chronically ill male with multiple issues. These include COPD with chronic respiratory failure on home oxygen at night, chronic systolic HF with EF of 40 to 45%, remote renal cancer with past nephrectomy with CKD stage III, CAD with past CABG with his LIMA to the LAD patent and the SVG to OM3 but all other SVGs remain occluded. His other issues include anxiety, PAF and long standing tobacco abuse, PAD with past angioplasty in June of 2015 and chronic pain. Numerous hospitalizations. He is now on chronic anticoagulation but has had issues in the past with affording the Eliquis.   I have seen him several times - he is trying to have surgical revascularization for his PVD - felt to be a poor candidate from our standpoint.   Just discharged a month ago with recurrent COPD exacerbation/HF.   Here today. Here alone. Has not done well for the last 3 days ago. Started holding on to fluid. Has been eating out recently - did not know that that had salt. Has had chest pain - almost called 911 last night. Chest pain lasted about 15 minutes. Some discomfort this morning but not right now. Weight is up. No current pain. Had to walk a considerable distance. More swelling. More bloating.   Current Outpatient Prescriptions  Medication Sig Dispense Refill  . albuterol (PROVENTIL HFA;VENTOLIN HFA) 108 (90 BASE) MCG/ACT inhaler Inhale 2 puffs into the lungs every 6 (six) hours as needed for wheezing or shortness of breath. Shortness of breath  1 Inhaler  5  . albuterol (PROVENTIL) (2.5 MG/3ML) 0.083% nebulizer solution Take 2.5 mg by nebulization 4 (four) times daily.      Marland Kitchen ALPRAZolam (XANAX) 1 MG tablet Take 1 tablet (1 mg total) by mouth 3 (three) times daily as needed for anxiety.  30 tablet  1  . apixaban (ELIQUIS) 5 MG TABS  tablet Take 1 tablet (5 mg total) by mouth 2 (two) times daily.  60 tablet  1  . aspirin EC 81 MG tablet Take 81 mg by mouth daily.      . benzonatate (TESSALON) 100 MG capsule Take 1 capsule (100 mg total) by mouth 3 (three) times daily as needed for cough.  20 capsule  0  . doxazosin (CARDURA) 8 MG tablet Take 8 mg by mouth at bedtime.       . furosemide (LASIX) 20 MG tablet Take 1 tablet at 4 PM daily  30 tablet  0  . furosemide (LASIX) 40 MG tablet Take 1 tablet (40 mg total) by mouth daily before breakfast.  30 tablet  0  . HYDROcodone-acetaminophen (NORCO) 10-325 MG per tablet Take 1 tablet by mouth 5 (five) times daily.      Marland Kitchen HYDROmorphone (DILAUDID) 4 MG tablet Take 1 tablet (4 mg total) by mouth at bedtime.  40 tablet  0  . ipratropium (ATROVENT) 0.02 % nebulizer solution Take 0.5 mg by nebulization 4 (four) times daily.      . Ipratropium-Albuterol (COMBIVENT RESPIMAT) 20-100 MCG/ACT AERS respimat Inhale 1 puff into the lungs every 6 (six) hours as needed for wheezing or shortness of breath.  1 Inhaler  5  . isosorbide mononitrate (IMDUR) 30 MG 24 hr tablet Take 1 tablet (30 mg total) by mouth daily.  30 tablet  0  . mometasone-formoterol (  DULERA) 100-5 MCG/ACT AERO Inhale 2 puffs into the lungs 2 (two) times daily.  1 Inhaler  0  . nicotine (NICODERM CQ - DOSED IN MG/24 HOURS) 14 mg/24hr patch Place 1 patch (14 mg total) onto the skin daily.  28 patch  0  . NITROSTAT 0.3 MG SL tablet Place 0.3 mg under the tongue every 5 (five) minutes as needed for chest pain.       . NON FORMULARY Place 2 L into the nose at bedtime. O2 at night only      . predniSONE (DELTASONE) 10 MG tablet Take 1 tablet (10 mg total) by mouth daily with breakfast.  30 tablet  0  . valsartan (DIOVAN) 40 MG tablet Take 1 tablet by mouth daily.       No current facility-administered medications for this visit.    Allergies  Allergen Reactions  . Ativan [Lorazepam] Other (See Comments)    Increases agitation,  tolerates xanax  . Lisinopril Cough  . Morphine And Related     Hallucinations, too sedated when given with ativan  . Ibuprofen Hives, Nausea And Vomiting and Rash    Tolerates baby aspirin per patient.      Past Medical History  Diagnosis Date  . CAD (coronary artery disease)     a. s/p CABG x 5;  b. 04/2012 Cath: patent LIMA->LAD and VG->OM3, all other grafts occluded. c. Cath 12/2013: patent SVG-OM3 & LIMA-LAD, known occ other VG.  Marland Kitchen Hypertension   . Anxiety   . Adjustment disorder with anxiety   . Hyperlipidemia     a. Unable to take statins.  Marland Kitchen BPH (benign prostatic hypertrophy)   . COPD (chronic obstructive pulmonary disease)     a. on home O2.  . Arthritis   . Ischemic cardiomyopathy     a. 06/2013 Echo: EF 30-35%, no reg wma's, Gr2 DD, triv AI, mod dil LA, nl RV fxn.  . Chronic systolic CHF (congestive heart failure)     a. 06/2013 Echo: EF 30-35%.  . Prostate cancer dx'd 2012  . Renal cancer dx'd 1997    lt nephrectomy  . CKD (chronic kidney disease), stage III   . Tobacco abuse     a. 23 yr hx as of 2015, transitioned to e-cig.  Marland Kitchen Syncope     a. 08/2013: the day following cath, no injury, had received multiple doses of Versed for anxiety - this was felt to be a contributing factor.  Marland Kitchen PAF (paroxysmal atrial fibrillation)   . Hypotension     a. H/o soft BP prohibiting ACEI/ARB use.  . Chronic pain   . PAD (peripheral artery disease)     a. 12/2013: PV angio s/p angioplasty to R external iliac artery stenosis, also has R SFA occlusion with reconstitution of above-knee popliteal artery, 2 vessel runoff via peroneal and posterior tib.  . Pulmonary nodules     a. 12/2013:  small pulmonary nodules measuring up to 107mm in RM/LL, f/u recommended 6-12 months.    Past Surgical History  Procedure Laterality Date  . Coronary artery bypass graft      x 5  . Cardiac catheterization    . Nephrectomy      left nephrectomy for ca  . Posterior cervical laminectomy      x 8    limited ROM  and can't lie flat  . Heart stents      x 5  . Robot assisted laparoscopic radical prostatectomy  for prostate cancer  . Cystoscopy with litholapaxy  05/01/2012    Procedure: CYSTOSCOPY WITH LITHOLAPAXY;  Surgeon: Dutch Gray, MD;  Location: [facility];  Service: Urology;  Laterality: N/A;  . Prostate cancer    . Kidney surgery Left 1994    History  Smoking status  . Former Smoker -- 0.30 packs/day for 54 years  . Types: Cigarettes  . Quit date: 02/04/2014  Smokeless tobacco  . Former Systems developer  . Quit date: 04/19/2012    Comment: Using e-cig now    History  Alcohol Use No    Family History  Problem Relation Age of Onset  . Diabetes Mother   . Heart disease Mother   . Hypertension Mother   . Heart attack Mother   . Coronary artery disease    . Heart disease Father   . Diabetes Father   . Heart attack Father   . Heart disease Brother   . Heart attack Brother   . COPD Sister     Review of Systems: The review of systems is per the HPI.  All other systems were reviewed and are negative.  Physical Exam: BP 180/90  Pulse 82  Ht 5\' 9"  (1.753 m)  Wt 218 lb (98.884 kg)  BMI 32.18 kg/m2  SpO2 88% Patient is chronically ill and in mild distress. He smells of tobacco. He is short of breath with sitting here and talking. Weight is up. Skin is warm and dry. Color is normal.  HEENT is unremarkable. Normocephalic/atraumatic. PERRL. Sclera are nonicteric. Neck is supple. No masses. No JVD. Lungs show decreased breath sounds. No wheezing noted. Cardiac exam shows a regular rate and rhythm. Occasional ectopic. Abdomen is soft. Extremities are with 1+edema. Gait and ROM are intact. No gross neurologic deficits noted.  Wt Readings from Last 3 Encounters:  03/08/14 218 lb (98.884 kg)  03/07/14 220 lb (99.791 kg)  02/22/14 215 lb 9.6 oz (97.796 kg)    LABORATORY DATA/PROCEDURES:  Lab Results  Component Value Date   WBC 14.4* 02/02/2014   HGB 12.5* 02/02/2014   HCT 39.4  02/02/2014   PLT 218 02/02/2014   GLUCOSE 136* 02/05/2014   CHOL 208* 07/01/2013   TRIG 144 07/01/2013   HDL 34* 07/01/2013   LDLCALC 145* 07/01/2013   ALT 14 01/07/2014   AST 23 01/07/2014   NA 139 02/05/2014   K 4.3 02/05/2014   CL 99 02/05/2014   CREATININE 1.41* 02/05/2014   BUN 48* 02/05/2014   CO2 28 02/05/2014   TSH 1.729 07/01/2013   INR 1.17 12/25/2013   HGBA1C 6.1* 07/01/2013    BNP (last 3 results)  Recent Labs  01/07/14 0005 01/29/14 0945 01/31/14 1724  PROBNP 1118.0* 203.0* 1616.0*   Echo Study Conclusions from April 2015  - Left ventricle: The cavity size was normal. Wall thickness was increased in a pattern of moderate LVH. Systolic function was mildly to moderately reduced. The estimated ejection fraction was in the range of 40% to 45%. There is hypokinesis of the mid-distalanteroseptal myocardium. Left ventricular diastolic function parameters were normal. - Aortic valve: Trivial regurgitation. - Mitral valve: Calcified annulus. - Left atrium: The atrium was mildly dilated. - Right atrium: The atrium was mildly dilated.    Assessment / Plan: 1. Chronic respiratory failure -  O2 sat 88% here today.   2. Chronic systolic HF - EF of 40 to 45% -  Has weight gain, swelling on exam, etc.   3. Chest pain - known CAD  4. Tobacco abuse  5. HTN - BP sky high.   6. PVD  Will proceed with admission to rule out MI, diurese, etc. Treat BP. Do not feel that we will be able to turn this around at home. Further disposition to follow. He has had an echo just 4 months ago - will not repeat for now. Probably needs social work to see for disposition due to numerous admissions. Has bed available on 3E.  Discussed with Dr. Ron Parker who is in agreement.   Patient is agreeable to this plan and will call if any problems develop in the interim.   Burtis Junes, RN, [facility] [address] [place], [place]  [phone]

## 2014-03-08 NOTE — H&P (Addendum)
Lucas [name]  Date of Birth: 04-11-1946  Medical Record #[id number]  History of Present Illness:  Lucas [name] is seen back today for a post hospital visit. Seen for Dr. Johnsie Cancel. Lucas [name] is a chronically ill male with multiple issues. These include COPD with chronic respiratory failure on home oxygen at night, chronic systolic HF with EF of 40 to 45%, remote renal cancer with past nephrectomy with CKD stage III, CAD with past CABG with his LIMA to the LAD patent and the SVG to OM3 but all other SVGs remain occluded. His other issues include anxiety, PAF and long standing tobacco abuse, PAD with past angioplasty in June of 2015 and chronic pain. Numerous hospitalizations. Lucas [name] is now on chronic anticoagulation but has had issues in the past with affording the Eliquis.  I have seen him several times - Lucas [name] is trying to have surgical revascularization for his PVD - felt to be a poor candidate from our standpoint.  Just discharged a month ago with recurrent COPD exacerbation/HF.  Here today. Here alone. Has not done well for the last 3 days ago. Started holding on to fluid. Has been eating out recently - did not know that that had salt. Has had chest pain - almost called 911 last night. Chest pain lasted about 15 minutes. Some discomfort this morning but not right now. Weight is up. No current pain. Had to walk a considerable distance. More swelling. More bloating.  Current Outpatient Prescriptions   Medication  Sig  Dispense  Refill   .  albuterol (PROVENTIL HFA;VENTOLIN HFA) 108 (90 BASE) MCG/ACT inhaler  Inhale 2 puffs into the lungs every 6 (six) hours as needed for wheezing or shortness of breath. Shortness of breath  1 Inhaler  5   .  albuterol (PROVENTIL) (2.5 MG/3ML) 0.083% nebulizer solution  Take 2.5 mg by nebulization 4 (four) times daily.     Marland Kitchen  ALPRAZolam (XANAX) 1 MG tablet  Take 1 tablet (1 mg total) by mouth 3 (three) times daily as needed for anxiety.  30 tablet  1   .  apixaban (ELIQUIS) 5 MG TABS  tablet  Take 1 tablet (5 mg total) by mouth 2 (two) times daily.  60 tablet  1   .  aspirin EC 81 MG tablet  Take 81 mg by mouth daily.     .  benzonatate (TESSALON) 100 MG capsule  Take 1 capsule (100 mg total) by mouth 3 (three) times daily as needed for cough.  20 capsule  0   .  doxazosin (CARDURA) 8 MG tablet  Take 8 mg by mouth at bedtime.     .  furosemide (LASIX) 20 MG tablet  Take 1 tablet at 4 PM daily  30 tablet  0   .  furosemide (LASIX) 40 MG tablet  Take 1 tablet (40 mg total) by mouth daily before breakfast.  30 tablet  0   .  HYDROcodone-acetaminophen (NORCO) 10-325 MG per tablet  Take 1 tablet by mouth 5 (five) times daily.     Marland Kitchen  HYDROmorphone (DILAUDID) 4 MG tablet  Take 1 tablet (4 mg total) by mouth at bedtime.  40 tablet  0   .  ipratropium (ATROVENT) 0.02 % nebulizer solution  Take 0.5 mg by nebulization 4 (four) times daily.     .  Ipratropium-Albuterol (COMBIVENT RESPIMAT) 20-100 MCG/ACT AERS respimat  Inhale 1 puff into the lungs every 6 (six) hours as needed for wheezing or shortness of breath.  1  Inhaler  5   .  isosorbide mononitrate (IMDUR) 30 MG 24 hr tablet  Take 1 tablet (30 mg total) by mouth daily.  30 tablet  0   .  mometasone-formoterol (DULERA) 100-5 MCG/ACT AERO  Inhale 2 puffs into the lungs 2 (two) times daily.  1 Inhaler  0   .  nicotine (NICODERM CQ - DOSED IN MG/24 HOURS) 14 mg/24hr patch  Place 1 patch (14 mg total) onto the skin daily.  28 patch  0   .  NITROSTAT 0.3 MG SL tablet  Place 0.3 mg under the tongue every 5 (five) minutes as needed for chest pain.     .  NON FORMULARY  Place 2 L into the nose at bedtime. O2 at night only     .  predniSONE (DELTASONE) 10 MG tablet  Take 1 tablet (10 mg total) by mouth daily with breakfast.  30 tablet  0   .  valsartan (DIOVAN) 40 MG tablet  Take 1 tablet by mouth daily.      No current facility-administered medications for this visit.    Allergies   Allergen  Reactions   .  Ativan [Lorazepam]  Other (See  Comments)     Increases agitation, tolerates xanax   .  Lisinopril  Cough   .  Morphine And Related      Hallucinations, too sedated when given with ativan   .  Ibuprofen  Hives, Nausea And Vomiting and Rash     Tolerates baby aspirin per patient.    Past Medical History   Diagnosis  Date   .  CAD (coronary artery disease)      a. s/p CABG x 5; b. 04/2012 Cath: patent LIMA->LAD and VG->OM3, all other grafts occluded. c. Cath 12/2013: patent SVG-OM3 & LIMA-LAD, known occ other VG.   Marland Kitchen  Hypertension    .  Anxiety    .  Adjustment disorder with anxiety    .  Hyperlipidemia      a. Unable to take statins.   Marland Kitchen  BPH (benign prostatic hypertrophy)    .  COPD (chronic obstructive pulmonary disease)      a. on home O2.   .  Arthritis    .  Ischemic cardiomyopathy      a. 06/2013 Echo: EF 30-35%, no reg wma's, Gr2 DD, triv AI, mod dil LA, nl RV fxn.   .  Chronic systolic CHF (congestive heart failure)      a. 06/2013 Echo: EF 30-35%.   .  Prostate cancer  dx'd 2012   .  Renal cancer  dx'd 1997     lt nephrectomy   .  CKD (chronic kidney disease), stage III    .  Tobacco abuse      a. 53 yr hx as of 2015, transitioned to e-cig.   Marland Kitchen  Syncope      a. 08/2013: the day following cath, no injury, had received multiple doses of Versed for anxiety - this was felt to be a contributing factor.   Marland Kitchen  PAF (paroxysmal atrial fibrillation)    .  Hypotension      a. H/o soft BP prohibiting ACEI/ARB use.   .  Chronic pain    .  PAD (peripheral artery disease)      a. 12/2013: PV angio s/p angioplasty to R external iliac artery stenosis, also has R SFA occlusion with reconstitution of above-knee popliteal artery, 2 vessel runoff via peroneal and posterior tib.   Marland Kitchen  Pulmonary nodules      a. 12/2013: small pulmonary nodules measuring up to 22mm in RM/LL, f/u recommended 6-12 months.    Past Surgical History   Procedure  Laterality  Date   .  Coronary artery bypass graft       x 5   .  Cardiac  catheterization     .  Nephrectomy       left nephrectomy for ca   .  Posterior cervical laminectomy       x 8 limited ROM and can't lie flat   .  Heart stents       x 5   .  Robot assisted laparoscopic radical prostatectomy       for prostate cancer   .  Cystoscopy with litholapaxy   05/01/2012     Procedure: CYSTOSCOPY WITH LITHOLAPAXY; Surgeon: Dutch Gray, MD; Location: [facility]; Service: Urology; Laterality: N/A;   .  Prostate cancer     .  Kidney surgery  Left  1994    History   Smoking status   .  Former Smoker -- 0.30 packs/day for 54 years   .  Types:  Cigarettes   .  Quit date:  02/04/2014   Smokeless tobacco   .  Former Systems developer   .  Quit date:  04/19/2012     Comment: Using e-cig now    History   Alcohol Use  No    Family History   Problem  Relation  Age of Onset   .  Diabetes  Mother    .  Heart disease  Mother    .  Hypertension  Mother    .  Heart attack  Mother    .  Coronary artery disease     .  Heart disease  Father    .  Diabetes  Father    .  Heart attack  Father    .  Heart disease  Brother    .  Heart attack  Brother    .  COPD  Sister    Review of Systems:  The review of systems is per the HPI. All other systems were reviewed and are negative.  Physical Exam:  BP 180/90  Pulse 82  Ht 5\' 9"  (1.753 m)  Wt 218 lb (98.884 kg)  BMI 32.18 kg/m2  SpO2 88%  Patient is chronically ill and in mild distress. Lucas [name] smells of tobacco. Lucas [name] is short of breath with sitting here and talking. Weight is up. Skin is warm and dry. Color is normal. HEENT is unremarkable. Normocephalic/atraumatic. PERRL. Sclera are nonicteric. Neck is supple. No masses. No JVD. Lungs show decreased breath sounds. No wheezing noted. Cardiac exam shows a regular rate and rhythm. Occasional ectopic. Abdomen is soft. Extremities are with 1+edema. Gait and ROM are intact. No gross neurologic deficits noted.  Wt Readings from Last 3 Encounters:   03/08/14  218 lb (98.884 kg)   03/07/14  220 lb  (99.791 kg)   02/22/14  215 lb 9.6 oz (97.796 kg)   LABORATORY DATA/PROCEDURES:  Lab Results   Component  Value  Date    WBC  14.4*  02/02/2014    HGB  12.5*  02/02/2014    HCT  39.4  02/02/2014    PLT  218  02/02/2014    GLUCOSE  136*  02/05/2014    CHOL  208*  07/01/2013    TRIG  144  07/01/2013    HDL  34*  07/01/2013    LDLCALC  145*  07/01/2013    ALT  14  01/07/2014    AST  23  01/07/2014    NA  139  02/05/2014    K  4.3  02/05/2014    CL  99  02/05/2014    CREATININE  1.41*  02/05/2014    BUN  48*  02/05/2014    CO2  28  02/05/2014    TSH  1.729  07/01/2013    INR  1.17  12/25/2013    HGBA1C  6.1*  07/01/2013   BNP (last 3 results)  Recent Labs   01/07/14 0005  01/29/14 0945  01/31/14 1724   PROBNP  1118.0*  203.0*  1616.0*   Echo Study Conclusions from April 2015  - Left ventricle: The cavity size was normal. Wall thickness was increased in a pattern of moderate LVH. Systolic function was mildly to moderately reduced. The estimated ejection fraction was in the range of 40% to 45%. There is hypokinesis of the mid-distalanteroseptal myocardium. Left ventricular diastolic function parameters were normal. - Aortic valve: Trivial regurgitation. - Mitral valve: Calcified annulus. - Left atrium: The atrium was mildly dilated. - Right atrium: The atrium was mildly dilated.  Assessment / Plan:  1. Chronic respiratory failure - O2 sat 88% here today.  2. Chronic systolic HF - EF of 40 to 45% - Has weight gain, swelling on exam, etc.  3. Chest pain - known CAD  4. Tobacco abuse  5. HTN - BP sky high.  6. PVD  Will proceed with admission to rule out MI, diurese, etc. Treat BP. Do not feel that we will be able to turn this around at home. Further disposition to follow. Lucas [name] has had an echo just 4 months ago - will not repeat for now. Probably needs social work to see for disposition due to numerous admissions. Has bed available on 3E.  Discussed with Dr. Ron Parker who is in agreement.   Patient is agreeable to this plan and will call if any problems develop in the interim.    Burtis Junes, RN, Amanda  [address] [place]  [place], [place] [address]  [phone]    I reviewed all the information completely with Lucas [name] ANP-C. The patient is volume overloaded. Lucas [name] is admitted for diuresis. Lucas [name] will need more aggressive post hospital care to work harder on keeping him out of the hospital.  Daryel November, MD

## 2014-03-08 NOTE — Patient Instructions (Signed)
We are going to admit you to the hospital.  

## 2014-03-08 NOTE — Care Management Note (Addendum)
  Page 2 of 2   03/11/2014     1:52:15 PM CARE MANAGEMENT NOTE 03/11/2014  Patient:  Lucas [name], Lucas [name]   Account Number:  [id number]  Date Initiated:  03/08/2014  Documentation initiated by:  Marah Park  Subjective/Objective Assessment:   PVD with claudication     Action/Plan:   CM will monitor for disposition needs   Anticipated DC Date:  03/11/2014   Anticipated DC Plan:  [place]  CM consult      Choice offered to / List presented to:             Status of service:  Completed, signed off Medicare Important Message given?  YES (If response is "NO", the following Medicare IM given date fields will be blank) Date Medicare IM given:  03/11/2014 Medicare IM given by:  Murrell Dome Date Additional Medicare IM given:  03/11/2014 Additional Medicare IM given by:    Discharge Disposition:  HOME/SELF CARE  Per UR Regulation:  Reviewed for med. necessity/level of care/duration of stay  If discussed at [place] of Stay Meetings, dates discussed:    Comments:  Lynda Capistran RN, BSN, MSHL, CCM  Nurse - Case Manager,  ([facility] [place])  [phone]  03/08/2014 PVD with claudication Social:  From home alone.  Patient has supportive friends he can call on when in need of assistance. Transportation: Yes and friends as needed. Hx/o past HHS with [facility]; patient refuses HHS and was bothered by visits. CM discussed hx/o 7 admissions over the past 6 months and importace of proactive care needs.   Patient states reason for admissions is d/t drinking Dr. Malachi Bonds which has sodium and plans to stop drinking this product. CM confirmed patient is self weighing daily and wt is electronically sent into [employer] who reports changes to his MD office. CM provided education on other option of cola - Diet [employer] which has no sodium. Patient reports compliance with Medications and keeping MD appt's. Home DME:  Home oxygen - using at hs and prn during the  day Hx/o issues in the past with affording the Eliquis. Patient currently has  ([employer]  & [employer] coverage) Disposition Plan:  Home / [place]

## 2014-03-09 ENCOUNTER — Observation Stay (HOSPITAL_COMMUNITY): Payer: PRIVATE HEALTH INSURANCE

## 2014-03-09 DIAGNOSIS — I509 Heart failure, unspecified: Secondary | ICD-10-CM

## 2014-03-09 DIAGNOSIS — Z951 Presence of aortocoronary bypass graft: Secondary | ICD-10-CM | POA: Diagnosis not present

## 2014-03-09 DIAGNOSIS — R918 Other nonspecific abnormal finding of lung field: Secondary | ICD-10-CM | POA: Diagnosis present

## 2014-03-09 DIAGNOSIS — I4891 Unspecified atrial fibrillation: Secondary | ICD-10-CM | POA: Diagnosis present

## 2014-03-09 DIAGNOSIS — I739 Peripheral vascular disease, unspecified: Secondary | ICD-10-CM | POA: Diagnosis present

## 2014-03-09 DIAGNOSIS — J9610 Chronic respiratory failure, unspecified whether with hypoxia or hypercapnia: Secondary | ICD-10-CM | POA: Diagnosis present

## 2014-03-09 DIAGNOSIS — Z7982 Long term (current) use of aspirin: Secondary | ICD-10-CM | POA: Diagnosis not present

## 2014-03-09 DIAGNOSIS — G8929 Other chronic pain: Secondary | ICD-10-CM | POA: Diagnosis present

## 2014-03-09 DIAGNOSIS — F411 Generalized anxiety disorder: Secondary | ICD-10-CM | POA: Diagnosis present

## 2014-03-09 DIAGNOSIS — J4489 Other specified chronic obstructive pulmonary disease: Secondary | ICD-10-CM | POA: Diagnosis present

## 2014-03-09 DIAGNOSIS — Z9861 Coronary angioplasty status: Secondary | ICD-10-CM | POA: Diagnosis not present

## 2014-03-09 DIAGNOSIS — Z905 Acquired absence of kidney: Secondary | ICD-10-CM | POA: Diagnosis not present

## 2014-03-09 DIAGNOSIS — I5023 Acute on chronic systolic (congestive) heart failure: Principal | ICD-10-CM

## 2014-03-09 DIAGNOSIS — Z9079 Acquired absence of other genital organ(s): Secondary | ICD-10-CM | POA: Diagnosis not present

## 2014-03-09 DIAGNOSIS — Z7901 Long term (current) use of anticoagulants: Secondary | ICD-10-CM | POA: Diagnosis not present

## 2014-03-09 DIAGNOSIS — E785 Hyperlipidemia, unspecified: Secondary | ICD-10-CM | POA: Diagnosis present

## 2014-03-09 DIAGNOSIS — F17200 Nicotine dependence, unspecified, uncomplicated: Secondary | ICD-10-CM | POA: Diagnosis present

## 2014-03-09 DIAGNOSIS — I2589 Other forms of chronic ischemic heart disease: Secondary | ICD-10-CM | POA: Diagnosis present

## 2014-03-09 DIAGNOSIS — Z9981 Dependence on supplemental oxygen: Secondary | ICD-10-CM | POA: Diagnosis not present

## 2014-03-09 DIAGNOSIS — J449 Chronic obstructive pulmonary disease, unspecified: Secondary | ICD-10-CM | POA: Diagnosis present

## 2014-03-09 DIAGNOSIS — R911 Solitary pulmonary nodule: Secondary | ICD-10-CM | POA: Insufficient documentation

## 2014-03-09 DIAGNOSIS — I2510 Atherosclerotic heart disease of native coronary artery without angina pectoris: Secondary | ICD-10-CM | POA: Diagnosis present

## 2014-03-09 DIAGNOSIS — Z8546 Personal history of malignant neoplasm of prostate: Secondary | ICD-10-CM | POA: Diagnosis not present

## 2014-03-09 DIAGNOSIS — N183 Chronic kidney disease, stage 3 unspecified: Secondary | ICD-10-CM | POA: Diagnosis present

## 2014-03-09 DIAGNOSIS — I129 Hypertensive chronic kidney disease with stage 1 through stage 4 chronic kidney disease, or unspecified chronic kidney disease: Secondary | ICD-10-CM | POA: Diagnosis present

## 2014-03-09 DIAGNOSIS — Z85528 Personal history of other malignant neoplasm of kidney: Secondary | ICD-10-CM | POA: Diagnosis not present

## 2014-03-09 LAB — BASIC METABOLIC PANEL
Anion gap: 13 (ref 5–15)
BUN: 37 mg/dL — ABNORMAL HIGH (ref 6–23)
CO2: 27 mEq/L (ref 19–32)
Calcium: 9 mg/dL (ref 8.4–10.5)
Chloride: 99 mEq/L (ref 96–112)
Creatinine, Ser: 1.64 mg/dL — ABNORMAL HIGH (ref 0.50–1.35)
GFR calc Af Amer: 48 mL/min — ABNORMAL LOW (ref >90)
GFR calc non Af Amer: 41 mL/min — ABNORMAL LOW (ref >90)
Glucose, Bld: 89 mg/dL (ref 70–99)
Potassium: 3.9 mEq/L (ref 3.7–5.3)
Sodium: 139 mEq/L (ref 137–147)

## 2014-03-09 MED ORDER — IPRATROPIUM-ALBUTEROL 0.5-2.5 (3) MG/3ML IN SOLN
3.0000 mL | Freq: Four times a day (QID) | RESPIRATORY_TRACT | Status: DC
  Administered 2014-03-09 – 2014-03-11 (×7): 3 mL via RESPIRATORY_TRACT
  Filled 2014-03-09 (×7): qty 3

## 2014-03-09 MED ORDER — OXYCODONE-ACETAMINOPHEN 5-325 MG PO TABS
1.0000 | ORAL_TABLET | Freq: Once | ORAL | Status: DC
  Filled 2014-03-09: qty 1

## 2014-03-09 MED ORDER — HYDROMORPHONE HCL 2 MG PO TABS
4.0000 mg | ORAL_TABLET | ORAL | Status: DC | PRN
  Administered 2014-03-09 – 2014-03-11 (×7): 4 mg via ORAL
  Filled 2014-03-09 (×7): qty 2

## 2014-03-09 MED ORDER — FUROSEMIDE 10 MG/ML IJ SOLN
60.0000 mg | Freq: Every day | INTRAMUSCULAR | Status: DC
  Administered 2014-03-10 – 2014-03-11 (×2): 60 mg via INTRAVENOUS
  Filled 2014-03-09 (×2): qty 6

## 2014-03-09 MED ORDER — OXYCODONE HCL 5 MG PO TABS
5.0000 mg | ORAL_TABLET | Freq: Once | ORAL | Status: DC
  Filled 2014-03-09 (×2): qty 1

## 2014-03-09 MED ORDER — HYDROMORPHONE HCL PF 1 MG/ML IJ SOLN
0.5000 mg | INTRAMUSCULAR | Status: DC | PRN
Start: 2014-03-09 — End: 2014-03-09

## 2014-03-09 MED ORDER — HYDROMORPHONE HCL PF 1 MG/ML IJ SOLN
0.5000 mg | Freq: Four times a day (QID) | INTRAMUSCULAR | Status: DC | PRN
Start: 2014-03-09 — End: 2014-03-11
  Administered 2014-03-09 – 2014-03-11 (×7): 0.5 mg via INTRAVENOUS
  Filled 2014-03-09 (×8): qty 1

## 2014-03-09 NOTE — Progress Notes (Signed)
1853 resting . No complaint presented. Safety precaution maijtaind

## 2014-03-09 NOTE — Assessment & Plan Note (Signed)
#  COPD Currently disease is stable Continue combivent respimat 4 times daily Continue Dulera 2 puff twice daily Reduce prednisone to 5mg  daily and stay on it; will rreduce it further at next visit Will consider roflumilast to prevent flare ups at next visit Use duoneb as needed Continue oxygen 18h/day REspect your decision to refuse pneumonia vaccine Please have flu shot when available in community Take care of leg bypass - then you can do reha

## 2014-03-09 NOTE — Progress Notes (Signed)
UR Completed.  Phuong Moffatt Jane [phone] 03/09/2014  

## 2014-03-09 NOTE — Progress Notes (Signed)
Patient ID: BOHDEN DUNG, male   DOB: Jul 28, 1945, 68 y.o.   MRN: [id number]     Subjective:   + SOB, pleuritic left sided chest pain   Objective:   Temp:  [97.4 F (36.3 C)-98.7 F (37.1 C)] 97.4 F (36.3 C) (08/29 0438) Pulse Rate:  [68-92] 68 (08/29 0438) Resp:  [18-20] 18 (08/29 0438) BP: (101-139)/(54-63) 114/63 mmHg (08/29 0438) SpO2:  [94 %-98 %] 98 % (08/29 0438) Weight:  [214 lb 11.7 oz (97.4 kg)-217 lb 3.2 oz (98.521 kg)] 217 lb 3.2 oz (98.521 kg) (08/29 0438) Last BM Date: 03/08/14  Filed Weights   03/08/14 1058 03/09/14 0438  Weight: 214 lb 11.7 oz (97.4 kg) 217 lb 3.2 oz (98.521 kg)    Intake/Output Summary (Last 24 hours) at 03/09/14 1040 Last data filed at 03/09/14 1005  Gross per 24 hour  Intake    343 ml  Output   1400 ml  Net  -1057 ml    Exam:  General: NAD  Resp: mild expiratory wheezing  Cardiac: RRR, no m/r/g  GI: abdomen soft, NT, N  MSK: 1+ bilateral edema  Neuro: no focal deficits  Psych:appropriate affect  Lab Results:  Basic Metabolic Panel:  Recent Labs Lab 03/08/14 1157 03/09/14 0340  NA 141 139  K 3.7 3.9  CL 102 99  CO2 23 27  GLUCOSE 79 89  BUN 31* 37*  CREATININE 1.33 1.64*  CALCIUM 9.1 9.0    Liver Function Tests:  Recent Labs Lab 03/08/14 1157  AST 23  ALT 26  ALKPHOS 44  BILITOT 0.2*  PROT 6.8  ALBUMIN 3.6    CBC:  Recent Labs Lab 03/08/14 1157  WBC 10.1  HGB 14.2  HCT 42.1  MCV 91.3  PLT 159    Cardiac Enzymes:  Recent Labs Lab 03/08/14 1154 03/08/14 1709 03/08/14 2215  TROPONINI <0.30 <0.30 <0.30    BNP:  Recent Labs  01/29/14 0945 01/31/14 1724 03/08/14 1154  PROBNP 203.0* 1616.0* 795.4*    Coagulation:  Recent Labs Lab 03/08/14 1157  INR 1.12    ECG:   Medications:   Scheduled Medications: . albuterol  2.5 mg Nebulization QID  . apixaban  5 mg Oral BID  . aspirin EC  81 mg Oral Daily  . doxazosin  8 mg Oral QHS  . furosemide  60 mg Intravenous BID   . HYDROcodone-acetaminophen  1 tablet Oral QID  . ipratropium  0.5 mg Nebulization QID  . irbesartan  75 mg Oral Daily  . isosorbide mononitrate  60 mg Oral Daily  . mometasone-formoterol  2 puff Inhalation BID  . nicotine  14 mg Transdermal Daily  . oxyCODONE-acetaminophen  1 tablet Oral Once   And  . oxyCODONE  5 mg Oral Once  . predniSONE  10 mg Oral Q breakfast  . sodium chloride  3 mL Intravenous Q12H     Infusions:     PRN Medications:  sodium chloride, acetaminophen, albuterol, ALPRAZolam, HYDROmorphone, ipratropium-albuterol, ondansetron (ZOFRAN) IV, sodium chloride   10/2013 echo Study Conclusions  - Left ventricle: The cavity size was normal. Wall thickness was increased in a pattern of moderate LVH. Systolic function was mildly to moderately reduced. The estimated ejection fraction was in the range of 40% to 45%. There is hypokinesis of the mid-distalanteroseptal myocardium. Left ventricular diastolic function parameters were normal. - Aortic valve: Trivial regurgitation. - Mitral valve: Calcified annulus. - Left atrium: The atrium was mildly dilated. - Right atrium: The atrium was  mildly dilated.  08/2013 Cath Right Heart:  Mean RA: 6 mmHg  RV: 35/1 mmHg  PA 26/4 mmHg mean 15 mmHg  PCWP: Mean 10 mmHg  Ao 117/68 mmHg  Ao sat 94%  PA sat 58%  CO 3.97 L/min  Coronary angiography:  Coronary dominance: right  Left mainstem: Patent with moderate diffuse disease.  Left anterior descending (LAD): Total proximal occlusion, heavy calcification.  Left circumflex (LCx): Severe diffuse disease with severe stenoses of the first and second obtuse marginal branches at the ostia. Subtotal occlusion of the mid left circumflex.  Right coronary artery (RCA): Total occlusion of the proximal vessel Known frim 20123  SVG - OM3 widely patent, supplies collateral to OM1/2 and RCA branches  SVG - diag total occlusion Known from 2013  SVG - RCA total occlusion Known from 2013    SVG OM4 total occlusion Known from 2013  LIMA - LAD widely patent, supplies collateral to PDA  Final Conclusions:  1. Severe three-vessel coronary artery disease  2. 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  3. Normal intracardiac hemodynamics  Recommendations: Continued medical therapy. His coronary anatomy is stable from his prior cardiac catheterization.  Please see separate note by Dr Burt Knack. PV runoff done. Long SFA occlusion. Not a great surgical candidate and doubt long SFA stent would have longevity Medical Rx  Of both PVC and CAD   Assessment/Plan    68 yo male hx of COPD on home O2, chronic systolic HF LVEF 22-48%, hx of renal CA with prior nephrectomy, CKD, CAD with prior CABG, PAF, PAD admitted with SOB.  1. Acute on chronic systolic HF - likely exacerbating factor is dietary indiscretions. Noted weight gain and swelling on admission - negative 1.8 liters since admission, uptrend in Cr though still within his baseline of 1.3-1.6. He is on lasix 60mg  IV bid. Will decrease to IV 60mg  qday  2. COPD - he is on albuterol and atrovent nebs and prednisone  3. PAF - on anticoag with eliquis - normal rates  4. CAD - recent episodes of chest pain at home - last cath 08/2013 with severe 3 vessel CAD. SVG-OM3 patent, LIMA-LAD patent, and known CTO of SVG-diag, SVG-RCA, SVG-OM4.  - trop neg x3, no acute ischemic changes on EKG -  Chest pain this morning described as left sided, constant since yesterday morning. Worst with deep breaths. Overall consistent with pleuritic chest pain, he states he is allergic to morphine, the best medicine that's helped before is dilaudid. Has history of chronic pain, on oral dilaudid at home and in hospital. Will add prn IV dilaudid for severe breakthrough pain.       Carlyle Dolly, M.D., F.A.C.C.

## 2014-03-09 NOTE — Assessment & Plan Note (Signed)
#  Lung nodule RLL 49mm - June 2015 - do CT chest wo contrast in 9 months

## 2014-03-10 LAB — BASIC METABOLIC PANEL
Anion gap: 12 (ref 5–15)
BUN: 38 mg/dL — ABNORMAL HIGH (ref 6–23)
CO2: 28 mEq/L (ref 19–32)
Calcium: 9.1 mg/dL (ref 8.4–10.5)
Chloride: 99 mEq/L (ref 96–112)
Creatinine, Ser: 1.45 mg/dL — ABNORMAL HIGH (ref 0.50–1.35)
GFR calc Af Amer: 56 mL/min — ABNORMAL LOW (ref >90)
GFR calc non Af Amer: 48 mL/min — ABNORMAL LOW (ref >90)
Glucose, Bld: 92 mg/dL (ref 70–99)
Potassium: 3.8 mEq/L (ref 3.7–5.3)
Sodium: 139 mEq/L (ref 137–147)

## 2014-03-10 MED ORDER — HYDROMORPHONE HCL PF 1 MG/ML IJ SOLN
0.5000 mg | Freq: Once | INTRAMUSCULAR | Status: AC
  Filled 2014-03-10: qty 1

## 2014-03-10 MED ORDER — HYDROMORPHONE HCL PF 1 MG/ML IJ SOLN
0.5000 mg | Freq: Once | INTRAMUSCULAR | Status: AC
  Administered 2014-03-10: 0.5 mg via INTRAVENOUS

## 2014-03-10 NOTE — Progress Notes (Signed)
Pt took self off tele and attempted to d/c PIV as he said he"I want more iv dilduad, am leaving formore IV dilaudid, checking out going to [facility]."  Dr. Harl Bowie notified and ordered dilaudid 0.5mg  iv x1. And to ask him to stay until tomorrow when he will be d/c'd.  Pt informed and stated " I'll stay.  Will continue to monitor.

## 2014-03-10 NOTE — Progress Notes (Signed)
Patient ID: ZERICK PREVETTE, male   DOB: 1945-11-12, 68 y.o.   MRN: [id number]      Subjective:   SOB much improved from yesterday, still not at baseline.    Objective:   Temp:  [97.3 F (36.3 C)-98.1 F (36.7 C)] 98.1 F (36.7 C) (08/30 0445) Pulse Rate:  [61-93] 61 (08/30 0445) Resp:  [18-20] 18 (08/30 0445) BP: (97-126)/(54-72) 126/54 mmHg (08/30 0445) SpO2:  [90 %-98 %] 96 % (08/30 0738) Weight:  [214 lb 1.1 oz (97.1 kg)] 214 lb 1.1 oz (97.1 kg) (08/30 0445) Last BM Date: 03/08/14  Filed Weights   03/08/14 1058 03/09/14 0438 03/10/14 0445  Weight: 214 lb 11.7 oz (97.4 kg) 217 lb 3.2 oz (98.521 kg) 214 lb 1.1 oz (97.1 kg)    Intake/Output Summary (Last 24 hours) at 03/10/14 0908 Last data filed at 03/10/14 9242  Gross per 24 hour  Intake   1303 ml  Output   2450 ml  Net  -1147 ml    Exam:  General: NAD  Resp: faint crackles bilateral bases, mild expiratory wheezing.   Cardiac: RRR, no m/r/g, no JVD just below angle of jaw  GI: abdomen soft, NT, ND  MSK: 1+ bilateral edema  Neuro: no focal deficits  Psych: appropriate affect  Lab Results:  Basic Metabolic Panel:  Recent Labs Lab 03/08/14 1157 03/09/14 0340 03/10/14 0459  NA 141 139 139  K 3.7 3.9 3.8  CL 102 99 99  CO2 23 27 28   GLUCOSE 79 89 92  BUN 31* 37* 38*  CREATININE 1.33 1.64* 1.45*  CALCIUM 9.1 9.0 9.1    Liver Function Tests:  Recent Labs Lab 03/08/14 1157  AST 23  ALT 26  ALKPHOS 44  BILITOT 0.2*  PROT 6.8  ALBUMIN 3.6    CBC:  Recent Labs Lab 03/08/14 1157  WBC 10.1  HGB 14.2  HCT 42.1  MCV 91.3  PLT 159    Cardiac Enzymes:  Recent Labs Lab 03/08/14 1154 03/08/14 1709 03/08/14 2215  TROPONINI <0.30 <0.30 <0.30    BNP:  Recent Labs  01/29/14 0945 01/31/14 1724 03/08/14 1154  PROBNP 203.0* 1616.0* 795.4*    Coagulation:  Recent Labs Lab 03/08/14 1157  INR 1.12    ECG:   Medications:   Scheduled Medications: . apixaban  5 mg Oral  BID  . aspirin EC  81 mg Oral Daily  . doxazosin  8 mg Oral QHS  . furosemide  60 mg Intravenous Daily  . ipratropium-albuterol  3 mL Nebulization QID  . irbesartan  75 mg Oral Daily  . isosorbide mononitrate  60 mg Oral Daily  . mometasone-formoterol  2 puff Inhalation BID  . nicotine  14 mg Transdermal Daily  . oxyCODONE-acetaminophen  1 tablet Oral Once   And  . oxyCODONE  5 mg Oral Once  . predniSONE  10 mg Oral Q breakfast  . sodium chloride  3 mL Intravenous Q12H     Infusions:     PRN Medications:  sodium chloride, acetaminophen, ALPRAZolam, HYDROmorphone (DILAUDID) injection, HYDROmorphone, ipratropium-albuterol, ondansetron (ZOFRAN) IV, sodium chloride     Assessment/Plan   1. Acute on chronic systolic HF  - echo 12/8339 LVEF 96-22%, normal diastolic function - likely exacerbating factor is dietary indiscretions. Noted weight gain and swelling on admission  - negative 1.5 liters yesterday, negative 2.2 liters since admission, Cr remains within his baseline of 1.3-1.6. Yesterday decreased lasix from 60mg  IV bid to qday due to uptrend in  Cr, has since trended down.  - continue IV lasix today, still with some symptoms and evidence of volume overload  2. COPD  - he is on albuterol and atrovent nebs and prednisone   3. PAF  - on anticoag with eliquis  - normal rates   4. CAD  - recent episodes of chest pain at home  - last cath 08/2013 with severe 3 vessel CAD. SVG-OM3 patent, LIMA-LAD patent, and known CTO of SVG-diag, SVG-RCA, SVG-OM4.  - trop neg x3, no acute ischemic changes on EKG   - Chest pain this morning described as left sided, constant since yesterday morning. Worst with deep breaths. Overall consistent with pleuritic chest pain, he states he is allergic to morphine, the best medicine that's helped before is dilaudid. Has history of chronic pain, on oral dilaudid at home and in hospital. Will add prn IV dilaudid for severe breakthrough pain.           Carlyle Dolly, M.D., F.A.C.C.

## 2014-03-11 ENCOUNTER — Other Ambulatory Visit: Payer: Self-pay | Admitting: Physician Assistant

## 2014-03-11 ENCOUNTER — Encounter (HOSPITAL_COMMUNITY): Payer: Self-pay | Admitting: Physician Assistant

## 2014-03-11 DIAGNOSIS — I5021 Acute systolic (congestive) heart failure: Secondary | ICD-10-CM

## 2014-03-11 DIAGNOSIS — I4891 Unspecified atrial fibrillation: Secondary | ICD-10-CM

## 2014-03-11 LAB — BASIC METABOLIC PANEL
Anion gap: 14 (ref 5–15)
BUN: 43 mg/dL — ABNORMAL HIGH (ref 6–23)
CO2: 25 mEq/L (ref 19–32)
Calcium: 8.7 mg/dL (ref 8.4–10.5)
Chloride: 97 mEq/L (ref 96–112)
Creatinine, Ser: 1.49 mg/dL — ABNORMAL HIGH (ref 0.50–1.35)
GFR calc Af Amer: 54 mL/min — ABNORMAL LOW (ref >90)
GFR calc non Af Amer: 46 mL/min — ABNORMAL LOW (ref >90)
Glucose, Bld: 89 mg/dL (ref 70–99)
Potassium: 5.4 mEq/L — ABNORMAL HIGH (ref 3.7–5.3)
Sodium: 136 mEq/L — ABNORMAL LOW (ref 137–147)

## 2014-03-11 MED ORDER — ISOSORBIDE MONONITRATE ER 60 MG PO TB24: 60.0000 mg | ORAL_TABLET | Freq: Every day | ORAL | Status: DC

## 2014-03-11 MED ORDER — FUROSEMIDE 20 MG PO TABS: 20.0000 mg | ORAL_TABLET | Freq: Every day | ORAL | Status: DC

## 2014-03-11 MED ORDER — PREDNISONE 10 MG PO TABS: 10.0000 mg | ORAL_TABLET | Freq: Every day | ORAL | Status: DC

## 2014-03-11 MED ORDER — HYDROMORPHONE HCL 4 MG PO TABS: 4.0000 mg | ORAL_TABLET | ORAL | Status: DC | PRN

## 2014-03-11 NOTE — Progress Notes (Signed)
1400 discharge instruction and prescription given to pt/. Verbalized understanding. Wheeled to lobby by NT

## 2014-03-11 NOTE — Discharge Summary (Signed)
See progress notes Lucas [name]  

## 2014-03-11 NOTE — Discharge Summary (Signed)
Discharge Summary   Patient ID: Lucas [name] MRN: [id number], DOB/AGE: 69-Apr-1947 69 y.o. Admit date: 03/08/2014 D/C date:     03/11/2014  Primary Care Provider: Salena Saner., MD Primary Cardiologist: Dr. Johnsie Cancel, MD   Primary Discharge Diagnoses:  1. Acute on chronic systolic CHF 2. COPD 3. PAF 4. CAD s/p 5v CABG, cath 12/2013: patent SVG-OM3 & LIMA-LAD, known occ other VG 5. HTN 6. Chronic pain  Secondary Discharge Diagnoses:  1. CKD stage III 2. HLD 3. PAD s/p angioplasty to R external iliac artery stenosis 12/2013 4. Renal cell carcinoma s/p nephrectomy 1997 5. Prostate carcinoma 2012 6. Tobacco abuse  Hospital Course: 68 y/o M who was seen back today for a post hospital visit on 03/08/14 by Lucas [name], ANP-C for Dr. Johnsie Cancel. Patient is a chronically ill male with multiple issues. These include COPD with chronic respiratory failure on home oxygen at night, chronic systolic HF with EF of 40 to 45%, remote renal cancer with past nephrectomy with CKD stage III, CAD with past CABG with his LIMA to the LAD patent and the SVG to OM3 but all other SVGs remain occluded. His other issues include anxiety, PAF on AC (Eliquis), long standing tobacco abuse, PAD with past angioplasty 12/2013, and chronic pain. He has had numerous hospitalizations.    He is trying to have surgical revascularization for his PVD - felt to be a poor candidate from our standpoint.  Just discharged a month ago with recurrent COPD exacerbation/HF.  Has done well until just prior to his office visit with Lucas [name], about 3 days before started holding on to fluid. Has been eating out recently - did not know that this food had salt. Has had chest pain - almost called 911 the night before his outpatient appointment with Lucas [name] (8/28). Chest pain lasted about 15 minutes. Some discomfort morning of 8/28. Weight is up. Had to walk a considerable distance. More swelling. More bloating.   During his OV his O2 sats were found  to be 88% on RA. He was directly admitted to 3e for r/o MI and diureses. During his hospitalization he was placed on IV Lasix 60 mg bid and had a net I/O of -1667L (more negative output earlier in admission, but this decreased when Lasix dose was decreased). SCr remained within patient's baseline 1.3-1.6. Lasix was decreased to qday on 8/30 2/2 slight bump in SCr which remained stable. He did complain of intermittent chest pain during admission that per his report would only resolve with dilaudid. He threatened to leave AMA during the afternoon of 8/30 if he did not get an extra dilaudid. He got this extra dose and decided to stay. Troponin negative x 3. EKG, NSR, 66, nonspecific st/t changes. K+ 5.4, ProBNP 795 (1616 one month ago). His dyspnea is currently back to baseline. He will be discharged on Lasix 20 mg in the morning, 40 mg at night. He will take an additional 40 mg as needed for weight gain of 2 pounds. He was instructed of low sodium diet and fluid restriction. He was advised to check a BMET in 1 week. He is to follow up with [facility] as directed below as well as with his PCP within 1 week. He will resume his remaining home medications. He was seen today by Dr. Stanford [name] who feels like patient is stable and ready for discharge.     Consults: None  Discharge Vitals: Blood pressure 113/64, pulse 83, temperature 97.5 F (36.4 C), temperature source Oral,  resp. rate 18, height 5\' 9"  (1.753 m), weight 218 lb 14.4 oz (99.292 kg), SpO2 95.00%.  Labs: Lab Results  Component Value Date   WBC 10.1 03/08/2014   HGB 14.2 03/08/2014   HCT 42.1 03/08/2014   MCV 91.3 03/08/2014   PLT 159 03/08/2014    Recent Labs Lab 03/08/14 1157  03/11/14 0734  NA 141  < > 136*  K 3.7  < > 5.4*  CL 102  < > 97  CO2 23  < > 25  BUN 31*  < > 43*  CREATININE 1.33  < > 1.49*  CALCIUM 9.1  < > 8.7  PROT 6.8  --   --   BILITOT 0.2*  --   --   ALKPHOS 44  --   --   ALT 26  --   --   AST 23  --   --   GLUCOSE  79  < > 89  < > = values in this interval not displayed.  Recent Labs  03/08/14 1709 03/08/14 2215  TROPONINI <0.30 <0.30   Lab Results  Component Value Date   CHOL 208* 07/01/2013   HDL 34* 07/01/2013   LDLCALC 145* 07/01/2013   TRIG 144 07/01/2013   Lab Results  Component Value Date   DDIMER 0.72* 07/01/2013    Diagnostic Studies/Procedures   Dg Chest 2 View  03/09/2014   CLINICAL DATA:  Dyspnea, left-sided chest pain x2 days  EXAM: CHEST  2 VIEW  COMPARISON:  Prior chest x-ray 01/31/2014  FINDINGS: Stable mild cardiomegaly. Patient is status post median sternotomy with evidence of multivessel CABG including LIMA bypass. Very mild pulmonary vascular congestion without overt edema. The degree of vascular congestion is improved compared to 01/31/2014. Linear opacities in the bilateral bases remain similar compared to prior and likely reflect chronic atelectasis or scarring. No focal airspace consolidation, pleural effusion or pneumothorax. No acute osseous abnormality.  IMPRESSION: Stable chest x-ray without evidence of acute cardiopulmonary process.   Electronically Signed   By: Lucas [name] M.D.   On: 03/09/2014 09:20    Discharge Medications   Current Discharge Medication List    START taking these medications   Details  !! HYDROmorphone (DILAUDID) 4 MG tablet Take 1 tablet (4 mg total) by mouth every 4 (four) hours as needed for moderate pain or severe pain. Qty: 30 tablet, Refills: 0   Associated Diagnoses: Acute systolic heart failure    isosorbide mononitrate (IMDUR) 60 MG 24 hr tablet Take 1 tablet (60 mg total) by mouth daily. Qty: 30 tablet, Refills: 1   Associated Diagnoses: Acute systolic heart failure    !! predniSONE (DELTASONE) 10 MG tablet Take 1 tablet (10 mg total) by mouth daily with breakfast. Qty: 30 tablet, Refills: 1   Associated Diagnoses: Acute systolic heart failure     !! - Potential duplicate medications found. Please discuss with  provider.    CONTINUE these medications which have CHANGED   Details  !! furosemide (LASIX) 20 MG tablet Take 1 tablet (20 mg total) by mouth daily. Take an additional 40 mg for weight gain over 2 pounds in 1 day. Qty: 30 tablet, Refills: 1     !! - Potential duplicate medications found. Please discuss with provider.    CONTINUE these medications which have NOT CHANGED   Details  albuterol (PROVENTIL HFA;VENTOLIN HFA) 108 (90 BASE) MCG/ACT inhaler Inhale 2 puffs into the lungs every 6 (six) hours as needed for wheezing or shortness of  breath. Shortness of breath Qty: 1 Inhaler, Refills: 5    albuterol (PROVENTIL) (2.5 MG/3ML) 0.083% nebulizer solution Take 2.5 mg by nebulization 4 (four) times daily.    ALPRAZolam (XANAX) 1 MG tablet Take 1 tablet (1 mg total) by mouth 3 (three) times daily as needed for anxiety. Qty: 30 tablet, Refills: 1    apixaban (ELIQUIS) 5 MG TABS tablet Take 1 tablet (5 mg total) by mouth 2 (two) times daily. Qty: 60 tablet, Refills: 1    aspirin EC 81 MG tablet Take 81 mg by mouth daily.    doxazosin (CARDURA) 8 MG tablet Take 8 mg by mouth at bedtime.     !! furosemide (LASIX) 40 MG tablet Take 40 mg by mouth at bedtime.    HYDROcodone-acetaminophen (NORCO) 10-325 MG per tablet Take 1 tablet by mouth 5 (five) times daily.    !! HYDROmorphone (DILAUDID) 4 MG tablet Take 4 mg by mouth 4 (four) times daily as needed for severe pain.    ipratropium (ATROVENT) 0.02 % nebulizer solution Take 0.5 mg by nebulization 4 (four) times daily.    Ipratropium-Albuterol (COMBIVENT RESPIMAT) 20-100 MCG/ACT AERS respimat Inhale 1 puff into the lungs every 6 (six) hours as needed for wheezing or shortness of breath. Qty: 1 Inhaler, Refills: 5    mometasone-formoterol (DULERA) 100-5 MCG/ACT AERO Inhale 2 puffs into the lungs 2 (two) times daily. Qty: 1 Inhaler, Refills: 0    nicotine (NICODERM CQ - DOSED IN MG/24 HOURS) 21 mg/24hr patch Place 21 mg onto the skin daily.      nitroGLYCERIN (NITROSTAT) 0.3 MG SL tablet Place 0.3 mg under the tongue every 5 (five) minutes as needed for chest pain.    !! predniSONE (DELTASONE) 10 MG tablet Take 1 tablet (10 mg total) by mouth daily with breakfast. Qty: 30 tablet, Refills: 0    valsartan (DIOVAN) 40 MG tablet Take 40 mg by mouth daily.     pentoxifylline (TRENTAL) 400 MG CR tablet Take 400 mg by mouth 2 (two) times daily.      !! - Potential duplicate medications found. Please discuss with provider.    STOP taking these medications     NON FORMULARY         Disposition   The patient will be discharged in stable condition to home. Discharge Instructions   Diet - low sodium heart healthy    Complete by:  As directed      Increase activity slowly    Complete by:  As directed           Follow-up Information   Follow up with [facility] labs. (03/18/14 between 8 AM-4 PM)    Contact information:   [facility] [address], [place]      Follow up with Jenkins Rouge, MD On 04/09/2014. (APPT: 11:45 AM)    Specialty:  Cardiology   Contact information:   [address] [place] [address] [phone]         Duration of Discharge Encounter: Greater than 30 minutes including physician and PA time.  Melvern Banker PA-C 03/11/2014, 1:56 PM

## 2014-03-11 NOTE — Progress Notes (Signed)
Patient evaluated for community based chronic disease management services with [facility] as a benefit of patient's [employer]. Spoke with patient at bedside to explain [facility] services.  Patient is familiar with [facility] services and has a [facility] nurse coming to his home and feels he can self manage.  Noted to be very anxious and ready to discharge to home.  [facility] will not engage at this time.  Left contact information and [facility] literature at bedside. Made Inpatient Case Manager aware that [facility] following. Of note, [facility] Management services does not replace or interfere with any services that are arranged by inpatient case management or social work.  For additional questions or referrals please contact Corliss Blacker BSN RN [facility] Liaison at [phone].

## 2014-03-11 NOTE — Progress Notes (Signed)
Patient: Lucas [name] / Admit Date: 03/08/2014 / Date of Encounter: 03/11/2014, 10:54 AM   Subjective: Intermittent chest pains this morning although he did not notify any staff. None currently. He states "what do you call that stuff?da-dalaudid" is the only thing that takes away his chest pain. Please see nursing note from 03/10/14 at 4:12 PM.     Objective: Telemetry: NSR, 90s Physical Exam: Blood pressure 113/64, pulse 83, temperature 97.5 F (36.4 C), temperature source Oral, resp. rate 18, height 5\' 9"  (1.753 m), weight 218 lb 14.4 oz (99.292 kg), SpO2 96.00%. General: Well developed, well nourished, in no acute distress. Head: Normocephalic, atraumatic, sclera non-icteric, no xanthomas, nares are without discharge. Neck: Negative for carotid bruits. JVP not elevated. Lungs: Clear bilaterally to auscultation without wheezes, rales, or rhonchi. Breathing is unlabored. Heart: RRR S1 S2 without murmurs, rubs, or gallops.  Abdomen: Soft, non-tender, non-distended with normoactive bowel sounds. No rebound/guarding. Extremities: No clubbing or cyanosis. No edema. Distal pedal pulses are 2+ and equal bilaterally. Neuro: Alert and oriented X 3. Moves all extremities spontaneously. Psych:  Responds to questions appropriately with a normal affect.   Intake/Output Summary (Last 24 hours) at 03/11/14 1054 Last data filed at 03/11/14 0806  Gross per 24 hour  Intake   1440 ml  Output    500 ml  Net    940 ml    Inpatient Medications:  . apixaban  5 mg Oral BID  . aspirin EC  81 mg Oral Daily  . doxazosin  8 mg Oral QHS  . furosemide  60 mg Intravenous Daily  . ipratropium-albuterol  3 mL Nebulization QID  . irbesartan  75 mg Oral Daily  . isosorbide mononitrate  60 mg Oral Daily  . mometasone-formoterol  2 puff Inhalation BID  . nicotine  14 mg Transdermal Daily  . oxyCODONE-acetaminophen  1 tablet Oral Once   And  . oxyCODONE  5 mg Oral Once  . predniSONE  10 mg Oral Q  breakfast  . sodium chloride  3 mL Intravenous Q12H   Infusions:    Labs:  Recent Labs  03/10/14 0459 03/11/14 0734  NA 139 136*  K 3.8 5.4*  CL 99 97  CO2 28 25  GLUCOSE 92 89  BUN 38* 43*  CREATININE 1.45* 1.49*  CALCIUM 9.1 8.7    Recent Labs  03/08/14 1157  AST 23  ALT 26  ALKPHOS 44  BILITOT 0.2*  PROT 6.8  ALBUMIN 3.6    Recent Labs  03/08/14 1157  WBC 10.1  NEUTROABS 7.7  HGB 14.2  HCT 42.1  MCV 91.3  PLT 159    Recent Labs  03/08/14 1154 03/08/14 1709 03/08/14 2215  TROPONINI <0.30 <0.30 <0.30   No components found with this basename: POCBNP,  No results found for this basename: HGBA1C,  in the last 72 hours   Radiology/Studies:  Dg Chest 2 View  03/09/2014   CLINICAL DATA:  Dyspnea, left-sided chest pain x2 days  EXAM: CHEST  2 VIEW  COMPARISON:  Prior chest x-ray 01/31/2014  FINDINGS: Stable mild cardiomegaly. Patient is status post median sternotomy with evidence of multivessel CABG including LIMA bypass. Very mild pulmonary vascular congestion without overt edema. The degree of vascular congestion is improved compared to 01/31/2014. Linear opacities in the bilateral bases remain similar compared to prior and likely reflect chronic atelectasis or scarring. No focal airspace consolidation, pleural effusion or pneumothorax. No acute osseous abnormality.  IMPRESSION: Stable chest x-ray  without evidence of acute cardiopulmonary process.   Electronically Signed   By: Jacqulynn Cadet M.D.   On: 03/09/2014 09:20     Assessment and Plan  68 y.o. male with h/o CAD s/p 5v CABG, chronic systolic CHF, remote renal cell CA s/p nephrectomy w/ CKD stage III, PAF, COPD, HTN, PVD, tobacco abuse who presented to [facility] on 8/28 as a direct admission from the office to r/o MI and for diureses.   1. Acute on chronic systolic CHF -Echo 07/6107 LVEF 60-45%, normal diastolic function -Likely exacerbating factor is dietary indiscretions. Noted weight gain and swelling  on admission -Negative 1.6 liters yesterday, negative 1.2 liters since admission (Lasix was decreased from 60mg  IV to qday 2/2 SCr), Cr remains within his baseline of 1.3-1.6. -Change IV Lasix 60 mg to PO Lasix 60 mg (home dose 20 mg in AM, 40 mg in PM)-if MD agrees -Patient eager for discharge-has already changed into street clothes and is wearing shoes  2. COPD  - he is on albuterol and atrovent nebs and prednisone   3. PAF  - on anticoag with eliquis  - normal rates   4. CAD  - recent episodes of chest pain at home  - last cath 08/2013 with severe 3 vessel CAD. SVG-OM3 patent, LIMA-LAD patent, and known CTO of SVG-diag, SVG-RCA, SVG-OM4.  - trop neg x3, no acute ischemic changes on EKG   -Chest pain intermittent. Recently ambulated up and down the hall of 3e without issues. Worst with deep breaths. Overall consistent with pleuritic chest pain, he states he is allergic to morphine, the best medicine that's helped before is dilaudid. Has history of chronic pain, on oral dilaudid at home and in hospital. Dilaudid was added for prn acute pain. Patient pulled out PIV stating he was going home. He wanted more IV dilaudid. He agreed to stay after this was given to him. Patient states today dilaudid is the only thing that works for his pain.       Signed, Christell Faith, PA-C 03/11/2014 11:08 AM  As above, patient seen and examined. Patient states his dyspnea is at baseline. He continues to have chronic chest pain. His volume status has improved. I will discharge today on Lasix 40 mg at night and 20 mg in the morning. He will take an additional 40 mg daily as needed for weight gain of 2 pounds. Patient instructed on low sodium diet and fluid restriction. Check Bmet one week after DC with results to Dr Johnsie Cancel. His enzymes are negative and no further ischemia evaluation indicated. Patient will need close followup with Dr. Johnsie Cancel. Will give one-week supply of Dilaudid. I have instructed him to followup  with primary care physician. > 30 min PA and physician time D2 Kirk Ruths

## 2014-03-12 ENCOUNTER — Telehealth: Payer: Self-pay | Admitting: Cardiovascular Disease

## 2014-03-12 NOTE — Telephone Encounter (Signed)
Spoke with pt who reports he was discharged from hospital yesterday. He reports at time of discharge he was feeling OK but just weak. Did not know if swelling present at time of discharge. He reports this AM he noted swelling in hands and feet. Feels bloated in abdominal area. Not short of breath at present time but reports he has been short of breath during the day with any activity. Weight is 213 lbs today. Had nosebleed this AM. Reports as "pouring and thick" for 30 mins.  Has since stopped and not further bleeding. Takes Lasix 20 in the AM and 40 at bedtime. Took AM dose.  Will review with Dr. Mare Ferrari

## 2014-03-12 NOTE — Telephone Encounter (Signed)
Reviewed with Dr. Mare Ferrari who gave instructions for pt to take additional dose of Lasix 40 mg at this time. He should also take regularly scheduled dose later tonight. I spoke with pt and gave him these instructions. He will continue to weigh daily and let us know if symptoms do not improve.

## 2014-03-12 NOTE — Telephone Encounter (Signed)
New problem   Pt was seen in ED last night and is still having a lot of swelling and fluid. Pt would like to be advise what to do. Please call pt.

## 2014-03-13 ENCOUNTER — Telehealth: Payer: Self-pay | Admitting: Cardiovascular Disease

## 2014-03-13 NOTE — Telephone Encounter (Signed)
NEW PROBLEM:    Pt called about dr Johnsie Cancel Prscribing Norcho 10.325 mg to him please.  PCP  [facility] will not do it if any questions please give him. a call.

## 2014-03-13 NOTE — Telephone Encounter (Signed)
PT  WANTING  DR Johnsie Cancel TO FILL   NORCO    INFORMED PT'S  BROTHER  IN LAW  DR   NISHAN  WILL NOT   FILL  NEEDS  TO  CALL PMD  FOR   REFILLS VERBALIZED  UNDERSTANDING./CY

## 2014-03-14 ENCOUNTER — Telehealth: Payer: Self-pay | Admitting: *Deleted

## 2014-03-14 NOTE — Telephone Encounter (Signed)
LEFT MESSAGE TO CALL BACK  RE  RECEIVED  AN ALERT FROM  [employer]  HEART  FAILURE [employer]  CALLED EARLIER THIS  AM AND PHONE  HAD BEEN DISCONNECTED  PER  BROTHER  IN LAW  PT  TO GET  PHONE  RE ACTIVATED  LATER  TODAY  ./CY

## 2014-03-15 NOTE — Telephone Encounter (Signed)
F/u ° ° °Pt returning your call °

## 2014-03-15 NOTE — Telephone Encounter (Signed)
SPOKE WITH PT  RE MESSAGE  PER PT  WEIGHT  NO  CHANGE  AND  SWELLING IS  BETTER TODAY WILL CONT TO MONITOR  PT  CURRENTLY  TAKING  LASIX   20 MG  AND   40 MG  AT BEDTIME .Adonis Housekeeper

## 2014-03-19 ENCOUNTER — Other Ambulatory Visit: Payer: Self-pay | Admitting: *Deleted

## 2014-03-19 ENCOUNTER — Telehealth: Payer: Self-pay | Admitting: *Deleted

## 2014-03-19 DIAGNOSIS — R0600 Dyspnea, unspecified: Secondary | ICD-10-CM

## 2014-03-19 NOTE — Telephone Encounter (Signed)
S/w pt is going to Dr. Roswell Miners office this am, pt will call me after visit

## 2014-03-20 ENCOUNTER — Other Ambulatory Visit (INDEPENDENT_AMBULATORY_CARE_PROVIDER_SITE_OTHER): Payer: PRIVATE HEALTH INSURANCE

## 2014-03-20 ENCOUNTER — Other Ambulatory Visit: Payer: Self-pay | Admitting: *Deleted

## 2014-03-20 DIAGNOSIS — R0609 Other forms of dyspnea: Secondary | ICD-10-CM

## 2014-03-20 DIAGNOSIS — R0989 Other specified symptoms and signs involving the circulatory and respiratory systems: Secondary | ICD-10-CM

## 2014-03-20 DIAGNOSIS — E876 Hypokalemia: Secondary | ICD-10-CM

## 2014-03-20 DIAGNOSIS — R0600 Dyspnea, unspecified: Secondary | ICD-10-CM

## 2014-03-20 LAB — BASIC METABOLIC PANEL
BUN: 28 mg/dL — ABNORMAL HIGH (ref 6–23)
CO2: 28 mEq/L (ref 19–32)
Calcium: 9.4 mg/dL (ref 8.4–10.5)
Chloride: 104 mEq/L (ref 96–112)
Creatinine, Ser: 1.4 mg/dL (ref 0.4–1.5)
GFR: 54.79 mL/min — ABNORMAL LOW (ref >60.00)
Glucose, Bld: 92 mg/dL (ref 70–99)
Potassium: 3 mEq/L — ABNORMAL LOW (ref 3.5–5.1)
Sodium: 142 mEq/L (ref 135–145)

## 2014-03-20 LAB — BRAIN NATRIURETIC PEPTIDE: Pro B Natriuretic peptide (BNP): 185 pg/mL — ABNORMAL HIGH (ref 0.0–100.0)

## 2014-03-20 MED ORDER — POTASSIUM CHLORIDE CRYS ER 20 MEQ PO TBCR: 20.0000 meq | EXTENDED_RELEASE_TABLET | Freq: Every day | ORAL | Status: DC

## 2014-03-21 ENCOUNTER — Ambulatory Visit (INDEPENDENT_AMBULATORY_CARE_PROVIDER_SITE_OTHER): Payer: Medicare Other | Admitting: Adult Health

## 2014-03-21 ENCOUNTER — Encounter: Payer: Self-pay | Admitting: Adult Health

## 2014-03-21 VITALS — BP 132/68 | HR 89 | Temp 98.4°F | Ht 69.0 in | Wt 217.0 lb

## 2014-03-21 DIAGNOSIS — I509 Heart failure, unspecified: Secondary | ICD-10-CM

## 2014-03-21 DIAGNOSIS — J441 Chronic obstructive pulmonary disease with (acute) exacerbation: Secondary | ICD-10-CM

## 2014-03-21 DIAGNOSIS — I5022 Chronic systolic (congestive) heart failure: Secondary | ICD-10-CM

## 2014-03-21 NOTE — Assessment & Plan Note (Signed)
Recurrent flare   Plan  Finish Avelox as directed.  Finish prednisone taper as directed.-down to prednisone 10mg  daily  Increase Dulera 200/72mcg 2 puffs Twice daily  , until sample is gone then back to [facility] 100 .  Mucinex DM Twice daily  As needed  Cough/congesiton  Fluids and rest .  Please contact office for sooner follow up if symptoms do not improve or worsen or seek emergency care  Follow up in 2 weeks as planned and As needed

## 2014-03-21 NOTE — Assessment & Plan Note (Signed)
Recent flare with hospitalization , improved with diuresis  Cont follow up with CHF clinic/cards.

## 2014-03-21 NOTE — Patient Instructions (Signed)
Finish Avelox as directed.  Finish prednisone taper as directed.-down to prednisone 10mg  daily  Increase Dulera 200/61mcg 2 puffs Twice daily  , until sample is gone then back to [facility] 100 .  Mucinex DM Twice daily  As needed  Cough/congesiton  Fluids and rest .  Please contact office for sooner follow up if symptoms do not improve or worsen or seek emergency care  Follow up in 2 weeks as planned and As needed

## 2014-03-21 NOTE — Progress Notes (Signed)
Subjective:    Patient ID: Lucas [name], male    DOB: [birth year], 68 y.o.   MRN: [id number]  HPI #Smoking  - quit sept 2014 but having withdrawals  #Moderate COPD Spirometry 04/18/12       06/02/12 FEV1 1.64L 46%             1.94  55%   FVC 2.78L 60%               3.01  65% FEV1/FVC 59                   64   #CHF/CAD - s/p CABG. C - Oct 7035 Systolic CHF admission ef 30% - Sept 0093: Diastolic CHF admission  - DEc 2013: EF 30% Admission for Unstable angiona but welll compensated CHF  #Obesity  - Body mass index is 33.15 kg/(m^2). on 08/31/2013 - Have been ruled out January 2015. Advised one liter of oxygen nocturnally    #AECOPD  - 03/26/13 - office Rx - 04/03/13 - AECOPD with Acute Diast CHF admission though 04/06/13 - Jan 2014 with doxy and pred in office with extended pred a week later  #imaging  - 04/03/13 CXR - CHF changes. NEver had CT -   OV 08/31/2013 Chief Complaint  Patient presents with  . COPD    follow-up. Pt states he has been having increased productive cough with yellow phlegm, increased SOB, chest congestion,  whezing and chest tightness x 3 days.   Followup COPD - Gold stage 2, DLCO 46% -  spet 2014,Full PFT   - At last visit mid January 2015 he had a COPD exacerbation with treated with doxycycline and prednisone. He improved after this. In the interim he had a cardiac cath this is listed below. However for the last 3 days is having increased cough, change in color of sputum, increasing wheeze and increased sputum volume. COPD cat score is in the 32s and reflects COPD exacerbation. He takes Combivent respimat which helps him but he does not take ANoro or breo because this does not help him. He is open to taking her steroids  New issue: He is open to having low-dose CT scan of the chest for lung cancer screening because Medicare is paying for it. However, our system not yet set up for this    Past, Family, Social reviewed: Since  last visit he has had  a cardiac catheterization that apparently was clean however, he does have right lower the claudication and apparently he has stenosis. He has a followup with cardiology pending. He is concerned that he might not be a revascularization candidate because of his COPD. In addition Dr. Elsworth Soho for sleep evaluation he tells me that he does not have sleep apnea; confirmed on electronic medical records dated 07/24/2013. His been advised one liters oxygen for sleep    #COPD exacerbation  - you are in flare up of copd again - Take prednisone 40 mg daily x 2 days, then 20mg  daily x 2 days, then 10mg  daily x 2 days, then 5mg  daily x 2 days and stop  take levaquin 500mg  once daily  X 6 days   #COPD Continue combivent respimat 4 times daily Start QVAR 72mcg, 2 puff twice daily Use albuterol 2 puff as needed No need for ANORO or BREO because this is not helping you Continue oxygen 18h/day  #Lung cancer screen  - do LDCT chest at next visit when we have systems in place  #preop  evaluation for vascular surgery  - I think you might be handle surgery for leg but at some risk; we will have to reassess this formally later  #FOllowuo Return to see my NP Tammy for med calendar - next several days to few weeks REturn to see me in 3 months  - spirometry at followujp      11/30/2013 Follow up  Returns for  3 month COPD follow up .  Reports is doing well.  Complains of hoarseness/sore throat .  Has been hospitalized 3x since last ov for CHF/SOB Most recent admit 5/6 for decompensated CHF w/ acute resp fail w/ hypoxia .  He was diuresis with decreased wt and swelling.  Followed closely by cards.  Says swelling is doing much better.   Currently on Dulera Twice daily  And Combivent Four times a day  And uses Duoneb As needed .  Remains on O2 at 2 l/m At bedtime  And with activity As needed   Denies any hemoptysis, fever, or orthopnea, PND, or increased leg swelling   OV 12/31/2013  Chief Complaint    Patient presents with  . Follow-up    Pt c/o dyspnea with exeriton and little ambulation. Pt states he had a blockage in his right calf and had a "balloon placed" last week. Pt c/o dry persistant cough and left chest pain with actvity.     Followup Gold stage II COPD and multiple medical problems  - COPD: Currently disease is stable. His inhaler regimen is weird but he wont change it. HE is on dulera and  combivent scheduled. Also on oi2 18h/day. Has class II dyspnea on exertion. Minimal cough only. His dyspnea is out of proportion to severity of copd and is associated with claudication but no chest pain  - Smoking: He thinks he quit smoking but he is actually smoking electronic cigarettes. I cautioned him that this is bad  - Lung cancer screening: Due to cost and insurance issues he has not had CT scan  - . CAT COPD Symptom & Quality of Life Score (GSK trademark) 0 is no burden. 5 is highest burden 03/26/2013  04/23/2013  07/17/2013  08/31/2013 aecopd  Never Cough -> Cough all the time 4 2 3 5   No phlegm in chest -> Chest is full of phlegm 4 2 3 4   No chest tightness -> Chest feels very tight 3 2 3 4   No dyspnea for 1 flight stairs/hill -> Very dyspneic for 1 flight of stairs 4 4 4 4   No limitations for ADL at home -> Very limited with ADL at home 4 2 3 4   Confident leaving home -> Not at all confident leaving home 2 2 2 4   Sleep soundly -> Do not sleep soundly because of lung condition 4 3 3 4   Lots of Energy -> No energy at all 4 4 4 5   TOTAL Score (max 40)  29 21 25  34   Past medical history reviewed: He continues to have claudication despite balloon placement in his femoral artery according to his history. Dyspnea and claudication occurs at the same time. He also has chronic pain but does not have a pain clinic. Apparently in the hospital Dilaudid helped him and he wants this   01/29/2014 Acute OV  /Post hosp/ER  follow up  COPD , CHF Atrial Fib  Patient presents for a post  hospital followup. He was admitted June 28 through July 1 for COPD, exacerbation, and decompensated, acute on chronic congestive  heart failure. Patient was treated with IV antibiotics, steroids, and nebulized bronchodilators. Patient did have atypical chest pain. He underwent a CT chest angiogram that was negative for PE.  There were very small pulmonary nodules measuring up to 6 mm in the right middle and lower lobes. Patient was seen by cardiology and ruled out for acute MI and treated with medical management. Patient was treated with diuresis. Since discharge. Patient reports that he has continued to have cough, shortness, of breath and wheezing. He was called in doxycycline on July 6, along with a prednisone taper.  Patient was seen in the emergency room on July 14 and given prednisone taper. Patient says that he continues to have muscle cramping. He is currently on Lasix 40 mg daily He denies any hemoptysis, fever, chest pain, vomiting, diarrhea, bloody stools. He does complain of hoarseness.   02/22/2014 [facility] follow up  Returns for post hospital follow up .  Readmitted for COPD and CHF exacerbation on 7/23-28 tx w/ steroids and diuresis .  Continues to have dry cough, wheezing, DOE, PND, edema in abdomen.   Denies hemoptysis, f/c/s, n/v/d.  finished prednisone this morning.  Smoked last 3 weeks ago, we discussed smoking cessation. Uses e cig , we discussed dangers of these as well.  Wt is up today drink 2 Dr. Malachi Bonds and sausge biscuit and gravy. We discussed his salt intake.  More swollen and bloated today.  Followed by CHF clinic. Suppose to be on Lasix 40mg  in am and 20mg  in evening . But only takes 40mg  in evening.  On 2 l/m O2 At bedtime   We discussed diet and med compliance along with need for closer follow up to help keep out of ER and hospital .  >taper pred to 10mg  daily , low salt diet   03/21/2014 Acute OV  COPD former smoker on chronic steroids and Nocturnal O2, CAD,  PAD, CHF (EF40%) , chronic pain on daily narcs  Complains of increased SOB, wheezing, prod cough with yellow mucus, hoarseness x2 days.  Seen by PCP , given   was given Avelox 400mg  x7d and prednisone 10mg  6d taper.  Was recently admitted to hospital for CHF flare, tx w/ diuresis w/ decreased edema. follow up labs yesterday showed declining bnp . K+ was low, rx called in per cards.  Has stopping smoking but now vaping -advised on dangers.  CXR on 8/29 with chronic changes .     Review of Systems  Constitutional: Negative for fever and unexpected weight change.  HENT: Negative for congestion, dental problem, ear pain, nosebleeds, postnasal drip, rhinorrhea, sinus pressure, sneezing, sore throat and trouble swallowing.   Eyes: Negative for redness and itching.  Respiratory: Positive for cough and shortness of breath. Negative for chest tightness and wheezing.   Cardiovascular:  . Negative for palpitations and leg swelling.  Gastrointestinal: Negative for nausea and vomiting.  Genitourinary: Negative for dysuria.  Musculoskeletal: Negative for joint swelling.  Skin: Negative for rash.  Neurological: Negative for headaches.  Hematological: Does not bruise/bleed easily.  Psychiatric/Behavioral: Negative for dysphoric mood. The patient is not nervous/anxious.        Objective:   Physical Exam   HENT:  Head: Normocephalic and atraumatic.  Right Ear: External ear normal.  Left Ear: External ear normal.  Mouth/Throat: Oropharynx is clear and moist.  No thrush noted.  mallampatti class 3-4,Eyes: Conjunctivae and EOM are normal. Pupils are equal, round, and reactive to light. Right eye exhibits no discharge. Left eye  exhibits no discharge. No scleral icterus.  Neck: Normal range of motion. Neck supple. No JVD present. No tracheal deviation present. No thyromegaly present.  Cardiovascular: Normal rate, regular rhythm and intact distal pulses.  Exam reveals no gallop and no friction rub.   No  murmur heard.tr-+ edema  Pulmonary/Chest: scattered rhonchi w/ barking cough  Abdominal: Soft. Bowel sounds are normal. He exhibits no distension and no mass. There is no tenderness. There is no rebound and no guarding.  Musculoskeletal: Normal range of motion. Lymphadenopathy:    He has no cervical adenopathy.  Neurological: He is alert and oriented to person, place, and time. He has normal reflexes. No cranial nerve deficit. Coordination normal.           Assessment & Plan:

## 2014-04-01 ENCOUNTER — Encounter: Payer: PRIVATE HEALTH INSURANCE | Admitting: Surgery

## 2014-04-01 ENCOUNTER — Encounter: Payer: Self-pay | Admitting: Adult Health

## 2014-04-01 ENCOUNTER — Ambulatory Visit (INDEPENDENT_AMBULATORY_CARE_PROVIDER_SITE_OTHER): Payer: Medicare Other | Admitting: Adult Health

## 2014-04-01 VITALS — HR 67 | Temp 98.1°F | Ht 69.0 in | Wt 219.4 lb

## 2014-04-01 DIAGNOSIS — J441 Chronic obstructive pulmonary disease with (acute) exacerbation: Secondary | ICD-10-CM

## 2014-04-01 NOTE — Assessment & Plan Note (Signed)
Resolving exacerbation -discussed  Management of triggers. Steroid pt education w/ risks and benefits discussed  Vaccine importance discussed-pt declines , does say he will consider flu shot on return .    Plan  Continue on  Prednisone 10mg  daily.  Use Claritin 10mg  daily As needed  Drainage.  Mucinex DM Twice daily   As needed  Cough/congestion  Please be careful and do not eat FOODS THAT ARE HIGH IN SODIUM THAT CAUSE YOU TO RETAIN FLUID .  Follow up Dr. Chase Caller in 2 months and As needed   Please contact office for sooner follow up if symptoms do not improve or worsen or seek emergency care

## 2014-04-01 NOTE — Patient Instructions (Signed)
Continue on  Prednisone 10mg  daily.  Use Claritin 10mg  daily As needed  Drainage.  Mucinex DM Twice daily   As needed  Cough/congestion  Please be careful and do not eat FOODS THAT ARE HIGH IN SODIUM THAT CAUSE YOU TO RETAIN FLUID .  Follow up Dr. Chase Caller in 2 months and As needed   Please contact office for sooner follow up if symptoms do not improve or worsen or seek emergency care

## 2014-04-01 NOTE — Progress Notes (Signed)
Subjective:    Patient ID: Lucas [name], male    DOB: Nov 05, 1945, 68 y.o.   MRN: [id number]  HPI #Smoking  - quit sept 2014 but having withdrawals  #Moderate COPD Spirometry 04/18/12       06/02/12 FEV1 1.64L 46%             1.94  55%   FVC 2.78L 60%               3.01  65% FEV1/FVC 59                   64   #CHF/CAD - s/p CABG. C - Oct 2355 Systolic CHF admission ef 30% - Sept 7322: Diastolic CHF admission  - DEc 2013: EF 30% Admission for Unstable angiona but welll compensated CHF  #Obesity  - Body mass index is 33.15 kg/(m^2). on 08/31/2013 - Have been ruled out January 2015. Advised one liter of oxygen nocturnally    #AECOPD  - 03/26/13 - office Rx - 04/03/13 - AECOPD with Acute Diast CHF admission though 04/06/13 - Jan 2014 with doxy and pred in office with extended pred a week later  #imaging  - 04/03/13 CXR - CHF changes. NEver had CT -   OV 08/31/2013 Chief Complaint  Patient presents with  . COPD    follow-up. Pt states he has been having increased productive cough with yellow phlegm, increased SOB, chest congestion,  whezing and chest tightness x 3 days.   Followup COPD - Gold stage 2, DLCO 46% -  spet 2014,Full PFT   - At last visit mid January 2015 he had a COPD exacerbation with treated with doxycycline and prednisone. He improved after this. In the interim he had a cardiac cath this is listed below. However for the last 3 days is having increased cough, change in color of sputum, increasing wheeze and increased sputum volume. COPD cat score is in the 41s and reflects COPD exacerbation. He takes Combivent respimat which helps him but he does not take ANoro or breo because this does not help him. He is open to taking her steroids  New issue: He is open to having low-dose CT scan of the chest for lung cancer screening because Medicare is paying for it. However, our system not yet set up for this    Past, Family, Social reviewed: Since  last visit he has had  a cardiac catheterization that apparently was clean however, he does have right lower the claudication and apparently he has stenosis. He has a followup with cardiology pending. He is concerned that he might not be a revascularization candidate because of his COPD. In addition Lucas [name] for sleep evaluation he tells me that he does not have sleep apnea; confirmed on electronic medical records dated 07/24/2013. His been advised one liters oxygen for sleep    #COPD exacerbation  - you are in flare up of copd again - Take prednisone 40 mg daily x 2 days, then 20mg  daily x 2 days, then 10mg  daily x 2 days, then 5mg  daily x 2 days and stop  take levaquin 500mg  once daily  X 6 days   #COPD Continue combivent respimat 4 times daily Start QVAR 53mcg, 2 puff twice daily Use albuterol 2 puff as needed No need for ANORO or BREO because this is not helping you Continue oxygen 18h/day  #Lung cancer screen  - do LDCT chest at next visit when we have systems in place  #preop  evaluation for vascular surgery  - I think you might be handle surgery for leg but at some risk; we will have to reassess this formally later  #FOllowuo Return to see my NP Lucas [name] for med calendar - next several days to few weeks REturn to see me in 3 months  - spirometry at followujp      11/30/2013 Follow up  Returns for  3 month COPD follow up .  Reports is doing well.  Complains of hoarseness/sore throat .  Has been hospitalized 3x since last ov for CHF/SOB Most recent admit 5/6 for decompensated CHF w/ acute resp fail w/ hypoxia .  He was diuresis with decreased wt and swelling.  Followed closely by cards.  Says swelling is doing much better.   Currently on Dulera Twice daily  And Combivent Four times a day  And uses Duoneb As needed .  Remains on O2 at 2 l/m At bedtime  And with activity As needed   Denies any hemoptysis, fever, or orthopnea, PND, or increased leg swelling   OV 12/31/2013  Chief Complaint    Patient presents with  . Follow-up    Pt c/o dyspnea with exeriton and little ambulation. Pt states he had a blockage in his right calf and had a "balloon placed" last week. Pt c/o dry persistant cough and left chest pain with actvity.     Followup Gold stage II COPD and multiple medical problems  - COPD: Currently disease is stable. His inhaler regimen is weird but he wont change it. HE is on dulera and  combivent scheduled. Also on oi2 18h/day. Has class II dyspnea on exertion. Minimal cough only. His dyspnea is out of proportion to severity of copd and is associated with claudication but no chest pain  - Smoking: He thinks he quit smoking but he is actually smoking electronic cigarettes. I cautioned him that this is bad  - Lung cancer screening: Due to cost and insurance issues he has not had CT scan  - . CAT COPD Symptom & Quality of Life Score (GSK trademark) 0 is no burden. 5 is highest burden 03/26/2013  04/23/2013  07/17/2013  08/31/2013 aecopd  Never Cough -> Cough all the time 4 2 3 5   No phlegm in chest -> Chest is full of phlegm 4 2 3 4   No chest tightness -> Chest feels very tight 3 2 3 4   No dyspnea for 1 flight stairs/hill -> Very dyspneic for 1 flight of stairs 4 4 4 4   No limitations for ADL at home -> Very limited with ADL at home 4 2 3 4   Confident leaving home -> Not at all confident leaving home 2 2 2 4   Sleep soundly -> Do not sleep soundly because of lung condition 4 3 3 4   Lots of Energy -> No energy at all 4 4 4 5   TOTAL Score (max 40)  29 21 25  34   Past medical history reviewed: He continues to have claudication despite balloon placement in his femoral artery according to his history. Dyspnea and claudication occurs at the same time. He also has chronic pain but does not have a pain clinic. Apparently in the hospital Dilaudid helped him and he wants this   01/29/2014 Acute OV  /Post hosp/ER  follow up  COPD , CHF Atrial Fib  Patient presents for a post  hospital followup. He was admitted June 28 through July 1 for COPD, exacerbation, and decompensated, acute on chronic congestive  heart failure. Patient was treated with IV antibiotics, steroids, and nebulized bronchodilators. Patient did have atypical chest pain. He underwent a CT chest angiogram that was negative for PE.  There were very small pulmonary nodules measuring up to 6 mm in the right middle and lower lobes. Patient was seen by cardiology and ruled out for acute MI and treated with medical management. Patient was treated with diuresis. Since discharge. Patient reports that he has continued to have cough, shortness, of breath and wheezing. He was called in doxycycline on July 6, along with a prednisone taper.  Patient was seen in the emergency room on July 14 and given prednisone taper. Patient says that he continues to have muscle cramping. He is currently on Lasix 40 mg daily He denies any hemoptysis, fever, chest pain, vomiting, diarrhea, bloody stools. He does complain of hoarseness.   02/22/2014 [facility] follow up  Returns for post hospital follow up .  Readmitted for COPD and CHF exacerbation on 7/23-28 tx w/ steroids and diuresis .  Continues to have dry cough, wheezing, DOE, PND, edema in abdomen.   Denies hemoptysis, f/c/s, n/v/d.  finished prednisone this morning.  Smoked last 3 weeks ago, we discussed smoking cessation. Uses e cig , we discussed dangers of these as well.  Wt is up today drink 2 Dr. Malachi Bonds and sausge biscuit and gravy. We discussed his salt intake.  More swollen and bloated today.  Followed by CHF clinic. Suppose to be on Lasix 40mg  in am and 20mg  in evening . But only takes 40mg  in evening.  On 2 l/m O2 At bedtime   We discussed diet and med compliance along with need for closer follow up to help keep out of ER and hospital .  >taper pred to 10mg  daily , low salt diet   03/21/2014 Acute OV  COPD former smoker on chronic steroids and Nocturnal O2, CAD,  PAD, CHF (EF40%) , chronic pain on daily narcs  Complains of increased SOB, wheezing, prod cough with yellow mucus, hoarseness x2 days.  Seen by PCP , given   was given Avelox 400mg  x7d and prednisone 10mg  6d taper.  Was recently admitted to hospital for CHF flare, tx w/ diuresis w/ decreased edema. follow up labs yesterday showed declining bnp . K+ was low, rx called in per cards.  Has stopping smoking but now vaping -advised on dangers.  CXR on 8/29 with chronic changes   04/01/2014 Follow up COPD -chronic steroid -pred 10mg  daily and Nocturnal O2 Hx of CAD , CHF (EF40%) , chronic pain on daily narcs.  Returns for 3 month follow COPD Reports breathing is improved since last ov; still having some head and chest congestion w/ PND, DOE, wheezing, chest tightness.  Denies any f/c/s, n/v/d, hemoptysis Had recent flare of COPD , tx w/ Avelox and pred burst. Says he is feeling better.  Very upset with new PCP , d/t tapering off Norco . Has been referred to pain clinic . Says he can not live without pain meds. Is waiting to hear back from pain clinic .  Last CXR 03/09/14 w/ chronic changes , no acute process Declines flu shot today . Discussed importance.  Discussed Pneumovax and Prevnar-declines.        Review of Systems  Constitutional: Negative for fever and unexpected weight change.  HENT: Negative for congestion, dental problem, ear pain, nosebleeds, postnasal drip, rhinorrhea, sinus pressure, sneezing, sore throat and trouble swallowing.   Eyes: Negative for redness and itching.  Respiratory: Positive for cough and shortness of breath. Negative for chest tightness and wheezing.   Cardiovascular:  . Negative for palpitations and leg swelling.  Gastrointestinal: Negative for nausea and vomiting.  Genitourinary: Negative for dysuria.  Musculoskeletal: Negative for joint swelling.  Skin: Negative for rash.  Neurological: Negative for headaches.  Hematological: Does not bruise/bleed easily.   Psychiatric/Behavioral: Negative for dysphoric mood. The patient is not nervous/anxious.        Objective:   Physical Exam   HENT:  Head: Normocephalic and atraumatic.  Right Ear: External ear normal.  Left Ear: External ear normal.  Mouth/Throat: Oropharynx is clear and moist.  No thrush noted.  mallampatti class 3-4,Eyes: Conjunctivae and EOM are normal. Pupils are equal, round, and reactive to light. Right eye exhibits no discharge. Left eye exhibits no discharge. No scleral icterus.  Neck: Normal range of motion. Neck supple. No JVD present. No tracheal deviation present. No thyromegaly present.  Cardiovascular: Normal rate, regular rhythm and intact distal pulses.  Exam reveals no gallop and no friction rub.   No murmur heard.tr-+ edema  Pulmonary/Chest : decreased BS in bases, no wheezing  Abdominal: Soft. Bowel sounds are normal. He exhibits no distension and no mass. There is no tenderness. There is no rebound and no guarding.  Musculoskeletal: Normal range of motion. Lymphadenopathy:    He has no cervical adenopathy.  Neurological: He is alert and oriented to person, place, and time. He has normal reflexes. No cranial nerve deficit. Coordination normal.           Assessment & Plan:

## 2014-04-02 ENCOUNTER — Other Ambulatory Visit: Payer: Self-pay | Admitting: Emergency Medicine

## 2014-04-02 MED ORDER — IPRATROPIUM-ALBUTEROL 20-100 MCG/ACT IN AERS: 1.0000 | INHALATION_SPRAY | Freq: Four times a day (QID) | RESPIRATORY_TRACT | Status: DC

## 2014-04-05 ENCOUNTER — Telehealth: Payer: Self-pay | Admitting: Internal Medicine

## 2014-04-05 MED ORDER — FUROSEMIDE 20 MG PO TABS: ORAL_TABLET | ORAL | Status: DC

## 2014-04-05 NOTE — Telephone Encounter (Signed)
Called spoke with pt. He is needing refill on lasix 20 mg. He takes 20 mg in AM and 40 in PM. Please advise MR if ok to do so thanks

## 2014-04-05 NOTE — Telephone Encounter (Signed)
Ok to do refill on lasix but next time on should be with cardiologist or his pcp Salena Saner., MD  THanks  Dr. Brand Males, M.D., [facility].C.P Pulmonary and Critical Care Medicine Staff Physician [facility] Pager: [phone], If no answer or between  15:00h - 7:00h: call [phone]  04/05/2014 3:55 PM

## 2014-04-05 NOTE — Telephone Encounter (Signed)
RX has been sent in. Pt aware. Nothing further needed 

## 2014-04-09 ENCOUNTER — Ambulatory Visit (INDEPENDENT_AMBULATORY_CARE_PROVIDER_SITE_OTHER): Payer: Medicaid Other | Admitting: Cardiovascular Disease

## 2014-04-09 ENCOUNTER — Encounter: Payer: Self-pay | Admitting: Cardiovascular Disease

## 2014-04-09 VITALS — BP 138/68 | HR 98 | Ht 69.0 in | Wt 220.0 lb

## 2014-04-09 DIAGNOSIS — I2510 Atherosclerotic heart disease of native coronary artery without angina pectoris: Secondary | ICD-10-CM

## 2014-04-09 DIAGNOSIS — J441 Chronic obstructive pulmonary disease with (acute) exacerbation: Secondary | ICD-10-CM | POA: Diagnosis not present

## 2014-04-09 DIAGNOSIS — I739 Peripheral vascular disease, unspecified: Secondary | ICD-10-CM

## 2014-04-09 DIAGNOSIS — I10 Essential (primary) hypertension: Secondary | ICD-10-CM

## 2014-04-09 NOTE — Assessment & Plan Note (Signed)
F/U Dr Trula Slade Has not had much relief of pain with PTA  ABI .5 on right  No skin break down

## 2014-04-09 NOTE — Patient Instructions (Signed)
Your physician wants you to follow-up in:  6 MONTHS WITH DR NISHAN  You will receive a reminder letter in the mail two months in advance. If you don't receive a letter, please call our office to schedule the follow-up appointment. Your physician recommends that you continue on your current medications as directed. Please refer to the Current Medication list given to you today. 

## 2014-04-09 NOTE — Assessment & Plan Note (Signed)
Well controlled.  Continue current medications and low sodium Dash type diet.    

## 2014-04-09 NOTE — Assessment & Plan Note (Signed)
Just Rx with antibiotics for infection Still with abnormal exam  Suggested waiting 3 weeks before gettting flue shot  F/U pulmonary Home oxgyen

## 2014-04-09 NOTE — Assessment & Plan Note (Signed)
Stable with no angina and good activity level.  Continue medical Rx Cath 6/15  [place] to LAD patent and patent [id number]

## 2014-04-09 NOTE — Progress Notes (Signed)
Patient ID: Lucas [name], male   DOB: 1946-05-12, 68 y.o.   MRN: [id number] Lucas [name] is seen back today for a post hospital visit. Seen for Dr. Johnsie Cancel. He is a chronically ill male with multiple issues. These include COPD with chronic respiratory failure on home oxygen at night, chronic systolic HF with EF of 40 to 45%, remote renal cancer with past nephrectomy with CKD stage III, CAD with past CABG with his LIMA to the LAD patent and the SVG to OM3 but all other SVGs remain occluded. His other issues include anxiety, PAF and long standing tobacco abuse, PAD with past angioplasty in June of 2015 and chronic pain. Numerous hospitalizations. He is now on chronic anticoagulation but has had issues in the past with affording the Eliquis.  I have seen him several times - he is trying to have surgical revascularization for his PVD - felt to be a poor candidate from our standpoint.  Just discharged a month ago with recurrent COPD exacerbation/HF.   ABI's August 15  .52 on right .95 on left   Has f/u with [facility]  Upset that IM doctor not getting him Norco      ROS: Denies fever, malais, weight loss, blurry vision, decreased visual acuity, cough, sputum, SOB, hemoptysis, pleuritic pain, palpitaitons, heartburn, abdominal pain, melena, lower extremity edema, claudication, or rash.  All other systems reviewed and negative  General: Anxious /Agitated  Chronically ill white male HEENT: normal Neck supple with no adenopathy JVP normal no bruits no thyromegaly Lungs chronic COPD rhonchi and wheezing and good diaphragmatic motion Heart:  S1/S2 no murmur, no rub, gallop or click PMI normal Abdomen: benighn, BS positve, no tenderness, no AAA no bruit.  No HSM or HJR Distal pulses diminished below knee on right  No edema Neuro non-focal Skin warm and dry No muscular weakness   Current Outpatient Prescriptions  Medication Sig Dispense Refill  . albuterol (PROVENTIL HFA;VENTOLIN HFA) 108 (90  BASE) MCG/ACT inhaler Inhale 2 puffs into the lungs every 6 (six) hours as needed for wheezing or shortness of breath. Shortness of breath  1 Inhaler  5  . albuterol (PROVENTIL) (2.5 MG/3ML) 0.083% nebulizer solution Take 2.5 mg by nebulization 4 (four) times daily.      Marland Kitchen ALPRAZolam (XANAX) 1 MG tablet Take 1 tablet (1 mg total) by mouth 3 (three) times daily as needed for anxiety.  30 tablet  1  . apixaban (ELIQUIS) 5 MG TABS tablet Take 1 tablet (5 mg total) by mouth 2 (two) times daily.  60 tablet  1  . aspirin EC 81 MG tablet Take 81 mg by mouth daily.      Marland Kitchen doxazosin (CARDURA) 8 MG tablet Take 8 mg by mouth at bedtime.       . furosemide (LASIX) 20 MG tablet Take 1 tablet in the morning and 2 tabs at bedtime. SEND FURTHER REFILLS TO PCP or Cardiologists  90 tablet  0  . HYDROcodone-acetaminophen (NORCO) 10-325 MG per tablet Take 1 tablet by mouth 5 (five) times daily.      Marland Kitchen HYDROmorphone (DILAUDID) 4 MG tablet Take 1 tablet (4 mg total) by mouth every 4 (four) hours as needed for moderate pain or severe pain.  30 tablet  0  . ipratropium (ATROVENT) 0.02 % nebulizer solution Take 0.5 mg by nebulization 4 (four) times daily.      . Ipratropium-Albuterol (COMBIVENT RESPIMAT) 20-100 MCG/ACT AERS respimat Inhale 1 puff into the lungs 4 (four) times daily.  1 Inhaler  5  . isosorbide mononitrate (IMDUR) 60 MG 24 hr tablet Take 1 tablet (60 mg total) by mouth daily.  30 tablet  1  . mometasone-formoterol (DULERA) 100-5 MCG/ACT AERO Inhale 2 puffs into the lungs 2 (two) times daily.  1 Inhaler  0  . nicotine (NICODERM CQ - DOSED IN MG/24 HOURS) 21 mg/24hr patch Place 21 mg onto the skin daily.      . nitroGLYCERIN (NITROSTAT) 0.3 MG SL tablet Place 0.3 mg under the tongue every 5 (five) minutes as needed for chest pain.      . predniSONE (DELTASONE) 10 MG tablet Take 1 tablet (10 mg total) by mouth daily with breakfast.  30 tablet  1  . valsartan (DIOVAN) 40 MG tablet Take 40 mg by mouth daily.        . potassium chloride SA (K-DUR,KLOR-CON) 20 MEQ tablet Take 1 tablet (20 mEq total) by mouth daily.  30 tablet  6   No current facility-administered medications for this visit.    Allergies  Ativan; Lisinopril; Morphine and related; and Ibuprofen  Electrocardiogram:  SR PVC  Nonspecific ST changes rate 88  Assessment and Plan

## 2014-04-16 DIAGNOSIS — J9610 Chronic respiratory failure, unspecified whether with hypoxia or hypercapnia: Secondary | ICD-10-CM | POA: Diagnosis present

## 2014-04-16 DIAGNOSIS — Z79899 Other long term (current) drug therapy: Secondary | ICD-10-CM

## 2014-04-16 DIAGNOSIS — Z7982 Long term (current) use of aspirin: Secondary | ICD-10-CM

## 2014-04-16 DIAGNOSIS — Z905 Acquired absence of kidney: Secondary | ICD-10-CM | POA: Diagnosis present

## 2014-04-16 DIAGNOSIS — G8929 Other chronic pain: Secondary | ICD-10-CM | POA: Diagnosis present

## 2014-04-16 DIAGNOSIS — I480 Paroxysmal atrial fibrillation: Secondary | ICD-10-CM | POA: Diagnosis present

## 2014-04-16 DIAGNOSIS — Z7901 Long term (current) use of anticoagulants: Secondary | ICD-10-CM

## 2014-04-16 DIAGNOSIS — J441 Chronic obstructive pulmonary disease with (acute) exacerbation: Secondary | ICD-10-CM | POA: Diagnosis present

## 2014-04-16 DIAGNOSIS — Z7952 Long term (current) use of systemic steroids: Secondary | ICD-10-CM

## 2014-04-16 DIAGNOSIS — E669 Obesity, unspecified: Secondary | ICD-10-CM | POA: Diagnosis present

## 2014-04-16 DIAGNOSIS — N183 Chronic kidney disease, stage 3 (moderate): Secondary | ICD-10-CM | POA: Diagnosis present

## 2014-04-16 DIAGNOSIS — F1120 Opioid dependence, uncomplicated: Secondary | ICD-10-CM | POA: Diagnosis present

## 2014-04-16 DIAGNOSIS — Z951 Presence of aortocoronary bypass graft: Secondary | ICD-10-CM

## 2014-04-16 DIAGNOSIS — E785 Hyperlipidemia, unspecified: Secondary | ICD-10-CM | POA: Diagnosis present

## 2014-04-16 DIAGNOSIS — Z6833 Body mass index (BMI) 33.0-33.9, adult: Secondary | ICD-10-CM

## 2014-04-16 DIAGNOSIS — N400 Enlarged prostate without lower urinary tract symptoms: Secondary | ICD-10-CM | POA: Diagnosis present

## 2014-04-16 DIAGNOSIS — Z955 Presence of coronary angioplasty implant and graft: Secondary | ICD-10-CM

## 2014-04-16 DIAGNOSIS — I25710 Atherosclerosis of autologous vein coronary artery bypass graft(s) with unstable angina pectoris: Secondary | ICD-10-CM | POA: Diagnosis present

## 2014-04-16 DIAGNOSIS — R079 Chest pain, unspecified: Secondary | ICD-10-CM | POA: Diagnosis not present

## 2014-04-16 DIAGNOSIS — I255 Ischemic cardiomyopathy: Secondary | ICD-10-CM | POA: Diagnosis present

## 2014-04-16 DIAGNOSIS — I5023 Acute on chronic systolic (congestive) heart failure: Secondary | ICD-10-CM | POA: Diagnosis present

## 2014-04-16 DIAGNOSIS — I25110 Atherosclerotic heart disease of native coronary artery with unstable angina pectoris: Principal | ICD-10-CM | POA: Diagnosis present

## 2014-04-16 DIAGNOSIS — I129 Hypertensive chronic kidney disease with stage 1 through stage 4 chronic kidney disease, or unspecified chronic kidney disease: Secondary | ICD-10-CM | POA: Diagnosis present

## 2014-04-16 DIAGNOSIS — K219 Gastro-esophageal reflux disease without esophagitis: Secondary | ICD-10-CM | POA: Diagnosis present

## 2014-04-16 DIAGNOSIS — Z8546 Personal history of malignant neoplasm of prostate: Secondary | ICD-10-CM

## 2014-04-16 DIAGNOSIS — I739 Peripheral vascular disease, unspecified: Secondary | ICD-10-CM | POA: Diagnosis present

## 2014-04-16 DIAGNOSIS — Z9981 Dependence on supplemental oxygen: Secondary | ICD-10-CM

## 2014-04-16 DIAGNOSIS — Z87891 Personal history of nicotine dependence: Secondary | ICD-10-CM

## 2014-04-16 DIAGNOSIS — Z85528 Personal history of other malignant neoplasm of kidney: Secondary | ICD-10-CM

## 2014-04-16 DIAGNOSIS — F419 Anxiety disorder, unspecified: Secondary | ICD-10-CM | POA: Diagnosis present

## 2014-04-17 ENCOUNTER — Inpatient Hospital Stay (HOSPITAL_COMMUNITY): Payer: PRIVATE HEALTH INSURANCE

## 2014-04-17 ENCOUNTER — Encounter (HOSPITAL_COMMUNITY): Payer: Self-pay | Admitting: Emergency Medicine

## 2014-04-17 ENCOUNTER — Inpatient Hospital Stay (HOSPITAL_COMMUNITY)
Admission: EM | Admit: 2014-04-17 | Discharge: 2014-04-20 | DRG: 286 | Disposition: A | Discharge status: Home / Self Care | Payer: PRIVATE HEALTH INSURANCE | Attending: Internal Medicine | Admitting: Internal Medicine

## 2014-04-17 ENCOUNTER — Emergency Department (HOSPITAL_COMMUNITY): Payer: PRIVATE HEALTH INSURANCE

## 2014-04-17 DIAGNOSIS — I5023 Acute on chronic systolic (congestive) heart failure: Secondary | ICD-10-CM

## 2014-04-17 DIAGNOSIS — N183 Chronic kidney disease, stage 3 unspecified: Secondary | ICD-10-CM

## 2014-04-17 DIAGNOSIS — Z85528 Personal history of other malignant neoplasm of kidney: Secondary | ICD-10-CM | POA: Diagnosis not present

## 2014-04-17 DIAGNOSIS — I25710 Atherosclerosis of autologous vein coronary artery bypass graft(s) with unstable angina pectoris: Secondary | ICD-10-CM | POA: Diagnosis present

## 2014-04-17 DIAGNOSIS — Z7901 Long term (current) use of anticoagulants: Secondary | ICD-10-CM | POA: Diagnosis not present

## 2014-04-17 DIAGNOSIS — Z955 Presence of coronary angioplasty implant and graft: Secondary | ICD-10-CM | POA: Diagnosis not present

## 2014-04-17 DIAGNOSIS — I5022 Chronic systolic (congestive) heart failure: Secondary | ICD-10-CM | POA: Diagnosis present

## 2014-04-17 DIAGNOSIS — I480 Paroxysmal atrial fibrillation: Secondary | ICD-10-CM | POA: Diagnosis present

## 2014-04-17 DIAGNOSIS — J441 Chronic obstructive pulmonary disease with (acute) exacerbation: Secondary | ICD-10-CM

## 2014-04-17 DIAGNOSIS — F1120 Opioid dependence, uncomplicated: Secondary | ICD-10-CM | POA: Diagnosis present

## 2014-04-17 DIAGNOSIS — Z7952 Long term (current) use of systemic steroids: Secondary | ICD-10-CM | POA: Diagnosis not present

## 2014-04-17 DIAGNOSIS — F17200 Nicotine dependence, unspecified, uncomplicated: Secondary | ICD-10-CM

## 2014-04-17 DIAGNOSIS — Z9981 Dependence on supplemental oxygen: Secondary | ICD-10-CM | POA: Diagnosis not present

## 2014-04-17 DIAGNOSIS — I200 Unstable angina: Secondary | ICD-10-CM

## 2014-04-17 DIAGNOSIS — Z6833 Body mass index (BMI) 33.0-33.9, adult: Secondary | ICD-10-CM | POA: Diagnosis not present

## 2014-04-17 DIAGNOSIS — Z87891 Personal history of nicotine dependence: Secondary | ICD-10-CM | POA: Diagnosis not present

## 2014-04-17 DIAGNOSIS — I10 Essential (primary) hypertension: Secondary | ICD-10-CM

## 2014-04-17 DIAGNOSIS — I739 Peripheral vascular disease, unspecified: Secondary | ICD-10-CM

## 2014-04-17 DIAGNOSIS — Z79899 Other long term (current) drug therapy: Secondary | ICD-10-CM | POA: Diagnosis not present

## 2014-04-17 DIAGNOSIS — E669 Obesity, unspecified: Secondary | ICD-10-CM | POA: Diagnosis present

## 2014-04-17 DIAGNOSIS — Z951 Presence of aortocoronary bypass graft: Secondary | ICD-10-CM | POA: Diagnosis not present

## 2014-04-17 DIAGNOSIS — Z905 Acquired absence of kidney: Secondary | ICD-10-CM | POA: Diagnosis present

## 2014-04-17 DIAGNOSIS — G8929 Other chronic pain: Secondary | ICD-10-CM

## 2014-04-17 DIAGNOSIS — N400 Enlarged prostate without lower urinary tract symptoms: Secondary | ICD-10-CM | POA: Diagnosis present

## 2014-04-17 DIAGNOSIS — I5021 Acute systolic (congestive) heart failure: Secondary | ICD-10-CM

## 2014-04-17 DIAGNOSIS — I2510 Atherosclerotic heart disease of native coronary artery without angina pectoris: Secondary | ICD-10-CM

## 2014-04-17 DIAGNOSIS — Z7982 Long term (current) use of aspirin: Secondary | ICD-10-CM | POA: Diagnosis not present

## 2014-04-17 DIAGNOSIS — I209 Angina pectoris, unspecified: Secondary | ICD-10-CM

## 2014-04-17 DIAGNOSIS — J9610 Chronic respiratory failure, unspecified whether with hypoxia or hypercapnia: Secondary | ICD-10-CM | POA: Diagnosis present

## 2014-04-17 DIAGNOSIS — G4733 Obstructive sleep apnea (adult) (pediatric): Secondary | ICD-10-CM

## 2014-04-17 DIAGNOSIS — J9621 Acute and chronic respiratory failure with hypoxia: Secondary | ICD-10-CM

## 2014-04-17 DIAGNOSIS — I129 Hypertensive chronic kidney disease with stage 1 through stage 4 chronic kidney disease, or unspecified chronic kidney disease: Secondary | ICD-10-CM | POA: Diagnosis present

## 2014-04-17 DIAGNOSIS — Z8546 Personal history of malignant neoplasm of prostate: Secondary | ICD-10-CM | POA: Diagnosis not present

## 2014-04-17 DIAGNOSIS — R079 Chest pain, unspecified: Secondary | ICD-10-CM | POA: Diagnosis present

## 2014-04-17 DIAGNOSIS — I25110 Atherosclerotic heart disease of native coronary artery with unstable angina pectoris: Secondary | ICD-10-CM | POA: Diagnosis present

## 2014-04-17 DIAGNOSIS — E785 Hyperlipidemia, unspecified: Secondary | ICD-10-CM | POA: Diagnosis present

## 2014-04-17 DIAGNOSIS — I255 Ischemic cardiomyopathy: Secondary | ICD-10-CM | POA: Diagnosis present

## 2014-04-17 DIAGNOSIS — K219 Gastro-esophageal reflux disease without esophagitis: Secondary | ICD-10-CM | POA: Diagnosis present

## 2014-04-17 DIAGNOSIS — F419 Anxiety disorder, unspecified: Secondary | ICD-10-CM | POA: Diagnosis present

## 2014-04-17 LAB — BASIC METABOLIC PANEL
ANION GAP: 17 — AB (ref 5–15)
BUN: 18 mg/dL (ref 6–23)
CHLORIDE: 97 meq/L (ref 96–112)
CO2: 22 meq/L (ref 19–32)
CREATININE: 1.13 mg/dL (ref 0.50–1.35)
Calcium: 8.9 mg/dL (ref 8.4–10.5)
GFR calc non Af Amer: 65 mL/min — ABNORMAL LOW (ref >90)
GFR, EST AFRICAN AMERICAN: 75 mL/min — AB (ref >90)
Glucose, Bld: 116 mg/dL — ABNORMAL HIGH (ref 70–99)
POTASSIUM: 3.4 meq/L — AB (ref 3.7–5.3)
Sodium: 136 mEq/L — ABNORMAL LOW (ref 137–147)

## 2014-04-17 LAB — CBC
HCT: 41 % (ref 39.0–52.0)
HCT: 39.2 % (ref 39.0–52.0)
HEMOGLOBIN: 14 g/dL (ref 13.0–17.0)
Hemoglobin: 13.2 g/dL (ref 13.0–17.0)
MCH: 30.2 pg (ref 26.0–34.0)
MCH: 30 pg (ref 26.0–34.0)
MCHC: 34.1 g/dL (ref 30.0–36.0)
MCHC: 33.7 g/dL (ref 30.0–36.0)
MCV: 88.6 fL (ref 78.0–100.0)
MCV: 89.1 fL (ref 78.0–100.0)
Platelets: 173 10*3/uL (ref 150–400)
Platelets: 162 10*3/uL (ref 150–400)
RBC: 4.4 MIL/uL (ref 4.22–5.81)
RBC: 4.63 MIL/uL (ref 4.22–5.81)
RDW: 15.9 % — ABNORMAL HIGH (ref 11.5–15.5)
RDW: 15.8 % — AB (ref 11.5–15.5)
WBC: 6.2 10*3/uL (ref 4.0–10.5)
WBC: 7.7 10*3/uL (ref 4.0–10.5)

## 2014-04-17 LAB — TROPONIN I

## 2014-04-17 LAB — HEPARIN LEVEL (UNFRACTIONATED)
HEPARIN UNFRACTIONATED: 0.77 [IU]/mL — AB (ref 0.30–0.70)
Heparin Unfractionated: 0.72 IU/mL — ABNORMAL HIGH (ref 0.30–0.70)

## 2014-04-17 LAB — I-STAT TROPONIN, ED: TROPONIN I, POC: 0 ng/mL (ref 0.00–0.08)

## 2014-04-17 LAB — GLUCOSE, CAPILLARY
GLUCOSE-CAPILLARY: 180 mg/dL — AB (ref 70–99)
Glucose-Capillary: 166 mg/dL — ABNORMAL HIGH (ref 70–99)

## 2014-04-17 LAB — APTT
APTT: 61 s — AB (ref 24–37)
aPTT: 40 seconds — ABNORMAL HIGH (ref 24–37)

## 2014-04-17 LAB — MRSA PCR SCREENING: MRSA by PCR: NEGATIVE

## 2014-04-17 LAB — PRO B NATRIURETIC PEPTIDE: Pro B Natriuretic peptide (BNP): 1368 pg/mL — ABNORMAL HIGH (ref 0–125)

## 2014-04-17 MED ORDER — ALPRAZOLAM 0.5 MG PO TABS
1.0000 mg | ORAL_TABLET | Freq: Three times a day (TID) | ORAL | Status: DC | PRN
  Administered 2014-04-17 – 2014-04-20 (×4): 1 mg via ORAL
  Filled 2014-04-17 (×2): qty 2
  Filled 2014-04-17: qty 1
  Filled 2014-04-17: qty 2

## 2014-04-17 MED ORDER — ACETAMINOPHEN 325 MG PO TABS: 650.0000 mg | ORAL_TABLET | Freq: Four times a day (QID) | ORAL | Status: DC | PRN

## 2014-04-17 MED ORDER — POTASSIUM CHLORIDE CRYS ER 20 MEQ PO TBCR
20.0000 meq | EXTENDED_RELEASE_TABLET | Freq: Once | ORAL | Status: AC
  Administered 2014-04-17: 20 meq via ORAL
  Filled 2014-04-17: qty 1

## 2014-04-17 MED ORDER — DOXAZOSIN MESYLATE 8 MG PO TABS
8.0000 mg | ORAL_TABLET | Freq: Every day | ORAL | Status: DC
  Administered 2014-04-18 – 2014-04-19 (×3): 8 mg via ORAL
  Filled 2014-04-17 (×5): qty 1

## 2014-04-17 MED ORDER — ONDANSETRON HCL 4 MG PO TABS: 4.0000 mg | ORAL_TABLET | Freq: Four times a day (QID) | ORAL | Status: DC | PRN

## 2014-04-17 MED ORDER — IRBESARTAN 75 MG PO TABS
75.0000 mg | ORAL_TABLET | Freq: Every day | ORAL | Status: DC
  Administered 2014-04-17 – 2014-04-18 (×2): 75 mg via ORAL
  Filled 2014-04-17 (×3): qty 1

## 2014-04-17 MED ORDER — SODIUM CHLORIDE 0.9 % IJ SOLN
3.0000 mL | Freq: Two times a day (BID) | INTRAMUSCULAR | Status: DC
  Administered 2014-04-18 – 2014-04-19 (×4): 3 mL via INTRAVENOUS

## 2014-04-17 MED ORDER — HYDROMORPHONE HCL 1 MG/ML IJ SOLN
1.0000 mg | Freq: Once | INTRAMUSCULAR | Status: AC
  Administered 2014-04-17: 1 mg via INTRAVENOUS
  Filled 2014-04-17: qty 1

## 2014-04-17 MED ORDER — SODIUM CHLORIDE 0.9 % IJ SOLN
3.0000 mL | Freq: Two times a day (BID) | INTRAMUSCULAR | Status: DC
  Administered 2014-04-17 – 2014-04-19 (×5): 3 mL via INTRAVENOUS

## 2014-04-17 MED ORDER — SODIUM CHLORIDE 0.9 % IJ SOLN
3.0000 mL | INTRAMUSCULAR | Status: DC | PRN
Start: 2014-04-17 — End: 2014-04-19

## 2014-04-17 MED ORDER — METHYLPREDNISOLONE SODIUM SUCC 40 MG IJ SOLR
40.0000 mg | Freq: Every day | INTRAMUSCULAR | Status: DC
  Administered 2014-04-17 – 2014-04-19 (×3): 40 mg via INTRAVENOUS
  Filled 2014-04-17 (×3): qty 1

## 2014-04-17 MED ORDER — FUROSEMIDE 10 MG/ML IJ SOLN
20.0000 mg | Freq: Two times a day (BID) | INTRAMUSCULAR | Status: DC
  Administered 2014-04-17 – 2014-04-19 (×5): 20 mg via INTRAVENOUS
  Filled 2014-04-17 (×6): qty 2

## 2014-04-17 MED ORDER — NITROGLYCERIN 0.4 MG/HR TD PT24
0.4000 mg | MEDICATED_PATCH | Freq: Every day | TRANSDERMAL | Status: DC
  Administered 2014-04-17 – 2014-04-18 (×2): 0.4 mg via TRANSDERMAL
  Filled 2014-04-17 (×4): qty 1

## 2014-04-17 MED ORDER — HYDROMORPHONE HCL 1 MG/ML IJ SOLN
1.0000 mg | INTRAMUSCULAR | Status: DC | PRN
  Administered 2014-04-17 – 2014-04-18 (×9): 1 mg via INTRAVENOUS
  Filled 2014-04-17 (×9): qty 1

## 2014-04-17 MED ORDER — ASPIRIN 81 MG PO CHEW
81.0000 mg | CHEWABLE_TABLET | ORAL | Status: AC
  Administered 2014-04-18: 81 mg via ORAL
  Filled 2014-04-17: qty 1

## 2014-04-17 MED ORDER — FENTANYL CITRATE 0.05 MG/ML IJ SOLN
100.0000 ug | Freq: Once | INTRAMUSCULAR | Status: AC
  Administered 2014-04-17: 100 ug via INTRAVENOUS
  Filled 2014-04-17: qty 2

## 2014-04-17 MED ORDER — ONDANSETRON HCL 4 MG/2ML IJ SOLN
4.0000 mg | Freq: Four times a day (QID) | INTRAMUSCULAR | Status: DC | PRN
  Administered 2014-04-17: 4 mg via INTRAVENOUS
  Filled 2014-04-17: qty 2

## 2014-04-17 MED ORDER — HEPARIN (PORCINE) IN NACL 100-0.45 UNIT/ML-% IJ SOLN
1350.0000 [IU]/h | INTRAMUSCULAR | Status: DC
  Administered 2014-04-17 – 2014-04-18 (×3): 1350 [IU]/h via INTRAVENOUS
  Filled 2014-04-17: qty 250

## 2014-04-17 MED ORDER — IPRATROPIUM-ALBUTEROL 0.5-2.5 (3) MG/3ML IN SOLN
3.0000 mL | RESPIRATORY_TRACT | Status: DC
  Administered 2014-04-17 (×3): 3 mL via RESPIRATORY_TRACT
  Filled 2014-04-17 (×3): qty 3

## 2014-04-17 MED ORDER — ASPIRIN EC 325 MG PO TBEC: 325.0000 mg | DELAYED_RELEASE_TABLET | Freq: Every day | ORAL | Status: DC

## 2014-04-17 MED ORDER — POTASSIUM CHLORIDE CRYS ER 20 MEQ PO TBCR
20.0000 meq | EXTENDED_RELEASE_TABLET | Freq: Every day | ORAL | Status: DC
  Administered 2014-04-17 – 2014-04-18 (×2): 20 meq via ORAL
  Filled 2014-04-17 (×3): qty 1

## 2014-04-17 MED ORDER — ASPIRIN EC 81 MG PO TBEC
81.0000 mg | DELAYED_RELEASE_TABLET | Freq: Every day | ORAL | Status: DC
  Administered 2014-04-17 – 2014-04-20 (×3): 81 mg via ORAL
  Filled 2014-04-17 (×4): qty 1

## 2014-04-17 MED ORDER — SODIUM CHLORIDE 0.9 % IV SOLN: 250.0000 mL | INTRAVENOUS | Status: DC | PRN

## 2014-04-17 MED ORDER — ALBUTEROL SULFATE (2.5 MG/3ML) 0.083% IN NEBU: 2.5000 mg | INHALATION_SOLUTION | RESPIRATORY_TRACT | Status: DC | PRN

## 2014-04-17 MED ORDER — IPRATROPIUM BROMIDE 0.02 % IN SOLN: 0.5000 mg | RESPIRATORY_TRACT | Status: DC

## 2014-04-17 MED ORDER — ALBUTEROL SULFATE (2.5 MG/3ML) 0.083% IN NEBU: 2.5000 mg | INHALATION_SOLUTION | RESPIRATORY_TRACT | Status: DC

## 2014-04-17 MED ORDER — IPRATROPIUM-ALBUTEROL 0.5-2.5 (3) MG/3ML IN SOLN
3.0000 mL | Freq: Four times a day (QID) | RESPIRATORY_TRACT | Status: DC
  Administered 2014-04-18 – 2014-04-19 (×7): 3 mL via RESPIRATORY_TRACT
  Filled 2014-04-17 (×5): qty 3
  Filled 2014-04-17: qty 39
  Filled 2014-04-17 (×2): qty 3

## 2014-04-17 MED ORDER — DOXYCYCLINE HYCLATE 100 MG IV SOLR
100.0000 mg | Freq: Two times a day (BID) | INTRAVENOUS | Status: DC
  Administered 2014-04-17 – 2014-04-18 (×3): 100 mg via INTRAVENOUS
  Filled 2014-04-17 (×4): qty 100

## 2014-04-17 MED ORDER — ACETAMINOPHEN 650 MG RE SUPP: 650.0000 mg | Freq: Four times a day (QID) | RECTAL | Status: DC | PRN

## 2014-04-17 MED ORDER — BUDESONIDE 0.25 MG/2ML IN SUSP
0.2500 mg | Freq: Two times a day (BID) | RESPIRATORY_TRACT | Status: DC
  Administered 2014-04-17 – 2014-04-19 (×6): 0.25 mg via RESPIRATORY_TRACT
  Filled 2014-04-17 (×9): qty 2

## 2014-04-17 MED ORDER — HEPARIN (PORCINE) IN NACL 100-0.45 UNIT/ML-% IJ SOLN
1200.0000 [IU]/h | INTRAMUSCULAR | Status: DC
  Administered 2014-04-17: 1200 [IU]/h via INTRAVENOUS
  Administered 2014-04-17: 1000 [IU]/h via INTRAVENOUS
  Filled 2014-04-17 (×3): qty 250

## 2014-04-17 NOTE — H&P (Signed)
[facility] History and Physical  Lucas [name] KYH:[id number] DOB: 04-27-1946 DOA: 04/17/2014  Referring physician: ER physician. PCP: Salena Saner., MD   Chief Complaint: Chest pain.  HPI: Lucas [name] is a 68 y.o. male history of CAD status post CABG, cardiomyopathy last EF measured was 40-45%, paroxysmal atrial fibrillation, COPD on home oxygen, peripheral arterial disease presents to the ER because of chest pain. Patient states he initially had chest pain yesterday morning which lasted for one hour and resolved spontaneously. Later the evening while having supper came back. Pain is retrosternal radiating to back and neck. Nitroglycerin has not helped the pain and patient has been given Dilaudid and fentanyl which helps the pain. On-call cardiologist Dr. Burt Knack was consulted and at this time patient has been started heparin for possible unstable angina and will be admitted for further management. Patient does not want to go to [facility] unless patient is about to receive a procedure. Patient also was initially found to be wheezing and was given nebulizer and steroids. Patient otherwise denies any vomiting abdominal pain fever chills diarrhea. Had mild nausea. Has been having increasing pain in the left hip area over the last 2 days.  Review of Systems: As presented in the history of presenting illness, rest negative.  Past Medical History  Diagnosis Date  . CAD (coronary artery disease)     a. s/p CABG x 5;  b. 04/2012 Cath: patent LIMA->LAD and VG->OM3, all other grafts occluded. c. Cath 12/2013: patent SVG-OM3 & LIMA-LAD, known occ other VG.  Marland Kitchen Hypertension   . Anxiety   . Adjustment disorder with anxiety   . Hyperlipidemia     a. Unable to take statins.  Marland Kitchen BPH (benign prostatic hypertrophy)   . COPD (chronic obstructive pulmonary disease)     a. on home O2.  . Arthritis   . Ischemic cardiomyopathy     a. 06/2013 Echo: EF 30-35%, no reg wma's, Gr2 DD,  triv AI, mod dil LA, nl RV fxn.  . Chronic systolic CHF (congestive heart failure)     a. 06/2013 Echo: EF 30-35%. b. 11/08/13 EF 40-45%, LVH, HK of the mid-distalanterseptal myocardium, trivial Ao regurg, calcified mitral annulus, RA mildly dilated  . Prostate cancer dx'd 2012  . Renal cancer dx'd 1997    lt nephrectomy  . CKD (chronic kidney disease), stage III   . Tobacco abuse     a. 31 yr hx as of 2015, transitioned to e-cig.  Marland Kitchen Syncope     a. 08/2013: the day following cath, no injury, had received multiple doses of Versed for anxiety - this was felt to be a contributing factor.  Marland Kitchen PAF (paroxysmal atrial fibrillation)     a. on Eliquis 5 mg bid  . Hypotension     a. H/o soft BP prohibiting ACEI/ARB use.  . Chronic pain   . PAD (peripheral artery disease)     a. 12/2013: PV angio s/p angioplasty to R external iliac artery stenosis, also has R SFA occlusion with reconstitution of above-knee popliteal artery, 2 vessel runoff via peroneal and posterior tib.b. LE doppler 02/2014 ABI right 0.52, left 0.95  . Pulmonary nodules     a. 12/2013:  small pulmonary nodules measuring up to 25mm in RM/LL, f/u recommended 6-12 months.   Past Surgical History  Procedure Laterality Date  . Coronary artery bypass graft      x 5  . Cardiac catheterization    . Nephrectomy  left nephrectomy for ca  . Posterior cervical laminectomy      x 8   limited ROM  and can't lie flat  . Heart stents      x 5  . Robot assisted laparoscopic radical prostatectomy      for prostate cancer  . Cystoscopy with litholapaxy  05/01/2012    Procedure: CYSTOSCOPY WITH LITHOLAPAXY;  Surgeon: Dutch Gray, MD;  Location: [facility];  Service: Urology;  Laterality: N/A;  . Prostate cancer    . Kidney surgery Left 1994   Social History:  reports that he quit smoking about 2 months ago. His smoking use included Cigarettes. He has a 16.2 pack-year smoking history. He quit smokeless tobacco use about 1 years ago. He reports that  he does not drink alcohol or use illicit drugs. Where does patient live home.  Can patient participate in ADLs? Yes.  Allergies  Allergen Reactions  . Ativan [Lorazepam] Other (See Comments)    Increases agitation, tolerates xanax  . Lisinopril Cough  . Morphine And Related Other (See Comments)    Hallucinations, too sedated when given with ativan  . Ibuprofen Hives, Nausea And Vomiting and Rash    Tolerates baby aspirin per patient.      Family History:  Family History  Problem Relation Age of Onset  . Diabetes Mother   . Heart disease Mother   . Hypertension Mother   . Heart attack Mother   . Coronary artery disease    . Heart disease Father   . Diabetes Father   . Heart attack Father   . Heart disease Brother   . Heart attack Brother   . COPD Sister       Prior to Admission medications   Medication Sig Start Date End Date Taking? Authorizing Provider  albuterol (PROVENTIL HFA;VENTOLIN HFA) 108 (90 BASE) MCG/ACT inhaler Inhale 2 puffs into the lungs every 6 (six) hours as needed for wheezing or shortness of breath. Shortness of breath 02/27/14  Yes Brand Males, MD  albuterol (PROVENTIL) (2.5 MG/3ML) 0.083% nebulizer solution Take 2.5 mg by nebulization 4 (four) times daily. 04/06/13  Yes Erick Colace, NP  ALPRAZolam Duanne Moron) 1 MG tablet Take 1 tablet (1 mg total) by mouth 3 (three) times daily as needed for anxiety. 11/09/13  Yes Theodis Blaze, MD  apixaban (ELIQUIS) 5 MG TABS tablet Take 1 tablet (5 mg total) by mouth 2 (two) times daily. 11/09/13  Yes Theodis Blaze, MD  aspirin EC 81 MG tablet Take 81 mg by mouth daily.   Yes Historical Provider, MD  doxazosin (CARDURA) 8 MG tablet Take 8 mg by mouth at bedtime.    Yes Historical Provider, MD  furosemide (LASIX) 20 MG tablet Take 1 tablet in the morning and 2 tabs at bedtime. SEND FURTHER REFILLS TO PCP or Cardiologists 04/05/14  Yes Brand Males, MD  HYDROcodone-acetaminophen (NORCO) 10-325 MG per tablet Take 1 tablet  by mouth 5 (five) times daily.   Yes Historical Provider, MD  HYDROmorphone (DILAUDID) 4 MG tablet Take 1 tablet (4 mg total) by mouth every 4 (four) hours as needed for moderate pain or severe pain. 03/11/14  Yes Ryan M Dunn, PA-C  ipratropium (ATROVENT) 0.02 % nebulizer solution Take 0.5 mg by nebulization 4 (four) times daily.   Yes Historical Provider, MD  Ipratropium-Albuterol (COMBIVENT RESPIMAT) 20-100 MCG/ACT AERS respimat Inhale 1 puff into the lungs 4 (four) times daily. 04/02/14  Yes Brand Males, MD  mometasone-formoterol (DULERA) 100-5 MCG/ACT  AERO Inhale 2 puffs into the lungs 2 (two) times daily. 11/02/13  Yes Domenic Polite, MD  nitroGLYCERIN (NITROSTAT) 0.3 MG SL tablet Place 0.3 mg under the tongue every 5 (five) minutes as needed for chest pain.   Yes Historical Provider, MD  potassium chloride SA (K-DUR,KLOR-CON) 20 MEQ tablet Take 1 tablet (20 mEq total) by mouth daily. 03/20/14  Yes Burtis Junes, NP  predniSONE (DELTASONE) 10 MG tablet Take 1 tablet (10 mg total) by mouth daily with breakfast. 03/11/14  Yes Ryan M Dunn, PA-C  valsartan (DIOVAN) 40 MG tablet Take 40 mg by mouth daily.  02/13/14  Yes Historical Provider, MD    Physical Exam: Filed Vitals:   04/16/14 2358 04/17/14 0002 04/17/14 0118 04/17/14 0118  BP: 140/98 114/62    Pulse:  96    Temp:  98.5 F (36.9 C)    TempSrc:  Oral    Resp:  24    Height:   5\' 9"  (1.753 m)   Weight:   99.791 kg (220 lb) 97.16 kg (214 lb 3.2 oz)  SpO2:  98%       General:  Well built and nourished.   Eyes: Anicteric no pallor.   ENT: No discharge from ears eyes nose mouth.   Neck: No mass felt no JVD appreciated.   Cardiovascular: S1-S2 heard.   Respiratory: No rhonchi or crepitations.   Abdomen: Soft nontender bowel sounds present.   Skin: No rash.   Musculoskeletal: Pain on moving left hip.   Psychiatric: Appears normal.   Neurologic: Alert awake oriented to time place and person. Moves all extremities.  Labs  on Admission:  Basic Metabolic Panel:  Recent Labs Lab 04/17/14 0049  NA 136*  K 3.4*  CL 97  CO2 22  GLUCOSE 116*  BUN 18  CREATININE 1.13  CALCIUM 8.9   Liver Function Tests: No results found for this basename: AST, ALT, ALKPHOS, BILITOT, PROT, ALBUMIN,  in the last 168 hours No results found for this basename: LIPASE, AMYLASE,  in the last 168 hours No results found for this basename: AMMONIA,  in the last 168 hours CBC:  Recent Labs Lab 04/17/14 0049  WBC 7.7  HGB 14.0  HCT 41.0  MCV 88.6  PLT 173   Cardiac Enzymes: No results found for this basename: CKTOTAL, CKMB, CKMBINDEX, TROPONINI,  in the last 168 hours  BNP (last 3 results)  Recent Labs  01/31/14 1724 03/08/14 1154 03/20/14 0906  PROBNP 1616.0* 795.4* 185.0*   CBG: No results found for this basename: GLUCAP,  in the last 168 hours  Radiological Exams on Admission: Dg Chest Port 1 View  04/17/2014   CLINICAL DATA:  Initial encounter for severe chest pain.  EXAM: PORTABLE CHEST - 1 VIEW  COMPARISON:  Two-view chest 03/09/2014.  FINDINGS: Heart is mildly enlarged. Lung volumes are low. Mild bibasilar airspace disease likely reflects atelectasis. The patient is status post median sternotomy for CABG. Mild interstitial prominence is increased.  IMPRESSION: 1. Mild cardiac enlargement and increasing interstitial prominence suggesting mild edema. 2. Mild bibasilar airspace disease likely reflects atelectasis. Early infection is considered less likely.   Electronically Signed   By: Lawrence Santiago M.D.   On: 04/17/2014 01:28    EKG: Independently reviewed. Normal sinus rhythm with poor R-wave progression.  Assessment/Plan Principal Problem:   Unstable angina Active Problems:   Essential hypertension   PAD (peripheral artery disease)   PAF (paroxysmal atrial fibrillation)   CKD (chronic kidney disease),  stage III   Chronic systolic CHF (congestive heart failure)   COPD exacerbation   1. Unstable  angina - patient's symptoms are concerning for unstable angina. At this time cardiology will be seeing patient in consult. Patient became n.p.o. in anticipation of possible cardiac procedure. Continue heparin infusion and place patient on nitroglycerin patch. Aspirin. Cycle cardiac markers. On as needed Dilaudid. 2. COPD with mild exacerbation - continue nebulizer Pulmicort and steroids. Doxycycline. 3. Cardiomyopathy last EF was 40-45% - patient is on Lasix 20 mg IV every 12 hourly. Closely follow intake output and metabolic panel. 4. History of paroxysmal atrial fibrillation - presently in sinus rhythm. On heparin. Patient is on apixaban in home. 5. Left hip pain - check left hip x-ray. 6. Peripheral arterial disease - presently on anticoagulation. 7. Chronic kidney disease with history of nephrectomy for renal cell carcinoma, stage III chronic kidney disease - closely follow metabolic panel intake output.    Code Status: Full code.  Family Communication: None.  Disposition Plan: Admit to inpatient.    Tattiana Fakhouri N. [facility] Pager [phone].  If 7PM-7AM, please contact night-coverage [email or web address] Password [email or web address] 04/17/2014, 3:11 AM

## 2014-04-17 NOTE — Progress Notes (Signed)
Pt being transferred to [facility], [place]. Reported called, no immediate concerns or questions; pt stable at this time.  Called [facility] for transport, awaiting [facility] at this time

## 2014-04-17 NOTE — ED Notes (Signed)
Bed: RESA Expected date: 04/16/14 Expected time: 11:35 PM Means of arrival: Ambulance Comments: 68 yo M  Shortness of breath, chest pain

## 2014-04-17 NOTE — Patient Instructions (Signed)
Transitional Care Clinic Appointment: 04/26/14 at 10:00 am       Location:  [place], [place], [place] [address]

## 2014-04-17 NOTE — Progress Notes (Signed)
ANTICOAGULATION CONSULT NOTE - Follow Up Consult   HL = 0.72 (goal 0.3 - 0.7 units/mL) APTT = 61 (goal 66 - 102 sec) Heparin dosing weight = 91 kg   Assessment: 67 with PAF to continue on IV heparin while Eliquis is on hold.  Currently using both heparin levels and aPTTs to assess heparin dosing because Eliquis could falsely elevated heparin levels.  Heparin level remains slightly supra-therapeutic and aPTT is trending up but not yet therapeutic.  No bleeding reported.   Plan: - Increase heparin gtt to 1350 units/hr - Check 6 hr aPTT    Jaquawn Saffran D. Mina Marble, PharmD, BCPS Pager:  [phone] 04/17/2014, 8:58 PM

## 2014-04-17 NOTE — ED Notes (Signed)
Pt arrived to the ED via EMS after refusing to go to [facility] with a complaint of chest pain.  Pt has a history of MI and CABG.  Pt states that his pain is located on the left side.  Pt states he had pain this am and it has gone away but returned this evening.  Pt is short of breath.  Pt has had 3 doses of nitro, 324 mg of Asprin, 10 mg of albuterol, 1 Atrovent treatment, 125 mg of solumedrol IVP, 150 cc of Normal saline.  Pt had a SPO2 of 90% prior to breathing treatments.  Pt now is 100% SPO2 after his treatment.

## 2014-04-17 NOTE — ED Provider Notes (Signed)
CSN: [id number]     Arrival date & time 04/16/14  2357 History   First MD Initiated Contact with Patient 04/17/14 0016     Chief Complaint  Patient presents with  . Chest Pain     (Consider location/radiation/quality/duration/timing/severity/associated sxs/prior Treatment) Patient is a 68 y.o. male presenting with chest pain. The history is provided by the patient and medical records.  Chest Pain Pain location:  L chest Pain quality: sharp   Pain radiates to:  L jaw Pain radiates to the back: no   Pain severity:  Severe Onset quality:  Sudden Duration:  1 day Timing:  Constant Progression:  Waxing and waning Chronicity:  Recurrent Ineffective treatments:  Nitroglycerin and aspirin Associated symptoms: nausea and shortness of breath   Associated symptoms: no cough, no diaphoresis, no dizziness, no fever and no headache   Risk factors: coronary artery disease     Past Medical History  Diagnosis Date  . CAD (coronary artery disease)     a. s/p CABG x 5;  b. 04/2012 Cath: patent LIMA->LAD and VG->OM3, all other grafts occluded. c. Cath 12/2013: patent SVG-OM3 & LIMA-LAD, known occ other VG.  Marland Kitchen Hypertension   . Anxiety   . Adjustment disorder with anxiety   . Hyperlipidemia     a. Unable to take statins.  Marland Kitchen BPH (benign prostatic hypertrophy)   . COPD (chronic obstructive pulmonary disease)     a. on home O2.  . Arthritis   . Ischemic cardiomyopathy     a. 06/2013 Echo: EF 30-35%, no reg wma's, Gr2 DD, triv AI, mod dil LA, nl RV fxn.  . Chronic systolic CHF (congestive heart failure)     a. 06/2013 Echo: EF 30-35%. b. 11/08/13 EF 40-45%, LVH, HK of the mid-distalanterseptal myocardium, trivial Ao regurg, calcified mitral annulus, RA mildly dilated  . Prostate cancer dx'd 2012  . Renal cancer dx'd 1997    lt nephrectomy  . CKD (chronic kidney disease), stage III   . Tobacco abuse     a. 61 yr hx as of 2015, transitioned to e-cig.  Marland Kitchen Syncope     a. 08/2013: the day following  cath, no injury, had received multiple doses of Versed for anxiety - this was felt to be a contributing factor.  Marland Kitchen PAF (paroxysmal atrial fibrillation)     a. on Eliquis 5 mg bid  . Hypotension     a. H/o soft BP prohibiting ACEI/ARB use.  . Chronic pain   . PAD (peripheral artery disease)     a. 12/2013: PV angio s/p angioplasty to R external iliac artery stenosis, also has R SFA occlusion with reconstitution of above-knee popliteal artery, 2 vessel runoff via peroneal and posterior tib.b. LE doppler 02/2014 ABI right 0.52, left 0.95  . Pulmonary nodules     a. 12/2013:  small pulmonary nodules measuring up to 74mm in RM/LL, f/u recommended 6-12 months.   Past Surgical History  Procedure Laterality Date  . Coronary artery bypass graft      x 5  . Cardiac catheterization    . Nephrectomy      left nephrectomy for ca  . Posterior cervical laminectomy      x 8   limited ROM  and can't lie flat  . Heart stents      x 5  . Robot assisted laparoscopic radical prostatectomy      for prostate cancer  . Cystoscopy with litholapaxy  05/01/2012    Procedure: CYSTOSCOPY WITH LITHOLAPAXY;  Surgeon: Dutch Gray, MD;  Location: [facility];  Service: Urology;  Laterality: N/A;  . Prostate cancer    . Kidney surgery Left 1994   Family History  Problem Relation Age of Onset  . Diabetes Mother   . Heart disease Mother   . Hypertension Mother   . Heart attack Mother   . Coronary artery disease    . Heart disease Father   . Diabetes Father   . Heart attack Father   . Heart disease Brother   . Heart attack Brother   . COPD Sister    History  Substance Use Topics  . Smoking status: Former Smoker -- 0.30 packs/day for 54 years    Types: Cigarettes    Quit date: 02/04/2014  . Smokeless tobacco: Former Systems developer    Quit date: 04/19/2012     Comment: Using e-cig now  . Alcohol Use: No    Review of Systems  Constitutional: Positive for activity change. Negative for fever, chills and diaphoresis.   Eyes: Negative for visual disturbance.  Respiratory: Positive for shortness of breath. Negative for cough and chest tightness.   Cardiovascular: Positive for chest pain.  Gastrointestinal: Positive for nausea. Negative for abdominal distention.  Genitourinary: Negative for dysuria, enuresis and difficulty urinating.  Musculoskeletal: Negative for arthralgias and neck pain.  Neurological: Negative for dizziness, light-headedness and headaches.  Hematological: Bruises/bleeds easily.  Psychiatric/Behavioral: Negative for confusion.      Allergies  Ativan; Lisinopril; Morphine and related; and Ibuprofen  Home Medications   Prior to Admission medications   Medication Sig Start Date End Date Taking? Authorizing Provider  albuterol (PROVENTIL HFA;VENTOLIN HFA) 108 (90 BASE) MCG/ACT inhaler Inhale 2 puffs into the lungs every 6 (six) hours as needed for wheezing or shortness of breath. Shortness of breath 02/27/14  Yes Brand Males, MD  albuterol (PROVENTIL) (2.5 MG/3ML) 0.083% nebulizer solution Take 2.5 mg by nebulization 4 (four) times daily. 04/06/13  Yes Erick Colace, NP  ALPRAZolam Duanne Moron) 1 MG tablet Take 1 tablet (1 mg total) by mouth 3 (three) times daily as needed for anxiety. 11/09/13  Yes Theodis Blaze, MD  apixaban (ELIQUIS) 5 MG TABS tablet Take 1 tablet (5 mg total) by mouth 2 (two) times daily. 11/09/13  Yes Theodis Blaze, MD  aspirin EC 81 MG tablet Take 81 mg by mouth daily.   Yes Historical Provider, MD  doxazosin (CARDURA) 8 MG tablet Take 8 mg by mouth at bedtime.    Yes Historical Provider, MD  furosemide (LASIX) 20 MG tablet Take 1 tablet in the morning and 2 tabs at bedtime. SEND FURTHER REFILLS TO PCP or Cardiologists 04/05/14  Yes Brand Males, MD  HYDROcodone-acetaminophen (NORCO) 10-325 MG per tablet Take 1 tablet by mouth 5 (five) times daily.   Yes Historical Provider, MD  HYDROmorphone (DILAUDID) 4 MG tablet Take 1 tablet (4 mg total) by mouth every 4 (four)  hours as needed for moderate pain or severe pain. 03/11/14  Yes Ryan M Dunn, PA-C  ipratropium (ATROVENT) 0.02 % nebulizer solution Take 0.5 mg by nebulization 4 (four) times daily.   Yes Historical Provider, MD  Ipratropium-Albuterol (COMBIVENT RESPIMAT) 20-100 MCG/ACT AERS respimat Inhale 1 puff into the lungs 4 (four) times daily. 04/02/14  Yes Brand Males, MD  mometasone-formoterol (DULERA) 100-5 MCG/ACT AERO Inhale 2 puffs into the lungs 2 (two) times daily. 11/02/13  Yes Domenic Polite, MD  nitroGLYCERIN (NITROSTAT) 0.3 MG SL tablet Place 0.3 mg under the tongue every 5 (five)  minutes as needed for chest pain.   Yes Historical Provider, MD  potassium chloride SA (K-DUR,KLOR-CON) 20 MEQ tablet Take 1 tablet (20 mEq total) by mouth daily. 03/20/14  Yes Burtis Junes, NP  predniSONE (DELTASONE) 10 MG tablet Take 1 tablet (10 mg total) by mouth daily with breakfast. 03/11/14  Yes Ryan M Dunn, PA-C  valsartan (DIOVAN) 40 MG tablet Take 40 mg by mouth daily.  02/13/14  Yes Historical Provider, MD   BP 114/62  Pulse 96  Temp(Src) 98.5 F (36.9 C) (Oral)  Resp 24  Ht 5\' 9"  (1.753 m)  Wt 214 lb 3.2 oz (97.16 kg)  BMI 31.62 kg/m2  SpO2 98% Physical Exam  Nursing note and vitals reviewed. Constitutional: He is oriented to person, place, and time. He appears well-developed.  HENT:  Head: Normocephalic and atraumatic.  Eyes: Conjunctivae and EOM are normal. Pupils are equal, round, and reactive to light.  Neck: Normal range of motion. Neck supple.  Cardiovascular: Normal rate, regular rhythm and intact distal pulses.   Murmur heard. Pulmonary/Chest: Effort normal and breath sounds normal.  Abdominal: Soft. Bowel sounds are normal. He exhibits no distension. There is no tenderness. There is no rebound and no guarding.  Neurological: He is alert and oriented to person, place, and time.  Skin: Skin is warm.    ED Course  Procedures (including critical care time) Labs Review Labs Reviewed   BASIC METABOLIC PANEL - Abnormal; Notable for the following:    Sodium 136 (*)    Potassium 3.4 (*)    Glucose, Bld 116 (*)    GFR calc non Af Amer 65 (*)    GFR calc Af Amer 75 (*)    Anion gap 17 (*)    All other components within normal limits  CBC - Abnormal; Notable for the following:    RDW 15.9 (*)    All other components within normal limits  PRO B NATRIURETIC PEPTIDE  HEPARIN LEVEL (UNFRACTIONATED)  CBC  APTT  I-STAT TROPOININ, ED    Imaging Review Dg Chest Port 1 View  04/17/2014   CLINICAL DATA:  Initial encounter for severe chest pain.  EXAM: PORTABLE CHEST - 1 VIEW  COMPARISON:  Two-view chest 03/09/2014.  FINDINGS: Heart is mildly enlarged. Lung volumes are low. Mild bibasilar airspace disease likely reflects atelectasis. The patient is status post median sternotomy for CABG. Mild interstitial prominence is increased.  IMPRESSION: 1. Mild cardiac enlargement and increasing interstitial prominence suggesting mild edema. 2. Mild bibasilar airspace disease likely reflects atelectasis. Early infection is considered less likely.   Electronically Signed   By: Lawrence Santiago M.D.   On: 04/17/2014 01:28     EKG Interpretation   Date/Time:  Wednesday April 17 2014 00:01:23 EDT Ventricular Rate:  99 PR Interval:  158 QRS Duration: 97 QT Interval:  408 QTC Calculation: 524 R Axis:   45 Text Interpretation:  Sinus rhythm Anterior infarct, old Prolonged QT  interval ST elevation avR ST depression in lateral leads Nonspecific ST  and T wave abnormality Confirmed by Kathrynn Humble, MD, Izacc Demeyer [phone]) on  04/17/2014 12:17:56 AM      MDM   Final diagnoses:  Unstable angina  Ischemic chest pain    CRITICAL CARE Performed by: Varney Biles   Total critical care time: 50 MINUTES  Critical care time was exclusive of separately billable procedures and treating other patients.  Critical care was necessary to treat or prevent imminent or life-threatening  deterioration.  Critical care was  time spent personally by me on the following activities: development of treatment plan with patient and/or surrogate as well as nursing, discussions with consultants, evaluation of patient's response to treatment, examination of patient, obtaining history from patient or surrogate, ordering and performing treatments and interventions, ordering and review of laboratory studies, ordering and review of radiographic studies, pulse oximetry and re-evaluation of patient's condition.   Pt comes in with cc of chest pain. Pt has known CAD, COPD, CHF hx. Pt has new EKG changes - ST elevation in avR and ST depression in lateral leads, with known multivessel dz, including LAD. Though there is no clear criteria for STEMI, spoke with Dr. Burt Knack, [facility] immediately, and he actually knows the patient, and his coronary disease well. He wanted [place] to treat medically, get serial enzymes, and transfer patient to [facility]. He agrees that there are some ischemic changes on the ekg.  PT however is refusing transfer to [facility], unless he is getting Cath, due to prior negative experience at [facility].   Patient understands that his/her actions will lead to inadequate medical workup, and that he/she is at risk of complications of missed diagnosis, and improper and delayed diagnostic and therapeutic workup, which includes morbidity and mortality.  Patient is demonstrating good capacity to make decision. He wants admission at [facility], and transfer to [facility] is cath is needed.  Cards made aware of this, and Dr. Burt Knack will have the Cards team see him at [facility] in the AM.  Pt has no response to nitro. Pt has been given 5 nitro and full dose ASA by EMS. Heparin started here. Admit.  Varney Biles, MD 04/17/14 [phone]

## 2014-04-17 NOTE — Progress Notes (Signed)
Patient seen and examined this morning, admitted overnight, agree with H&P.  Unstable angina - continue ACS treatment, cardiology consulted, discussed with them this morning, will transfer patient to [facility] as he will likely need a repeat cardiac catheterization, timing to be determined per cardiology  COPD with mild exacerbation - continue breathing treatments, doxycycline, steroids Acute on chronic systolic heart failure - in the setting of ischemic cardiomyopathy, Lasix, strict I&O, daily weights History of A fib - currently in sinus CKD III - Cr at baseline  Plan discussed with Dr. Johnsie Cancel from cardiology and Dr. Dyann Kief with [facility] at [facility]. Plan for transfer today.  Costin M. Cruzita Lederer, MD [facility] [phone]

## 2014-04-17 NOTE — Progress Notes (Addendum)
ANTICOAGULATION CONSULT NOTE - Initial Consult  Pharmacy Consult for Heparin Indication: chest pain/ACS  Allergies  Allergen Reactions  . Ativan [Lorazepam] Other (See Comments)    Increases agitation, tolerates xanax  . Lisinopril Cough  . Morphine And Related Other (See Comments)    Hallucinations, too sedated when given with ativan  . Ibuprofen Hives, Nausea And Vomiting and Rash    Tolerates baby aspirin per patient.      Patient Measurements: Height: 5\' 9"  (175.3 cm) Weight: 214 lb 3.2 oz (97.16 kg) IBW/kg (Calculated) : 70.7 Heparin Dosing Weight:   Vital Signs: Temp: 98.5 F (36.9 C) (10/07 0002) Temp Source: Oral (10/07 0002) BP: 114/62 mmHg (10/07 0002) Pulse Rate: 96 (10/07 0002)  Labs:  Recent Labs  04/17/14 0049  HGB 14.0  HCT 41.0  PLT 173  CREATININE 1.13    Estimated Creatinine Clearance: 71.9 ml/min (by C-G formula based on Cr of 1.13).   Medical History: Past Medical History  Diagnosis Date  . CAD (coronary artery disease)     a. s/p CABG x 5;  b. 04/2012 Cath: patent LIMA->LAD and VG->OM3, all other grafts occluded. c. Cath 12/2013: patent SVG-OM3 & LIMA-LAD, known occ other VG.  Marland Kitchen Hypertension   . Anxiety   . Adjustment disorder with anxiety   . Hyperlipidemia     a. Unable to take statins.  Marland Kitchen BPH (benign prostatic hypertrophy)   . COPD (chronic obstructive pulmonary disease)     a. on home O2.  . Arthritis   . Ischemic cardiomyopathy     a. 06/2013 Echo: EF 30-35%, no reg wma's, Gr2 DD, triv AI, mod dil LA, nl RV fxn.  . Chronic systolic CHF (congestive heart failure)     a. 06/2013 Echo: EF 30-35%. b. 11/08/13 EF 40-45%, LVH, HK of the mid-distalanterseptal myocardium, trivial Ao regurg, calcified mitral annulus, RA mildly dilated  . Prostate cancer dx'd 2012  . Renal cancer dx'd 1997    lt nephrectomy  . CKD (chronic kidney disease), stage III   . Tobacco abuse     a. 86 yr hx as of 2015, transitioned to e-cig.  Marland Kitchen Syncope     a.  08/2013: the day following cath, no injury, had received multiple doses of Versed for anxiety - this was felt to be a contributing factor.  Marland Kitchen PAF (paroxysmal atrial fibrillation)     a. on Eliquis 5 mg bid  . Hypotension     a. H/o soft BP prohibiting ACEI/ARB use.  . Chronic pain   . PAD (peripheral artery disease)     a. 12/2013: PV angio s/p angioplasty to R external iliac artery stenosis, also has R SFA occlusion with reconstitution of above-knee popliteal artery, 2 vessel runoff via peroneal and posterior tib.b. LE doppler 02/2014 ABI right 0.52, left 0.95  . Pulmonary nodules     a. 12/2013:  small pulmonary nodules measuring up to 66mm in RM/LL, f/u recommended 6-12 months.    Medications:  Infusions:  . heparin 1,000 Units/hr (04/17/14 0131)    Assessment: Patient with ACS/CP.  Prior to admission patient taking apixaban, last dose noted 10/6.    Goal of Therapy:  Heparin level 0.3-0.7 units/ml Monitor platelets by anticoagulation protocol: Yes   Plan:  Due to prior apixaban dose, not loading. Heparin drip at 1000 units/hr Heparin level/PTT at 1000 CBC daily. PTT ordered with Heparin level until both correlate due to possible drug-lab interaction between apixaban and anti-Xa level (aka heparin level)  Tyler Deis, Shea Stakes Crowford 04/17/2014,1:57 AM

## 2014-04-17 NOTE — Progress Notes (Signed)
UR completed Toneisha Savary K. Jareth Pardee, RN, BSN, [place], CCM  04/17/2014 3:37 PM

## 2014-04-17 NOTE — Care Management Note (Addendum)
  Page 1 of 1   04/17/2014     3:36:14 PM CARE MANAGEMENT NOTE 04/17/2014  Patient:  Lucas [name], Lucas [name]   Account Number:  [id number]  Date Initiated:  04/17/2014  Documentation initiated by:  Shamanda Len  Subjective/Objective Assessment:   COPD, Chest Pain     Action/Plan:   CM to follow for disposition needs   Anticipated DC Date:  04/20/2014   Anticipated DC Plan:  HOME/SELF CARE         Choice offered to / List presented to:             Status of service:   Medicare Important Message given?   (If response is "NO", the following Medicare IM given date fields will be blank) Date Medicare IM given:   Medicare IM given by:   Date Additional Medicare IM given:   Additional Medicare IM given by:    Discharge Disposition:    Per UR Regulation:  Reviewed for med. necessity/level of care/duration of stay  If discussed at [place] of Stay Meetings, dates discussed:    Comments:  Kalle Bernath RN, BSN, M[facility], CCM  Nurse - Case Manager,  (Unit [phone]  04/17/2014 Disposition Plan:  pending.

## 2014-04-17 NOTE — Progress Notes (Signed)
On chart, pt has allergy to NSAID but says he can tolerate 81mg  ASA. [facility] doc ordered 325mg  ASA due to pt's unstable angina. This NP asked RN to speak to pt about ? allergy and if he is able to take 325mg . This NP heard conversation. Pt states he had some nose bleeds on 325mg  and cardio reduced the dose to 81mg . Even though explanation was provided that he needs 325mg  now secondary to his problems, he refuses to take 325mg . Order changed to 81mg . Pt already on Heparin drip. Watch for bleeding.  Clance Boll, NP [facility]

## 2014-04-17 NOTE — Consult Note (Signed)
CARDIOLOGY CONSULT NOTE       Patient ID: Lucas [name] MRN: [id number] DOB/AGE: [birth year] 68 y.o.  Admit date: 04/17/2014 Referring Physician:  Cruzita Lederer Primary Physician: Salena Saner., MD Primary Cardiologist:  Johnsie Cancel Reason for Consultation:  Angina  Principal Problem:   Unstable angina Active Problems:   Essential hypertension   PAD (peripheral artery disease)   PAF (paroxysmal atrial fibrillation)   CKD (chronic kidney disease), stage III   Chronic systolic CHF (congestive heart failure)   COPD exacerbation   HPI:   Mr. Spurgeon is a chronically ill male with multiple issues. These include COPD with chronic respiratory failure on home oxygen at night, chronic systolic HF with EF of 40 to 45%, remote renal cancer with past nephrectomy with CKD stage III, CAD with past CABG with his LIMA to the LAD patent and the SVG to OM3 but all other SVGs remain occluded. His other issues include anxiety, PAF and long standing tobacco abuse, PAD with past angioplasty in June of 2015 and chronic pain. Numerous hospitalizations. He is now on chronic anticoagulation but has had issues in the past with affording the Eliquis.   Admitted last night with angina  Multiple episodes of SSCP took multiple nitro with partial relief.  Also having increased wheezing helped with breathing Rx.  He gets SSCP multiple times / week.  Occasional rest pain.  He also has had increasing RLE pain and needs revascularization but not thought to be a great candidate for general anesthesia due to COPD Note patient did take both doses of Eliquis yesterday   ROS All other systems reviewed and negative except as noted above  Past Medical History  Diagnosis Date  . CAD (coronary artery disease)     a. s/p CABG x 5;  b. 04/2012 Cath: patent LIMA->LAD and VG->OM3, all other grafts occluded. c. Cath 12/2013: patent SVG-OM3 & LIMA-LAD, known occ other VG.  Marland Kitchen Hypertension   . Anxiety   . Adjustment disorder with  anxiety   . Hyperlipidemia     a. Unable to take statins.  Marland Kitchen BPH (benign prostatic hypertrophy)   . COPD (chronic obstructive pulmonary disease)     a. on home O2.  . Arthritis   . Ischemic cardiomyopathy     a. 06/2013 Echo: EF 30-35%, no reg wma's, Gr2 DD, triv AI, mod dil LA, nl RV fxn.  . Chronic systolic CHF (congestive heart failure)     a. 06/2013 Echo: EF 30-35%. b. 11/08/13 EF 40-45%, LVH, HK of the mid-distalanterseptal myocardium, trivial Ao regurg, calcified mitral annulus, RA mildly dilated  . Prostate cancer dx'd 2012  . Renal cancer dx'd 1997    lt nephrectomy  . CKD (chronic kidney disease), stage III   . Tobacco abuse     a. 91 yr hx as of 2015, transitioned to e-cig.  Marland Kitchen Syncope     a. 08/2013: the day following cath, no injury, had received multiple doses of Versed for anxiety - this was felt to be a contributing factor.  Marland Kitchen PAF (paroxysmal atrial fibrillation)     a. on Eliquis 5 mg bid  . Hypotension     a. H/o soft BP prohibiting ACEI/ARB use.  . Chronic pain   . PAD (peripheral artery disease)     a. 12/2013: PV angio s/p angioplasty to R external iliac artery stenosis, also has R SFA occlusion with reconstitution of above-knee popliteal artery, 2 vessel runoff via peroneal and posterior tib.b. LE doppler 02/2014 ABI right  0.52, left 0.95  . Pulmonary nodules     a. 12/2013:  small pulmonary nodules measuring up to 35mm in RM/LL, f/u recommended 6-12 months.    Family History  Problem Relation Age of Onset  . Diabetes Mother   . Heart disease Mother   . Hypertension Mother   . Heart attack Mother   . Coronary artery disease    . Heart disease Father   . Diabetes Father   . Heart attack Father   . Heart disease Brother   . Heart attack Brother   . COPD Sister     History   Social History  . Marital Status: Widowed    Spouse Name: N/A    Number of Children: N/A  . Years of Education: N/A   Occupational History  . disabled    Social History Main  Topics  . Smoking status: Former Smoker -- 0.30 packs/day for 54 years    Types: Cigarettes    Quit date: 02/04/2014  . Smokeless tobacco: Former Systems developer    Quit date: 04/19/2012     Comment: Using e-cig now  . Alcohol Use: No  . Drug Use: No  . Sexual Activity: Not Currently   Other Topics Concern  . Not on file   Social History Narrative    He lives in [place] by himself.  He is a widower.    He continues to smoke about 4 or 5  cigarettes a day but has a greater than 100 pack-year history having  previously smoked up to 3-4 packs a day over the span about 48 years.     He denies alcohol or drug use.  He is retired secondary to disability     since the 61s.     Past Surgical History  Procedure Laterality Date  . Coronary artery bypass graft      x 5  . Cardiac catheterization    . Nephrectomy      left nephrectomy for ca  . Posterior cervical laminectomy      x 8   limited ROM  and can't lie flat  . Heart stents      x 5  . Robot assisted laparoscopic radical prostatectomy      for prostate cancer  . Cystoscopy with litholapaxy  05/01/2012    Procedure: CYSTOSCOPY WITH LITHOLAPAXY;  Surgeon: Dutch Gray, MD;  Location: [facility];  Service: Urology;  Laterality: N/A;  . Prostate cancer    . Kidney surgery Left 1994     . aspirin EC  81 mg Oral Daily  . budesonide (PULMICORT) nebulizer solution  0.25 mg Nebulization BID  . doxazosin  8 mg Oral QHS  . doxycycline (VIBRAMYCIN) IV  100 mg Intravenous BID  . furosemide  20 mg Intravenous Q12H  . ipratropium-albuterol  3 mL Nebulization Q4H  . irbesartan  75 mg Oral Daily  . methylPREDNISolone (SOLU-MEDROL) injection  40 mg Intravenous Daily  . nitroGLYCERIN  0.4 mg Transdermal Daily  . potassium chloride SA  20 mEq Oral Daily  . sodium chloride  3 mL Intravenous Q12H   . heparin 1,000 Units/hr (04/17/14 0600)    Physical Exam: Blood pressure 162/71, pulse 97, temperature 97.8 F (36.6 C), temperature source Oral, resp.  rate 16, height 5\' 8"  (1.727 m), weight 222 lb 3.6 oz (100.8 kg), SpO2 97.00%.   Anxioius obese white male  Healthy:  appears stated age 68: normal Neck supple with no adenopathy JVP normal no bruits no thyromegaly  Lungs poor airway movement miild exp wheezing and good diaphragmatic motion Heart:  S1/S2 no murmur, no rub, gallop or click PMI normal Abdomen: benighn, BS positve, no tenderness, no AAA no bruit.  No HSM or HJR Pulses difficult to palpate below knees  No edema Neuro non-focal Skin warm and dry No muscular weakness   Labs:   Lab Results  Component Value Date   WBC 6.2 04/17/2014   HGB 13.2 04/17/2014   HCT 39.2 04/17/2014   MCV 89.1 04/17/2014   PLT 162 04/17/2014    Recent Labs Lab 04/17/14 0049  NA 136*  K 3.4*  CL 97  CO2 22  BUN 18  CREATININE 1.13  CALCIUM 8.9  GLUCOSE 116*   Lab Results  Component Value Date   CKTOTAL 92 11/23/2009   CKMB 1.6 11/23/2009   TROPONINI <0.30 04/17/2014    Lab Results  Component Value Date   CHOL 208* 07/01/2013   CHOL 194 04/05/2013   CHOL  Value: 177        ATP III CLASSIFICATION:  <200     mg/dL   Desirable  200-239  mg/dL   Borderline High  >=240    mg/dL   High        11/23/2009   Lab Results  Component Value Date   HDL 34* 07/01/2013   HDL 33* 04/05/2013   HDL 36* 11/23/2009   Lab Results  Component Value Date   LDLCALC 145* 07/01/2013   LDLCALC 135* 04/05/2013   LDLCALC  Value: 125        Total Cholesterol/HDL:CHD Risk Coronary Heart Disease Risk Table                     Men   Women  1/2 Average Risk   3.4   3.3  Average Risk       5.0   4.4  2 X Average Risk   9.6   7.1  3 X Average Risk  23.4   11.0        Use the calculated Patient Ratio above and the CHD Risk Table to determine the patient's CHD Risk.        ATP III CLASSIFICATION (LDL):  <100     mg/dL   Optimal  100-129  mg/dL   Near or Above                    Optimal  130-159  mg/dL   Borderline  160-189  mg/dL   High  >190     mg/dL   Very High*  11/23/2009   Lab Results  Component Value Date   TRIG 144 07/01/2013   TRIG 131 04/05/2013   TRIG 78 11/23/2009   Lab Results  Component Value Date   CHOLHDL 6.1 07/01/2013   CHOLHDL 5.9 04/05/2013   CHOLHDL 4.9 11/23/2009   No results found for this basename: LDLDIRECT      Radiology: Dg Pelvis 1-2 Views  04/17/2014   CLINICAL DATA:  Chronic left hip pain, with difficulty walking. Initial encounter.  EXAM: PELVIS - 1-2 VIEW  COMPARISON:  CT of the abdomen and pelvis performed 08/22/2013, and abdominal radiograph performed 04/27/2012  FINDINGS: There is no evidence of fracture or dislocation. Both femoral heads are seated normally within their respective acetabula. No significant degenerative change is appreciated. The sacroiliac joints are unremarkable in appearance.  The visualized bowel gas pattern is grossly unremarkable in appearance.  IMPRESSION: No evidence of  fracture or dislocation. No significant degenerative change seen. Joint spaces are preserved.   Electronically Signed   By: Garald Balding M.D.   On: 04/17/2014 03:19   Dg Chest Port 1 View  04/17/2014   CLINICAL DATA:  Initial encounter for severe chest pain.  EXAM: PORTABLE CHEST - 1 VIEW  COMPARISON:  Two-view chest 03/09/2014.  FINDINGS: Heart is mildly enlarged. Lung volumes are low. Mild bibasilar airspace disease likely reflects atelectasis. The patient is status post median sternotomy for CABG. Mild interstitial prominence is increased.  IMPRESSION: 1. Mild cardiac enlargement and increasing interstitial prominence suggesting mild edema. 2. Mild bibasilar airspace disease likely reflects atelectasis. Early infection is considered less likely.   Electronically Signed   By: Lawrence Santiago M.D.   On: 04/17/2014 01:28    EKG:  SR PAC nonspecific ST/ T wave changes    ASSESSMENT AND PLAN:  CAD:  Distant CABG  Last cath 2/15 with occluded vein grafts except for OM3 and LIMA.  Increasing angina  Needs repeat cath.  Took Eliquis  yesterday  Will plan on cath tomorrow afternoon.  Would consult anesthesia as he has a very hard time laying flat , with restless legs and anxiety that is not well rx with just versed and fentanyl  Last PV procedure done using MAC.  Continue nitrates and ASA  No beta blocker due to active wheezing   COPD:  Continue nebulizer and steroids  Followed by Carolynn Serve   With [facility] CXR no acute infiltrate  PVD:  S/P angioplasty right external iliac 6/15 with residual occlusion and collaterals of SFA  Despite improved inflow has limiting pain in right leg and will need fem pop if possible  Last cath done from left femoral artery    Signed: Jenkins Rouge 04/17/2014, 8:52 AM

## 2014-04-17 NOTE — Progress Notes (Signed)
Transitional Care Clinic Care Coordination Note:   Admit date:  04/17/14 Discharge date: TBD  Patient contact information:  [phone] (mobile)  This Case Manager reviewed patient's EMR and determined patient would benefit from chronic care management services through the [facility]. Patient has had 8 hospital admissions in the last six months and 2 ED visits in the last six months. Patient has a history of coronary artery disease s/p CABG, paroxysmal atrial fibrillation, COPD on home oxygen, peripheral artery disease. Patient presented to hospital on 04/17/14 with chest pain. This Case Manager met with patient to discuss the services and medical management that can be provided at the [facility].  Written consent obtained for Transitional Care Clinic follow-up upon discharge.  Patient aware she will receive a post-discharge transition of care phone call within 24-48 hours of discharge and will be evaluated for close follow-up for up to 30 days following discharge. Transitional Care Clinic appointment scheduled for 04/26/14 at 1000. Explained that [facility] services does not replace or interfere with any services that are arranged by inpatient case management or social work. For additional questions or referrals please contact [facility] at [phone].    Consent for Chronic Care Management for Transitional Care Clinic signed by patient.  Patient scheduled for Transitional Care appointment on 04/26/14 at 1000.  Clinic information and appointment time provided to patient. Clinic location and appointment time placed on AVS.   Assessment:       Home Environment: Patient lives alone in a private residence.       Level of functioning: Independent with daily activities       Home DME: cane, home oxygen (patient uses 2L at night. Supplier is [employer] nebulizer,        Home care services: none       Transportation: private car. Patient indicates he will  be able to drive to his Transitional Care Clinic appointment.        Medications: Patient indicates he is able to afford medications and uses a pharmacy in [place].        Identified Barriers: none        PCP: Patient indicates he would like to find a new PCP.  New PCP will need to be found for patient once patient no longer needs to be seen at the [facility].           Arranged services:        Services communicated to [employer], Inpatient Case Manager

## 2014-04-17 NOTE — Progress Notes (Signed)
ANTICOAGULATION CONSULT NOTE - Initial Consult  Pharmacy Consult for Heparin Indication: chest pain/ACS  Allergies  Allergen Reactions  . Ativan [Lorazepam] Other (See Comments)    Increases agitation, tolerates xanax  . Lisinopril Cough  . Morphine And Related Other (See Comments)    Hallucinations, too sedated when given with ativan  . Ibuprofen Hives, Nausea And Vomiting and Rash    Tolerates baby aspirin per patient.  There is no allergy to any dose of aspirin. Pt refuses to take 325mg  because it caused nose bleeds.    Patient Measurements: Height: 5\' 8"  (172.7 cm) Weight: 222 lb 3.6 oz (100.8 kg) IBW/kg (Calculated) : 68.4 Heparin Dosing Weight: 91kg  Vital Signs: Temp: 97.8 F (36.6 C) (10/07 0541) Temp Source: Oral (10/07 0541) BP: 149/66 mmHg (10/07 0900) Pulse Rate: 95 (10/07 0900)  Labs:  Recent Labs  04/17/14 0049 04/17/14 0700 04/17/14 0950  HGB 14.0 13.2  --   HCT 41.0 39.2  --   PLT 173 162  --   APTT  --   --  40*  HEPARINUNFRC  --   --  0.77*  CREATININE 1.13  --   --   TROPONINI  --  <0.30  --     Estimated Creatinine Clearance: 72 ml/min (by C-G formula based on Cr of 1.13).    Assessment: Patient with ACS/CP and history of CABG.  Multiple admission in previous 6 months. Prior to admission patient taking apixaban for PAF, last dose noted 10/6 (took am and pm doses).  Transfer to [facility] for cardiac cath 10/8  Today, 10/7:  Heparin level = 0.77 on heparin 1,000 units/hr  APTT = 40 sec  CBC: Hgb and pltc WNL  No bleeding evident  Goal of Therapy:  Heparin level 0.3-0.7 units/ml Monitor platelets by anticoagulation protocol: Yes  APTT 66-102 sec   Plan:   Difficult to dose heparin with apixaban's effects on anticoags.  Labs reveals heparin level > goal but aPTT below goal  Recommend increasing heparin to 1200 units/hr  Repeat heparin level and aptt in 6h  Patient just left [facility] to go to [facility] - will d/w pharmacist at [place], PharmD, BCPS.   Pager: [phone]  04/17/2014,11:07 AM

## 2014-04-18 ENCOUNTER — Encounter (HOSPITAL_COMMUNITY): Payer: Self-pay | Admitting: Anesthesiology

## 2014-04-18 ENCOUNTER — Encounter (HOSPITAL_COMMUNITY)
Admission: EM | Disposition: A | Discharge status: Home / Self Care | Payer: Self-pay | Source: Home / Self Care | Attending: Internal Medicine

## 2014-04-18 DIAGNOSIS — Z720 Tobacco use: Secondary | ICD-10-CM

## 2014-04-18 DIAGNOSIS — E669 Obesity, unspecified: Secondary | ICD-10-CM

## 2014-04-18 DIAGNOSIS — I5023 Acute on chronic systolic (congestive) heart failure: Secondary | ICD-10-CM

## 2014-04-18 LAB — BASIC METABOLIC PANEL
Anion gap: 13 (ref 5–15)
BUN: 31 mg/dL — ABNORMAL HIGH (ref 6–23)
CHLORIDE: 98 meq/L (ref 96–112)
CO2: 26 mEq/L (ref 19–32)
Calcium: 9.2 mg/dL (ref 8.4–10.5)
Creatinine, Ser: 1.32 mg/dL (ref 0.50–1.35)
GFR calc non Af Amer: 54 mL/min — ABNORMAL LOW (ref >90)
GFR, EST AFRICAN AMERICAN: 62 mL/min — AB (ref >90)
Glucose, Bld: 113 mg/dL — ABNORMAL HIGH (ref 70–99)
POTASSIUM: 4.1 meq/L (ref 3.7–5.3)
Sodium: 137 mEq/L (ref 137–147)

## 2014-04-18 LAB — GLUCOSE, CAPILLARY
Glucose-Capillary: 114 mg/dL — ABNORMAL HIGH (ref 70–99)
Glucose-Capillary: 116 mg/dL — ABNORMAL HIGH (ref 70–99)
Glucose-Capillary: 139 mg/dL — ABNORMAL HIGH (ref 70–99)
Glucose-Capillary: 177 mg/dL — ABNORMAL HIGH (ref 70–99)
Glucose-Capillary: 268 mg/dL — ABNORMAL HIGH (ref 70–99)

## 2014-04-18 LAB — CBC
HCT: 39.3 % (ref 39.0–52.0)
Hemoglobin: 12.8 g/dL — ABNORMAL LOW (ref 13.0–17.0)
MCH: 29.7 pg (ref 26.0–34.0)
MCHC: 32.6 g/dL (ref 30.0–36.0)
MCV: 91.2 fL (ref 78.0–100.0)
Platelets: 173 10*3/uL (ref 150–400)
RBC: 4.31 MIL/uL (ref 4.22–5.81)
RDW: 16 % — AB (ref 11.5–15.5)
WBC: 13.4 10*3/uL — ABNORMAL HIGH (ref 4.0–10.5)

## 2014-04-18 LAB — APTT
aPTT: 73 seconds — ABNORMAL HIGH (ref 24–37)
aPTT: 76 seconds — ABNORMAL HIGH (ref 24–37)

## 2014-04-18 LAB — TROPONIN I: Troponin I: <0.3 ng/mL (ref <0.30)

## 2014-04-18 LAB — HEPARIN LEVEL (UNFRACTIONATED)
Heparin Unfractionated: 0.78 IU/mL — ABNORMAL HIGH (ref 0.30–0.70)
Heparin Unfractionated: 0.86 IU/mL — ABNORMAL HIGH (ref 0.30–0.70)

## 2014-04-18 LAB — PROTIME-INR
INR: 1.17 (ref 0.00–1.49)
PROTHROMBIN TIME: 14.9 s (ref 11.6–15.2)

## 2014-04-18 LAB — MAGNESIUM: Magnesium: 2 mg/dL (ref 1.5–2.5)

## 2014-04-18 SURGERY — LEFT HEART CATHETERIZATION WITH CORONARY/GRAFT ANGIOGRAM
Anesthesia: Monitor Anesthesia Care

## 2014-04-18 MED ORDER — DOXYCYCLINE HYCLATE 100 MG PO TABS
100.0000 mg | ORAL_TABLET | Freq: Two times a day (BID) | ORAL | Status: DC
  Administered 2014-04-18 – 2014-04-20 (×4): 100 mg via ORAL
  Filled 2014-04-18 (×5): qty 1

## 2014-04-18 MED ORDER — NITROGLYCERIN 1 MG/10 ML FOR IR/CATH LAB
INTRA_ARTERIAL | Status: AC
  Filled 2014-04-18: qty 10

## 2014-04-18 MED ORDER — HYDROMORPHONE HCL 2 MG PO TABS
4.0000 mg | ORAL_TABLET | Freq: Four times a day (QID) | ORAL | Status: DC | PRN
  Administered 2014-04-18 – 2014-04-20 (×8): 4 mg via ORAL
  Filled 2014-04-18 (×8): qty 2

## 2014-04-18 MED ORDER — HYDROCODONE-ACETAMINOPHEN 10-325 MG PO TABS
1.0000 | ORAL_TABLET | ORAL | Status: DC | PRN
  Administered 2014-04-18 – 2014-04-20 (×8): 1 via ORAL
  Filled 2014-04-18 (×9): qty 1

## 2014-04-18 MED ORDER — FUROSEMIDE 10 MG/ML IJ SOLN
INTRAMUSCULAR | Status: AC
Start: 2014-04-18 — End: 2014-04-18
  Administered 2014-04-18: 20 mg via INTRAVENOUS
  Filled 2014-04-18: qty 4

## 2014-04-18 MED ORDER — PANTOPRAZOLE SODIUM 40 MG PO TBEC: 40.0000 mg | DELAYED_RELEASE_TABLET | Freq: Every day | ORAL | Status: DC

## 2014-04-18 NOTE — Progress Notes (Addendum)
[facility] PROGRESS NOTE  Lucas [name] AQT:[id number] DOB: Mar 24, 1946 DOA: 04/17/2014 PCP: Salena Saner., MD  Assessment/Plan: 1-unstable angina: no CP currently -will continue heparin IV -cardiology on board and plan for cath on 10/9 -continue ASA, ARB, NTG; no b-blocker given acute COPD exacerbation  2-COPD with exacerbation: continue solumedrol, nebulizers and antibiotics -breathing and air movement improving  3-cardiomyopathy: EF 40-45% -continue ARB and lasix -daily weight, strict intake and output  4-hx of PAF: has remained on sinus rhythm -Will resume apixaban once off heparin drip -rate controlled   5-chronic pain: will resume home regimen  6-BPH: continue cardura  7-obesity: low calorie diet, portion control and increase exercise discussed with patient   Code Status: full Family Communication: no family at bedside Disposition Plan: to be determine    Consultants:  Cardiology   Procedures:  See below for x-ray reports  Heart cath in am (10/9)  Antibiotics:  Doxycycline   HPI/Subjective: Afebrile. Complaining of severe unrelieved chronic pain on his neck, shoulders and back   Objective: Filed Vitals:   04/18/14 1300  BP: 152/62  Pulse: 70  Temp: 98.5 F (36.9 C)  Resp: 18    Intake/Output Summary (Last 24 hours) at 04/18/14 1756 Last data filed at 04/18/14 1500  Gross per 24 hour  Intake   1090 ml  Output   3550 ml  Net  -2460 ml   Filed Weights   04/17/14 0541 04/17/14 1100 04/18/14 0510  Weight: 100.8 kg (222 lb 3.6 oz) 100.562 kg (221 lb 11.2 oz) 102.513 kg (226 lb)    Exam:   General:  No CP; reports increase pain from chronic condition and not relieved with current pain management. No fever. Breathing/wheezing better.  Cardiovascular: S1 and S2, no rubs or gallops  Respiratory: positive exp wheezing; no frank crackles  Abdomen: soft, obese, no tender, positive BS  Musculoskeletal: trace edema, good  bilaterally muscle strength   Data Reviewed: Basic Metabolic Panel:  Recent Labs Lab 04/17/14 0049 04/18/14 0945  NA 136* 137  K 3.4* 4.1  CL 97 98  CO2 22 26  GLUCOSE 116* 113*  BUN 18 31*  CREATININE 1.13 1.32  CALCIUM 8.9 9.2  MG  --  2.0   CBC:  Recent Labs Lab 04/17/14 0049 04/17/14 0700 04/18/14 0433  WBC 7.7 6.2 13.4*  HGB 14.0 13.2 12.8*  HCT 41.0 39.2 39.3  MCV 88.6 89.1 91.2  PLT 173 162 173   Cardiac Enzymes:  Recent Labs Lab 04/17/14 0700 04/18/14 0223 04/18/14 0945  TROPONINI <0.30 <0.30 <0.30   BNP (last 3 results)  Recent Labs  03/08/14 1154 03/20/14 0906 04/17/14 0049  PROBNP 795.4* 185.0* 1368.0*   CBG:  Recent Labs Lab 04/17/14 0842 04/17/14 2046 04/18/14 0008 04/18/14 0503 04/18/14 0729  GLUCAP 166* 180* 177* 116* 114*    Recent Results (from the past 240 hour(s))  MRSA PCR SCREENING     Status: None   Collection Time    04/17/14  5:46 AM      Result Value Ref Range Status   MRSA by PCR NEGATIVE  NEGATIVE Final   Comment:            The GeneXpert MRSA Assay (FDA     approved for NASAL specimens     only), is one component of a     comprehensive MRSA colonization     surveillance program. It is not     intended to diagnose MRSA     infection  nor to guide or     monitor treatment for     MRSA infections.     Studies: Dg Pelvis 1-2 Views  04/17/2014   CLINICAL DATA:  Chronic left hip pain, with difficulty walking. Initial encounter.  EXAM: PELVIS - 1-2 VIEW  COMPARISON:  CT of the abdomen and pelvis performed 08/22/2013, and abdominal radiograph performed 04/27/2012  FINDINGS: There is no evidence of fracture or dislocation. Both femoral heads are seated normally within their respective acetabula. No significant degenerative change is appreciated. The sacroiliac joints are unremarkable in appearance.  The visualized bowel gas pattern is grossly unremarkable in appearance.  IMPRESSION: No evidence of fracture or  dislocation. No significant degenerative change seen. Joint spaces are preserved.   Electronically Signed   By: Garald Balding M.D.   On: 04/17/2014 03:19   Dg Chest Port 1 View  04/17/2014   CLINICAL DATA:  Initial encounter for severe chest pain.  EXAM: PORTABLE CHEST - 1 VIEW  COMPARISON:  Two-view chest 03/09/2014.  FINDINGS: Heart is mildly enlarged. Lung volumes are low. Mild bibasilar airspace disease likely reflects atelectasis. The patient is status post median sternotomy for CABG. Mild interstitial prominence is increased.  IMPRESSION: 1. Mild cardiac enlargement and increasing interstitial prominence suggesting mild edema. 2. Mild bibasilar airspace disease likely reflects atelectasis. Early infection is considered less likely.   Electronically Signed   By: Lawrence Santiago M.D.   On: 04/17/2014 01:28    Scheduled Meds: . aspirin EC  81 mg Oral Daily  . budesonide (PULMICORT) nebulizer solution  0.25 mg Nebulization BID  . doxazosin  8 mg Oral QHS  . doxycycline  100 mg Oral Q12H  . furosemide  20 mg Intravenous Q12H  . ipratropium-albuterol  3 mL Nebulization QID  . irbesartan  75 mg Oral Daily  . methylPREDNISolone (SOLU-MEDROL) injection  40 mg Intravenous Daily  . nitroGLYCERIN  0.4 mg Transdermal Daily  . pantoprazole  40 mg Oral Q1200  . potassium chloride SA  20 mEq Oral Daily  . sodium chloride  3 mL Intravenous Q12H  . sodium chloride  3 mL Intravenous Q12H   Continuous Infusions: . heparin 1,350 Units/hr (04/18/14 1322)    Principal Problem:   Unstable angina Active Problems:   Essential hypertension   PAD (peripheral artery disease)   PAF (paroxysmal atrial fibrillation)   CKD (chronic kidney disease), stage III   Chronic systolic CHF (congestive heart failure)   COPD exacerbation    Time spent: < 30 minutes    Barton Dubois  [facility] Pager [phone]. If 7PM-7AM, please contact night-coverage at [email or web address], password [facility] 04/18/2014, 5:56 PM   LOS: 1 day

## 2014-04-18 NOTE — Progress Notes (Signed)
Patient Name: Lucas [name] Date of Encounter: 04/18/2014  Principal Problem:   Unstable angina Active Problems:   Essential hypertension   PAD (peripheral artery disease)   PAF (paroxysmal atrial fibrillation)   CKD (chronic kidney disease), stage III   Chronic systolic CHF (congestive heart failure)   COPD exacerbation   Length of Stay: 1  SUBJECTIVE  Anxious, trembling, complains of cervical pain. Mild chest pain, dyspnea.  CURRENT MEDS . aspirin EC  81 mg Oral Daily  . budesonide (PULMICORT) nebulizer solution  0.25 mg Nebulization BID  . doxazosin  8 mg Oral QHS  . doxycycline (VIBRAMYCIN) IV  100 mg Intravenous BID  . furosemide  20 mg Intravenous Q12H  . ipratropium-albuterol  3 mL Nebulization QID  . irbesartan  75 mg Oral Daily  . methylPREDNISolone (SOLU-MEDROL) injection  40 mg Intravenous Daily  . nitroGLYCERIN  0.4 mg Transdermal Daily  . pantoprazole  40 mg Oral Q1200  . potassium chloride SA  20 mEq Oral Daily  . sodium chloride  3 mL Intravenous Q12H  . sodium chloride  3 mL Intravenous Q12H    OBJECTIVE   Intake/Output Summary (Last 24 hours) at 04/18/14 0945 Last data filed at 04/18/14 0900  Gross per 24 hour  Intake    340 ml  Output   2780 ml  Net  -2440 ml   Filed Weights   04/17/14 0541 04/17/14 1100 04/18/14 0510  Weight: 100.8 kg (222 lb 3.6 oz) 100.562 kg (221 lb 11.2 oz) 102.513 kg (226 lb)    PHYSICAL EXAM Filed Vitals:   04/18/14 0019 04/18/14 0510 04/18/14 0719 04/18/14 0758  BP: 146/74 134/69  139/64  Pulse: 65 68  63  Temp: 97.5 F (36.4 C) 98.2 F (36.8 C)  97.9 F (36.6 C)  TempSrc: Oral Oral  Oral  Resp: 20 18  16   Height:      Weight:  102.513 kg (226 lb)    SpO2: 93% 94% 94% 93%   General: Alert, oriented x3, tremulous Head: no evidence of trauma, PERRL, EOMI, no exophtalmos or lid lag, no myxedema, no xanthelasma; normal ears, nose and oropharynx Neck: normal jugular venous pulsations and no hepatojugular  reflux; brisk carotid pulses without delay and no carotid bruits Chest: emphysematous chest with diminished breath sounds throughout, but no wheezing, no signs of consolidation by percussion or palpation, symmetrical and full respiratory excursions Cardiovascular: normal position and quality of the apical impulse, regular rhythm, normal first and second heart sounds, no rubs or gallops, no murmur Abdomen: no tenderness or distention, no masses by palpation, no abnormal pulsatility or arterial bruits, normal bowel sounds, no hepatosplenomegaly Extremities: no clubbing, cyanosis or edema; 2+ radial, ulnar and brachial pulses bilaterally; 2+ right femoral, posterior tibial and dorsalis pedis pulses; 2+ left femoral, posterior tibial and dorsalis pedis pulses; no subclavian or femoral bruits Neurological: grossly nonfocal  LABS  CBC  Recent Labs  04/17/14 0700 04/18/14 0433  WBC 6.2 13.4*  HGB 13.2 12.8*  HCT 39.2 39.3  MCV 89.1 91.2  PLT 162 664   Basic Metabolic Panel  Recent Labs  04/17/14 0049  NA 136*  K 3.4*  CL 97  CO2 22  GLUCOSE 116*  BUN 18  CREATININE 1.13  CALCIUM 8.9   Liver Function Tests No results found for this basename: AST, ALT, ALKPHOS, BILITOT, PROT, ALBUMIN,  in the last 72 hours No results found for this basename: LIPASE, AMYLASE,  in the last 72 hours Cardiac Enzymes  Recent Labs  04/17/14 0700 04/18/14 0223  TROPONINI <0.30 <0.30    Radiology Studies Imaging results have been reviewed and Dg Pelvis 1-2 Views  04/17/2014   CLINICAL DATA:  Chronic left hip pain, with difficulty walking. Initial encounter.  EXAM: PELVIS - 1-2 VIEW  COMPARISON:  CT of the abdomen and pelvis performed 08/22/2013, and abdominal radiograph performed 04/27/2012  FINDINGS: There is no evidence of fracture or dislocation. Both femoral heads are seated normally within their respective acetabula. No significant degenerative change is appreciated. The sacroiliac joints are  unremarkable in appearance.  The visualized bowel gas pattern is grossly unremarkable in appearance.  IMPRESSION: No evidence of fracture or dislocation. No significant degenerative change seen. Joint spaces are preserved.   Electronically Signed   By: Garald Balding M.D.   On: 04/17/2014 03:19   Dg Chest Port 1 View  04/17/2014   CLINICAL DATA:  Initial encounter for severe chest pain.  EXAM: PORTABLE CHEST - 1 VIEW  COMPARISON:  Two-view chest 03/09/2014.  FINDINGS: Heart is mildly enlarged. Lung volumes are low. Mild bibasilar airspace disease likely reflects atelectasis. The patient is status post median sternotomy for CABG. Mild interstitial prominence is increased.  IMPRESSION: 1. Mild cardiac enlargement and increasing interstitial prominence suggesting mild edema. 2. Mild bibasilar airspace disease likely reflects atelectasis. Early infection is considered less likely.   Electronically Signed   By: Lawrence Santiago M.D.   On: 04/17/2014 01:28    TELE NSR  ECG SR with PAC, old anterior MI, lateral ST depression worsened from prior tracing  ASSESSMENT AND PLAN  COPD with chronic respiratory failure on home oxygen at night chronic systolic HF with EF of 40 to 45% remote renal cancer with past nephrectomy with CKD stage III,  CAD with past CABGx5: LIMA to the LAD and SVG to OM3 patent , but all other SVGs occluded. Anxiety Chronic pain, opiate dependency PAF long standing tobacco abuse, PAD with past angioplasty in June of 2015 under MAC  Symptoms are consistent with unstable angina and new ECG changes are present, but troponin negative. Needs cardiac cath, but unable to tolerate this without general anesthesia/MAC. Not sure we can get necessary Anesthesia support today, may need to reschedule for tomorrow. Has signs of opiate withdrawal - he is chronically on high dose Dilaudid at home -will increase to his home dose.   Sanda Klein, MD, [facility] [phone]  office [phone] pager 04/18/2014 9:45 AM

## 2014-04-18 NOTE — Progress Notes (Signed)
ANTICOAGULATION CONSULT NOTE - Follow Up Consult   Vital Signs: Temp: 98.5 F (36.9 C) (10/08 1300) Temp Source: Oral (10/08 1300) BP: 152/62 mmHg (10/08 1300) Pulse Rate: 70 (10/08 1300)  Labs:  Recent Labs  04/17/14 0049 04/17/14 0700  04/17/14 2000 04/18/14 0223 04/18/14 0433 04/18/14 0945 04/18/14 1342  HGB 14.0 13.2  --   --   --  12.8*  --   --   HCT 41.0 39.2  --   --   --  39.3  --   --   PLT 173 162  --   --   --  173  --   --   APTT  --   --   < > 61*  --  73*  --  76*  LABPROT  --   --   --   --   --  14.9  --   --   INR  --   --   --   --   --  1.17  --   --   HEPARINUNFRC  --   --   < > 0.72*  --  0.78*  --  0.86*  CREATININE 1.13  --   --   --   --   --  1.32  --   TROPONINI  --  <0.30  --   --  <0.30  --  <0.30  --   < > = values in this interval not displayed.  Estimated Creatinine Clearance: 62.1 ml/min (by C-G formula based on Cr of 1.32).   HL = 0.86 (goal 0.3 - 0.7 units/mL) APTT = 76 (goal 66 - 102 sec) Heparin dosing weight = 91 kg   Assessment: 67 with PAF to continue on IV heparin while Eliquis is on hold.   We rechecked the heparin level and aPTT ~ 8hrs post AM labs to confirm remains in therapeutic range x 2 consecutive labs before changing to once daily monitoring of aPTT/ heparin level.    HL = 0.86 (goal 0.3 - 0.7 units/mL) APTT = 76 (goal 66 - 102 sec) Current heparin rate = 1350 units/hr  The aPTT remains within the therapeutic range.  Heparin level is still supra-therapeutic as it can be falsely elevated by eliquis dose. It has been < 48 hrs since his last eliquis dose. We will continue to monitor aPTT until heparin level correlates with aPTT findings as effect of eliquis diminishes.     Plan: Continue IV heparin gtt 1350 units/hr Daily aPTT, heparin level and CBC.    Nicole Cella, RPh Clinical Pharmacist Pager: [phone] 04/18/2014, 3:14 PM

## 2014-04-18 NOTE — Progress Notes (Signed)
ANTICOAGULATION CONSULT NOTE - Follow up  Pharmacy Consult for Heparin Indication: chest pain/ACS  Allergies  Allergen Reactions  . Ativan [Lorazepam] Other (See Comments)    Increases agitation, tolerates xanax  . Lisinopril Cough  . Morphine And Related Other (See Comments)    Hallucinations, too sedated when given with ativan  . Ibuprofen Hives, Nausea And Vomiting and Rash    Tolerates baby aspirin per patient.  There is no allergy to any dose of aspirin. Pt refuses to take 325mg  because it caused nose bleeds.    Patient Measurements: Height: 5\' 8"  (172.7 cm) Weight: 226 lb (102.513 kg) (scale a) IBW/kg (Calculated) : 68.4 Heparin Dosing Weight: 91kg  Vital Signs: Temp: 97.9 F (36.6 C) (10/08 0758) Temp Source: Oral (10/08 0758) BP: 139/64 mmHg (10/08 0758) Pulse Rate: 63 (10/08 0758)  Labs:  Recent Labs  04/17/14 0049 04/17/14 0700 04/17/14 0950 04/17/14 2000 04/18/14 0223 04/18/14 0433 04/18/14 0945  HGB 14.0 13.2  --   --   --  12.8*  --   HCT 41.0 39.2  --   --   --  39.3  --   PLT 173 162  --   --   --  173  --   APTT  --   --  40* 61*  --  73*  --   LABPROT  --   --   --   --   --  14.9  --   INR  --   --   --   --   --  1.17  --   HEPARINUNFRC  --   --  0.77* 0.72*  --  0.78*  --   CREATININE 1.13  --   --   --   --   --  1.32  TROPONINI  --  <0.30  --   --  <0.30  --  <0.30    Estimated Creatinine Clearance: 62.1 ml/min (by C-G formula based on Cr of 1.32).    Assessment: 68 y.o male admitted 10/7 with ACS/CP and history of CABG and PAF.  Multiple admission in previous 6 months. Prior to admission patient taking apixaban (Eliquis) for PAF, last PTA dose noted 10/6 (took am and pm doses).  Eliquis is on hold. Currently using both heparin levels and aPTTs to assess heparin dosing because Eliquis could falsely elevate heparin levels. Heparin level 0.78 today, remains slightly supra-therapeutic but aPTT of 73 secs is therapeutic today on heparin rate  1350 units/h. No bleeding reported.    Heparin level = 0.78 on heparin 1350 units/hr  APTT = 73 sec  CBC: Hgb 12.8 and pltc WNL  No bleeding evident  Goal of Therapy:  APTT 66-102 sec Heparin level 0.3-0.7 units/ml Monitor platelets by anticoagulation protocol: Yes      Plan:   Difficult to dose heparin with apixaban's effects on anticoags.  Labs reveals heparin level > goal but aPTT is therapeutic.   Will continue heparin at current rate of 1350 units/hr  Daily heparin level, aPTT, and CBC  Nicole Cella, RPh Clinical Pharmacist Pager: [phone]  04/18/2014,11:49 AM

## 2014-04-18 NOTE — Progress Notes (Signed)
Troponins originally ordered every 6 hours x3, but last 2 troponins not retrieved. Remaining 2 troponins ordered per previous order. Will continue to monitor.

## 2014-04-18 NOTE — Progress Notes (Signed)
Nutrition Brief Note  Patient identified on the Malnutrition Screening Tool (MST) Report. Score was filed inaccurately. Pt denies any weight loss and reports normal appetite/eating well PTA.   Wt Readings from Last 15 Encounters:  04/18/14 226 lb (102.513 kg)  04/18/14 226 lb (102.513 kg)  04/09/14 220 lb (99.791 kg)  04/01/14 219 lb 6.4 oz (99.519 kg)  03/21/14 217 lb (98.431 kg)  03/11/14 218 lb 14.4 oz (99.292 kg)  03/08/14 218 lb (98.884 kg)  03/07/14 220 lb (99.791 kg)  02/22/14 215 lb 9.6 oz (97.796 kg)  02/20/14 210 lb (95.255 kg)  02/05/14 217 lb 9.6 oz (98.703 kg)  01/29/14 212 lb (96.163 kg)  01/09/14 222 lb 12.8 oz (101.061 kg)  12/31/13 218 lb 6.4 oz (99.066 kg)  12/25/13 211 lb 10.3 oz (96 kg)    Body mass index is 34.37 kg/(m^2). Patient meets criteria for Obesity based on current BMI.   Current diet order is NPO. Yesterday patient was consuming approximately 100% of meals. Pt reports following a cardiac diet PTA and he denies any questions or concerns at this time. Encouraged pt to request return visit of RD via RN if any questions arise. Labs and medications reviewed.   No nutrition interventions warranted at this time. If nutrition issues arise, please consult RD.   Pryor Ochoa RD, LDN Inpatient Clinical Dietitian Pager: [phone] After Hours Pager: [phone]

## 2014-04-18 NOTE — Anesthesia Preprocedure Evaluation (Addendum)
Anesthesia Evaluation  Patient identified by MRN, date of birth, ID band Patient awake    Reviewed: Allergy & Precautions, H&P , NPO status , Patient's Chart, lab work & pertinent test results  Airway Mallampati: I TM Distance: >3 FB Neck ROM: Full    Dental  (+) Upper Dentures, Lower Dentures, Dental Advisory Given   Pulmonary neg sleep apnea, COPD COPD inhaler and oxygen dependent, Current Smoker, former smoker,  breath sounds clear to auscultation        Cardiovascular hypertension, Pt. on medications + CAD, + Cardiac Stents, + CABG and +CHF Rhythm:Regular Rate:Normal  11/08/13 ECHO: EF 40-45%, LVH, HK of the mid-distalanterseptal myocardium, trivial Ao regurg, calcified mitral annulus, RA mildly dilated   Neuro/Psych Anxiety    GI/Hepatic   Endo/Other    Renal/GU Renal InsufficiencyRenal disease     Musculoskeletal   Abdominal   Peds  Hematology  (+) Blood dyscrasia (eliquis), ,   Anesthesia Other Findings   Reproductive/Obstetrics                         Anesthesia Physical Anesthesia Plan  ASA: III  Anesthesia Plan: General   Post-op Pain Management:    Induction: Intravenous  Airway Management Planned: LMA  Additional Equipment:   Intra-op Plan:   Post-operative Plan: Extubation in OR  Informed Consent: I have reviewed the patients History and Physical, chart, labs and discussed the procedure including the risks, benefits and alternatives for the proposed anesthesia with the patient or authorized representative who has indicated his/her understanding and acceptance.   Dental advisory given  Plan Discussed with: CRNA, Anesthesiologist and Surgeon  Anesthesia Plan Comments:         Anesthesia Quick Evaluation

## 2014-04-18 NOTE — CV Procedure (Signed)
   CARDIAC CATHETERIZATION NOTE  NAME:  Lucas [name]   MRN: [id number] DOB:  11/27/1945   ADMIT DATE: 04/17/2014 Procedure Date: 04/18/2014  INTERVENTIONAL CARDIOLOGIST: Leonie Man, M.D., MS PRIMARY CARE PROVIDER: Salena Saner., MD PRIMARY CARDIOLOGIST:  PATIENT:  Lucas [name] is a 68 y.o. male  PRE-OPERATIVE DIAGNOSIS:    UNSTABLE ANGINA  [name] was brought to the PACU Holding area for planned Cardiac Cat procedure.  Due to his high narcotic intolerance & severe chronic back pain, he has required Anesthesia for safe sedation.  Upon pre-procedure evaluation by Anesthesiology, he indicated that he had a cup of coffee WITH CREAMER @ ~3-3:15 PM today.  Unfortunately, the CREAMER poses a major concern for sedation beyond conscious sedation -- i.e. If he looses control of his airway b/c concern for aspiration.  We would have to wait 6 hrs in order to be safe.  We therefore have decided to post-pone the procedure until tomorrow.  This will be scheduled with anesthesia & he will need to be NPO after MN.  The primary Cardiology Rounding MD is aware.   Leonie Man, M.D., M.S. Interventional Cardiologist   Pager # [phone]

## 2014-04-19 ENCOUNTER — Encounter (HOSPITAL_COMMUNITY): Payer: PRIVATE HEALTH INSURANCE | Admitting: Certified Registered Nurse Anesthetist

## 2014-04-19 ENCOUNTER — Encounter (HOSPITAL_COMMUNITY)
Admission: EM | Disposition: A | Discharge status: Home / Self Care | Payer: Self-pay | Source: Home / Self Care | Attending: Internal Medicine

## 2014-04-19 ENCOUNTER — Inpatient Hospital Stay (HOSPITAL_COMMUNITY): Payer: PRIVATE HEALTH INSURANCE | Admitting: Certified Registered Nurse Anesthetist

## 2014-04-19 DIAGNOSIS — I25110 Atherosclerotic heart disease of native coronary artery with unstable angina pectoris: Principal | ICD-10-CM

## 2014-04-19 DIAGNOSIS — G8929 Other chronic pain: Secondary | ICD-10-CM

## 2014-04-19 DIAGNOSIS — I10 Essential (primary) hypertension: Secondary | ICD-10-CM

## 2014-04-19 DIAGNOSIS — I480 Paroxysmal atrial fibrillation: Secondary | ICD-10-CM

## 2014-04-19 DIAGNOSIS — G4733 Obstructive sleep apnea (adult) (pediatric): Secondary | ICD-10-CM

## 2014-04-19 HISTORY — PX: LEFT HEART CATHETERIZATION WITH CORONARY/GRAFT ANGIOGRAM: SHX5450

## 2014-04-19 LAB — GLUCOSE, CAPILLARY
GLUCOSE-CAPILLARY: 91 mg/dL (ref 70–99)
Glucose-Capillary: 158 mg/dL — ABNORMAL HIGH (ref 70–99)
Glucose-Capillary: 95 mg/dL (ref 70–99)
Glucose-Capillary: 100 mg/dL — ABNORMAL HIGH (ref 70–99)

## 2014-04-19 LAB — CBC
HEMATOCRIT: 38.4 % — AB (ref 39.0–52.0)
HEMOGLOBIN: 12.6 g/dL — AB (ref 13.0–17.0)
MCH: 29.9 pg (ref 26.0–34.0)
MCHC: 32.8 g/dL (ref 30.0–36.0)
MCV: 91.2 fL (ref 78.0–100.0)
Platelets: 159 10*3/uL (ref 150–400)
RBC: 4.21 MIL/uL — ABNORMAL LOW (ref 4.22–5.81)
RDW: 16.1 % — ABNORMAL HIGH (ref 11.5–15.5)
WBC: 11.5 10*3/uL — ABNORMAL HIGH (ref 4.0–10.5)

## 2014-04-19 LAB — HEPARIN LEVEL (UNFRACTIONATED): Heparin Unfractionated: 0.58 IU/mL (ref 0.30–0.70)

## 2014-04-19 SURGERY — ECHOCARDIOGRAM, TRANSESOPHAGEAL
Anesthesia: General

## 2014-04-19 SURGERY — LEFT HEART CATHETERIZATION WITH CORONARY/GRAFT ANGIOGRAM
Anesthesia: General

## 2014-04-19 MED ORDER — HEPARIN (PORCINE) IN NACL 100-0.45 UNIT/ML-% IJ SOLN: 1350.0000 [IU]/h | INTRAMUSCULAR | Status: DC

## 2014-04-19 MED ORDER — MIDAZOLAM HCL 5 MG/5ML IJ SOLN
INTRAMUSCULAR | Status: DC | PRN
  Administered 2014-04-19 (×2): 2 mg via INTRAVENOUS

## 2014-04-19 MED ORDER — VERAPAMIL HCL 2.5 MG/ML IV SOLN
INTRAVENOUS | Status: AC
  Filled 2014-04-19: qty 2

## 2014-04-19 MED ORDER — FENTANYL CITRATE 0.05 MG/ML IJ SOLN: 25.0000 ug | INTRAMUSCULAR | Status: DC | PRN

## 2014-04-19 MED ORDER — LIDOCAINE HCL (CARDIAC) 20 MG/ML IV SOLN
INTRAVENOUS | Status: DC | PRN
  Administered 2014-04-19: 40 mg via INTRAVENOUS

## 2014-04-19 MED ORDER — LACTATED RINGERS IV SOLN
INTRAVENOUS | Status: DC | PRN
  Administered 2014-04-19: 14:00:00 via INTRAVENOUS

## 2014-04-19 MED ORDER — LIDOCAINE HCL (PF) 1 % IJ SOLN
INTRAMUSCULAR | Status: AC
  Filled 2014-04-19: qty 30

## 2014-04-19 MED ORDER — SODIUM CHLORIDE 0.9 % IV SOLN
1.0000 mL/kg/h | INTRAVENOUS | Status: AC
  Administered 2014-04-19: 1 mL/kg/h via INTRAVENOUS

## 2014-04-19 MED ORDER — HEPARIN (PORCINE) IN NACL 2-0.9 UNIT/ML-% IJ SOLN
INTRAMUSCULAR | Status: AC
  Filled 2014-04-19: qty 1000

## 2014-04-19 MED ORDER — ONDANSETRON HCL 4 MG/2ML IJ SOLN
INTRAMUSCULAR | Status: DC | PRN
  Administered 2014-04-19: 4 mg via INTRAVENOUS

## 2014-04-19 MED ORDER — PHENYLEPHRINE HCL 10 MG/ML IJ SOLN
10.0000 mg | INTRAVENOUS | Status: DC | PRN
  Administered 2014-04-19: 20 ug/min via INTRAVENOUS

## 2014-04-19 MED ORDER — ACETAMINOPHEN 325 MG PO TABS: 650.0000 mg | ORAL_TABLET | ORAL | Status: DC | PRN

## 2014-04-19 MED ORDER — PREDNISONE 50 MG PO TABS
50.0000 mg | ORAL_TABLET | Freq: Every day | ORAL | Status: DC
  Filled 2014-04-19 (×2): qty 1

## 2014-04-19 MED ORDER — PROPOFOL 10 MG/ML IV BOLUS
INTRAVENOUS | Status: DC | PRN
  Administered 2014-04-19: 150 mg via INTRAVENOUS

## 2014-04-19 MED ORDER — DEXAMETHASONE SODIUM PHOSPHATE 4 MG/ML IJ SOLN
INTRAMUSCULAR | Status: DC | PRN
  Administered 2014-04-19: 8 mg via INTRAVENOUS

## 2014-04-19 MED ORDER — FENTANYL CITRATE 0.05 MG/ML IJ SOLN
INTRAMUSCULAR | Status: DC | PRN
  Administered 2014-04-19: 100 ug via INTRAVENOUS

## 2014-04-19 MED ORDER — ONDANSETRON HCL 4 MG/2ML IJ SOLN: 4.0000 mg | Freq: Four times a day (QID) | INTRAMUSCULAR | Status: DC | PRN

## 2014-04-19 MED ORDER — ONDANSETRON HCL 4 MG/2ML IJ SOLN
4.0000 mg | Freq: Once | INTRAMUSCULAR | Status: AC | PRN
Start: 2014-04-19 — End: 2014-04-19

## 2014-04-19 MED ORDER — HEPARIN (PORCINE) IN NACL 100-0.45 UNIT/ML-% IJ SOLN
1350.0000 [IU]/h | INTRAMUSCULAR | Status: DC
  Administered 2014-04-19 – 2014-04-20 (×2): 1350 [IU]/h via INTRAVENOUS
  Filled 2014-04-19: qty 250

## 2014-04-19 MED ORDER — HEPARIN SODIUM (PORCINE) 1000 UNIT/ML IJ SOLN
INTRAMUSCULAR | Status: DC | PRN
  Administered 2014-04-19: 5000 [IU] via INTRAVENOUS

## 2014-04-19 NOTE — Anesthesia Postprocedure Evaluation (Signed)
  Anesthesia Post-op Note  Patient: Lucas [name]  Procedure(s) Performed: Procedure(s): LEFT HEART CATHETERIZATION WITH CORONARY/GRAFT ANGIOGRAM (N/A)  Patient Location: PACU  Anesthesia Type: General   Level of Consciousness: awake, alert  and oriented  Airway and Oxygen Therapy: Patient Spontanous Breathing  Post-op Pain: none  Post-op Assessment: Post-op Vital signs reviewed  Post-op Vital Signs: Reviewed  Last Vitals:  Filed Vitals:   04/19/14 1607  BP: 152/75  Pulse: 81  Temp:   Resp: 15    Complications: No apparent anesthesia complications

## 2014-04-19 NOTE — Interval H&P Note (Signed)
Cath Lab Visit (complete for each Cath Lab visit)  Clinical Evaluation Leading to the Procedure:   ACS: Yes.    Non-ACS:    Anginal Classification: CCS IV  Anti-ischemic medical therapy: Minimal Therapy (1 class of medications)  Non-Invasive Test Results: No non-invasive testing performed  Prior CABG: Previous CABG      History and Physical Interval Note:  04/19/2014 2:26 PM  Lucas [name]  has presented today for surgery, with the diagnosis of cp  The various methods of treatment have been discussed with the patient and family. After consideration of risks, benefits and other options for treatment, the patient has consented to  Procedure(s): LEFT HEART CATHETERIZATION WITH CORONARY/GRAFT ANGIOGRAM (N/A) as a surgical intervention .  The patient's history has been reviewed, patient examined, no change in status, stable for surgery.  I have reviewed the patient's chart and labs.  Questions were answered to the patient's satisfaction.     VARANASI,JAYADEEP S.

## 2014-04-19 NOTE — H&P (View-Only) (Signed)
Patient Name: Lucas [name] Date of Encounter: 04/19/2014  Principal Problem:   Unstable angina Active Problems:   Essential hypertension   PAD (peripheral artery disease)   PAF (paroxysmal atrial fibrillation)   CKD (chronic kidney disease), stage III   Chronic systolic CHF (congestive heart failure)   COPD exacerbation   Length of Stay: 2  SUBJECTIVE  No further angina although he continues to complain of neck and back pain. Yesterday's cardiac catheterization had to be canceled since he drank coffee with cream and sugar. This morning I found him at the nurse's desk asking for breakfast, although he knows that he is scheduled for the cardiac catheterization with general anesthesia today. I insisted that he limit his intake to small sips of water. The procedure is scheduled in just a few hours.  He has been off oral anticoagulants for more than 48 hours now. Yesterday afternoon his electro-cardiogram showed a heart rate of 99 beats per minute, sinus rhythm with PACs, persistent lateral ST-T wave changes  CURRENT MEDS . aspirin EC  81 mg Oral Daily  . budesonide (PULMICORT) nebulizer solution  0.25 mg Nebulization BID  . doxazosin  8 mg Oral QHS  . doxycycline  100 mg Oral Q12H  . furosemide  20 mg Intravenous Q12H  . ipratropium-albuterol  3 mL Nebulization QID  . irbesartan  75 mg Oral Daily  . methylPREDNISolone (SOLU-MEDROL) injection  40 mg Intravenous Daily  . nitroGLYCERIN  0.4 mg Transdermal Daily  . pantoprazole  40 mg Oral Q1200  . potassium chloride SA  20 mEq Oral Daily  . sodium chloride  3 mL Intravenous Q12H  . sodium chloride  3 mL Intravenous Q12H    OBJECTIVE   Intake/Output Summary (Last 24 hours) at 04/19/14 0908 Last data filed at 04/19/14 0751  Gross per 24 hour  Intake   1350 ml  Output   2620 ml  Net  -1270 ml   Filed Weights   04/17/14 1100 04/18/14 0510 04/19/14 0437  Weight: 100.562 kg (221 lb 11.2 oz) 102.513 kg (226 lb) 102.15 kg (225  lb 3.2 oz)    PHYSICAL EXAM Filed Vitals:   04/18/14 1800 04/18/14 2016 04/19/14 0437 04/19/14 0845  BP: 144/76 113/96 154/85   Pulse: 69 99 66   Temp: 98 F (36.7 C) 97.9 F (36.6 C) 97.9 F (36.6 C)   TempSrc: Oral Oral Oral   Resp: 22 18 18    Height:      Weight:   102.15 kg (225 lb 3.2 oz)   SpO2: 90% 90% 98% 97%   General: Alert, oriented x3, no distress Head: no evidence of trauma, PERRL, EOMI, no exophtalmos or lid lag, no myxedema, no xanthelasma; normal ears, nose and oropharynx Neck: normal jugular venous pulsations and no hepatojugular reflux; brisk carotid pulses without delay and no carotid bruits Chest: Emphysematous chest, diffusely decreased breath sounds, minimal scattered wheezing, no signs of consolidation by percussion or palpation, normal fremitus, symmetrical and full respiratory excursions Cardiovascular: normal position and quality of the apical impulse, regular rhythm, normal first and second heart sounds, no rubs or gallops, no murmur Abdomen: no tenderness or distention, no masses by palpation, no abnormal pulsatility or arterial bruits, normal bowel sounds, no hepatosplenomegaly Extremities: no clubbing, cyanosis or edema; 2+ radial, ulnar and brachial pulses bilaterally; 2+ right femoral, posterior tibial and dorsalis pedis pulses; 2+ left femoral, posterior tibial and dorsalis pedis pulses; no subclavian or femoral bruits Neurological: grossly nonfocal  LABS  CBC  Recent Labs  04/18/14 0433 04/19/14 0424  WBC 13.4* 11.5*  HGB 12.8* 12.6*  HCT 39.3 38.4*  MCV 91.2 91.2  PLT 173 250   Basic Metabolic Panel  Recent Labs  04/17/14 0049 04/18/14 0945  NA 136* 137  K 3.4* 4.1  CL 97 98  CO2 22 26  GLUCOSE 116* 113*  BUN 18 31*  CREATININE 1.13 1.32  CALCIUM 8.9 9.2  MG  --  2.0   Liver Function Tests No results found for this basename: AST, ALT, ALKPHOS, BILITOT, PROT, ALBUMIN,  in the last 72 hours No results found for this basename:  LIPASE, AMYLASE,  in the last 72 hours Cardiac Enzymes  Recent Labs  04/17/14 0700 04/18/14 0223 04/18/14 0945  TROPONINI <0.30 <0.30 <0.30   Radiology Studies Imaging results have been reviewed and No results found.  TELE Sinus rhythm with PACs  ECG Sinus rhythm with PACs, lateral ST-T wave changes suggestive of ischemia, unchanged from the day before  ASSESSMENT AND PLAN  For another attempt at cardiac catheterization today. Because of his complaints of chronic pain and anxiety, previous catheterizations had to be performed with monitored anesthesia. The procedure is scheduled for 12 noon. Slight increase in creatinine, will hold diuretics and angiotensin receptor blocker. This procedure has been fully reviewed with the patient and written informed consent has been obtained.    Sanda Klein, MD, [facility] [phone] office [phone] pager 04/19/2014 9:08 AM

## 2014-04-19 NOTE — Transfer of Care (Signed)
Immediate Anesthesia Transfer of Care Note  Patient: Lucas [name]  Procedure(s) Performed: Procedure(s): LEFT HEART CATHETERIZATION WITH CORONARY/GRAFT ANGIOGRAM (N/A)  Patient Location: Cath Lab  Anesthesia Type:General  Level of Consciousness: awake, alert , oriented and patient cooperative  Airway & Oxygen Therapy: Patient Spontanous Breathing and Patient connected to nasal cannula oxygen  Post-op Assessment: Report given to PACU RN, Post -op Vital signs reviewed and stable and Patient moving all extremities X 4  Post vital signs: Reviewed and stable  Complications: No apparent anesthesia complications

## 2014-04-19 NOTE — Progress Notes (Signed)
Voided 300cc of yellow urine

## 2014-04-19 NOTE — Progress Notes (Signed)
Patient Name: Lucas [name] Date of Encounter: 04/19/2014  Principal Problem:   Unstable angina Active Problems:   Essential hypertension   PAD (peripheral artery disease)   PAF (paroxysmal atrial fibrillation)   CKD (chronic kidney disease), stage III   Chronic systolic CHF (congestive heart failure)   COPD exacerbation   Length of Stay: 2  SUBJECTIVE  No further angina although he continues to complain of neck and back pain. Yesterday's cardiac catheterization had to be canceled since he drank coffee with cream and sugar. This morning I found him at the nurse's desk asking for breakfast, although he knows that he is scheduled for the cardiac catheterization with general anesthesia today. I insisted that he limit his intake to small sips of water. The procedure is scheduled in just a few hours.  He has been off oral anticoagulants for more than 48 hours now. Yesterday afternoon his electro-cardiogram showed a heart rate of 99 beats per minute, sinus rhythm with PACs, persistent lateral ST-T wave changes  CURRENT MEDS . aspirin EC  81 mg Oral Daily  . budesonide (PULMICORT) nebulizer solution  0.25 mg Nebulization BID  . doxazosin  8 mg Oral QHS  . doxycycline  100 mg Oral Q12H  . furosemide  20 mg Intravenous Q12H  . ipratropium-albuterol  3 mL Nebulization QID  . irbesartan  75 mg Oral Daily  . methylPREDNISolone (SOLU-MEDROL) injection  40 mg Intravenous Daily  . nitroGLYCERIN  0.4 mg Transdermal Daily  . pantoprazole  40 mg Oral Q1200  . potassium chloride SA  20 mEq Oral Daily  . sodium chloride  3 mL Intravenous Q12H  . sodium chloride  3 mL Intravenous Q12H    OBJECTIVE   Intake/Output Summary (Last 24 hours) at 04/19/14 0908 Last data filed at 04/19/14 0751  Gross per 24 hour  Intake   1350 ml  Output   2620 ml  Net  -1270 ml   Filed Weights   04/17/14 1100 04/18/14 0510 04/19/14 0437  Weight: 100.562 kg (221 lb 11.2 oz) 102.513 kg (226 lb) 102.15 kg (225  lb 3.2 oz)    PHYSICAL EXAM Filed Vitals:   04/18/14 1800 04/18/14 2016 04/19/14 0437 04/19/14 0845  BP: 144/76 113/96 154/85   Pulse: 69 99 66   Temp: 98 F (36.7 C) 97.9 F (36.6 C) 97.9 F (36.6 C)   TempSrc: Oral Oral Oral   Resp: 22 18 18    Height:      Weight:   102.15 kg (225 lb 3.2 oz)   SpO2: 90% 90% 98% 97%   General: Alert, oriented x3, no distress Head: no evidence of trauma, PERRL, EOMI, no exophtalmos or lid lag, no myxedema, no xanthelasma; normal ears, nose and oropharynx Neck: normal jugular venous pulsations and no hepatojugular reflux; brisk carotid pulses without delay and no carotid bruits Chest: Emphysematous chest, diffusely decreased breath sounds, minimal scattered wheezing, no signs of consolidation by percussion or palpation, normal fremitus, symmetrical and full respiratory excursions Cardiovascular: normal position and quality of the apical impulse, regular rhythm, normal first and second heart sounds, no rubs or gallops, no murmur Abdomen: no tenderness or distention, no masses by palpation, no abnormal pulsatility or arterial bruits, normal bowel sounds, no hepatosplenomegaly Extremities: no clubbing, cyanosis or edema; 2+ radial, ulnar and brachial pulses bilaterally; 2+ right femoral, posterior tibial and dorsalis pedis pulses; 2+ left femoral, posterior tibial and dorsalis pedis pulses; no subclavian or femoral bruits Neurological: grossly nonfocal  LABS  CBC  Recent Labs  04/18/14 0433 04/19/14 0424  WBC 13.4* 11.5*  HGB 12.8* 12.6*  HCT 39.3 38.4*  MCV 91.2 91.2  PLT 173 494   Basic Metabolic Panel  Recent Labs  04/17/14 0049 04/18/14 0945  NA 136* 137  K 3.4* 4.1  CL 97 98  CO2 22 26  GLUCOSE 116* 113*  BUN 18 31*  CREATININE 1.13 1.32  CALCIUM 8.9 9.2  MG  --  2.0   Liver Function Tests No results found for this basename: AST, ALT, ALKPHOS, BILITOT, PROT, ALBUMIN,  in the last 72 hours No results found for this basename:  LIPASE, AMYLASE,  in the last 72 hours Cardiac Enzymes  Recent Labs  04/17/14 0700 04/18/14 0223 04/18/14 0945  TROPONINI <0.30 <0.30 <0.30   Radiology Studies Imaging results have been reviewed and No results found.  TELE Sinus rhythm with PACs  ECG Sinus rhythm with PACs, lateral ST-T wave changes suggestive of ischemia, unchanged from the day before  ASSESSMENT AND PLAN  For another attempt at cardiac catheterization today. Because of his complaints of chronic pain and anxiety, previous catheterizations had to be performed with monitored anesthesia. The procedure is scheduled for 12 noon. Slight increase in creatinine, will hold diuretics and angiotensin receptor blocker. This procedure has been fully reviewed with the patient and written informed consent has been obtained.    Sanda Klein, MD, [facility] [phone] office [phone] pager 04/19/2014 9:08 AM

## 2014-04-19 NOTE — Progress Notes (Signed)
Pt arrived to holding area alert and talking.  VS remaining stable.  Dr Crew in to see pt and gave approval for pt to return back to inpatient room in at 1620.

## 2014-04-19 NOTE — Progress Notes (Signed)
[facility] PROGRESS NOTE  Lucas [name] ZDG:[id number] DOB: 1946/07/07 DOA: 04/17/2014 PCP: Salena Saner., MD  Assessment/Plan: 1-unstable angina: no CP currently -will continue heparin IV and follow cardiology rec's -cardiology on board and plan for cath on 10/9 -continue ASA, ARB, NTG; no b-blocker given acute COPD exacerbation  2-COPD with exacerbation: continue solumedrol, nebulizers and antibiotics -breathing and air movement improving -will start steroids tapering in am  3-cardiomyopathy: EF 40-45% -continue ARB and lasix (will switch to PO) -daily weight, strict intake and output  4-hx of PAF: has remained on sinus rhythm -Will resume apixaban once off heparin drip -rate controlled   5-chronic pain: will continue home regimen  6-BPH: continue cardura  7-obesity: low calorie diet, portion control and increase exercise discussed with patient   Code Status: full Family Communication: no family at bedside Disposition Plan: to be determine    Consultants:  Cardiology   Procedures:  See below for x-ray reports  Heart cath in am (10/9)  Antibiotics:  Doxycycline   HPI/Subjective: Afebrile. No CP. Reports pain is better controlled  Objective: Filed Vitals:   04/19/14 1622  BP: 161/78  Pulse: 84  Temp:   Resp:     Intake/Output Summary (Last 24 hours) at 04/19/14 1830 Last data filed at 04/19/14 1828  Gross per 24 hour  Intake 1320.5 ml  Output   2140 ml  Net -819.5 ml   Filed Weights   04/17/14 1100 04/18/14 0510 04/19/14 0437  Weight: 100.562 kg (221 lb 11.2 oz) 102.513 kg (226 lb) 102.15 kg (225 lb 3.2 oz)    Exam:   General:  No CP; reports pain is better control with home regimen. Anxious about cath this afternoon. No fever. Breathing/wheezing better.  Cardiovascular: S1 and S2, no rubs or gallops  Respiratory: mild positive exp wheezing; no frank crackles  Abdomen: soft, obese, no tender, positive  BS  Musculoskeletal: trace edema, good bilaterally muscle strength   Data Reviewed: Basic Metabolic Panel:  Recent Labs Lab 04/17/14 0049 04/18/14 0945  NA 136* 137  K 3.4* 4.1  CL 97 98  CO2 22 26  GLUCOSE 116* 113*  BUN 18 31*  CREATININE 1.13 1.32  CALCIUM 8.9 9.2  MG  --  2.0   CBC:  Recent Labs Lab 04/17/14 0049 04/17/14 0700 04/18/14 0433 04/19/14 0424  WBC 7.7 6.2 13.4* 11.5*  HGB 14.0 13.2 12.8* 12.6*  HCT 41.0 39.2 39.3 38.4*  MCV 88.6 89.1 91.2 91.2  PLT 173 162 173 159   Cardiac Enzymes:  Recent Labs Lab 04/17/14 0700 04/18/14 0223 04/18/14 0945  TROPONINI <0.30 <0.30 <0.30   BNP (last 3 results)  Recent Labs  03/08/14 1154 03/20/14 0906 04/17/14 0049  PROBNP 795.4* 185.0* 1368.0*   CBG:  Recent Labs Lab 04/18/14 2001 04/19/14 0218 04/19/14 0453 04/19/14 0755 04/19/14 1222  GLUCAP 268* 158* 91 95 100*    Recent Results (from the past 240 hour(s))  MRSA PCR SCREENING     Status: None   Collection Time    04/17/14  5:46 AM      Result Value Ref Range Status   MRSA by PCR NEGATIVE  NEGATIVE Final   Comment:            The GeneXpert MRSA Assay (FDA     approved for NASAL specimens     only), is one component of a     comprehensive MRSA colonization     surveillance program. It is not  intended to diagnose MRSA     infection nor to guide or     monitor treatment for     MRSA infections.     Studies: No results found.  Scheduled Meds: . aspirin EC  81 mg Oral Daily  . budesonide (PULMICORT) nebulizer solution  0.25 mg Nebulization BID  . doxazosin  8 mg Oral QHS  . doxycycline  100 mg Oral Q12H  . ipratropium-albuterol  3 mL Nebulization QID  . methylPREDNISolone (SOLU-MEDROL) injection  40 mg Intravenous Daily  . nitroGLYCERIN  0.4 mg Transdermal Daily  . pantoprazole  40 mg Oral Q1200  . sodium chloride  3 mL Intravenous Q12H   Continuous Infusions: . sodium chloride 1 mL/kg/hr (04/19/14 1603)  . heparin       Principal Problem:   Unstable angina Active Problems:   Essential hypertension   PAD (peripheral artery disease)   PAF (paroxysmal atrial fibrillation)   CKD (chronic kidney disease), stage III   Chronic systolic CHF (congestive heart failure)   COPD exacerbation    Time spent: < 30 minutes    Barton Dubois  [facility] Pager [phone]. If 7PM-7AM, please contact night-coverage at [email or web address], password [facility] 04/19/2014, 6:30 PM  LOS: 2 days

## 2014-04-19 NOTE — Progress Notes (Addendum)
ANTICOAGULATION CONSULT NOTE - Follow up  Pharmacy Consult for Heparin Indication: chest pain/ACS  Allergies  Allergen Reactions  . Ativan [Lorazepam] Other (See Comments)    Increases agitation, tolerates xanax  . Lisinopril Cough  . Morphine And Related Other (See Comments)    Hallucinations, too sedated when given with ativan  . Ibuprofen Hives, Nausea And Vomiting and Rash    Tolerates baby aspirin per patient.  There is no allergy to any dose of aspirin. Pt refuses to take 325mg  because it caused nose bleeds.    Patient Measurements: Height: 5\' 8"  (172.7 cm) Weight: 225 lb 3.2 oz (102.15 kg) (scale a) IBW/kg (Calculated) : 68.4 Heparin Dosing Weight: 91kg  Vital Signs: Temp: 97.9 F (36.6 C) (10/09 0437) Temp Source: Oral (10/09 0437) BP: 154/85 mmHg (10/09 0437) Pulse Rate: 66 (10/09 0437)  Labs:  Recent Labs  04/17/14 0049 04/17/14 0700  04/17/14 2000 04/18/14 0223 04/18/14 0433 04/18/14 0945 04/18/14 1342 04/19/14 0424  HGB 14.0 13.2  --   --   --  12.8*  --   --  12.6*  HCT 41.0 39.2  --   --   --  39.3  --   --  38.4*  PLT 173 162  --   --   --  173  --   --  159  APTT  --   --   < > 61*  --  73*  --  76*  --   LABPROT  --   --   --   --   --  14.9  --   --   --   INR  --   --   --   --   --  1.17  --   --   --   HEPARINUNFRC  --   --   < > 0.72*  --  0.78*  --  0.86* 0.58  CREATININE 1.13  --   --   --   --   --  1.32  --   --   TROPONINI  --  <0.30  --   --  <0.30  --  <0.30  --   --   < > = values in this interval not displayed.  Estimated Creatinine Clearance: 62 ml/min (by C-G formula based on Cr of 1.32).    Assessment: 68 y.o male admitted 10/7 with ACS/CP and history of CABG and PAF.  Multiple admission in previous 6 months. Prior to admission patient taking apixaban (Eliquis) for PAF, last PTA dose noted 10/6 (took am and pm doses).  Heparin level this AM = 0.58 is therapeutic on current rate 1350 units/hr. Eliquis has been on hold >48  hours, thus heparin levels should be accurate now. No further aPTT monitoring needed.  Hgb is stable at 12.6. PLTC 159K <173K. SCR 1.32 up from 1.13.  Ecchymosis bilateral arms. No bleeding reported.  Patient scheduled to have cardiac cath with general anesthesia today.    Goal of Therapy:  Heparin level 0.3-0.7 units/ml Monitor platelets by anticoagulation protocol: Yes      Plan:  No change in heparin drip rate of 1350 units/hr.  Daily heparin level and CBC F/u after cath today.  Nicole Cella, RPh Clinical Pharmacist Pager: [phone]  04/19/2014,11:41 AM  Addn: Pt returned from cath lab. Had LIMA to LAD, SVG to OM, and EF 45%. Pharmacy was consulted to restart heparin 6 hours after sheath removal. Sheath was removed at 1528. Pt had been therapeutic  on 1350 units/hr of heparin prior to cath. Will resume at that rate starting at 2130. Per cardiology note, plan is to restart apixaban 10/10.  Andrey Cota. Diona Foley, PharmD Clinical Pharmacist Pager [phone]

## 2014-04-19 NOTE — CV Procedure (Addendum)
       PROCEDURE:  Left heart catheterization with selective coronary angiography, left ventriculogram.  Bypass angiography  INDICATIONS:    The risks, benefits, and details of the procedure were explained to the patient.  The patient verbalized understanding and wanted to proceed.  Informed written consent was obtained.  PROCEDURE TECHNIQUE:  After Xylocaine anesthesia a 76F slender sheath was placed in the last radial artery with a single anterior needle wall stick.   Right coronary angiography was done using a Judkins R4 guide catheter. SVG to RCA was engaged with a JR 4. Left coronary angiography was done using a Judkins L3.5 guide catheter. SVG to OM was engaged with the LCB catheter.   Left ventriculography was done using a pigtail catheter. IMA was engaged with a LIMA catheter. A TR band was used for hemostasis.   CONTRAST:  Total of 115 cc.  COMPLICATIONS:  None.    HEMODYNAMICS:  Aortic pressure was 96/54; LV pressure was 100/14; LVEDP 18.  There was no gradient between the left ventricle and aorta.    ANGIOGRAPHIC DATA:   The left main coronary artery is disease. There is a 50% lesion at the ostium..  The left anterior descending artery is occluded in the midportion.  There several medium-size diagonals prior to the occlusion which are patent. The LIMA to LAD is widely patent. The mid to distal LAD has moderate disease. There are brisk collaterals to the PDA and distal RCA system. There is a large diagonal which also fills retrograde.  The left circumflex artery is a large vessel. There is moderate disease proximally. There are 2 obtuse marginals which are patent. There is a subtotal occlusion in the mid circumflex after an atrial branch. The remainder of the circumflex fills antegrade.  SVG to OM is patent with mild proximal disease.  The right coronary artery is occluded proximally. The distal RCA territory feeds from collaterals from the LAD, which is fed from the LIMA.  The  SVG to RCA is occluded.  LEFT VENTRICULOGRAM:  Left ventricular angiogram was done in the 30 RAO projection and revealed mild global hypokinesis with mildly decreased systolic function and an estimated ejection fraction of 45%.  LVEDP was 18 mmHg.  IMPRESSIONS:  1. Moderately diseased left main coronary artery. 2. Severely diseased native mid left anterior descending artery. Patent LIMA to LAD. 3. Severe disease in the native left circumflex artery and its branches. Patent SVG to OM with mild, proximal disease in the graft. 4. Occluded native right coronary artery.  Left to right collaterals fill the distal RCA. 5. Mildly decreased left ventricular systolic function.  LVEDP 18 mmHg.  Ejection fraction 45%.  RECOMMENDATION:  Continue medical therapy. Cardiac cath was done under general anesthesia today. Given the patient's baseline anxiety, general anesthesia is necessary for this procedure. Followup with Dr. Johnsie Cancel.  Restart Eliquis tomorrow if left radial site is ok.

## 2014-04-20 DIAGNOSIS — I255 Ischemic cardiomyopathy: Secondary | ICD-10-CM

## 2014-04-20 DIAGNOSIS — J9621 Acute and chronic respiratory failure with hypoxia: Secondary | ICD-10-CM

## 2014-04-20 DIAGNOSIS — I2510 Atherosclerotic heart disease of native coronary artery without angina pectoris: Secondary | ICD-10-CM

## 2014-04-20 LAB — GLUCOSE, CAPILLARY
GLUCOSE-CAPILLARY: 144 mg/dL — AB (ref 70–99)
GLUCOSE-CAPILLARY: 106 mg/dL — AB (ref 70–99)
GLUCOSE-CAPILLARY: 99 mg/dL (ref 70–99)

## 2014-04-20 LAB — CBC
HCT: 40.4 % (ref 39.0–52.0)
Hemoglobin: 13.3 g/dL (ref 13.0–17.0)
MCH: 30.3 pg (ref 26.0–34.0)
MCHC: 32.9 g/dL (ref 30.0–36.0)
MCV: 92 fL (ref 78.0–100.0)
PLATELETS: 169 10*3/uL (ref 150–400)
RBC: 4.39 MIL/uL (ref 4.22–5.81)
RDW: 16.2 % — AB (ref 11.5–15.5)
WBC: 9.4 10*3/uL (ref 4.0–10.5)

## 2014-04-20 LAB — HEPARIN LEVEL (UNFRACTIONATED): Heparin Unfractionated: 0.47 IU/mL (ref 0.30–0.70)

## 2014-04-20 MED ORDER — HYDROCODONE-ACETAMINOPHEN 10-325 MG PO TABS: 1.0000 | ORAL_TABLET | ORAL | Status: DC | PRN

## 2014-04-20 MED ORDER — ISOSORBIDE MONONITRATE ER 30 MG PO TB24
30.0000 mg | ORAL_TABLET | Freq: Every day | ORAL | Status: DC
  Filled 2014-04-20: qty 1

## 2014-04-20 MED ORDER — IRBESARTAN 75 MG PO TABS
37.5000 mg | ORAL_TABLET | Freq: Every day | ORAL | Status: DC
  Filled 2014-04-20: qty 0.5

## 2014-04-20 MED ORDER — ISOSORBIDE MONONITRATE ER 30 MG PO TB24: 30.0000 mg | ORAL_TABLET | Freq: Every day | ORAL | Status: DC

## 2014-04-20 MED ORDER — PANTOPRAZOLE SODIUM 40 MG PO TBEC: 40.0000 mg | DELAYED_RELEASE_TABLET | Freq: Every day | ORAL | Status: DC

## 2014-04-20 MED ORDER — DOXYCYCLINE HYCLATE 100 MG PO CAPS
100.0000 mg | ORAL_CAPSULE | Freq: Two times a day (BID) | ORAL | Status: AC
Start: 2014-04-20 — End: 2014-04-26

## 2014-04-20 MED ORDER — HYDROMORPHONE HCL 4 MG PO TABS: 4.0000 mg | ORAL_TABLET | Freq: Four times a day (QID) | ORAL | Status: DC | PRN

## 2014-04-20 MED ORDER — APIXABAN 5 MG PO TABS
5.0000 mg | ORAL_TABLET | Freq: Two times a day (BID) | ORAL | Status: DC
  Administered 2014-04-20: 5 mg via ORAL
  Filled 2014-04-20 (×2): qty 1

## 2014-04-20 MED ORDER — PREDNISONE 10 MG PO TABS: ORAL_TABLET | ORAL | Status: DC

## 2014-04-20 NOTE — Progress Notes (Signed)
Patient ID: Lucas [name], male   DOB: 01/23/1946, 68 y.o.   MRN: [id number] Patient alertx4. Vital signs stable. Complaint of chronic pain at a 8 on a scale of 1-10. Given pain medication see MAR. Discharge instructions reviewed with patient. Verbalized understanding. Prescriptions given and reviewed with patient. Verbalized understanding. Pt escorted off unit by nursing staff with no signs or symptoms of distress.

## 2014-04-20 NOTE — Progress Notes (Signed)
ANTICOAGULATION CONSULT NOTE - Follow Up Consult  Pharmacy Consult for heparin Indication: atrial fibrillation  Labs:  Recent Labs  04/17/14 0700  04/17/14 2000 04/18/14 0223 04/18/14 0433 04/18/14 0945 04/18/14 1342 04/19/14 0424 04/20/14 0343  HGB 13.2  --   --   --  12.8*  --   --  12.6* 13.3  HCT 39.2  --   --   --  39.3  --   --  38.4* 40.4  PLT 162  --   --   --  173  --   --  159 169  APTT  --   < > 61*  --  73*  --  76*  --   --   LABPROT  --   --   --   --  14.9  --   --   --   --   INR  --   --   --   --  1.17  --   --   --   --   HEPARINUNFRC  --   < > 0.72*  --  0.78*  --  0.86* 0.58 0.47  CREATININE  --   --   --   --   --  1.32  --   --   --   TROPONINI <0.30  --   --  <0.30  --  <0.30  --   --   --   < > = values in this interval not displayed.   Assessment/Plan:  68yo male therapeutic on heparin after resumed post-cath w/ plan to resume apixaban today.  Will continue for now without change and f/u w/ plans.  Wynona Neat, PharmD, BCPS  04/20/2014,5:03 AM

## 2014-04-20 NOTE — Discharge Summary (Signed)
Physician Discharge Summary  Lucas [name] NUU:[id number] DOB: 10-11-1945 DOA: 04/17/2014  PCP: Lucas [name]., MD  Admit date: 04/17/2014 Discharge date: 04/20/2014  Time spent: >30 minutes  Recommendations for Outpatient Follow-up:  Check BMET to follow electrolytes and renal function Needs follow up with pain clinic.  Discharge Diagnoses:  Principal Problem:   Unstable angina Active Problems:   Essential hypertension   PAD (peripheral artery disease)   PAF (paroxysmal atrial fibrillation)   CKD (chronic kidney disease), stage III   Chronic systolic CHF (congestive heart failure)   COPD exacerbation   Discharge Condition: stable and improved. Denies SOB and CP at discharge.  Diet recommendation: low sodium diet   Filed Weights   04/18/14 0510 04/19/14 0437 04/20/14 0542  Weight: 102.513 kg (226 lb) 102.15 kg (225 lb 3.2 oz) 100.744 kg (222 lb 1.6 oz)    History of present illness:  Lucas [name] is a chronically ill man with multiple issues. These include COPD with chronic respiratory failure on home oxygen at night, chronic systolic HF with EF of 40 to 45%, remote renal cancer with past nephrectomy with CKD stage III, CAD with past CABG with his LIMA to the LAD patent and the SVG to OM3 but all other SVGs remain occluded. His other issues include anxiety, PAF and long standing tobacco abuse, PAD with past angioplasty in June of 2015 and chronic pain. Presented to ED with SOB and CP. Admitted for further evaluation and treatment of COPD exacerbation and unstable angina.   Hospital Course:  1-unstable angina: no CP currently  -s/p cath (see results below); plan is to continue conservative management -will add imdur to his current regimen to help with angina sx's -continue ASA, ARB, PRN NTG; no b-blocker given severe COPD with acute exacerbation   2-COPD with exacerbation: continue solumedrol, nebulizers and antibiotics  -breathing and air movement improving; good O2  sat on RA. No wheezing -will discharge with steroids tapering, antibiotics and continue use of home inhalers   3-cardiomyopathy: EF 40-45%  -continue ARB and lasix  -daily weight, low sodium diet and fluid restriction discussed with patient   4-hx of PAF: has remained on sinus rhythm  -Will resume apixaban  -rate controlled   5-chronic pain: will continue home regimen  -needs to follow with pain clinic  6-BPH: continue cardura   7-obesity: low calorie diet, portion control and increase exercise discussed with patient   8-GERD: will start treatment with PPI   Procedures: Heart cath 10/9: 1. Moderately diseased left main coronary artery. 2. Severely diseased native mid left anterior descending artery. Patent LIMA to LAD. 3. Severe disease in the native left circumflex artery and its branches. Patent SVG to OM with mild, proximal disease in the graft. 4. Occluded native right coronary artery. Left to right collaterals fill the distal RCA. 5. Mildly decreased left ventricular systolic function. LVEDP 18 mmHg. Ejection fraction 45%.  Consultations:  Cardiology   Discharge Exam: Filed Vitals:   04/20/14 0542  BP: 148/76  Pulse: 67  Temp: 98.1 F (36.7 C)  Resp: 18   General: No CP; reports pain is better control with home regimen. No fever. Breathing back to baseline, no wheezing.  Cardiovascular: S1 and S2, no rubs or gallops  Respiratory: no wheezing; no frank crackles; good air movement and good O2 sat on RA  Abdomen: soft, obese, no tender, positive BS  Musculoskeletal: trace edema, good bilaterally muscle strength and no cyanosis    Discharge Instructions You were cared for  by a hospitalist during your hospital stay. If you have any questions about your discharge medications or the care you received while you were in the hospital after you are discharged, you can call the unit and asked to speak with the hospitalist on call if the hospitalist that took care of you is  not available. Once you are discharged, your primary care physician will handle any further medical issues. Please note that NO REFILLS for any discharge medications will be authorized once you are discharged, as it is imperative that you return to your primary care physician (or establish a relationship with a primary care physician if you do not have one) for your aftercare needs so that they can reassess your need for medications and monitor your lab values.  Discharge Instructions   Diet - low sodium heart healthy    Complete by:  As directed      Discharge instructions    Complete by:  As directed   Take medications as prescribed Please avoid second hand smoking Follow a low sodium diet Check your weight on daily basis Follow with PCP in 1 week Arrange follow up with pain clinic Follow instructions for steroids tapering dosage          Current Discharge Medication List    START taking these medications   Details  doxycycline (VIBRAMYCIN) 100 MG capsule Take 1 capsule (100 mg total) by mouth 2 (two) times daily. Qty: 12 capsule, Refills: 0    isosorbide mononitrate (IMDUR) 30 MG 24 hr tablet Take 1 tablet (30 mg total) by mouth daily. Qty: 30 tablet, Refills: 1    pantoprazole (PROTONIX) 40 MG tablet Take 1 tablet (40 mg total) by mouth daily at 12 noon. Qty: 30 tablet, Refills: 1      CONTINUE these medications which have CHANGED   Details  HYDROcodone-acetaminophen (NORCO) 10-325 MG per tablet Take 1 tablet by mouth every 4 (four) hours as needed for moderate pain (will be ok to take up to 5 times a day if needed; extra dose 2-3 hours appart). Qty: 45 tablet, Refills: 0    HYDROmorphone (DILAUDID) 4 MG tablet Take 1 tablet (4 mg total) by mouth every 6 (six) hours as needed for severe pain (if pain no relived by hydrocodone). Qty: 40 tablet, Refills: 0   Associated Diagnoses: Acute systolic heart failure    predniSONE (DELTASONE) 10 MG tablet Take 5 tabs X 1 day; then 4  tablets by mouth daily X 2 days; then 2 tablet by mouth X 3 days; then resume daily 10mg  dose. Qty: 20 tablet, Refills: 0   Associated Diagnoses: Acute systolic heart failure      CONTINUE these medications which have NOT CHANGED   Details  albuterol (PROVENTIL HFA;VENTOLIN HFA) 108 (90 BASE) MCG/ACT inhaler Inhale 2 puffs into the lungs every 6 (six) hours as needed for wheezing or shortness of breath. Shortness of breath Qty: 1 Inhaler, Refills: 5    albuterol (PROVENTIL) (2.5 MG/3ML) 0.083% nebulizer solution Take 2.5 mg by nebulization 4 (four) times daily.    ALPRAZolam (XANAX) 1 MG tablet Take 1 tablet (1 mg total) by mouth 3 (three) times daily as needed for anxiety. Qty: 30 tablet, Refills: 1    apixaban (ELIQUIS) 5 MG TABS tablet Take 1 tablet (5 mg total) by mouth 2 (two) times daily. Qty: 60 tablet, Refills: 1    aspirin EC 81 MG tablet Take 81 mg by mouth daily.    doxazosin (CARDURA) 8 MG  tablet Take 8 mg by mouth at bedtime.     furosemide (LASIX) 20 MG tablet Take 1 tablet in the morning and 2 tabs at bedtime. SEND FURTHER REFILLS TO PCP or Cardiologists Qty: 90 tablet, Refills: 0    ipratropium (ATROVENT) 0.02 % nebulizer solution Take 0.5 mg by nebulization 4 (four) times daily.    Ipratropium-Albuterol (COMBIVENT RESPIMAT) 20-100 MCG/ACT AERS respimat Inhale 1 puff into the lungs 4 (four) times daily. Qty: 1 Inhaler, Refills: 5    mometasone-formoterol (DULERA) 100-5 MCG/ACT AERO Inhale 2 puffs into the lungs 2 (two) times daily. Qty: 1 Inhaler, Refills: 0    nitroGLYCERIN (NITROSTAT) 0.3 MG SL tablet Place 0.3 mg under the tongue every 5 (five) minutes as needed for chest pain.    potassium chloride SA (K-DUR,KLOR-CON) 20 MEQ tablet Take 1 tablet (20 mEq total) by mouth daily. Qty: 30 tablet, Refills: 6   Associated Diagnoses: Hypokalemia    valsartan (DIOVAN) 40 MG tablet Take 40 mg by mouth daily.        Allergies  Allergen Reactions  . Ativan  [Lorazepam] Other (See Comments)    Increases agitation, tolerates xanax  . Lisinopril Cough  . Morphine And Related Other (See Comments)    Hallucinations, too sedated when given with ativan  . Ibuprofen Hives, Nausea And Vomiting and Rash    Tolerates baby aspirin per patient.  There is no allergy to any dose of aspirin. Pt refuses to take 325mg  because it caused nose bleeds.   Follow-up Information   Follow up with Lucas [name]., MD. Schedule an appointment as soon as possible for a visit in 1 week.   Specialty:  Internal Medicine   Contact information:   [address] [place] [address] [phone]       The results of significant diagnostics from this hospitalization (including imaging, microbiology, ancillary and laboratory) are listed below for reference.    Significant Diagnostic Studies: Dg Pelvis 1-2 Views  04/17/2014   CLINICAL DATA:  Chronic left hip pain, with difficulty walking. Initial encounter.  EXAM: PELVIS - 1-2 VIEW  COMPARISON:  CT of the abdomen and pelvis performed 08/22/2013, and abdominal radiograph performed 04/27/2012  FINDINGS: There is no evidence of fracture or dislocation. Both femoral heads are seated normally within their respective acetabula. No significant degenerative change is appreciated. The sacroiliac joints are unremarkable in appearance.  The visualized bowel gas pattern is grossly unremarkable in appearance.  IMPRESSION: No evidence of fracture or dislocation. No significant degenerative change seen. Joint spaces are preserved.   Electronically Signed   By: Garald Balding M.D.   On: 04/17/2014 03:19   Dg Chest Port 1 View  04/17/2014   CLINICAL DATA:  Initial encounter for severe chest pain.  EXAM: PORTABLE CHEST - 1 VIEW  COMPARISON:  Two-view chest 03/09/2014.  FINDINGS: Heart is mildly enlarged. Lung volumes are low. Mild bibasilar airspace disease likely reflects atelectasis. The patient is status post median sternotomy  for CABG. Mild interstitial prominence is increased.  IMPRESSION: 1. Mild cardiac enlargement and increasing interstitial prominence suggesting mild edema. 2. Mild bibasilar airspace disease likely reflects atelectasis. Early infection is considered less likely.   Electronically Signed   By: Lawrence Santiago M.D.   On: 04/17/2014 01:28    Microbiology: Recent Results (from the past 240 hour(s))  MRSA PCR SCREENING     Status: None   Collection Time    04/17/14  5:46 AM      Result  Value Ref Range Status   MRSA by PCR NEGATIVE  NEGATIVE Final   Comment:            The GeneXpert MRSA Assay (FDA     approved for NASAL specimens     only), is one component of a     comprehensive MRSA colonization     surveillance program. It is not     intended to diagnose MRSA     infection nor to guide or     monitor treatment for     MRSA infections.     Labs: Basic Metabolic Panel:  Recent Labs Lab 04/17/14 0049 04/18/14 0945  NA 136* 137  K 3.4* 4.1  CL 97 98  CO2 22 26  GLUCOSE 116* 113*  BUN 18 31*  CREATININE 1.13 1.32  CALCIUM 8.9 9.2  MG  --  2.0   CBC:  Recent Labs Lab 04/17/14 0049 04/17/14 0700 04/18/14 0433 04/19/14 0424 04/20/14 0343  WBC 7.7 6.2 13.4* 11.5* 9.4  HGB 14.0 13.2 12.8* 12.6* 13.3  HCT 41.0 39.2 39.3 38.4* 40.4  MCV 88.6 89.1 91.2 91.2 92.0  PLT 173 162 173 159 169   Cardiac Enzymes:  Recent Labs Lab 04/17/14 0700 04/18/14 0223 04/18/14 0945  TROPONINI <0.30 <0.30 <0.30   BNP: BNP (last 3 results)  Recent Labs  03/08/14 1154 03/20/14 0906 04/17/14 0049  PROBNP 795.4* 185.0* 1368.0*   CBG:  Recent Labs Lab 04/19/14 0755 04/19/14 1222 04/20/14 0113 04/20/14 0752 04/20/14 1201  GLUCAP 95 100* 144* 106* 99    Signed:  Barton Dubois  [facility] 04/20/2014, 12:25 PM

## 2014-04-20 NOTE — Progress Notes (Signed)
Primary cardiologist: Dr. Jenkins Rouge  Subjective:   Patient up standing at his doorway in the hall. No chest pain or shortness of breath currently.   Objective:   Temp:  [97.7 F (36.5 C)-98.1 F (36.7 C)] 98.1 F (36.7 C) (10/10 0542) Pulse Rate:  [67-88] 67 (10/10 0542) Resp:  [15-22] 18 (10/10 0542) BP: (99-179)/(54-109) 148/76 mmHg (10/10 0542) SpO2:  [90 %-99 %] 95 % (10/10 0542) Weight:  [222 lb 1.6 oz (100.744 kg)] 222 lb 1.6 oz (100.744 kg) (10/10 0542) Last BM Date: 04/19/14  Filed Weights   04/18/14 0510 04/19/14 0437 04/20/14 0542  Weight: 226 lb (102.513 kg) 225 lb 3.2 oz (102.15 kg) 222 lb 1.6 oz (100.744 kg)    Intake/Output Summary (Last 24 hours) at 04/20/14 0926 Last data filed at 04/20/14 0541  Gross per 24 hour  Intake   1307 ml  Output   1440 ml  Net   -133 ml    Telemetry: Sinus rhythm.  Exam:  General: Appears comfortable.  Lungs: Decreased breath sounds throughout, nonlabored.  Cardiac: RRR, no gallop.  Extremities: Stable radial catheterization site.   Lab Results:  Basic Metabolic Panel:  Recent Labs Lab 04/17/14 0049 04/18/14 0945  NA 136* 137  K 3.4* 4.1  CL 97 98  CO2 22 26  GLUCOSE 116* 113*  BUN 18 31*  CREATININE 1.13 1.32  CALCIUM 8.9 9.2  MG  --  2.0    CBC:  Recent Labs Lab 04/18/14 0433 04/19/14 0424 04/20/14 0343  WBC 13.4* 11.5* 9.4  HGB 12.8* 12.6* 13.3  HCT 39.3 38.4* 40.4  MCV 91.2 91.2 92.0  PLT 173 159 169    Cardiac Enzymes:  Recent Labs Lab 04/17/14 0700 04/18/14 0223 04/18/14 0945  TROPONINI <0.30 <0.30 <0.30     Medications:   Scheduled Medications: . aspirin EC  81 mg Oral Daily  . budesonide (PULMICORT) nebulizer solution  0.25 mg Nebulization BID  . doxazosin  8 mg Oral QHS  . doxycycline  100 mg Oral Q12H  . ipratropium-albuterol  3 mL Nebulization QID  . nitroGLYCERIN  0.4 mg Transdermal Daily  . pantoprazole  40 mg Oral Q1200  . predniSONE  50 mg Oral Q  breakfast  . sodium chloride  3 mL Intravenous Q12H     Infusions: . heparin 1,350 Units/hr (04/20/14 0528)     PRN Medications:  acetaminophen, acetaminophen, albuterol, ALPRAZolam, fentaNYL, HYDROcodone-acetaminophen, HYDROmorphone, ondansetron (ZOFRAN) IV, ondansetron   Assessment:   1. Angina symptoms. Plan is medical therapy at this time.  2. Multivessel CAD status post CABG. Status post cardiac catheterization under general anesthesia on October 9th. Patent LIMA to LAD, patent SVG to OM with mild proximal graft disease, occluded RCA with left-to-right collaterals filling the distal vessel, LVEDP 18 mm mercury. LVEF approximately 45%. Plan is for medical therapy.  3. Anxiety.  4. Paroxysmal atrial fibrillation.  5. COPD with chronic respiratory failure.  6. Chronic pain with opiate dependency.  7. Tobacco abuse.  8. Peripheral arterial disease.   Plan/Discussion:    Currently on aspirin and nitroglycerin patch. No beta blocker presumably due to COPD. Reported allergy to ACE inhibitor, but was on Diovan as an outpatient. Would resume Eliquis today, stop nitroglycerine patch and start Imdur 30 mg daily. Resume Diovan at 40 mg daily. He was also on Lasix at home. No further cardiac testing now, stable for discharge. He would need a followup visit with Dr. Johnsie Cancel or associate in the next  2 weeks and should have followup BMET.   Satira Sark, M.D., F.A.C.C.

## 2014-04-23 ENCOUNTER — Telehealth: Payer: Self-pay | Admitting: Surgery

## 2014-04-23 NOTE — Progress Notes (Signed)
Transitional Care Clinic Care Coordination Note:  Admit date:  04/17/14 Discharge date: 04/20/14 Discharge Disposition: Home/ self care Patient contact information:  [phone]  Patient evaluated for community Transitional Care Management services with TCC Program as a benefit of patient's [employer]. Discussed TCC Care Management services. TCC Services has been accepted by written consent. Lucas [name] (friend) [phone] is patient's authorized contact. PMH COPD with chronic respiratory failure on home oxygen at night, chronic systolic HF with EF of 40 to 45%, remote renal cancer with past nephrectomy with CKD stage III, CAD with past CABG with his LIMA to the LAD patent and the SVG to OM3 but all other SVGs remain occluded. His other issues include anxiety, PAF and long standing tobacco abuse, PAD with past angioplasty in June of 2015 and chronic pain. Patient has had 8 hospitalizations in the past 6 month. Patient verbalized the Dr. Karlton Lemon is no longer his PCP, she has left the practice and she does not accepts [facility]. He will need a new PCP.  TCC patient will receive a post discharge transition of care call 24- 48 hours, and will be evaluated for close f/u for up to initial 30 days post discharge.  TCC has Scheduled post discharge f/u visit for assessments and disease process education for 04/26/14 at 10:00 am.  Explained that TCC Care Management services does not replace or interfere with any services that are arranged by inpatient case management or social work. For additional questions or referrals please contact   [facility] at [phone]    Consented for Chronic Care Management for [facility] by patient.  Patient scheduled for Transitional Care appointment on Friday 03/27/14 at 10:00.  Clinic information and appointment time provided to patient.   Hospital Course:  1-unstable angina: no CP currently  -s/p cath (see results below); plan is to continue  conservative management  -will add imdur to his current regimen to help with angina sx's  -continue ASA, ARB, PRN NTG; no b-blocker given severe COPD with acute exacerbation  2-COPD with exacerbation: continue solumedrol, nebulizers and antibiotics  -breathing and air movement improving; good O2 sat on RA. No wheezing  -will discharge with steroids tapering, antibiotics and continue use of home inhalers  3-cardiomyopathy: EF 40-45%  -continue ARB and lasix  -daily weight, low sodium diet and fluid restriction discussed with patient  4-hx of PAF: has remained on sinus rhythm  -Will resume apixaban  -rate controlled  5-chronic pain: will continue home regimen  -needs to follow with pain clinic  6-BPH: continue cardura  7-obesity: low calorie diet, portion control and increase exercise discussed with patient  8-GERD: will start treatment with PPI    Procedures:  Heart cath 04/19/14:  1. Moderately diseased left main coronary artery. 2. Severely diseased native mid left anterior descending artery. Patent LIMA to LAD. 3. Severe disease in the native left circumflex artery and its branches. Patent SVG to OM with mild, proximal disease in the graft. 4. Occluded native right coronary artery. Left to right collaterals fill the distal RCA. 5. Mildly decreased left ventricular systolic function. LVEDP 18 mmHg. Ejection fraction 45%.  Assessment:         Home Environment: lives at home alone       Support System: friend       Level of functioning: independent       Home DME: cane/Home O2 ([employer])       Home care services:  Transportation: private vehicle       Medications: Patient denies having any difficulties obtain medication regularly       Pharmacy: [facility] [phone])        Identified Barriers:         PCP: No PCP (Desires to transition to [facility])           Last office visit  Arranged services:    Services communicated to Dr. Dyann Kief  Patient Education:     Patient verbalizes understanding of [facility] services and aware of appointment time/location. All information provided.

## 2014-04-23 NOTE — Telephone Encounter (Signed)
Transitional Care Clinic Post-discharge Follow-Up Phone Call:  Date of Discharge: 04/20/2014  Discharge Diagnoses:  Principal Problem:  Unstable angina  Active Problems:  Essential hypertension  PAD (peripheral artery disease)  PAF (paroxysmal atrial fibrillation)  CKD (chronic kidney disease), stage III  Chronic systolic CHF (congestive heart failure)  COPD exacerbation  Discharge Condition: stable and improved. Denies SOB and CP at discharge.   Post-discharge Communication:  Call Completed: Yes, spoke with patient on 04/23/14 at 1430 for 45 minutes.  Interpreter Needed: No  Please check all that apply:  X Patient states he is knowledgeable of his condition(s) and treatment.  ? Family and/or caregiver is knowledgeable of patient's condition(s) and/or treatment.  ? Patient is caring for self at home. Patient states he checks weight daily  ? Patient needs a referral for Pain Management.  Medication Reconciliation:  X Medication list reviewed with patient.  X Patient able to obtain needed medications. Patient stated he need a refill for Cardura 8mg  and Nitro 0.3mg  SL for CP. Contacted Dr. Dyann Kief discharging MD concerning renewals of prescriptions. Patient forgot to mention, he currently does not have a PCP. Contacted [facility], 1 month renewal ok'd by Pharmacy. Activities of Daily Living:  X Independent  ? Needs assist  ? Total Care (describe)  Community resources in place for patient:  X None  ?Home Health   ? Assisted Living  ? Hospice  ? Support Group  Patient Education: Patient denies need for education at this time  Topics discussed: Patient reminded of his TCC appt on 04/26/14 at [place].  Questions/Concerns discussed: Patient denies questions or concerns at this time. Patient took TCC phone number and will call us if any concerns arise.

## 2014-04-23 NOTE — Telephone Encounter (Signed)
TCC Care Coordinator attempted to contact patient by phone LVM will make 2nd attempt tomorrow.

## 2014-04-26 ENCOUNTER — Ambulatory Visit: Payer: PRIVATE HEALTH INSURANCE | Attending: Internal Medicine | Admitting: Internal Medicine

## 2014-04-26 VITALS — BP 152/72 | HR 77 | Temp 98.0°F | Ht 68.5 in | Wt 224.0 lb

## 2014-04-26 DIAGNOSIS — Z9079 Acquired absence of other genital organ(s): Secondary | ICD-10-CM | POA: Diagnosis not present

## 2014-04-26 DIAGNOSIS — C61 Malignant neoplasm of prostate: Secondary | ICD-10-CM | POA: Diagnosis not present

## 2014-04-26 DIAGNOSIS — Z9981 Dependence on supplemental oxygen: Secondary | ICD-10-CM | POA: Diagnosis not present

## 2014-04-26 DIAGNOSIS — Z2821 Immunization not carried out because of patient refusal: Secondary | ICD-10-CM | POA: Insufficient documentation

## 2014-04-26 DIAGNOSIS — Z7982 Long term (current) use of aspirin: Secondary | ICD-10-CM | POA: Insufficient documentation

## 2014-04-26 DIAGNOSIS — E669 Obesity, unspecified: Secondary | ICD-10-CM | POA: Diagnosis not present

## 2014-04-26 DIAGNOSIS — Z7951 Long term (current) use of inhaled steroids: Secondary | ICD-10-CM | POA: Insufficient documentation

## 2014-04-26 DIAGNOSIS — Z905 Acquired absence of kidney: Secondary | ICD-10-CM | POA: Insufficient documentation

## 2014-04-26 DIAGNOSIS — J9620 Acute and chronic respiratory failure, unspecified whether with hypoxia or hypercapnia: Secondary | ICD-10-CM | POA: Diagnosis not present

## 2014-04-26 DIAGNOSIS — N183 Chronic kidney disease, stage 3 (moderate): Secondary | ICD-10-CM | POA: Diagnosis not present

## 2014-04-26 DIAGNOSIS — Z720 Tobacco use: Secondary | ICD-10-CM | POA: Insufficient documentation

## 2014-04-26 DIAGNOSIS — I480 Paroxysmal atrial fibrillation: Secondary | ICD-10-CM | POA: Diagnosis not present

## 2014-04-26 DIAGNOSIS — I25110 Atherosclerotic heart disease of native coronary artery with unstable angina pectoris: Secondary | ICD-10-CM | POA: Diagnosis not present

## 2014-04-26 DIAGNOSIS — M542 Cervicalgia: Secondary | ICD-10-CM | POA: Diagnosis not present

## 2014-04-26 DIAGNOSIS — J441 Chronic obstructive pulmonary disease with (acute) exacerbation: Secondary | ICD-10-CM | POA: Insufficient documentation

## 2014-04-26 DIAGNOSIS — I70219 Atherosclerosis of native arteries of extremities with intermittent claudication, unspecified extremity: Secondary | ICD-10-CM | POA: Diagnosis not present

## 2014-04-26 DIAGNOSIS — I255 Ischemic cardiomyopathy: Secondary | ICD-10-CM | POA: Diagnosis not present

## 2014-04-26 DIAGNOSIS — I129 Hypertensive chronic kidney disease with stage 1 through stage 4 chronic kidney disease, or unspecified chronic kidney disease: Secondary | ICD-10-CM | POA: Diagnosis not present

## 2014-04-26 DIAGNOSIS — G8929 Other chronic pain: Secondary | ICD-10-CM | POA: Insufficient documentation

## 2014-04-26 DIAGNOSIS — Z7952 Long term (current) use of systemic steroids: Secondary | ICD-10-CM | POA: Insufficient documentation

## 2014-04-26 DIAGNOSIS — C649 Malignant neoplasm of unspecified kidney, except renal pelvis: Secondary | ICD-10-CM | POA: Diagnosis not present

## 2014-04-26 DIAGNOSIS — J440 Chronic obstructive pulmonary disease with acute lower respiratory infection: Secondary | ICD-10-CM | POA: Insufficient documentation

## 2014-04-26 DIAGNOSIS — Z23 Encounter for immunization: Secondary | ICD-10-CM | POA: Diagnosis not present

## 2014-04-26 DIAGNOSIS — Z6833 Body mass index (BMI) 33.0-33.9, adult: Secondary | ICD-10-CM | POA: Diagnosis not present

## 2014-04-26 DIAGNOSIS — M549 Dorsalgia, unspecified: Secondary | ICD-10-CM | POA: Diagnosis not present

## 2014-04-26 DIAGNOSIS — Z9119 Patient's noncompliance with other medical treatment and regimen: Secondary | ICD-10-CM | POA: Diagnosis not present

## 2014-04-26 DIAGNOSIS — R918 Other nonspecific abnormal finding of lung field: Secondary | ICD-10-CM | POA: Insufficient documentation

## 2014-04-26 DIAGNOSIS — E119 Type 2 diabetes mellitus without complications: Secondary | ICD-10-CM | POA: Insufficient documentation

## 2014-04-26 DIAGNOSIS — I739 Peripheral vascular disease, unspecified: Secondary | ICD-10-CM | POA: Diagnosis not present

## 2014-04-26 DIAGNOSIS — F4322 Adjustment disorder with anxiety: Secondary | ICD-10-CM | POA: Insufficient documentation

## 2014-04-26 DIAGNOSIS — E876 Hypokalemia: Secondary | ICD-10-CM

## 2014-04-26 DIAGNOSIS — I5022 Chronic systolic (congestive) heart failure: Secondary | ICD-10-CM

## 2014-04-26 DIAGNOSIS — J209 Acute bronchitis, unspecified: Secondary | ICD-10-CM | POA: Diagnosis not present

## 2014-04-26 LAB — BASIC METABOLIC PANEL
BUN: 24 mg/dL — ABNORMAL HIGH (ref 6–23)
CHLORIDE: 101 meq/L (ref 96–112)
CO2: 28 mEq/L (ref 19–32)
CREATININE: 1.36 mg/dL — AB (ref 0.50–1.35)
Calcium: 9.3 mg/dL (ref 8.4–10.5)
Glucose, Bld: 91 mg/dL (ref 70–99)
Potassium: 3.9 mEq/L (ref 3.5–5.3)
SODIUM: 142 meq/L (ref 135–145)

## 2014-04-26 MED ORDER — FUROSEMIDE 20 MG PO TABS: ORAL_TABLET | ORAL | Status: DC

## 2014-04-26 MED ORDER — POTASSIUM CHLORIDE CRYS ER 20 MEQ PO TBCR: 20.0000 meq | EXTENDED_RELEASE_TABLET | Freq: Every day | ORAL | Status: DC

## 2014-04-26 NOTE — Progress Notes (Signed)
Patient presents for first TCC visit.  States that he has home pulseox. States SPO2 level 89% this AM Uses O2, 2l via nasal canula at home

## 2014-04-26 NOTE — Progress Notes (Signed)
Patient ID: [name], male   DOB: 1945/08/24, 68 y.o.   MRN: [id number]                                      [facility] Discharge Acute Issues Care Follow Up                                                                        Patient Demographics  Lucas [name], is a 68 y.o. male  DOB February 13, 1946  MRN [id number].  Primary MD  Angelica Chessman, MD   Reason for TCC follow Up - monitoring of chronic systolic heart failure, BMP on diuretic dose and weight.   Past Medical History  Diagnosis Date  . CAD (coronary artery disease)     a. s/p CABG x 5;  b. 04/2012 Cath: patent LIMA->LAD and VG->OM3, all other grafts occluded. c. Cath 12/2013: patent SVG-OM3 & LIMA-LAD, known occ other VG.  Marland Kitchen Hypertension   . Anxiety   . Adjustment disorder with anxiety   . Hyperlipidemia     a. Unable to take statins.  Marland Kitchen BPH (benign prostatic hypertrophy)   . COPD (chronic obstructive pulmonary disease)     a. on home O2.  . Arthritis   . Ischemic cardiomyopathy     a. 06/2013 Echo: EF 30-35%, no reg wma's, Gr2 DD, triv AI, mod dil LA, nl RV fxn.  . Chronic systolic CHF (congestive heart failure)     a. 06/2013 Echo: EF 30-35%. b. 11/08/13 EF 40-45%, LVH, HK of the mid-distalanterseptal myocardium, trivial Ao regurg, calcified mitral annulus, RA mildly dilated  . Prostate cancer dx'd 2012  . Renal cancer dx'd 1997    lt nephrectomy  . CKD (chronic kidney disease), stage III   . Tobacco abuse     a. 72 yr hx as of 2015, transitioned to e-cig.  Marland Kitchen Syncope     a. 08/2013: the day following cath, no injury, had received multiple doses of Versed for anxiety - this was felt to be a contributing factor.  Marland Kitchen PAF (paroxysmal atrial fibrillation)     a. on Eliquis 5 mg bid  . Hypotension     a. H/o soft BP prohibiting ACEI/ARB use.  . Chronic pain   . PAD (peripheral artery disease)     a. 12/2013: PV angio s/p angioplasty to R external iliac artery stenosis, also  has R SFA occlusion with reconstitution of above-knee popliteal artery, 2 vessel runoff via peroneal and posterior tib.b. LE doppler 02/2014 ABI right 0.52, left 0.95  . Pulmonary nodules     a. 12/2013:  small pulmonary nodules measuring up to 68mm in RM/LL, f/u recommended 6-12 months.    Past Surgical History  Procedure Laterality Date  . Coronary artery bypass graft      x 5  . Cardiac catheterization    . Nephrectomy      left nephrectomy for ca  . Posterior cervical laminectomy      x 8   limited ROM  and can't lie flat  . Heart stents      x 5  .  Robot assisted laparoscopic radical prostatectomy      for prostate cancer  . Cystoscopy with litholapaxy  05/01/2012    Procedure: CYSTOSCOPY WITH LITHOLAPAXY;  Surgeon: Dutch Gray, MD;  Location: [facility]S;  Service: Urology;  Laterality: N/A;  . Prostate cancer    . Kidney surgery Left 1994       Recent HPI and Hospital Course  Lucas [name] is a chronically ill man with multiple issues. These include COPD with chronic respiratory failure on home oxygen at night, chronic systolic HF with EF of 40 to 45%, remote renal cancer with past nephrectomy with CKD stage III, CAD with past CABG with his LIMA to the LAD patent and the SVG to OM3 but all other SVGs remain occluded. His other issues include anxiety, PAF and long standing tobacco abuse, PAD with past angioplasty in June of 2015 and chronic pain. Presented to ED with SOB and CP. Admitted for further evaluation and treatment of COPD exacerbation and unstable angina.   [facility] Acute Care Issue to be followed in the Clinic   1. Chest pain requiring recent hospitalization and left heart cath. Continue medical treatment which includes aspirin, ARB, he did when necessary nitroglycerin, refuses to take Imdur, not on beta blocker due to advanced COPD. Stable no acute symptoms. Will follow with his primary cardiologist Dr. Johnsie Cancel in one week.    2. COPD exacerbation. Counseled to  continue abstaining from smoking says he is smoking E. cigarettes now, on home oxygen which he does not wear in daytime counseled to wear them, his breathing is at or better than baseline, continue nebulizer treatments as before, finishing steroid taper. No wheezing on exam today.    3. History of paroxysmal atrial fibrillation. In rate control, continue Eliquis. Not on beta blocker due to COPD. Continue follow with primary cardiologist.    4. Chronic systolic heart failure with ischemic cardiomyopathy EF 40-45%. On combination of ARB along with Lasix, he had stopped taking his potassium he is counseled to continue taking, draw BMP, he says his weight has been stable between 222 and 224 pounds daily, his swelling in lower extremities is going down, counseled on and salt restriction, continue present BMP and potassium supplements. Refuses to take Imdur despite counseling. Draw BMP today to get back in one week.     5. Chronic Neck neck and back Pain. Continue home regimen.    6. Essential hypertension. Blood pressure slightly elevated but he says he is slightly anxious, monitor on present regimen repeat blood pressure next week when he gets her period.     Subjective:   Lucas [name] today has, No headache, No chest pain, No abdominal pain - No Nausea, No new weakness tingling or numbness, No Cough - SOB. His edema is going down.  Assessment & Plan    Patient Active Problem List   Diagnosis Date Noted  . Unstable angina 04/17/2014  . Lung nodule 03/09/2014  . Acute systolic heart failure 54/27/0623  . COPD exacerbation 02/01/2014  . Chronic pain   . PAF (paroxysmal atrial fibrillation)   . CKD (chronic kidney disease), stage III   . Renal cancer   . Chronic systolic CHF (congestive heart failure)   . CAD in native artery   . Acute bronchitis 01/31/2014  . Acute on chronic respiratory failure 01/31/2014  . Pulmonary nodules 01/29/2014  . Chronic chest pain - Musculoskeletal  01/07/2014  . COPD, moderate 12/31/2013  . PAD (peripheral artery disease) 12/25/2013  . Atherosclerosis  of native arteries of the extremities with intermittent claudication 12/10/2013  . Acute on chronic systolic CHF (congestive heart failure) 11/14/2013  . Ischemic cardiomyopathy   . Borderline diabetes 06/28/2013  . OSA (obstructive sleep apnea) 06/13/2013  . Non compliance w medication regimen 04/23/2013  . Acute exacerbation of chronic obstructive pulmonary disease (COPD) 04/03/2013  . Obesity 06/05/2012  . CKD (chronic kidney disease) stage 1, GFR 90 ml/min or greater 04/20/2012  . Adjustment disorder with anxiety 11/04/2008  . ADENOCARCINOMA, PROSTATE 09/23/2008  . SMOKER 09/23/2008  . Essential hypertension 09/23/2008      Reason for frequent admissions/ER visits CHF exacerbation  Health Maintenance influenza shot, refused Pneumovax  Next follow up visit at [facility] - one week for BMP check and weight monitoring  Any follow ups or referrals to follow with his primary cardiologist Dr. Johnsie Cancel in a week      Objective:   Filed Vitals:   04/26/14 0942  BP: 152/72  Pulse: 77  Temp: 98 F (36.7 C)  TempSrc: Oral  Height: 5' 8.5" (1.74 m)  Weight: 224 lb (101.606 kg)  SpO2: 96%    Wt Readings from Last 3 Encounters:  04/26/14 224 lb (101.606 kg)  04/20/14 222 lb 1.6 oz (100.744 kg)  04/20/14 222 lb 1.6 oz (100.744 kg)      Medication List       This list is accurate as of: 04/26/14 10:06 AM.  Always use your most recent med list.               albuterol (2.5 MG/3ML) 0.083% nebulizer solution  Commonly known as:  PROVENTIL  Take 2.5 mg by nebulization 4 (four) times daily.     albuterol 108 (90 BASE) MCG/ACT inhaler  Commonly known as:  PROVENTIL HFA;VENTOLIN HFA  Inhale 2 puffs into the lungs every 6 (six) hours as needed for wheezing or shortness of breath. Shortness of breath     ALPRAZolam 1 MG tablet  Commonly known as:  XANAX  Take 1 tablet (1  mg total) by mouth 3 (three) times daily as needed for anxiety.     apixaban 5 MG Tabs tablet  Commonly known as:  ELIQUIS  Take 1 tablet (5 mg total) by mouth 2 (two) times daily.     aspirin EC 81 MG tablet  Take 81 mg by mouth daily.     doxazosin 8 MG tablet  Commonly known as:  CARDURA  Take 8 mg by mouth at bedtime.     doxycycline 100 MG capsule  Commonly known as:  VIBRAMYCIN  Take 1 capsule (100 mg total) by mouth 2 (two) times daily.     furosemide 20 MG tablet  Commonly known as:  LASIX  Take 1 tablet in the morning and 2 tabs at bedtime. SEND FURTHER REFILLS TO PCP or Cardiologists     HYDROcodone-acetaminophen 10-325 MG per tablet  Commonly known as:  NORCO  Take 1 tablet by mouth every 4 (four) hours as needed for moderate pain (will be ok to take up to 5 times a day if needed; extra dose 2-3 hours appart).     HYDROmorphone 4 MG tablet  Commonly known as:  DILAUDID  Take 1 tablet (4 mg total) by mouth every 6 (six) hours as needed for severe pain (if pain no relived by hydrocodone).     ipratropium 0.02 % nebulizer solution  Commonly known as:  ATROVENT  Take 0.5 mg by nebulization 4 (four) times daily.  Ipratropium-Albuterol 20-100 MCG/ACT Aers respimat  Commonly known as:  COMBIVENT RESPIMAT  Inhale 1 puff into the lungs 4 (four) times daily.     isosorbide mononitrate 30 MG 24 hr tablet  Commonly known as:  IMDUR  Take 1 tablet (30 mg total) by mouth daily.     mometasone-formoterol 100-5 MCG/ACT Aero  Commonly known as:  DULERA  Inhale 2 puffs into the lungs 2 (two) times daily.     nitroGLYCERIN 0.3 MG SL tablet  Commonly known as:  NITROSTAT  Place 0.3 mg under the tongue every 5 (five) minutes as needed for chest pain.     pantoprazole 40 MG tablet  Commonly known as:  PROTONIX  Take 1 tablet (40 mg total) by mouth daily at 12 noon.     potassium chloride SA 20 MEQ tablet  Commonly known as:  K-DUR,KLOR-CON  Take 1 tablet (20 mEq total)  by mouth daily.     predniSONE 10 MG tablet  Commonly known as:  DELTASONE  Take 5 tabs X 1 day; then 4 tablets by mouth daily X 2 days; then 2 tablet by mouth X 3 days; then resume daily 10mg  dose.     valsartan 40 MG tablet  Commonly known as:  DIOVAN  Take 40 mg by mouth daily.          Physical Exam  Awake Alert, Oriented X 3, No new F.N deficits, Normal affect Bearden.AT,PERRAL Supple Neck,No JVD, No cervical lymphadenopathy appriciated.  Symmetrical Chest wall movement, Good air movement bilaterally, CTAB RRR,No Gallops,Rubs or new Murmurs, No Parasternal Heave +ve B.Sounds, Abd Soft, No tenderness, No organomegaly appriciated, No rebound - guarding or rigidity. No Cyanosis, Clubbing, trace edema, No new Rash or bruise      Data Review   Micro Results Recent Results (from the past 240 hour(s))  MRSA PCR SCREENING     Status: None   Collection Time    04/17/14  5:46 AM      Result Value Ref Range Status   MRSA by PCR NEGATIVE  NEGATIVE Final   Comment:            The GeneXpert MRSA Assay (FDA     approved for NASAL specimens     only), is one component of a     comprehensive MRSA colonization     surveillance program. It is not     intended to diagnose MRSA     infection nor to guide or     monitor treatment for     MRSA infections.     CBC  Recent Labs Lab 04/20/14 0343  WBC 9.4  HGB 13.3  HCT 40.4  PLT 169  MCV 92.0  MCH 30.3  MCHC 32.9  RDW 16.2*    Chemistries  No results found for this basename: NA, K, CL, CO2, GLUCOSE, BUN, CREATININE, GFRCGP, CALCIUM, MG, AST, ALT, ALKPHOS, BILITOT,  in the last 168 hours ------------------------------------------------------------------------------------------------------------------ estimated creatinine clearance is 62.4 ml/min (by C-G formula based on Cr of 1.32). ------------------------------------------------------------------------------------------------------------------ No results found for this  basename: HGBA1C,  in the last 72 hours ------------------------------------------------------------------------------------------------------------------ No results found for this basename: CHOL, HDL, LDLCALC, TRIG, CHOLHDL, LDLDIRECT,  in the last 72 hours ------------------------------------------------------------------------------------------------------------------ No results found for this basename: TSH, T4TOTAL, FREET3, T3FREE, THYROIDAB,  in the last 72 hours ------------------------------------------------------------------------------------------------------------------ No results found for this basename: VITAMINB12, FOLATE, FERRITIN, TIBC, IRON, RETICCTPCT,  in the last 72 hours  Coagulation profile No results found for this basename:  INR, PROTIME,  in the last 168 hours  No results found for this basename: DDIMER,  in the last 72 hours  Cardiac Enzymes No results found for this basename: CK, CKMB, TROPONINI, MYOGLOBIN,  in the last 168 hours ------------------------------------------------------------------------------------------------------------------ No components found with this basename: POCBNP,    Time Spent in minutes  45     Lala Lund K M.D on 04/26/2014 at 10:06 AM   **Disclaimer: This note may have been dictated with voice recognition software. Similar sounding words can inadvertently be transcribed and this note may contain transcription errors which may not have been corrected upon publication of note.**

## 2014-04-26 NOTE — Patient Instructions (Signed)
Follow with Primary MD Angelica Chessman, MD oral this clinic in 7 days for lab work and weight recheck.   Activity: As tolerated with Full fall precautions use walker/cane & assistance as needed   Disposition Home    Diet: Heart Healthy   Check your Weight same time everyday, if you gain over 2 pounds, or you develop in leg swelling, experience more shortness of breath or chest pain, call your Primary MD immediately. Follow Cardiac Low Salt Diet and 1.2 lit/day fluid restriction.    If you experience worsening of your admission symptoms, develop shortness of breath, life threatening emergency, suicidal or homicidal thoughts you must seek medical attention immediately by calling 911 or calling your MD immediately  if symptoms less severe.   Do not drive, operating heavy machinery, perform activities at heights, swimming or participation in water activities or provide baby sitting services if your were admitted for syncope or siezures until you have seen by Primary MD or a Neurologist and advised to do so again.  Do not drive when taking Pain medications.   Do not take more than prescribed Pain, Sleep and Anxiety Medications  Special Instructions: If you have smoked or chewed Tobacco  in the last 2 yrs please stop smoking, stop any regular Alcohol  and or any Recreational drug use.  Wear Seat belts while driving.

## 2014-05-03 ENCOUNTER — Ambulatory Visit: Payer: PRIVATE HEALTH INSURANCE | Attending: Family Medicine | Admitting: Family Medicine

## 2014-05-03 ENCOUNTER — Encounter: Payer: Self-pay | Admitting: Family Medicine

## 2014-05-03 VITALS — BP 140/68 | HR 72 | Temp 98.1°F | Resp 16 | Ht 68.5 in | Wt 224.0 lb

## 2014-05-03 DIAGNOSIS — J449 Chronic obstructive pulmonary disease, unspecified: Secondary | ICD-10-CM

## 2014-05-03 DIAGNOSIS — G8929 Other chronic pain: Secondary | ICD-10-CM | POA: Diagnosis not present

## 2014-05-03 DIAGNOSIS — Z7952 Long term (current) use of systemic steroids: Secondary | ICD-10-CM | POA: Diagnosis not present

## 2014-05-03 DIAGNOSIS — I509 Heart failure, unspecified: Secondary | ICD-10-CM | POA: Diagnosis present

## 2014-05-03 DIAGNOSIS — Z79891 Long term (current) use of opiate analgesic: Secondary | ICD-10-CM | POA: Insufficient documentation

## 2014-05-03 DIAGNOSIS — Z9981 Dependence on supplemental oxygen: Secondary | ICD-10-CM | POA: Diagnosis not present

## 2014-05-03 DIAGNOSIS — Z6833 Body mass index (BMI) 33.0-33.9, adult: Secondary | ICD-10-CM | POA: Diagnosis not present

## 2014-05-03 DIAGNOSIS — Z87891 Personal history of nicotine dependence: Secondary | ICD-10-CM | POA: Insufficient documentation

## 2014-05-03 DIAGNOSIS — Z85528 Personal history of other malignant neoplasm of kidney: Secondary | ICD-10-CM

## 2014-05-03 DIAGNOSIS — I2510 Atherosclerotic heart disease of native coronary artery without angina pectoris: Secondary | ICD-10-CM | POA: Diagnosis present

## 2014-05-03 DIAGNOSIS — N182 Chronic kidney disease, stage 2 (mild): Secondary | ICD-10-CM | POA: Insufficient documentation

## 2014-05-03 DIAGNOSIS — I5022 Chronic systolic (congestive) heart failure: Secondary | ICD-10-CM | POA: Diagnosis not present

## 2014-05-03 MED ORDER — HYDROMORPHONE HCL 4 MG PO TABS: 4.0000 mg | ORAL_TABLET | Freq: Four times a day (QID) | ORAL | Status: DC | PRN

## 2014-05-03 NOTE — Assessment & Plan Note (Signed)
A: weight stable. BP stable. P: Continue lasix 40 mg BID Check BMP today  F/u with me in 6 week

## 2014-05-03 NOTE — Assessment & Plan Note (Signed)
A:chronic pain mostly in chest. On narcotics P: Refilled dilaudid to prevent withdrawal symptoms Referral to pain management

## 2014-05-03 NOTE — Progress Notes (Signed)
   Subjective:    Patient ID: Lucas [name], male    DOB: August 30, 1945, 68 y.o.   MRN: [id number] CC: f/u CHF, COPD, CAD HPI 68 yo M presents for f/u visit:  1. COPD/CHF: patient on 2 L [place]. He is on chronic daily steroids, prednisone 10 daily. He has intermittent CP and SOB symptoms especially at night. He last had symptoms two nights ago. He denies fever and chills. He has chronic cough that has not worsened acutely. He is established at CHF clinic and with pulmonology. He lives alone. He declines nursing home care. He is amenable to home health.   2. Chronic pain: previously on vicodin for 8 years. Most recently on dilaudid. Amenable to pain management. Currently out of vicodin.   Soc hx: quit smoking, now using e-cigarette  Review of Systems As per HPI     Objective:   Physical Exam BP 140/68  Pulse 72  Temp(Src) 98.1 F (36.7 C) (Oral)  Resp 16  Ht 5' 8.5" (1.74 m)  Wt 224 lb (101.606 kg)  BMI 33.56 kg/m2  SpO2 94% on RA  Wt Readings from Last 3 Encounters:  05/03/14 224 lb (101.606 kg)  04/26/14 224 lb (101.606 kg)  04/20/14 222 lb 1.6 oz (100.744 kg)   BP Readings from Last 3 Encounters:  05/03/14 140/68  04/26/14 152/72  04/20/14 148/76  General appearance: alert, cooperative and no distress Lungs: normal WOB, scattered expiratory wheezing bilaterally  Heart: regular rate and rhythm, S1, S2 normal, no murmur, click, rub or gallop Extremities: edema 1 + b/l        Assessment & Plan:

## 2014-05-03 NOTE — Progress Notes (Signed)
F/U TCC shortness of breath, COPD

## 2014-05-03 NOTE — Assessment & Plan Note (Signed)
A: COPD with intermittent SOB. No decreased O2, worsening cough, resp distress to suggest acute exacerbation P: Continue current regimen of inhaler, supplemental O2, prednisone 10 daily  Patient has pulm f/u in 1 week

## 2014-05-03 NOTE — Patient Instructions (Signed)
Mr. Reesor,  Thank you for coming in today. It was a pleasure meeting you. I look forward to being your primary doctor.   I have placed a referral to pain management and refilled dilaudid until to can establish with pain management. No evidence of acute worsening of COPD or CHF please continue current regimen.  F/u in 6 weeks  Dr. Adrian Blackwater

## 2014-05-04 LAB — BASIC METABOLIC PANEL
BUN: 23 mg/dL (ref 6–23)
CHLORIDE: 104 meq/L (ref 96–112)
CO2: 24 mEq/L (ref 19–32)
Calcium: 9.4 mg/dL (ref 8.4–10.5)
Creat: 1.19 mg/dL (ref 0.50–1.35)
Glucose, Bld: 108 mg/dL — ABNORMAL HIGH (ref 70–99)
POTASSIUM: 3.9 meq/L (ref 3.5–5.3)
Sodium: 141 mEq/L (ref 135–145)

## 2014-05-06 ENCOUNTER — Telehealth: Payer: Self-pay | Admitting: *Deleted

## 2014-05-06 MED ORDER — ALBUTEROL SULFATE (2.5 MG/3ML) 0.083% IN NEBU: 2.5000 mg | INHALATION_SOLUTION | Freq: Four times a day (QID) | RESPIRATORY_TRACT | Status: DC

## 2014-05-06 NOTE — Telephone Encounter (Deleted)
Message copied by Betti Cruz on Mon May 06, 2014  3:40 PM ------      Message from: Boykin Nearing      Created: Mon May 06, 2014  2:09 PM       Normal Cr and potassium, continue current medication regimen ------

## 2014-05-06 NOTE — Telephone Encounter (Signed)
Message copied by Betti Cruz on Mon May 06, 2014  3:42 PM ------      Message from: Boykin Nearing      Created: Mon May 06, 2014  2:09 PM       Normal Cr and potassium, continue current medication regimen ------

## 2014-05-06 NOTE — Telephone Encounter (Signed)
Pt aware of lab results. Pt requested Albuterol Neb refills, will refill x5 per Dr Adrian Blackwater, Rx Albuterol neb e-screen to Pt pharmacy

## 2014-05-08 ENCOUNTER — Telehealth: Payer: Self-pay | Admitting: *Deleted

## 2014-05-08 ENCOUNTER — Other Ambulatory Visit: Payer: Self-pay | Admitting: *Deleted

## 2014-05-08 DIAGNOSIS — I739 Peripheral vascular disease, unspecified: Secondary | ICD-10-CM

## 2014-05-08 MED ORDER — APIXABAN 5 MG PO TABS: 5.0000 mg | ORAL_TABLET | Freq: Two times a day (BID) | ORAL | Status: DC

## 2014-05-08 NOTE — Telephone Encounter (Signed)
Patient called saying that he has been unable to get his Eliquis refilled; he has been off x 2 days now. He says that he has changed PCP and they did not want to refill this. Patient is scheduled to see Dr. Trula Slade on 05-27-14 for followup of his PVD and claudication. I called [facility] and refilled this Eliquis x 1 refill;  His insurance is already approved per pharmacist. I discussed with the patient that future refills would have to come from cardiology or his PCP because of the need for bloodwork monitoring. He voiced understanding and will go to the pharmacy now to start his medication asap.

## 2014-05-13 ENCOUNTER — Inpatient Hospital Stay (HOSPITAL_COMMUNITY)
Admission: EM | Admit: 2014-05-13 | Discharge: 2014-05-17 | DRG: 291 | Disposition: A | Discharge status: Home / Self Care | Payer: PRIVATE HEALTH INSURANCE | Attending: Internal Medicine | Admitting: Internal Medicine

## 2014-05-13 ENCOUNTER — Emergency Department (HOSPITAL_COMMUNITY): Payer: PRIVATE HEALTH INSURANCE

## 2014-05-13 ENCOUNTER — Encounter (HOSPITAL_COMMUNITY): Payer: Self-pay | Admitting: Emergency Medicine

## 2014-05-13 DIAGNOSIS — N400 Enlarged prostate without lower urinary tract symptoms: Secondary | ICD-10-CM | POA: Diagnosis present

## 2014-05-13 DIAGNOSIS — K219 Gastro-esophageal reflux disease without esophagitis: Secondary | ICD-10-CM | POA: Diagnosis present

## 2014-05-13 DIAGNOSIS — Z7982 Long term (current) use of aspirin: Secondary | ICD-10-CM

## 2014-05-13 DIAGNOSIS — C61 Malignant neoplasm of prostate: Secondary | ICD-10-CM | POA: Diagnosis present

## 2014-05-13 DIAGNOSIS — Z85528 Personal history of other malignant neoplasm of kidney: Secondary | ICD-10-CM

## 2014-05-13 DIAGNOSIS — E785 Hyperlipidemia, unspecified: Secondary | ICD-10-CM | POA: Diagnosis present

## 2014-05-13 DIAGNOSIS — I480 Paroxysmal atrial fibrillation: Secondary | ICD-10-CM | POA: Diagnosis present

## 2014-05-13 DIAGNOSIS — Z955 Presence of coronary angioplasty implant and graft: Secondary | ICD-10-CM

## 2014-05-13 DIAGNOSIS — N183 Chronic kidney disease, stage 3 (moderate): Secondary | ICD-10-CM | POA: Diagnosis present

## 2014-05-13 DIAGNOSIS — E119 Type 2 diabetes mellitus without complications: Secondary | ICD-10-CM | POA: Diagnosis present

## 2014-05-13 DIAGNOSIS — I5021 Acute systolic (congestive) heart failure: Secondary | ICD-10-CM

## 2014-05-13 DIAGNOSIS — J9621 Acute and chronic respiratory failure with hypoxia: Secondary | ICD-10-CM | POA: Diagnosis present

## 2014-05-13 DIAGNOSIS — Z87891 Personal history of nicotine dependence: Secondary | ICD-10-CM

## 2014-05-13 DIAGNOSIS — I708 Atherosclerosis of other arteries: Secondary | ICD-10-CM | POA: Diagnosis present

## 2014-05-13 DIAGNOSIS — Z8546 Personal history of malignant neoplasm of prostate: Secondary | ICD-10-CM

## 2014-05-13 DIAGNOSIS — I509 Heart failure, unspecified: Secondary | ICD-10-CM

## 2014-05-13 DIAGNOSIS — I739 Peripheral vascular disease, unspecified: Secondary | ICD-10-CM | POA: Diagnosis present

## 2014-05-13 DIAGNOSIS — I25810 Atherosclerosis of coronary artery bypass graft(s) without angina pectoris: Secondary | ICD-10-CM | POA: Diagnosis present

## 2014-05-13 DIAGNOSIS — R0789 Other chest pain: Secondary | ICD-10-CM

## 2014-05-13 DIAGNOSIS — Z9981 Dependence on supplemental oxygen: Secondary | ICD-10-CM

## 2014-05-13 DIAGNOSIS — F419 Anxiety disorder, unspecified: Secondary | ICD-10-CM | POA: Diagnosis present

## 2014-05-13 DIAGNOSIS — N182 Chronic kidney disease, stage 2 (mild): Secondary | ICD-10-CM | POA: Diagnosis present

## 2014-05-13 DIAGNOSIS — Z885 Allergy status to narcotic agent status: Secondary | ICD-10-CM

## 2014-05-13 DIAGNOSIS — I5022 Chronic systolic (congestive) heart failure: Secondary | ICD-10-CM | POA: Diagnosis present

## 2014-05-13 DIAGNOSIS — I129 Hypertensive chronic kidney disease with stage 1 through stage 4 chronic kidney disease, or unspecified chronic kidney disease: Secondary | ICD-10-CM | POA: Diagnosis present

## 2014-05-13 DIAGNOSIS — Z951 Presence of aortocoronary bypass graft: Secondary | ICD-10-CM

## 2014-05-13 DIAGNOSIS — Z905 Acquired absence of kidney: Secondary | ICD-10-CM | POA: Diagnosis present

## 2014-05-13 DIAGNOSIS — Z7901 Long term (current) use of anticoagulants: Secondary | ICD-10-CM

## 2014-05-13 DIAGNOSIS — I5033 Acute on chronic diastolic (congestive) heart failure: Principal | ICD-10-CM | POA: Diagnosis present

## 2014-05-13 DIAGNOSIS — J441 Chronic obstructive pulmonary disease with (acute) exacerbation: Secondary | ICD-10-CM | POA: Diagnosis present

## 2014-05-13 DIAGNOSIS — G8929 Other chronic pain: Secondary | ICD-10-CM

## 2014-05-13 DIAGNOSIS — E669 Obesity, unspecified: Secondary | ICD-10-CM | POA: Diagnosis present

## 2014-05-13 DIAGNOSIS — I10 Essential (primary) hypertension: Secondary | ICD-10-CM | POA: Diagnosis present

## 2014-05-13 DIAGNOSIS — N179 Acute kidney failure, unspecified: Secondary | ICD-10-CM | POA: Diagnosis present

## 2014-05-13 DIAGNOSIS — Z6833 Body mass index (BMI) 33.0-33.9, adult: Secondary | ICD-10-CM

## 2014-05-13 DIAGNOSIS — N189 Chronic kidney disease, unspecified: Secondary | ICD-10-CM

## 2014-05-13 DIAGNOSIS — R079 Chest pain, unspecified: Secondary | ICD-10-CM

## 2014-05-13 DIAGNOSIS — G4733 Obstructive sleep apnea (adult) (pediatric): Secondary | ICD-10-CM | POA: Diagnosis present

## 2014-05-13 DIAGNOSIS — I2510 Atherosclerotic heart disease of native coronary artery without angina pectoris: Secondary | ICD-10-CM | POA: Diagnosis present

## 2014-05-13 LAB — PRO B NATRIURETIC PEPTIDE: Pro B Natriuretic peptide (BNP): 697.9 pg/mL — ABNORMAL HIGH (ref 0–125)

## 2014-05-13 LAB — CBC WITH DIFFERENTIAL/PLATELET
Basophils Absolute: 0 10*3/uL (ref 0.0–0.1)
Basophils Relative: 0 % (ref 0–1)
Eosinophils Absolute: 0.1 10*3/uL (ref 0.0–0.7)
Eosinophils Relative: 1 % (ref 0–5)
HCT: 40.5 % (ref 39.0–52.0)
Hemoglobin: 13.4 g/dL (ref 13.0–17.0)
Lymphocytes Relative: 16 % (ref 12–46)
Lymphs Abs: 1.2 10*3/uL (ref 0.7–4.0)
MCH: 30.7 pg (ref 26.0–34.0)
MCHC: 33.1 g/dL (ref 30.0–36.0)
MCV: 92.7 fL (ref 78.0–100.0)
Monocytes Absolute: 0.7 10*3/uL (ref 0.1–1.0)
Monocytes Relative: 10 % (ref 3–12)
Neutro Abs: 5.2 10*3/uL (ref 1.7–7.7)
Neutrophils Relative %: 73 % (ref 43–77)
Platelets: 186 10*3/uL (ref 150–400)
RBC: 4.37 MIL/uL (ref 4.22–5.81)
RDW: 15.8 % — ABNORMAL HIGH (ref 11.5–15.5)
WBC: 7.2 10*3/uL (ref 4.0–10.5)

## 2014-05-13 LAB — BASIC METABOLIC PANEL
Anion gap: 19 — ABNORMAL HIGH (ref 5–15)
BUN: 22 mg/dL (ref 6–23)
CO2: 24 mEq/L (ref 19–32)
Calcium: 10 mg/dL (ref 8.4–10.5)
Chloride: 102 mEq/L (ref 96–112)
Creatinine, Ser: 1.32 mg/dL (ref 0.50–1.35)
GFR calc Af Amer: 62 mL/min — ABNORMAL LOW (ref >90)
GFR calc non Af Amer: 54 mL/min — ABNORMAL LOW (ref >90)
Glucose, Bld: 105 mg/dL — ABNORMAL HIGH (ref 70–99)
Potassium: 4.1 mEq/L (ref 3.7–5.3)
Sodium: 145 mEq/L (ref 137–147)

## 2014-05-13 LAB — BLOOD GAS, VENOUS
Acid-Base Excess: 0.9 mmol/L (ref 0.0–2.0)
Bicarbonate: 24.4 mEq/L — ABNORMAL HIGH (ref 20.0–24.0)
Drawn by: 235321
FIO2: 0.21 %
O2 Saturation: 75.4 %
Patient temperature: 37
TCO2: 21.5 mmol/L (ref 0–100)
pCO2, Ven: 36.7 mmHg — ABNORMAL LOW (ref 45.0–50.0)
pH, Ven: 7.437 — ABNORMAL HIGH (ref 7.250–7.300)

## 2014-05-13 LAB — TROPONIN I: Troponin I: <0.3 ng/mL (ref <0.30)

## 2014-05-13 MED ORDER — ALBUTEROL (5 MG/ML) CONTINUOUS INHALATION SOLN
10.0000 mg/h | INHALATION_SOLUTION | RESPIRATORY_TRACT | Status: AC
  Administered 2014-05-13: 10 mg/h via RESPIRATORY_TRACT
  Filled 2014-05-13: qty 20

## 2014-05-13 MED ORDER — IPRATROPIUM BROMIDE 0.02 % IN SOLN
0.5000 mg | RESPIRATORY_TRACT | Status: AC
  Administered 2014-05-13: 0.5 mg via RESPIRATORY_TRACT
  Filled 2014-05-13: qty 2.5

## 2014-05-13 MED ORDER — FENTANYL CITRATE 0.05 MG/ML IJ SOLN
50.0000 ug | Freq: Once | INTRAMUSCULAR | Status: AC
  Administered 2014-05-13: 50 ug via INTRAVENOUS
  Filled 2014-05-13: qty 2

## 2014-05-13 MED ORDER — NITROGLYCERIN 0.4 MG SL SUBL: 0.4000 mg | SUBLINGUAL_TABLET | SUBLINGUAL | Status: DC | PRN

## 2014-05-13 NOTE — ED Notes (Signed)
Brought in by EMS from home with c/o shortness of breath.  Pt also reports central chest pain/pressure and "abdominal distention".  Pt was given neb treatment of Albuterol 10 mg and Atrovent 0.5 mg en route to ED.  Per pt, he took Nitroglycerin x 2 doses prior to EMS' arrival at his home, but chest pressure was not relieved.

## 2014-05-13 NOTE — ED Notes (Addendum)
Patient given decaf coffee, ok by PA

## 2014-05-13 NOTE — ED Notes (Signed)
Bed: GZ35 Expected date:  Expected time:  Means of arrival:  Comments: EMS [facility], chest pain, fluid retention

## 2014-05-14 ENCOUNTER — Encounter (HOSPITAL_COMMUNITY): Payer: Self-pay | Admitting: *Deleted

## 2014-05-14 ENCOUNTER — Inpatient Hospital Stay (HOSPITAL_COMMUNITY): Payer: PRIVATE HEALTH INSURANCE

## 2014-05-14 DIAGNOSIS — I2510 Atherosclerotic heart disease of native coronary artery without angina pectoris: Secondary | ICD-10-CM | POA: Diagnosis present

## 2014-05-14 DIAGNOSIS — N179 Acute kidney failure, unspecified: Secondary | ICD-10-CM | POA: Diagnosis present

## 2014-05-14 DIAGNOSIS — E785 Hyperlipidemia, unspecified: Secondary | ICD-10-CM | POA: Diagnosis present

## 2014-05-14 DIAGNOSIS — I708 Atherosclerosis of other arteries: Secondary | ICD-10-CM | POA: Diagnosis present

## 2014-05-14 DIAGNOSIS — I359 Nonrheumatic aortic valve disorder, unspecified: Secondary | ICD-10-CM

## 2014-05-14 DIAGNOSIS — Z955 Presence of coronary angioplasty implant and graft: Secondary | ICD-10-CM | POA: Diagnosis not present

## 2014-05-14 DIAGNOSIS — F419 Anxiety disorder, unspecified: Secondary | ICD-10-CM | POA: Diagnosis present

## 2014-05-14 DIAGNOSIS — I5022 Chronic systolic (congestive) heart failure: Secondary | ICD-10-CM | POA: Diagnosis present

## 2014-05-14 DIAGNOSIS — I5033 Acute on chronic diastolic (congestive) heart failure: Secondary | ICD-10-CM | POA: Diagnosis present

## 2014-05-14 DIAGNOSIS — G4733 Obstructive sleep apnea (adult) (pediatric): Secondary | ICD-10-CM | POA: Diagnosis present

## 2014-05-14 DIAGNOSIS — Z951 Presence of aortocoronary bypass graft: Secondary | ICD-10-CM | POA: Diagnosis not present

## 2014-05-14 DIAGNOSIS — E669 Obesity, unspecified: Secondary | ICD-10-CM | POA: Diagnosis present

## 2014-05-14 DIAGNOSIS — Z905 Acquired absence of kidney: Secondary | ICD-10-CM | POA: Diagnosis present

## 2014-05-14 DIAGNOSIS — I480 Paroxysmal atrial fibrillation: Secondary | ICD-10-CM | POA: Diagnosis present

## 2014-05-14 DIAGNOSIS — Z885 Allergy status to narcotic agent status: Secondary | ICD-10-CM | POA: Diagnosis not present

## 2014-05-14 DIAGNOSIS — E119 Type 2 diabetes mellitus without complications: Secondary | ICD-10-CM | POA: Diagnosis present

## 2014-05-14 DIAGNOSIS — Z85528 Personal history of other malignant neoplasm of kidney: Secondary | ICD-10-CM | POA: Diagnosis not present

## 2014-05-14 DIAGNOSIS — I509 Heart failure, unspecified: Secondary | ICD-10-CM

## 2014-05-14 DIAGNOSIS — I129 Hypertensive chronic kidney disease with stage 1 through stage 4 chronic kidney disease, or unspecified chronic kidney disease: Secondary | ICD-10-CM | POA: Diagnosis present

## 2014-05-14 DIAGNOSIS — Z7901 Long term (current) use of anticoagulants: Secondary | ICD-10-CM | POA: Diagnosis not present

## 2014-05-14 DIAGNOSIS — J441 Chronic obstructive pulmonary disease with (acute) exacerbation: Secondary | ICD-10-CM | POA: Diagnosis present

## 2014-05-14 DIAGNOSIS — Z87891 Personal history of nicotine dependence: Secondary | ICD-10-CM | POA: Diagnosis not present

## 2014-05-14 DIAGNOSIS — I739 Peripheral vascular disease, unspecified: Secondary | ICD-10-CM | POA: Diagnosis present

## 2014-05-14 DIAGNOSIS — I5020 Unspecified systolic (congestive) heart failure: Secondary | ICD-10-CM

## 2014-05-14 DIAGNOSIS — Z8546 Personal history of malignant neoplasm of prostate: Secondary | ICD-10-CM | POA: Diagnosis not present

## 2014-05-14 DIAGNOSIS — Z9981 Dependence on supplemental oxygen: Secondary | ICD-10-CM | POA: Diagnosis not present

## 2014-05-14 DIAGNOSIS — Z7982 Long term (current) use of aspirin: Secondary | ICD-10-CM | POA: Diagnosis not present

## 2014-05-14 DIAGNOSIS — N400 Enlarged prostate without lower urinary tract symptoms: Secondary | ICD-10-CM | POA: Diagnosis present

## 2014-05-14 DIAGNOSIS — J9621 Acute and chronic respiratory failure with hypoxia: Secondary | ICD-10-CM | POA: Diagnosis present

## 2014-05-14 DIAGNOSIS — G8929 Other chronic pain: Secondary | ICD-10-CM | POA: Diagnosis present

## 2014-05-14 DIAGNOSIS — Z6833 Body mass index (BMI) 33.0-33.9, adult: Secondary | ICD-10-CM | POA: Diagnosis not present

## 2014-05-14 DIAGNOSIS — I25810 Atherosclerosis of coronary artery bypass graft(s) without angina pectoris: Secondary | ICD-10-CM | POA: Diagnosis present

## 2014-05-14 DIAGNOSIS — N183 Chronic kidney disease, stage 3 (moderate): Secondary | ICD-10-CM | POA: Diagnosis present

## 2014-05-14 DIAGNOSIS — K219 Gastro-esophageal reflux disease without esophagitis: Secondary | ICD-10-CM | POA: Diagnosis present

## 2014-05-14 LAB — EXPECTORATED SPUTUM ASSESSMENT W REFEX TO RESP CULTURE

## 2014-05-14 LAB — CBC
HEMATOCRIT: 37.8 % — AB (ref 39.0–52.0)
HEMOGLOBIN: 12.6 g/dL — AB (ref 13.0–17.0)
MCH: 30.9 pg (ref 26.0–34.0)
MCHC: 33.3 g/dL (ref 30.0–36.0)
MCV: 92.6 fL (ref 78.0–100.0)
Platelets: 176 10*3/uL (ref 150–400)
RBC: 4.08 MIL/uL — ABNORMAL LOW (ref 4.22–5.81)
RDW: 16 % — ABNORMAL HIGH (ref 11.5–15.5)
WBC: 7.9 10*3/uL (ref 4.0–10.5)

## 2014-05-14 LAB — COMPREHENSIVE METABOLIC PANEL
ALBUMIN: 3.9 g/dL (ref 3.5–5.2)
ALK PHOS: 56 U/L (ref 39–117)
ALT: 19 U/L (ref 0–53)
AST: 18 U/L (ref 0–37)
Anion gap: 16 — ABNORMAL HIGH (ref 5–15)
BILIRUBIN TOTAL: 0.3 mg/dL (ref 0.3–1.2)
BUN: 26 mg/dL — ABNORMAL HIGH (ref 6–23)
CHLORIDE: 99 meq/L (ref 96–112)
CO2: 24 mEq/L (ref 19–32)
Calcium: 9.7 mg/dL (ref 8.4–10.5)
Creatinine, Ser: 1.3 mg/dL (ref 0.50–1.35)
GFR calc Af Amer: 64 mL/min — ABNORMAL LOW (ref >90)
GFR calc non Af Amer: 55 mL/min — ABNORMAL LOW (ref >90)
Glucose, Bld: 110 mg/dL — ABNORMAL HIGH (ref 70–99)
POTASSIUM: 3.3 meq/L — AB (ref 3.7–5.3)
Sodium: 139 mEq/L (ref 137–147)
Total Protein: 7.1 g/dL (ref 6.0–8.3)

## 2014-05-14 LAB — TROPONIN I
Troponin I: <0.3 ng/mL (ref <0.30)
Troponin I: <0.3 ng/mL (ref <0.30)
Troponin I: <0.3 ng/mL (ref <0.30)

## 2014-05-14 LAB — EXPECTORATED SPUTUM ASSESSMENT W GRAM STAIN, RFLX TO RESP C

## 2014-05-14 LAB — MAGNESIUM: Magnesium: 2.1 mg/dL (ref 1.5–2.5)

## 2014-05-14 MED ORDER — SODIUM CHLORIDE 0.9 % IV SOLN: 250.0000 mL | INTRAVENOUS | Status: DC | PRN

## 2014-05-14 MED ORDER — CETYLPYRIDINIUM CHLORIDE 0.05 % MT LIQD
7.0000 mL | Freq: Two times a day (BID) | OROMUCOSAL | Status: DC
  Administered 2014-05-14 – 2014-05-17 (×7): 7 mL via OROMUCOSAL

## 2014-05-14 MED ORDER — AZITHROMYCIN 500 MG PO TABS
500.0000 mg | ORAL_TABLET | Freq: Every day | ORAL | Status: AC
Start: 2014-05-14 — End: 2014-05-14
  Administered 2014-05-14: 500 mg via ORAL
  Filled 2014-05-14 (×2): qty 1

## 2014-05-14 MED ORDER — HYDROCODONE-ACETAMINOPHEN 10-325 MG PO TABS
1.0000 | ORAL_TABLET | Freq: Every day | ORAL | Status: DC
  Administered 2014-05-14 – 2014-05-16 (×10): 1 via ORAL
  Filled 2014-05-14 (×12): qty 1

## 2014-05-14 MED ORDER — DOXAZOSIN MESYLATE 8 MG PO TABS
8.0000 mg | ORAL_TABLET | Freq: Every day | ORAL | Status: DC
  Administered 2014-05-14 – 2014-05-16 (×3): 8 mg via ORAL
  Filled 2014-05-14 (×3): qty 1

## 2014-05-14 MED ORDER — ALBUTEROL SULFATE (2.5 MG/3ML) 0.083% IN NEBU: 2.5000 mg | INHALATION_SOLUTION | RESPIRATORY_TRACT | Status: DC | PRN

## 2014-05-14 MED ORDER — AZITHROMYCIN 250 MG PO TABS
250.0000 mg | ORAL_TABLET | Freq: Every day | ORAL | Status: DC
  Administered 2014-05-15 – 2014-05-17 (×3): 250 mg via ORAL
  Filled 2014-05-14 (×3): qty 1

## 2014-05-14 MED ORDER — POTASSIUM CHLORIDE CRYS ER 20 MEQ PO TBCR
40.0000 meq | EXTENDED_RELEASE_TABLET | Freq: Once | ORAL | Status: AC
  Administered 2014-05-14: 40 meq via ORAL
  Filled 2014-05-14: qty 2

## 2014-05-14 MED ORDER — IRBESARTAN 75 MG PO TABS
37.5000 mg | ORAL_TABLET | Freq: Every day | ORAL | Status: DC
  Administered 2014-05-14 – 2014-05-17 (×4): 37.5 mg via ORAL
  Filled 2014-05-14 (×4): qty 0.5

## 2014-05-14 MED ORDER — IPRATROPIUM-ALBUTEROL 0.5-2.5 (3) MG/3ML IN SOLN: 3.0000 mL | RESPIRATORY_TRACT | Status: DC

## 2014-05-14 MED ORDER — NICOTINE 21 MG/24HR TD PT24
21.0000 mg | MEDICATED_PATCH | Freq: Every day | TRANSDERMAL | Status: DC
  Administered 2014-05-15 – 2014-05-17 (×5): 21 mg via TRANSDERMAL
  Filled 2014-05-14 (×5): qty 1

## 2014-05-14 MED ORDER — HYDROMORPHONE HCL 2 MG PO TABS
4.0000 mg | ORAL_TABLET | Freq: Four times a day (QID) | ORAL | Status: DC | PRN
  Administered 2014-05-14 – 2014-05-16 (×7): 4 mg via ORAL
  Filled 2014-05-14 (×9): qty 2

## 2014-05-14 MED ORDER — APIXABAN 5 MG PO TABS
5.0000 mg | ORAL_TABLET | Freq: Two times a day (BID) | ORAL | Status: DC
  Administered 2014-05-14 – 2014-05-17 (×7): 5 mg via ORAL
  Filled 2014-05-14 (×7): qty 1

## 2014-05-14 MED ORDER — OXYCODONE HCL 5 MG PO TABS
5.0000 mg | ORAL_TABLET | ORAL | Status: DC | PRN
  Administered 2014-05-14 – 2014-05-17 (×16): 10 mg via ORAL
  Filled 2014-05-14 (×17): qty 2

## 2014-05-14 MED ORDER — ASPIRIN EC 81 MG PO TBEC
81.0000 mg | DELAYED_RELEASE_TABLET | Freq: Every day | ORAL | Status: DC
  Administered 2014-05-14 – 2014-05-17 (×4): 81 mg via ORAL
  Filled 2014-05-14 (×4): qty 1

## 2014-05-14 MED ORDER — FUROSEMIDE 10 MG/ML IJ SOLN
80.0000 mg | Freq: Once | INTRAMUSCULAR | Status: AC
  Administered 2014-05-14: 80 mg via INTRAVENOUS
  Filled 2014-05-14: qty 8

## 2014-05-14 MED ORDER — ATORVASTATIN CALCIUM 40 MG PO TABS
40.0000 mg | ORAL_TABLET | Freq: Every day | ORAL | Status: DC
  Filled 2014-05-14 (×4): qty 1

## 2014-05-14 MED ORDER — GUAIFENESIN-DM 100-10 MG/5ML PO SYRP
5.0000 mL | ORAL_SOLUTION | ORAL | Status: DC | PRN
  Administered 2014-05-14 – 2014-05-16 (×6): 5 mL via ORAL
  Filled 2014-05-14 (×6): qty 10

## 2014-05-14 MED ORDER — SODIUM CHLORIDE 0.9 % IJ SOLN
3.0000 mL | Freq: Two times a day (BID) | INTRAMUSCULAR | Status: DC
  Administered 2014-05-16 (×2): 3 mL via INTRAVENOUS

## 2014-05-14 MED ORDER — ISOSORBIDE MONONITRATE ER 30 MG PO TB24
30.0000 mg | ORAL_TABLET | Freq: Every day | ORAL | Status: DC
  Filled 2014-05-14 (×2): qty 1

## 2014-05-14 MED ORDER — PANTOPRAZOLE SODIUM 40 MG PO TBEC
40.0000 mg | DELAYED_RELEASE_TABLET | Freq: Every day | ORAL | Status: DC
  Administered 2014-05-14 – 2014-05-15 (×2): 40 mg via ORAL
  Filled 2014-05-14 (×4): qty 1

## 2014-05-14 MED ORDER — IOHEXOL 350 MG/ML SOLN
100.0000 mL | Freq: Once | INTRAVENOUS | Status: AC | PRN
  Administered 2014-05-14: 100 mL via INTRAVENOUS

## 2014-05-14 MED ORDER — ALPRAZOLAM 1 MG PO TABS
1.0000 mg | ORAL_TABLET | Freq: Three times a day (TID) | ORAL | Status: DC | PRN
  Administered 2014-05-14 – 2014-05-17 (×9): 1 mg via ORAL
  Filled 2014-05-14 (×2): qty 1
  Filled 2014-05-14: qty 2
  Filled 2014-05-14 (×3): qty 1
  Filled 2014-05-14 (×2): qty 2
  Filled 2014-05-14 (×2): qty 1

## 2014-05-14 MED ORDER — SODIUM CHLORIDE 0.9 % IJ SOLN: 3.0000 mL | INTRAMUSCULAR | Status: DC | PRN

## 2014-05-14 MED ORDER — ALBUTEROL SULFATE (2.5 MG/3ML) 0.083% IN NEBU
2.5000 mg | INHALATION_SOLUTION | RESPIRATORY_TRACT | Status: DC | PRN
Start: 2014-05-14 — End: 2014-05-17
  Administered 2014-05-16: 2.5 mg via RESPIRATORY_TRACT
  Filled 2014-05-14: qty 3

## 2014-05-14 MED ORDER — METHYLPREDNISOLONE SODIUM SUCC 125 MG IJ SOLR
60.0000 mg | Freq: Four times a day (QID) | INTRAMUSCULAR | Status: DC
  Administered 2014-05-14 – 2014-05-16 (×9): 60 mg via INTRAVENOUS
  Filled 2014-05-14 (×11): qty 0.96

## 2014-05-14 MED ORDER — FUROSEMIDE 10 MG/ML IJ SOLN
80.0000 mg | Freq: Two times a day (BID) | INTRAMUSCULAR | Status: DC
  Administered 2014-05-15: 80 mg via INTRAVENOUS
  Filled 2014-05-14: qty 8

## 2014-05-14 MED ORDER — HEPARIN SODIUM (PORCINE) 5000 UNIT/ML IJ SOLN: 5000.0000 [IU] | Freq: Three times a day (TID) | INTRAMUSCULAR | Status: DC

## 2014-05-14 MED ORDER — IPRATROPIUM-ALBUTEROL 0.5-2.5 (3) MG/3ML IN SOLN
3.0000 mL | Freq: Four times a day (QID) | RESPIRATORY_TRACT | Status: DC
  Administered 2014-05-14 – 2014-05-16 (×10): 3 mL via RESPIRATORY_TRACT
  Filled 2014-05-14 (×12): qty 3

## 2014-05-14 MED ORDER — SODIUM CHLORIDE 0.9 % IJ SOLN
3.0000 mL | Freq: Two times a day (BID) | INTRAMUSCULAR | Status: DC
  Administered 2014-05-14 – 2014-05-15 (×5): 3 mL via INTRAVENOUS

## 2014-05-14 NOTE — ED Notes (Signed)
Dr.Knapp at bedside  

## 2014-05-14 NOTE — H&P (Signed)
[facility] History and Physical  Lucas [name] QTM:[id number] DOB: [birth year] DOA: 05/13/2014  Referring physician: ED physician PCP: Lucas Ends, MD  Specialists:   Chief Complaint: chest pain, shortness of breath, coughing  HPI: Lucas [name] is a 68 y.o. male with past medical history of CHF, COPD, CAD, hypertension, CKD-II, PVD, A. Fib, who presents with chest pain, SOB and cough.   Patient reports that he started having chest pain 5 days ago. The chest pain is located at left lower chest, intermittent, 8 out of 10 in severity when it comes. It happens approximately 6 times per day, lasting for about 1 hour each time. His chest pain is pleuritic in nature. It is aggravated by deep breath and coughing. Patient does not have a fever, chills. He has some mild nausea, but no diarrhea or abdominal pain. He reports that he has been generally compliant to his medications, but admitted that he missed his Eliquis for 2 days on 10/28 and 10/29. He states that he resumed it on 05/10/14. Patient reports having productive cough with yellow colored sputum production. He denies recent viral infection or cold symptoms, such as runny nose and sore throat. Of note, patient reports that his dry weight is about 206 pounds, and his body weight increased by more than 10 LBs recently. He states that he has been compliant to his Lasix, 40 mg twice a day.  Patient was found to have a negative troponin, proBNP 697.9, no leukocytosis. Chest x-ray showed COPD without acute abnormalities.  Review of Systems: As presented in the history of presenting illness, rest negative.  Where does patient live?  Lives at home Can patient participate in ADLs? Yes  Allergy:  Allergies  Allergen Reactions  . Ativan [Lorazepam] Other (See Comments)    Increases agitation, tolerates xanax  . Lisinopril Cough  . Morphine And Related Other (See Comments)    Hallucinations, too sedated when given with ativan  .  Ibuprofen Hives, Nausea And Vomiting and Rash    Tolerates baby aspirin per patient.  There is no allergy to any dose of aspirin. Pt refuses to take 325mg  because it caused nose bleeds.    Past Medical History  Diagnosis Date  . CAD (coronary artery disease)     a. s/p CABG x 5;  b. 04/2012 Cath: patent LIMA->LAD and VG->OM3, all other grafts occluded. c. Cath 12/2013: patent SVG-OM3 & LIMA-LAD, known occ other VG.  Marland Kitchen Hypertension   . Adjustment disorder with anxiety   . Hyperlipidemia     a. Unable to take statins.  Marland Kitchen BPH (benign prostatic hypertrophy)   . COPD (chronic obstructive pulmonary disease)     a. on home O2.  . Arthritis   . Ischemic cardiomyopathy     a. 06/2013 Echo: EF 30-35%, no reg wma's, Gr2 DD, triv AI, mod dil LA, nl RV fxn.  . Chronic systolic CHF (congestive heart failure)     a. 06/2013 Echo: EF 30-35%. b. 11/08/13 EF 40-45%, LVH, HK of the mid-distalanterseptal myocardium, trivial Ao regurg, calcified mitral annulus, RA mildly dilated  . Prostate cancer dx'd 2012  . Renal cancer dx'd 1997    lt nephrectomy  . CKD (chronic kidney disease), stage III   . Tobacco abuse     a. 101 yr hx as of 2015, transitioned to e-cig.  Marland Kitchen Syncope     a. 08/2013: the day following cath, no injury, had received multiple doses of Versed for anxiety - this was  felt to be a contributing factor.  Marland Kitchen PAF (paroxysmal atrial fibrillation)     a. on Eliquis 5 mg bid  . Hypotension     a. H/o soft BP prohibiting ACEI/ARB use.  . Chronic pain   . PAD (peripheral artery disease)     a. 12/2013: PV angio s/p angioplasty to R external iliac artery stenosis, also has R SFA occlusion with reconstitution of above-knee popliteal artery, 2 vessel runoff via peroneal and posterior tib.b. LE doppler 02/2014 ABI right 0.52, left 0.95  . Pulmonary nodules     a. 12/2013:  small pulmonary nodules measuring up to 60mm in RM/LL, f/u recommended 6-12 months.  . Anxiety Dx 2004    Past Surgical History   Procedure Laterality Date  . Coronary artery bypass graft      x 5  . Cardiac catheterization    . Nephrectomy      left nephrectomy for ca  . Posterior cervical laminectomy      x 8   limited ROM  and can't lie flat  . Heart stents      x 5  . Robot assisted laparoscopic radical prostatectomy      for prostate cancer  . Cystoscopy with litholapaxy  05/01/2012    Procedure: CYSTOSCOPY WITH LITHOLAPAXY;  Surgeon: Lucas Gray, MD;  Location: [facility];  Service: Urology;  Laterality: N/A;  . Prostate cancer    . Kidney surgery Left 1994    Social History:  reports that he quit smoking about 3 months ago. His smoking use included Cigarettes. He has a 16.2 pack-year smoking history. He quit smokeless tobacco use about 2 years ago. He reports that he does not drink alcohol or use illicit drugs.  Family History:  Family History  Problem Relation Age of Onset  . Diabetes Mother   . Heart disease Mother   . Hypertension Mother   . Heart attack Mother   . Coronary artery disease    . Heart disease Father   . Diabetes Father   . Heart attack Father   . Heart disease Brother   . Heart attack Brother   . COPD Sister      Prior to Admission medications   Medication Sig Start Date End Date Taking? Authorizing Provider  albuterol (PROVENTIL HFA;VENTOLIN HFA) 108 (90 BASE) MCG/ACT inhaler Inhale 2 puffs into the lungs every 6 (six) hours as needed for wheezing or shortness of breath. Shortness of breath 02/27/14  Yes Lucas Males, MD  albuterol (PROVENTIL) (2.5 MG/3ML) 0.083% nebulizer solution Take 3 mLs (2.5 mg total) by nebulization 4 (four) times daily. 05/06/14  Yes Lucas C Funches, MD  ALPRAZolam (XANAX) 1 MG tablet Take 1 tablet (1 mg total) by mouth 3 (three) times daily as needed for anxiety. 11/09/13  Yes Lucas Blaze, MD  apixaban (ELIQUIS) 5 MG TABS tablet Take 1 tablet (5 mg total) by mouth 2 (two) times daily. 05/08/14  Yes Lucas Mitchell, MD  aspirin EC 81 MG tablet Take  81 mg by mouth daily.   Yes Historical Provider, MD  doxazosin (CARDURA) 8 MG tablet Take 8 mg by mouth at bedtime.    Yes Historical Provider, MD  furosemide (LASIX) 20 MG tablet Take 40 mg by mouth 2 (two) times daily.  04/26/14  Yes Thurnell Lose, MD  HYDROcodone-acetaminophen (NORCO) 10-325 MG per tablet Take 1 tablet by mouth 5 (five) times daily. 04/21/14  Yes Historical Provider, MD  HYDROmorphone (DILAUDID) 4 MG tablet  Take 1 tablet (4 mg total) by mouth every 6 (six) hours as needed for severe pain. 05/03/14  Yes Lucas C Funches, MD  ipratropium (ATROVENT) 0.02 % nebulizer solution Take 0.5 mg by nebulization 4 (four) times daily.   Yes Historical Provider, MD  Ipratropium-Albuterol (COMBIVENT RESPIMAT) 20-100 MCG/ACT AERS respimat Inhale 1 puff into the lungs 4 (four) times daily. 04/02/14  Yes Lucas Males, MD  mometasone-formoterol (DULERA) 100-5 MCG/ACT AERO Inhale 2 puffs into the lungs 2 (two) times daily. 11/02/13  Yes Domenic Polite, MD  nitroGLYCERIN (NITROSTAT) 0.3 MG SL tablet Place 0.3 mg under the tongue every 5 (five) minutes as needed for chest pain.   Yes Historical Provider, MD  predniSONE (DELTASONE) 10 MG tablet Take 10 mg by mouth daily with breakfast.   Yes Historical Provider, MD  valsartan (DIOVAN) 40 MG tablet Take 40 mg by mouth daily.  02/13/14  Yes Historical Provider, MD  isosorbide mononitrate (IMDUR) 30 MG 24 hr tablet Take 1 tablet (30 mg total) by mouth daily. 04/20/14   Barton Dubois, MD  pantoprazole (PROTONIX) 40 MG tablet Take 1 tablet (40 mg total) by mouth daily at 12 noon. 04/20/14   Barton Dubois, MD  potassium chloride SA (K-DUR,KLOR-CON) 20 MEQ tablet Take 1 tablet (20 mEq total) by mouth daily. 04/26/14   Thurnell Lose, MD  predniSONE (DELTASONE) 10 MG tablet Take 5 tabs X 1 day; then 4 tablets by mouth daily X 2 days; then 2 tablet by mouth X 3 days; then resume daily 10mg  dose. 04/20/14   Barton Dubois, MD    Physical Exam: Filed Vitals:    05/13/14 2300 05/14/14 0130 05/14/14 0200 05/14/14 0254  BP: 138/97 142/47 121/53 166/93  Pulse: 106 90 94 99  Temp:    98.5 F (36.9 C)  TempSrc:    Oral  Resp: 22 26 18 22   Height:    5' 8.5" (1.74 m)  Weight:    100.835 kg (222 lb 4.8 oz)  SpO2: 94% 94% 93% 95%   General: Not in acute distress HEENT:       Eyes: PERRL, EOMI, no scleral icterus       ENT: No discharge from the ears and nose, no pharynx injection, no tonsillar enlargement.        Neck: No JVD, no bruit, no mass felt. Cardiac: S1/S2, RRR, No murmurs, gallops or rubs Pulm: decreased air movement bilaterally. Has wheezing and rhonchi posteriorly, no rubs. Abd: Soft, nondistended, nontender, no rebound pain, no organomegaly, BS present Ext: 2+ leg edema bilaterally.12+DP/PT pulse bilaterally Musculoskeletal: No joint deformities, erythema, or stiffness, ROM full Skin: No rashes.  Neuro: Alert and oriented X3, cranial nerves II-XII grossly intact, muscle strength 5/5 in all extremeties, sensation to light touch intact.  Psych: Patient is not psychotic, no suicidal or hemocidal ideation.  Labs on Admission:  Basic Metabolic Panel:  Recent Labs Lab 05/13/14 2140 05/14/14 0305  NA 145 139  K 4.1 3.3*  CL 102 99  CO2 24 24  GLUCOSE 105* 110*  BUN 22 26*  CREATININE 1.32 1.30  CALCIUM 10.0 9.7  MG  --  2.1   Liver Function Tests:  Recent Labs Lab 05/14/14 0305  AST 18  ALT 19  ALKPHOS 56  BILITOT 0.3  PROT 7.1  ALBUMIN 3.9   No results for input(s): LIPASE, AMYLASE in the last 168 hours. No results for input(s): AMMONIA in the last 168 hours. CBC:  Recent Labs Lab 05/13/14 2140 05/14/14  0305  WBC 7.2 7.9  NEUTROABS 5.2  --   HGB 13.4 12.6*  HCT 40.5 37.8*  MCV 92.7 92.6  PLT 186 176   Cardiac Enzymes:  Recent Labs Lab 05/13/14 2140 05/14/14 0305  TROPONINI <0.30 <0.30    BNP (last 3 results)  Recent Labs  03/20/14 0906 04/17/14 0049 05/13/14 2140  PROBNP 185.0* 1368.0*  697.9*   CBG: No results for input(s): GLUCAP in the last 168 hours.  Radiological Exams on Admission: Dg Chest 2 View  05/13/2014   CLINICAL DATA:  Strain shortness of breath and chest pain intensifying over several days. History of COPD and CABG. History of paroxysmal atrial fibrillation.  EXAM: CHEST  2 VIEW  COMPARISON:  04/17/2014.  FINDINGS: The cardiac silhouette is near the upper limit of normal in size with an interval decrease in size. Increased prominence of the pulmonary vasculature. The interstitial markings remain mildly prominent with no pleural fluid seen. The hemidiaphragms are flattened and central peribronchial thickening is again demonstrated. Thoracic spine degenerative changes and changes of DISH. Upper abdominal and gastroesophageal junction region surgical clips.  IMPRESSION: No acute abnormality.  Changes of COPD and chronic bronchitis.   Electronically Signed   By: Enrique Sack M.D.   On: 05/13/2014 21:53    EKG: Independently reviewed.   Assessment/Plan Principal Problem:   COPD exacerbation Active Problems:   ADENOCARCINOMA, PROSTATE   Essential hypertension   OSA (obstructive sleep apnea)   PAF (paroxysmal atrial fibrillation)   History of renal cell cancer   Chronic kidney disease, stage 2, mildly decreased GFR   CHF (congestive heart failure)   CAD (coronary artery disease) of artery bypass graft   SOB: his SOB is likely due to combination of COPD exacerbation and CHF exacerbation. Patient has productive cough, and wheezing and rhonchi on auscultation, which are consistent with COPD exacerbation He is clinically fluid overloaded on physical examination with at least 2+ bilateral leg edema, and his body weight increased by 10 pounds per patient, which are consistent with CHF exacerbation. His relatively low proBNP at 698 is likely due to obesity,  which makes ProBNP falsely low. Another potential differential diagnosis is pulmonary embolism, which is less likely  given patient is taking Eliquis.   - will admit to tele bed - treat  COPD exacerbation: albuterol and DuoNeb nebulizers, Z-Pak, Solu-Medrol 60 every 6 hours - treat CHF exacerbations: Lasix 80 mg twice a day; check BMP and magnesium level in morning - repeat 2D ehco   CAD: s/p of cardic cath on 04/19/14. Per dr. Dr. Kyla Balzarine note, hee was found to have moderately diseased left main coronary artery; severely diseased native mid left anterior descending artery; patent LIMA to LAD; severe disease in the native left circumflex artery and its branches; patent SVG to OM with mild, proximal disease in the graft; occluded native right coronary artery. Dr. Johnsie Cancel recommended medical management. Currently patient has chest pain which is pleuritic in nature, dose not seem to be due to cardiac origin. The potential differential diagnosis is pericarditis given his pleuritic chest pain. - continue home aspirin - Continue Eliquis - Nitroglycerin SL when necessary - Isosorbide mononitrate - trop x 3 - Repeat EKG in AM - 2d echo to r/o pericarditis - patient is not on statin, not clear why not on statin.  His LF is normal. Will start Lipitor 40 mg daily.  CKD-II:  His BL creatinine is between 1.1-1.36. His creatinine is 1.32 on admission which is at his  baseline. -Follow-up renal function by BMP.  PVD: s/p of right leg bypass surgery per patient.  - continue ASA  GERD:  on Protonix   DVT ppx: on Eliquis Code Status: Full code Family Communication: None at bed side.   Disposition Plan: Admit to inpatient   Date of Service 05/14/2014    Ivor Costa [facility] Pager [phone]  If 7PM-7AM, please contact night-coverage [email or web address] Password [facility] 05/14/2014, 4:33 AM

## 2014-05-14 NOTE — ED Notes (Signed)
Admitting MD at bedside.

## 2014-05-14 NOTE — Progress Notes (Signed)
Patient seen and evaluated this AM by my associate. Has history of clot in right leg and was off of eliquis for 2 days.  Will place order for CT angiogram of chest as I would like to rule out PE. Echocardiogram ordered to assess for pericarditis. Chest pain sounds pleuritic in nature.  Will add oxy IR for pain relief. Pt has been on dilaudid and norco for 7 years he reports. As such tolerance to opiods is increased.  Our team to reassess next am.  Velvet Bathe

## 2014-05-14 NOTE — Progress Notes (Signed)
Received a page from tele about possible V-fib.  Pt. Was asymptomatic.  Looked at strip with two other nurses and concluded it was not real v-fib.  Will continue to monitor.

## 2014-05-14 NOTE — ED Notes (Signed)
PA notified of patient pain level

## 2014-05-14 NOTE — Progress Notes (Signed)
  Echocardiogram 2D Echocardiogram has been performed.  Lucas [name] 05/14/2014, 3:52 PM

## 2014-05-14 NOTE — Progress Notes (Signed)
Patient complains of claustrophobia and therefore, does not tolerate nocturnal CPAP. He declines the use of a machine with this admission and denies the use of one at home. He does, however, admit to having had used a CPAP in the past, but states he was "miserable" and that he "can't handle that again". Also, he complains that the hour long nebulizer caused his chest pain to worsen, although he does agree to be breathing better. He prefers to stay on his home regimen. RT protocol assessment completed. Orders changed accordingly.

## 2014-05-14 NOTE — Plan of Care (Signed)
Problem: Phase I Progression Outcomes Goal: O2 sats > or equal 90% or at baseline Outcome: Completed/Met Date Met:  05/14/14 Goal: Hemodynamically stable Outcome: Completed/Met Date Met:  05/14/14

## 2014-05-14 NOTE — Care Management Note (Addendum)
    Page 1 of 1   05/17/2014     1:13:13 PM CARE MANAGEMENT NOTE 05/17/2014  Patient:  Lucas [name], Lucas [name]   Account Number:  [id number]  Date Initiated:  05/14/2014  Documentation initiated by:  Dessa Phi  Subjective/Objective Assessment:   68 Y/O M ADMITTED W/COPD EXAC.READMIT 10/6-10/10.     Action/Plan:   FROM HOME.HAS HOME 02-LINCARE,CANE.HAS PCP,PHARMACY.HAS USED [facility] IN PAST.   Anticipated DC Date:  05/17/2014   Anticipated DC Plan:  [place]  CM consult      Choice offered to / List presented to:  C-1 Patient        [place] arranged  HH-1 RN      [place].   Status of service:  Completed, signed off Medicare Important Message given?   (If response is "NO", the following Medicare IM given date fields will be blank) Date Medicare IM given:   Medicare IM given by:   Date Additional Medicare IM given:   Additional Medicare IM given by:    Discharge Disposition:  [place]  Per UR Regulation:  Reviewed for med. necessity/level of care/duration of stay  If discussed at [place] of Stay Meetings, dates discussed:    Comments:  05/17/14 Jamear Carbonneau RN,BSN NCM [place] ON 05/14/14 BROUGHT TO RM PER [name] @ [facility].CONFIRMED THAT PATIENT IS LEAVING HOSPITAL W/HOME 02.PATIENT CHOSE [facility] FOR HHRN-[facility] REP AWARE OF D/C & HHRN ORDER.NO FURTHER D/C NEEDS.  05/14/14 Stacy Deshler RN,BSN NCM [phone] SPOKE TO PATIENT ABOUT D/C PLANS.STATES HE GOES OUT TO BUY GROCERIES, OTHERWISE HE IS HOMEBOUND.[facility] CHOSEN FOR HHC.TC [facility] REP [facility] AWARE OF REFERRAL.RECOMMEND HHRN.RECOMMEND PT CONS.AWAIT FINAL HHC ORDERS.PATIENT NOT APPROPRIATE FOR THN DUE TO INSURANCE.

## 2014-05-14 NOTE — ED Provider Notes (Signed)
CSN: [id number]     Arrival date & time 05/13/14  2111 History   First MD Initiated Contact with Patient 05/13/14 2120     Chief Complaint  Patient presents with  . Shortness of Breath  . Chest Pain     (Consider location/radiation/quality/duration/timing/severity/associated sxs/prior Treatment) HPI Patient presents to the emergency department with shortness of breath and chest discomfort over the last 3 days.  Patient states that he had increased work of breathing over the last couple of days his inhalers have not been controlling his symptoms.  Patient also is complaining of tightness in his chest with breathing.  Patient states that he feels like he has had some weight gain over the last few days.  Patient denies nausea, vomiting, abdominal pain, headache, blurred vision, back pain, neck pain, dysuria, fever, weakness, dizziness or syncope Past Medical History  Diagnosis Date  . CAD (coronary artery disease)     a. s/p CABG x 5;  b. 04/2012 Cath: patent LIMA->LAD and VG->OM3, all other grafts occluded. c. Cath 12/2013: patent SVG-OM3 & LIMA-LAD, known occ other VG.  Marland Kitchen Hypertension   . Adjustment disorder with anxiety   . Hyperlipidemia     a. Unable to take statins.  Marland Kitchen BPH (benign prostatic hypertrophy)   . COPD (chronic obstructive pulmonary disease)     a. on home O2.  . Arthritis   . Ischemic cardiomyopathy     a. 06/2013 Echo: EF 30-35%, no reg wma's, Gr2 DD, triv AI, mod dil LA, nl RV fxn.  . Chronic systolic CHF (congestive heart failure)     a. 06/2013 Echo: EF 30-35%. b. 11/08/13 EF 40-45%, LVH, HK of the mid-distalanterseptal myocardium, trivial Ao regurg, calcified mitral annulus, RA mildly dilated  . Prostate cancer dx'd 2012  . Renal cancer dx'd 1997    lt nephrectomy  . CKD (chronic kidney disease), stage III   . Tobacco abuse     a. 8 yr hx as of 2015, transitioned to e-cig.  Marland Kitchen Syncope     a. 08/2013: the day following cath, no injury, had received multiple doses of  Versed for anxiety - this was felt to be a contributing factor.  Marland Kitchen PAF (paroxysmal atrial fibrillation)     a. on Eliquis 5 mg bid  . Hypotension     a. H/o soft BP prohibiting ACEI/ARB use.  . Chronic pain   . PAD (peripheral artery disease)     a. 12/2013: PV angio s/p angioplasty to R external iliac artery stenosis, also has R SFA occlusion with reconstitution of above-knee popliteal artery, 2 vessel runoff via peroneal and posterior tib.b. LE doppler 02/2014 ABI right 0.52, left 0.95  . Pulmonary nodules     a. 12/2013:  small pulmonary nodules measuring up to 64mm in RM/LL, f/u recommended 6-12 months.  . Anxiety Dx 2004   Past Surgical History  Procedure Laterality Date  . Coronary artery bypass graft      x 5  . Cardiac catheterization    . Nephrectomy      left nephrectomy for ca  . Posterior cervical laminectomy      x 8   limited ROM  and can't lie flat  . Heart stents      x 5  . Robot assisted laparoscopic radical prostatectomy      for prostate cancer  . Cystoscopy with litholapaxy  05/01/2012    Procedure: CYSTOSCOPY WITH LITHOLAPAXY;  Surgeon: Dutch Gray, MD;  Location: [facility];  Service: Urology;  Laterality: N/A;  . Prostate cancer    . Kidney surgery Left 1994   Family History  Problem Relation Age of Onset  . Diabetes Mother   . Heart disease Mother   . Hypertension Mother   . Heart attack Mother   . Coronary artery disease    . Heart disease Father   . Diabetes Father   . Heart attack Father   . Heart disease Brother   . Heart attack Brother   . COPD Sister    History  Substance Use Topics  . Smoking status: Former Smoker -- 0.30 packs/day for 54 years    Types: Cigarettes    Quit date: 02/04/2014  . Smokeless tobacco: Former Systems developer    Quit date: 04/19/2012     Comment: Using e-cig now  . Alcohol Use: No    Review of Systems  All other systems negative except as documented in the HPI. All pertinent positives and negatives as reviewed in the  HPI.  Allergies  Ativan; Lisinopril; Morphine and related; and Ibuprofen  Home Medications   Prior to Admission medications   Medication Sig Start Date End Date Taking? Authorizing Provider  albuterol (PROVENTIL HFA;VENTOLIN HFA) 108 (90 BASE) MCG/ACT inhaler Inhale 2 puffs into the lungs every 6 (six) hours as needed for wheezing or shortness of breath. Shortness of breath 02/27/14  Yes Brand Males, MD  albuterol (PROVENTIL) (2.5 MG/3ML) 0.083% nebulizer solution Take 3 mLs (2.5 mg total) by nebulization 4 (four) times daily. 05/06/14  Yes Josalyn C Funches, MD  ALPRAZolam (XANAX) 1 MG tablet Take 1 tablet (1 mg total) by mouth 3 (three) times daily as needed for anxiety. 11/09/13  Yes Theodis Blaze, MD  apixaban (ELIQUIS) 5 MG TABS tablet Take 1 tablet (5 mg total) by mouth 2 (two) times daily. 05/08/14  Yes Serafina Mitchell, MD  aspirin EC 81 MG tablet Take 81 mg by mouth daily.   Yes Historical Provider, MD  doxazosin (CARDURA) 8 MG tablet Take 8 mg by mouth at bedtime.    Yes Historical Provider, MD  furosemide (LASIX) 20 MG tablet Take 40 mg by mouth 2 (two) times daily.  04/26/14  Yes Thurnell Lose, MD  HYDROcodone-acetaminophen (NORCO) 10-325 MG per tablet Take 1 tablet by mouth 5 (five) times daily. 04/21/14  Yes Historical Provider, MD  HYDROmorphone (DILAUDID) 4 MG tablet Take 1 tablet (4 mg total) by mouth every 6 (six) hours as needed for severe pain. 05/03/14  Yes Josalyn C Funches, MD  ipratropium (ATROVENT) 0.02 % nebulizer solution Take 0.5 mg by nebulization 4 (four) times daily.   Yes Historical Provider, MD  Ipratropium-Albuterol (COMBIVENT RESPIMAT) 20-100 MCG/ACT AERS respimat Inhale 1 puff into the lungs 4 (four) times daily. 04/02/14  Yes Brand Males, MD  mometasone-formoterol (DULERA) 100-5 MCG/ACT AERO Inhale 2 puffs into the lungs 2 (two) times daily. 11/02/13  Yes Domenic Polite, MD  nitroGLYCERIN (NITROSTAT) 0.3 MG SL tablet Place 0.3 mg under the tongue every  5 (five) minutes as needed for chest pain.   Yes Historical Provider, MD  predniSONE (DELTASONE) 10 MG tablet Take 10 mg by mouth daily with breakfast.   Yes Historical Provider, MD  valsartan (DIOVAN) 40 MG tablet Take 40 mg by mouth daily.  02/13/14  Yes Historical Provider, MD  isosorbide mononitrate (IMDUR) 30 MG 24 hr tablet Take 1 tablet (30 mg total) by mouth daily. 04/20/14   Barton Dubois, MD  pantoprazole (PROTONIX) 40 MG  tablet Take 1 tablet (40 mg total) by mouth daily at 12 noon. 04/20/14   Barton Dubois, MD  potassium chloride SA (K-DUR,KLOR-CON) 20 MEQ tablet Take 1 tablet (20 mEq total) by mouth daily. 04/26/14   Thurnell Lose, MD  predniSONE (DELTASONE) 10 MG tablet Take 5 tabs X 1 day; then 4 tablets by mouth daily X 2 days; then 2 tablet by mouth X 3 days; then resume daily 10mg  dose. 04/20/14   Barton Dubois, MD   BP 138/97 mmHg  Pulse 106  Temp(Src) 98.6 F (37 C) (Oral)  Resp 22  SpO2 94% Physical Exam  Constitutional: He is oriented to person, place, and time. He appears well-developed and well-nourished. No distress.  HENT:  Head: Normocephalic and atraumatic.  Mouth/Throat: Oropharynx is clear and moist.  Eyes: Pupils are equal, round, and reactive to light.  Neck: Normal range of motion. Neck supple.  Cardiovascular: Normal rate, regular rhythm and normal heart sounds.  Exam reveals no friction rub.   Pulmonary/Chest: No accessory muscle usage. Tachypnea noted. He is in respiratory distress. He has no decreased breath sounds. He has wheezes. He has rhonchi. He has rales.  Musculoskeletal: He exhibits edema.  Neurological: He is alert and oriented to person, place, and time. He exhibits normal muscle tone. Coordination normal.  Skin: Skin is warm and dry. No rash noted. No erythema.  Nursing note and vitals reviewed.   ED Course  Procedures (including critical care time) Labs Review Labs Reviewed  BASIC METABOLIC PANEL - Abnormal; Notable for the following:     Glucose, Bld 105 (*)    GFR calc non Af Amer 54 (*)    GFR calc Af Amer 62 (*)    Anion gap 19 (*)    All other components within normal limits  CBC WITH DIFFERENTIAL - Abnormal; Notable for the following:    RDW 15.8 (*)    All other components within normal limits  PRO B NATRIURETIC PEPTIDE - Abnormal; Notable for the following:    Pro B Natriuretic peptide (BNP) 697.9 (*)    All other components within normal limits  BLOOD GAS, VENOUS - Abnormal; Notable for the following:    pH, Ven 7.437 (*)    pCO2, Ven 36.7 (*)    Bicarbonate 24.4 (*)    All other components within normal limits  TROPONIN I    Imaging Review Dg Chest 2 View  05/13/2014   CLINICAL DATA:  Strain shortness of breath and chest pain intensifying over several days. History of COPD and CABG. History of paroxysmal atrial fibrillation.  EXAM: CHEST  2 VIEW  COMPARISON:  04/17/2014.  FINDINGS: The cardiac silhouette is near the upper limit of normal in size with an interval decrease in size. Increased prominence of the pulmonary vasculature. The interstitial markings remain mildly prominent with no pleural fluid seen. The hemidiaphragms are flattened and central peribronchial thickening is again demonstrated. Thoracic spine degenerative changes and changes of DISH. Upper abdominal and gastroesophageal junction region surgical clips.  IMPRESSION: No acute abnormality.  Changes of COPD and chronic bronchitis.   Electronically Signed   By: Enrique Sack M.D.   On: 05/13/2014 21:53     EKG Interpretation   Date/Time:  Monday May 13 2014 21:19:56 EST Ventricular Rate:  99 PR Interval:  150 QRS Duration: 99 QT Interval:  373 QTC Calculation: 479 R Axis:   73 Text Interpretation:  Sinus tachycardia Ventricular premature complex  Anterior infarct, old No significant change since  last tracing Confirmed  by KNAPP  MD-J, JON [phone]) on 05/13/2014 9:26:52 PM      MDM   Final diagnoses:  None      Patient will be  admitted for further evaluation and care of this COPD exacerbation.  Patient was stable here in the emergency department  Brent General, PA-C 05/18/14 0136  Dorie Rank, MD 05/19/14 [phone]

## 2014-05-15 ENCOUNTER — Telehealth: Payer: Self-pay | Admitting: Family Medicine

## 2014-05-15 DIAGNOSIS — I10 Essential (primary) hypertension: Secondary | ICD-10-CM

## 2014-05-15 DIAGNOSIS — N179 Acute kidney failure, unspecified: Secondary | ICD-10-CM | POA: Diagnosis present

## 2014-05-15 DIAGNOSIS — I5031 Acute diastolic (congestive) heart failure: Secondary | ICD-10-CM

## 2014-05-15 DIAGNOSIS — N182 Chronic kidney disease, stage 2 (mild): Secondary | ICD-10-CM

## 2014-05-15 DIAGNOSIS — N189 Chronic kidney disease, unspecified: Secondary | ICD-10-CM

## 2014-05-15 LAB — BASIC METABOLIC PANEL
ANION GAP: 16 — AB (ref 5–15)
BUN: 45 mg/dL — ABNORMAL HIGH (ref 6–23)
CO2: 25 mEq/L (ref 19–32)
CREATININE: 1.8 mg/dL — AB (ref 0.50–1.35)
Calcium: 9.7 mg/dL (ref 8.4–10.5)
Chloride: 96 mEq/L (ref 96–112)
GFR, EST AFRICAN AMERICAN: 43 mL/min — AB (ref >90)
GFR, EST NON AFRICAN AMERICAN: 37 mL/min — AB (ref >90)
Glucose, Bld: 179 mg/dL — ABNORMAL HIGH (ref 70–99)
Potassium: 4.9 mEq/L (ref 3.7–5.3)
Sodium: 137 mEq/L (ref 137–147)

## 2014-05-15 MED ORDER — FUROSEMIDE 10 MG/ML IJ SOLN
80.0000 mg | Freq: Three times a day (TID) | INTRAMUSCULAR | Status: DC
  Administered 2014-05-15 – 2014-05-16 (×4): 80 mg via INTRAVENOUS
  Filled 2014-05-15 (×6): qty 8

## 2014-05-15 MED ORDER — ISOSORBIDE MONONITRATE ER 60 MG PO TB24
60.0000 mg | ORAL_TABLET | Freq: Every day | ORAL | Status: DC
  Administered 2014-05-15 – 2014-05-17 (×2): 60 mg via ORAL
  Filled 2014-05-15 (×3): qty 1

## 2014-05-15 MED ORDER — GUAIFENESIN ER 600 MG PO TB12
1200.0000 mg | ORAL_TABLET | Freq: Two times a day (BID) | ORAL | Status: DC
  Administered 2014-05-15: 1200 mg via ORAL
  Administered 2014-05-16: 600 mg via ORAL
  Administered 2014-05-16 – 2014-05-17 (×2): 1200 mg via ORAL
  Filled 2014-05-15 (×5): qty 2

## 2014-05-15 NOTE — Progress Notes (Signed)
[facility] PROGRESS NOTE  Lucas [name] SNK:[id number] DOB: 1946/05/17 DOA: 05/13/2014 PCP: Minerva Ends, MD  Assessment/Plan: Acute on chronic hypoxic respiratory failure secondary to COPD exacerbation and acute diastolic CHF Still very wheezy and edematous placed on IV lasix 80 mg bid , will increase to 80 tid. 2 d echo with normal EF ( was 45% previously) with diastolic dysfn) Monitor strict I/O and daily weights -will consult cardiology if unimproved -CT angiogram of the chest negative for PE.  COPD exacerbation Continue with IV Solu-Medrol, DuoNeb nebs and when necessary albuterol neb Continue azithromycin and antitussives  Acute diastolic CHF As outlined above.  Coronary artery disease with recent unstable angina Patient underwent cardiac cath on 10/9/2015with moderate disease of the left main coronary artery, severe native mid left anterior descending artery disease and severe disease of the native left circumflex artery and is branches. Recommended for medical management Patient reports off and on chest pain which appears pleuritic. Continue aspirin, Eliquis, sublingual nitrate as needed. Will increase Imdur dose to 60 mg  Once daily. Serial troponin negative. 2-D echo with improved EF Started on statin.  Acute on Chronic kidney disease stage II Noted for worsening creatinine (baseline 1.1-1.3), likely second to IV Lasix. Monitor  Peripheral vascular disease Continue aspirin  DVT prophylaxis: On Eliquis    Code Status:full code Family Communication: none at bedside Disposition Plan: home once improved   Consultants:  none  Procedures:  none  Antibiotics:  azithromycin ( 113--)  HPI/Subjective: Reports shortness of breath and some chest discomfort off and on ( worsened with cough)   Objective: Filed Vitals:   05/15/14 0306  BP: 152/77  Pulse: 86  Temp: 97.7 F (36.5 C)  Resp: 22    Intake/Output Summary (Last 24 hours) at  05/15/14 0855 Last data filed at 05/15/14 0825  Gross per 24 hour  Intake   1640 ml  Output   1800 ml  Net   -160 ml   Filed Weights   05/14/14 0254 05/15/14 0304  Weight: 100.835 kg (222 lb 4.8 oz) 102.876 kg (226 lb 12.8 oz)    Exam:   General: elderly obese male in some distress with dyspnea  HEENT: no pallor, moist mucosa, no JVD  CHEST: diffuse expiratory wheezing b/l  CVS: N S1&S2, no murmurs  Abd: distended, NT, BD+  Ext: warm, 1 +pitting edema upto lower  lower thigh  CNS: alert and oriented.    Data Reviewed: Basic Metabolic Panel:  Recent Labs Lab 05/13/14 2140 05/14/14 0305  NA 145 139  K 4.1 3.3*  CL 102 99  CO2 24 24  GLUCOSE 105* 110*  BUN 22 26*  CREATININE 1.32 1.30  CALCIUM 10.0 9.7  MG  --  2.1   Liver Function Tests:  Recent Labs Lab 05/14/14 0305  AST 18  ALT 19  ALKPHOS 56  BILITOT 0.3  PROT 7.1  ALBUMIN 3.9   No results for input(s): LIPASE, AMYLASE in the last 168 hours. No results for input(s): AMMONIA in the last 168 hours. CBC:  Recent Labs Lab 05/13/14 2140 05/14/14 0305  WBC 7.2 7.9  NEUTROABS 5.2  --   HGB 13.4 12.6*  HCT 40.5 37.8*  MCV 92.7 92.6  PLT 186 176   Cardiac Enzymes:  Recent Labs Lab 05/13/14 2140 05/14/14 0305 05/14/14 0837 05/14/14 1434  TROPONINI <0.30 <0.30 <0.30 <0.30   BNP (last 3 results)  Recent Labs  03/20/14 0906 04/17/14 0049 05/13/14 2140  PROBNP 185.0* 1368.0*  697.9*   CBG: No results for input(s): GLUCAP in the last 168 hours.  Recent Results (from the past 240 hour(s))  Culture, sputum-assessment     Status: None   Collection Time: 05/14/14  6:14 AM  Result Value Ref Range Status   Specimen Description SPUTUM  Final   Special Requests NONE  Final   Sputum evaluation   Final    THIS SPECIMEN IS ACCEPTABLE. RESPIRATORY CULTURE REPORT TO FOLLOW.   Report Status 05/14/2014 FINAL  Final  Culture, respiratory (NON-Expectorated)     Status: None (Preliminary  result)   Collection Time: 05/14/14  6:14 AM  Result Value Ref Range Status   Specimen Description SPUTUM  Final   Special Requests NONE  Final   Gram Stain   Final    MODERATE WBC PRESENT, PREDOMINANTLY PMN RARE SQUAMOUS EPITHELIAL CELLS PRESENT MODERATE GRAM NEGATIVE COCCI FEW GRAM POSITIVE COCCI IN PAIRS RARE GRAM POSITIVE RODS    Culture   Final    NORMAL OROPHARYNGEAL FLORA Performed at [facility]    Report Status PENDING  Incomplete     Studies: Dg Chest 2 View  05/13/2014   CLINICAL DATA:  Strain shortness of breath and chest pain intensifying over several days. History of COPD and CABG. History of paroxysmal atrial fibrillation.  EXAM: CHEST  2 VIEW  COMPARISON:  04/17/2014.  FINDINGS: The cardiac silhouette is near the upper limit of normal in size with an interval decrease in size. Increased prominence of the pulmonary vasculature. The interstitial markings remain mildly prominent with no pleural fluid seen. The hemidiaphragms are flattened and central peribronchial thickening is again demonstrated. Thoracic spine degenerative changes and changes of DISH. Upper abdominal and gastroesophageal junction region surgical clips.  IMPRESSION: No acute abnormality.  Changes of COPD and chronic bronchitis.   Electronically Signed   By: Enrique Sack M.D.   On: 05/13/2014 21:53   Ct Angio Chest Pe W/cm &/or Wo Cm  05/14/2014   CLINICAL DATA:  Cough and shortness of breath 5 days with anterior chest pain. Rule out pulmonary embolism.  EXAM: CT ANGIOGRAPHY CHEST WITH CONTRAST  TECHNIQUE: Multidetector CT imaging of the chest was performed using the standard protocol during bolus administration of intravenous contrast. Multiplanar CT image reconstructions and MIPs were obtained to evaluate the vascular anatomy.  CONTRAST:  174mL OMNIPAQUE IOHEXOL 350 MG/ML SOLN  COMPARISON:  01/07/2014 as well as abdominal CT 08/22/2013.  FINDINGS: Lungs are adequately inflated with mild centrilobular  emphysematous disease. There are 4 small peripheral right lung nodules with the largest measuring approximately 6 mm over the posterior right lower lobe as these are all unchanged. There is no focal consolidation or effusion. Airways are within normal.  Heart is normal in size. Mild calcified plaque over the coronary arteries. Evidence of prior median sternotomy. No definite pulmonary emboli are seen, although the pulmonary arterial contrast density is not optimal as a small peripheral embolus may not be visualized on this exam. There is minimal calcified plaque involving the thoracic aorta. There is no mediastinal or hilar adenopathy.  Images through the upper abdomen demonstrate evidence of a prior left nephrectomy. There is a sub cm hypodensity over the mid to upper pole cortex of the right kidney likely a cyst but too small to characterize as this is unchanged. There are degenerative changes of the spine.  Review of the MIP images confirms the above findings.  IMPRESSION: No acute cardiopulmonary disease and no definite evidence of pulmonary embolism.  Mild emphysematous disease with several small stable right lung nodules with the largest measuring 6 mm over the right lower lobe. Recommend followup CT in 1 year. This recommendation follows the consensus statement: Guidelines for Management of Small Pulmonary Nodules Detected on CT Scans: A Statement from the [employer] as published in Radiology 2005; 237:395-400. Online at: https://www.arnold.com/.  Sub cm hypodensity over the mid to upper pole right kidney unchanged and likely cysts too small to characterize.   Electronically Signed   By: Marin Olp M.D.   On: 05/14/2014 10:30    Scheduled Meds: . antiseptic oral rinse  7 mL Mouth Rinse BID  . apixaban  5 mg Oral BID  . aspirin EC  81 mg Oral Daily  . atorvastatin  40 mg Oral q1800  . azithromycin  250 mg Oral Daily  . doxazosin  8 mg Oral QHS  . furosemide   80 mg Intravenous 3 times per day  . HYDROcodone-acetaminophen  1 tablet Oral 5 X Daily  . ipratropium-albuterol  3 mL Nebulization QID  . irbesartan  37.5 mg Oral Daily  . isosorbide mononitrate  60 mg Oral Daily  . methylPREDNISolone (SOLU-MEDROL) injection  60 mg Intravenous 4 times per day  . nicotine  21 mg Transdermal Daily  . pantoprazole  40 mg Oral Q1200  . sodium chloride  3 mL Intravenous Q12H  . sodium chloride  3 mL Intravenous Q12H   Continuous Infusions:    Time spent: 35 minutes    Lucas [name], [place]  [facility] Pager [phone] If 7PM-7AM, please contact night-coverage at [email or web address], password [facility] 05/15/2014, 8:55 AM  LOS: 2 days

## 2014-05-15 NOTE — Progress Notes (Signed)
Patient had gained 4lbs overnight. NP notified and new orders were given to give the 0800 dose of IV lasix.

## 2014-05-15 NOTE — Telephone Encounter (Signed)
Nurse from [facility] called to let PCP know that pt. Was in hospital.

## 2014-05-15 NOTE — Telephone Encounter (Signed)
Dr. Adrian Blackwater,  Lucas [name] from [facility] called to inform PCP that pt is currently admitted to hospital.

## 2014-05-15 NOTE — Clinical Documentation Improvement (Signed)
Presents with COPD exacerbation, O2 dependent, CHF exacerbation noted in H&P.   BNP = 698  ECHO reveals: abnormal LV relaxation, EF of 50 - 96%, Grade 1 diastolic dysfunction, mild focal basal hypertrophy  Being treated with IV Lasix 80 mg Q8H  Please clarify the type and stage of CHF: Systolic, diastolic, systolic and diastolic       Acute, chronic, acute on chronic       Other Condition  Thank You, Zoila Shutter ,RN Clinical Documentation Specialist:  [facility] [employer] [facility]

## 2014-05-15 NOTE — Progress Notes (Signed)
Agree with previous RN assessment. Will continue to monitor pt.

## 2014-05-15 NOTE — Telephone Encounter (Signed)
Noted  

## 2014-05-16 DIAGNOSIS — R079 Chest pain, unspecified: Secondary | ICD-10-CM | POA: Diagnosis present

## 2014-05-16 DIAGNOSIS — N179 Acute kidney failure, unspecified: Secondary | ICD-10-CM

## 2014-05-16 DIAGNOSIS — Z7901 Long term (current) use of anticoagulants: Secondary | ICD-10-CM

## 2014-05-16 DIAGNOSIS — I480 Paroxysmal atrial fibrillation: Secondary | ICD-10-CM

## 2014-05-16 DIAGNOSIS — R0789 Other chest pain: Secondary | ICD-10-CM

## 2014-05-16 DIAGNOSIS — N189 Chronic kidney disease, unspecified: Secondary | ICD-10-CM

## 2014-05-16 DIAGNOSIS — I5033 Acute on chronic diastolic (congestive) heart failure: Principal | ICD-10-CM

## 2014-05-16 DIAGNOSIS — R072 Precordial pain: Secondary | ICD-10-CM

## 2014-05-16 LAB — BASIC METABOLIC PANEL
ANION GAP: 15 (ref 5–15)
BUN: 64 mg/dL — AB (ref 6–23)
CALCIUM: 9.8 mg/dL (ref 8.4–10.5)
CHLORIDE: 96 meq/L (ref 96–112)
CO2: 26 mEq/L (ref 19–32)
CREATININE: 2.11 mg/dL — AB (ref 0.50–1.35)
GFR, EST AFRICAN AMERICAN: 35 mL/min — AB (ref >90)
GFR, EST NON AFRICAN AMERICAN: 31 mL/min — AB (ref >90)
Glucose, Bld: 144 mg/dL — ABNORMAL HIGH (ref 70–99)
Potassium: 4.9 mEq/L (ref 3.7–5.3)
Sodium: 137 mEq/L (ref 137–147)

## 2014-05-16 MED ORDER — HYDROMORPHONE HCL 2 MG/ML IJ SOLN
2.0000 mg | Freq: Once | INTRAMUSCULAR | Status: AC
  Administered 2014-05-16: 2 mg via INTRAVENOUS
  Filled 2014-05-16: qty 1

## 2014-05-16 MED ORDER — METHYLPREDNISOLONE SODIUM SUCC 125 MG IJ SOLR
60.0000 mg | Freq: Two times a day (BID) | INTRAMUSCULAR | Status: DC
  Administered 2014-05-16 – 2014-05-17 (×2): 60 mg via INTRAVENOUS
  Filled 2014-05-16 (×3): qty 0.96

## 2014-05-16 MED ORDER — FUROSEMIDE 40 MG PO TABS: 40.0000 mg | ORAL_TABLET | Freq: Two times a day (BID) | ORAL | Status: DC

## 2014-05-16 NOTE — Evaluation (Signed)
Physical Therapy Evaluation Patient Details Name: Lucas [name] MRN: [id number] DOB: 17-Oct-1945 Today's Date: 05/16/2014   History of Present Illness  68 yo male admitted with COPD exac, chest pain, LE edema. hx of CHF, COPD, CAD, HTN, A fib, CKD, prostate cancer. Pt is from home alone.   Clinical Impression  On eval, pt was supervision-mod independent with mobility-ambulated ~100 feet with O2. (Pt observed to be up and walking around without assistance before and after PT eval). HR 123 bpm when walking with PT so deferred further ambulation and insisted to pt that he sit and rest for a while. Pt was very anxious and restless. Pt complied for a brief period of time but then he was up and about again. Do not anticipate pt will have any follow up PT needs but pt may benefit from home safety evaluation.     Follow Up Recommendations No PT follow up (home safety evaluation)    Equipment Recommendations  None recommended by PT (has cane at home)    Recommendations for Other Services       Precautions / Restrictions Precautions Precautions: Fall Restrictions Weight Bearing Restrictions: No      Mobility  Bed Mobility               General bed mobility comments: pt up ambulating in room  Transfers Overall transfer level: Modified independent                  Ambulation/Gait Ambulation/Gait assistance: Supervision Ambulation Distance (Feet): 100 Feet Assistive device: None (pt pushing O2 tank) Gait Pattern/deviations: Step-through pattern;Decreased stride length     General Gait Details: HR 123 bpm so deferred further ambulation. Pt also c/o chest pain still.   Stairs            Wheelchair Mobility    Modified Rankin (Stroke Patients Only)       Balance                                             Pertinent Vitals/Pain Pain Assessment: 0-10 Pain Score: 8  Pain Location: chest Pain Intervention(s): Monitored during  session;Relaxation    Home Living Family/patient expects to be discharged to:: Private residence Living Arrangements: Alone   Type of Home: Apartment Home Access: Stairs to enter Entrance Stairs-Rails: Right Entrance Stairs-Number of Steps: 15 Home Layout: One level Home Equipment: Cane - single point (oxygen)      Prior Function Level of Independence: Independent               Hand Dominance        Extremity/Trunk Assessment   Upper Extremity Assessment: Overall WFL for tasks assessed           Lower Extremity Assessment: Generalized weakness      Cervical / Trunk Assessment: Normal  Communication   Communication: No difficulties  Cognition Arousal/Alertness: Awake/alert Behavior During Therapy: WFL for tasks assessed/performed Overall Cognitive Status: Within Functional Limits for tasks assessed                      General Comments      Exercises        Assessment/Plan    PT Assessment Patient needs continued PT services  PT Diagnosis Difficulty walking   PT Problem List Decreased mobility;Decreased activity tolerance  PT Treatment Interventions Gait training;Stair  training;Functional mobility training;Therapeutic activities;Therapeutic exercise;Patient/family education   PT Goals (Current goals can be found in the Care Plan section) Acute Rehab PT Goals Patient Stated Goal: home. pain meds for chest PT Goal Formulation: With patient Time For Goal Achievement: 05/30/14 Potential to Achieve Goals: Good    Frequency Min 3X/week   Barriers to discharge        Co-evaluation               End of Session Equipment Utilized During Treatment: Oxygen Activity Tolerance: Patient tolerated treatment well Patient left: in chair;with call bell/phone within reach           Time: 0908-0919 PT Time Calculation (min): 11 min   Charges:   PT Evaluation $Initial PT Evaluation Tier I: 1 Procedure PT Treatments $Gait Training:  8-22 mins   PT G Codes:          [name], MPT Pager: [phone]

## 2014-05-16 NOTE — Plan of Care (Signed)
Problem: Phase I Progression Outcomes Goal: Pt OOB to Walk or Exercise Daily With Nursing or PT Patient OOB to walk or exercise daily with nursing or PT if activity order permits  Outcome: Completed/Met Date Met:  05/16/14 Goal: Tolerating diet Outcome: Completed/Met Date Met:  05/16/14

## 2014-05-16 NOTE — Progress Notes (Signed)
Pt walked off unit to get fresh air with his tele and oxygen after telling him numerous times he was not allowed off the unit without an order.  Educated pt on importance of staying on unit and the safety risk going off the unit is.  Made staff aware and will continue to monitor.

## 2014-05-16 NOTE — Progress Notes (Signed)
Per Dr. Johnsie Cancel, volume status likely near his baseline despite LE edema. Cr rise likely related to IV diursis, will hold further IV diuresis and allow kidney to recover. Would place patient back on his home lasix tomorrow before possible discharge. Treat COPD exacerbation during the mean time.   Hilbert Corrigan PA Pager: [phone]

## 2014-05-16 NOTE — Progress Notes (Signed)
CARDIOLOGY CONSULT NOTE   Patient ID: Lucas [name] MRN: [id number], DOB/AGE: 1946/04/27   Admit date: 05/13/2014 Date of Consult: 05/16/2014   Primary Physician: Minerva Ends, MD Primary Cardiologist: Lucas [name]  Pt. Profile  68 yo male with PMH of COPD on home oxygen, CAD s/p CABG x5, chronic systolic and diastolic HF with improved EF, HTN, CKD, PVD and h/o a-fib. He has a h/o remote renal cancer s/p nephrectomy present with SOB, CP and cough  Problem List  Past Medical History  Diagnosis Date  . CAD (coronary artery disease)     a. s/p CABG x 5;  b. 04/2012 Cath: patent LIMA->LAD and VG->OM3, all other grafts occluded. c. Cath 12/2013: patent SVG-OM3 & LIMA-LAD, known occ other VG.  Marland Kitchen Hypertension   . Adjustment disorder with anxiety   . Hyperlipidemia     a. Unable to take statins.  Marland Kitchen BPH (benign prostatic hypertrophy)   . COPD (chronic obstructive pulmonary disease)     a. on home O2.  . Arthritis   . Ischemic cardiomyopathy     a. 06/2013 Echo: EF 30-35%, no reg wma's, Gr2 DD, triv AI, mod dil LA, nl RV fxn.  . Chronic systolic CHF (congestive heart failure)     a. 06/2013 Echo: EF 30-35%. b. 11/08/13 EF 40-45%, LVH, HK of the mid-distalanterseptal myocardium, trivial Ao regurg, calcified mitral annulus, RA mildly dilated  . Prostate cancer dx'd 2012  . Renal cancer dx'd 1997    lt nephrectomy  . CKD (chronic kidney disease), stage III   . Tobacco abuse     a. 31 yr hx as of 2015, transitioned to e-cig.  Marland Kitchen Syncope     a. 08/2013: the day following cath, no injury, had received multiple doses of Versed for anxiety - this was felt to be a contributing factor.  Marland Kitchen PAF (paroxysmal atrial fibrillation)     a. on Eliquis 5 mg bid  . Hypotension     a. H/o soft BP prohibiting ACEI/ARB use.  . Chronic pain   . PAD (peripheral artery disease)     a. 12/2013: PV angio s/p angioplasty to R external iliac artery stenosis, also has R SFA occlusion with reconstitution of  above-knee popliteal artery, 2 vessel runoff via peroneal and posterior tib.b. LE doppler 02/2014 ABI right 0.52, left 0.95  . Pulmonary nodules     a. 12/2013:  small pulmonary nodules measuring up to 12mm in RM/LL, f/u recommended 6-12 months.  . Anxiety Dx 2004    Past Surgical History  Procedure Laterality Date  . Coronary artery bypass graft      x 5  . Cardiac catheterization    . Nephrectomy      left nephrectomy for ca  . Posterior cervical laminectomy      x 8   limited ROM  and can't lie flat  . Heart stents      x 5  . Robot assisted laparoscopic radical prostatectomy      for prostate cancer  . Cystoscopy with litholapaxy  05/01/2012    Procedure: CYSTOSCOPY WITH LITHOLAPAXY;  Surgeon: Dutch Gray, MD;  Location: [facility];  Service: Urology;  Laterality: N/A;  . Prostate cancer    . Kidney surgery Left 1994     Allergies  Allergies  Allergen Reactions  . Ativan [Lorazepam] Other (See Comments)    Increases agitation, tolerates xanax  . Lisinopril Cough  . Morphine And Related Other (See Comments)  Hallucinations, too sedated when given with ativan  . Ibuprofen Hives, Nausea And Vomiting and Rash    Tolerates baby aspirin per patient.  There is no allergy to any dose of aspirin. Pt refuses to take 325mg  because it caused nose bleeds.    HPI   Lucas [name] is a 68 yo male with PMH of COPD on home oxygen, CAD s/p CABG x5, chronic systolic and diastolic HF with improved EF, HTN, CKD, PVD and h/o a-fib. He has a h/o remote renal cancer s/p nephrectomy. His COPD likely was related to 42 yrs of tobacco which he said he quit last Nov. His previous cath in 2013 showed patent LIMA to LAD and SVG to OM3, however all other SVG grafts were occluded. He is on multiple pain medication for chronic pain include Norco and Dilaudid. He underwent angioplasty of R external iliac on 6/15 with residual occlusion and collaterals of SFA, however continue to have symptom and will likely need fem  pop. Patient states he has not been very active at home due to severe R sided PVD. He had multiple admission for acute COPD exacerbation and acute on chronic systolic CHF. The last admission was in July 2015, his discharge weight was 217 lbs. The last night he saw Dr Johnsie [name] was on 04/09/2014 at which time he was doing relatively well without significant anginal symptom. He was recently admitted to the hospital on 04/17/2014, he was given nitro which did help and eventually placed on dilaudid and fentanyl. Given his persistent symptom, cardiac cath was repeated under general anesthesia on 04/19/2014. General anesthesia was required for safe sedation given his high narcotic intolerance and severe chronic back pain. Per cath report, his EF was 45%, LVEDP 18, 50% ostial L main, patent LIMA to LAD, severe diseased native mid LAD, patent SVG to OM with mild proximal disease, severe disease in the native left circumflex and its branches, occluded native RCA with left-to-right collateral filling distal RCA. It was recommended the patient continue on medical therapy.   According to the patient, since his his last Month ago, patient has been doing well. However since last Thursday, he has noted increasing swelling in the lower extremity, shortness of breath, and intermittent chest pain. He states the chest pain is improved with narcotic, and he described as a pressure-like sensation over the left substernal area radiating to his back and the jaw. He denies any significant fever, chill. However he has been having frequent cough and wheezing as well. He presented to the hospital on the night of 05/13/2014 with chest pain, shortness breath and coughing.  He states he has been compliant with his diuretic medication at home. He was admitted by internal medicine group. Serial troponin was negative. Initial proBNP was borderline at 697. Chest x-ray showed COPD with no acute abnormality. CTA of the chest was obtained which showed no  acute cardio pulmonary disease and no evidence of PE. He also showed mild emphysematous disease with several small stable right lung nodule in the right lower lobe.echocardiogram was obtained on 11/3 which showed EF of 16-38%, grade 1 diastolic dysfunction, mild AR, normal RV systolic function. Per internal medicine group, was initially felt that his chest pain is atypical and likely pleuritic in nature. he has been treated for both COPD exacerbation and acute diastolic heart failure with aggressive diuresis.patient's creatinine went up from 1.3-2.1 despite only 1.4 L output. Cardiology has been consulted for chest pain and acute diastolic heart failure.  Inpatient Medications  .  antiseptic oral rinse  7 mL Mouth Rinse BID  . apixaban  5 mg Oral BID  . aspirin EC  81 mg Oral Daily  . atorvastatin  40 mg Oral q1800  . azithromycin  250 mg Oral Daily  . doxazosin  8 mg Oral QHS  . furosemide  80 mg Intravenous 3 times per day  . guaiFENesin  1,200 mg Oral BID  . ipratropium-albuterol  3 mL Nebulization QID  . irbesartan  37.5 mg Oral Daily  . isosorbide mononitrate  60 mg Oral Daily  . methylPREDNISolone (SOLU-MEDROL) injection  60 mg Intravenous Q12H  . nicotine  21 mg Transdermal Daily  . pantoprazole  40 mg Oral Q1200  . sodium chloride  3 mL Intravenous Q12H    Family History Family History  Problem Relation Age of Onset  . Diabetes Mother   . Heart disease Mother   . Hypertension Mother   . Heart attack Mother   . Coronary artery disease    . Heart disease Father   . Diabetes Father   . Heart attack Father   . Heart disease Brother   . Heart attack Brother   . COPD Sister      Social History History   Social History  . Marital Status: Widowed    Spouse Name: N/A    Number of Children: N/A  . Years of Education: N/A   Occupational History  . disabled    Social History Main Topics  . Smoking status: Former Smoker -- 0.30 packs/day for 54 years    Types: Cigarettes      Quit date: 02/04/2014  . Smokeless tobacco: Former Systems developer    Quit date: 04/19/2012     Comment: Using e-cig now  . Alcohol Use: No  . Drug Use: No  . Sexual Activity: Not Currently   Other Topics Concern  . Not on file   Social History Narrative    He lives in [place] by himself.  He is a widower.    He continues to smoke about 4 or 5  cigarettes a day but has a greater than 100 pack-year history having  previously smoked up to 3-4 packs a day over the span about 48 years.     He denies alcohol or drug use.  He is retired secondary to disability     since the 11s.      Review of Systems  General:  No chills, fever, night sweats. Increased weight.  Cardiovascular:  +chest pain, dyspnea on exertion, edema, orthopnea, palpitations, paroxysmal nocturnal dyspnea. Dermatological: No rash, lesions/masses Respiratory: +cough, dyspnea Urologic: No hematuria, dysuria Abdominal:   No nausea, vomiting, diarrhea, bright red blood per rectum, melena, or hematemesis Neurologic:  No visual changes, wkns, changes in mental status. All other systems reviewed and are otherwise negative except as noted above.  Physical Exam  Blood pressure 140/71, pulse 84, temperature 97.9 F (36.6 C), temperature source Oral, resp. rate 20, height 5' 8.5" (1.74 m), weight 226 lb 6.6 oz (102.7 kg), SpO2 90 %.  General: Pleasant, NAD Psych: Normal affect. Neuro: Alert and oriented X 3. Moves all extremities spontaneously. HEENT: Normal  Neck: Supple without bruits. Difficult to assess JVD with fat tissue around neck Lungs:  Resp regular and unlabored, prolonged expiratory phase with wheezing Heart: RRR no s3, s4, or murmurs. Abdomen: Soft, non-tender, BS + x 4. Distended Extremities: No clubbing, cyanosis. DP/PT/Radials 2+ and equal bilaterally. 2-3+ pitting edema  Labs   Recent Labs  05/13/14 2140 05/14/14 0305 05/14/14 0837 05/14/14 1434  TROPONINI <0.30 <0.30 <0.30 <0.30   Lab Results   Component Value Date   WBC 7.9 05/14/2014   HGB 12.6* 05/14/2014   HCT 37.8* 05/14/2014   MCV 92.6 05/14/2014   PLT 176 05/14/2014    Recent Labs Lab 05/14/14 0305  05/16/14 0501  NA 139  < > 137  K 3.3*  < > 4.9  CL 99  < > 96  CO2 24  < > 26  BUN 26*  < > 64*  CREATININE 1.30  < > 2.11*  CALCIUM 9.7  < > 9.8  PROT 7.1  --   --   BILITOT 0.3  --   --   ALKPHOS 56  --   --   ALT 19  --   --   AST 18  --   --   GLUCOSE 110*  < > 144*  < > = values in this interval not displayed. Lab Results  Component Value Date   CHOL 208* 07/01/2013   HDL 34* 07/01/2013   LDLCALC 145* 07/01/2013   TRIG 144 07/01/2013   Lab Results  Component Value Date   DDIMER 0.72* 07/01/2013    Radiology/Studies  Dg Chest 2 View  05/13/2014   CLINICAL DATA:  Strain shortness of breath and chest pain intensifying over several days. History of COPD and CABG. History of paroxysmal atrial fibrillation.  EXAM: CHEST  2 VIEW  COMPARISON:  04/17/2014.  FINDINGS: The cardiac silhouette is near the upper limit of normal in size with an interval decrease in size. Increased prominence of the pulmonary vasculature. The interstitial markings remain mildly prominent with no pleural fluid seen. The hemidiaphragms are flattened and central peribronchial thickening is again demonstrated. Thoracic spine degenerative changes and changes of DISH. Upper abdominal and gastroesophageal junction region surgical clips.  IMPRESSION: No acute abnormality.  Changes of COPD and chronic bronchitis.   Electronically Signed   By: Enrique Sack M.D.   On: 05/13/2014 21:53   Dg Pelvis 1-2 Views  04/17/2014   CLINICAL DATA:  Chronic left hip pain, with difficulty walking. Initial encounter.  EXAM: PELVIS - 1-2 VIEW  COMPARISON:  CT of the abdomen and pelvis performed 08/22/2013, and abdominal radiograph performed 04/27/2012  FINDINGS: There is no evidence of fracture or dislocation. Both femoral heads are seated normally within their  respective acetabula. No significant degenerative change is appreciated. The sacroiliac joints are unremarkable in appearance.  The visualized bowel gas pattern is grossly unremarkable in appearance.  IMPRESSION: No evidence of fracture or dislocation. No significant degenerative change seen. Joint spaces are preserved.   Electronically Signed   By: Garald Balding M.D.   On: 04/17/2014 03:19   Ct Angio Chest Pe W/cm &/or Wo Cm  05/14/2014   CLINICAL DATA:  Cough and shortness of breath 5 days with anterior chest pain. Rule out pulmonary embolism.  EXAM: CT ANGIOGRAPHY CHEST WITH CONTRAST  TECHNIQUE: Multidetector CT imaging of the chest was performed using the standard protocol during bolus administration of intravenous contrast. Multiplanar CT image reconstructions and MIPs were obtained to evaluate the vascular anatomy.  CONTRAST:  11mL OMNIPAQUE IOHEXOL 350 MG/ML SOLN  COMPARISON:  01/07/2014 as well as abdominal CT 08/22/2013.  FINDINGS: Lungs are adequately inflated with mild centrilobular emphysematous disease. There are 4 small peripheral right lung nodules with the largest measuring approximately 6 mm over the posterior right lower lobe as these are all unchanged. There is  no focal consolidation or effusion. Airways are within normal.  Heart is normal in size. Mild calcified plaque over the coronary arteries. Evidence of prior median sternotomy. No definite pulmonary emboli are seen, although the pulmonary arterial contrast density is not optimal as a small peripheral embolus may not be visualized on this exam. There is minimal calcified plaque involving the thoracic aorta. There is no mediastinal or hilar adenopathy.  Images through the upper abdomen demonstrate evidence of a prior left nephrectomy. There is a sub cm hypodensity over the mid to upper pole cortex of the right kidney likely a cyst but too small to characterize as this is unchanged. There are degenerative changes of the spine.  Review of  the MIP images confirms the above findings.  IMPRESSION: No acute cardiopulmonary disease and no definite evidence of pulmonary embolism.  Mild emphysematous disease with several small stable right lung nodules with the largest measuring 6 mm over the right lower lobe. Recommend followup CT in 1 year. This recommendation follows the consensus statement: Guidelines for Management of Small Pulmonary Nodules Detected on CT Scans: A Statement from the [place] as published in Radiology 2005; 237:395-400. Online at: https://www.arnold.com/.  Sub cm hypodensity over the mid to upper pole right kidney unchanged and likely cysts too small to characterize.   Electronically Signed   By: Marin Olp M.D.   On: 05/14/2014 10:30   Dg Chest Port 1 View  04/17/2014   CLINICAL DATA:  Initial encounter for severe chest pain.  EXAM: PORTABLE CHEST - 1 VIEW  COMPARISON:  Two-view chest 03/09/2014.  FINDINGS: Heart is mildly enlarged. Lung volumes are low. Mild bibasilar airspace disease likely reflects atelectasis. The patient is status post median sternotomy for CABG. Mild interstitial prominence is increased.  IMPRESSION: 1. Mild cardiac enlargement and increasing interstitial prominence suggesting mild edema. 2. Mild bibasilar airspace disease likely reflects atelectasis. Early infection is considered less likely.   Electronically Signed   By: Lawrence Santiago M.D.   On: 04/17/2014 01:28    ECG  NSR with poor R wave progression in anterior lead, no significant ST-T wave changes  ASSESSMENT AND PLAN  1. Chest pain:  - serial trop negative, no ischemic changes on EKG  - recent cath reassuring, does have 50% L main dx, however patent LIMA to LAD and SVG to OM3, occluded native RCA with L to R collateral  - Echo looks reassuring, low probability of ACS  2. Acute COPD exacerbation  - on home O2  - continue abx, steroid and breathing treatment per primary team  3. ?Acute  diastolic HF  - previous had chronic systolic HF, however EF improved to 53%  - conflicting signs on exam, no rale in lung, CT of chest did not reveal significant pulm edema, proBNP only 600. However has significant 2-3+ edema in LE, no sign of RV failure based on echo result. TSH normal 2 month ago  - Cr worsened from 1.3 to 2.11 after aggressive diuresis. ?how much CHF contribute to his SOB   4. CAD s/p CABG x5 5. Acute on chronic renal insufficiency 6. HTN 7. PVD  8. h/o a-fib, on apixaban   Signed, Almyra Deforest, PA-C 05/16/2014, 1:32 PM

## 2014-05-16 NOTE — Progress Notes (Signed)
[facility] PROGRESS NOTE  GINO GARRABRANT LNL:[id number] DOB: Apr 17, 1946 DOA: 05/13/2014 PCP: Minerva Ends, MD   Brief narrative 68 year old obese male with history of systolic and diastolic CHF, coronary artery disease with recent unstable angina status post cardiac cath without intervention and recommended for medical management, chronic kidney disease stage II, hypertension, COPD, A. Fib on anticoagulation presented with chest pain with shortness of breath and cough. Patient admitted with COPD exacerbation and acute CHF exacerbation.  Assessment/Plan: Acute on chronic hypoxic respiratory failure secondary to COPD exacerbation and acute diastolic CHF Still  wheezy and edematous placed on IV lasix 80 mg bid , and further increase to 80 tid. 2 d echo with normal EF ( was 45% previously) with diastolic dysfn) Monitor strict I/O and daily weights -CT angiogram of the chest negative for PE. -cardiology consulted given persistent CHF.  COPD exacerbation Continue with IV Solu-Medrol,(reduce to 60 mg every 12 hours) DuoNeb nebs and when necessary albuterol neb Continue azithromycin and antitussives  Acute diastolic CHF As outlined above.  Coronary artery disease with recent unstable angina Patient underwent cardiac cath on 10/9/2015with moderate disease of the left main coronary artery, severe native mid left anterior descending artery disease and severe disease of the native left circumflex artery and is branches. Recommended for medical management Patient reports off and on chest pain . Serial troponin have been negative. Stable on telemetry. Follow repeat EKG Continue aspirin, Eliquis, sublingual nitrate as needed.increased Imdur dose to 60 mg.  2-D echo with improved EF Started on statin.  Acute on Chronic kidney disease stage II Worsened renal function likely in the setting of IV Lasix. Has improved UOP. Monitor for now.  Peripheral vascular disease Continue  aspirin  DVT prophylaxis: On Eliquis    Code Status:full code Family Communication: none at bedside Disposition Plan: home with home health once improved   Consultants:  none  Procedures:  none  Antibiotics:  azithromycin ( 11/ 3--)  HPI/Subjective: Patient complains of substernal chest pain off and on. Reports breathing to be slightly better  Objective: Filed Vitals:   05/16/14 0444  BP: 140/71  Pulse: 84  Temp: 97.9 F (36.6 C)  Resp: 20    Intake/Output Summary (Last 24 hours) at 05/16/14 1440 Last data filed at 05/16/14 0831  Gross per 24 hour  Intake   1320 ml  Output   2150 ml  Net   -830 ml   Filed Weights   05/14/14 0254 05/15/14 0304 05/16/14 0444  Weight: 100.835 kg (222 lb 4.8 oz) 102.876 kg (226 lb 12.8 oz) 102.7 kg (226 lb 6.6 oz)    Exam:   General: elderly male in no acute distress  HEENT: Moist oral mucosa  Chest: Diffuse bilateral wheezing and fine basilar Crackles  Cardiovascular:normal S1 and S2, no murmurs  Abdomen: Mildly distended and tense, nontender,   Extremities: 2+ pitting edema bilaterally  CNS: Alert and oriented  Data Reviewed: Basic Metabolic Panel:  Recent Labs Lab 05/13/14 2140 05/14/14 0305 05/15/14 0950 05/16/14 0501  NA 145 139 137 137  K 4.1 3.3* 4.9 4.9  CL 102 99 96 96  CO2 24 24 25 26   GLUCOSE 105* 110* 179* 144*  BUN 22 26* 45* 64*  CREATININE 1.32 1.30 1.80* 2.11*  CALCIUM 10.0 9.7 9.7 9.8  MG  --  2.1  --   --    Liver Function Tests:  Recent Labs Lab 05/14/14 0305  AST 18  ALT 19  ALKPHOS 56  BILITOT  0.3  PROT 7.1  ALBUMIN 3.9   No results for input(s): LIPASE, AMYLASE in the last 168 hours. No results for input(s): AMMONIA in the last 168 hours. CBC:  Recent Labs Lab 05/13/14 2140 05/14/14 0305  WBC 7.2 7.9  NEUTROABS 5.2  --   HGB 13.4 12.6*  HCT 40.5 37.8*  MCV 92.7 92.6  PLT 186 176   Cardiac Enzymes:  Recent Labs Lab 05/13/14 2140 05/14/14 0305  05/14/14 0837 05/14/14 1434  TROPONINI <0.30 <0.30 <0.30 <0.30   BNP (last 3 results)  Recent Labs  03/20/14 0906 04/17/14 0049 05/13/14 2140  PROBNP 185.0* 1368.0* 697.9*   CBG: No results for input(s): GLUCAP in the last 168 hours.  Recent Results (from the past 240 hour(s))  Culture, sputum-assessment     Status: None   Collection Time: 05/14/14  6:14 AM  Result Value Ref Range Status   Specimen Description SPUTUM  Final   Special Requests NONE  Final   Sputum evaluation   Final    THIS SPECIMEN IS ACCEPTABLE. RESPIRATORY CULTURE REPORT TO FOLLOW.   Report Status 05/14/2014 FINAL  Final  Culture, respiratory (NON-Expectorated)     Status: None (Preliminary result)   Collection Time: 05/14/14  6:14 AM  Result Value Ref Range Status   Specimen Description SPUTUM  Final   Special Requests NONE  Final   Gram Stain   Final    MODERATE WBC PRESENT, PREDOMINANTLY PMN RARE SQUAMOUS EPITHELIAL CELLS PRESENT MODERATE GRAM NEGATIVE COCCI FEW GRAM POSITIVE COCCI IN PAIRS RARE GRAM POSITIVE RODS    Culture   Final    NORMAL OROPHARYNGEAL FLORA Performed at [facility]    Report Status PENDING  Incomplete     Studies: No results found.  Scheduled Meds: . antiseptic oral rinse  7 mL Mouth Rinse BID  . apixaban  5 mg Oral BID  . aspirin EC  81 mg Oral Daily  . atorvastatin  40 mg Oral q1800  . azithromycin  250 mg Oral Daily  . doxazosin  8 mg Oral QHS  . furosemide  80 mg Intravenous 3 times per day  . guaiFENesin  1,200 mg Oral BID  . ipratropium-albuterol  3 mL Nebulization QID  . irbesartan  37.5 mg Oral Daily  . isosorbide mononitrate  60 mg Oral Daily  . methylPREDNISolone (SOLU-MEDROL) injection  60 mg Intravenous Q12H  . nicotine  21 mg Transdermal Daily  . pantoprazole  40 mg Oral Q1200  . sodium chloride  3 mL Intravenous Q12H   Continuous Infusions:     Time spent: 35 minutes    Ciena Sampley, [place]  [facility] Pager [phone]. If  7PM-7AM, please contact night-coverage at [email or web address], password [facility] 05/16/2014, 2:40 PM  LOS: 3 days

## 2014-05-17 DIAGNOSIS — I5033 Acute on chronic diastolic (congestive) heart failure: Secondary | ICD-10-CM | POA: Diagnosis present

## 2014-05-17 LAB — BASIC METABOLIC PANEL
ANION GAP: 12 (ref 5–15)
BUN: 68 mg/dL — AB (ref 6–23)
CALCIUM: 9.6 mg/dL (ref 8.4–10.5)
CHLORIDE: 95 meq/L — AB (ref 96–112)
CO2: 30 mEq/L (ref 19–32)
Creatinine, Ser: 1.83 mg/dL — ABNORMAL HIGH (ref 0.50–1.35)
GFR calc Af Amer: 42 mL/min — ABNORMAL LOW (ref >90)
GFR, EST NON AFRICAN AMERICAN: 36 mL/min — AB (ref >90)
Glucose, Bld: 149 mg/dL — ABNORMAL HIGH (ref 70–99)
Potassium: 4.3 mEq/L (ref 3.7–5.3)
Sodium: 137 mEq/L (ref 137–147)

## 2014-05-17 LAB — CULTURE, RESPIRATORY

## 2014-05-17 LAB — CULTURE, RESPIRATORY W GRAM STAIN

## 2014-05-17 MED ORDER — GUAIFENESIN ER 600 MG PO TB12: 1200.0000 mg | ORAL_TABLET | Freq: Two times a day (BID) | ORAL | Status: DC

## 2014-05-17 MED ORDER — ISOSORBIDE MONONITRATE ER 30 MG PO TB24: 60.0000 mg | ORAL_TABLET | Freq: Every day | ORAL | Status: DC

## 2014-05-17 MED ORDER — AZITHROMYCIN 250 MG PO TABS: ORAL_TABLET | ORAL | Status: AC

## 2014-05-17 MED ORDER — HYDROCODONE-ACETAMINOPHEN 10-325 MG PO TABS: 1.0000 | ORAL_TABLET | Freq: Every day | ORAL | Status: DC

## 2014-05-17 MED ORDER — HYDROMORPHONE HCL 4 MG PO TABS: 4.0000 mg | ORAL_TABLET | Freq: Four times a day (QID) | ORAL | Status: DC | PRN

## 2014-05-17 MED ORDER — GUAIFENESIN-DM 100-10 MG/5ML PO SYRP: 5.0000 mL | ORAL_SOLUTION | ORAL | Status: DC | PRN

## 2014-05-17 MED ORDER — ATORVASTATIN CALCIUM 40 MG PO TABS: 40.0000 mg | ORAL_TABLET | Freq: Every day | ORAL | Status: DC

## 2014-05-17 MED ORDER — PREDNISONE 20 MG PO TABS: 20.0000 mg | ORAL_TABLET | Freq: Every day | ORAL | Status: DC

## 2014-05-17 NOTE — Progress Notes (Signed)
Patient ID: Lucas [name], male   DOB: 1946-05-31, 68 y.o.   MRN: [id number]    SUBJECTIVE: creatinine is down today from 2.1 to 1.8. He is seen today to follow-up CHF. He's feeling better. He wears oxygen at nighttime at home. He helps to be a little home today. He was seen by Dr. Johnsie Cancel yesterday, his regular cardiologist.   Danley Danker Vitals:   05/16/14 1500 05/16/14 2040 05/16/14 2204 05/17/14 0530  BP: 147/80  146/71 134/71  Pulse:   76 85  Temp: 97.9 F (36.6 C)  97.6 F (36.4 C) 97.7 F (36.5 C)  TempSrc: Oral  Oral Oral  Resp:   22 20  Height:      Weight:    223 lb 15.8 oz (101.6 kg)  SpO2: 93% 95% 92% 96%     Intake/Output Summary (Last 24 hours) at 05/17/14 0726 Last data filed at 05/16/14 2322  Gross per 24 hour  Intake   1280 ml  Output    800 ml  Net    480 ml    LABS: Basic Metabolic Panel:  Recent Labs  05/16/14 0501 05/17/14 0500  NA 137 137  K 4.9 4.3  CL 96 95*  CO2 26 30  GLUCOSE 144* 149*  BUN 64* 68*  CREATININE 2.11* 1.83*  CALCIUM 9.8 9.6   Liver Function Tests: No results for input(s): AST, ALT, ALKPHOS, BILITOT, PROT, ALBUMIN in the last 72 hours. No results for input(s): LIPASE, AMYLASE in the last 72 hours. CBC: No results for input(s): WBC, NEUTROABS, HGB, HCT, MCV, PLT in the last 72 hours. Cardiac Enzymes:  Recent Labs  05/14/14 0837 05/14/14 1434  TROPONINI <0.30 <0.30   BNP: Invalid input(s): POCBNP D-Dimer: No results for input(s): DDIMER in the last 72 hours. Hemoglobin A1C: No results for input(s): HGBA1C in the last 72 hours. Fasting Lipid Panel: No results for input(s): CHOL, HDL, LDLCALC, TRIG, CHOLHDL, LDLDIRECT in the last 72 hours. Thyroid Function Tests: No results for input(s): TSH, T4TOTAL, T3FREE, THYROIDAB in the last 72 hours.  Invalid input(s): FREET3  RADIOLOGY: Dg Chest 2 View  05/13/2014   CLINICAL DATA:  Strain shortness of breath and chest pain intensifying over several days. History of COPD  and CABG. History of paroxysmal atrial fibrillation.  EXAM: CHEST  2 VIEW  COMPARISON:  04/17/2014.  FINDINGS: The cardiac silhouette is near the upper limit of normal in size with an interval decrease in size. Increased prominence of the pulmonary vasculature. The interstitial markings remain mildly prominent with no pleural fluid seen. The hemidiaphragms are flattened and central peribronchial thickening is again demonstrated. Thoracic spine degenerative changes and changes of DISH. Upper abdominal and gastroesophageal junction region surgical clips.  IMPRESSION: No acute abnormality.  Changes of COPD and chronic bronchitis.   Electronically Signed   By: Enrique Sack M.D.   On: 05/13/2014 21:53   Ct Angio Chest Pe W/cm &/or Wo Cm  05/14/2014   CLINICAL DATA:  Cough and shortness of breath 5 days with anterior chest pain. Rule out pulmonary embolism.  EXAM: CT ANGIOGRAPHY CHEST WITH CONTRAST  TECHNIQUE: Multidetector CT imaging of the chest was performed using the standard protocol during bolus administration of intravenous contrast. Multiplanar CT image reconstructions and MIPs were obtained to evaluate the vascular anatomy.  CONTRAST:  168mL OMNIPAQUE IOHEXOL 350 MG/ML SOLN  COMPARISON:  01/07/2014 as well as abdominal CT 08/22/2013.  FINDINGS: Lungs are adequately inflated with mild centrilobular emphysematous disease. There are 4 small  peripheral right lung nodules with the largest measuring approximately 6 mm over the posterior right lower lobe as these are all unchanged. There is no focal consolidation or effusion. Airways are within normal.  Heart is normal in size. Mild calcified plaque over the coronary arteries. Evidence of prior median sternotomy. No definite pulmonary emboli are seen, although the pulmonary arterial contrast density is not optimal as a small peripheral embolus may not be visualized on this exam. There is minimal calcified plaque involving the thoracic aorta. There is no mediastinal or  hilar adenopathy.  Images through the upper abdomen demonstrate evidence of a prior left nephrectomy. There is a sub cm hypodensity over the mid to upper pole cortex of the right kidney likely a cyst but too small to characterize as this is unchanged. There are degenerative changes of the spine.  Review of the MIP images confirms the above findings.  IMPRESSION: No acute cardiopulmonary disease and no definite evidence of pulmonary embolism.  Mild emphysematous disease with several small stable right lung nodules with the largest measuring 6 mm over the right lower lobe. Recommend followup CT in 1 year. This recommendation follows the consensus statement: Guidelines for Management of Small Pulmonary Nodules Detected on CT Scans: A Statement from the [employer] as published in Radiology 2005; 237:395-400. Online at: https://www.arnold.com/.  Sub cm hypodensity over the mid to upper pole right kidney unchanged and likely cysts too small to characterize.   Electronically Signed   By: Marin Olp M.D.   On: 05/14/2014 10:30    PHYSICAL EXAM  Patient is oriented to person time and place. Affect is normal. He is overweight. He has kyphosis of the thoracic spine. He is wearing oxygen. Lung exam reveals a few scattered rhonchi. Cardiac exam reveals an S1 and S2. Abdomen is soft. He does have 1+ peripheral edema.   TELEMETRY: I have reviewed telemetry today May 17, 2014. There is normal sinus rhythm.   ASSESSMENT AND PLAN:    COPD exacerbation     This has been treated during this hospitalization.    Chronic kidney disease, stage 2, mildly decreased GFR     Renal function is slightly better today. He is stable for discharge from this viewpoint.    CHF (congestive heart failure)     His overall volume status is stable for him. He does have mild chronic peripheral edema.    CAD (coronary artery disease) of artery bypass graft     Coronary disease is stable.        Chronic anticoagulation     He is on chronic anticoagulation for paroxysmal atrial fibrillation.  From the cardiology viewpoint he can be discharged home today on his usual dose of home oral diuretics.   Dola Argyle 05/17/2014 7:26 AM

## 2014-05-17 NOTE — Discharge Summary (Signed)
Physician Discharge Summary  Lucas [name]:[id number] DOB: 03-24-1946 DOA: 05/13/2014  PCP: Minerva Ends, MD  Admit date: 05/13/2014 Discharge date: 05/17/2014  Time spent: 35 minutes  Recommendations for Outpatient Follow-up:  #1 Discharge home with [facility] #2 Patient will follow-up with his PCP in one week (reports he has an appointment in near future). Please check renal function during outpatient visit. Please resume valsartan if renal function has improved. #3 Patient has follow-up with vascular surgery and pulmonology in the next 2 weeks  Discharge Diagnoses:  Principal Problem:   COPD exacerbation  Active Problems:   Acute on chronic diastolic heart failure   Acute-on-chronic kidney injury   ADENOCARCINOMA, PROSTATE   Essential hypertension   OSA (obstructive sleep apnea)   Chronic pain   PAF (paroxysmal atrial fibrillation)   Chronic systolic CHF (congestive heart failure)   Chronic kidney disease, stage 2, mildly decreased GFR   CHF (congestive heart failure)   CAD (coronary artery disease) of artery bypass graft   Chest pain   Chronic anticoagulation    Discharge Condition: fair  Diet recommendation: heart healthy  Filed Weights   05/15/14 0304 05/16/14 0444 05/17/14 0530  Weight: 102.876 kg (226 lb 12.8 oz) 102.7 kg (226 lb 6.6 oz) 101.6 kg (223 lb 15.8 oz)    History of present illness:  Please refer to admission H&P for details but in brief, 68 year old obese male with history of systolic and diastolic CHF, coronary artery disease with recent unstable angina status post cardiac cath without intervention and recommended for medical management, chronic kidney disease stage II, hypertension, COPD, A. Fib on anticoagulation , peripheral vascular disease,presented with chest pain with shortness of breath and cough. Patient admitted with COPD exacerbation and acute CHF exacerbation.   Hospital Course:  Acute on chronic hypoxic respiratory failure  secondary to COPD exacerbation and acute diastolic CHF -clinically improved and maintaining O2 sat on 2 L. -diet he is aggressively with IV Lasix. 2 d echo with normal EF ( was 45% previously) with diastolic dysfn) -symptoms much better and diuresed well. -CT angiogram of the chest negative for PE on admission. -cardiology consulted given persistent CHF. Recommended that he has chronic edema and symptoms are improving with IV Lasix. Now switched to his home dose of oral Lasix. -patient is clinically stable and at baseline with his respiratory function.  COPD exacerbation Patient given IV Solu-Medrol, scheduled for nebs and as needed albuterol nebs. Symptoms much improved. He is on continuous home oxygen. I will discharge him on a short oral prednisone taper over next 12 days. He should continue with his home nebulizers and inhaler. Continue azithromycin (for a total of 5 day duration) and antitussives  Acute diastolic CHF As outlined above.diabetes well and now switched to oral Lasix. Hold home dose valsartan given worsened renal function and can be resumed if renal function improved during outpatient follow-up.  Coronary artery disease with recent unstable angina Patient underwent cardiac cath on 10/9/2015with moderate disease of the left main coronary artery, severe native mid left anterior descending artery disease and severe disease of the native left circumflex artery and is branches. Recommended for medical management Patient reports off and on chest pain . Serial troponin have been negative. Stable on telemetry.Continue aspirin, Eliquis, sublingual nitrate as needed.increased Imdur dose to 60 mg. 2-D echo with improved EF Started on statin. -patient's chest pain appears to be pleuritic and noncardiac. He is on scheduled Vicodin and when necessary Dilaudid for his chronic pain which will  be continued. (I have given him a prescription of his pain medications for a short duration as a  reported running out of it, until he sees his PCP)   Acute on Chronic kidney disease stage II Worsened renal function likely in the setting of IV Lasix. Improved urine output and renal function slowly improved in a.m. Labs once patient switched to by mouth Lasix. I will hold his valsartan and if his renal function is improving during outpatient evaluation he should be placed back on it.  Peripheral vascular disease Continue aspirin. Patient has appointment with Dr. Trula Slade next week.   Paroxysmal A. Fib Stable. Continue Eliquis   Code Status:full code  Family Communication: none at bedside  Disposition Plan: home with home health    Consultants:  none  Procedures:  none  Antibiotics:  azithromycin ( 11/ 3--)    Discharge Exam: Filed Vitals:   05/17/14 0530  BP: 134/71  Pulse: 85  Temp: 97.7 F (36.5 C)  Resp: 20    General: elderly obese male in no acute distress HEENT: Moist oral mucosa, Cardiovascular: normal S1 and S2, no murmurs Chest: Equal air entry bilaterally, no rhonchi or wheeze Abdomen: Soft, mildly distended, nontender, bowel sounds present Extremities: 1+ pitting edema bilaterally CNS: Alert and oriented   Discharge Instructions You were cared for by a hospitalist during your hospital stay. If you have any questions about your discharge medications or the care you received while you were in the hospital after you are discharged, you can call the unit and asked to speak with the hospitalist on call if the hospitalist that took care of you is not available. Once you are discharged, your primary care physician will handle any further medical issues. Please note that NO REFILLS for any discharge medications will be authorized once you are discharged, as it is imperative that you return to your primary care physician (or establish a relationship with a primary care physician if you do not have one) for your aftercare needs so that they can reassess your  need for medications and monitor your lab values.   Current Discharge Medication List    START taking these medications   Details  atorvastatin (LIPITOR) 40 MG tablet Take 1 tablet (40 mg total) by mouth daily at 6 PM. Qty: 30 tablet, Refills: 0    azithromycin (ZITHROMAX) 250 MG tablet 1 TABLET DAILY FOR 2 DAYS Qty: 2 tablet, Refills: 0    guaiFENesin (MUCINEX) 600 MG 12 hr tablet Take 2 tablets (1,200 mg total) by mouth 2 (two) times daily. Qty: 10 tablet, Refills: 0    guaiFENesin-dextromethorphan (ROBITUSSIN DM) 100-10 MG/5ML syrup Take 5 mLs by mouth every 4 (four) hours as needed for cough. Qty: 118 mL, Refills: 0      CONTINUE these medications which have CHANGED   Details  HYDROcodone-acetaminophen (NORCO) 10-325 MG per tablet Take 1 tablet by mouth 5 (five) times daily. Qty: 40 tablet, Refills: 0    HYDROmorphone (DILAUDID) 4 MG tablet Take 1 tablet (4 mg total) by mouth every 6 (six) hours as needed for severe pain. Qty: 30 tablet, Refills: 0   Associated Diagnoses: Chronic pain    isosorbide mononitrate (IMDUR) 30 MG 24 hr tablet Take 2 tablets (60 mg total) by mouth daily. Qty: 60 tablet, Refills: 1    predniSONE (DELTASONE) 20 MG tablet Take 1 tablet (20 mg total) by mouth daily with breakfast. Qty: 16 tablet, Refills: 0      CONTINUE these medications which  have NOT CHANGED   Details  albuterol (PROVENTIL HFA;VENTOLIN HFA) 108 (90 BASE) MCG/ACT inhaler Inhale 2 puffs into the lungs every 6 (six) hours as needed for wheezing or shortness of breath. Shortness of breath Qty: 1 Inhaler, Refills: 5    albuterol (PROVENTIL) (2.5 MG/3ML) 0.083% nebulizer solution Take 3 mLs (2.5 mg total) by nebulization 4 (four) times daily. Qty: 75 mL, Refills: 5    ALPRAZolam (XANAX) 1 MG tablet Take 1 tablet (1 mg total) by mouth 3 (three) times daily as needed for anxiety. Qty: 30 tablet, Refills: 1    apixaban (ELIQUIS) 5 MG TABS tablet Take 1 tablet (5 mg total) by mouth 2  (two) times daily. Qty: 60 tablet, Refills: 1   Associated Diagnoses: PVD (peripheral vascular disease)    aspirin EC 81 MG tablet Take 81 mg by mouth daily.    doxazosin (CARDURA) 8 MG tablet Take 8 mg by mouth at bedtime.     furosemide (LASIX) 20 MG tablet Take 40 mg by mouth 2 (two) times daily.     ipratropium (ATROVENT) 0.02 % nebulizer solution Take 0.5 mg by nebulization 4 (four) times daily.    Ipratropium-Albuterol (COMBIVENT RESPIMAT) 20-100 MCG/ACT AERS respimat Inhale 1 puff into the lungs 4 (four) times daily. Qty: 1 Inhaler, Refills: 5    mometasone-formoterol (DULERA) 100-5 MCG/ACT AERO Inhale 2 puffs into the lungs 2 (two) times daily. Qty: 1 Inhaler, Refills: 0    nitroGLYCERIN (NITROSTAT) 0.3 MG SL tablet Place 0.3 mg under the tongue every 5 (five) minutes as needed for chest pain.    pantoprazole (PROTONIX) 40 MG tablet Take 1 tablet (40 mg total) by mouth daily at 12 noon. Qty: 30 tablet, Refills: 1    potassium chloride SA (K-DUR,KLOR-CON) 20 MEQ tablet Take 1 tablet (20 mEq total) by mouth daily. Qty: 30 tablet, Refills: 1   Associated Diagnoses: Hypokalemia      STOP taking these medications     valsartan (DIOVAN) 40 MG tablet        Allergies  Allergen Reactions  . Ativan [Lorazepam] Other (See Comments)    Increases agitation, tolerates xanax  . Lisinopril Cough  . Morphine And Related Other (See Comments)    Hallucinations, too sedated when given with ativan  . Ibuprofen Hives, Nausea And Vomiting and Rash    Tolerates baby aspirin per patient.  There is no allergy to any dose of aspirin. Pt refuses to take 325mg  because it caused nose bleeds.   Follow-up Information    Follow up with Minerva Ends, MD. Schedule an appointment as soon as possible for a visit in 1 week.   Specialty:  Family Medicine   Contact information:   [place] [address] [phone]       Follow up with Eldridge Abrahams, MD  On 05/27/2014.   Specialty:  Vascular Surgery   Why:  8:30 AM   Contact information:   [address] [place] [address] [phone]       Follow up with [facility], MD On 06/10/2014.   Specialty:  Pulmonary Disease   Why:  9:15 AM   Contact information:   [place] [address] [phone]        The results of significant diagnostics from this hospitalization (including imaging, microbiology, ancillary and laboratory) are listed below for reference.    Significant Diagnostic Studies: Dg Chest 2 View  05/13/2014   CLINICAL DATA:  Strain shortness  of breath and chest pain intensifying over several days. History of COPD and CABG. History of paroxysmal atrial fibrillation.  EXAM: CHEST  2 VIEW  COMPARISON:  04/17/2014.  FINDINGS: The cardiac silhouette is near the upper limit of normal in size with an interval decrease in size. Increased prominence of the pulmonary vasculature. The interstitial markings remain mildly prominent with no pleural fluid seen. The hemidiaphragms are flattened and central peribronchial thickening is again demonstrated. Thoracic spine degenerative changes and changes of DISH. Upper abdominal and gastroesophageal junction region surgical clips.  IMPRESSION: No acute abnormality.  Changes of COPD and chronic bronchitis.   Electronically Signed   By: Enrique Sack M.D.   On: 05/13/2014 21:53   Ct Angio Chest Pe W/cm &/or Wo Cm  05/14/2014   CLINICAL DATA:  Cough and shortness of breath 5 days with anterior chest pain. Rule out pulmonary embolism.  EXAM: CT ANGIOGRAPHY CHEST WITH CONTRAST  TECHNIQUE: Multidetector CT imaging of the chest was performed using the standard protocol during bolus administration of intravenous contrast. Multiplanar CT image reconstructions and MIPs were obtained to evaluate the vascular anatomy.  CONTRAST:  186mL OMNIPAQUE IOHEXOL 350 MG/ML SOLN  COMPARISON:  01/07/2014 as well as abdominal CT 08/22/2013.  FINDINGS: Lungs  are adequately inflated with mild centrilobular emphysematous disease. There are 4 small peripheral right lung nodules with the largest measuring approximately 6 mm over the posterior right lower lobe as these are all unchanged. There is no focal consolidation or effusion. Airways are within normal.  Heart is normal in size. Mild calcified plaque over the coronary arteries. Evidence of prior median sternotomy. No definite pulmonary emboli are seen, although the pulmonary arterial contrast density is not optimal as a small peripheral embolus may not be visualized on this exam. There is minimal calcified plaque involving the thoracic aorta. There is no mediastinal or hilar adenopathy.  Images through the upper abdomen demonstrate evidence of a prior left nephrectomy. There is a sub cm hypodensity over the mid to upper pole cortex of the right kidney likely a cyst but too small to characterize as this is unchanged. There are degenerative changes of the spine.  Review of the MIP images confirms the above findings.  IMPRESSION: No acute cardiopulmonary disease and no definite evidence of pulmonary embolism.  Mild emphysematous disease with several small stable right lung nodules with the largest measuring 6 mm over the right lower lobe. Recommend followup CT in 1 year. This recommendation follows the consensus statement: Guidelines for Management of Small Pulmonary Nodules Detected on CT Scans: A Statement from the [employer] as published in Radiology 2005; 237:395-400. Online at: https://www.arnold.com/.  Sub cm hypodensity over the mid to upper pole right kidney unchanged and likely cysts too small to characterize.   Electronically Signed   By: Marin Olp M.D.   On: 05/14/2014 10:30    Microbiology: Recent Results (from the past 240 hour(s))  Culture, sputum-assessment     Status: None   Collection Time: 05/14/14  6:14 AM  Result Value Ref Range Status   Specimen  Description SPUTUM  Final   Special Requests NONE  Final   Sputum evaluation   Final    THIS SPECIMEN IS ACCEPTABLE. RESPIRATORY CULTURE REPORT TO FOLLOW.   Report Status 05/14/2014 FINAL  Final  Culture, respiratory (NON-Expectorated)     Status: None (Preliminary result)   Collection Time: 05/14/14  6:14 AM  Result Value Ref Range Status   Specimen Description SPUTUM  Final  Special Requests NONE  Final   Gram Stain   Final    MODERATE WBC PRESENT, PREDOMINANTLY PMN RARE SQUAMOUS EPITHELIAL CELLS PRESENT MODERATE GRAM NEGATIVE COCCI FEW GRAM POSITIVE COCCI IN PAIRS RARE GRAM POSITIVE RODS    Culture   Final    NORMAL OROPHARYNGEAL FLORA Performed at [facility]    Report Status PENDING  Incomplete     Labs: Basic Metabolic Panel:  Recent Labs Lab 05/13/14 2140 05/14/14 0305 05/15/14 0950 05/16/14 0501 05/17/14 0500  NA 145 139 137 137 137  K 4.1 3.3* 4.9 4.9 4.3  CL 102 99 96 96 95*  CO2 24 24 25 26 30   GLUCOSE 105* 110* 179* 144* 149*  BUN 22 26* 45* 64* 68*  CREATININE 1.32 1.30 1.80* 2.11* 1.83*  CALCIUM 10.0 9.7 9.7 9.8 9.6  MG  --  2.1  --   --   --    Liver Function Tests:  Recent Labs Lab 05/14/14 0305  AST 18  ALT 19  ALKPHOS 56  BILITOT 0.3  PROT 7.1  ALBUMIN 3.9   No results for input(s): LIPASE, AMYLASE in the last 168 hours. No results for input(s): AMMONIA in the last 168 hours. CBC:  Recent Labs Lab 05/13/14 2140 05/14/14 0305  WBC 7.2 7.9  NEUTROABS 5.2  --   HGB 13.4 12.6*  HCT 40.5 37.8*  MCV 92.7 92.6  PLT 186 176   Cardiac Enzymes:  Recent Labs Lab 05/13/14 2140 05/14/14 0305 05/14/14 0837 05/14/14 1434  TROPONINI <0.30 <0.30 <0.30 <0.30   BNP: BNP (last 3 results)  Recent Labs  03/20/14 0906 04/17/14 0049 05/13/14 2140  PROBNP 185.0* 1368.0* 697.9*   CBG: No results for input(s): GLUCAP in the last 168 hours.     SignedLouellen Molder  [facility] 05/17/2014, 10:10 AM

## 2014-05-17 NOTE — Plan of Care (Signed)
Problem: Phase I Progression Outcomes Goal: Discharge plan established Outcome: Completed/Met Date Met:  05/17/14

## 2014-05-17 NOTE — Discharge Instructions (Addendum)

## 2014-05-17 NOTE — Plan of Care (Signed)
Problem: Phase II Progression Outcomes Goal: ADLs completed with minimal assistance Outcome: Completed/Met Date Met:  05/17/14

## 2014-05-24 ENCOUNTER — Telehealth: Payer: Self-pay | Admitting: *Deleted

## 2014-05-24 ENCOUNTER — Telehealth: Payer: Self-pay | Admitting: Family Medicine

## 2014-05-24 ENCOUNTER — Encounter: Payer: Self-pay | Admitting: Surgery

## 2014-05-24 MED ORDER — ALPRAZOLAM 1 MG PO TABS: 1.0000 mg | ORAL_TABLET | Freq: Two times a day (BID) | ORAL | Status: DC | PRN

## 2014-05-24 NOTE — Telephone Encounter (Signed)
Pt aware of Rx in front office ready to pick up

## 2014-05-24 NOTE — Telephone Encounter (Signed)
Expand All Collapse All   Pt.called to request a refill for ALPRAZolam (XANAX) 1 MG tablet . Please f/u with pt.

## 2014-05-24 NOTE — Telephone Encounter (Signed)
Xanax refilled and placed up front for pick up. Please inform patient.

## 2014-05-24 NOTE — Telephone Encounter (Signed)
Pt.called to request a refill for ALPRAZolam (XANAX) 1 MG tablet . Please f/u with pt.

## 2014-05-27 ENCOUNTER — Ambulatory Visit (INDEPENDENT_AMBULATORY_CARE_PROVIDER_SITE_OTHER): Payer: PRIVATE HEALTH INSURANCE | Admitting: Surgery

## 2014-05-27 ENCOUNTER — Encounter: Payer: Self-pay | Admitting: Surgery

## 2014-05-27 VITALS — BP 153/71 | HR 81 | Ht 68.5 in | Wt 220.0 lb

## 2014-05-27 DIAGNOSIS — I70219 Atherosclerosis of native arteries of extremities with intermittent claudication, unspecified extremity: Secondary | ICD-10-CM

## 2014-05-27 NOTE — Progress Notes (Signed)
Patient name: Lucas [name] MRN: [id number] DOB: Apr 14, 1946 Sex: male     Chief Complaint  Patient presents with  . Re-evaluation    3 month f/u - pvd claudication    HISTORY OF PRESENT ILLNESS: Patient is back today for continued surveillance and follow-up of his right leg claudication.  He is status post angioplasty of a right external iliac artery stenosis on 12/26/2013.  This helped his symptoms somewhat, however he still suffers from lifestyle limiting claudication which affects his ability to walk for more than approximately 1 minute.  He also is now complaining of pain in his left thigh which occurs mostly when trying to stand up.  He has a long-standing history of tobacco abuse but has quit.  He still complains of cough and shortness of breath which he attributes to his COPD.  He has a history of heart failure and atrial fibrillation, for which he is on chronic anticoagulation.  He has gone coronary artery bypass grafting in the past.  He is asymptomatic.  He denies having any ulcers or rest pain.  He takes a statin for hypercholesterolemia.  He did not take cilostazol given his heart failure.  He did not have any significant benefit from Trental.  Past Medical History  Diagnosis Date  . CAD (coronary artery disease)     a. s/p CABG x 5;  b. 04/2012 Cath: patent LIMA->LAD and VG->OM3, all other grafts occluded. c. Cath 12/2013: patent SVG-OM3 & LIMA-LAD, known occ other VG.  Marland Kitchen Hypertension   . Adjustment disorder with anxiety   . Hyperlipidemia     a. Unable to take statins.  Marland Kitchen BPH (benign prostatic hypertrophy)   . COPD (chronic obstructive pulmonary disease)     a. on home O2.  . Arthritis   . Ischemic cardiomyopathy     a. 06/2013 Echo: EF 30-35%, no reg wma's, Gr2 DD, triv AI, mod dil LA, nl RV fxn.  . Chronic systolic CHF (congestive heart failure)     a. 06/2013 Echo: EF 30-35%. b. 11/08/13 EF 40-45%, LVH, HK of the mid-distalanterseptal myocardium, trivial Ao regurg,  calcified mitral annulus, RA mildly dilated  . Prostate cancer dx'd 2012  . Renal cancer dx'd 1997    lt nephrectomy  . CKD (chronic kidney disease), stage III   . Tobacco abuse     a. 37 yr hx as of 2015, transitioned to e-cig.  Marland Kitchen Syncope     a. 08/2013: the day following cath, no injury, had received multiple doses of Versed for anxiety - this was felt to be a contributing factor.  Marland Kitchen PAF (paroxysmal atrial fibrillation)     a. on Eliquis 5 mg bid  . Hypotension     a. H/o soft BP prohibiting ACEI/ARB use.  . Chronic pain   . PAD (peripheral artery disease)     a. 12/2013: PV angio s/p angioplasty to R external iliac artery stenosis, also has R SFA occlusion with reconstitution of above-knee popliteal artery, 2 vessel runoff via peroneal and posterior tib.b. LE doppler 02/2014 ABI right 0.52, left 0.95  . Pulmonary nodules     a. 12/2013:  small pulmonary nodules measuring up to 51mm in RM/LL, f/u recommended 6-12 months.  . Anxiety Dx 2004    Past Surgical History  Procedure Laterality Date  . Coronary artery bypass graft      x 5  . Cardiac catheterization    . Nephrectomy      left nephrectomy  for ca  . Posterior cervical laminectomy      x 8   limited ROM  and can't lie flat  . Heart stents      x 5  . Robot assisted laparoscopic radical prostatectomy      for prostate cancer  . Cystoscopy with litholapaxy  05/01/2012    Procedure: CYSTOSCOPY WITH LITHOLAPAXY;  Surgeon: Dutch Gray, MD;  Location: [facility];  Service: Urology;  Laterality: N/A;  . Prostate cancer    . Kidney surgery Left 1994    History   Social History  . Marital Status: Widowed    Spouse Name: N/A    Number of Children: N/A  . Years of Education: N/A   Occupational History  . disabled    Social History Main Topics  . Smoking status: Former Smoker -- 0.30 packs/day for 54 years    Types: Cigarettes    Quit date: 02/04/2014  . Smokeless tobacco: Former Systems developer    Quit date: 04/19/2012     Comment:  Using e-cig now  . Alcohol Use: No  . Drug Use: No  . Sexual Activity: Not Currently   Other Topics Concern  . Not on file   Social History Narrative    He lives in [place] by himself.  He is a widower.    He continues to smoke about 4 or 5  cigarettes a day but has a greater than 100 pack-year history having  previously smoked up to 3-4 packs a day over the span about 48 years.     He denies alcohol or drug use.  He is retired secondary to disability     since the 3s.     Family History  Problem Relation Age of Onset  . Diabetes Mother   . Heart disease Mother   . Hypertension Mother   . Heart attack Mother   . Coronary artery disease    . Heart disease Father   . Diabetes Father   . Heart attack Father   . Heart disease Brother   . Heart attack Brother   . COPD Sister     Allergies as of 05/27/2014 - Review Complete 05/27/2014  Allergen Reaction Noted  . Ativan [lorazepam] Other (See Comments) 04/21/2012  . Lisinopril Cough 08/22/2013  . Morphine and related Other (See Comments) 04/18/2012  . Ibuprofen Hives, Nausea And Vomiting, and Rash 04/17/2012    Current Outpatient Prescriptions on File Prior to Visit  Medication Sig Dispense Refill  . albuterol (PROVENTIL HFA;VENTOLIN HFA) 108 (90 BASE) MCG/ACT inhaler Inhale 2 puffs into the lungs every 6 (six) hours as needed for wheezing or shortness of breath. Shortness of breath 1 Inhaler 5  . albuterol (PROVENTIL) (2.5 MG/3ML) 0.083% nebulizer solution Take 3 mLs (2.5 mg total) by nebulization 4 (four) times daily. 75 mL 5  . ALPRAZolam (XANAX) 1 MG tablet Take 1 tablet (1 mg total) by mouth 2 (two) times daily as needed for anxiety. 30 tablet 2  . apixaban (ELIQUIS) 5 MG TABS tablet Take 1 tablet (5 mg total) by mouth 2 (two) times daily. 60 tablet 1  . aspirin EC 81 MG tablet Take 81 mg by mouth daily.    Marland Kitchen atorvastatin (LIPITOR) 40 MG tablet Take 1 tablet (40 mg total) by mouth daily at 6 PM. 30 tablet 0  .  doxazosin (CARDURA) 8 MG tablet Take 8 mg by mouth at bedtime.     . furosemide (LASIX) 20 MG tablet Take 40 mg by  mouth 2 (two) times daily.     Marland Kitchen HYDROcodone-acetaminophen (NORCO) 10-325 MG per tablet Take 1 tablet by mouth 5 (five) times daily. 40 tablet 0  . HYDROmorphone (DILAUDID) 4 MG tablet Take 1 tablet (4 mg total) by mouth every 6 (six) hours as needed for severe pain. 30 tablet 0  . ipratropium (ATROVENT) 0.02 % nebulizer solution Take 0.5 mg by nebulization 4 (four) times daily.    . Ipratropium-Albuterol (COMBIVENT RESPIMAT) 20-100 MCG/ACT AERS respimat Inhale 1 puff into the lungs 4 (four) times daily. 1 Inhaler 5  . isosorbide mononitrate (IMDUR) 30 MG 24 hr tablet Take 2 tablets (60 mg total) by mouth daily. 60 tablet 1  . mometasone-formoterol (DULERA) 100-5 MCG/ACT AERO Inhale 2 puffs into the lungs 2 (two) times daily. 1 Inhaler 0  . nitroGLYCERIN (NITROSTAT) 0.3 MG SL tablet Place 0.3 mg under the tongue every 5 (five) minutes as needed for chest pain.    . pantoprazole (PROTONIX) 40 MG tablet Take 1 tablet (40 mg total) by mouth daily at 12 noon. 30 tablet 1  . predniSONE (DELTASONE) 20 MG tablet Take 1 tablet (20 mg total) by mouth daily with breakfast. 16 tablet 0  . guaiFENesin (MUCINEX) 600 MG 12 hr tablet Take 2 tablets (1,200 mg total) by mouth 2 (two) times daily. 10 tablet 0  . guaiFENesin-dextromethorphan (ROBITUSSIN DM) 100-10 MG/5ML syrup Take 5 mLs by mouth every 4 (four) hours as needed for cough. 118 mL 0  . potassium chloride SA (K-DUR,KLOR-CON) 20 MEQ tablet Take 1 tablet (20 mEq total) by mouth daily. 30 tablet 1   No current facility-administered medications on file prior to visit.     REVIEW OF SYSTEMS: Cardiovascular: No chest pain, chest pressure, palpitations, orthopnea, or dyspnea on exertion.right leg claudication  No history of DVT or phlebitis. Pulmonary: positive productive cough Neurologic: No weakness, paresthesias, aphasia, or amaurosis. No  dizziness. Hematologic: No bleeding problems or clotting disorders. Musculoskeletal: No joint pain or joint swelling. Gastrointestinal: No blood in stool or hematemesis Genitourinary: No dysuria or hematuria. Psychiatric:: No history of major depression. Integumentary: No rashes or ulcers. Constitutional: No fever or chills.  PHYSICAL EXAMINATION:   Vital signs are BP 153/71 mmHg  Pulse 81  Ht 5' 8.5" (1.74 m)  Wt 220 lb (99.791 kg)  BMI 32.96 kg/m2  SpO2 97% General: The patient appears their stated age. HEENT:  No gross abnormalities Pulmonary:  Non labored breathing Abdomen: Soft and non-tender Musculoskeletal: There are no major deformities. Neurologic: No focal weakness or paresthesias are detected, Skin: There are no ulcer or rashes noted. Psychiatric: The patient has normal affect. Cardiovascular: There is a regular rate and rhythm without significant murmur appreciated.   Diagnostic Studies none  Assessment: Atherosclerosis with claudication, right greater than left Plan: The patient did not have an adequate response to medical management.  He has previously undergone a right iliac angioplasty.  He has a known occlusion of his right superficial femoral artery.  He cannot tolerate his level of disability.  We have decided to proceed with attempted subintimal recanalization of his right superficial femoral artery.  I will plan on accessing his left common femoral artery.  I will also evaluate his left leg and intervene on the inflow should he have had progression of his disease.  This is been scheduled for December 9.  I will discuss with cardiology their recommendations for anticoagulation cessation, in anticipation of his procedure  V. Leia Alf, M.D. [facility] Office: [phone] Pager:  [phone]

## 2014-05-28 ENCOUNTER — Telehealth: Payer: Self-pay | Admitting: Family Medicine

## 2014-05-28 ENCOUNTER — Telehealth: Payer: Self-pay | Admitting: Emergency Medicine

## 2014-05-28 ENCOUNTER — Other Ambulatory Visit: Payer: Self-pay | Admitting: Family Medicine

## 2014-05-28 MED ORDER — ALPRAZOLAM 1 MG PO TABS: 1.0000 mg | ORAL_TABLET | Freq: Two times a day (BID) | ORAL | Status: DC | PRN

## 2014-05-28 NOTE — Telephone Encounter (Signed)
Pt is requesting a refill for HYDROcodone-acetaminophen (NORCO) 10-325 MG per tablet. Pt ran out the day before yesterday and has an appointment with the pain management clinic on the 10th of dec and is hoping to get a small supply to last him till then. Please follow up with pt.

## 2014-05-28 NOTE — Telephone Encounter (Signed)
Pt called in requesting medication for pain medications Pt was prescribed Dilaudid and Hydrocodone from outside source 05/17/2014 States he needs something to hold him over until scheduled pain clinic appt 12/10 Please f/u

## 2014-05-31 ENCOUNTER — Telehealth: Payer: Self-pay | Admitting: Internal Medicine

## 2014-05-31 MED ORDER — HYDROCODONE-ACETAMINOPHEN 10-325 MG PO TABS: 1.0000 | ORAL_TABLET | Freq: Three times a day (TID) | ORAL | Status: DC

## 2014-05-31 MED ORDER — ALBUTEROL SULFATE (2.5 MG/3ML) 0.083% IN NEBU: 2.5000 mg | INHALATION_SOLUTION | Freq: Four times a day (QID) | RESPIRATORY_TRACT | Status: AC

## 2014-05-31 NOTE — Addendum Note (Signed)
Addended by: Boykin Nearing on: 05/31/2014 05:36 PM   Modules accepted: Orders

## 2014-05-31 NOTE — Telephone Encounter (Signed)
Lucas [name], please call patient.  Refilled Vicodin x 1.  Patient should take the medicine 3 times daily.  Rx up front.

## 2014-05-31 NOTE — Telephone Encounter (Signed)
Rx at front office. Pt aware will pick up on Monday

## 2014-05-31 NOTE — Telephone Encounter (Signed)
Called spoke with pt. Aware RX for albuterol has been sent in. Nothing further needed

## 2014-06-03 ENCOUNTER — Other Ambulatory Visit: Payer: Self-pay

## 2014-06-03 ENCOUNTER — Telehealth: Payer: Self-pay | Admitting: Cardiovascular Disease

## 2014-06-03 NOTE — Telephone Encounter (Signed)
New message    Patient need to stop eliquis prior to upcoming procedure.

## 2014-06-03 NOTE — Telephone Encounter (Signed)
Diagnoses     Atherosclerosis of native arteries of extremity with intermittent claudication - Primary    ICD-9-CM: 440.21 ICD-10-CM: I70.219       Reason for Visit     Re-evaluation    3 month f/u - pvd claudication    Reason for Visit History        Current Vitals  Most recent update: 05/27/2014 8:37 AM by Bard Herbert Maness-Harrison, CMA    BP Pulse Ht Wt BMI SpO2    153/71 mmHg 81 5' 8.5" (1.74 m) 220 lb (99.791 kg) 32.96 kg/m2 97%    Vitals History     Progress Notes      Serafina Mitchell, MD at 05/27/2014 9:13 AM     Status: Signed       Expand All Collapse All       Patient name: Lucas Virgil LawsonMRN: [id number] DOB: [birth year]Sex: male    Chief Complaint  Patient presents with  . Re-evaluation    3 month f/u - pvd claudication    HISTORY OF PRESENT ILLNESS: Patient is back today for continued surveillance and follow-up of his right leg claudication. He is status post angioplasty of a right external iliac artery stenosis on 12/26/2013. This helped his symptoms somewhat, however he still suffers from lifestyle limiting claudication which affects his ability to walk for more than approximately 1 minute. He also is now complaining of pain in his left thigh which occurs mostly when trying to stand up. He has a long-standing history of tobacco abuse but has quit. He still complains of cough and shortness of breath which he attributes to his COPD. He has a history of heart failure and atrial fibrillation, for which he is on chronic anticoagulation. He has gone coronary artery bypass grafting in the past. He is asymptomatic. He denies having any ulcers or rest pain. He takes a statin for hypercholesterolemia. He did not take cilostazol given his heart failure. He did not have any significant benefit from Trental.  Past Medical History  Diagnosis Date  . CAD (coronary artery disease)     a. s/p  CABG x 5; b. 04/2012 Cath: patent LIMA->LAD and VG->OM3, all other grafts occluded. c. Cath 12/2013: patent SVG-OM3 & LIMA-LAD, known occ other VG.  Marland Kitchen Hypertension   . Adjustment disorder with anxiety   . Hyperlipidemia     a. Unable to take statins.  Marland Kitchen BPH (benign prostatic hypertrophy)   . COPD (chronic obstructive pulmonary disease)     a. on home O2.  . Arthritis   . Ischemic cardiomyopathy     a. 06/2013 Echo: EF 30-35%, no reg wma's, Gr2 DD, triv AI, mod dil LA, nl RV fxn.  . Chronic systolic CHF (congestive heart failure)     a. 06/2013 Echo: EF 30-35%. b. 11/08/13 EF 40-45%, LVH, HK of the mid-distalanterseptal myocardium, trivial Ao regurg, calcified mitral annulus, RA mildly dilated  . Prostate cancer dx'd 2012  . Renal cancer dx'd 1997    lt nephrectomy  . CKD (chronic kidney disease), stage III   . Tobacco abuse     a. 43 yr hx as of 2015, transitioned to e-cig.  Marland Kitchen Syncope     a. 08/2013: the day following cath, no injury, had received multiple doses of Versed for anxiety - this was felt to be a contributing factor.  Marland Kitchen PAF (paroxysmal atrial fibrillation)     a. on Eliquis 5 mg bid  . Hypotension     a. H/o soft  BP prohibiting ACEI/ARB use.  . Chronic pain   . PAD (peripheral artery disease)     a. 12/2013: PV angio s/p angioplasty to R external iliac artery stenosis, also has R SFA occlusion with reconstitution of above-knee popliteal artery, 2 vessel runoff via peroneal and posterior tib.b. LE doppler 02/2014 ABI right 0.52, left 0.95  . Pulmonary nodules     a. 12/2013: small pulmonary nodules measuring up to 50mm in RM/LL, f/u recommended 6-12 months.  . Anxiety Dx 2004    Past Surgical History  Procedure Laterality Date  . Coronary artery bypass graft      x 5  . Cardiac catheterization    . Nephrectomy      left nephrectomy for ca  . Posterior  cervical laminectomy      x 8 limited ROM and can't lie flat  . Heart stents      x 5  . Robot assisted laparoscopic radical prostatectomy      for prostate cancer  . Cystoscopy with litholapaxy  05/01/2012    Procedure: CYSTOSCOPY WITH LITHOLAPAXY; Surgeon: Dutch Gray, MD; Location: [facility]; Service: Urology; Laterality: N/A;  . Prostate cancer    . Kidney surgery Left 1994    History   Social History  . Marital Status: Widowed    Spouse Name: N/A    Number of Children: N/A  . Years of Education: N/A   Occupational History  . disabled    Social History Main Topics  . Smoking status: Former Smoker -- 0.30 packs/day for 54 years    Types: Cigarettes    Quit date: 02/04/2014  . Smokeless tobacco: Former Systems developer    Quit date: 04/19/2012     Comment: Using e-cig now  . Alcohol Use: No  . Drug Use: No  . Sexual Activity: Not Currently   Other Topics Concern  . Not on file   Social History Narrative   He lives in [place] by himself. He is a widower.    He continues to smoke about 4 or 5 cigarettes a day but has a greater than 100 pack-year history having previously smoked up to 3-4 packs a day over the span about 48 years.    He denies alcohol or drug use. He is retired secondary to disability    since the 81s.     Family History  Problem Relation Age of Onset  . Diabetes Mother   . Heart disease Mother   . Hypertension Mother   . Heart attack Mother   . Coronary artery disease    . Heart disease Father   . Diabetes Father   . Heart attack Father   . Heart disease Brother   . Heart attack Brother   . COPD Sister     Allergies as of 05/27/2014 - Review Complete 05/27/2014  Allergen Reaction Noted  . Ativan [lorazepam] Other (See Comments) 04/21/2012  . Lisinopril Cough  08/22/2013  . Morphine and related Other (See Comments) 04/18/2012  . Ibuprofen Hives, Nausea And Vomiting, and Rash 04/17/2012    Current Outpatient Prescriptions on File Prior to Visit  Medication Sig Dispense Refill  . albuterol (PROVENTIL HFA;VENTOLIN HFA) 108 (90 BASE) MCG/ACT inhaler Inhale 2 puffs into the lungs every 6 (six) hours as needed for wheezing or shortness of breath. Shortness of breath 1 Inhaler 5  . albuterol (PROVENTIL) (2.5 MG/3ML) 0.083% nebulizer solution Take 3 mLs (2.5 mg total) by nebulization 4 (four) times daily. 75 mL 5  . ALPRAZolam (  XANAX) 1 MG tablet Take 1 tablet (1 mg total) by mouth 2 (two) times daily as needed for anxiety. 30 tablet 2  . apixaban (ELIQUIS) 5 MG TABS tablet Take 1 tablet (5 mg total) by mouth 2 (two) times daily. 60 tablet 1  . aspirin EC 81 MG tablet Take 81 mg by mouth daily.    Marland Kitchen atorvastatin (LIPITOR) 40 MG tablet Take 1 tablet (40 mg total) by mouth daily at 6 PM. 30 tablet 0  . doxazosin (CARDURA) 8 MG tablet Take 8 mg by mouth at bedtime.     . furosemide (LASIX) 20 MG tablet Take 40 mg by mouth 2 (two) times daily.     Marland Kitchen HYDROcodone-acetaminophen (NORCO) 10-325 MG per tablet Take 1 tablet by mouth 5 (five) times daily. 40 tablet 0  . HYDROmorphone (DILAUDID) 4 MG tablet Take 1 tablet (4 mg total) by mouth every 6 (six) hours as needed for severe pain. 30 tablet 0  . ipratropium (ATROVENT) 0.02 % nebulizer solution Take 0.5 mg by nebulization 4 (four) times daily.    . Ipratropium-Albuterol (COMBIVENT RESPIMAT) 20-100 MCG/ACT AERS respimat Inhale 1 puff into the lungs 4 (four) times daily. 1 Inhaler 5  . isosorbide mononitrate (IMDUR) 30 MG 24 hr tablet Take 2 tablets (60 mg total) by mouth daily. 60 tablet 1  . mometasone-formoterol (DULERA) 100-5 MCG/ACT AERO Inhale 2 puffs into the lungs 2 (two) times daily. 1 Inhaler 0  . nitroGLYCERIN  (NITROSTAT) 0.3 MG SL tablet Place 0.3 mg under the tongue every 5 (five) minutes as needed for chest pain.    . pantoprazole (PROTONIX) 40 MG tablet Take 1 tablet (40 mg total) by mouth daily at 12 noon. 30 tablet 1  . predniSONE (DELTASONE) 20 MG tablet Take 1 tablet (20 mg total) by mouth daily with breakfast. 16 tablet 0  . guaiFENesin (MUCINEX) 600 MG 12 hr tablet Take 2 tablets (1,200 mg total) by mouth 2 (two) times daily. 10 tablet 0  . guaiFENesin-dextromethorphan (ROBITUSSIN DM) 100-10 MG/5ML syrup Take 5 mLs by mouth every 4 (four) hours as needed for cough. 118 mL 0  . potassium chloride SA (K-DUR,KLOR-CON) 20 MEQ tablet Take 1 tablet (20 mEq total) by mouth daily. 30 tablet 1   No current facility-administered medications on file prior to visit.     REVIEW OF SYSTEMS: Cardiovascular: No chest pain, chest pressure, palpitations, orthopnea, or dyspnea on exertion.right leg claudication No history of DVT or phlebitis. Pulmonary: positive productive cough Neurologic: No weakness, paresthesias, aphasia, or amaurosis. No dizziness. Hematologic: No bleeding problems or clotting disorders. Musculoskeletal: No joint pain or joint swelling. Gastrointestinal: No blood in stool or hematemesis Genitourinary: No dysuria or hematuria. Psychiatric:: No history of major depression. Integumentary: No rashes or ulcers. Constitutional: No fever or chills.  PHYSICAL EXAMINATION:  Vital signs are BP 153/71 mmHg  Pulse 81  Ht 5' 8.5" (1.74 m)  Wt 220 lb (99.791 kg)  BMI 32.96 kg/m2  SpO2 97% General: The patient appears their stated age. HEENT: No gross abnormalities Pulmonary: Non labored breathing Abdomen: Soft and non-tender Musculoskeletal: There are no major deformities. Neurologic: No focal weakness or paresthesias are detected, Skin: There are no ulcer or rashes noted. Psychiatric: The patient has normal affect. Cardiovascular: There is a regular  rate and rhythm without significant murmur appreciated.   Diagnostic Studies none  Assessment: Atherosclerosis with claudication, right greater than left Plan: The patient did not have an adequate response to medical management. He has  previously undergone a right iliac angioplasty. He has a known occlusion of his right superficial femoral artery. He cannot tolerate his level of disability. We have decided to proceed with attempted subintimal recanalization of his right superficial femoral artery. I will plan on accessing his left common femoral artery. I will also evaluate his left leg and intervene on the inflow should he have had progression of his disease. This is been scheduled for December 9. I will discuss with cardiology their recommendations for anticoagulation cessation, in anticipation of his procedure  V. Leia Alf, M.D. [facility] Office: [phone] Pager: [phone]               Therapy Notes     No notes of this type exist for this encounter.     Not recorded        Referring Provider     Willey Blade, MD     Other Encounter Related Information     Allergies & Medications    Problem List    History    Patient-Entered Questionnaires      Level of Service     PR OFFICE OUTPATIENT VISIT 25 MINUTES [99214]         All Flowsheet Templates (all recorded)     Anthropometrics    Custom Formula Data    Encounter Vitals      Referring Provider     Willey Blade, MD     All Charges for This Encounter     Code Description Service Date Service Provider Modifiers Qty    [phone] PR OFFICE OUTPATIENT VISIT 25 MINUTES 05/27/2014 Serafina Mitchell, MD  1      Routing History     There are no sent or routed communications associated with this encounter.     AVS Reports     Date/Time Report Action User    05/27/2014 9:11 AM After Visit Summary  Printed [place] Roczniak      Smoking Cessation Audit Trail       Ready to Quit Counseling Given User Date and Time    Office Visit - 02/22/2014 Not Answered Yes Melvenia Needles, NP 02/22/2014 11:55 AM    Office Visit - 01/29/2014 No Yes Melvenia Needles, NP 01/29/2014 9:30 AM    Office Visit - 06/13/2013 Yes Yes Rigoberto Noel, MD 06/13/2013 11:13 AM    Office Visit - 04/03/2013 Yes Yes Rigoberto Noel, MD 04/03/2013 10:40 AM    Office Visit - 03/26/2013 Yes Yes Brand Males, MD 03/26/2013 10:07 AM      Diabetic Foot Exam    No data filed     Diabetic Foot Form - Detailed    No data filed     Diabetic Foot Exam - Simple    No data filed     Guarantor Account: [place], M[place]owen ([id number])     Relation to Patient: Account Type Service Area    Self Personal/Family [place] for This Account     Coverage ID Payor Plan Insurance ID    [id number] [place] MEDICARE [facility] [id number]    [id number] [place] [id number] T        Guarantor Account: Mitchelle, Goerner ([id number])     Relation to Patient: Account Type Service Area    Self Personal/Family [facility]          Guarantor Account: [place]wood, Dittus  D ([id number])     Relation to Patient: Account Type Service Area    Self Personal/Family [facility]

## 2014-06-05 NOTE — Telephone Encounter (Signed)
Follow up    Please advise when patient should eliquis.  Nothing is noted in the notes.

## 2014-06-05 NOTE — Telephone Encounter (Signed)
Lm for [facility] in Dr. Stephens Shire office that Dr. Johnsie Cancel is not in office today.  Will send message to Dr. Johnsie Cancel for directions as to when to stop Eliquis prior to Abdominal Aortogram

## 2014-06-08 NOTE — Telephone Encounter (Signed)
Ok to stop eliquis 3 days before angio / procedure

## 2014-06-10 ENCOUNTER — Encounter: Payer: Self-pay | Admitting: Internal Medicine

## 2014-06-10 ENCOUNTER — Ambulatory Visit (INDEPENDENT_AMBULATORY_CARE_PROVIDER_SITE_OTHER): Payer: Medicare Other | Admitting: Internal Medicine

## 2014-06-10 VITALS — BP 142/88 | HR 85 | Ht 69.0 in | Wt 227.8 lb

## 2014-06-10 DIAGNOSIS — Z720 Tobacco use: Secondary | ICD-10-CM

## 2014-06-10 DIAGNOSIS — Z01811 Encounter for preprocedural respiratory examination: Secondary | ICD-10-CM

## 2014-06-10 DIAGNOSIS — I5022 Chronic systolic (congestive) heart failure: Secondary | ICD-10-CM

## 2014-06-10 DIAGNOSIS — F17200 Nicotine dependence, unspecified, uncomplicated: Secondary | ICD-10-CM

## 2014-06-10 DIAGNOSIS — R911 Solitary pulmonary nodule: Secondary | ICD-10-CM

## 2014-06-10 DIAGNOSIS — J449 Chronic obstructive pulmonary disease, unspecified: Secondary | ICD-10-CM

## 2014-06-10 MED ORDER — MOMETASONE FURO-FORMOTEROL FUM 100-5 MCG/ACT IN AERO: 2.0000 | INHALATION_SPRAY | Freq: Two times a day (BID) | RESPIRATORY_TRACT | Status: AC

## 2014-06-10 MED ORDER — PREDNISONE 10 MG PO TABS: 10.0000 mg | ORAL_TABLET | Freq: Every day | ORAL | Status: DC

## 2014-06-10 MED ORDER — FUROSEMIDE 20 MG PO TABS: 40.0000 mg | ORAL_TABLET | Freq: Two times a day (BID) | ORAL | Status: DC

## 2014-06-10 MED ORDER — DOXAZOSIN MESYLATE 8 MG PO TABS: 8.0000 mg | ORAL_TABLET | Freq: Every day | ORAL | Status: AC

## 2014-06-10 NOTE — Progress Notes (Signed)
Subjective:    Patient ID: Lucas [name], male    DOB: 1945/08/07, 68 y.o.   MRN: [id number]  HPI    Subjective:    Patient ID: Lucas [name], male    DOB: [birth year], 68 y.o.   MRN: [id number]  HPI #Smoking  - quit sept 2014 but having withdrawals  #Moderate COPD Spirometry 04/18/12       06/02/12 FEV1 1.64L 46%             1.94  55%   FVC 2.78L 60%               3.01  65% FEV1/FVC 59                   64   #CHF/CAD - s/p CABG. C - Oct 4315 Systolic CHF admission ef 30% - Sept 4008: Diastolic CHF admission  - DEc 2013: EF 30% Admission for Unstable angiona but welll compensated CHF  #Obesity  - Body mass index is 33.15 kg/(m^2). on 08/31/2013 - Have been ruled out January 2015. Advised one liter of oxygen nocturnally    #AECOPD  - 03/26/13 - office Rx - 04/03/13 - AECOPD with Acute Diast CHF admission though 04/06/13 - Jan 2014 with doxy and pred in office with extended pred a week later  #imaging  - 04/03/13 CXR - CHF changes. NEver had CT -   OV 08/31/2013 Chief Complaint  Patient presents with  . COPD    follow-up. Pt states he has been having increased productive cough with yellow phlegm, increased SOB, chest congestion,  whezing and chest tightness x 3 days.   Followup COPD - Gold stage 2, DLCO 46% -  spet 2014,Full PFT   - At last visit mid January 2015 he had a COPD exacerbation with treated with doxycycline and prednisone. He improved after this. In the interim he had a cardiac cath this is listed below. However for the last 3 days is having increased cough, change in color of sputum, increasing wheeze and increased sputum volume. COPD cat score is in the 26s and reflects COPD exacerbation. He takes Combivent respimat which helps him but he does not take ANoro or breo because this does not help him. He is open to taking her steroids  New issue: He is open to having low-dose CT scan of the chest for lung cancer screening because Medicare is paying for it.  However, our system not yet set up for this    Past, Family, Social reviewed: Since  last visit he has had a cardiac catheterization that apparently was clean however, he does have right lower the claudication and apparently he has stenosis. He has a followup with cardiology pending. He is concerned that he might not be a revascularization candidate because of his COPD. In addition Dr. Elsworth Soho for sleep evaluation he tells me that he does not have sleep apnea; confirmed on electronic medical records dated 07/24/2013. His been advised one liters oxygen for sleep    #COPD exacerbation  - you are in flare up of copd again - Take prednisone 40 mg daily x 2 days, then 20mg  daily x 2 days, then 10mg  daily x 2 days, then 5mg  daily x 2 days and stop  take levaquin 500mg  once daily  X 6 days   #COPD Continue combivent respimat 4 times daily Start QVAR 34mcg, 2 puff twice daily Use albuterol 2 puff as needed No need for ANORO or BREO because this is  not helping you Continue oxygen 18h/day  #Lung cancer screen  - do LDCT chest at next visit when we have systems in place  #preop evaluation for vascular surgery  - I think you might be handle surgery for leg but at some risk; we will have to reassess this formally later  #FOllowuo Return to see my NP Tammy for med calendar - next several days to few weeks REturn to see me in 3 months  - spirometry at followujp      11/30/2013 Follow up  Returns for  3 month COPD follow up .  Reports is doing well.  Complains of hoarseness/sore throat .  Has been hospitalized 3x since last ov for CHF/SOB Most recent admit 5/6 for decompensated CHF w/ acute resp fail w/ hypoxia .  He was diuresis with decreased wt and swelling.  Followed closely by cards.  Says swelling is doing much better.   Currently on Dulera Twice daily  And Combivent Four times a day  And uses Duoneb As needed .  Remains on O2 at 2 l/m At bedtime  And with activity As needed   Denies  any hemoptysis, fever, or orthopnea, PND, or increased leg swelling   OV 12/31/2013  Chief Complaint  Patient presents with  . Follow-up    Pt c/o dyspnea with exeriton and little ambulation. Pt states he had a blockage in his right calf and had a "balloon placed" last week. Pt c/o dry persistant cough and left chest pain with actvity.     Followup Gold stage II COPD and multiple medical problems  - COPD: Currently disease is stable. His inhaler regimen is weird but he wont change it. HE is on dulera and  combivent scheduled. Also on oi2 18h/day. Has class II dyspnea on exertion. Minimal cough only. His dyspnea is out of proportion to severity of copd and is associated with claudication but no chest pain  - Smoking: He thinks he quit smoking but he is actually smoking electronic cigarettes. I cautioned him that this is bad  - Lung cancer screening: Due to cost and insurance issues he has not had CT scan  - . CAT COPD Symptom & Quality of Life Score (GSK trademark) 0 is no burden. 5 is highest burden 03/26/2013  04/23/2013  07/17/2013  08/31/2013 aecopd  Never Cough -> Cough all the time 4 2 3 5   No phlegm in chest -> Chest is full of phlegm 4 2 3 4   No chest tightness -> Chest feels very tight 3 2 3 4   No dyspnea for 1 flight stairs/hill -> Very dyspneic for 1 flight of stairs 4 4 4 4   No limitations for ADL at home -> Very limited with ADL at home 4 2 3 4   Confident leaving home -> Not at all confident leaving home 2 2 2 4   Sleep soundly -> Do not sleep soundly because of lung condition 4 3 3 4   Lots of Energy -> No energy at all 4 4 4 5   TOTAL Score (max 40)  29 21 25  34   Past medical history reviewed: He continues to have claudication despite balloon placement in his femoral artery according to his history. Dyspnea and claudication occurs at the same time. He also has chronic pain but does not have a pain clinic. Apparently in the hospital Dilaudid helped him and he wants  this   01/29/2014 Acute OV  /Post hosp/ER  follow up  COPD , CHF Atrial Fib  Patient presents for a post hospital followup. He was admitted June 28 through July 1 for COPD, exacerbation, and decompensated, acute on chronic congestive heart failure. Patient was treated with IV antibiotics, steroids, and nebulized bronchodilators. Patient did have atypical chest pain. He underwent a CT chest angiogram that was negative for PE.  There were very small pulmonary nodules measuring up to 6 mm in the right middle and lower lobes. Patient was seen by cardiology and ruled out for acute MI and treated with medical management. Patient was treated with diuresis. Since discharge. Patient reports that he has continued to have cough, shortness, of breath and wheezing. He was called in doxycycline on July 6, along with a prednisone taper.  Patient was seen in the emergency room on July 14 and given prednisone taper. Patient says that he continues to have muscle cramping. He is currently on Lasix 40 mg daily He denies any hemoptysis, fever, chest pain, vomiting, diarrhea, bloody stools. He does complain of hoarseness.   02/22/2014 [facility] follow up  Returns for post hospital follow up .  Readmitted for COPD and CHF exacerbation on 7/23-28 tx w/ steroids and diuresis .  Continues to have dry cough, wheezing, DOE, PND, edema in abdomen.   Denies hemoptysis, f/c/s, n/v/d.  finished prednisone this morning.  Smoked last 3 weeks ago, we discussed smoking cessation. Uses e cig , we discussed dangers of these as well.  Wt is up today drink 2 Dr. Malachi Bonds and sausge biscuit and gravy. We discussed his salt intake.  More swollen and bloated today.  Followed by CHF clinic. Suppose to be on Lasix 40mg  in am and 20mg  in evening . But only takes 40mg  in evening.  On 2 l/m O2 At bedtime   We discussed diet and med compliance along with need for closer follow up to help keep out of ER and hospital .  >taper pred to 10mg   daily , low salt diet   03/21/2014 Acute OV  COPD former smoker on chronic steroids and Nocturnal O2, CAD, PAD, CHF (EF40%) , chronic pain on daily narcs  Complains of increased SOB, wheezing, prod cough with yellow mucus, hoarseness x2 days.  Seen by PCP , given   was given Avelox 400mg  x7d and prednisone 10mg  6d taper.  Was recently admitted to hospital for CHF flare, tx w/ diuresis w/ decreased edema. follow up labs yesterday showed declining bnp . K+ was low, rx called in per cards.  Has stopping smoking but now vaping -advised on dangers.  CXR on 8/29 with chronic changes   OV 04/01/14  Follow up COPD -chronic steroid -pred 10mg  daily and Nocturnal O2 Hx of CAD , CHF (EF40%) , chronic pain on daily narcs.  Returns for 3 month follow COPD Reports breathing is improved since last ov; still having some head and chest congestion w/ PND, DOE, wheezing, chest tightness.  Denies any f/c/s, n/v/d, hemoptysis Had recent flare of COPD , tx w/ Avelox and pred burst. Says he is feeling better.  Very upset with new PCP , d/t tapering off Norco . Has been referred to pain clinic . Says he can not live without pain meds. Is waiting to hear back from pain clinic .  Last CXR 03/09/14 w/ chronic changes , no acute process Declines flu shot today . Discussed importance.  Discussed Pneumovax and Prevnar-declines.      OV 06/10/2014  Follow-up COPD with recurrent exacerbations. On chronic prednisone 10 mg daily based on a subjective  physician and nocturnal oxygen. Symptoms associated with obesity, peripheral vascular disease, chronic systolic heart failure, chronic pain on daily narcotics   Returns for routine 2 month follow-up for COPD. Overall stable. No new acute symptoms. He insists that he'll only take 10 mg of prednisone daily. He feels at 5 mg per day does not work for him. He has no new acute issues in terms of his COPD. He wants refill on his prednisone 10 mg per day.  Other issues  - Chronic  systolic heart failure: He wants refill on his Lasix and Cardura. He reports this is stable. He will see his cardiologist in 1 month but he wants refills too tight over till then  - Preoperative pulmonary assessment: This is new. He's having a femoropopliteal bypass surgery according to his history by Dr. Annamarie Major in the next several weeks to a few months. Currently he is not in exacerbation. Overall his morbidities put him at moderate risk at least for pulmonary complications following the surgery  - Lung nodule 6 mm right lower lobe June 2015: 27 CT scan of the chest in spring 2016   - Looking: He has quit but he is on surrounded by several friends who smoke actively around him  - Immunization history: He has consistently declined pneumonia vaccines  Review of Systems    Review of Systems  Constitutional: Negative for fever and unexpected weight change.  HENT: Negative for congestion, dental problem, ear pain, nosebleeds, postnasal drip, rhinorrhea, sinus pressure, sneezing, sore throat and trouble swallowing.   Eyes: Negative for redness and itching.  Respiratory: Positive for cough and shortness of breath. Negative for chest tightness and wheezing.   Cardiovascular:  . Negative for palpitations and leg swelling.  Gastrointestinal: Negative for nausea and vomiting.  Genitourinary: Negative for dysuria.  Musculoskeletal: Negative for joint swelling.  Skin: Negative for rash.  Neurological: Negative for headaches.  Hematological: Does not bruise/bleed easily.  Psychiatric/Behavioral: Negative for dysphoric mood. The patient is not nervous/anxious.    Immunization History  Administered Date(s) Administered  . Influenza Split 04/20/2012  . Influenza,inj,Quad PF,36+ Mos 04/04/2013, 04/26/2014        Current outpatient prescriptions: albuterol (PROVENTIL HFA;VENTOLIN HFA) 108 (90 BASE) MCG/ACT inhaler, Inhale 2 puffs into the lungs every 6 (six) hours as needed for wheezing or  shortness of breath. Shortness of breath, Disp: 1 Inhaler, Rfl: 5;  albuterol (PROVENTIL) (2.5 MG/3ML) 0.083% nebulizer solution, Take 3 mLs (2.5 mg total) by nebulization 4 (four) times daily. Dx J44.1, Disp: 360 mL, Rfl: 5 ALPRAZolam (XANAX) 1 MG tablet, Take 1 tablet (1 mg total) by mouth 2 (two) times daily as needed for anxiety., Disp: 60 tablet, Rfl: 2;  apixaban (ELIQUIS) 5 MG TABS tablet, Take 1 tablet (5 mg total) by mouth 2 (two) times daily., Disp: 60 tablet, Rfl: 1;  aspirin EC 81 MG tablet, Take 81 mg by mouth daily., Disp: , Rfl: ;  doxazosin (CARDURA) 8 MG tablet, Take 8 mg by mouth at bedtime. , Disp: , Rfl:  furosemide (LASIX) 20 MG tablet, Take 40 mg by mouth 2 (two) times daily. , Disp: , Rfl: ;  guaiFENesin (MUCINEX) 600 MG 12 hr tablet, Take 2 tablets (1,200 mg total) by mouth 2 (two) times daily., Disp: 10 tablet, Rfl: 0;  guaiFENesin-dextromethorphan (ROBITUSSIN DM) 100-10 MG/5ML syrup, Take 5 mLs by mouth every 4 (four) hours as needed for cough., Disp: 118 mL, Rfl: 0 HYDROcodone-acetaminophen (NORCO) 10-325 MG per tablet, Take 1 tablet by mouth  3 (three) times daily., Disp: 60 tablet, Rfl: 0;  HYDROmorphone (DILAUDID) 4 MG tablet, Take 1 tablet (4 mg total) by mouth every 6 (six) hours as needed for severe pain., Disp: 30 tablet, Rfl: 0;  ipratropium (ATROVENT) 0.02 % nebulizer solution, Take 0.5 mg by nebulization 4 (four) times daily., Disp: , Rfl:  Ipratropium-Albuterol (COMBIVENT RESPIMAT) 20-100 MCG/ACT AERS respimat, Inhale 1 puff into the lungs 4 (four) times daily., Disp: 1 Inhaler, Rfl: 5;  mometasone-formoterol (DULERA) 100-5 MCG/ACT AERO, Inhale 2 puffs into the lungs 2 (two) times daily., Disp: 1 Inhaler, Rfl: 0;  nitroGLYCERIN (NITROSTAT) 0.3 MG SL tablet, Place 0.3 mg under the tongue every 5 (five) minutes as needed for chest pain., Disp: , Rfl:  pantoprazole (PROTONIX) 40 MG tablet, Take 1 tablet (40 mg total) by mouth daily at 12 noon., Disp: 30 tablet, Rfl: 1;   predniSONE (DELTASONE) 10 MG tablet, Take 10 mg by mouth daily., Disp: , Rfl: ;  isosorbide mononitrate (IMDUR) 30 MG 24 hr tablet, Take 2 tablets (60 mg total) by mouth daily. (Patient not taking: Reported on 06/10/2014), Disp: 60 tablet, Rfl: 1 potassium chloride SA (K-DUR,KLOR-CON) 20 MEQ tablet, Take 1 tablet (20 mEq total) by mouth daily. (Patient not taking: Reported on 06/10/2014), Disp: 30 tablet, Rfl: 1  Objective:   Physical Exam Physical Exam   HENT:  Head: Normocephalic and atraumatic.  Right Ear: External ear normal.  Left Ear: External ear normal.  Mouth/Throat: Oropharynx is clear and moist.  No thrush noted.  mallampatti class 3-4,Eyes: Conjunctivae and EOM are normal. Pupils are equal, round, and reactive to light. Right eye exhibits no discharge. Left eye exhibits no discharge. No scleral icterus.  Neck: Normal range of motion. Neck supple. No JVD present. No tracheal deviation present. No thyromegaly present.  Cardiovascular: Normal rate, regular rhythm and intact distal pulses.  Exam reveals no gallop and no friction rub.   No murmur heard.tr-+ edema  Pulmonary/Chest : decreased BS in bases, no wheezing  Abdominal: Soft. Bowel sounds are normal. He exhibits no distension and no mass. There is no tenderness. There is no rebound and no guarding.  Musculoskeletal: Normal range of motion. Lymphadenopathy:    He has no cervical adenopathy.  Neurological: He is alert and oriented to person, place, and time. He has normal reflexes. No cranial nerve deficit. Coordination normal.           Assessment & Plan:     ICD-9-CM ICD-10-CM   1. COPD, moderate 496 J44.9   2. Chronic systolic CHF (congestive heart failure) 428.22 I50.22    428.0    3. Lung nodule 793.11 R91.1   4. Tobacco use disorder 305.1 Z72.0   5. Preoperative respiratory examination V72.82 Z01.811     #COPD Currently disease is stable Continue combivent respimat 4 times daily Continue Dulera 2 puff  twice daily Continue prednisone at 10mg  per day per your wish (will do refill) Will consider roflumilast to prevent flare ups at next visit Use duoneb as needed Continue oxygen 18h/day Take care of leg bypass - then you can do rehab  #Preoperative Pulmonary Evaluation for Right Lower Extremity Femoral Bypass  - moderate risk for pulmonary complications  - wil send message to Dr Trula Slade  # CHF chronic systolic  - will refill cardura and lasix for 2 months as a courtesy; after that per Dr Johnsie Cancel  #Lung nodule RLL 57mm - June 2015 - do CT chest wo contrast in 9 months; Nurse will ensure there  is order  #SMoking  -glad you quit active smoking but passive smoking dangerous too: stay away from others who smoke   #FOllowup 3 months to see NP Tammy    Dr. Brand Males, M.D., [facility]C.P Pulmonary and Critical Care Medicine Staff Physician [facility] Pager: [phone], If no answer or between  15:00h - 7:00h: call [phone]  06/10/2014 9:42 AM

## 2014-06-10 NOTE — Patient Instructions (Addendum)
#  COPD Currently disease is stable Continue combivent respimat 4 times daily Continue Dulera 2 puff twice daily Continue prednisone at 10mg  per day per your wish (will do refill) Will consider roflumilast to prevent flare ups at next visit Use duoneb as needed Continue oxygen 18h/day Take care of leg bypass - then you can do rehab  #Preoperative Pulmonary Evaluation for Right Lower Extremity Femoral Bypass  - moderate risk for pulmonary complications  - wil send message to Dr Trula Slade  # CHF chronic systolic  - will refill cardura and lasix for 2 months as a courtesy; after that per Dr Johnsie Cancel  #Lung nodule RLL 25mm - June 2015 - do CT chest wo contrast in 9 months; Nurse will ensure there is order  #SMoking  -glad you quit active smoking but passive smoking dangerous too: stay away from others who smoke   #FOllowup 3 months to see NP Tammy

## 2014-06-10 NOTE — Telephone Encounter (Signed)
WILL FAX THIS NOTE  TO  STEPHANIE .[facility]

## 2014-06-13 ENCOUNTER — Telehealth: Payer: Self-pay | Admitting: Internal Medicine

## 2014-06-13 NOTE — Telephone Encounter (Signed)
Pt states that he went to the pharmacy to pick up his [place] and insurance has denied this stating that it needs PA Pt has been out of Dulera x 4 days.  Spoke with Coralyn Mark with [facility], states that this is denied and needs PA Coralyn Mark states that the patient's insurance formulary has most likely changed and will need to contact insurance company for an alternative.

## 2014-06-17 ENCOUNTER — Ambulatory Visit: Payer: Medicare Other | Attending: Family Medicine | Admitting: Family Medicine

## 2014-06-17 ENCOUNTER — Encounter: Payer: Self-pay | Admitting: Family Medicine

## 2014-06-17 VITALS — BP 170/76 | HR 86 | Temp 98.6°F | Resp 20 | Ht 68.5 in | Wt 225.0 lb

## 2014-06-17 DIAGNOSIS — I739 Peripheral vascular disease, unspecified: Secondary | ICD-10-CM | POA: Diagnosis not present

## 2014-06-17 DIAGNOSIS — J449 Chronic obstructive pulmonary disease, unspecified: Secondary | ICD-10-CM | POA: Diagnosis not present

## 2014-06-17 DIAGNOSIS — I5022 Chronic systolic (congestive) heart failure: Secondary | ICD-10-CM | POA: Diagnosis not present

## 2014-06-17 DIAGNOSIS — I10 Essential (primary) hypertension: Secondary | ICD-10-CM | POA: Insufficient documentation

## 2014-06-17 DIAGNOSIS — R609 Edema, unspecified: Secondary | ICD-10-CM | POA: Insufficient documentation

## 2014-06-17 DIAGNOSIS — Z6833 Body mass index (BMI) 33.0-33.9, adult: Secondary | ICD-10-CM | POA: Diagnosis not present

## 2014-06-17 DIAGNOSIS — F17210 Nicotine dependence, cigarettes, uncomplicated: Secondary | ICD-10-CM | POA: Diagnosis not present

## 2014-06-17 DIAGNOSIS — I150 Renovascular hypertension: Secondary | ICD-10-CM

## 2014-06-17 DIAGNOSIS — G8929 Other chronic pain: Secondary | ICD-10-CM | POA: Insufficient documentation

## 2014-06-17 DIAGNOSIS — M79606 Pain in leg, unspecified: Secondary | ICD-10-CM | POA: Diagnosis not present

## 2014-06-17 DIAGNOSIS — Z09 Encounter for follow-up examination after completed treatment for conditions other than malignant neoplasm: Secondary | ICD-10-CM | POA: Diagnosis present

## 2014-06-17 MED ORDER — HYDROCODONE-ACETAMINOPHEN 10-325 MG PO TABS: 1.0000 | ORAL_TABLET | Freq: Three times a day (TID) | ORAL | Status: DC

## 2014-06-17 NOTE — Progress Notes (Signed)
[facility]  Pt stated was in the hospital last month

## 2014-06-17 NOTE — Telephone Encounter (Signed)
Patient is calling to check on the status of PA for dulera.  Lucas [name]

## 2014-06-17 NOTE — Telephone Encounter (Signed)
Called [employer] at [phone]. Ruthe Mannan is not covered and it is not eligible for a PA d/t drug coverage. However, symbicort, breo and advair are covered and will not need a PA. MR please advise which medication to switch pt to.   Called and spoke to pt. Informed pt of the process and a sample of Dulera has been left up front for pick up till decision has been made.

## 2014-06-17 NOTE — Progress Notes (Signed)
   Subjective:    Patient ID: Lucas [name], male    DOB: [birth year], 69 y.o.   MRN: [id number] CC: f/u of HTN and CHF, chronic pain HPI 68 yo M with CHF, COPD, HTN, chronic pain  1. CHF: weight stable. Has le edema. Has some SOB, a bit worse than usual. No chest pain. Compliant with lasix and antihypertensives.   2. COPD: a bit worse. Patient has ran out of [place] and is awaiting prior authorization. His pulmonoligist iw working on this. Still on prednisone 10 daily. He saw his pulmonologist last week.   3. Chronic pain: in chest and legs. Leg pain is due to PAD. He has plan for stenting/bypass for PAD. He has a new patient appt with pain management in 3 days. He was given dilaudid and vicodin following most recent hospital discharge. He is out of dilaudid and nearly out of vicodin. He prefers dilaudid for pain control.   Soc Hx: occassional smoker  Review of Systems As per HPI     Objective:   Physical Exam BP 170/76 mmHg  Pulse 86  Temp(Src) 98.6 F (37 C) (Oral)  Resp 20  Ht 5' 8.5" (1.74 m)  Wt 225 lb (102.059 kg)  BMI 33.71 kg/m2  SpO2 93%  Wt Readings from Last 3 Encounters:  06/17/14 225 lb (102.059 kg)  06/10/14 227 lb 12.8 oz (103.329 kg)  05/27/14 220 lb (99.791 kg)   BP Readings from Last 3 Encounters:  06/17/14 170/76  06/10/14 142/88  05/27/14 153/71  General appearance: alert, cooperative and no distress Lungs: normal WOB, exp wheezing b/l, no crackles  Heart: regular rate and rhythm, S1, S2 normal, no murmur, click, rub or gallop Extremities: edema 2 + b/l        Assessment & Plan:

## 2014-06-17 NOTE — Progress Notes (Signed)
Patient ID: Lucas [name], male   DOB: [birth year], 68 y.o.   MRN: [id number] Lucas [name] is seen back today for a post hospital visit. Seen for Dr. Johnsie Cancel. He is a chronically ill male with multiple issues. These include COPD with chronic respiratory failure on home oxygen at night, chronic systolic HF with EF of 40 to 45%, remote renal cancer with past nephrectomy with CKD stage III, CAD with past CABG with his LIMA to the LAD patent and the SVG to OM3 but all other SVGs remain occluded. His other issues include anxiety, PAF and long standing tobacco abuse, PAD with past angioplasty in June of 2015 and chronic pain. Numerous hospitalizations. He is now on chronic anticoagulation but has had issues in the past with affording the Eliquis.  I have seen him several times - he is trying to have surgical revascularization for his PVD - felt to be a poor candidate from our standpoint.  Just discharged a month ago with recurrent COPD exacerbation/HF.   ABI's August 15 .52 on right .95 on left   Has f/u with [facility] that IM doctor not getting him Norco  Recent hospitalization for COPD exacerbation Rx with steroids and antibiotics   Last cath 10/15 Rec. Medical Rx done under general anesthesia  IMPRESSIONS:  1. Moderately diseased left main coronary artery. 2. Severely diseased native mid left anterior descending artery. Patent LIMA to LAD. 3. Severe disease in the native left circumflex artery and its branches. Patent SVG to OM with mild, proximal disease in the graft. 4. Occluded native right coronary artery. Left to right collaterals fill the distal RCA. 5. Mildly decreased left ventricular systolic function. LVEDP 18 mmHg. Ejection fraction 45%  Having RLE PV procedure 12/16 for resting claudication   ROS: Denies fever, malais, weight loss, blurry vision, decreased visual acuity, cough, sputum, SOB, hemoptysis, pleuritic pain, palpitaitons, heartburn, abdominal pain, melena, lower  extremity edema, claudication, or rash.  All other systems reviewed and negative  General: Affect appropriate Anxious chronically ill male HEENT: normal Neck supple with no adenopathy JVP normal bilateral  bruits no thyromegaly Lungs clear with no wheezing and good diaphragmatic motion Heart:  S1/S2 SEM murmur, no rub, gallop or click PMI normal Abdomen: benighn, BS positve, no tenderness, no AAA no bruit.  No HSM or HJR Decreased pulses below knee on right  Plus one bilateral edema Neuro non-focal Skin warm and dry No muscular weakness   Current Outpatient Prescriptions  Medication Sig Dispense Refill  . albuterol (PROVENTIL HFA;VENTOLIN HFA) 108 (90 BASE) MCG/ACT inhaler Inhale 2 puffs into the lungs every 6 (six) hours as needed for wheezing or shortness of breath. Shortness of breath 1 Inhaler 5  . albuterol (PROVENTIL) (2.5 MG/3ML) 0.083% nebulizer solution Take 3 mLs (2.5 mg total) by nebulization 4 (four) times daily. Dx J44.1 360 mL 5  . ALPRAZolam (XANAX) 1 MG tablet Take 1 tablet (1 mg total) by mouth 2 (two) times daily as needed for anxiety. 60 tablet 2  . apixaban (ELIQUIS) 5 MG TABS tablet Take 1 tablet (5 mg total) by mouth 2 (two) times daily. 60 tablet 1  . aspirin EC 81 MG tablet Take 81 mg by mouth daily.    Marland Kitchen doxazosin (CARDURA) 8 MG tablet Take 1 tablet (8 mg total) by mouth at bedtime. 60 tablet 0  . furosemide (LASIX) 20 MG tablet Take 2 tablets (40 mg total) by mouth 2 (two) times daily. 90 tablet 1  . guaiFENesin (MUCINEX) 600  MG 12 hr tablet Take 2 tablets (1,200 mg total) by mouth 2 (two) times daily. 10 tablet 0  . guaiFENesin-dextromethorphan (ROBITUSSIN DM) 100-10 MG/5ML syrup Take 5 mLs by mouth every 4 (four) hours as needed for cough. (Patient not taking: Reported on 06/17/2014) 118 mL 0  . HYDROcodone-acetaminophen (NORCO) 10-325 MG per tablet Take 1 tablet by mouth 3 (three) times daily. 15 tablet 0  . ipratropium (ATROVENT) 0.02 % nebulizer solution  Take 0.5 mg by nebulization 4 (four) times daily.    . Ipratropium-Albuterol (COMBIVENT RESPIMAT) 20-100 MCG/ACT AERS respimat Inhale 1 puff into the lungs 4 (four) times daily. 1 Inhaler 5  . isosorbide mononitrate (IMDUR) 30 MG 24 hr tablet Take 2 tablets (60 mg total) by mouth daily. (Patient not taking: Reported on 06/17/2014) 60 tablet 1  . mometasone-formoterol (DULERA) 100-5 MCG/ACT AERO Inhale 2 puffs into the lungs 2 (two) times daily. (Patient not taking: Reported on 06/17/2014) 1 Inhaler 5  . nitroGLYCERIN (NITROSTAT) 0.3 MG SL tablet Place 0.3 mg under the tongue every 5 (five) minutes as needed for chest pain.    . pantoprazole (PROTONIX) 40 MG tablet Take 1 tablet (40 mg total) by mouth daily at 12 noon. 30 tablet 1  . potassium chloride SA (K-DUR,KLOR-CON) 20 MEQ tablet Take 1 tablet (20 mEq total) by mouth daily. 30 tablet 1  . predniSONE (DELTASONE) 10 MG tablet Take 1 tablet (10 mg total) by mouth daily. 30 tablet 5   No current facility-administered medications for this visit.    Allergies  Ativan; Lisinopril; Morphine and related; and Ibuprofen  Electrocardiogram:  11/5  SR poor R wave progression possible old anterior mi no change from 2014   Assessment and Plan

## 2014-06-17 NOTE — Patient Instructions (Signed)
Mr. Stump,  Thank you for coming in today.  F/u in 4-6 weeks for CHF/COPD.  Dr. Adrian Blackwater

## 2014-06-18 ENCOUNTER — Encounter: Payer: Self-pay | Admitting: Cardiovascular Disease

## 2014-06-18 ENCOUNTER — Ambulatory Visit (INDEPENDENT_AMBULATORY_CARE_PROVIDER_SITE_OTHER): Payer: Medicare Other | Admitting: Cardiovascular Disease

## 2014-06-18 VITALS — BP 154/74 | HR 83 | Ht 68.5 in | Wt 223.8 lb

## 2014-06-18 DIAGNOSIS — I25701 Atherosclerosis of coronary artery bypass graft(s), unspecified, with angina pectoris with documented spasm: Secondary | ICD-10-CM

## 2014-06-18 DIAGNOSIS — I739 Peripheral vascular disease, unspecified: Secondary | ICD-10-CM

## 2014-06-18 DIAGNOSIS — J441 Chronic obstructive pulmonary disease with (acute) exacerbation: Secondary | ICD-10-CM

## 2014-06-18 DIAGNOSIS — I10 Essential (primary) hypertension: Secondary | ICD-10-CM | POA: Insufficient documentation

## 2014-06-18 MED ORDER — FLUTICASONE FUROATE-VILANTEROL 100-25 MCG/INH IN AEPB: 1.0000 | INHALATION_SPRAY | RESPIRATORY_TRACT | Status: DC

## 2014-06-18 NOTE — Assessment & Plan Note (Signed)
A; elevated today well controlled at previous visit. P: Continue current regimen Close f/u

## 2014-06-18 NOTE — Assessment & Plan Note (Signed)
Stable with no angina and good activity level.  Continue medical Rx Recent cath with stable anatomy

## 2014-06-18 NOTE — Telephone Encounter (Signed)
Send hm BREO please 1 puff daily - the lower dose version  Thanks  Dr. Brand Males, M.D., [facility].C.P Pulmonary and Critical Care Medicine Staff Physician [place] [facility] Pager: [phone], If no answer or between  15:00h - 7:00h: call [phone]  06/18/2014 2:10 AM

## 2014-06-18 NOTE — Assessment & Plan Note (Signed)
Dr Trula Slade to attempt ? Total occlusion atherectomy suspect he may need to convert to open procedure Moderate risk open surgery due to CAD and COPD with recent exacerbation Will hold coumadin 4 days before  procedure

## 2014-06-18 NOTE — Assessment & Plan Note (Signed)
A: weight stable, no increased WOB. BP elevated P: Close monitoring of BP Continue current lasix dose

## 2014-06-18 NOTE — Assessment & Plan Note (Signed)
Well controlled.  Continue current medications and low sodium Dash type diet.    

## 2014-06-18 NOTE — Telephone Encounter (Signed)
Pt aware that Rx for Breo called into pharmacy. Pt aware of instructions of use. Will also ask Pharmacist to demonstrate how to use inhaler.  Pt to contact our office if anything further needed.

## 2014-06-18 NOTE — Patient Instructions (Signed)
Your physician recommends that you continue on your current medications as directed. Please refer to the Current Medication list given to you today.  Your physician recommends that you schedule a follow-up appointment in: 3 months with Dr.Nishan

## 2014-06-18 NOTE — Assessment & Plan Note (Signed)
Less wheezing than when in hospital recently Needs to stop smoking  Continue inhalers

## 2014-06-18 NOTE — Assessment & Plan Note (Signed)
A; in legs and chest. On narcotics. Patient is aware that high dose narcotics is dangerous and he is a high risk for resp depression P: Refilled vicodin 15 tabs. Patient will get to pain management

## 2014-06-20 ENCOUNTER — Encounter (HOSPITAL_COMMUNITY): Payer: Self-pay | Admitting: Cardiovascular Disease

## 2014-06-26 ENCOUNTER — Other Ambulatory Visit: Payer: Self-pay | Admitting: *Deleted

## 2014-06-26 ENCOUNTER — Ambulatory Visit (HOSPITAL_COMMUNITY): Payer: PRIVATE HEALTH INSURANCE | Admitting: Certified Registered"

## 2014-06-26 ENCOUNTER — Encounter (HOSPITAL_COMMUNITY): Payer: Self-pay | Admitting: Certified Registered"

## 2014-06-26 ENCOUNTER — Ambulatory Visit (HOSPITAL_COMMUNITY)
Admission: RE | Admit: 2014-06-26 | Discharge: 2014-06-27 | Disposition: A | Discharge status: Home / Self Care | Payer: PRIVATE HEALTH INSURANCE | Source: Ambulatory Visit | Attending: Surgery | Admitting: Surgery

## 2014-06-26 ENCOUNTER — Encounter (HOSPITAL_COMMUNITY)
Admission: RE | Disposition: A | Discharge status: Home / Self Care | Payer: Self-pay | Source: Ambulatory Visit | Attending: Surgery

## 2014-06-26 DIAGNOSIS — Z79899 Other long term (current) drug therapy: Secondary | ICD-10-CM | POA: Diagnosis not present

## 2014-06-26 DIAGNOSIS — Z7982 Long term (current) use of aspirin: Secondary | ICD-10-CM | POA: Insufficient documentation

## 2014-06-26 DIAGNOSIS — I70213 Atherosclerosis of native arteries of extremities with intermittent claudication, bilateral legs: Secondary | ICD-10-CM | POA: Insufficient documentation

## 2014-06-26 DIAGNOSIS — I5022 Chronic systolic (congestive) heart failure: Secondary | ICD-10-CM | POA: Diagnosis not present

## 2014-06-26 DIAGNOSIS — E785 Hyperlipidemia, unspecified: Secondary | ICD-10-CM | POA: Diagnosis not present

## 2014-06-26 DIAGNOSIS — Z905 Acquired absence of kidney: Secondary | ICD-10-CM | POA: Diagnosis not present

## 2014-06-26 DIAGNOSIS — Z951 Presence of aortocoronary bypass graft: Secondary | ICD-10-CM | POA: Diagnosis not present

## 2014-06-26 DIAGNOSIS — Z8546 Personal history of malignant neoplasm of prostate: Secondary | ICD-10-CM | POA: Insufficient documentation

## 2014-06-26 DIAGNOSIS — J449 Chronic obstructive pulmonary disease, unspecified: Secondary | ICD-10-CM | POA: Diagnosis not present

## 2014-06-26 DIAGNOSIS — N183 Chronic kidney disease, stage 3 (moderate): Secondary | ICD-10-CM | POA: Diagnosis not present

## 2014-06-26 DIAGNOSIS — I129 Hypertensive chronic kidney disease with stage 1 through stage 4 chronic kidney disease, or unspecified chronic kidney disease: Secondary | ICD-10-CM | POA: Insufficient documentation

## 2014-06-26 DIAGNOSIS — Z87891 Personal history of nicotine dependence: Secondary | ICD-10-CM | POA: Diagnosis not present

## 2014-06-26 DIAGNOSIS — Z7901 Long term (current) use of anticoagulants: Secondary | ICD-10-CM | POA: Diagnosis not present

## 2014-06-26 DIAGNOSIS — I480 Paroxysmal atrial fibrillation: Secondary | ICD-10-CM | POA: Diagnosis not present

## 2014-06-26 DIAGNOSIS — N400 Enlarged prostate without lower urinary tract symptoms: Secondary | ICD-10-CM | POA: Diagnosis not present

## 2014-06-26 DIAGNOSIS — Z9981 Dependence on supplemental oxygen: Secondary | ICD-10-CM | POA: Diagnosis not present

## 2014-06-26 DIAGNOSIS — I739 Peripheral vascular disease, unspecified: Secondary | ICD-10-CM

## 2014-06-26 DIAGNOSIS — I2510 Atherosclerotic heart disease of native coronary artery without angina pectoris: Secondary | ICD-10-CM | POA: Insufficient documentation

## 2014-06-26 DIAGNOSIS — I10 Essential (primary) hypertension: Secondary | ICD-10-CM | POA: Insufficient documentation

## 2014-06-26 DIAGNOSIS — Z9862 Peripheral vascular angioplasty status: Secondary | ICD-10-CM

## 2014-06-26 DIAGNOSIS — I70619 Atherosclerosis of nonbiological bypass graft(s) of the extremities with intermittent claudication, unspecified extremity: Secondary | ICD-10-CM | POA: Diagnosis present

## 2014-06-26 HISTORY — PX: ABDOMINAL AORTAGRAM: SHX5454

## 2014-06-26 LAB — POCT ACTIVATED CLOTTING TIME
ACTIVATED CLOTTING TIME: 165 s
Activated Clotting Time: 208 seconds
Activated Clotting Time: 221 seconds
Activated Clotting Time: 208 seconds
Activated Clotting Time: 190 seconds

## 2014-06-26 LAB — POCT I-STAT, CHEM 8
BUN: 27 mg/dL — AB (ref 6–23)
CALCIUM ION: 1.18 mmol/L (ref 1.13–1.30)
CHLORIDE: 102 meq/L (ref 96–112)
Creatinine, Ser: 1.3 mg/dL (ref 0.50–1.35)
GLUCOSE: 107 mg/dL — AB (ref 70–99)
HCT: 44 % (ref 39.0–52.0)
Hemoglobin: 15 g/dL (ref 13.0–17.0)
Potassium: 3.4 mEq/L — ABNORMAL LOW (ref 3.7–5.3)
Sodium: 142 mEq/L (ref 137–147)
TCO2: 24 mmol/L (ref 0–100)

## 2014-06-26 SURGERY — ABDOMINAL AORTAGRAM
Anesthesia: General

## 2014-06-26 MED ORDER — IPRATROPIUM-ALBUTEROL 0.5-2.5 (3) MG/3ML IN SOLN
3.0000 mL | Freq: Four times a day (QID) | RESPIRATORY_TRACT | Status: DC
  Administered 2014-06-26 – 2014-06-27 (×4): 3 mL via RESPIRATORY_TRACT
  Filled 2014-06-26 (×4): qty 3

## 2014-06-26 MED ORDER — ALPRAZOLAM 0.5 MG PO TABS
1.0000 mg | ORAL_TABLET | Freq: Two times a day (BID) | ORAL | Status: DC | PRN
  Administered 2014-06-26: 1 mg via ORAL
  Filled 2014-06-26: qty 2

## 2014-06-26 MED ORDER — ALBUTEROL SULFATE (2.5 MG/3ML) 0.083% IN NEBU: 2.5000 mg | INHALATION_SOLUTION | Freq: Four times a day (QID) | RESPIRATORY_TRACT | Status: DC | PRN

## 2014-06-26 MED ORDER — ASPIRIN EC 81 MG PO TBEC
81.0000 mg | DELAYED_RELEASE_TABLET | Freq: Every day | ORAL | Status: DC
  Administered 2014-06-27: 81 mg via ORAL
  Filled 2014-06-26 (×2): qty 1

## 2014-06-26 MED ORDER — CETYLPYRIDINIUM CHLORIDE 0.05 % MT LIQD
7.0000 mL | Freq: Two times a day (BID) | OROMUCOSAL | Status: DC
  Administered 2014-06-26: 7 mL via OROMUCOSAL

## 2014-06-26 MED ORDER — FUROSEMIDE 40 MG PO TABS
40.0000 mg | ORAL_TABLET | Freq: Two times a day (BID) | ORAL | Status: DC
  Administered 2014-06-26 – 2014-06-27 (×2): 40 mg via ORAL
  Filled 2014-06-26 (×4): qty 1

## 2014-06-26 MED ORDER — HYDRALAZINE HCL 20 MG/ML IJ SOLN: 5.0000 mg | INTRAMUSCULAR | Status: DC | PRN

## 2014-06-26 MED ORDER — HYDROMORPHONE HCL 1 MG/ML IJ SOLN
2.0000 mg | Freq: Once | INTRAMUSCULAR | Status: AC
  Administered 2014-06-26: 2 mg via INTRAVENOUS
  Filled 2014-06-26: qty 2

## 2014-06-26 MED ORDER — ALBUTEROL SULFATE (2.5 MG/3ML) 0.083% IN NEBU: 2.5000 mg | INHALATION_SOLUTION | Freq: Four times a day (QID) | RESPIRATORY_TRACT | Status: DC

## 2014-06-26 MED ORDER — FENTANYL CITRATE 0.05 MG/ML IJ SOLN: 25.0000 ug | INTRAMUSCULAR | Status: DC | PRN

## 2014-06-26 MED ORDER — ONDANSETRON HCL 4 MG/2ML IJ SOLN: 4.0000 mg | Freq: Four times a day (QID) | INTRAMUSCULAR | Status: DC | PRN

## 2014-06-26 MED ORDER — NITROGLYCERIN 0.3 MG SL SUBL: 0.3000 mg | SUBLINGUAL_TABLET | SUBLINGUAL | Status: DC | PRN

## 2014-06-26 MED ORDER — ALUM & MAG HYDROXIDE-SIMETH 200-200-20 MG/5ML PO SUSP: 15.0000 mL | ORAL | Status: DC | PRN

## 2014-06-26 MED ORDER — ACETAMINOPHEN 650 MG RE SUPP: 325.0000 mg | RECTAL | Status: DC | PRN

## 2014-06-26 MED ORDER — MOMETASONE FURO-FORMOTEROL FUM 100-5 MCG/ACT IN AERO
2.0000 | INHALATION_SPRAY | Freq: Two times a day (BID) | RESPIRATORY_TRACT | Status: DC
  Administered 2014-06-26 – 2014-06-27 (×2): 2 via RESPIRATORY_TRACT
  Filled 2014-06-26: qty 8.8

## 2014-06-26 MED ORDER — HYDROMORPHONE HCL 2 MG PO TABS
4.0000 mg | ORAL_TABLET | Freq: Three times a day (TID) | ORAL | Status: DC | PRN
  Administered 2014-06-26 – 2014-06-27 (×2): 4 mg via ORAL
  Filled 2014-06-26 (×2): qty 2

## 2014-06-26 MED ORDER — LIDOCAINE HCL (PF) 1 % IJ SOLN
INTRAMUSCULAR | Status: AC
  Filled 2014-06-26: qty 30

## 2014-06-26 MED ORDER — DOXAZOSIN MESYLATE 8 MG PO TABS
8.0000 mg | ORAL_TABLET | Freq: Every day | ORAL | Status: DC
  Administered 2014-06-26: 8 mg via ORAL
  Filled 2014-06-26 (×2): qty 1

## 2014-06-26 MED ORDER — LABETALOL HCL 5 MG/ML IV SOLN
10.0000 mg | INTRAVENOUS | Status: DC | PRN
  Filled 2014-06-26: qty 4

## 2014-06-26 MED ORDER — ALBUTEROL SULFATE HFA 108 (90 BASE) MCG/ACT IN AERS: 2.0000 | INHALATION_SPRAY | Freq: Four times a day (QID) | RESPIRATORY_TRACT | Status: DC | PRN

## 2014-06-26 MED ORDER — SODIUM CHLORIDE 0.9 % IV SOLN
INTRAVENOUS | Status: DC
  Administered 2014-06-26: 16:00:00 via INTRAVENOUS

## 2014-06-26 MED ORDER — IPRATROPIUM BROMIDE 0.02 % IN SOLN: 0.5000 mg | Freq: Four times a day (QID) | RESPIRATORY_TRACT | Status: DC

## 2014-06-26 MED ORDER — METOPROLOL TARTRATE 1 MG/ML IV SOLN: 2.0000 mg | INTRAVENOUS | Status: DC | PRN

## 2014-06-26 MED ORDER — HYDROCODONE-ACETAMINOPHEN 10-325 MG PO TABS
1.0000 | ORAL_TABLET | Freq: Three times a day (TID) | ORAL | Status: DC
  Administered 2014-06-26 – 2014-06-27 (×2): 1 via ORAL
  Filled 2014-06-26 (×2): qty 1

## 2014-06-26 MED ORDER — APIXABAN 5 MG PO TABS
5.0000 mg | ORAL_TABLET | Freq: Two times a day (BID) | ORAL | Status: DC
  Administered 2014-06-26: 5 mg via ORAL
  Filled 2014-06-26 (×3): qty 1

## 2014-06-26 MED ORDER — IPRATROPIUM-ALBUTEROL 20-100 MCG/ACT IN AERS: 1.0000 | INHALATION_SPRAY | Freq: Four times a day (QID) | RESPIRATORY_TRACT | Status: DC

## 2014-06-26 MED ORDER — PHENOL 1.4 % MT LIQD
1.0000 | OROMUCOSAL | Status: DC | PRN
  Filled 2014-06-26: qty 177

## 2014-06-26 MED ORDER — ACETAMINOPHEN 325 MG PO TABS: 325.0000 mg | ORAL_TABLET | ORAL | Status: DC | PRN

## 2014-06-26 MED ORDER — GUAIFENESIN-DM 100-10 MG/5ML PO SYRP: 15.0000 mL | ORAL_SOLUTION | ORAL | Status: DC | PRN

## 2014-06-26 MED ORDER — PREDNISONE (PAK) 10 MG PO TABS: 10.0000 mg | ORAL_TABLET | Freq: Every day | ORAL | Status: DC

## 2014-06-26 MED ORDER — FENTANYL CITRATE 0.05 MG/ML IJ SOLN
INTRAMUSCULAR | Status: DC | PRN
  Administered 2014-06-26 (×3): 50 ug via INTRAVENOUS

## 2014-06-26 MED ORDER — HEPARIN (PORCINE) IN NACL 2-0.9 UNIT/ML-% IJ SOLN
INTRAMUSCULAR | Status: AC
  Filled 2014-06-26: qty 1000

## 2014-06-26 MED ORDER — HEPARIN SODIUM (PORCINE) 1000 UNIT/ML IJ SOLN
INTRAMUSCULAR | Status: DC | PRN
  Administered 2014-06-26: 2000 [IU] via INTRAVENOUS
  Administered 2014-06-26: 1000 [IU] via INTRAVENOUS
  Administered 2014-06-26: 9000 [IU] via INTRAVENOUS
  Administered 2014-06-26: 2000 [IU] via INTRAVENOUS

## 2014-06-26 MED ORDER — SODIUM CHLORIDE 0.9 % IV SOLN
INTRAVENOUS | Status: DC
  Administered 2014-06-26: 08:00:00 via INTRAVENOUS

## 2014-06-26 MED ORDER — MIDAZOLAM HCL 5 MG/5ML IJ SOLN
INTRAMUSCULAR | Status: DC | PRN
  Administered 2014-06-26: 2 mg via INTRAVENOUS

## 2014-06-26 MED ORDER — LIDOCAINE HCL (CARDIAC) 20 MG/ML IV SOLN
INTRAVENOUS | Status: DC | PRN
  Administered 2014-06-26: 80 mg via INTRAVENOUS

## 2014-06-26 MED ORDER — PROPOFOL 10 MG/ML IV BOLUS
INTRAVENOUS | Status: DC | PRN
  Administered 2014-06-26: 200 mg via INTRAVENOUS

## 2014-06-26 MED ORDER — NITROGLYCERIN 0.4 MG SL SUBL: 0.4000 mg | SUBLINGUAL_TABLET | SUBLINGUAL | Status: DC | PRN

## 2014-06-26 SURGICAL SUPPLY — 55 items
ADH SKN CLS APL DERMABOND .7 (GAUZE/BANDAGES/DRESSINGS) ×1
BANDAGE ELASTIC 4 VELCRO ST LF (GAUZE/BANDAGES/DRESSINGS) IMPLANT
BANDAGE ESMARK 6X9 LF (GAUZE/BANDAGES/DRESSINGS) IMPLANT
BNDG CMPR 9X6 STRL LF SNTH (GAUZE/BANDAGES/DRESSINGS)
BNDG ESMARK 6X9 LF (GAUZE/BANDAGES/DRESSINGS)
CANISTER SUCTION 2500CC (MISCELLANEOUS) ×3 IMPLANT
CLIP TI MEDIUM 24 (CLIP) ×3 IMPLANT
CLIP TI WIDE RED SMALL 24 (CLIP) ×3 IMPLANT
COVER SURGICAL LIGHT HANDLE (MISCELLANEOUS) ×3 IMPLANT
CUFF TOURNIQUET SINGLE 24IN (TOURNIQUET CUFF) IMPLANT
CUFF TOURNIQUET SINGLE 34IN LL (TOURNIQUET CUFF) IMPLANT
CUFF TOURNIQUET SINGLE 44IN (TOURNIQUET CUFF) IMPLANT
DERMABOND ADVANCED (GAUZE/BANDAGES/DRESSINGS) ×2
DERMABOND ADVANCED .7 DNX12 (GAUZE/BANDAGES/DRESSINGS) ×1 IMPLANT
DRAIN CHANNEL 15F RND FF W/TCR (WOUND CARE) IMPLANT
DRAPE WARM FLUID 44X44 (DRAPE) ×3 IMPLANT
DRAPE X-RAY CASS 24X20 (DRAPES) IMPLANT
DRSG COVADERM 4X10 (GAUZE/BANDAGES/DRESSINGS) IMPLANT
DRSG COVADERM 4X8 (GAUZE/BANDAGES/DRESSINGS) IMPLANT
ELECT REM PT RETURN 9FT ADLT (ELECTROSURGICAL) ×3
ELECTRODE REM PT RTRN 9FT ADLT (ELECTROSURGICAL) ×1 IMPLANT
EVACUATOR SILICONE 100CC (DRAIN) IMPLANT
GLOVE BIOGEL PI IND STRL 7.5 (GLOVE) ×1 IMPLANT
GLOVE BIOGEL PI INDICATOR 7.5 (GLOVE) ×2
GLOVE SURG SS PI 7.5 STRL IVOR (GLOVE) ×3 IMPLANT
GOWN PREVENTION PLUS XXLARGE (GOWN DISPOSABLE) ×3 IMPLANT
GOWN STRL NON-REIN LRG LVL3 (GOWN DISPOSABLE) ×9 IMPLANT
HEMOSTAT SNOW SURGICEL 2X4 (HEMOSTASIS) IMPLANT
KIT BASIN OR (CUSTOM PROCEDURE TRAY) ×3 IMPLANT
KIT ROOM TURNOVER OR (KITS) ×3 IMPLANT
MARKER GRAFT CORONARY BYPASS (MISCELLANEOUS) IMPLANT
NS IRRIG 1000ML POUR BTL (IV SOLUTION) ×6 IMPLANT
PACK PERIPHERAL VASCULAR (CUSTOM PROCEDURE TRAY) ×3 IMPLANT
PAD ARMBOARD 7.5X6 YLW CONV (MISCELLANEOUS) ×6 IMPLANT
PADDING CAST COTTON 6X4 STRL (CAST SUPPLIES) IMPLANT
SET COLLECT BLD 21X3/4 12 (NEEDLE) IMPLANT
STOPCOCK 4 WAY LG BORE MALE ST (IV SETS) IMPLANT
SUT ETHILON 3 0 PS 1 (SUTURE) IMPLANT
SUT PROLENE 5 0 C 1 24 (SUTURE) ×3 IMPLANT
SUT PROLENE 6 0 BV (SUTURE) ×3 IMPLANT
SUT PROLENE 7 0 BV 1 (SUTURE) IMPLANT
SUT SILK 2 0 SH (SUTURE) ×3 IMPLANT
SUT SILK 3 0 (SUTURE)
SUT SILK 3-0 18XBRD TIE 12 (SUTURE) IMPLANT
SUT VIC AB 2-0 CT1 27 (SUTURE) ×6
SUT VIC AB 2-0 CT1 TAPERPNT 27 (SUTURE) ×2 IMPLANT
SUT VIC AB 3-0 SH 27 (SUTURE) ×6
SUT VIC AB 3-0 SH 27X BRD (SUTURE) ×2 IMPLANT
SUT VICRYL 4-0 PS2 18IN ABS (SUTURE) ×6 IMPLANT
TOWEL OR 17X24 6PK STRL BLUE (TOWEL DISPOSABLE) ×6 IMPLANT
TOWEL OR 17X26 10 PK STRL BLUE (TOWEL DISPOSABLE) ×6 IMPLANT
TRAY FOLEY CATH 16FRSI W/METER (SET/KITS/TRAYS/PACK) ×3 IMPLANT
TUBING EXTENTION W/L.L. (IV SETS) IMPLANT
UNDERPAD 30X30 INCONTINENT (UNDERPADS AND DIAPERS) ×3 IMPLANT
WATER STERILE IRR 1000ML POUR (IV SOLUTION) ×3 IMPLANT

## 2014-06-26 NOTE — Interval H&P Note (Signed)
History and Physical Interval Note:  06/26/2014 8:51 AM  Lucas [name]  has presented today for surgery, with the diagnosis of pvd w/ claudication  The various methods of treatment have been discussed with the patient and family. After consideration of risks, benefits and other options for treatment, the patient has consented to  Procedure(s): ABDOMINAL AORTAGRAM (N/A) as a surgical intervention .  The patient's history has been reviewed, patient examined, no change in status, stable for surgery.  I have reviewed the patient's chart and labs.  Questions were answered to the patient's satisfaction.     [facility] IV, V. WELLS

## 2014-06-26 NOTE — H&P (View-Only) (Signed)
Patient name: Lucas [name] MRN: [id number] DOB: [birth year] Sex: male     Chief Complaint  Patient presents with  . Re-evaluation    3 month f/u - pvd claudication    HISTORY OF PRESENT ILLNESS: Patient is back today for continued surveillance and follow-up of his right leg claudication.  He is status post angioplasty of a right external iliac artery stenosis on 12/26/2013.  This helped his symptoms somewhat, however he still suffers from lifestyle limiting claudication which affects his ability to walk for more than approximately 1 minute.  He also is now complaining of pain in his left thigh which occurs mostly when trying to stand up.  He has a long-standing history of tobacco abuse but has quit.  He still complains of cough and shortness of breath which he attributes to his COPD.  He has a history of heart failure and atrial fibrillation, for which he is on chronic anticoagulation.  He has gone coronary artery bypass grafting in the past.  He is asymptomatic.  He denies having any ulcers or rest pain.  He takes a statin for hypercholesterolemia.  He did not take cilostazol given his heart failure.  He did not have any significant benefit from Trental.  Past Medical History  Diagnosis Date  . CAD (coronary artery disease)     a. s/p CABG x 5;  b. 04/2012 Cath: patent LIMA->LAD and VG->OM3, all other grafts occluded. c. Cath 12/2013: patent SVG-OM3 & LIMA-LAD, known occ other VG.  Marland Kitchen Hypertension   . Adjustment disorder with anxiety   . Hyperlipidemia     a. Unable to take statins.  Marland Kitchen BPH (benign prostatic hypertrophy)   . COPD (chronic obstructive pulmonary disease)     a. on home O2.  . Arthritis   . Ischemic cardiomyopathy     a. 06/2013 Echo: EF 30-35%, no reg wma's, Gr2 DD, triv AI, mod dil LA, nl RV fxn.  . Chronic systolic CHF (congestive heart failure)     a. 06/2013 Echo: EF 30-35%. b. 11/08/13 EF 40-45%, LVH, HK of the mid-distalanterseptal myocardium, trivial Ao regurg,  calcified mitral annulus, RA mildly dilated  . Prostate cancer dx'd 2012  . Renal cancer dx'd 1997    lt nephrectomy  . CKD (chronic kidney disease), stage III   . Tobacco abuse     a. 54 yr hx as of 2015, transitioned to e-cig.  Marland Kitchen Syncope     a. 08/2013: the day following cath, no injury, had received multiple doses of Versed for anxiety - this was felt to be a contributing factor.  Marland Kitchen PAF (paroxysmal atrial fibrillation)     a. on Eliquis 5 mg bid  . Hypotension     a. H/o soft BP prohibiting ACEI/ARB use.  . Chronic pain   . PAD (peripheral artery disease)     a. 12/2013: PV angio s/p angioplasty to R external iliac artery stenosis, also has R SFA occlusion with reconstitution of above-knee popliteal artery, 2 vessel runoff via peroneal and posterior tib.b. LE doppler 02/2014 ABI right 0.52, left 0.95  . Pulmonary nodules     a. 12/2013:  small pulmonary nodules measuring up to 57mm in RM/LL, f/u recommended 6-12 months.  . Anxiety Dx 2004    Past Surgical History  Procedure Laterality Date  . Coronary artery bypass graft      x 5  . Cardiac catheterization    . Nephrectomy      left nephrectomy  for ca  . Posterior cervical laminectomy      x 8   limited ROM  and can't lie flat  . Heart stents      x 5  . Robot assisted laparoscopic radical prostatectomy      for prostate cancer  . Cystoscopy with litholapaxy  05/01/2012    Procedure: CYSTOSCOPY WITH LITHOLAPAXY;  Surgeon: Dutch Gray, MD;  Location: [facility];  Service: Urology;  Laterality: N/A;  . Prostate cancer    . Kidney surgery Left 1994    History   Social History  . Marital Status: Widowed    Spouse Name: N/A    Number of Children: N/A  . Years of Education: N/A   Occupational History  . disabled    Social History Main Topics  . Smoking status: Former Smoker -- 0.30 packs/day for 54 years    Types: Cigarettes    Quit date: 02/04/2014  . Smokeless tobacco: Former Systems developer    Quit date: 04/19/2012     Comment:  Using e-cig now  . Alcohol Use: No  . Drug Use: No  . Sexual Activity: Not Currently   Other Topics Concern  . Not on file   Social History Narrative    He lives in [place] by himself.  He is a widower.    He continues to smoke about 4 or 5  cigarettes a day but has a greater than 100 pack-year history having  previously smoked up to 3-4 packs a day over the span about 48 years.     He denies alcohol or drug use.  He is retired secondary to disability     since the 29s.     Family History  Problem Relation Age of Onset  . Diabetes Mother   . Heart disease Mother   . Hypertension Mother   . Heart attack Mother   . Coronary artery disease    . Heart disease Father   . Diabetes Father   . Heart attack Father   . Heart disease Brother   . Heart attack Brother   . COPD Sister     Allergies as of 05/27/2014 - Review Complete 05/27/2014  Allergen Reaction Noted  . Ativan [lorazepam] Other (See Comments) 04/21/2012  . Lisinopril Cough 08/22/2013  . Morphine and related Other (See Comments) 04/18/2012  . Ibuprofen Hives, Nausea And Vomiting, and Rash 04/17/2012    Current Outpatient Prescriptions on File Prior to Visit  Medication Sig Dispense Refill  . albuterol (PROVENTIL HFA;VENTOLIN HFA) 108 (90 BASE) MCG/ACT inhaler Inhale 2 puffs into the lungs every 6 (six) hours as needed for wheezing or shortness of breath. Shortness of breath 1 Inhaler 5  . albuterol (PROVENTIL) (2.5 MG/3ML) 0.083% nebulizer solution Take 3 mLs (2.5 mg total) by nebulization 4 (four) times daily. 75 mL 5  . ALPRAZolam (XANAX) 1 MG tablet Take 1 tablet (1 mg total) by mouth 2 (two) times daily as needed for anxiety. 30 tablet 2  . apixaban (ELIQUIS) 5 MG TABS tablet Take 1 tablet (5 mg total) by mouth 2 (two) times daily. 60 tablet 1  . aspirin EC 81 MG tablet Take 81 mg by mouth daily.    Marland Kitchen atorvastatin (LIPITOR) 40 MG tablet Take 1 tablet (40 mg total) by mouth daily at 6 PM. 30 tablet 0  .  doxazosin (CARDURA) 8 MG tablet Take 8 mg by mouth at bedtime.     . furosemide (LASIX) 20 MG tablet Take 40 mg by  mouth 2 (two) times daily.     Marland Kitchen HYDROcodone-acetaminophen (NORCO) 10-325 MG per tablet Take 1 tablet by mouth 5 (five) times daily. 40 tablet 0  . HYDROmorphone (DILAUDID) 4 MG tablet Take 1 tablet (4 mg total) by mouth every 6 (six) hours as needed for severe pain. 30 tablet 0  . ipratropium (ATROVENT) 0.02 % nebulizer solution Take 0.5 mg by nebulization 4 (four) times daily.    . Ipratropium-Albuterol (COMBIVENT RESPIMAT) 20-100 MCG/ACT AERS respimat Inhale 1 puff into the lungs 4 (four) times daily. 1 Inhaler 5  . isosorbide mononitrate (IMDUR) 30 MG 24 hr tablet Take 2 tablets (60 mg total) by mouth daily. 60 tablet 1  . mometasone-formoterol (DULERA) 100-5 MCG/ACT AERO Inhale 2 puffs into the lungs 2 (two) times daily. 1 Inhaler 0  . nitroGLYCERIN (NITROSTAT) 0.3 MG SL tablet Place 0.3 mg under the tongue every 5 (five) minutes as needed for chest pain.    . pantoprazole (PROTONIX) 40 MG tablet Take 1 tablet (40 mg total) by mouth daily at 12 noon. 30 tablet 1  . predniSONE (DELTASONE) 20 MG tablet Take 1 tablet (20 mg total) by mouth daily with breakfast. 16 tablet 0  . guaiFENesin (MUCINEX) 600 MG 12 hr tablet Take 2 tablets (1,200 mg total) by mouth 2 (two) times daily. 10 tablet 0  . guaiFENesin-dextromethorphan (ROBITUSSIN DM) 100-10 MG/5ML syrup Take 5 mLs by mouth every 4 (four) hours as needed for cough. 118 mL 0  . potassium chloride SA (K-DUR,KLOR-CON) 20 MEQ tablet Take 1 tablet (20 mEq total) by mouth daily. 30 tablet 1   No current facility-administered medications on file prior to visit.     REVIEW OF SYSTEMS: Cardiovascular: No chest pain, chest pressure, palpitations, orthopnea, or dyspnea on exertion.right leg claudication  No history of DVT or phlebitis. Pulmonary: positive productive cough Neurologic: No weakness, paresthesias, aphasia, or amaurosis. No  dizziness. Hematologic: No bleeding problems or clotting disorders. Musculoskeletal: No joint pain or joint swelling. Gastrointestinal: No blood in stool or hematemesis Genitourinary: No dysuria or hematuria. Psychiatric:: No history of major depression. Integumentary: No rashes or ulcers. Constitutional: No fever or chills.  PHYSICAL EXAMINATION:   Vital signs are BP 153/71 mmHg  Pulse 81  Ht 5' 8.5" (1.74 m)  Wt 220 lb (99.791 kg)  BMI 32.96 kg/m2  SpO2 97% General: The patient appears their stated age. HEENT:  No gross abnormalities Pulmonary:  Non labored breathing Abdomen: Soft and non-tender Musculoskeletal: There are no major deformities. Neurologic: No focal weakness or paresthesias are detected, Skin: There are no ulcer or rashes noted. Psychiatric: The patient has normal affect. Cardiovascular: There is a regular rate and rhythm without significant murmur appreciated.   Diagnostic Studies none  Assessment: Atherosclerosis with claudication, right greater than left Plan: The patient did not have an adequate response to medical management.  He has previously undergone a right iliac angioplasty.  He has a known occlusion of his right superficial femoral artery.  He cannot tolerate his level of disability.  We have decided to proceed with attempted subintimal recanalization of his right superficial femoral artery.  I will plan on accessing his left common femoral artery.  I will also evaluate his left leg and intervene on the inflow should he have had progression of his disease.  This is been scheduled for December 9.  I will discuss with cardiology their recommendations for anticoagulation cessation, in anticipation of his procedure  V. Leia Alf, M.D. [facility] [place] Office: [phone] Pager:  [phone]

## 2014-06-26 NOTE — Progress Notes (Signed)
S/p stent to R SFA/Pop, & PTA L EIA Monitored overnight for renal function given dye load today and solitary kidney  If creatinine OK in am, can be d/c'd home Restart anticoagulation tomorrow Follow up in 1 month already scheduled  Lucas [name]

## 2014-06-26 NOTE — Transfer of Care (Signed)
Immediate Anesthesia Transfer of Care Note  Patient: Lucas [name]  Procedure(s) Performed: Procedure(s): ABDOMINAL AORTAGRAM (N/A)  Patient Location: Cath Lab  Anesthesia Type:General  Level of Consciousness: awake, alert  and oriented  Airway & Oxygen Therapy: Patient Spontanous Breathing and Patient connected to nasal cannula oxygen  Post-op Assessment: Report given to PACU RN, Post -op Vital signs reviewed and stable and Patient moving all extremities  Post vital signs: Reviewed and stable  Complications: No apparent anesthesia complications

## 2014-06-26 NOTE — Op Note (Signed)
Patient name: Lucas [name] MRN: [id number] DOB: [birth year] Sex: male  06/26/2014 Pre-operative Diagnosis: Bilateral claudication Post-operative diagnosis:  Same Surgeon:  Eldridge Abrahams Procedure Performed:  1.  Ultrasound-guided access left femoral artery  2.  Abdominal aortogram  3.  Right lower extremity runoff  4.  Stent, right superficial femoral and popliteal artery  5.  Angioplasty, left external iliac artery   Indications:  The patient has previously been treated with external iliac angioplasty on the right.  He has developed progressive claudication symptoms which have become intolerable.  The patient requires general anesthesia.  Procedure:  The patient was identified in the holding area and taken to room 8.  The patient was then placed supine on the table and prepped and draped in the usual sterile fashion.  A time out was called.  Ultrasound was used to evaluate the left common femoral artery.  It was patent .  A digital ultrasound image was acquired.  A micropuncture needle was used to access the left common femoral artery under ultrasound guidance.  An 018 wire was advanced without resistance and a micropuncture sheath was placed.  The 018 wire was removed and a benson wire was placed.  The micropuncture sheath was exchanged for a 5 french sheath.  An omniflush catheter was advanced over the wire to the level of L-1.  An abdominal angiogram was obtained.  Next, using the omniflush catheter and a benson wire, the aortic bifurcation was crossed and the catheter was placed into theright external iliac artery and right runoff was obtained.    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  The previously treated area in the right external iliac artery is widely patent.  There is a 30 mm pressure gradient in the left external iliac artery.  Right Lower Extremity:  The right common femoral artery is patent  throughout its course as is the profunda femoral artery.  There is occlusion of the superficial femoral artery with reconstitution of the above-knee popliteal artery.  The dominant runoff vessel is the posterior tibial artery.  Peroneal artery is also patent.  Left Lower Extremity:  Not evaluated  Intervention:  After the above images were acquired, the decision was made to proceed with intervention.  A 7 French sheath was advanced into the right common femoral artery.  The patient was fully heparinized.  Subintimal recanalization was performed using a Glidewire and a quick cross catheter.  Reentry was in the above-knee popliteal artery.  This was confirmed with contrast.  A woolly wire was placed.  The subintimal tract was predilated with a 4 mm balloon.  I then proceeded with primary stenting.  The distal stent was a 6 x 1 50 Cordis, followed by a second 6 x 1 50 Cordis, followed by a 7 x 100 Cordis.  Stents were molded with a 5 mm balloon.  Completion imaging revealed in-line flow through the posterior tibial artery to the foot.  I did have to balloon a area of stent overlap with a 6 mm balloon.  I then checked a pullback gradient in the left external iliac artery.  There was a 30 mm gradient.  A 014 wire was then inserted and primary balloon angioplasty was performed using a angiosculpt 7 x 40 balloon taking this to nominal pressure for 2 minutes.  Completion angiogram revealed resolution of the stenosis.  At this point catheters and wires were removed.  The patient was  successfully extubated and taken to the recovery room in stable condition without any immediate complications.  Impression:  #1  successful subintimal recanalization and subsequent stenting of the right superficial femoral artery using 6 mm Cordis stent distally and a 7 x 100 stent proximally.  #2  runoff to the right leg is primarily via the posterior tibial artery.  The peroneal artery is also patent.  #3  successful angioplasty of  the left external iliac artery using a 7 x 40 angiosculpt balloon    V. Annamarie Major, M.D. [facility] [place] Office: [phone] Pager:  [phone]

## 2014-06-26 NOTE — Anesthesia Preprocedure Evaluation (Addendum)
Anesthesia Evaluation  Patient identified by MRN, date of birth, ID band Patient awake    Reviewed: Allergy & Precautions, H&P , NPO status , Patient's Chart, lab work & pertinent test results  History of Anesthesia Complications Negative for: history of anesthetic complications  Airway Mallampati: III  TM Distance: >3 FB Neck ROM: Full    Dental  (+) Teeth Intact, Dental Advisory Given   Pulmonary sleep apnea and Oxygen sleep apnea , COPD COPD inhaler and oxygen dependent, former smoker,    + decreased breath sounds      Cardiovascular hypertension, Pt. on medications + CAD, + Past MI, + Cardiac Stents, + Peripheral Vascular Disease and +CHF Rhythm:Regular - Systolic murmurs    Neuro/Psych PSYCHIATRIC DISORDERS Anxiety negative neurological ROS     GI/Hepatic Neg liver ROS,   Endo/Other  Morbid obesity  Renal/GU Renal InsufficiencyRenal disease     Musculoskeletal  (+) Arthritis -,   Abdominal   Peds  Hematology negative hematology ROS (+)   Anesthesia Other Findings   Reproductive/Obstetrics                            Anesthesia Physical Anesthesia Plan  ASA: IV  Anesthesia Plan: General   Post-op Pain Management:    Induction: Intravenous  Airway Management Planned: LMA  Additional Equipment: None  Intra-op Plan:   Post-operative Plan: Extubation in OR  Informed Consent: I have reviewed the patients History and Physical, chart, labs and discussed the procedure including the risks, benefits and alternatives for the proposed anesthesia with the patient or authorized representative who has indicated his/her understanding and acceptance.   Dental advisory given  Plan Discussed with: CRNA, Anesthesiologist and Surgeon  Anesthesia Plan Comments:        Anesthesia Quick Evaluation

## 2014-06-26 NOTE — Progress Notes (Signed)
Pt has 12 beats of V-tach ,pt is asymptomatic in bed BP is 142/84,O2 saturation is 94% on O2 at  2LPM/[place].Dr. Kellie Simmering made aware without orders made.Will continue to monitor.

## 2014-06-26 NOTE — Anesthesia Postprocedure Evaluation (Signed)
  Anesthesia Post-op Note  Patient: Lucas [name]  Procedure(s) Performed: Procedure(s): ABDOMINAL AORTAGRAM (N/A)  Patient Location: PACU  Anesthesia Type:General  Level of Consciousness: awake  Airway and Oxygen Therapy: Patient Spontanous Breathing  Post-op Pain: mild  Post-op Assessment: Post-op Vital signs reviewed, Patient's Cardiovascular Status Stable, Respiratory Function Stable, Patent Airway, No signs of Nausea or vomiting and Pain level controlled  Post-op Vital Signs: Reviewed and stable  Last Vitals:  Filed Vitals:   06/26/14 1300  BP: 142/73  Pulse: 76  Temp:   Resp: 18    Complications: No apparent anesthesia complications

## 2014-06-26 NOTE — Progress Notes (Signed)
Site area: lt groin Site Prior to Removal:  Level 0 Pressure Applied For:25 minutes Manual:yes    Patient Status During Pull:  restless Post Pull Site:  Level 0 Post Pull Instructions Given:  By Nelda Severe rtr Post Pull Pulses Present: lt pt doppler Dressing Applied:  yes Bedrest begins @ 14:35:00 Comments:

## 2014-06-26 NOTE — Progress Notes (Signed)
Dr. Kellie Simmering paged per pt request for pain medication via iv. Sherrie Mustache 3:54 PM

## 2014-06-27 ENCOUNTER — Telehealth: Payer: Self-pay | Admitting: Internal Medicine

## 2014-06-27 ENCOUNTER — Telehealth: Payer: Self-pay | Admitting: Surgery

## 2014-06-27 DIAGNOSIS — I70213 Atherosclerosis of native arteries of extremities with intermittent claudication, bilateral legs: Secondary | ICD-10-CM | POA: Diagnosis not present

## 2014-06-27 LAB — CBC
HCT: 39.4 % (ref 39.0–52.0)
HEMOGLOBIN: 12.9 g/dL — AB (ref 13.0–17.0)
MCH: 30.4 pg (ref 26.0–34.0)
MCHC: 32.7 g/dL (ref 30.0–36.0)
MCV: 92.7 fL (ref 78.0–100.0)
Platelets: 152 10*3/uL (ref 150–400)
RBC: 4.25 MIL/uL (ref 4.22–5.81)
RDW: 16 % — ABNORMAL HIGH (ref 11.5–15.5)
WBC: 8.6 10*3/uL (ref 4.0–10.5)

## 2014-06-27 LAB — BASIC METABOLIC PANEL
Anion gap: 15 (ref 5–15)
BUN: 22 mg/dL (ref 6–23)
CHLORIDE: 103 meq/L (ref 96–112)
CO2: 25 mEq/L (ref 19–32)
Calcium: 8.8 mg/dL (ref 8.4–10.5)
Creatinine, Ser: 1.31 mg/dL (ref 0.50–1.35)
GFR calc Af Amer: 62 mL/min — ABNORMAL LOW (ref >90)
GFR calc non Af Amer: 54 mL/min — ABNORMAL LOW (ref >90)
GLUCOSE: 100 mg/dL — AB (ref 70–99)
Potassium: 3.6 mEq/L — ABNORMAL LOW (ref 3.7–5.3)
Sodium: 143 mEq/L (ref 137–147)

## 2014-06-27 MED ORDER — FUROSEMIDE 20 MG PO TABS: 40.0000 mg | ORAL_TABLET | Freq: Two times a day (BID) | ORAL | Status: DC

## 2014-06-27 MED ORDER — HYDROCODONE-ACETAMINOPHEN 10-325 MG PO TABS: 1.0000 | ORAL_TABLET | Freq: Three times a day (TID) | ORAL | Status: DC

## 2014-06-27 NOTE — Progress Notes (Signed)
Pt given discharge instructions, medication lists, follow up appointments, and when to call the doctor.  Pt verbalizes understanding. Pt given signs and symptoms of infection. Areesha Dehaven McClintock, RN    

## 2014-06-27 NOTE — Progress Notes (Signed)
Pt had several complaints this morning about not getting food, ice, or snacks for two days.  I spoke with outgoing RN and NT Maryjean Morn who was here yesterday and today. Both agree that he has been getting multiple snacks and ice changed at least three times daily. C/o pain are chronic and pt takes home medications. Pt feels it will be hard to go home alone. Pt ambulates in room but feels may not be able to at home. Pt resting with call bell within reach.  Will continue to monitor. Payton Emerald, RN

## 2014-06-27 NOTE — Telephone Encounter (Signed)
Called pt. Aware refill has been sent in. Nothing further needed

## 2014-06-27 NOTE — Telephone Encounter (Addendum)
-----   Message from Mena Goes, RN sent at 06/26/2014  1:09 PM EST ----- Regarding: schedule   ----- Message -----    From: Serafina Mitchell, MD    Sent: 06/26/2014  12:24 PM      To: [facility] Charge Pool  06/26/2014:  Surgeon:  Eldridge Abrahams Procedure Performed:  1.  Ultrasound-guided access left femoral artery  2.  Abdominal aortogram  3.  Right lower extremity runoff  4.  Stent, right superficial femoral and popliteal artery  5.  Angioplasty, left external iliac artery   Follow-up one month with duplex of bilateral lower extremity  and ABI  06/27/14: spoke with pt and mailed letters, dpm

## 2014-06-27 NOTE — Progress Notes (Signed)
Pt ambulating in room and in hallway without aide.  States he uses a cane at home.  Pt given rolling walker to ambulate in hallway independently. Pt states he needs "to get the kinks out".  Will continue to monitor. Payton Emerald, RN

## 2014-06-27 NOTE — Discharge Summary (Signed)
Discharge Summary    Lucas [name] 11/22/1945 68 y.o. male  [id number]  Admission Date: 06/26/2014  Discharge Date: 06/27/14  Physician: Serafina Mitchell, MD  Admission Diagnosis: pvd with claudication   HPI:   This is a 68 y.o. male back today for continued surveillance and follow-up of his right leg claudication. He is status post angioplasty of a right external iliac artery stenosis on 12/26/2013. This helped his symptoms somewhat, however he still suffers from lifestyle limiting claudication which affects his ability to walk for more than approximately 1 minute. He also is now complaining of pain in his left thigh which occurs mostly when trying to stand up. He has a long-standing history of tobacco abuse but has quit. He still complains of cough and shortness of breath which he attributes to his COPD. He has a history of heart failure and atrial fibrillation, for which he is on chronic anticoagulation. He has gone coronary artery bypass grafting in the past. He is asymptomatic. He denies having any ulcers or rest pain. He takes a statin for hypercholesterolemia. He did not take cilostazol given his heart failure. He did not have any significant benefit from [place].  Hospital Course:  The patient was admitted to the hospital and taken to the operating room on 06/26/2014 and underwent: 1. Ultrasound-guided access left femoral artery 2. Abdominal aortogram 3. Right lower extremity runoff 4. Stent, right superficial femoral and popliteal artery 5. Angioplasty, left external iliac artery    The pt tolerated the procedure well and was transported to the PACU in good condition.   By POD 1, his creatinine was stable.  He was having some pain in his right posterior/medial thigh.  This did improve with adding his home pain medication and was ambulating well with the walker.  (He walks with a cane at home and states he  will use that and does not want a rolling walker).  He does have biphasic doppler signals in the bilateral PT's.  The remainder of the hospital course consisted of increasing mobilization and increasing intake of solids without difficulty.  CBC    Component Value Date/Time   WBC 8.6 06/27/2014 0336   RBC 4.25 06/27/2014 0336   HGB 12.9* 06/27/2014 0336   HCT 39.4 06/27/2014 0336   PLT 152 06/27/2014 0336   MCV 92.7 06/27/2014 0336   MCH 30.4 06/27/2014 0336   MCHC 32.7 06/27/2014 0336   RDW 16.0* 06/27/2014 0336   LYMPHSABS 1.2 05/13/2014 2140   MONOABS 0.7 05/13/2014 2140   EOSABS 0.1 05/13/2014 2140   BASOSABS 0.0 05/13/2014 2140    BMET    Component Value Date/Time   NA 143 06/27/2014 0336   K 3.6* 06/27/2014 0336   CL 103 06/27/2014 0336   CO2 25 06/27/2014 0336   GLUCOSE 100* 06/27/2014 0336   BUN 22 06/27/2014 0336   CREATININE 1.31 06/27/2014 0336   CREATININE 1.19 05/03/2014 1159   CALCIUM 8.8 06/27/2014 0336   GFRNONAA 54* 06/27/2014 0336   GFRAA 62* 06/27/2014 0336     Discharge Instructions:   The patient is discharged with extensive instructions on wound care and progressive ambulation.  They are instructed not to drive or perform any heavy lifting until returning to see the physician in his office.      Discharge Instructions    Call MD for:  redness, tenderness, or signs of infection (pain, swelling, bleeding, redness, odor or green/yellow discharge around incision site)    Complete by:  As  directed      Call MD for:  severe or increased pain, loss or decreased feeling  in affected limb(s)    Complete by:  As directed      Call MD for:  temperature >100.5    Complete by:  As directed      Discharge wound care:    Complete by:  As directed   Shower daily with soap and water starting 06/28/14     Driving Restrictions    Complete by:  As directed   No driving for 48 hours and while taking pain medication.     Lifting restrictions    Complete by:   As directed   No lifting for 4 weeks     Resume previous diet    Complete by:  As directed            Discharge Diagnosis:  pvd with claudication  Secondary Diagnosis: Patient Active Problem List   Diagnosis Date Noted  . Atheroscler nonbiologic bypass graft extremity w/intermit claudication 06/26/2014  . HTN (hypertension) 06/18/2014  . Lung nodule 06/10/2014  . Preoperative respiratory examination 06/10/2014  . Chest pain 05/16/2014  . Chronic anticoagulation 05/16/2014  . Other chest pain   . Acute-on-chronic kidney injury 05/15/2014  . COPD exacerbation 05/14/2014  . CAD (coronary artery disease) of artery bypass graft 05/14/2014  . Chronic kidney disease, stage 2, mildly decreased GFR 05/03/2014  . Chronic pain   . PAF (paroxysmal atrial fibrillation)   . History of renal cell cancer   . Chronic systolic CHF (congestive heart failure)   . CAD in native artery   . Pulmonary nodules 01/29/2014  . COPD, moderate 12/31/2013  . PAD (peripheral artery disease) 12/25/2013  . Atherosclerosis of native arteries of the extremities with intermittent claudication 12/10/2013  . OSA (obstructive sleep apnea) 06/13/2013  . Obesity 06/05/2012  . Adjustment disorder with anxiety 11/04/2008  . ADENOCARCINOMA, PROSTATE 09/23/2008  . SMOKER 09/23/2008  . Essential hypertension 09/23/2008   Past Medical History  Diagnosis Date  . CAD (coronary artery disease)     a. s/p CABG x 5;  b. 04/2012 Cath: patent LIMA->LAD and VG->OM3, all other grafts occluded. c. Cath 12/2013: patent SVG-OM3 & LIMA-LAD, known occ other VG.  Marland Kitchen Hypertension   . Adjustment disorder with anxiety   . Hyperlipidemia     a. Unable to take statins.  Marland Kitchen BPH (benign prostatic hypertrophy)   . COPD (chronic obstructive pulmonary disease)     a. on home O2.  . Arthritis   . Ischemic cardiomyopathy     a. 06/2013 Echo: EF 30-35%, no reg wma's, Gr2 DD, triv AI, mod dil LA, nl RV fxn.  . Chronic systolic CHF  (congestive heart failure)     a. 06/2013 Echo: EF 30-35%. b. 11/08/13 EF 40-45%, LVH, HK of the mid-distalanterseptal myocardium, trivial Ao regurg, calcified mitral annulus, RA mildly dilated  . Prostate cancer dx'd 2012  . Renal cancer dx'd 1997    lt nephrectomy  . CKD (chronic kidney disease), stage III   . Tobacco abuse     a. 81 yr hx as of 2015, transitioned to e-cig.  Marland Kitchen Syncope     a. 08/2013: the day following cath, no injury, had received multiple doses of Versed for anxiety - this was felt to be a contributing factor.  Marland Kitchen PAF (paroxysmal atrial fibrillation)     a. on Eliquis 5 mg bid  . Hypotension     a. H/o soft  BP prohibiting ACEI/ARB use.  . Chronic pain   . PAD (peripheral artery disease)     a. 12/2013: PV angio s/p angioplasty to R external iliac artery stenosis, also has R SFA occlusion with reconstitution of above-knee popliteal artery, 2 vessel runoff via peroneal and posterior tib.b. LE doppler 02/2014 ABI right 0.52, left 0.95  . Pulmonary nodules     a. 12/2013:  small pulmonary nodules measuring up to 61mm in RM/LL, f/u recommended 6-12 months.  . Anxiety Dx 2004       Medication List    TAKE these medications        albuterol 108 (90 BASE) MCG/ACT inhaler  Commonly known as:  PROVENTIL HFA;VENTOLIN HFA  Inhale 2 puffs into the lungs every 6 (six) hours as needed for wheezing or shortness of breath. Shortness of breath     albuterol (2.5 MG/3ML) 0.083% nebulizer solution  Commonly known as:  PROVENTIL  Take 3 mLs (2.5 mg total) by nebulization 4 (four) times daily. Dx J44.1     ALPRAZolam 1 MG tablet  Commonly known as:  XANAX  Take 1 tablet (1 mg total) by mouth 2 (two) times daily as needed for anxiety.     apixaban 5 MG Tabs tablet  Commonly known as:  ELIQUIS  Take 1 tablet (5 mg total) by mouth 2 (two) times daily.     aspirin EC 81 MG tablet  Take 81 mg by mouth daily.     doxazosin 8 MG tablet  Commonly known as:  CARDURA  Take 1 tablet (8  mg total) by mouth at bedtime.     Fluticasone Furoate-Vilanterol 100-25 MCG/INH Aepb  Commonly known as:  BREO ELLIPTA  Inhale 1 puff into the lungs every morning.     furosemide 20 MG tablet  Commonly known as:  LASIX  Take 2 tablets (40 mg total) by mouth 2 (two) times daily.     guaiFENesin 600 MG 12 hr tablet  Commonly known as:  MUCINEX  Take 2 tablets (1,200 mg total) by mouth 2 (two) times daily.     HYDROcodone-acetaminophen 10-325 MG per tablet  Commonly known as:  NORCO  Take 1 tablet by mouth 3 (three) times daily.     HYDROmorphone 4 MG tablet  Commonly known as:  DILAUDID  Take 4 mg by mouth 3 (three) times daily as needed for moderate pain.     ipratropium 0.02 % nebulizer solution  Commonly known as:  ATROVENT  Take 0.5 mg by nebulization 4 (four) times daily.     Ipratropium-Albuterol 20-100 MCG/ACT Aers respimat  Commonly known as:  COMBIVENT RESPIMAT  Inhale 1 puff into the lungs 4 (four) times daily.     mometasone-formoterol 100-5 MCG/ACT Aero  Commonly known as:  DULERA  Inhale 2 puffs into the lungs 2 (two) times daily.     nitroGLYCERIN 0.3 MG SL tablet  Commonly known as:  NITROSTAT  Place 0.3 mg under the tongue every 5 (five) minutes as needed for chest pain.     pantoprazole 40 MG tablet  Commonly known as:  PROTONIX  Take 1 tablet (40 mg total) by mouth daily at 12 noon.     predniSONE 10 MG tablet  Commonly known as:  STERAPRED UNI-PAK  Take 1-6 tablets by mouth daily. For 6 days. Ends 06/27/2015     predniSONE 10 MG tablet  Commonly known as:  DELTASONE  Take 1 tablet (10 mg total) by mouth daily.  Vicodin #15 No Refill  Disposition: home  Patient's condition: is Good  Follow up: 1. Dr. Trula Slade in 4 weeks   Leontine Locket, PA-C [facility] [phone] 06/27/2014  10:21 AM

## 2014-06-27 NOTE — Progress Notes (Signed)
  Progress Note    06/27/2014 7:52 AM 1 Day Post-Op  Subjective:  C/o pain in right posterior thigh---says he is having trouble walking  afebrile  Filed Vitals:   06/27/14 0411  BP: 120/63  Pulse: 64  Temp: 98.2 F (36.8 C)  Resp: 20    Physical Exam: Lungs:  Non labored Incisions:  Left groin is soft without hematoma Extremities:  Bilateral biphasic PT signals present; right and left thigh are soft and equal.  There is no ecchymosis.  CBC    Component Value Date/Time   WBC 8.6 06/27/2014 0336   RBC 4.25 06/27/2014 0336   HGB 12.9* 06/27/2014 0336   HCT 39.4 06/27/2014 0336   PLT 152 06/27/2014 0336   MCV 92.7 06/27/2014 0336   MCH 30.4 06/27/2014 0336   MCHC 32.7 06/27/2014 0336   RDW 16.0* 06/27/2014 0336   LYMPHSABS 1.2 05/13/2014 2140   MONOABS 0.7 05/13/2014 2140   EOSABS 0.1 05/13/2014 2140   BASOSABS 0.0 05/13/2014 2140    BMET    Component Value Date/Time   NA 143 06/27/2014 0336   K 3.6* 06/27/2014 0336   CL 103 06/27/2014 0336   CO2 25 06/27/2014 0336   GLUCOSE 100* 06/27/2014 0336   BUN 22 06/27/2014 0336   CREATININE 1.31 06/27/2014 0336   CREATININE 1.19 05/03/2014 1159   CALCIUM 8.8 06/27/2014 0336   GFRNONAA 54* 06/27/2014 0336   GFRAA 62* 06/27/2014 0336    INR    Component Value Date/Time   INR 1.17 04/18/2014 0433     Intake/Output Summary (Last 24 hours) at 06/27/14 0752 Last data filed at 06/27/14 0545  Gross per 24 hour  Intake      0 ml  Output   1350 ml  Net  -1350 ml     Assessment:  68 y.o. male is s/p:  1. Ultrasound-guided access left femoral artery 2. Abdominal aortogram 3. Right lower extremity runoff 4. Stent, right superficial femoral and popliteal artery 5. Angioplasty, left external iliac artery  1 Day Post-Op  Plan: -pt doing better later this am-pain medication helping.   -Dr. Kellie Simmering states to give pt a couple of weeks for this to  improve -Creatinine is stable today at 1.31--pre cath, it was 1.3 -DVT prophylaxis:  Pt's Eliquis restarted last pm -discharge home and f/u with Dr. Trula Slade in 4 weeks.  Leontine Locket, PA-C [facility] [phone] 06/27/2014 7:52 AM

## 2014-07-01 ENCOUNTER — Other Ambulatory Visit: Payer: Self-pay

## 2014-07-01 ENCOUNTER — Telehealth: Payer: Self-pay

## 2014-07-01 DIAGNOSIS — M7989 Other specified soft tissue disorders: Secondary | ICD-10-CM

## 2014-07-01 DIAGNOSIS — M79604 Pain in right leg: Secondary | ICD-10-CM

## 2014-07-01 NOTE — Telephone Encounter (Signed)
Phone call from pt.  Reported he has increase pain in the right upper, inner thigh that seems to have gotten a little worse since his procedure on 12/16.  Reported there is swelling in the right foot and ankle area, and swelling in the right upper thigh, at the area of pain.  Rates pain @ 5-6/ 10.  Stated the area is very sore, and he can hardly get up and down from the couch or chair.  Denies fever/ chills.  Denies redness or inflammation of the right thigh.  Stated he continues to have cramps in his right calf.  Discussed with Dr. Trula Slade.  Recommended to schedule pt. for Right LE Art. Duplex to check patency of the stent.   Appt. given 12/22 for 2:00 PM for vasc. study, and nurse practitioner @ 3:00 PM.  Agrees w/ plan.

## 2014-07-02 ENCOUNTER — Other Ambulatory Visit: Payer: Self-pay | Admitting: Surgery

## 2014-07-02 ENCOUNTER — Encounter: Payer: Self-pay | Admitting: Family

## 2014-07-02 ENCOUNTER — Ambulatory Visit (INDEPENDENT_AMBULATORY_CARE_PROVIDER_SITE_OTHER): Payer: Self-pay | Admitting: Family

## 2014-07-02 ENCOUNTER — Ambulatory Visit (HOSPITAL_COMMUNITY)
Admission: RE | Admit: 2014-07-02 | Discharge: 2014-07-02 | Disposition: A | Discharge status: Home / Self Care | Payer: Medicare Other | Source: Ambulatory Visit | Attending: Family | Admitting: Family

## 2014-07-02 VITALS — BP 147/75 | HR 88 | Temp 98.7°F | Resp 16 | Ht 68.5 in | Wt 225.0 lb

## 2014-07-02 DIAGNOSIS — M79604 Pain in right leg: Secondary | ICD-10-CM

## 2014-07-02 DIAGNOSIS — M79651 Pain in right thigh: Secondary | ICD-10-CM | POA: Diagnosis present

## 2014-07-02 DIAGNOSIS — I739 Peripheral vascular disease, unspecified: Secondary | ICD-10-CM | POA: Diagnosis not present

## 2014-07-02 DIAGNOSIS — M7989 Other specified soft tissue disorders: Secondary | ICD-10-CM

## 2014-07-02 DIAGNOSIS — Z9889 Other specified postprocedural states: Secondary | ICD-10-CM | POA: Insufficient documentation

## 2014-07-02 DIAGNOSIS — Z720 Tobacco use: Secondary | ICD-10-CM

## 2014-07-02 DIAGNOSIS — Z95820 Peripheral vascular angioplasty status with implants and grafts: Secondary | ICD-10-CM

## 2014-07-02 DIAGNOSIS — Z9862 Peripheral vascular angioplasty status: Secondary | ICD-10-CM

## 2014-07-02 DIAGNOSIS — F17200 Nicotine dependence, unspecified, uncomplicated: Secondary | ICD-10-CM

## 2014-07-02 NOTE — Patient Instructions (Signed)

## 2014-07-02 NOTE — Progress Notes (Signed)
[facility] [place] HISTORY AND PHYSICAL -PAD  History of Present Illness Lucas [name] is a 68 y.o. male patient of Dr. Trula Slade who is S/P Abdominal Aortagram 06-26-14 with the following findings and interventions:           #1 successful subintimal recanalization and subsequent stenting of the right superficial femoral artery using 6 mm Cordis stent distally and a 7 x 100 stent proximally. #2 runoff to the right leg is primarily via the posterior tibial artery. The peroneal artery is also patent. #3 successful angioplasty of the left external iliac artery using a 7 x 40 angiosculpt balloon  He returns today with , c/o pain in right groin area to leg, states more painful since the 06/26/14 aortogram, level 7-/44for pain, + rest pain in right leg. Pt states he also has known severe lumbar spine issues, he states this is non operable. Is seen in pain clinic for chronic neck pain, shoulders pain. Pt denies non healing wounds.  He has a history of kidney and prostate cancer.  The patient denies New Medical or Surgical History.  Pt Diabetic: No Pt smoker: smoker  (1 cigarette/month, exposed to second hand smoke)  Pt meds include: Statin :No, caused myalgias ASA: Yes Other anticoagulants/antiplatelets: Eliquis, states he takes for PAD  Past Medical History  Diagnosis Date  . CAD (coronary artery disease)     a. s/p CABG x 5;  b. 04/2012 Cath: patent LIMA->LAD and VG->OM3, all other grafts occluded. c. Cath 12/2013: patent SVG-OM3 & LIMA-LAD, known occ other VG.  Marland Kitchen Hypertension   . Adjustment disorder with anxiety   . Hyperlipidemia     a. Unable to take statins.  Marland Kitchen BPH (benign prostatic hypertrophy)   . COPD (chronic obstructive pulmonary disease)     a. on home O2.  . Arthritis   . Ischemic cardiomyopathy     a. 06/2013 Echo: EF 30-35%, no reg wma's, Gr2 DD, triv AI, mod dil LA, nl RV fxn.  . Chronic systolic CHF  (congestive heart failure)     a. 06/2013 Echo: EF 30-35%. b. 11/08/13 EF 40-45%, LVH, HK of the mid-distalanterseptal myocardium, trivial Ao regurg, calcified mitral annulus, RA mildly dilated  . Prostate cancer dx'd 2012  . Renal cancer dx'd 1997    lt nephrectomy  . CKD (chronic kidney disease), stage III   . Tobacco abuse     a. 72 yr hx as of 2015, transitioned to e-cig.  Marland Kitchen Syncope     a. 08/2013: the day following cath, no injury, had received multiple doses of Versed for anxiety - this was felt to be a contributing factor.  Marland Kitchen PAF (paroxysmal atrial fibrillation)     a. on Eliquis 5 mg bid  . Hypotension     a. H/o soft BP prohibiting ACEI/ARB use.  . Chronic pain   . PAD (peripheral artery disease)     a. 12/2013: PV angio s/p angioplasty to R external iliac artery stenosis, also has R SFA occlusion with reconstitution of above-knee popliteal artery, 2 vessel runoff via peroneal and posterior tib.b. LE doppler 02/2014 ABI right 0.52, left 0.95  . Pulmonary nodules     a. 12/2013:  small pulmonary nodules measuring up to 71mm in RM/LL, f/u recommended 6-12 months.  . Anxiety Dx 2004    Social History History  Substance Use Topics  . Smoking status: Former Smoker -- 0.30 packs/day for 54 years    Types: Cigarettes  Quit date: 02/04/2014  . Smokeless tobacco: Former Systems developer    Quit date: 04/19/2012     Comment: Using e-cig now  . Alcohol Use: No    Family History Family History  Problem Relation Age of Onset  . Diabetes Mother   . Heart disease Mother   . Hypertension Mother   . Heart attack Mother   . Coronary artery disease    . Heart disease Father   . Diabetes Father   . Heart attack Father   . Heart disease Brother   . Heart attack Brother   . COPD Sister     Past Surgical History  Procedure Laterality Date  . Coronary artery bypass graft      x 5  . Cardiac catheterization    . Nephrectomy      left nephrectomy for ca  . Posterior cervical laminectomy       x 8   limited ROM  and can't lie flat  . Heart stents      x 5  . Robot assisted laparoscopic radical prostatectomy      for prostate cancer  . Cystoscopy with litholapaxy  05/01/2012    Procedure: CYSTOSCOPY WITH LITHOLAPAXY;  Surgeon: Dutch Gray, MD;  Location: [facility];  Service: Urology;  Laterality: N/A;  . Prostate cancer    . Kidney surgery Left 1994  . Left and right heart catheterization with coronary/graft angiogram N/A 04/24/2012    Procedure: LEFT AND RIGHT HEART CATHETERIZATION WITH Beatrix Fetters;  Surgeon: Sherren Mocha, MD;  Location: [facility] C[facility] LAB;  Service: Cardiovascular;  Laterality: N/A;  . Left and right heart catheterization with coronary angiogram N/A 08/21/2013    Procedure: LEFT AND RIGHT HEART CATHETERIZATION WITH CORONARY ANGIOGRAM;  Surgeon: Josue Hector, MD;  Location: [facility] CATH LAB;  Service: Cardiovascular;  Laterality: N/A;  . Lower extremity angiogram N/A 08/21/2013    Procedure: LOWER EXTREMITY ANGIOGRAM;  Surgeon: Josue Hector, MD;  Location: [facility] CATH LAB;  Service: Cardiovascular;  Laterality: N/A;  . Abdominal aortagram N/A 12/25/2013    Procedure: ABDOMINAL AORTAGRAM;  Surgeon: Serafina Mitchell, MD;  Location: [facility] CATH LAB;  Service: Cardiovascular;  Laterality: N/A;  . Left heart catheterization with coronary/graft angiogram N/A 04/19/2014    Procedure: LEFT HEART CATHETERIZATION WITH Beatrix Fetters;  Surgeon: Jettie Booze, MD;  Location: [facility] CATH LAB;  Service: Cardiovascular;  Laterality: N/A;  . Abdominal aortagram N/A 06/26/2014    Procedure: ABDOMINAL AORTAGRAM;  Surgeon: Serafina Mitchell, MD;  Location: [facility] CATH LAB;  Service: Cardiovascular;  Laterality: N/A;    Allergies  Allergen Reactions  . Ativan [Lorazepam] Other (See Comments)    Increases agitation, tolerates xanax  . Lisinopril Cough  . Morphine And Related Other (See Comments)    Hallucinations, too sedated when given with ativan  . Ibuprofen Hives, Nausea And  Vomiting and Rash    Tolerates baby aspirin per patient.  There is no allergy to any dose of aspirin. Pt refuses to take 325mg  because it caused nose bleeds.    Current Outpatient Prescriptions  Medication Sig Dispense Refill  . albuterol (PROVENTIL HFA;VENTOLIN HFA) 108 (90 BASE) MCG/ACT inhaler Inhale 2 puffs into the lungs every 6 (six) hours as needed for wheezing or shortness of breath. Shortness of breath 1 Inhaler 5  . albuterol (PROVENTIL) (2.5 MG/3ML) 0.083% nebulizer solution Take 3 mLs (2.5 mg total) by nebulization 4 (four) times daily. Dx J44.1 360 mL 5  . ALPRAZolam (XANAX) 1 MG  tablet Take 1 tablet (1 mg total) by mouth 2 (two) times daily as needed for anxiety. (Patient taking differently: Take 1 mg by mouth 3 (three) times daily as needed for anxiety. ) 60 tablet 2  . apixaban (ELIQUIS) 5 MG TABS tablet Take 1 tablet (5 mg total) by mouth 2 (two) times daily. 60 tablet 1  . aspirin EC 81 MG tablet Take 81 mg by mouth daily.    Marland Kitchen doxazosin (CARDURA) 8 MG tablet Take 1 tablet (8 mg total) by mouth at bedtime. 60 tablet 0  . Fluticasone Furoate-Vilanterol (BREO ELLIPTA) 100-25 MCG/INH AEPB Inhale 1 puff into the lungs every morning. 1 each 6  . furosemide (LASIX) 20 MG tablet Take 2 tablets (40 mg total) by mouth 2 (two) times daily. 120 tablet 1  . guaiFENesin (MUCINEX) 600 MG 12 hr tablet Take 2 tablets (1,200 mg total) by mouth 2 (two) times daily. 10 tablet 0  . HYDROcodone-acetaminophen (NORCO) 10-325 MG per tablet Take 1 tablet by mouth 3 (three) times daily. 15 tablet 0  . HYDROmorphone (DILAUDID) 4 MG tablet Take 4 mg by mouth 3 (three) times daily as needed for moderate pain.     Marland Kitchen ipratropium (ATROVENT) 0.02 % nebulizer solution Take 0.5 mg by nebulization 4 (four) times daily.    . Ipratropium-Albuterol (COMBIVENT RESPIMAT) 20-100 MCG/ACT AERS respimat Inhale 1 puff into the lungs 4 (four) times daily. 1 Inhaler 5  . mometasone-formoterol (DULERA) 100-5 MCG/ACT AERO Inhale  2 puffs into the lungs 2 (two) times daily. 1 Inhaler 5  . nitroGLYCERIN (NITROSTAT) 0.3 MG SL tablet Place 0.3 mg under the tongue every 5 (five) minutes as needed for chest pain.    . pantoprazole (PROTONIX) 40 MG tablet Take 1 tablet (40 mg total) by mouth daily at 12 noon. 30 tablet 1  . predniSONE (DELTASONE) 10 MG tablet Take 1 tablet (10 mg total) by mouth daily. 30 tablet 5  . predniSONE (DELTASONE) 20 MG tablet     . predniSONE (STERAPRED UNI-PAK) 10 MG tablet Take 1-6 tablets by mouth daily. For 6 days. Ends 06/27/2015     No current facility-administered medications for this visit.    ROS: See HPI for pertinent positives and negatives.   Physical Examination  Filed Vitals:   07/02/14 1540  BP: 147/75  Pulse: 88  Temp: 98.7 F (37.1 C)  TempSrc: Oral  Resp: 16  Height: 5' 8.5" (1.74 m)  Weight: 225 lb (102.059 kg)  SpO2: 94%   Body mass index is 33.71 kg/(m^2).  General: A&O x 3, WDWN, obese male, hygienically challenged, cigarette smell. Gait: limp Eyes: PERRLA. Pulmonary: CTAB but with diminished air movement, without wheezes , rales or rhonchi. Cardiac: regular Rythm , without detected murmur.         Carotid Bruits Right Left   Negative Negative  Aorta is not palpable. Radial pulses: are 2+ palpable and =                           VASCULAR EXAM: Extremities without ischemic changes  without Gangrene; without open wounds.  LE Pulses Right Left       FEMORAL  not palpable  not palpable        POPLITEAL  not palpable   not palpable       POSTERIOR TIBIAL  not palpable   not palpable        DORSALIS PEDIS      ANTERIOR TIBIAL not palpable  not palpable    Abdomen: soft, NT, no palpable masses. Skin: no rashes, no ulcers. Musculoskeletal: no muscle wasting or atrophy.  Neurologic: A&O X 3; Appropriate Affect ; SENSATION: normal; MOTOR FUNCTION:   moving all extremities equally, motor strength 5/5 in U. Speech is fluent/normal. CN 2-12 intact.    Non-Invasive Vascular Imaging: DATE: 07/02/2014 LOWER EXTREMITY ARTERIAL EVALUATION    INDICATION: Right leg pain and thigh swelling    PREVIOUS INTERVENTION(S): Right sfa/pop stent 06/26/2014.    DUPLEX EXAM:     RIGHT  LEFT   Peak Systolic Velocity (cm/s) Ratio (if abnormal) Waveform  Peak Systolic Velocity (cm/s) Ratio (if abnormal) Waveform  130  T Artery - Proximal to Stent     68  B Stent - Origin     0  occluded Stent - Proximal     0  occluded Stent - Mid     11  M Stent - Distal     71  M  Stent - End     26/39/27/35  M/M/M/M Artery - Distal to Stent     0.60/0.61 Today's ABI / TBI 0.88/0.70  0.52/0.29 Previous ABI / TBI (02/18/2014 ) 0.95/0.62    Waveform:    M - Monophasic       B - Biphasic       T - Triphasic  If Ankle Brachial Index (ABI) or Toe Brachial Index (TBI) performed, please see complete report  ADDITIONAL FINDINGS:     IMPRESSION: Patent right arterial inflow. No color or spectral Doppler waveform can be obtained involving the right proximal to mid/distal superficial femoral artery/stent, suggestive of vessel occlusion. Monophasic Doppler waveforms present involving arterial outflow without hemodynamically significant stenosis visualized.    Compared to the previous exam:  Minimally improved right ankle brachial index and decreased left since study on 02/18/2014.     ASSESSMENT: Lucas [name] is a 68 y.o. male who is s/p Right sfa/pop stent on 06/26/2014. He presents with increased pain in right LE from groin to calf since 06/26/14. However, right ABI is minimally improved since study on 02/18/14, left ABI has decreased. Today's right LE arterial Duplex reveals a patent right arterial inflow. No color or spectral Doppler waveform can be obtained involving the right proximal to mid/distal superficial femoral artery/stent, suggestive of vessel  occlusion. Monophasic Doppler waveforms present involving arterial outflow without hemodynamically significant stenosis visualized.  Pt states he also has known severe lumbar spine issues, he states this is non operable. Is seen in pain clinic for chronic neck pain, shoulders pain, radiculooathy. He has no tissue loss. Right LE increased pain since the right SFA/pop stent placed could be secondary to nerve irritation from the stent placement which is expected to improve over time. His radiculopathy is a large part of this pain.  PLAN:  The patient was counseled re smoking cessation and given several free resources re smoking cessation.  I discussed in depth with the patient the nature of atherosclerosis, and emphasized the importance of maximal medical management including strict control of blood pressure, blood glucose, and lipid levels, obtaining regular exercise, and  cessation of smoking.  The patient is aware that without maximal medical management the underlying atherosclerotic disease process will progress, limiting the benefit of any interventions.  Based on the patient's vascular studies and examination, and after discussing with Dr. Kellie Simmering, and Dr. Kellie Simmering speaking with and examining pt, pt will return to clinic on 07/15/14 and see Dr. Trula Slade to discuss possible right LE bypass graft. The patient was given information about PAD including signs, symptoms, treatment, what symptoms should prompt the patient to seek immediate medical care, and risk reduction measures to take.  Clemon Chambers, RN, MSN, FNP-C [facility] Phone: [phone]  Clinic MD: Early  07/02/2014 3:58 PM

## 2014-07-09 ENCOUNTER — Telehealth: Payer: Self-pay | Admitting: Internal Medicine

## 2014-07-09 ENCOUNTER — Encounter: Payer: Self-pay | Admitting: Surgery

## 2014-07-09 MED ORDER — PREDNISONE 10 MG PO TABS: 10.0000 mg | ORAL_TABLET | Freq: Every day | ORAL | Status: DC

## 2014-07-09 NOTE — Telephone Encounter (Signed)
Called and spoke with pt and he is aware of refill of the prednisone that has been sent to the pharmacy.  Nothing further is needed.   

## 2014-07-15 ENCOUNTER — Telehealth: Payer: Self-pay

## 2014-07-15 ENCOUNTER — Encounter: Payer: Self-pay | Admitting: Surgery

## 2014-07-15 ENCOUNTER — Ambulatory Visit (INDEPENDENT_AMBULATORY_CARE_PROVIDER_SITE_OTHER): Payer: Medicare Other | Admitting: Surgery

## 2014-07-15 ENCOUNTER — Other Ambulatory Visit: Payer: Self-pay

## 2014-07-15 ENCOUNTER — Telehealth: Payer: Self-pay | Admitting: Cardiovascular Disease

## 2014-07-15 VITALS — BP 157/82 | HR 81 | Ht 68.5 in | Wt 227.0 lb

## 2014-07-15 DIAGNOSIS — I70219 Atherosclerosis of native arteries of extremities with intermittent claudication, unspecified extremity: Secondary | ICD-10-CM | POA: Diagnosis not present

## 2014-07-15 NOTE — Telephone Encounter (Signed)
Pt. Notified of approval by Dr. Johnsie Cancel to hold Eliquis x 3 days, prior to surgery.  Advised to take Eliquis up to an including dose on 1/11, and then start holding on 1/12.  Verb. Understanding.

## 2014-07-15 NOTE — Telephone Encounter (Signed)
Ok to have surgery having incapacitating pain known moderate risk both from lung and heart perspective

## 2014-07-15 NOTE — Telephone Encounter (Signed)
Forwarding to Dr. Johnsie Cancel to address tomorrow when returns to office.   For Dr. Johnsie Cancel -- here is your documentation when you saw patient last month:  PAD (peripheral artery disease) - Josue Hector, MD at 06/18/2014 9:02 AM     Status: Written Related Problem: PAD (peripheral artery disease)   Expand All Collapse All   Dr Trula Slade to attempt ? Total occlusion atherectomy suspect he may need to convert to open procedure Moderate risk open surgery due to CAD and COPD with recent exacerbation Will hold coumadin 4 days before procedure

## 2014-07-15 NOTE — Telephone Encounter (Signed)
-----   Message from Josue Hector, MD sent at 07/15/2014 12:34 PM EST ----- Regarding: RE: holding Eliquis/ Cardiac Clearance Yes can hold eliquis  ----- Message -----    From: Sherrye Payor, RN    Sent: 07/15/2014  11:16 AM      To: Josue Hector, MD Subject: holding Eliquis/ Cardiac Clearance             Please advise if pt. is cleared from Cardiology standpoint, and can hold Eliquis 3 days prior to Right Fem-Pop BP; scheduled for 07/26/14.

## 2014-07-15 NOTE — Telephone Encounter (Signed)
New Message       Patient is having surgery on 07/26/14 and needs surgical clearance, Lucas [name] from [facility] needs to know if they need an additional appt and if so please schedule.   Fax: [phone]

## 2014-07-15 NOTE — Telephone Encounter (Signed)
Faxed copy of this encounter to [facility].

## 2014-07-15 NOTE — Progress Notes (Signed)
Patient name: Lucas [name] MRN: [id number] DOB: 03/31/1946 Sex: male     Chief Complaint  Patient presents with  . Re-evaluation    discuss Rt LE BP    HISTORY OF PRESENT ILLNESS: Patient is back today for continued surveillance and follow-up of his right leg claudication. He is status post angioplasty of a right external iliac artery stenosis on 12/26/2013. This helped his symptoms somewhat, however he still suffers from lifestyle limiting claudication which affects his ability to walk for more than approximately 1 minute. He also is now complaining of pain in his left thigh which occurs mostly when trying to stand up. He has a long-standing history of tobacco abuse but has quit. He still complains of cough and shortness of breath which he attributes to his COPD. He has a history of heart failure and atrial fibrillation, for which he is on chronic anticoagulation. He has gone coronary artery bypass grafting in the past. He is asymptomatic. He denies having any ulcers or rest pain. He takes a statin for hypercholesterolemia. He did not take cilostazol given his heart failure. He did not have any significant benefit   The patient recently underwent subintimal recanalization and stenting of an occluded right popliteal artery.  He began having discomfort in his right thigh following stent deployment.  This was attributed to the stents, however he came back to the office 2 weeks later an ultrasound identified that his stents were occluded.  He continues to have symptoms that he cannot tolerate.  Past Medical History  Diagnosis Date  . CAD (coronary artery disease)     a. s/p CABG x 5;  b. 04/2012 Cath: patent LIMA->LAD and VG->OM3, all other grafts occluded. c. Cath 12/2013: patent SVG-OM3 & LIMA-LAD, known occ other VG.  Marland Kitchen Hypertension   . Adjustment disorder with anxiety   . Hyperlipidemia     a. Unable to take statins.  Marland Kitchen BPH (benign prostatic hypertrophy)   . COPD (chronic  obstructive pulmonary disease)     a. on home O2.  . Arthritis   . Ischemic cardiomyopathy     a. 06/2013 Echo: EF 30-35%, no reg wma's, Gr2 DD, triv AI, mod dil LA, nl RV fxn.  . Chronic systolic CHF (congestive heart failure)     a. 06/2013 Echo: EF 30-35%. b. 11/08/13 EF 40-45%, LVH, HK of the mid-distalanterseptal myocardium, trivial Ao regurg, calcified mitral annulus, RA mildly dilated  . Prostate cancer dx'd 2012  . Renal cancer dx'd 1997    lt nephrectomy  . CKD (chronic kidney disease), stage III   . Tobacco abuse     a. 60 yr hx as of 2015, transitioned to e-cig.  Marland Kitchen Syncope     a. 08/2013: the day following cath, no injury, had received multiple doses of Versed for anxiety - this was felt to be a contributing factor.  Marland Kitchen PAF (paroxysmal atrial fibrillation)     a. on Eliquis 5 mg bid  . Hypotension     a. H/o soft BP prohibiting ACEI/ARB use.  . Chronic pain   . PAD (peripheral artery disease)     a. 12/2013: PV angio s/p angioplasty to R external iliac artery stenosis, also has R SFA occlusion with reconstitution of above-knee popliteal artery, 2 vessel runoff via peroneal and posterior tib.b. LE doppler 02/2014 ABI right 0.52, left 0.95  . Pulmonary nodules     a. 12/2013:  small pulmonary nodules measuring up to 54mm in RM/LL, f/u  recommended 6-12 months.  . Anxiety Dx 2004    Past Surgical History  Procedure Laterality Date  . Coronary artery bypass graft      x 5  . Cardiac catheterization    . Nephrectomy      left nephrectomy for ca  . Posterior cervical laminectomy      x 8   limited ROM  and can't lie flat  . Heart stents      x 5  . Robot assisted laparoscopic radical prostatectomy      for prostate cancer  . Cystoscopy with litholapaxy  05/01/2012    Procedure: CYSTOSCOPY WITH LITHOLAPAXY;  Surgeon: Dutch Gray, MD;  Location: [facility];  Service: Urology;  Laterality: N/A;  . Prostate cancer    . Kidney surgery Left 1994  . Left and right heart catheterization  with coronary/graft angiogram N/A 04/24/2012    Procedure: LEFT AND RIGHT HEART CATHETERIZATION WITH Beatrix Fetters;  Surgeon: Sherren Mocha, MD;  Location: [facility] CATH LAB;  Service: Cardiovascular;  Laterality: N/A;  . Left and right heart catheterization with coronary angiogram N/A 08/21/2013    Procedure: LEFT AND RIGHT HEART CATHETERIZATION WITH CORONARY ANGIOGRAM;  Surgeon: Josue Hector, MD;  Location: [facility] CATH LAB;  Service: Cardiovascular;  Laterality: N/A;  . Lower extremity angiogram N/A 08/21/2013    Procedure: LOWER EXTREMITY ANGIOGRAM;  Surgeon: Josue Hector, MD;  Location: [facility] CATH LAB;  Service: Cardiovascular;  Laterality: N/A;  . Abdominal aortagram N/A 12/25/2013    Procedure: ABDOMINAL AORTAGRAM;  Surgeon: Serafina Mitchell, MD;  Location: [facility] CATH LAB;  Service: Cardiovascular;  Laterality: N/A;  . Left heart catheterization with coronary/graft angiogram N/A 04/19/2014    Procedure: LEFT HEART CATHETERIZATION WITH Beatrix Fetters;  Surgeon: Jettie Booze, MD;  Location: [facility] CATH LAB;  Service: Cardiovascular;  Laterality: N/A;  . Abdominal aortagram N/A 06/26/2014    Procedure: ABDOMINAL AORTAGRAM;  Surgeon: Serafina Mitchell, MD;  Location: [facility] CATH LAB;  Service: Cardiovascular;  Laterality: N/A;    History   Social History  . Marital Status: Widowed    Spouse Name: N/A    Number of Children: N/A  . Years of Education: N/A   Occupational History  . disabled    Social History Main Topics  . Smoking status: Former Smoker -- 0.30 packs/day for 54 years    Types: Cigarettes    Quit date: 02/04/2014  . Smokeless tobacco: Former Systems developer    Quit date: 04/19/2012     Comment: Using e-cig now  . Alcohol Use: No  . Drug Use: No  . Sexual Activity: Not Currently   Other Topics Concern  . Not on file   Social History Narrative    He lives in [place] by himself.  He is a widower.    He continues to smoke about 4 or 5  cigarettes a day but has a greater  than 100 pack-year history having  previously smoked up to 3-4 packs a day over the span about 48 years.     He denies alcohol or drug use.  He is retired secondary to disability     since the 75s.     Family History  Problem Relation Age of Onset  . Diabetes Mother   . Heart disease Mother   . Hypertension Mother   . Heart attack Mother   . Coronary artery disease    . Heart disease Father   . Diabetes Father   . Heart attack Father   .  Heart disease Brother   . Heart attack Brother   . COPD Sister     Allergies as of 07/15/2014 - Review Complete 07/15/2014  Allergen Reaction Noted  . Ativan [lorazepam] Other (See Comments) 04/21/2012  . Lisinopril Cough 08/22/2013  . Morphine and related Other (See Comments) 04/18/2012  . Ibuprofen Hives, Nausea And Vomiting, and Rash 04/17/2012    Current Outpatient Prescriptions on File Prior to Visit  Medication Sig Dispense Refill  . albuterol (PROVENTIL HFA;VENTOLIN HFA) 108 (90 BASE) MCG/ACT inhaler Inhale 2 puffs into the lungs every 6 (six) hours as needed for wheezing or shortness of breath. Shortness of breath 1 Inhaler 5  . albuterol (PROVENTIL) (2.5 MG/3ML) 0.083% nebulizer solution Take 3 mLs (2.5 mg total) by nebulization 4 (four) times daily. Dx J44.1 360 mL 5  . ALPRAZolam (XANAX) 1 MG tablet Take 1 tablet (1 mg total) by mouth 2 (two) times daily as needed for anxiety. (Patient taking differently: Take 1 mg by mouth 3 (three) times daily as needed for anxiety. ) 60 tablet 2  . apixaban (ELIQUIS) 5 MG TABS tablet Take 1 tablet (5 mg total) by mouth 2 (two) times daily. 60 tablet 1  . aspirin EC 81 MG tablet Take 81 mg by mouth daily.    Marland Kitchen doxazosin (CARDURA) 8 MG tablet Take 1 tablet (8 mg total) by mouth at bedtime. 60 tablet 0  . Fluticasone Furoate-Vilanterol (BREO ELLIPTA) 100-25 MCG/INH AEPB Inhale 1 puff into the lungs every morning. 1 each 6  . furosemide (LASIX) 20 MG tablet Take 2 tablets (40 mg total) by mouth 2 (two)  times daily. 120 tablet 1  . guaiFENesin (MUCINEX) 600 MG 12 hr tablet Take 2 tablets (1,200 mg total) by mouth 2 (two) times daily. 10 tablet 0  . HYDROcodone-acetaminophen (NORCO) 10-325 MG per tablet Take 1 tablet by mouth 3 (three) times daily. 15 tablet 0  . HYDROmorphone (DILAUDID) 4 MG tablet Take 4 mg by mouth 3 (three) times daily as needed for moderate pain.     Marland Kitchen ipratropium (ATROVENT) 0.02 % nebulizer solution Take 0.5 mg by nebulization 4 (four) times daily.    . Ipratropium-Albuterol (COMBIVENT RESPIMAT) 20-100 MCG/ACT AERS respimat Inhale 1 puff into the lungs 4 (four) times daily. 1 Inhaler 5  . mometasone-formoterol (DULERA) 100-5 MCG/ACT AERO Inhale 2 puffs into the lungs 2 (two) times daily. 1 Inhaler 5  . nitroGLYCERIN (NITROSTAT) 0.3 MG SL tablet Place 0.3 mg under the tongue every 5 (five) minutes as needed for chest pain.    . pantoprazole (PROTONIX) 40 MG tablet Take 1 tablet (40 mg total) by mouth daily at 12 noon. 30 tablet 1  . predniSONE (DELTASONE) 10 MG tablet Take 1 tablet (10 mg total) by mouth daily. 30 tablet 5  . predniSONE (DELTASONE) 20 MG tablet     . predniSONE (STERAPRED UNI-PAK) 10 MG tablet Take 1-6 tablets by mouth daily. For 6 days. Ends 06/27/2015     No current facility-administered medications on file prior to visit.     REVIEW OF SYSTEMS: Cardiovascular: No chest pain, chest pressure, palpitations, orthopnea, or dyspnea on exertion.  Right leg pain with minimal activity Pulmonary: Positive productive cough,  Neurologic: No weakness, paresthesias, aphasia, or amaurosis. No dizziness. Hematologic: No bleeding problems or clotting disorders. Musculoskeletal: No joint pain or joint swelling. Gastrointestinal: No blood in stool or hematemesis Genitourinary: No dysuria or hematuria. Psychiatric:: No history of major depression. Integumentary: No rashes or ulcers. Constitutional: No fever  or chills.  PHYSICAL EXAMINATION:   Vital signs are BP  157/82 mmHg  Pulse 81  Ht 5' 8.5" (1.74 m)  Wt 227 lb (102.967 kg)  BMI 34.01 kg/m2  SpO2 95% General: The patient appears their stated age. HEENT:  No gross abnormalities Pulmonary:  Non labored breathing Musculoskeletal: There are no major deformities. Neurologic: No focal weakness or paresthesias are detected, Skin: There are no ulcer or rashes noted. Psychiatric: The patient has normal affect. Cardiovascular: There is a regular rate and rhythm without significant murmur appreciated.   Diagnostic Studies None  Assessment: Severe right leg claudication Plan: I discussed our treatment options with the patient which include continued medical therapy versus surgical revascularization.  The patient states that he cannot tolerate the level of discomfort in his right leg which happens with minimal activity.  His vein has been utilized for CABG.  Therefore, he will need a right femoral to below knee popliteal artery bypass graft using PTFE.  We discussed the long-term patency of the grafts, particularly with his comorbidities.  He wishes to proceed.  This is been scheduled for Friday, January 15.  He will need to be off of anticoagulation prior to his operation.  I will confirm cardiac clearance with Dr. Mila Palmer. Leia Alf, M.D. [facility] Office: [phone] Pager:  [phone]

## 2014-07-22 ENCOUNTER — Telehealth: Payer: Self-pay | Admitting: Cardiovascular Disease

## 2014-07-22 NOTE — Pre-Procedure Instructions (Addendum)
Lucas [name]  07/22/2014   Your procedure is scheduled on:  Friday, January 15.  Report to [facility] Admitting at 6:00 AM.  Call this number if you have problems the morning of surgery: [phone]   Remember:   Do not eat food or drink liquids after midnight Thursday, January 14.   Take these medicines the morning of surgery with A SIP OF WATER:  Aspirin,doxazosin (CARDURA), guaiFENesin (MUCINEX), pantoprazole (PROTONIX).  May use inhalers.  Bring Albuterol inhaler to the hospital.             Take if needed: Xanax and pain medication ,nitro , if needed.    STOP all herbel meds, nsaids (aleve,naproxen,advil,ibuprofen) 5 days prior to surgery  STOP eliquis on 1.12/16 per dr    Aspirin as directed by dr(continue)   Do not wear jewelry, make-up or nail polish.  Do not wear lotions, powders, or perfumes.    Men may shave face and neck.  Do not bring valuables to the hospital.              [facility] is not responsible  for any belongings or valuables.               Contacts, dentures or bridgework may not be worn into surgery.  Leave suitcase in the car. After surgery it may be brought to your room.  For patients admitted to the hospital, discharge time is determined by your treatment team.    Special Instructions:  Special Instructions: [place] - Preparing for Surgery  Before surgery, you can play an important role.  Because skin is not sterile, your skin needs to be as free of germs as possible.  You can reduce the number of germs on you skin by washing with CHG (chlorahexidine gluconate) soap before surgery.  CHG is an antiseptic cleaner which kills germs and bonds with the skin to continue killing germs even after washing.  Please DO NOT use if you have an allergy to CHG or antibacterial soaps.  If your skin becomes reddened/irritated stop using the CHG and inform your nurse when you arrive at Short Stay.  Do not shave (including legs and underarms) for at  least 48 hours prior to the first CHG shower.  You may shave your face.  Please follow these instructions carefully:   1.  Shower with CHG Soap the night before surgery and the morning of Surgery.  2.  If you choose to wash your hair, wash your hair first as usual with your normal shampoo.  3.  After you shampoo, rinse your hair and body thoroughly to remove the Shampoo.  4.  Use CHG as you would any other liquid soap.  You can apply chg directly  to the skin and wash gently with scrungie or a clean washcloth.  5.  Apply the CHG Soap to your body ONLY FROM THE NECK DOWN.  Do not use on open wounds or open sores.  Avoid contact with your eyes ears, mouth and genitals (private parts).  Wash genitals (private parts)       with your normal soap.  6.  Wash thoroughly, paying special attention to the area where your surgery will be performed.  7.  Thoroughly rinse your body with warm water from the neck down.  8.  DO NOT shower/wash with your normal soap after using and rinsing off the CHG Soap.  9.  Pat yourself dry with a clean towel.  10.  Wear clean pajamas.            11.  Place clean sheets on your bed the night of your first shower and do not sleep with pets.  Day of Surgery  Do not apply any lotions/deodorants the morning of surgery.  Please wear clean clothes to the hospital/surgery center.  Please read over the following fact sheets that you were given: Pain Booklet, Coughing and Deep Breathing, Blood Transfusion Information and Surgical Site Infection Prevention

## 2014-07-22 NOTE — Telephone Encounter (Signed)
Will forward to Dr. Kyla Balzarine nurse Devra Dopp.

## 2014-07-22 NOTE — Telephone Encounter (Signed)
New message    patient is schedule for surgery on  1/15. Right leg bypass .    clearance receive mark moderate risk. But not sign by Dr. Johnsie Cancel .   Yes / no box is not check for clearance.

## 2014-07-23 ENCOUNTER — Encounter (HOSPITAL_COMMUNITY)
Admission: RE | Admit: 2014-07-23 | Discharge: 2014-07-23 | Disposition: A | Discharge status: Home / Self Care | Payer: Medicare Other | Source: Ambulatory Visit | Attending: Surgery | Admitting: Surgery

## 2014-07-23 ENCOUNTER — Encounter (HOSPITAL_COMMUNITY): Payer: Self-pay

## 2014-07-23 DIAGNOSIS — Z01818 Encounter for other preprocedural examination: Secondary | ICD-10-CM

## 2014-07-23 DIAGNOSIS — I739 Peripheral vascular disease, unspecified: Secondary | ICD-10-CM

## 2014-07-23 DIAGNOSIS — Z01812 Encounter for preprocedural laboratory examination: Secondary | ICD-10-CM | POA: Insufficient documentation

## 2014-07-23 HISTORY — DX: Rheumatic fever without heart involvement: I00

## 2014-07-23 HISTORY — DX: Angina pectoris, unspecified: I20.9

## 2014-07-23 HISTORY — DX: Reserved for inherently not codable concepts without codable children: IMO0001

## 2014-07-23 LAB — URINALYSIS, ROUTINE W REFLEX MICROSCOPIC
Bilirubin Urine: NEGATIVE
GLUCOSE, UA: NEGATIVE mg/dL
Hgb urine dipstick: NEGATIVE
KETONES UR: NEGATIVE mg/dL
Leukocytes, UA: NEGATIVE
NITRITE: NEGATIVE
Protein, ur: NEGATIVE mg/dL
SPECIFIC GRAVITY, URINE: 1.019 (ref 1.005–1.030)
Urobilinogen, UA: 0.2 mg/dL (ref 0.0–1.0)
pH: 5.5 (ref 5.0–8.0)

## 2014-07-23 LAB — CBC
HCT: 42.9 % (ref 39.0–52.0)
HEMOGLOBIN: 13.6 g/dL (ref 13.0–17.0)
MCH: 29.8 pg (ref 26.0–34.0)
MCHC: 31.7 g/dL (ref 30.0–36.0)
MCV: 93.9 fL (ref 78.0–100.0)
Platelets: 144 10*3/uL — ABNORMAL LOW (ref 150–400)
RBC: 4.57 MIL/uL (ref 4.22–5.81)
RDW: 16 % — AB (ref 11.5–15.5)
WBC: 9.4 10*3/uL (ref 4.0–10.5)

## 2014-07-23 LAB — COMPREHENSIVE METABOLIC PANEL
ALBUMIN: 3.9 g/dL (ref 3.5–5.2)
ALT: 22 U/L (ref 0–53)
ANION GAP: 10 (ref 5–15)
AST: 26 U/L (ref 0–37)
Alkaline Phosphatase: 55 U/L (ref 39–117)
BUN: 24 mg/dL — AB (ref 6–23)
CO2: 25 mmol/L (ref 19–32)
CREATININE: 1.33 mg/dL (ref 0.50–1.35)
Calcium: 9.7 mg/dL (ref 8.4–10.5)
Chloride: 105 mEq/L (ref 96–112)
GFR calc Af Amer: 61 mL/min — ABNORMAL LOW (ref >90)
GFR calc non Af Amer: 53 mL/min — ABNORMAL LOW (ref >90)
Glucose, Bld: 109 mg/dL — ABNORMAL HIGH (ref 70–99)
Potassium: 3.8 mmol/L (ref 3.5–5.1)
Sodium: 140 mmol/L (ref 135–145)
Total Bilirubin: 0.6 mg/dL (ref 0.3–1.2)
Total Protein: 7 g/dL (ref 6.0–8.3)

## 2014-07-23 LAB — ABO/RH: ABO/RH(D): A POS

## 2014-07-23 LAB — PROTIME-INR
INR: 1.1 (ref 0.00–1.49)
Prothrombin Time: 14.3 seconds (ref 11.6–15.2)

## 2014-07-23 LAB — TYPE AND SCREEN
ABO/RH(D): A POS
ANTIBODY SCREEN: NEGATIVE

## 2014-07-23 LAB — SURGICAL PCR SCREEN
MRSA, PCR: NEGATIVE
Staphylococcus aureus: NEGATIVE

## 2014-07-23 LAB — APTT: aPTT: 29 seconds (ref 24–37)

## 2014-07-23 NOTE — Telephone Encounter (Signed)
RECEIVED STAFF  MESSAGE  THAT  CAROL   HAS  REVIEWED PREVIOUS  MESSAGE  THAT  HAS  CLEARED PT  .Adonis Housekeeper

## 2014-07-23 NOTE — Progress Notes (Signed)
To continue aspirin and also take day of surgery per carol at office

## 2014-07-23 NOTE — Telephone Encounter (Signed)
LMTCB ./CY 

## 2014-07-24 ENCOUNTER — Other Ambulatory Visit (HOSPITAL_COMMUNITY): Payer: Medicare Other

## 2014-07-24 ENCOUNTER — Encounter (HOSPITAL_COMMUNITY): Payer: Medicare Other

## 2014-07-24 NOTE — Progress Notes (Signed)
Anesthesia Chart Review:  Patient is a 69 year old male scheduled for right FPBG on 07/26/14 by Dr. Trula Slade.  History includes smoking, CAD s/p CABG, angina s/p 04/2014 cath (patent LIMA to LAD and SVG to OM3, known occlusion of other SVGs, occluded RCA fills via collaterals), ischemic CM, chronic systolic CHF, angina, PAF, rheumatic fever, syncope post cath 08/2013, CKD stage III, renal cancer '97 s/p left nephrectomy, prostate cancer '12 s/p prostatectomy, PAD s/p angioplasty right EIA 12/26/13 with known right occluded popliteal artery (occluded stents), HTN, HLD, COPD on home night O2, SOB, pulmonary nodules, adjustment disorder with anxiety, posterior cervical laminectomy.  BMI is consistent with obesity.  PCP is listed as Dr. Boykin Nearing. Cardiologist is Dr. Johnsie Cancel who felt "Ok to have surgery having incapacitating pain known moderate risk both from lung and heart perspective." Pulmonologist is Dr. Chase Caller.  Meds include albuterol, Xanax, Eliquis (to hold three days pre-op), Breo Ellipta, Lasix, Dilaudid, Atrovent, Combivent Respimat, Dulera. Prednisone, Protonix, Nitro, Nicoderm.  05/16/14 EKG: NSR, septal infarct (age undetermined).  05/14/14 Echo: - Left ventricle: The cavity size was normal. There was mild focal basal hypertrophy of the septum. Systolic function was normal. The estimated ejection fraction was in the range of 50% to 55%. Wall motion was normal; there were no regional wall motion abnormalities. Doppler parameters are consistent with abnormal left ventricular relaxation (grade 1 diastolic dysfunction). Doppler parameters are consistent with elevated ventricular end-diastolic filling pressure. - Aortic valve: Trileaflet; normal thickness leaflets. Transvalvular velocity was within the normal range. There was no stenosis. There was mild regurgitation. - Aortic root: The aortic root was normal in size. - Mitral valve: Structurally normal valve. There was  no regurgitation. - Right ventricle: Systolic function was normal. - Right atrium: The atrium was normal in size. - Tricuspid valve: There was trivial regurgitation. - Pulmonary arteries: Systolic pressure was within the normal range. - Inferior vena cava: The vessel was normal in size. - Pericardium, extracardiac: There was no pericardial effusion.  04/19/14 Cardiac cath: 1. Moderately diseased left main coronary artery. 2. Severely diseased native mid left anterior descending artery. Patent LIMA to LAD. 3. Severe disease in the native left circumflex artery and its branches. Patent SVG to OM with mild, proximal disease in the graft. 3. Occluded native right coronary artery. Left to right collaterals fill the distal RCA. 4. Mildly decreased left ventricular systolic function. LVEDP 18 mmHg. Ejection fraction 45%. RECOMMENDATION: Continue medical therapy. Cardiac cath was done under general anesthesia today. Given the patient's baseline anxiety, general anesthesia is necessary for this procedure. Followup with Dr. Johnsie Cancel.  04/10/13 PFTs: FVC 2.97 (65%), FEV1 1.74 (52%), DLCO 14.92 (46%). Moderately severe obstructive airways disease with some reversibility.  Preoperative labs noted.   If no acute changes in her cardiopulmonary status then I would anticpate that he could proceed as planned.   George Hugh [facility]/Anesthesiology Phone [phone] 07/24/2014 1:01 PM

## 2014-07-25 ENCOUNTER — Encounter (HOSPITAL_COMMUNITY): Payer: Self-pay | Admitting: Cardiology

## 2014-07-25 MED ORDER — SODIUM CHLORIDE 0.9 % IV SOLN
INTRAVENOUS | Status: DC
  Administered 2014-07-26: 10 mL/h via INTRAVENOUS

## 2014-07-25 MED ORDER — CHLORHEXIDINE GLUCONATE 4 % EX LIQD
60.0000 mL | Freq: Once | CUTANEOUS | Status: DC
  Filled 2014-07-25: qty 60

## 2014-07-25 MED ORDER — DEXTROSE 5 % IV SOLN
1.5000 g | INTRAVENOUS | Status: AC
  Administered 2014-07-26: 1.5 g via INTRAVENOUS
  Filled 2014-07-25: qty 1.5

## 2014-07-25 NOTE — Progress Notes (Signed)
Pt notified of time change;to arrive at 0530.Verbalized understanding.

## 2014-07-26 ENCOUNTER — Encounter (HOSPITAL_COMMUNITY)
Admission: RE | Disposition: A | Discharge status: Home Health | Payer: Self-pay | Source: Ambulatory Visit | Attending: Surgery

## 2014-07-26 ENCOUNTER — Encounter (HOSPITAL_COMMUNITY): Payer: Self-pay | Admitting: *Deleted

## 2014-07-26 ENCOUNTER — Inpatient Hospital Stay (HOSPITAL_COMMUNITY)
Admission: RE | Admit: 2014-07-26 | Discharge: 2014-07-31 | DRG: 252 | Disposition: A | Discharge status: Home Health | Payer: Medicare Other | Source: Ambulatory Visit | Attending: Surgery | Admitting: Surgery

## 2014-07-26 ENCOUNTER — Inpatient Hospital Stay (HOSPITAL_COMMUNITY): Payer: Medicare Other | Admitting: Certified Registered Nurse Anesthetist

## 2014-07-26 ENCOUNTER — Inpatient Hospital Stay (HOSPITAL_COMMUNITY): Payer: Medicare Other | Admitting: Vascular Surgery

## 2014-07-26 DIAGNOSIS — Z7952 Long term (current) use of systemic steroids: Secondary | ICD-10-CM | POA: Diagnosis not present

## 2014-07-26 DIAGNOSIS — F17210 Nicotine dependence, cigarettes, uncomplicated: Secondary | ICD-10-CM | POA: Diagnosis present

## 2014-07-26 DIAGNOSIS — J811 Chronic pulmonary edema: Secondary | ICD-10-CM

## 2014-07-26 DIAGNOSIS — N179 Acute kidney failure, unspecified: Secondary | ICD-10-CM | POA: Diagnosis present

## 2014-07-26 DIAGNOSIS — G8929 Other chronic pain: Secondary | ICD-10-CM | POA: Diagnosis present

## 2014-07-26 DIAGNOSIS — Z85528 Personal history of other malignant neoplasm of kidney: Secondary | ICD-10-CM

## 2014-07-26 DIAGNOSIS — I739 Peripheral vascular disease, unspecified: Secondary | ICD-10-CM | POA: Diagnosis not present

## 2014-07-26 DIAGNOSIS — I129 Hypertensive chronic kidney disease with stage 1 through stage 4 chronic kidney disease, or unspecified chronic kidney disease: Secondary | ICD-10-CM | POA: Diagnosis present

## 2014-07-26 DIAGNOSIS — E119 Type 2 diabetes mellitus without complications: Secondary | ICD-10-CM | POA: Diagnosis present

## 2014-07-26 DIAGNOSIS — I5023 Acute on chronic systolic (congestive) heart failure: Secondary | ICD-10-CM | POA: Diagnosis present

## 2014-07-26 DIAGNOSIS — N183 Chronic kidney disease, stage 3 (moderate): Secondary | ICD-10-CM | POA: Diagnosis present

## 2014-07-26 DIAGNOSIS — Z7982 Long term (current) use of aspirin: Secondary | ICD-10-CM | POA: Diagnosis not present

## 2014-07-26 DIAGNOSIS — Z9981 Dependence on supplemental oxygen: Secondary | ICD-10-CM | POA: Diagnosis not present

## 2014-07-26 DIAGNOSIS — F17200 Nicotine dependence, unspecified, uncomplicated: Secondary | ICD-10-CM | POA: Diagnosis present

## 2014-07-26 DIAGNOSIS — I25810 Atherosclerosis of coronary artery bypass graft(s) without angina pectoris: Secondary | ICD-10-CM | POA: Diagnosis present

## 2014-07-26 DIAGNOSIS — M79604 Pain in right leg: Secondary | ICD-10-CM | POA: Diagnosis present

## 2014-07-26 DIAGNOSIS — Z951 Presence of aortocoronary bypass graft: Secondary | ICD-10-CM

## 2014-07-26 DIAGNOSIS — J449 Chronic obstructive pulmonary disease, unspecified: Secondary | ICD-10-CM | POA: Diagnosis present

## 2014-07-26 DIAGNOSIS — I70211 Atherosclerosis of native arteries of extremities with intermittent claudication, right leg: Principal | ICD-10-CM | POA: Diagnosis present

## 2014-07-26 DIAGNOSIS — G4733 Obstructive sleep apnea (adult) (pediatric): Secondary | ICD-10-CM | POA: Diagnosis present

## 2014-07-26 DIAGNOSIS — I480 Paroxysmal atrial fibrillation: Secondary | ICD-10-CM | POA: Diagnosis present

## 2014-07-26 DIAGNOSIS — I70219 Atherosclerosis of native arteries of extremities with intermittent claudication, unspecified extremity: Secondary | ICD-10-CM | POA: Diagnosis present

## 2014-07-26 DIAGNOSIS — Z7901 Long term (current) use of anticoagulants: Secondary | ICD-10-CM

## 2014-07-26 DIAGNOSIS — R0902 Hypoxemia: Secondary | ICD-10-CM

## 2014-07-26 DIAGNOSIS — N189 Chronic kidney disease, unspecified: Secondary | ICD-10-CM

## 2014-07-26 DIAGNOSIS — I10 Essential (primary) hypertension: Secondary | ICD-10-CM | POA: Diagnosis present

## 2014-07-26 HISTORY — PX: FEMORAL-POPLITEAL BYPASS GRAFT: SHX937

## 2014-07-26 SURGERY — BYPASS GRAFT FEMORAL-POPLITEAL ARTERY
Anesthesia: General | Site: Leg Upper | Laterality: Right

## 2014-07-26 MED ORDER — OXYCODONE-ACETAMINOPHEN 5-325 MG PO TABS: 1.0000 | ORAL_TABLET | ORAL | Status: DC | PRN

## 2014-07-26 MED ORDER — ALBUTEROL SULFATE (2.5 MG/3ML) 0.083% IN NEBU
INHALATION_SOLUTION | RESPIRATORY_TRACT | Status: AC
Start: 2014-07-26 — End: 2014-07-26
  Filled 2014-07-26: qty 3

## 2014-07-26 MED ORDER — PHENYLEPHRINE HCL 10 MG/ML IJ SOLN
INTRAMUSCULAR | Status: DC | PRN
  Administered 2014-07-26 (×6): 80 ug via INTRAVENOUS

## 2014-07-26 MED ORDER — ACETAMINOPHEN 325 MG PO TABS: 325.0000 mg | ORAL_TABLET | ORAL | Status: DC | PRN

## 2014-07-26 MED ORDER — PROTAMINE SULFATE 10 MG/ML IV SOLN
INTRAVENOUS | Status: AC
  Filled 2014-07-26: qty 5

## 2014-07-26 MED ORDER — ALBUTEROL SULFATE (2.5 MG/3ML) 0.083% IN NEBU: 2.5000 mg | INHALATION_SOLUTION | Freq: Four times a day (QID) | RESPIRATORY_TRACT | Status: DC | PRN

## 2014-07-26 MED ORDER — MIDAZOLAM HCL 5 MG/5ML IJ SOLN
INTRAMUSCULAR | Status: DC | PRN
  Administered 2014-07-26 (×2): 2 mg via INTRAVENOUS

## 2014-07-26 MED ORDER — FENTANYL CITRATE 0.05 MG/ML IJ SOLN
25.0000 ug | INTRAMUSCULAR | Status: DC | PRN
  Administered 2014-07-26 (×3): 50 ug via INTRAVENOUS

## 2014-07-26 MED ORDER — GUAIFENESIN ER 600 MG PO TB12
1200.0000 mg | ORAL_TABLET | Freq: Two times a day (BID) | ORAL | Status: DC
Start: 2014-07-26 — End: 2014-07-31
  Administered 2014-07-27 – 2014-07-31 (×3): 1200 mg via ORAL
  Filled 2014-07-26 (×11): qty 2

## 2014-07-26 MED ORDER — NITROGLYCERIN 0.4 MG SL SUBL: 0.4000 mg | SUBLINGUAL_TABLET | SUBLINGUAL | Status: DC | PRN

## 2014-07-26 MED ORDER — ALBUTEROL SULFATE (2.5 MG/3ML) 0.083% IN NEBU
2.5000 mg | INHALATION_SOLUTION | RESPIRATORY_TRACT | Status: DC | PRN
  Administered 2014-07-26 – 2014-07-27 (×2): 2.5 mg via RESPIRATORY_TRACT
  Filled 2014-07-26 (×2): qty 3

## 2014-07-26 MED ORDER — DOPAMINE-DEXTROSE 3.2-5 MG/ML-% IV SOLN: 3.0000 ug/kg/min | INTRAVENOUS | Status: DC

## 2014-07-26 MED ORDER — MAGNESIUM SULFATE 2 GM/50ML IV SOLN
2.0000 g | Freq: Every day | INTRAVENOUS | Status: DC | PRN
  Filled 2014-07-26: qty 50

## 2014-07-26 MED ORDER — ARTIFICIAL TEARS OP OINT
TOPICAL_OINTMENT | OPHTHALMIC | Status: AC
  Filled 2014-07-26: qty 3.5

## 2014-07-26 MED ORDER — ALBUTEROL SULFATE (2.5 MG/3ML) 0.083% IN NEBU
2.5000 mg | INHALATION_SOLUTION | Freq: Once | RESPIRATORY_TRACT | Status: AC
  Administered 2014-07-26: 2.5 mg via RESPIRATORY_TRACT

## 2014-07-26 MED ORDER — METOPROLOL TARTRATE 1 MG/ML IV SOLN: 2.0000 mg | INTRAVENOUS | Status: DC | PRN

## 2014-07-26 MED ORDER — CETYLPYRIDINIUM CHLORIDE 0.05 % MT LIQD
7.0000 mL | Freq: Two times a day (BID) | OROMUCOSAL | Status: DC
  Administered 2014-07-27 – 2014-07-31 (×8): 7 mL via OROMUCOSAL

## 2014-07-26 MED ORDER — FENTANYL CITRATE 0.05 MG/ML IJ SOLN
INTRAMUSCULAR | Status: DC | PRN
  Administered 2014-07-26 (×2): 50 ug via INTRAVENOUS
  Administered 2014-07-26: 100 ug via INTRAVENOUS
  Administered 2014-07-26 (×3): 50 ug via INTRAVENOUS

## 2014-07-26 MED ORDER — HYDROCORTISONE NA SUCCINATE PF 100 MG IJ SOLR
INTRAMUSCULAR | Status: AC
  Filled 2014-07-26: qty 2

## 2014-07-26 MED ORDER — ALBUMIN HUMAN 5 % IV SOLN
INTRAVENOUS | Status: DC | PRN
  Administered 2014-07-26 (×2): via INTRAVENOUS

## 2014-07-26 MED ORDER — CEFUROXIME SODIUM 1.5 G IJ SOLR
1.5000 g | Freq: Two times a day (BID) | INTRAMUSCULAR | Status: AC
  Administered 2014-07-26 – 2014-07-27 (×2): 1.5 g via INTRAVENOUS
  Filled 2014-07-26 (×3): qty 1.5

## 2014-07-26 MED ORDER — IPRATROPIUM-ALBUTEROL 0.5-2.5 (3) MG/3ML IN SOLN
3.0000 mL | Freq: Four times a day (QID) | RESPIRATORY_TRACT | Status: DC
  Administered 2014-07-26 – 2014-07-30 (×17): 3 mL via RESPIRATORY_TRACT
  Filled 2014-07-26 (×18): qty 3

## 2014-07-26 MED ORDER — LACTATED RINGERS IV SOLN
INTRAVENOUS | Status: DC | PRN
  Administered 2014-07-26: 07:00:00 via INTRAVENOUS

## 2014-07-26 MED ORDER — NEOSTIGMINE METHYLSULFATE 10 MG/10ML IV SOLN
INTRAVENOUS | Status: AC
  Filled 2014-07-26: qty 1

## 2014-07-26 MED ORDER — NITROGLYCERIN 0.3 MG SL SUBL
0.3000 mg | SUBLINGUAL_TABLET | SUBLINGUAL | Status: DC | PRN
  Filled 2014-07-26: qty 100

## 2014-07-26 MED ORDER — SUCCINYLCHOLINE CHLORIDE 20 MG/ML IJ SOLN
INTRAMUSCULAR | Status: DC | PRN
  Administered 2014-07-26: 120 mg via INTRAVENOUS

## 2014-07-26 MED ORDER — HEMOSTATIC AGENTS (NO CHARGE) OPTIME
TOPICAL | Status: DC | PRN
  Administered 2014-07-26: 1 via TOPICAL

## 2014-07-26 MED ORDER — NICOTINE 21 MG/24HR TD PT24
21.0000 mg | MEDICATED_PATCH | Freq: Every day | TRANSDERMAL | Status: DC
  Administered 2014-07-26 – 2014-07-31 (×6): 21 mg via TRANSDERMAL
  Filled 2014-07-26 (×7): qty 1

## 2014-07-26 MED ORDER — HYDROMORPHONE HCL 1 MG/ML IJ SOLN
0.5000 mg | INTRAMUSCULAR | Status: DC | PRN
  Administered 2014-07-26 (×2): 0.5 mg via INTRAVENOUS
  Administered 2014-07-26 (×2): 1 mg via INTRAVENOUS
  Administered 2014-07-26: 0.5 mg via INTRAVENOUS
  Administered 2014-07-26 – 2014-07-31 (×29): 1 mg via INTRAVENOUS
  Filled 2014-07-26 (×32): qty 1

## 2014-07-26 MED ORDER — IPRATROPIUM-ALBUTEROL 20-100 MCG/ACT IN AERS: 2.0000 | INHALATION_SPRAY | Freq: Four times a day (QID) | RESPIRATORY_TRACT | Status: DC

## 2014-07-26 MED ORDER — ASPIRIN EC 81 MG PO TBEC
81.0000 mg | DELAYED_RELEASE_TABLET | Freq: Every day | ORAL | Status: DC
  Administered 2014-07-26 – 2014-07-31 (×6): 81 mg via ORAL
  Filled 2014-07-26 (×7): qty 1

## 2014-07-26 MED ORDER — ONDANSETRON HCL 4 MG/2ML IJ SOLN: 4.0000 mg | Freq: Four times a day (QID) | INTRAMUSCULAR | Status: DC | PRN

## 2014-07-26 MED ORDER — ALBUTEROL SULFATE (2.5 MG/3ML) 0.083% IN NEBU: 2.5000 mg | INHALATION_SOLUTION | Freq: Four times a day (QID) | RESPIRATORY_TRACT | Status: DC

## 2014-07-26 MED ORDER — OXYCODONE-ACETAMINOPHEN 5-325 MG PO TABS
ORAL_TABLET | ORAL | Status: AC
  Filled 2014-07-26: qty 1

## 2014-07-26 MED ORDER — HYDRALAZINE HCL 20 MG/ML IJ SOLN: 5.0000 mg | INTRAMUSCULAR | Status: DC | PRN

## 2014-07-26 MED ORDER — PROPOFOL 10 MG/ML IV BOLUS
INTRAVENOUS | Status: DC | PRN
  Administered 2014-07-26: 150 mg via INTRAVENOUS
  Administered 2014-07-26: 30 mg via INTRAVENOUS
  Administered 2014-07-26: 20 mg via INTRAVENOUS
  Administered 2014-07-26: 50 mg via INTRAVENOUS

## 2014-07-26 MED ORDER — SODIUM CHLORIDE 0.9 % IN NEBU
INHALATION_SOLUTION | RESPIRATORY_TRACT | Status: AC
  Filled 2014-07-26: qty 3

## 2014-07-26 MED ORDER — MIDAZOLAM HCL 2 MG/2ML IJ SOLN
INTRAMUSCULAR | Status: AC
  Filled 2014-07-26: qty 2

## 2014-07-26 MED ORDER — ALPRAZOLAM 1 MG PO TABS
1.0000 mg | ORAL_TABLET | Freq: Three times a day (TID) | ORAL | Status: DC | PRN
  Administered 2014-07-26: 1 mg via ORAL

## 2014-07-26 MED ORDER — ONDANSETRON HCL 4 MG/2ML IJ SOLN
INTRAMUSCULAR | Status: AC
  Filled 2014-07-26: qty 2

## 2014-07-26 MED ORDER — HYDROMORPHONE HCL 1 MG/ML IJ SOLN
INTRAMUSCULAR | Status: AC
  Filled 2014-07-26: qty 1

## 2014-07-26 MED ORDER — FENTANYL CITRATE 0.05 MG/ML IJ SOLN
INTRAMUSCULAR | Status: AC
  Filled 2014-07-26: qty 5

## 2014-07-26 MED ORDER — ALPRAZOLAM 0.25 MG PO TABS
ORAL_TABLET | ORAL | Status: AC
  Filled 2014-07-26: qty 4

## 2014-07-26 MED ORDER — ALPRAZOLAM 0.5 MG PO TABS
1.0000 mg | ORAL_TABLET | Freq: Three times a day (TID) | ORAL | Status: DC | PRN
  Administered 2014-07-27 – 2014-07-30 (×6): 1 mg via ORAL
  Filled 2014-07-26 (×6): qty 2

## 2014-07-26 MED ORDER — IPRATROPIUM BROMIDE 0.02 % IN SOLN: 0.5000 mg | Freq: Four times a day (QID) | RESPIRATORY_TRACT | Status: DC

## 2014-07-26 MED ORDER — SODIUM CHLORIDE 0.9 % IR SOLN
Status: DC | PRN
  Administered 2014-07-26: 500 mL

## 2014-07-26 MED ORDER — PHENYLEPHRINE 40 MCG/ML (10ML) SYRINGE FOR IV PUSH (FOR BLOOD PRESSURE SUPPORT)
PREFILLED_SYRINGE | INTRAVENOUS | Status: AC
  Filled 2014-07-26: qty 10

## 2014-07-26 MED ORDER — DOXAZOSIN MESYLATE 8 MG PO TABS
8.0000 mg | ORAL_TABLET | Freq: Every day | ORAL | Status: DC
  Administered 2014-07-26 – 2014-07-30 (×5): 8 mg via ORAL
  Filled 2014-07-26 (×7): qty 1

## 2014-07-26 MED ORDER — ACETAMINOPHEN 650 MG RE SUPP: 325.0000 mg | RECTAL | Status: DC | PRN

## 2014-07-26 MED ORDER — HEPARIN SODIUM (PORCINE) 1000 UNIT/ML IJ SOLN
INTRAMUSCULAR | Status: DC | PRN
  Administered 2014-07-26: 9000 [IU] via INTRAVENOUS

## 2014-07-26 MED ORDER — ONDANSETRON HCL 4 MG/2ML IJ SOLN
INTRAMUSCULAR | Status: DC | PRN
  Administered 2014-07-26: 4 mg via INTRAVENOUS

## 2014-07-26 MED ORDER — FENTANYL CITRATE 0.05 MG/ML IJ SOLN
INTRAMUSCULAR | Status: AC
  Filled 2014-07-26: qty 2

## 2014-07-26 MED ORDER — ALUM & MAG HYDROXIDE-SIMETH 200-200-20 MG/5ML PO SUSP: 15.0000 mL | ORAL | Status: DC | PRN

## 2014-07-26 MED ORDER — POTASSIUM CHLORIDE CRYS ER 20 MEQ PO TBCR: 20.0000 meq | EXTENDED_RELEASE_TABLET | Freq: Every day | ORAL | Status: DC | PRN

## 2014-07-26 MED ORDER — FUROSEMIDE 40 MG PO TABS
40.0000 mg | ORAL_TABLET | Freq: Two times a day (BID) | ORAL | Status: DC
  Administered 2014-07-26 – 2014-07-30 (×9): 40 mg via ORAL
  Filled 2014-07-26 (×13): qty 1

## 2014-07-26 MED ORDER — FENTANYL CITRATE 0.05 MG/ML IJ SOLN
INTRAMUSCULAR | Status: AC
Start: 2014-07-26 — End: 2014-07-26
  Filled 2014-07-26: qty 2

## 2014-07-26 MED ORDER — LIDOCAINE HCL (CARDIAC) 20 MG/ML IV SOLN
INTRAVENOUS | Status: DC | PRN
  Administered 2014-07-26: 100 mg via INTRAVENOUS

## 2014-07-26 MED ORDER — PHENOL 1.4 % MT LIQD: 1.0000 | OROMUCOSAL | Status: DC | PRN

## 2014-07-26 MED ORDER — ROCURONIUM BROMIDE 100 MG/10ML IV SOLN
INTRAVENOUS | Status: DC | PRN
  Administered 2014-07-26 (×2): 10 mg via INTRAVENOUS
  Administered 2014-07-26: 50 mg via INTRAVENOUS

## 2014-07-26 MED ORDER — HYDROCODONE-ACETAMINOPHEN 10-325 MG PO TABS
1.0000 | ORAL_TABLET | Freq: Three times a day (TID) | ORAL | Status: DC
  Administered 2014-07-26 – 2014-07-31 (×15): 1 via ORAL
  Filled 2014-07-26 (×15): qty 1

## 2014-07-26 MED ORDER — HEPARIN SODIUM (PORCINE) 1000 UNIT/ML IJ SOLN
INTRAMUSCULAR | Status: AC
  Filled 2014-07-26: qty 1

## 2014-07-26 MED ORDER — 0.9 % SODIUM CHLORIDE (POUR BTL) OPTIME
TOPICAL | Status: DC | PRN
  Administered 2014-07-26 (×2): 1000 mL

## 2014-07-26 MED ORDER — BISACODYL 10 MG RE SUPP: 10.0000 mg | Freq: Every day | RECTAL | Status: DC | PRN

## 2014-07-26 MED ORDER — SUCCINYLCHOLINE CHLORIDE 20 MG/ML IJ SOLN
INTRAMUSCULAR | Status: AC
  Filled 2014-07-26: qty 1

## 2014-07-26 MED ORDER — GLYCOPYRROLATE 0.2 MG/ML IJ SOLN
INTRAMUSCULAR | Status: DC | PRN
  Administered 2014-07-26: 0.6 mg via INTRAVENOUS

## 2014-07-26 MED ORDER — ROCURONIUM BROMIDE 50 MG/5ML IV SOLN
INTRAVENOUS | Status: AC
  Filled 2014-07-26: qty 2

## 2014-07-26 MED ORDER — PHENYLEPHRINE HCL 10 MG/ML IJ SOLN
INTRAMUSCULAR | Status: AC
  Filled 2014-07-26: qty 1

## 2014-07-26 MED ORDER — NEOSTIGMINE METHYLSULFATE 10 MG/10ML IV SOLN
INTRAVENOUS | Status: DC | PRN
  Administered 2014-07-26: 4 mg via INTRAVENOUS

## 2014-07-26 MED ORDER — GUAIFENESIN-DM 100-10 MG/5ML PO SYRP: 15.0000 mL | ORAL_SOLUTION | ORAL | Status: DC | PRN

## 2014-07-26 MED ORDER — LIDOCAINE HCL (CARDIAC) 20 MG/ML IV SOLN
INTRAVENOUS | Status: AC
  Filled 2014-07-26: qty 5

## 2014-07-26 MED ORDER — PROPOFOL 10 MG/ML IV BOLUS
INTRAVENOUS | Status: AC
  Filled 2014-07-26: qty 20

## 2014-07-26 MED ORDER — MOMETASONE FURO-FORMOTEROL FUM 100-5 MCG/ACT IN AERO
2.0000 | INHALATION_SPRAY | Freq: Two times a day (BID) | RESPIRATORY_TRACT | Status: DC
  Filled 2014-07-26: qty 8.8

## 2014-07-26 MED ORDER — GLYCOPYRROLATE 0.2 MG/ML IJ SOLN
INTRAMUSCULAR | Status: AC
  Filled 2014-07-26: qty 1

## 2014-07-26 MED ORDER — APIXABAN 5 MG PO TABS
5.0000 mg | ORAL_TABLET | Freq: Two times a day (BID) | ORAL | Status: DC
  Administered 2014-07-28 – 2014-07-31 (×7): 5 mg via ORAL
  Filled 2014-07-26 (×8): qty 1

## 2014-07-26 MED ORDER — SODIUM CHLORIDE 0.9 % IV SOLN: 500.0000 mL | Freq: Once | INTRAVENOUS | Status: AC | PRN

## 2014-07-26 MED ORDER — LABETALOL HCL 5 MG/ML IV SOLN
10.0000 mg | INTRAVENOUS | Status: DC | PRN
  Filled 2014-07-26: qty 4

## 2014-07-26 MED ORDER — PAPAVERINE HCL 30 MG/ML IJ SOLN
INTRAMUSCULAR | Status: AC
  Filled 2014-07-26: qty 2

## 2014-07-26 MED ORDER — DOCUSATE SODIUM 100 MG PO CAPS
100.0000 mg | ORAL_CAPSULE | Freq: Every day | ORAL | Status: DC
  Administered 2014-07-29 – 2014-07-30 (×2): 100 mg via ORAL
  Filled 2014-07-26 (×5): qty 1

## 2014-07-26 MED ORDER — PANTOPRAZOLE SODIUM 40 MG PO TBEC
40.0000 mg | DELAYED_RELEASE_TABLET | Freq: Every day | ORAL | Status: DC
  Administered 2014-07-26 – 2014-07-31 (×6): 40 mg via ORAL
  Filled 2014-07-26 (×6): qty 1

## 2014-07-26 MED ORDER — MOMETASONE FURO-FORMOTEROL FUM 100-5 MCG/ACT IN AERO
2.0000 | INHALATION_SPRAY | Freq: Two times a day (BID) | RESPIRATORY_TRACT | Status: DC
Start: 2014-07-26 — End: 2014-07-31
  Administered 2014-07-26 – 2014-07-30 (×9): 2 via RESPIRATORY_TRACT
  Filled 2014-07-26: qty 8.8

## 2014-07-26 MED ORDER — IPRATROPIUM-ALBUTEROL 0.5-2.5 (3) MG/3ML IN SOLN: 3.0000 mL | Freq: Four times a day (QID) | RESPIRATORY_TRACT | Status: DC

## 2014-07-26 MED ORDER — SODIUM CHLORIDE 0.9 % IV SOLN
10.0000 mg | INTRAVENOUS | Status: DC | PRN
  Administered 2014-07-26: 25 ug/min via INTRAVENOUS

## 2014-07-26 MED ORDER — PREDNISONE 10 MG PO TABS
10.0000 mg | ORAL_TABLET | Freq: Every day | ORAL | Status: DC
  Administered 2014-07-26 – 2014-07-31 (×6): 10 mg via ORAL
  Filled 2014-07-26 (×8): qty 1

## 2014-07-26 MED ORDER — IPRATROPIUM-ALBUTEROL 20-100 MCG/ACT IN AERS: 1.0000 | INHALATION_SPRAY | Freq: Four times a day (QID) | RESPIRATORY_TRACT | Status: DC

## 2014-07-26 MED ORDER — PROTAMINE SULFATE 10 MG/ML IV SOLN
INTRAVENOUS | Status: DC | PRN
  Administered 2014-07-26 (×5): 10 mg via INTRAVENOUS

## 2014-07-26 MED ORDER — ARTIFICIAL TEARS OP OINT
TOPICAL_OINTMENT | OPHTHALMIC | Status: DC | PRN
  Administered 2014-07-26: 1 via OPHTHALMIC

## 2014-07-26 MED ORDER — HYDROMORPHONE HCL 1 MG/ML IJ SOLN
0.5000 mg | INTRAMUSCULAR | Status: DC | PRN
  Administered 2014-07-26 (×2): 0.5 mg via INTRAVENOUS

## 2014-07-26 SURGICAL SUPPLY — 62 items
BANDAGE ELASTIC 4 VELCRO ST LF (GAUZE/BANDAGES/DRESSINGS) IMPLANT
BANDAGE ESMARK 6X9 LF (GAUZE/BANDAGES/DRESSINGS) IMPLANT
BNDG CMPR 9X6 STRL LF SNTH (GAUZE/BANDAGES/DRESSINGS)
BNDG ESMARK 6X9 LF (GAUZE/BANDAGES/DRESSINGS)
CANISTER SUCTION 2500CC (MISCELLANEOUS) ×3 IMPLANT
CLIP TI MEDIUM 24 (CLIP) ×3 IMPLANT
CLIP TI WIDE RED SMALL 24 (CLIP) ×3 IMPLANT
CUFF TOURNIQUET SINGLE 24IN (TOURNIQUET CUFF) IMPLANT
CUFF TOURNIQUET SINGLE 34IN LL (TOURNIQUET CUFF) IMPLANT
CUFF TOURNIQUET SINGLE 44IN (TOURNIQUET CUFF) IMPLANT
DRAIN CHANNEL 15F RND FF W/TCR (WOUND CARE) IMPLANT
DRAPE X-RAY CASS 24X20 (DRAPES) IMPLANT
DRSG COVADERM 4X10 (GAUZE/BANDAGES/DRESSINGS) IMPLANT
DRSG COVADERM 4X8 (GAUZE/BANDAGES/DRESSINGS) IMPLANT
ELECT REM PT RETURN 9FT ADLT (ELECTROSURGICAL) ×3
ELECTRODE REM PT RTRN 9FT ADLT (ELECTROSURGICAL) ×1 IMPLANT
EVACUATOR SILICONE 100CC (DRAIN) IMPLANT
GLOVE BIO SURGEON STRL SZ 6.5 (GLOVE) ×2 IMPLANT
GLOVE BIO SURGEONS STRL SZ 6.5 (GLOVE) ×2
GLOVE BIOGEL PI IND STRL 6.5 (GLOVE) IMPLANT
GLOVE BIOGEL PI IND STRL 7.5 (GLOVE) ×1 IMPLANT
GLOVE BIOGEL PI INDICATOR 6.5 (GLOVE) ×8
GLOVE BIOGEL PI INDICATOR 7.5 (GLOVE) ×2
GLOVE SURG SS PI 7.0 STRL IVOR (GLOVE) ×4 IMPLANT
GLOVE SURG SS PI 7.5 STRL IVOR (GLOVE) ×3 IMPLANT
GOWN STRL REUS W/ TWL LRG LVL3 (GOWN DISPOSABLE) ×2 IMPLANT
GOWN STRL REUS W/ TWL XL LVL3 (GOWN DISPOSABLE) ×1 IMPLANT
GOWN STRL REUS W/TWL LRG LVL3 (GOWN DISPOSABLE) ×6
GOWN STRL REUS W/TWL XL LVL3 (GOWN DISPOSABLE) ×9
GRAFT PROPATEN W/RING 6X80X60 (Vascular Products) ×2 IMPLANT
HEMOSTAT SNOW SURGICEL 2X4 (HEMOSTASIS) ×2 IMPLANT
KIT BASIN OR (CUSTOM PROCEDURE TRAY) ×3 IMPLANT
KIT ROOM TURNOVER OR (KITS) ×3 IMPLANT
LIQUID BAND (GAUZE/BANDAGES/DRESSINGS) ×3 IMPLANT
MARKER GRAFT CORONARY BYPASS (MISCELLANEOUS) IMPLANT
NS IRRIG 1000ML POUR BTL (IV SOLUTION) ×6 IMPLANT
PACK PERIPHERAL VASCULAR (CUSTOM PROCEDURE TRAY) ×3 IMPLANT
PAD ARMBOARD 7.5X6 YLW CONV (MISCELLANEOUS) ×6 IMPLANT
PADDING CAST COTTON 6X4 STRL (CAST SUPPLIES) IMPLANT
PROBE PENCIL 8 MHZ STRL DISP (MISCELLANEOUS) ×2 IMPLANT
SEALANT SURG COSEAL 4ML (VASCULAR PRODUCTS) ×2 IMPLANT
SET COLLECT BLD 21X3/4 12 (NEEDLE) IMPLANT
SPONGE INTESTINAL PEANUT (DISPOSABLE) ×2 IMPLANT
STOPCOCK 4 WAY LG BORE MALE ST (IV SETS) IMPLANT
SUT ETHILON 3 0 PS 1 (SUTURE) IMPLANT
SUT GORETEX 6.0 TT13 (SUTURE) ×4 IMPLANT
SUT GORETEX 6.0 TT9 (SUTURE) ×2 IMPLANT
SUT PROLENE 5 0 C 1 24 (SUTURE) ×9 IMPLANT
SUT PROLENE 6 0 BV (SUTURE) ×5 IMPLANT
SUT PROLENE 7 0 BV 1 (SUTURE) IMPLANT
SUT SILK 2 0 SH (SUTURE) ×5 IMPLANT
SUT SILK 3 0 (SUTURE)
SUT SILK 3-0 18XBRD TIE 12 (SUTURE) IMPLANT
SUT VIC AB 2-0 CT1 27 (SUTURE) ×6
SUT VIC AB 2-0 CT1 TAPERPNT 27 (SUTURE) ×2 IMPLANT
SUT VIC AB 3-0 SH 27 (SUTURE) ×6
SUT VIC AB 3-0 SH 27X BRD (SUTURE) ×2 IMPLANT
SUT VICRYL 4-0 PS2 18IN ABS (SUTURE) ×6 IMPLANT
TRAY FOLEY CATH 16FRSI W/METER (SET/KITS/TRAYS/PACK) ×3 IMPLANT
TUBING EXTENTION W/L.L. (IV SETS) IMPLANT
UNDERPAD 30X30 INCONTINENT (UNDERPADS AND DIAPERS) ×3 IMPLANT
WATER STERILE IRR 1000ML POUR (IV SOLUTION) ×3 IMPLANT

## 2014-07-26 NOTE — Anesthesia Procedure Notes (Signed)
Procedure Name: Intubation Date/Time: 07/26/2014 7:50 AM Performed by: Scheryl Darter Pre-anesthesia Checklist: Patient identified, Emergency Drugs available, Suction available, Patient being monitored and Timeout performed Patient Re-evaluated:Patient Re-evaluated prior to inductionOxygen Delivery Method: Circle system utilized Preoxygenation: Pre-oxygenation with 100% oxygen Intubation Type: IV induction Ventilation: Mask ventilation without difficulty Laryngoscope Size: Miller and 3 Grade View: Grade I Tube type: Oral Tube size: 7.5 mm Number of attempts: 1 Airway Equipment and Method: Stylet Placement Confirmation: ETT inserted through vocal cords under direct vision,  positive ETCO2 and breath sounds checked- equal and bilateral Secured at: 23 cm Tube secured with: Tape Dental Injury: Teeth and Oropharynx as per pre-operative assessment  Comments: Dentures out in OR , sent to PACU with other belongings

## 2014-07-26 NOTE — Transfer of Care (Signed)
Immediate Anesthesia Transfer of Care Note  Patient: Lucas [name]  Procedure(s) Performed: Procedure(s): BYPASS GRAFT FEMORAL-POPLITEAL ARTERY- right leg with PTFE (Right)  Patient Location: PACU  Anesthesia Type:General  Level of Consciousness: awake, alert , oriented and sedated  Airway & Oxygen Therapy: Patient Spontanous Breathing and Patient connected to face mask oxygen  Post-op Assessment: Report given to PACU RN, Post -op Vital signs reviewed and stable and Patient moving all extremities  Post vital signs: Reviewed and stable  Complications: No apparent anesthesia complications

## 2014-07-26 NOTE — Op Note (Signed)
Patient name: Lucas [name] MRN: [id number] DOB: [birth year] Sex: male  07/26/2014 Pre-operative Diagnosis: Right leg claudication, bordering on rest pain Post-operative diagnosis:  Same Surgeon:  Eldridge Abrahams Assistants:  Silva Bandy Procedure:   Right common femoral to below knee popliteal artery bypass graft with 6 mm external ring propatent Anesthesia:  Gen. Blood Loss:  See anesthesia record Specimens:  None  Findings:  Proximal anastomosis was to the distal common femoral artery.  The distal anastomosis was to the below knee popliteal artery.  The patient had a posterior tibial Doppler signal.  Indications:  The patient suffers from right leg claudication which is severe.  He recently underwent an attempted percutaneous revascularization, however this occluded.  He cannot tolerate his level of disability and wishes to proceed with bypass.  His vein has been utilized for prior heart surgery.  Procedure:  The patient was identified in the holding area and taken to [place] [facility]  The patient was then placed supine on the table. general anesthesia was administered.  The patient was prepped and draped in the usual sterile fashion.  A time out was called and antibiotics were administered.  An oblique incision was made in the right groin.  Cautery was used to divide subcutaneous tissue down to the femoral sheath which was opened sharply.  There was a mild amount of scar tissue in the right groin.  The common femoral artery was dissected out from the anal ligament down to the bifurcation.  There was a posterior plaque.  Next a medial below the knee incision was made.  Fascia was divided with cautery and the popliteal space was entered.  The popliteal artery was then exposed circumferentially.  It was a healthy-appearing artery.  Next a subsartorial tunnel was created between the 2 incisions.  The patient was fully heparinized.  After heparin circulated, common femoral artery was occluded.   A #11 blade was used to make an arteriotomy which was extended longitudinally with Potts scissors.  A 6 mm external ring propatent graft was then utilized for a end-to-side anastomosis with CV 6 Gore-Tex suture.  I did utilize CoSeal to help with hemostasis.  The graft was then brought through the previously created tunnel making sure to maintain proper orientation.  A tourniquet was placed on the upper thigh.  The leg was exsanguinated with an Esmarch and the tourniquet was taken to 250 mm of pressure.  An 11 blade was used to make an arteriotomy in the below knee popliteal artery which was extended with Potts scissors in a longitudinal direction.  The leg was extended and the graft cut to the appropriate length and then beveled to fit the size of the arteriotomy.  A running anastomosis was created with CV 6 Gore-Tex suture.  Prior to completion, the tourniquet was let down deeper flushing maneuvers were performed and then bloodflow was flushed through the graft.  The anastomosis was completed.  The patient had a graft-dependent Doppler signal in the posterior tibial artery.  50 mg of protamine was utilized.  Once hemostasis was satisfactory, the groin incision was closed by reapproximating the femoral sheath with 2-0 Vicryl.  Additional layers of 20 and 3-0 Vicryl were used followed by 40 on the skin.  In the below knee incision, the fascia was reapproximated with 2-0 Vicryl.  The subcutaneous tissue was closed with 2-0 Vicryl and the skin with 4-0 Vicryl.  There were no immediate complications.  Dermabond was applied.   Disposition:  To PACU in stable condition.   Theotis Burrow, M.D. [facility] Office: [phone] Pager:  [phone]

## 2014-07-26 NOTE — Interval H&P Note (Signed)
History and Physical Interval Note:  07/26/2014 7:27 AM  Lucas [name]  has presented today for surgery, with the diagnosis of Peripheral Arterial Disease with right leg claudication I70.211  The various methods of treatment have been discussed with the patient and family. After consideration of risks, benefits and other options for treatment, the patient has consented to  Procedure(s): BYPASS GRAFT FEMORAL-POPLITEAL ARTERY- right leg with PTFE (Right) as a surgical intervention .  The patient's history has been reviewed, patient examined, no change in status, stable for surgery.  I have reviewed the patient's chart and labs.  Questions were answered to the patient's satisfaction.     BRABHAM IV, V. WELLS

## 2014-07-26 NOTE — Consult Note (Signed)
PULMONARY / CRITICAL CARE MEDICINE   Name: Lucas [name] MRN: [id number] DOB: 1946/02/03    ADMISSION DATE:  07/26/2014 CONSULTATION DATE:  1/15  REFERRING MD :  [facility]  CHIEF COMPLAINT:  Wheeze  INITIAL PRESENTATION: For rt fem-pop  STUDIES:    SIGNIFICANT EVENTS: 1/15 rt fem-pop   HISTORY OF PRESENT ILLNESS:   Lucas [name] is a 69 yo vasculopath who continues to smoke and presents to [facility] today for rt fem-pop per Dr. Trula Slade. He was noted to have wheezes post op(but he wheezes all the time per him) NAD at rest. His biggest complaint is rt leg pain. He has an extensive pmh as listed below. He ia a pulmonary patient od Dr. Chase Caller.  PAST MEDICAL HISTORY :   has a past medical history of CAD (coronary artery disease); Hypertension; Adjustment disorder with anxiety; Hyperlipidemia; BPH (benign prostatic hypertrophy); COPD (chronic obstructive pulmonary disease); Arthritis; Ischemic cardiomyopathy; Chronic systolic CHF (congestive heart failure); Prostate cancer (dx'd 2012); Renal cancer (dx'd 1997); CKD (chronic kidney disease), stage III; Tobacco abuse; Syncope; PAF (paroxysmal atrial fibrillation); Hypotension; Chronic pain; PAD (peripheral artery disease); Pulmonary nodules; Anxiety (Dx 2004); Anginal pain; Shortness of breath dyspnea; and Rheumatic fever.  has past surgical history that includes Coronary artery bypass graft; Cardiac catheterization; Nephrectomy; Posterior cervical laminectomy; heart stents; Robot assisted laparoscopic radical prostatectomy; Cystoscopy with litholapaxy (05/01/2012); prostate cancer; Kidney surgery (Left, 1994); left and right heart catheterization with coronary/graft angiogram (N/A, 04/24/2012); left and right heart catheterization with coronary angiogram (N/A, 08/21/2013); lower extremity angiogram (N/A, 08/21/2013); abdominal aortagram (N/A, 12/25/2013); left heart catheterization with coronary/graft angiogram (N/A, 04/19/2014); and abdominal aortagram (N/A,  06/26/2014). Prior to Admission medications   Medication Sig Start Date End Date Taking? Authorizing Provider  albuterol (PROVENTIL HFA;VENTOLIN HFA) 108 (90 BASE) MCG/ACT inhaler Inhale 2 puffs into the lungs every 6 (six) hours as needed for wheezing or shortness of breath. Shortness of breath 02/27/14  Yes Brand Males, MD  albuterol (PROVENTIL) (2.5 MG/3ML) 0.083% nebulizer solution Take 3 mLs (2.5 mg total) by nebulization 4 (four) times daily. Dx J44.1 05/31/14  Yes Brand Males, MD  ALPRAZolam Duanne Moron) 1 MG tablet Take 1 tablet (1 mg total) by mouth 2 (two) times daily as needed for anxiety. Patient taking differently: Take 1 mg by mouth 3 (three) times daily as needed for anxiety.  05/28/14  Yes Josalyn C Funches, MD  apixaban (ELIQUIS) 5 MG TABS tablet Take 1 tablet (5 mg total) by mouth 2 (two) times daily. 05/08/14  Yes Serafina Mitchell, MD  aspirin EC 81 MG tablet Take 81 mg by mouth daily.   Yes Historical Provider, MD  doxazosin (CARDURA) 8 MG tablet Take 1 tablet (8 mg total) by mouth at bedtime. 06/10/14  Yes Brand Males, MD  furosemide (LASIX) 20 MG tablet Take 2 tablets (40 mg total) by mouth 2 (two) times daily. 06/27/14  Yes Brand Males, MD  HYDROcodone-acetaminophen (NORCO) 10-325 MG per tablet Take 1 tablet by mouth 3 (three) times daily. Patient taking differently: Take 1 tablet by mouth 5 (five) times daily.  06/27/14  Yes Samantha J Rhyne, PA-C  HYDROmorphone (DILAUDID) 4 MG tablet Take 4 mg by mouth every 4 (four) hours as needed for moderate pain.  05/18/14  Yes Historical Provider, MD  Ipratropium-Albuterol (COMBIVENT RESPIMAT) 20-100 MCG/ACT AERS respimat Inhale 1 puff into the lungs 4 (four) times daily. 04/02/14  Yes Brand Males, MD  mometasone-formoterol (DULERA) 100-5 MCG/ACT AERO Inhale 2 puffs into the lungs 2 (  two) times daily. 06/10/14  Yes Brand Males, MD  nicotine (NICODERM CQ - DOSED IN MG/24 HOURS) 21 mg/24hr patch Place 21 mg onto the  skin daily.   Yes Historical Provider, MD  predniSONE (DELTASONE) 10 MG tablet Take 1 tablet (10 mg total) by mouth daily. 07/09/14  Yes Brand Males, MD  Fluticasone Furoate-Vilanterol (BREO ELLIPTA) 100-25 MCG/INH AEPB Inhale 1 puff into the lungs every morning. Patient not taking: Reported on 07/22/2014 06/18/14   Brand Males, MD  guaiFENesin (MUCINEX) 600 MG 12 hr tablet Take 2 tablets (1,200 mg total) by mouth 2 (two) times daily. Patient taking differently: Take 1,200 mg by mouth 2 (two) times daily as needed for cough.  05/17/14   Nishant Dhungel, MD  ipratropium (ATROVENT) 0.02 % nebulizer solution Take 0.5 mg by nebulization 4 (four) times daily.    Historical Provider, MD  nitroGLYCERIN (NITROSTAT) 0.3 MG SL tablet Place 0.3 mg under the tongue every 5 (five) minutes as needed for chest pain.    Historical Provider, MD  pantoprazole (PROTONIX) 40 MG tablet Take 1 tablet (40 mg total) by mouth daily at 12 noon. Patient not taking: Reported on 07/22/2014 04/20/14   Barton Dubois, MD   Allergies  Allergen Reactions  . Ativan [Lorazepam] Other (See Comments)    Increases agitation, tolerates xanax  . Lisinopril Cough  . Morphine And Related Other (See Comments)    Hallucinations, too sedated when given with ativan  . Ibuprofen Hives, Nausea And Vomiting and Rash    Tolerates baby aspirin per patient.  There is no allergy to any dose of aspirin. Pt refuses to take 325mg  because it caused nose bleeds.    FAMILY HISTORY:  indicated that his mother is deceased. He indicated that his father is deceased. He indicated that his sister is alive. He indicated that his brother is deceased.  SOCIAL HISTORY:  reports that he has been smoking Cigarettes.  He has a 16.2 pack-year smoking history. He quit smokeless tobacco use about 2 years ago. He reports that he drinks alcohol. He reports that he does not use illicit drugs.  REVIEW OF SYSTEMS:  10 point review of system taken, please see HPI  for positives and negatives.   SUBJECTIVE:   VITAL SIGNS: Temp:  [98.4 F (36.9 C)-98.9 F (37.2 C)] 98.4 F (36.9 C) (01/15 1048) Pulse Rate:  [69-93] 69 (01/15 1302) Resp:  [10-29] 23 (01/15 1302) BP: (143-187)/(68-77) 143/77 mmHg (01/15 1048) SpO2:  [87 %-100 %] 93 % (01/15 1302) Weight:  [229 lb (103.874 kg)] 229 lb (103.874 kg) (01/15 0552) HEMODYNAMICS:   VENTILATOR SETTINGS:   INTAKE / OUTPUT:  Intake/Output Summary (Last 24 hours) at 07/26/14 1312 Last data filed at 07/26/14 1025  Gross per 24 hour  Intake   1700 ml  Output    245 ml  Net   1455 ml    PHYSICAL EXAMINATION: General:  EWM  NAD at rest Neuro:  Intact , follows commands HEENT:  Short neck, No JVD/LAN Cardiovascular:  HSD RRR Lungs:  Coarse rhonchi bilateral , + wheeze but he always wheezes Abdomen:  Soft + BS Musculoskeletal:  Rt leg femoral and popliteal surgery sites CDI. Rt foot warm Skin: dry  LABS:  CBC  Recent Labs Lab 07/23/14 0939  WBC 9.4  HGB 13.6  HCT 42.9  PLT 144*   Coag's  Recent Labs Lab 07/23/14 0939  APTT 29  INR 1.10   BMET  Recent Labs Lab 07/23/14 0939  NA  140  K 3.8  CL 105  CO2 25  BUN 24*  CREATININE 1.33  GLUCOSE 109*   Electrolytes  Recent Labs Lab 07/23/14 0939  CALCIUM 9.7   Sepsis Markers No results for input(s): LATICACIDVEN, PROCALCITON, O2SATVEN in the last 168 hours. ABG No results for input(s): PHART, PCO2ART, PO2ART in the last 168 hours. Liver Enzymes  Recent Labs Lab 07/23/14 0939  AST 26  ALT 22  ALKPHOS 55  BILITOT 0.6  ALBUMIN 3.9   Cardiac Enzymes No results for input(s): TROPONINI, PROBNP in the last 168 hours. Glucose No results for input(s): GLUCAP in the last 168 hours.  Imaging No results found.   ASSESSMENT     SMOKER   Essential hypertension   COPD, moderate   Chronic pain   History of renal cell cancer   CAD (coronary artery disease) of artery bypass graft   Acute-on-chronic kidney  injury Lab Results  Component Value Date   CREATININE 1.33 07/23/2014   CREATININE 1.31 06/27/2014   CREATININE 1.30 06/26/2014   CREATININE 1.19 05/03/2014   CREATININE 1.36* 04/26/2014   Atherosclerosis of native arteries of extremity with intermittent claudication  Rt femoral popliteal graft 1/15     PLAN: -BD as scheduled O2 as needed Stop smoking Follow labs Surgical concerns per [facility]VS Continue antihypertensives Pain control per [facility]  TODAY'S SUMMARY:  69 yo WM with COPD, O2 dependent, continues to smoke, PVD who had rt common femoral to rt popliteal artery today. Reported to have wheezing post op in PACU but when PCCM arrived he was in NAD except for rt leg pain. PCCM consulted for COPD. PCCM will follow daily.  Richardson Landry Minor ACNP Maryanna Shape PCCM Pager [phone] till 3 pm If no answer page [phone] 07/26/2014, 1:18 PM  Ruthe Mannan and combivent at home, will resume while int he hospital.  PRN albuterol.  O2 for sat of 88-92%.  Smoking cessation.  Pain management.  IS and flutter valve for airway clearance.  PCCM will follow.  Patient seen and examined, agree with above note.  I dictated the care and orders written for this patient under my direction.  Rush Farmer, MD [phone]

## 2014-07-26 NOTE — Anesthesia Postprocedure Evaluation (Signed)
  Anesthesia Post-op Note  Patient: Lucas [name]  Procedure(s) Performed: Procedure(s): BYPASS GRAFT FEMORAL-POPLITEAL ARTERY- right leg with PTFE (Right)  Patient Location: PACU  Anesthesia Type:General  Level of Consciousness: awake  Airway and Oxygen Therapy: Patient Spontanous Breathing  Post-op Pain: mild  Post-op Assessment: Post-op Vital signs reviewed  Post-op Vital Signs: Reviewed  Last Vitals:  Filed Vitals:   07/26/14 1600  BP:   Pulse: 80  Temp: 37.1 C  Resp: 18    Complications: No apparent anesthesia complications

## 2014-07-26 NOTE — Progress Notes (Signed)
All first assessement done per Wyona Almas RN

## 2014-07-26 NOTE — H&P (View-Only) (Signed)
Patient name: Lucas [name] MRN: [id number] DOB: [birth year] Sex: male     Chief Complaint  Patient presents with  . Re-evaluation    discuss Rt LE BP    HISTORY OF PRESENT ILLNESS: Patient is back today for continued surveillance and follow-up of his right leg claudication. He is status post angioplasty of a right external iliac artery stenosis on 12/26/2013. This helped his symptoms somewhat, however he still suffers from lifestyle limiting claudication which affects his ability to walk for more than approximately 1 minute. He also is now complaining of pain in his left thigh which occurs mostly when trying to stand up. He has a long-standing history of tobacco abuse but has quit. He still complains of cough and shortness of breath which he attributes to his COPD. He has a history of heart failure and atrial fibrillation, for which he is on chronic anticoagulation. He has gone coronary artery bypass grafting in the past. He is asymptomatic. He denies having any ulcers or rest pain. He takes a statin for hypercholesterolemia. He did not take cilostazol given his heart failure. He did not have any significant benefit   The patient recently underwent subintimal recanalization and stenting of an occluded right popliteal artery.  He began having discomfort in his right thigh following stent deployment.  This was attributed to the stents, however he came back to the office 2 weeks later an ultrasound identified that his stents were occluded.  He continues to have symptoms that he cannot tolerate.  Past Medical History  Diagnosis Date  . CAD (coronary artery disease)     a. s/p CABG x 5;  b. 04/2012 Cath: patent LIMA->LAD and VG->OM3, all other grafts occluded. c. Cath 12/2013: patent SVG-OM3 & LIMA-LAD, known occ other VG.  Marland Kitchen Hypertension   . Adjustment disorder with anxiety   . Hyperlipidemia     a. Unable to take statins.  Marland Kitchen BPH (benign prostatic hypertrophy)   . COPD (chronic  obstructive pulmonary disease)     a. on home O2.  . Arthritis   . Ischemic cardiomyopathy     a. 06/2013 Echo: EF 30-35%, no reg wma's, Gr2 DD, triv AI, mod dil LA, nl RV fxn.  . Chronic systolic CHF (congestive heart failure)     a. 06/2013 Echo: EF 30-35%. b. 11/08/13 EF 40-45%, LVH, HK of the mid-distalanterseptal myocardium, trivial Ao regurg, calcified mitral annulus, RA mildly dilated  . Prostate cancer dx'd 2012  . Renal cancer dx'd 1997    lt nephrectomy  . CKD (chronic kidney disease), stage III   . Tobacco abuse     a. 29 yr hx as of 2015, transitioned to e-cig.  Marland Kitchen Syncope     a. 08/2013: the day following cath, no injury, had received multiple doses of Versed for anxiety - this was felt to be a contributing factor.  Marland Kitchen PAF (paroxysmal atrial fibrillation)     a. on Eliquis 5 mg bid  . Hypotension     a. H/o soft BP prohibiting ACEI/ARB use.  . Chronic pain   . PAD (peripheral artery disease)     a. 12/2013: PV angio s/p angioplasty to R external iliac artery stenosis, also has R SFA occlusion with reconstitution of above-knee popliteal artery, 2 vessel runoff via peroneal and posterior tib.b. LE doppler 02/2014 ABI right 0.52, left 0.95  . Pulmonary nodules     a. 12/2013:  small pulmonary nodules measuring up to 13mm in RM/LL, f/u  recommended 6-12 months.  . Anxiety Dx 2004    Past Surgical History  Procedure Laterality Date  . Coronary artery bypass graft      x 5  . Cardiac catheterization    . Nephrectomy      left nephrectomy for ca  . Posterior cervical laminectomy      x 8   limited ROM  and can't lie flat  . Heart stents      x 5  . Robot assisted laparoscopic radical prostatectomy      for prostate cancer  . Cystoscopy with litholapaxy  05/01/2012    Procedure: CYSTOSCOPY WITH LITHOLAPAXY;  Surgeon: Dutch Gray, MD;  Location: [facility];  Service: Urology;  Laterality: N/A;  . Prostate cancer    . Kidney surgery Left 1994  . Left and right heart catheterization  with coronary/graft angiogram N/A 04/24/2012    Procedure: LEFT AND RIGHT HEART CATHETERIZATION WITH Beatrix Fetters;  Surgeon: Sherren Mocha, MD;  Location: [facility] CATH LAB;  Service: Cardiovascular;  Laterality: N/A;  . Left and right heart catheterization with coronary angiogram N/A 08/21/2013    Procedure: LEFT AND RIGHT HEART CATHETERIZATION WITH CORONARY ANGIOGRAM;  Surgeon: Josue Hector, MD;  Location: [facility] CATH LAB;  Service: Cardiovascular;  Laterality: N/A;  . Lower extremity angiogram N/A 08/21/2013    Procedure: LOWER EXTREMITY ANGIOGRAM;  Surgeon: Josue Hector, MD;  Location: [facility] CATH LAB;  Service: Cardiovascular;  Laterality: N/A;  . Abdominal aortagram N/A 12/25/2013    Procedure: ABDOMINAL AORTAGRAM;  Surgeon: Serafina Mitchell, MD;  Location: [facility] CATH LAB;  Service: Cardiovascular;  Laterality: N/A;  . Left heart catheterization with coronary/graft angiogram N/A 04/19/2014    Procedure: LEFT HEART CATHETERIZATION WITH Beatrix Fetters;  Surgeon: Jettie Booze, MD;  Location: [facility] CATH LAB;  Service: Cardiovascular;  Laterality: N/A;  . Abdominal aortagram N/A 06/26/2014    Procedure: ABDOMINAL AORTAGRAM;  Surgeon: Serafina Mitchell, MD;  Location: [facility] CATH LAB;  Service: Cardiovascular;  Laterality: N/A;    History   Social History  . Marital Status: Widowed    Spouse Name: N/A    Number of Children: N/A  . Years of Education: N/A   Occupational History  . disabled    Social History Main Topics  . Smoking status: Former Smoker -- 0.30 packs/day for 54 years    Types: Cigarettes    Quit date: 02/04/2014  . Smokeless tobacco: Former Systems developer    Quit date: 04/19/2012     Comment: Using e-cig now  . Alcohol Use: No  . Drug Use: No  . Sexual Activity: Not Currently   Other Topics Concern  . Not on file   Social History Narrative    He lives in [place] by himself.  He is a widower.    He continues to smoke about 4 or 5  cigarettes a day but has a greater  than 100 pack-year history having  previously smoked up to 3-4 packs a day over the span about 48 years.     He denies alcohol or drug use.  He is retired secondary to disability     since the 24s.     Family History  Problem Relation Age of Onset  . Diabetes Mother   . Heart disease Mother   . Hypertension Mother   . Heart attack Mother   . Coronary artery disease    . Heart disease Father   . Diabetes Father   . Heart attack Father   .  Heart disease Brother   . Heart attack Brother   . COPD Sister     Allergies as of 07/15/2014 - Review Complete 07/15/2014  Allergen Reaction Noted  . Ativan [lorazepam] Other (See Comments) 04/21/2012  . Lisinopril Cough 08/22/2013  . Morphine and related Other (See Comments) 04/18/2012  . Ibuprofen Hives, Nausea And Vomiting, and Rash 04/17/2012    Current Outpatient Prescriptions on File Prior to Visit  Medication Sig Dispense Refill  . albuterol (PROVENTIL HFA;VENTOLIN HFA) 108 (90 BASE) MCG/ACT inhaler Inhale 2 puffs into the lungs every 6 (six) hours as needed for wheezing or shortness of breath. Shortness of breath 1 Inhaler 5  . albuterol (PROVENTIL) (2.5 MG/3ML) 0.083% nebulizer solution Take 3 mLs (2.5 mg total) by nebulization 4 (four) times daily. Dx J44.1 360 mL 5  . ALPRAZolam (XANAX) 1 MG tablet Take 1 tablet (1 mg total) by mouth 2 (two) times daily as needed for anxiety. (Patient taking differently: Take 1 mg by mouth 3 (three) times daily as needed for anxiety. ) 60 tablet 2  . apixaban (ELIQUIS) 5 MG TABS tablet Take 1 tablet (5 mg total) by mouth 2 (two) times daily. 60 tablet 1  . aspirin EC 81 MG tablet Take 81 mg by mouth daily.    Marland Kitchen doxazosin (CARDURA) 8 MG tablet Take 1 tablet (8 mg total) by mouth at bedtime. 60 tablet 0  . Fluticasone Furoate-Vilanterol (BREO ELLIPTA) 100-25 MCG/INH AEPB Inhale 1 puff into the lungs every morning. 1 each 6  . furosemide (LASIX) 20 MG tablet Take 2 tablets (40 mg total) by mouth 2 (two)  times daily. 120 tablet 1  . guaiFENesin (MUCINEX) 600 MG 12 hr tablet Take 2 tablets (1,200 mg total) by mouth 2 (two) times daily. 10 tablet 0  . HYDROcodone-acetaminophen (NORCO) 10-325 MG per tablet Take 1 tablet by mouth 3 (three) times daily. 15 tablet 0  . HYDROmorphone (DILAUDID) 4 MG tablet Take 4 mg by mouth 3 (three) times daily as needed for moderate pain.     Marland Kitchen ipratropium (ATROVENT) 0.02 % nebulizer solution Take 0.5 mg by nebulization 4 (four) times daily.    . Ipratropium-Albuterol (COMBIVENT RESPIMAT) 20-100 MCG/ACT AERS respimat Inhale 1 puff into the lungs 4 (four) times daily. 1 Inhaler 5  . mometasone-formoterol (DULERA) 100-5 MCG/ACT AERO Inhale 2 puffs into the lungs 2 (two) times daily. 1 Inhaler 5  . nitroGLYCERIN (NITROSTAT) 0.3 MG SL tablet Place 0.3 mg under the tongue every 5 (five) minutes as needed for chest pain.    . pantoprazole (PROTONIX) 40 MG tablet Take 1 tablet (40 mg total) by mouth daily at 12 noon. 30 tablet 1  . predniSONE (DELTASONE) 10 MG tablet Take 1 tablet (10 mg total) by mouth daily. 30 tablet 5  . predniSONE (DELTASONE) 20 MG tablet     . predniSONE (STERAPRED UNI-PAK) 10 MG tablet Take 1-6 tablets by mouth daily. For 6 days. Ends 06/27/2015     No current facility-administered medications on file prior to visit.     REVIEW OF SYSTEMS: Cardiovascular: No chest pain, chest pressure, palpitations, orthopnea, or dyspnea on exertion.  Right leg pain with minimal activity Pulmonary: Positive productive cough,  Neurologic: No weakness, paresthesias, aphasia, or amaurosis. No dizziness. Hematologic: No bleeding problems or clotting disorders. Musculoskeletal: No joint pain or joint swelling. Gastrointestinal: No blood in stool or hematemesis Genitourinary: No dysuria or hematuria. Psychiatric:: No history of major depression. Integumentary: No rashes or ulcers. Constitutional: No fever  or chills.  PHYSICAL EXAMINATION:   Vital signs are BP  157/82 mmHg  Pulse 81  Ht 5' 8.5" (1.74 m)  Wt 227 lb (102.967 kg)  BMI 34.01 kg/m2  SpO2 95% General: The patient appears their stated age. HEENT:  No gross abnormalities Pulmonary:  Non labored breathing Musculoskeletal: There are no major deformities. Neurologic: No focal weakness or paresthesias are detected, Skin: There are no ulcer or rashes noted. Psychiatric: The patient has normal affect. Cardiovascular: There is a regular rate and rhythm without significant murmur appreciated.   Diagnostic Studies None  Assessment: Severe right leg claudication Plan: I discussed our treatment options with the patient which include continued medical therapy versus surgical revascularization.  The patient states that he cannot tolerate the level of discomfort in his right leg which happens with minimal activity.  His vein has been utilized for CABG.  Therefore, he will need a right femoral to below knee popliteal artery bypass graft using PTFE.  We discussed the long-term patency of the grafts, particularly with his comorbidities.  He wishes to proceed.  This is been scheduled for Friday, January 15.  He will need to be off of anticoagulation prior to his operation.  I will confirm cardiac clearance with Dr. Mila Palmer. Leia Alf, M.D. [facility] [place] Office: [phone] Pager:  [phone]

## 2014-07-26 NOTE — Progress Notes (Signed)
  Day of Surgery Note    Subjective:  No complaints  Filed Vitals:   07/26/14 1302  BP:   Pulse: 69  Temp:   Resp: 23    Incisions:   Small hematoma (soft) at right below knee incision, otherwise, both incisions are c/d/i Extremities:  Biphasic PT doppler signal right PT Lungs:  Non labored   Assessment/Plan:  This is a 69 y.o. male who is s/p Right common femoral to below knee popliteal artery bypass graft with 6 mm external ring propatent  -pt doing well this afternoon with biphasic right PT signal -pt to [place] when bed available -anticipate discharge home in 2-3 days   Leontine Locket, PA-C 07/26/2014 1:45 PM

## 2014-07-26 NOTE — Anesthesia Preprocedure Evaluation (Signed)
Anesthesia Evaluation  Patient identified by MRN, date of birth, ID band Patient awake    Airway Mallampati: II  TM Distance: >3 FB Neck ROM: Full    Dental   Pulmonary shortness of breath, sleep apnea , COPDCurrent Smoker,          Cardiovascular hypertension, + angina + CAD, + Peripheral Vascular Disease and +CHF     Neuro/Psych    GI/Hepatic negative GI ROS, Neg liver ROS,   Endo/Other    Renal/GU Renal disease     Musculoskeletal   Abdominal   Peds  Hematology   Anesthesia Other Findings   Reproductive/Obstetrics                             Anesthesia Physical Anesthesia Plan  ASA: IV  Anesthesia Plan: General   Post-op Pain Management:    Induction: Intravenous  Airway Management Planned: Oral ETT  Additional Equipment:   Intra-op Plan:   Post-operative Plan: Possible Post-op intubation/ventilation  Informed Consent: I have reviewed the patients History and Physical, chart, labs and discussed the procedure including the risks, benefits and alternatives for the proposed anesthesia with the patient or authorized representative who has indicated his/her understanding and acceptance.     Plan Discussed with: CRNA and Anesthesiologist  Anesthesia Plan Comments:         Anesthesia Quick Evaluation

## 2014-07-27 DIAGNOSIS — J449 Chronic obstructive pulmonary disease, unspecified: Secondary | ICD-10-CM

## 2014-07-27 LAB — CBC
HCT: 35.6 % — ABNORMAL LOW (ref 39.0–52.0)
Hemoglobin: 11.1 g/dL — ABNORMAL LOW (ref 13.0–17.0)
MCH: 29.1 pg (ref 26.0–34.0)
MCHC: 31.2 g/dL (ref 30.0–36.0)
MCV: 93.2 fL (ref 78.0–100.0)
Platelets: 131 10*3/uL — ABNORMAL LOW (ref 150–400)
RBC: 3.82 MIL/uL — AB (ref 4.22–5.81)
RDW: 16.4 % — ABNORMAL HIGH (ref 11.5–15.5)
WBC: 8.9 10*3/uL (ref 4.0–10.5)

## 2014-07-27 LAB — BASIC METABOLIC PANEL
Anion gap: 8 (ref 5–15)
BUN: 22 mg/dL (ref 6–23)
CALCIUM: 8.6 mg/dL (ref 8.4–10.5)
CHLORIDE: 101 meq/L (ref 96–112)
CO2: 30 mmol/L (ref 19–32)
Creatinine, Ser: 1.4 mg/dL — ABNORMAL HIGH (ref 0.50–1.35)
GFR calc Af Amer: 58 mL/min — ABNORMAL LOW (ref >90)
GFR calc non Af Amer: 50 mL/min — ABNORMAL LOW (ref >90)
GLUCOSE: 129 mg/dL — AB (ref 70–99)
POTASSIUM: 4.4 mmol/L (ref 3.5–5.1)
Sodium: 139 mmol/L (ref 135–145)

## 2014-07-27 MED ORDER — OXYCODONE HCL 5 MG PO TABS
10.0000 mg | ORAL_TABLET | ORAL | Status: DC | PRN
  Administered 2014-07-27 – 2014-07-30 (×11): 10 mg via ORAL
  Filled 2014-07-27 (×12): qty 2

## 2014-07-27 NOTE — Progress Notes (Signed)
PULMONARY / CRITICAL CARE MEDICINE   Name: Lucas [name] MRN: [id number] DOB: October 14, 1945    ADMISSION DATE:  07/26/2014 CONSULTATION DATE:  1/15  REFERRING MD :  [facility]  CHIEF COMPLAINT:  Wheeze  INITIAL PRESENTATION: For rt fem-pop.  COPD followed by Dr Chase Caller in [employer] office   STUDIES:    SIGNIFICANT EVENTS: 1/15 rt fem-pop  SUBJECTIVE:  Pt feels better 07/27/2014   VITAL SIGNS: Temp:  [97.5 F (36.4 C)-98.9 F (37.2 C)] 98.9 F (37.2 C) (01/16 0721) Pulse Rate:  [33-95] 62 (01/16 0911) Resp:  [12-29] 25 (01/16 0332) BP: (113-143)/(46-81) 126/61 mmHg (01/16 0332) SpO2:  [85 %-100 %] 92 % (01/16 0911)   INTAKE / OUTPUT:  Intake/Output Summary (Last 24 hours) at 07/27/14 0933 Last data filed at 07/27/14 0700  Gross per 24 hour  Intake   2420 ml  Output   1775 ml  Net    645 ml    PHYSICAL EXAMINATION: General:  EWM  NAD at rest Neuro:  Intact , follows commands HEENT:  Short neck, No JVD/LAN Cardiovascular:  HSD RRR Lungs:  Improved BS  Abdomen:  Soft + BS Musculoskeletal:  Rt leg femoral and popliteal surgery sites CDI. Rt foot warm Skin: dry  LABS:  CBC  Recent Labs Lab 07/23/14 0939 07/27/14 0240  WBC 9.4 8.9  HGB 13.6 11.1*  HCT 42.9 35.6*  PLT 144* 131*   Coag's  Recent Labs Lab 07/23/14 0939  APTT 29  INR 1.10   BMET  Recent Labs Lab 07/23/14 0939 07/27/14 0240  NA 140 139  K 3.8 4.4  CL 105 101  CO2 25 30  BUN 24* 22  CREATININE 1.33 1.40*  GLUCOSE 109* 129*   Electrolytes  Recent Labs Lab 07/23/14 0939 07/27/14 0240  CALCIUM 9.7 8.6   Sepsis Markers No results for input(s): LATICACIDVEN, PROCALCITON, O2SATVEN in the last 168 hours. ABG No results for input(s): PHART, PCO2ART, PO2ART in the last 168 hours. Liver Enzymes  Recent Labs Lab 07/23/14 0939  AST 26  ALT 22  ALKPHOS 55  BILITOT 0.6  ALBUMIN 3.9   Cardiac Enzymes No results for input(s): TROPONINI, PROBNP in the last 168  hours. Glucose No results for input(s): GLUCAP in the last 168 hours.  Imaging No results found.   ASSESSMENT     SMOKER   Essential hypertension   COPD, moderate   Chronic pain   History of renal cell cancer   CAD (coronary artery disease) of artery bypass graft   Acute-on-chronic kidney injury Lab Results  Component Value Date   CREATININE 1.40* 07/27/2014   CREATININE 1.33 07/23/2014   CREATININE 1.31 06/27/2014   CREATININE 1.19 05/03/2014   CREATININE 1.36* 04/26/2014   Atherosclerosis of native arteries of extremity with intermittent claudication  Rt femoral popliteal graft 1/15    PLAN: -cont BD as scheduled O2 as needed Stop smoking, I spent time 35min on smoking cessation counseling 07/27/2014 Surgical concerns per [facility] Continue antihypertensives Pain control per [facility]  TODAY'S SUMMARY:  69 yo WM with COPD, O2 dependent, continues to smoke, PVD who had rt common femoral to rt popliteal artery today.  PCCM consulted for COPD. PCCM will follow daily.   Elsie Stain, MD Beeper  [phone]  Cell  [phone]  If no response or cell goes to voicemail, call beeper [phone] 07/27/2014 9:34 AM

## 2014-07-27 NOTE — Progress Notes (Signed)
UR Completed.  [phone]  

## 2014-07-27 NOTE — Progress Notes (Addendum)
  [facility] Progress Note  07/27/2014 9:41 AM 1 Day Post-Op  Subjective:  "Feeling coming back to right foot." Pain with incisions. Not ambulating due to pain. Denies any shortness of breath.   Filed Vitals:   07/27/14 0911  BP:   Pulse: 62  Temp:   Resp:     Physical Exam: Incisions:  Right groin incision and right lower leg incision clean, dry and intact.  Extremities:  Brisk left PT doppler signal. Biphasic right DP doppler signal.  Cardiac: regular rate and rhythm Lungs: some expiratory wheezes  CBC    Component Value Date/Time   WBC 8.9 07/27/2014 0240   RBC 3.82* 07/27/2014 0240   HGB 11.1* 07/27/2014 0240   HCT 35.6* 07/27/2014 0240   PLT 131* 07/27/2014 0240   MCV 93.2 07/27/2014 0240   MCH 29.1 07/27/2014 0240   MCHC 31.2 07/27/2014 0240   RDW 16.4* 07/27/2014 0240   LYMPHSABS 1.2 05/13/2014 2140   MONOABS 0.7 05/13/2014 2140   EOSABS 0.1 05/13/2014 2140   BASOSABS 0.0 05/13/2014 2140    BMET    Component Value Date/Time   NA 139 07/27/2014 0240   K 4.4 07/27/2014 0240   CL 101 07/27/2014 0240   CO2 30 07/27/2014 0240   GLUCOSE 129* 07/27/2014 0240   BUN 22 07/27/2014 0240   CREATININE 1.40* 07/27/2014 0240   CREATININE 1.19 05/03/2014 1159   CALCIUM 8.6 07/27/2014 0240   GFRNONAA 50* 07/27/2014 0240   GFRAA 58* 07/27/2014 0240    INR    Component Value Date/Time   INR 1.10 07/23/2014 0939     Intake/Output Summary (Last 24 hours) at 07/27/14 0941 Last data filed at 07/27/14 0700  Gross per 24 hour  Intake   2420 ml  Output   1775 ml  Net    645 ml     Assessment:  69 y.o. male is s/p: Right common femoral to below knee popliteal artery bypass graft with 6 mm external ring propatent 1 Day Post-Op  Plan: -Good doppler signals right foot. Incisions healing well.  -Breathing is better today. Albuterol nebs prn Appreciate PCCM following.  -Adjust pain medications for better pain control.  -Increase  mobilization. -CHF and paroxysmal afib: Will restart Eliquis tomorrow.  -Dispo: transfer to [place], PA-C [facility] Office: [phone] Pager: [phone] 07/27/2014 9:41 AM   Agree with above. Brisk doppler flow to right foot. Incisions look fine.  Start Eliquis Sunday.   Deitra Mayo, MD, [place] [phone] 07/27/2014

## 2014-07-27 NOTE — Evaluation (Signed)
Physical Therapy Evaluation Patient Details Name: Lucas [name] MRN: [id number] DOB: 1945/09/02 Today's Date: 07/27/2014   History of Present Illness  Patient is a 69 year old male s/p right FPBG on 07/26/14 by Dr. Trula Slade. PMHx of COPD, CHG, AFib, HTN, prostate cancer  Clinical Impression  Pt very pleasant and eager to mobilize. Pt educated for mobility, ROM and function post op. Pt with periodic assist at home and if able to complete stairs and [name] mod I level would be able to D/C home with HHPT but otherwise may require ST-SNF. Pt required maintained 2L O2 with activity with sats 86-93% on 2L with cues for posture, breathing throughout. Pt will benefit from continued therapy to maximize mobility, balance, strength and function to decrease burden of care.     Follow Up Recommendations SNF;Supervision for mobility/OOB    Equipment Recommendations  Rolling walker with 5" wheels;3in1 (PT)    Recommendations for Other Services OT consult     Precautions / Restrictions Precautions Precautions: Fall Precaution Comments: watch sats      Mobility  Bed Mobility Overal bed mobility: Modified Independent                Transfers Overall transfer level: Needs assistance   Transfers: Sit to/from Stand Sit to Stand: Supervision         General transfer comment: cues for hand placement and RLE positioning  Ambulation/Gait Ambulation/Gait assistance: Min guard Ambulation Distance (Feet): 70 Feet Assistive device: Rolling walker (2 wheeled) Gait Pattern/deviations: Step-to pattern;Decreased stance time - right;Decreased weight shift to right   Gait velocity interpretation: Below normal speed for age/gender General Gait Details: cues for sequence with pt able to achieve right heel down but very limited tolerance for weight bearing on RLE  Stairs            Wheelchair Mobility    Modified Rankin (Stroke Patients Only)       Balance                                              Pertinent Vitals/Pain Pain Assessment: 0-10 Pain Score: 5  Pain Location: RLE hip and calf Pain Descriptors / Indicators: Aching;Burning Pain Intervention(s): Repositioned;Patient requesting pain meds-RN notified    Home Living Family/patient expects to be discharged to:: Private residence Living Arrangements: Alone Available Help at Discharge: Friend(s);Available PRN/intermittently Type of Home: Apartment Home Access: Stairs to enter Entrance Stairs-Rails: Right Entrance Stairs-Number of Steps: 10 landing after 5 steps Home Layout: One level Home Equipment: Cane - single point      Prior Function Level of Independence: Independent               Hand Dominance        Extremity/Trunk Assessment   Upper Extremity Assessment: Overall WFL for tasks assessed           Lower Extremity Assessment: RLE deficits/detail RLE Deficits / Details: decreased strength and ROM secondary to post op pain    Cervical / Trunk Assessment: Normal  Communication   Communication: No difficulties  Cognition Arousal/Alertness: Awake/alert Behavior During Therapy: WFL for tasks assessed/performed Overall Cognitive Status: Within Functional Limits for tasks assessed                      General Comments      Exercises General Exercises - Lower Extremity  Short Arc Quad: AROM;Seated;Right;5 reps      Assessment/Plan    PT Assessment Patient needs continued PT services  PT Diagnosis Difficulty walking;Acute pain   PT Problem List Decreased strength;Decreased range of motion;Decreased activity tolerance;Decreased knowledge of use of DME;Pain;Cardiopulmonary status limiting activity;Decreased mobility  PT Treatment Interventions Gait training;Stair training;Functional mobility training;Therapeutic activities;Therapeutic exercise;Patient/family education;DME instruction   PT Goals (Current goals can be found in the Care Plan section)  Acute Rehab PT Goals Patient Stated Goal: return home PT Goal Formulation: With patient Time For Goal Achievement: 08/10/14 Potential to Achieve Goals: Fair    Frequency Min 3X/week   Barriers to discharge Decreased caregiver support      Co-evaluation               End of Session   Activity Tolerance: Patient tolerated treatment well Patient left: in chair;with call bell/phone within [name];with nursing/sitter in room Nurse Communication: Mobility status         Time: 0800-0821 PT Time Calculation (min) (ACUTE ONLY): 21 min   Charges:   PT Evaluation $Initial PT Evaluation Tier I: 1 Procedure PT Treatments $Gait Training: 8-22 mins   PT G CodesMelford [name] 07/27/2014, 9:15 AM Lucas [name], [place]

## 2014-07-27 NOTE — Progress Notes (Signed)
Report called to [facility] on 2W.

## 2014-07-28 NOTE — Progress Notes (Signed)
CSW Armed forces technical officer) spoke with pt about PT recommendation. Pt informed CSW he does not want to dc to SNF and would prefer to dc home with home health. Pt stated he has had [facility] help him at home before and it worked out well. Pt informed CSW he lives alone but his significant other can assist as needed. Pt has no further social work needs.   [place], LCSWA Weekend CSW [phone]

## 2014-07-28 NOTE — Discharge Instructions (Signed)
Information on my medicine - ELIQUIS (apixaban)  This medication education was reviewed with me or my healthcare representative as part of my discharge preparation.  The pharmacist that spoke with me during my hospital stay was:  [facility]  Why was Eliquis prescribed for you? Eliquis was prescribed for you to reduce the risk of forming blood clots that can cause a stroke if you have a medical condition called atrial fibrillation (a type of irregular heartbeat) OR to reduce the risk of a blood clots forming after orthopedic surgery.  What do You need to know about Eliquis ? Take your Eliquis TWICE DAILY - one tablet in the morning and one tablet in the evening with or without food.  It would be best to take the doses about the same time each day.  If you have difficulty swallowing the tablet whole please discuss with your pharmacist how to take the medication safely.  Take Eliquis exactly as prescribed by your doctor and DO NOT stop taking Eliquis without talking to the doctor who prescribed the medication.  Stopping may increase your risk of developing a new clot or stroke.  Refill your prescription before you run out.  After discharge, you should have regular check-up appointments with your healthcare provider that is prescribing your Eliquis.  In the future your dose may need to be changed if your kidney function or weight changes by a significant amount or as you get older.  What do you do if you miss a dose? If you miss a dose, take it as soon as you remember on the same day and resume taking twice daily.  Do not take more than one dose of ELIQUIS at the same time.  Important Safety Information A possible side effect of Eliquis is bleeding. You should call your healthcare provider right away if you experience any of the following: ? Bleeding from an injury or your nose that does not stop. ? Unusual colored urine (red or dark brown) or unusual colored  stools (red or black). ? Unusual bruising for unknown reasons. ? A serious fall or if you hit your head (even if there is no bleeding).  Some medicines may interact with Eliquis and might increase your risk of bleeding or clotting while on Eliquis. To help avoid this, consult your healthcare provider or pharmacist prior to using any new prescription or non-prescription medications, including herbals, vitamins, non-steroidal anti-inflammatory drugs (NSAIDs) and supplements.  This website has more information on Eliquis (apixaban): [email or web address].

## 2014-07-28 NOTE — Progress Notes (Addendum)
  [facility] Progress Note  07/28/2014 8:11 AM 2 Days Post-Op  Subjective:  Moderate pain with leg incisions. Foot feels better. Denies any shortness of breath.   Filed Vitals:   07/28/14 0349  BP: 130/62  Pulse: 51  Temp: 98.2 F (36.8 C)  Resp: 20   Physical Exam: Incisions:  Right groin incision and right lower leg incisions healing well.  Extremities: Bilateral lower extremity edema. Brisk right PT doppler signal.  Cardiac: RRR Lungs: Some expiratory wheezes  CBC    Component Value Date/Time   WBC 8.9 07/27/2014 0240   RBC 3.82* 07/27/2014 0240   HGB 11.1* 07/27/2014 0240   HCT 35.6* 07/27/2014 0240   PLT 131* 07/27/2014 0240   MCV 93.2 07/27/2014 0240   MCH 29.1 07/27/2014 0240   MCHC 31.2 07/27/2014 0240   RDW 16.4* 07/27/2014 0240   LYMPHSABS 1.2 05/13/2014 2140   MONOABS 0.7 05/13/2014 2140   EOSABS 0.1 05/13/2014 2140   BASOSABS 0.0 05/13/2014 2140    BMET    Component Value Date/Time   NA 139 07/27/2014 0240   K 4.4 07/27/2014 0240   CL 101 07/27/2014 0240   CO2 30 07/27/2014 0240   GLUCOSE 129* 07/27/2014 0240   BUN 22 07/27/2014 0240   CREATININE 1.40* 07/27/2014 0240   CREATININE 1.19 05/03/2014 1159   CALCIUM 8.6 07/27/2014 0240   GFRNONAA 50* 07/27/2014 0240   GFRAA 58* 07/27/2014 0240    INR    Component Value Date/Time   INR 1.10 07/23/2014 0939     Intake/Output Summary (Last 24 hours) at 07/28/14 0811 Last data filed at 07/28/14 0600  Gross per 24 hour  Intake     50 ml  Output   1010 ml  Net   -960 ml    Assessment:  69 y.o. male is s/p: Right common femoral to below knee popliteal artery bypass graft with 6 mm external ring propatent  2 Days Post-Op  Plan: -Brisk right PT doppler signal. Incisions intact.  -Will restart Eliquis today.  -Encourage leg elevation for swelling.  -Continue mobilization and pain control.  -DM: CBGs stable.  -COPD: albuterol nebs prn -HTN: stable. Continue  antihypertensives.  -Case management consult ordered as patient lives alone at home.  -Dispo: Discharge in next few days when pain and mobilization improved.    Virgina Jock, PA-C [facility] Office: [phone] Pager: [phone] 07/28/2014 8:11 AM  Agree with above. Moderate swelling right leg. Plantar and dorsi flexion intact.  Sensation intact first web space. I have encouraged him to elevate his leg.  Continue to mobilize.  Deitra Mayo, MD, [place] [phone] 07/28/2014

## 2014-07-29 ENCOUNTER — Encounter: Payer: Medicare Other | Admitting: Surgery

## 2014-07-29 ENCOUNTER — Encounter (HOSPITAL_COMMUNITY): Payer: Self-pay | Admitting: Surgery

## 2014-07-29 DIAGNOSIS — I739 Peripheral vascular disease, unspecified: Secondary | ICD-10-CM

## 2014-07-29 DIAGNOSIS — Z720 Tobacco use: Secondary | ICD-10-CM

## 2014-07-29 LAB — CBC
HEMATOCRIT: 35.7 % — AB (ref 39.0–52.0)
Hemoglobin: 11.3 g/dL — ABNORMAL LOW (ref 13.0–17.0)
MCH: 29.2 pg (ref 26.0–34.0)
MCHC: 31.7 g/dL (ref 30.0–36.0)
MCV: 92.2 fL (ref 78.0–100.0)
PLATELETS: 140 10*3/uL — AB (ref 150–400)
RBC: 3.87 MIL/uL — ABNORMAL LOW (ref 4.22–5.81)
RDW: 16.3 % — AB (ref 11.5–15.5)
WBC: 9 10*3/uL (ref 4.0–10.5)

## 2014-07-29 LAB — BASIC METABOLIC PANEL
ANION GAP: 9 (ref 5–15)
BUN: 24 mg/dL — ABNORMAL HIGH (ref 6–23)
CO2: 25 mmol/L (ref 19–32)
CREATININE: 1.49 mg/dL — AB (ref 0.50–1.35)
Calcium: 9.3 mg/dL (ref 8.4–10.5)
Chloride: 102 mEq/L (ref 96–112)
GFR calc Af Amer: 53 mL/min — ABNORMAL LOW (ref >90)
GFR calc non Af Amer: 46 mL/min — ABNORMAL LOW (ref >90)
Glucose, Bld: 95 mg/dL (ref 70–99)
POTASSIUM: 4.1 mmol/L (ref 3.5–5.1)
SODIUM: 136 mmol/L (ref 135–145)

## 2014-07-29 MED ORDER — FUROSEMIDE 10 MG/ML IJ SOLN
40.0000 mg | Freq: Once | INTRAMUSCULAR | Status: AC
  Administered 2014-07-29: 40 mg via INTRAVENOUS
  Filled 2014-07-29: qty 4

## 2014-07-29 NOTE — Progress Notes (Signed)
VASCULAR LAB PRELIMINARY  ARTERIAL  ABI completed:    RIGHT    LEFT    PRESSURE WAVEFORM  PRESSURE WAVEFORM  BRACHIAL  triphasic BRACHIAL 135 triphasic  DP   DP    AT 86 Severely dampened monophasic AT 127 monophasic  PT 100 monophasic PT 114 monophasic  PER   PER    GREAT TOE  NA GREAT TOE  NA    RIGHT LEFT  ABI 0.74 0.94     Breyonna Nault, RVT 07/29/2014, 2:44 PM

## 2014-07-29 NOTE — Progress Notes (Signed)
PULMONARY / CRITICAL CARE MEDICINE   Name: Lucas [name] MRN: [id number] DOB: August 27, 1945    ADMISSION DATE:  07/26/2014 CONSULTATION DATE:  1/15  REFERRING MD :  [facility]  CHIEF COMPLAINT:  Wheeze  INITIAL PRESENTATION: 69 yo male with hx COPD admitted 1/15 for fem-pop bypass.  PCCM consulted for COPD peri-op.    STUDIES:    SIGNIFICANT EVENTS: 1/15 rt fem-pop  SUBJECTIVE:  Slightly more SOB this am.  Feels "puffy".  C/o RLE pain and swelling.   VITAL SIGNS: Temp:  [97.9 F (36.6 C)-98.5 F (36.9 C)] 97.9 F (36.6 C) (01/18 0510) Pulse Rate:  [69-79] 70 (01/18 0903) Resp:  [18-20] 18 (01/18 0903) BP: (122-144)/(74-83) 144/83 mmHg (01/18 0510) SpO2:  [92 %-94 %] 92 % (01/18 0903) Weight:  [227 lb 8.2 oz (103.2 kg)] 227 lb 8.2 oz (103.2 kg) (01/18 0528)   INTAKE / OUTPUT:  Intake/Output Summary (Last 24 hours) at 07/29/14 3267 Last data filed at 07/29/14 0515  Gross per 24 hour  Intake    360 ml  Output   2650 ml  Net  -2290 ml    PHYSICAL EXAMINATION: General:  Pleasant male, NAD sitting on side of bed  Neuro: awake, alert, appropriate, MAE  HEENT:  Short neck, No JVD/LAN Cardiovascular:  HSD RRR Lungs:  resps even, non labored on RA, prolonged expiration, expiratory wheeze, bibasilar crackles  Abdomen:  Soft + BS Musculoskeletal:  Rt leg femoral and popliteal surgery sites CDI. Rt foot warm, increased RLE swelling, calf warm/erythematous Skin: dry  LABS:  CBC  Recent Labs Lab 07/23/14 0939 07/27/14 0240 07/29/14 0326  WBC 9.4 8.9 9.0  HGB 13.6 11.1* 11.3*  HCT 42.9 35.6* 35.7*  PLT 144* 131* 140*   Coag's  Recent Labs Lab 07/23/14 0939  APTT 29  INR 1.10   BMET  Recent Labs Lab 07/23/14 0939 07/27/14 0240 07/29/14 0326  NA 140 139 136  K 3.8 4.4 4.1  CL 105 101 102  CO2 25 30 25   BUN 24* 22 24*  CREATININE 1.33 1.40* 1.49*  GLUCOSE 109* 129* 95   Electrolytes  Recent Labs Lab 07/23/14 0939 07/27/14 0240 07/29/14 0326   CALCIUM 9.7 8.6 9.3   Sepsis Markers No results for input(s): LATICACIDVEN, PROCALCITON, O2SATVEN in the last 168 hours. ABG No results for input(s): PHART, PCO2ART, PO2ART in the last 168 hours. Liver Enzymes  Recent Labs Lab 07/23/14 0939  AST 26  ALT 22  ALKPHOS 55  BILITOT 0.6  ALBUMIN 3.9   Cardiac Enzymes No results for input(s): TROPONINI, PROBNP in the last 168 hours. Glucose No results for input(s): GLUCAP in the last 168 hours.  Imaging No results found.   ASSESSMENT   COPD - moderate  Tobacco abuse  Acute on chronic kidney injury - baseline Scr ~1.3 HTN  Rt femoral popliteal graft 1/15    PLAN: Will give extra dose lasix this am  CXR in am  F/u chem  No need pulse steroids for now, cont 10mg  maintenance  cont BD as scheduled O2 as needed - 2L nocturnal at baseline  Smoking cessation! Continue antihypertensives Pain control per VVS - avoid oversedation  Surgical concerns per [facility] -- ??doppler RLE with increased c/o pain, swelling, erythema    Nickolas Madrid, NP 07/29/2014  9:52 AM Pager: [phone] or [phone]  Reviewed above, examined.  He still has cough, wheeze, and leg swelling.  Has scattered rhonchi on exam.  Will continue lasix, and adjust BD  regimen.  Chesley Mires, MD [facility] Pulmonary/Critical Care 07/29/2014, 11:38 AM Pager:  [phone] After 3pm call: [phone]

## 2014-07-29 NOTE — Progress Notes (Addendum)
  [facility] Progress Note  07/29/2014 8:48 AM 3 Days Post-Op  Subjective:  Pain continuing to improve. Was ambulating around room with walker.   Filed Vitals:   07/29/14 0510  BP: 144/83  Pulse: 69  Temp: 97.9 F (36.6 C)  Resp: 18    Physical Exam: Incisions:  Right groin incision and right lower leg incisions healing well. Some swelling of right thigh.  Extremities:  Right foot is warm. Sensation and motor intact right foot.   CBC    Component Value Date/Time   WBC 9.0 07/29/2014 0326   RBC 3.87* 07/29/2014 0326   HGB 11.3* 07/29/2014 0326   HCT 35.7* 07/29/2014 0326   PLT 140* 07/29/2014 0326   MCV 92.2 07/29/2014 0326   MCH 29.2 07/29/2014 0326   MCHC 31.7 07/29/2014 0326   RDW 16.3* 07/29/2014 0326   LYMPHSABS 1.2 05/13/2014 2140   MONOABS 0.7 05/13/2014 2140   EOSABS 0.1 05/13/2014 2140   BASOSABS 0.0 05/13/2014 2140    BMET    Component Value Date/Time   NA 136 07/29/2014 0326   K 4.1 07/29/2014 0326   CL 102 07/29/2014 0326   CO2 25 07/29/2014 0326   GLUCOSE 95 07/29/2014 0326   BUN 24* 07/29/2014 0326   CREATININE 1.49* 07/29/2014 0326   CREATININE 1.19 05/03/2014 1159   CALCIUM 9.3 07/29/2014 0326   GFRNONAA 46* 07/29/2014 0326   GFRAA 53* 07/29/2014 0326    INR    Component Value Date/Time   INR 1.10 07/23/2014 0939     Intake/Output Summary (Last 24 hours) at 07/29/14 0848 Last data filed at 07/29/14 0515  Gross per 24 hour  Intake    360 ml  Output   2650 ml  Net  -2290 ml     Assessment:  69 y.o. male is s/p: Right common femoral to below knee popliteal artery bypass graft with 6 mm external ring propatent  3 Days Post-Op  Plan: -Keep right leg elevated. -Continue mobilization  -On Eliquis.  -Albuterol neb prn.  -Dispo: discharge when mobilization and pain improved. Anticipate discharge in next several days.     Virgina Jock, PA-C [facility] Office: [phone] Pager:  [phone] 07/29/2014 8:48 AM     I agree with the above.  The patient's wounds are healing nicely.  He still has some swelling in his leg which I have encouraged him to keep his leg elevated.  It is difficult for him to get him to place his leg over his heart as he has trouble breathing and not type of a position.  He will continue with ambulation and mobilization with plans for discharge in the next 1-3 days.  Annamarie Major

## 2014-07-29 NOTE — Evaluation (Signed)
Occupational Therapy Evaluation Patient Details Name: OLUWANIFEMI SUSMAN MRN: [id number] DOB: 03-12-1946 Today's Date: 07/29/2014    History of Present Illness Patient is a 69 year old male s/p right FPBG on 07/26/14 by Dr. Trula Slade. PMHx of COPD, CHG, AFib, HTN, prostate cancer   Clinical Impression   Pt demonstrates decline in function with ADLs and ADL mobility safety with decreased strength and endurance. Pt would benefit from acute OT services to address impairments to increase level of function and safety. Pt states that he live sat home alone and would like to have some assistance come in. Pt's O2 SATs 88-92% during session ans pt instructed to perform deep breathing to recover to >90% on 2 ocassions    Follow Up Recommendations  SNF;Supervision/Assistance - 24 hour    Equipment Recommendations  3 in 1 bedside comode;Tub/shower seat    Recommendations for Other Services       Precautions / Restrictions Precautions Precautions: Fall Precaution Comments: moniter O2 SATs Restrictions Weight Bearing Restrictions: No      Mobility Bed Mobility Overal bed mobility: Modified Independent                Transfers Overall transfer level: Needs assistance   Transfers: Sit to/from Stand Sit to Stand: Supervision              Balance Overall balance assessment: Needs assistance Sitting-balance support: No upper extremity supported;Feet supported Sitting balance-Leahy Scale: Good     Standing balance support: Single extremity supported;Bilateral upper extremity supported;During functional activity Standing balance-Leahy Scale: Fair                              ADL Overall ADL's : Needs assistance/impaired     Grooming: Wash/dry hands;Wash/dry face;Set up;Supervision/safety;Standing   Upper Body Bathing: Set up;Supervision/ safety;Sitting;Standing   Lower Body Bathing: Set up;Min guard;Sit to/from stand   Upper Body Dressing : Set  up;Supervision/safety;Sitting;Standing   Lower Body Dressing: Set up;Min guard;Sit to/from stand   Toilet Transfer: Supervision/safety;RW;Grab bars;Comfort height toilet   Toileting- Clothing Manipulation and Hygiene: Supervision/safety;Sit to/from stand   Tub/ Shower Transfer: Min guard;Grab bars;3 in 1   Functional mobility during ADLs: Supervision/safety       Vision  wears glasses at all times                   Perception Perception Perception Tested?: No   Praxis Praxis Praxis tested?: Not tested    Pertinent Vitals/Pain Pain Assessment: 0-10 Pain Score: 8  Pain Descriptors / Indicators: Sharp;Throbbing Pain Intervention(s): Monitored during session;Repositioned     Hand Dominance Right   Extremity/Trunk Assessment Upper Extremity Assessment Upper Extremity Assessment: Overall WFL for tasks assessed;Generalized weakness   Lower Extremity Assessment Lower Extremity Assessment: Defer to PT evaluation   Cervical / Trunk Assessment Cervical / Trunk Assessment: Normal   Communication Communication Communication: No difficulties   Cognition Arousal/Alertness: Awake/alert Behavior During Therapy: WFL for tasks assessed/performed Overall Cognitive Status: Within Functional Limits for tasks assessed                     General Comments   pt very pleasant and cooperative                 Home Living Family/patient expects to be discharged to:: Private residence Living Arrangements: Alone Available Help at Discharge: Available PRN/intermittently Type of Home: House Home Access: Stairs to enter [employer] of Steps:  2 Entrance Stairs-Rails: Right Home Layout: One level     Bathroom Shower/Tub: Occupational psychologist: Handicapped height     Home Equipment: Cane - single point;Grab bars - tub/shower          Prior Functioning/Environment Level of Independence: Independent             OT Diagnosis:  Generalized weakness;Acute pain   OT Problem List: Decreased strength;Decreased knowledge of use of DME or AE;Decreased activity tolerance;Impaired balance (sitting and/or standing);Pain   OT Treatment/Interventions: Self-care/ADL training;Therapeutic exercise;Patient/family education;Balance training;Therapeutic activities;DME and/or AE instruction    OT Goals(Current goals can be found in the care plan section) Acute Rehab OT Goals Patient Stated Goal: return home OT Goal Formulation: With patient Time For Goal Achievement: 08/05/14 Potential to Achieve Goals: Good ADL Goals Pt Will Perform Grooming: with set-up;standing;with modified independence Pt Will Perform Lower Body Bathing: with supervision;with set-up;sit to/from stand Pt Will Perform Lower Body Dressing: with supervision;with set-up;sit to/from stand Pt Will Transfer to Toilet: with modified independence;grab bars (3 in 1 over toilet) Pt Will Perform Toileting - Clothing Manipulation and hygiene: with modified independence;sit to/from stand Pt Will Perform Tub/Shower Transfer: with supervision;shower seat;3 in 1;grab bars  OT Frequency: Min 2X/week   Barriers to D/C: Decreased caregiver support  pt lives at home alone                     End of Session Equipment Utilized During Treatment: Rolling walker  Activity Tolerance: Patient tolerated treatment well Patient left: in bed   Time: 1042-1101 OT Time Calculation (min): 19 min Charges:  OT General Charges $OT Visit: 1 Procedure OT Evaluation $Initial OT Evaluation Tier I: 1 Procedure OT Treatments $Therapeutic Activity: 8-22 mins G-Codes:    Britt Bottom 07/29/2014, 12:04 PM

## 2014-07-30 ENCOUNTER — Inpatient Hospital Stay (HOSPITAL_COMMUNITY): Payer: Medicare Other

## 2014-07-30 NOTE — Care Management Note (Signed)
    Page 1 of 2   07/31/2014     2:43:36 PM CARE MANAGEMENT NOTE 07/31/2014  Patient:  Lucas [name], Lucas [name]   Account Number:  [id number]  Date Initiated:  07/30/2014  Documentation initiated by:  Marvetta Gibbons  Subjective/Objective Assessment:   Pt admitted s/p fempop     Action/Plan:   PTA  Pt lived at home alone, PT/OT evals   Anticipated DC Date:  07/31/2014   Anticipated DC Plan:  [facility]  CM consult      [place]   Choice offered to / List presented to:  C-1 Patient   DME arranged  Vassie Moselle      DME agency  [place].   Status of service:  Completed, signed off Medicare Important Message given?   (If response is "NO", the following Medicare IM given date fields will be blank) Date Medicare IM given:   Medicare IM given by:   Date Additional Medicare IM given:   Additional Medicare IM given by:    Discharge Disposition:  HOME/SELF CARE  Per UR Regulation:  Reviewed for med. necessity/level of care/duration of stay  If discussed at [place] of Stay Meetings, dates discussed:    Comments:  07/31/14- 30- Valentina Gu, BSN [phone] Pt for d/c home today- order for RW placed- spoke with Katie with [facility]- to deliver RW to room prior to discharge- order also placed for HH-PT- checked with [facility]- unfortunately pt does not have a qualifying dx under Medicaid for HH-PT- and would have to pay out of pocket for this service- no HH arranged.  07/30/14- 1100- Marvetta Gibbons RN, BSN [phone] Spoke with pt at bedside- per conversation pt wants to return home- does not want any type of STSNF for rehab- states that he has used [facility] in past- would like to use them again- pt has can at home- needs RW for discharge- MD please order- DME- RW for discharge- and HH-PT- NCM to f/u on orders and make referrals prior to  discharge.

## 2014-07-30 NOTE — Progress Notes (Addendum)
  [facility] Progress Note  07/30/2014 7:49 AM 4 Days Post-Op  Subjective:  Walking around with walker. Pain controlled with medication but still having some "throbbing" around incisions.    Filed Vitals:   07/30/14 0434  BP: 142/73  Pulse: 72  Temp: 98 F (36.7 C)  Resp: 19    Physical Exam: Incisions:  Groin incision intact. Right lower leg incision intact with some serosanguinous drainage.  Extremities:  Swelling of right leg. Brisk PT doppler signal.   CBC    Component Value Date/Time   WBC 9.0 07/29/2014 0326   RBC 3.87* 07/29/2014 0326   HGB 11.3* 07/29/2014 0326   HCT 35.7* 07/29/2014 0326   PLT 140* 07/29/2014 0326   MCV 92.2 07/29/2014 0326   MCH 29.2 07/29/2014 0326   MCHC 31.7 07/29/2014 0326   RDW 16.3* 07/29/2014 0326   LYMPHSABS 1.2 05/13/2014 2140   MONOABS 0.7 05/13/2014 2140   EOSABS 0.1 05/13/2014 2140   BASOSABS 0.0 05/13/2014 2140    BMET    Component Value Date/Time   NA 136 07/29/2014 0326   K 4.1 07/29/2014 0326   CL 102 07/29/2014 0326   CO2 25 07/29/2014 0326   GLUCOSE 95 07/29/2014 0326   BUN 24* 07/29/2014 0326   CREATININE 1.49* 07/29/2014 0326   CREATININE 1.19 05/03/2014 1159   CALCIUM 9.3 07/29/2014 0326   GFRNONAA 46* 07/29/2014 0326   GFRAA 53* 07/29/2014 0326    INR    Component Value Date/Time   INR 1.10 07/23/2014 0939     Intake/Output Summary (Last 24 hours) at 07/30/14 0749 Last data filed at 07/30/14 0436  Gross per 24 hour  Intake   1422 ml  Output   2100 ml  Net   -678 ml     Assessment:  69 y.o. male is s/p: Right common femoral to below knee popliteal artery bypass graft with 6 mm external ring propatent  4 Days Post-Op  Plan: -Swelling of right leg: likely postoperative. Encourage leg elevation with extra pillows when in bed as patient is unable to lay flat due to difficulty breathing.  -Continue to mobilize.   -Patient has pain clinic appointment on Thursday. He wants to aim  for discharge tomorrow morning.  -Dispo: anticipate discharge tomorrow.    Virgina Jock, PA-C [facility] Office: [phone] Pager: [phone] 07/30/2014 7:49 AM     I agree with the above.  The patient is recovering nicely.  Anticipate discharge home tomorrow  Annamarie Major

## 2014-07-30 NOTE — Progress Notes (Signed)
Physical Therapy Treatment Patient Details Name: Lucas [name] MRN: [id number] DOB: 19-Mar-1946 Today's Date: 07/30/2014    History of Present Illness Patient is a 69 year old male s/p right FPBG on 07/26/14 by Dr. Trula Slade. PMHx of COPD, CHG, AFib, HTN, prostate cancer    PT Comments    Patient making good progress towards physical therapy goals, ambulating up to 240 feet today with supervision while using a rolling walker for support. Has been using RW in halls without assistance per nursing report. Pt reported he does not have to ascend stairs to enter his apartment if he enters through the back entrance, therefore goals have been updated. States he has friends that going to check-in on him routinely and assist with his grocery shopping at d/c. Patient will continue to benefit from skilled physical therapy services at home with HHPT to further improve independence with functional mobility.   Follow Up Recommendations  Home health PT     Equipment Recommendations  Rolling walker with 5" wheels;3in1 (PT);Other (comment) (Tub bench)    Recommendations for Other Services OT consult     Precautions / Restrictions Precautions Precautions: Fall Restrictions Weight Bearing Restrictions: No    Mobility  Bed Mobility Overal bed mobility: Modified Independent                Transfers Overall transfer level: Modified independent                  Ambulation/Gait Ambulation/Gait assistance: Supervision Ambulation Distance (Feet): 240 Feet Assistive device: Rolling walker (2 wheeled) Gait Pattern/deviations: Step-through pattern;Decreased stance time - right;Decreased stride length;Antalgic;Trunk flexed Gait velocity: decreased   General Gait Details: VC for upright posture and forward gaze. Improved Rt heel strike, mildly antalgic. No loss of balance while holding onto RW for support.   Stairs            Wheelchair Mobility    Modified Rankin (Stroke  Patients Only)       Balance                                    Cognition Arousal/Alertness: Awake/alert Behavior During Therapy: WFL for tasks assessed/performed Overall Cognitive Status: Within Functional Limits for tasks assessed                      Exercises General Exercises - Lower Extremity Ankle Circles/Pumps: AROM;Both;10 reps;Supine Quad Sets: Strengthening;Right;10 reps;Seated Long Arc Quad: Strengthening;Right;10 reps;Seated Straight Leg Raises: AAROM;Strengthening;Both;10 reps;Supine    General Comments General comments (skin integrity, edema, etc.): RLE edematous - Elevated in bed with pillows      Pertinent Vitals/Pain Pain Assessment: 0-10 Pain Score:  ("hurts when I walk too far" no value given) Pain Location: RLE Pain Descriptors / Indicators: Throbbing Pain Intervention(s): Monitored during session;Repositioned  SpO2 96% on room air HR 80s-90s    Home Living                      Prior Function            PT Goals (current goals can now be found in the care plan section) Acute Rehab PT Goals Patient Stated Goal: return home PT Goal Formulation: With patient Time For Goal Achievement: 08/10/14 Potential to Achieve Goals: Fair Progress towards PT goals: Progressing toward goals    Frequency  Min 3X/week    PT Plan Equipment recommendations need  to be updated;Discharge plan needs to be updated    Co-evaluation             End of Session Equipment Utilized During Treatment: Gait belt Activity Tolerance: Patient tolerated treatment well Patient left: in bed;with call bell/phone within reach     Time: 0855-0910 PT Time Calculation (min) (ACUTE ONLY): 15 min  Charges:  $Gait Training: 8-22 mins                    G Codes:      Ellouise Newer 2014-08-22, 9:47 AM Elayne Snare, [place]

## 2014-07-31 ENCOUNTER — Telehealth: Payer: Self-pay | Admitting: Surgery

## 2014-07-31 DIAGNOSIS — G4734 Idiopathic sleep related nonobstructive alveolar hypoventilation: Secondary | ICD-10-CM

## 2014-07-31 DIAGNOSIS — R062 Wheezing: Secondary | ICD-10-CM

## 2014-07-31 LAB — BASIC METABOLIC PANEL
Anion gap: 10 (ref 5–15)
BUN: 30 mg/dL — ABNORMAL HIGH (ref 6–23)
CALCIUM: 9 mg/dL (ref 8.4–10.5)
CO2: 33 mmol/L — AB (ref 19–32)
CREATININE: 1.47 mg/dL — AB (ref 0.50–1.35)
Chloride: 99 mEq/L (ref 96–112)
GFR calc Af Amer: 54 mL/min — ABNORMAL LOW (ref >90)
GFR calc non Af Amer: 47 mL/min — ABNORMAL LOW (ref >90)
GLUCOSE: 110 mg/dL — AB (ref 70–99)
Potassium: 3.8 mmol/L (ref 3.5–5.1)
Sodium: 142 mmol/L (ref 135–145)

## 2014-07-31 LAB — MAGNESIUM: Magnesium: 2.1 mg/dL (ref 1.5–2.5)

## 2014-07-31 MED ORDER — OXYCODONE HCL 10 MG PO TABS: 10.0000 mg | ORAL_TABLET | ORAL | Status: DC | PRN

## 2014-07-31 NOTE — Progress Notes (Addendum)
PULMONARY / CRITICAL CARE MEDICINE   Name: Lucas [name] MRN: [id number] DOB: [birth year]    ADMISSION DATE:  07/26/2014 CONSULTATION DATE:  1/15  REFERRING MD :  [facility]  CHIEF COMPLAINT:  Wheeze  INITIAL PRESENTATION:  69 yo male admitted for fem-pop bypass.  PCCM consulted for COPD management.  He is followed by Dr. Chase Caller in pulmonary office.  SIGNIFICANT EVENTS: 1/15 rt fem-pop  SUBJECTIVE:  Breathing better.  No wheeze.  Not much cough.  VITAL SIGNS: Temp:  [98.1 F (36.7 C)-98.2 F (36.8 C)] 98.1 F (36.7 C) (01/20 0319) Pulse Rate:  [67-76] 76 (01/20 0319) Resp:  [16-20] 20 (01/20 0319) BP: (118-158)/(65-83) 158/83 mmHg (01/20 0319) SpO2:  [93 %-99 %] 95 % (01/20 0319) Weight:  [228 lb 6.3 oz (103.6 kg)] 228 lb 6.3 oz (103.6 kg) (01/20 0319)   INTAKE / OUTPUT:  Intake/Output Summary (Last 24 hours) at 07/31/14 0737 Last data filed at 07/31/14 0428  Gross per 24 hour  Intake    600 ml  Output   1500 ml  Net   -900 ml    PHYSICAL EXAMINATION: General: no distress Neuro: normal strength HEENT: no sinus tenderness Cardiovascular: regular Lungs: no wheeze Abdomen:  Soft, non tender Musculoskeletal: no edema Skin: dry  LABS:  CBC  Recent Labs Lab 07/27/14 0240 07/29/14 0326  WBC 8.9 9.0  HGB 11.1* 11.3*  HCT 35.6* 35.7*  PLT 131* 140*   BMET  Recent Labs Lab 07/27/14 0240 07/29/14 0326  NA 139 136  K 4.4 4.1  CL 101 102  CO2 30 25  BUN 22 24*  CREATININE 1.40* 1.49*  GLUCOSE 129* 95   Electrolytes  Recent Labs Lab 07/27/14 0240 07/29/14 0326  CALCIUM 8.6 9.3   Imaging Dg Chest 1 View  07/30/2014   CLINICAL DATA:  Difficulty breathing  EXAM: CHEST - 1 VIEW  COMPARISON:  Chest radiograph May 13, 2014 and chest CT May 14, 2014  FINDINGS: There is underlying emphysematous change. There is atelectatic change in the right base. There is no frank edema or consolidation. Heart is enlarged with pulmonary vascularity within  normal limits. Patient is status post coronary artery bypass grafting. No pneumothorax. No adenopathy.  IMPRESSION: Atelectatic change right base. No edema or consolidation. Underlying emphysema. Stable cardiac enlargement.   Electronically Signed   By: Lowella Grip M.D.   On: 07/30/2014 07:44     ASSESSMENT   Wheezing >> likely from COPD and acute on chronic systolic CHF >> much improved after diuresis and steroids. Plan: Continue lasix  COPD/emphysema. Plan: Continue dulera, combivent, scheduled prednisone 10 mg daily  Tobacco abuse. Plan: Smoking cessation  Sleep related hypoxia. Plan: Continue 2 liters oxygen at night  Pulmonary nodule on CT chest 05/14/14. Plan: Outpt follow up with Dr. Judeen Hammans for d/c home from pulmonary standpoint.  Will arrange for f/u in pulmonary office in 2 weeks >> Wednesday, Feb 3 at 2:30 PM with Dr. Chase Caller.  Chesley Mires, MD [facility] Pulmonary/Critical Care 07/31/2014, 7:54 AM Pager:  [phone] After 3pm call: [phone]

## 2014-07-31 NOTE — Progress Notes (Signed)
   VASCULAR SURGERY ASSESSMENT & PLAN:  * 5 Days Post-Op s/p: Right femoropopliteal bypass  *  He had an 11 beat run of V. Tach this morning. He is currently in normal sinus rhythm with no chest pain or shortness of breath. EKG this morning is unremarkable. Follow up potassium and magnesium are pending.  * if his labs are normal and he has no further episodes this morning I think it is safe to proceed with discharge today.  SUBJECTIVE: no complaints.  PHYSICAL EXAM: Filed Vitals:   07/30/14 1420 07/30/14 1613 07/30/14 2116 07/31/14 0319  BP: 118/70  134/65 158/83  Pulse: 76 67 69 76  Temp: 98.2 F (36.8 C)  98.1 F (36.7 C) 98.1 F (36.7 C)  TempSrc:   Oral Oral  Resp: 18 16 18 20   Height:      Weight:    228 lb 6.3 oz (103.6 kg)  SpO2: 93% 99% 95% 95%   Right foot is warm and well-perfused. Incisions look fine.  LABS: Lab Results  Component Value Date   WBC 9.0 07/29/2014   HGB 11.3* 07/29/2014   HCT 35.7* 07/29/2014   MCV 92.2 07/29/2014   PLT 140* 07/29/2014   Lab Results  Component Value Date   CREATININE 1.49* 07/29/2014   Lab Results  Component Value Date   INR 1.10 07/23/2014   CBG (last 3)  No results for input(s): GLUCAP in the last 72 hours.  Active Problems:   SMOKER   Essential hypertension   OSA (obstructive sleep apnea)   COPD, moderate   Chronic pain   History of renal cell cancer   CAD (coronary artery disease) of artery bypass graft   Acute-on-chronic kidney injury   Atherosclerosis of native arteries of extremity with intermittent claudication    Gae Gallop Beeper: [phone] 07/31/2014

## 2014-07-31 NOTE — Progress Notes (Signed)
Pt had 11 beat run of Vtach; pt asymptomatic resting in bed. Scot Dock paged. Orders were placed per M.D. Labs: Potassium & Magnesium. 12 Lead EKG also ordered. EKG completed; no significant changes noted. EKG placed in chart. RN will cont to monitor.

## 2014-07-31 NOTE — Telephone Encounter (Addendum)
-----   Message from Mena Goes, RN sent at 07/31/2014 10:40 AM EST ----- Regarding: Schedule   ----- Message -----    From: Ulyses Amor, PA-C    Sent: 07/31/2014   9:57 AM      To: [facility] Charge Pool  F/U with Dr. Trula Slade in 2 weeks s/p fem-pop   07/31/14: spoke with pt, dpm

## 2014-08-01 NOTE — Discharge Summary (Signed)
[facility] Discharge Summary   Patient ID:  Lucas [name] MRN: [id number] DOB/AGE: June 26, 1946 69 y.o.  Admit date: 07/26/2014 Discharge date: 08/01/2014 Date of Surgery: 07/26/2014 Surgeon: Surgeon(s): Serafina Mitchell, MD  Admission Diagnosis: Peripheral Arterial Disease with right leg claudication I70.211  Discharge Diagnoses:  Peripheral Arterial Disease with right leg claudication I70.211  Secondary Diagnoses: Past Medical History  Diagnosis Date  . CAD (coronary artery disease)     a. s/p CABG x 5;  b. 04/2012 Cath: patent LIMA->LAD and VG->OM3, all other grafts occluded. c. Cath 12/2013: patent SVG-OM3 & LIMA-LAD, known occ other VG.  Marland Kitchen Hypertension   . Adjustment disorder with anxiety   . Hyperlipidemia     a. Unable to take statins.  Marland Kitchen BPH (benign prostatic hypertrophy)   . COPD (chronic obstructive pulmonary disease)     a. on home O2.  . Arthritis   . Ischemic cardiomyopathy     a. 06/2013 Echo: EF 30-35%, no reg wma's, Gr2 DD, triv AI, mod dil LA, nl RV fxn.  . Chronic systolic CHF (congestive heart failure)     a. 06/2013 Echo: EF 30-35%. b. 11/08/13 EF 40-45%, LVH, HK of the mid-distalanterseptal myocardium, trivial Ao regurg, calcified mitral annulus, RA mildly dilated  . Prostate cancer dx'd 2012  . Renal cancer dx'd 1997    lt nephrectomy  . CKD (chronic kidney disease), stage III   . Tobacco abuse     a. 54 yr hx as of 2015, transitioned to e-cig.  Marland Kitchen Syncope     a. 08/2013: the day following cath, no injury, had received multiple doses of Versed for anxiety - this was felt to be a contributing factor.  Marland Kitchen PAF (paroxysmal atrial fibrillation)     a. on Eliquis 5 mg bid  . Hypotension     a. H/o soft BP prohibiting ACEI/ARB use.  . Chronic pain   . PAD (peripheral artery disease)     a. 12/2013: PV angio s/p angioplasty to R external iliac artery stenosis, also has R SFA occlusion with reconstitution of above-knee popliteal artery, 2  vessel runoff via peroneal and posterior tib.b. LE doppler 02/2014 ABI right 0.52, left 0.95  . Pulmonary nodules     a. 12/2013:  small pulmonary nodules measuring up to 6mm in RM/LL, f/u recommended 6-12 months.  . Anxiety Dx 2004  . Anginal pain     none recent  . Shortness of breath dyspnea   . Rheumatic fever     child    Procedure(s): BYPASS GRAFT FEMORAL-POPLITEAL ARTERY- right leg with PTFE  Discharged Condition: good  HPI: Patient is back today for continued surveillance and follow-up of his right leg claudication. He is status post angioplasty of a right external iliac artery stenosis on 12/26/2013. This helped his symptoms somewhat, however he still suffers from lifestyle limiting claudication which affects his ability to walk for more than approximately 1 minute. He also is now complaining of pain in his left thigh which occurs mostly when trying to stand up. He has a long-standing history of tobacco abuse but has quit. Assessment: Severe right leg claudication  Hospital Course:  Lucas [name] is a 70 y.o. male is S/P Right Procedure(s): BYPASS GRAFT FEMORAL-POPLITEAL ARTERY- right leg with PTFE Uneventful night POD 1 with mild wheezing known COPD and long history of smoking.   Transferred to 2W in stable condition.  Eliquis was restarted POD 2.   Has scattered rhonchi on exam. Will  continue lasix, and adjust BD regimen.  POD 5 He had an 11 beat run of V. Tach this morning. He is currently in normal sinus rhythm with no chest pain or shortness of breath. EKG this morning is unremarkable. Follow up potassium and magnesium are pending.  Labs within normal range.  Okay for d/c home from pulmonary standpoint. Will arrange for f/u in pulmonary office in 2 weeks >> Wednesday, Feb 3 at 2:30 PM with Dr. Chase Caller.  Consults: Dr. Roxan Hockey Pulmonary/Critical Care    Significant Diagnostic Studies: CBC Lab Results  Component Value Date   WBC 9.0 07/29/2014   HGB 11.3*  07/29/2014   HCT 35.7* 07/29/2014   MCV 92.2 07/29/2014   PLT 140* 07/29/2014    BMET    Component Value Date/Time   NA 142 07/31/2014 0658   K 3.8 07/31/2014 0658   CL 99 07/31/2014 0658   CO2 33* 07/31/2014 0658   GLUCOSE 110* 07/31/2014 0658   BUN 30* 07/31/2014 0658   CREATININE 1.47* 07/31/2014 0658   CREATININE 1.19 05/03/2014 1159   CALCIUM 9.0 07/31/2014 0658   GFRNONAA 47* 07/31/2014 0658   GFRAA 54* 07/31/2014 0658   COAG Lab Results  Component Value Date   INR 1.10 07/23/2014   INR 1.17 04/18/2014   INR 1.12 03/08/2014     Disposition:  Discharge to :Home Discharge Instructions    Call MD for:  redness, tenderness, or signs of infection (pain, swelling, bleeding, redness, odor or green/yellow discharge around incision site)    Complete by:  As directed      Call MD for:  severe or increased pain, loss or decreased feeling  in affected limb(s)    Complete by:  As directed      Call MD for:  temperature >100.5    Complete by:  As directed      Discharge instructions    Complete by:  As directed   You may shower daily     Driving Restrictions    Complete by:  As directed   No driving for 1 week     Increase activity slowly    Complete by:  As directed   Walk with assistance use walker or cane as needed     Lifting restrictions    Complete by:  As directed   No lifting for 6 weeks     Resume previous diet    Complete by:  As directed             Medication List    TAKE these medications        albuterol 108 (90 BASE) MCG/ACT inhaler  Commonly known as:  PROVENTIL HFA;VENTOLIN HFA  Inhale 2 puffs into the lungs every 6 (six) hours as needed for wheezing or shortness of breath. Shortness of breath     albuterol (2.5 MG/3ML) 0.083% nebulizer solution  Commonly known as:  PROVENTIL  Take 3 mLs (2.5 mg total) by nebulization 4 (four) times daily. Dx J44.1     ALPRAZolam 1 MG tablet  Commonly known as:  XANAX  Take 1 tablet (1 mg total) by mouth  2 (two) times daily as needed for anxiety.     apixaban 5 MG Tabs tablet  Commonly known as:  ELIQUIS  Take 1 tablet (5 mg total) by mouth 2 (two) times daily.     aspirin EC 81 MG tablet  Take 81 mg by mouth daily.     doxazosin 8 MG tablet  Commonly known as:  CARDURA  Take 1 tablet (8 mg total) by mouth at bedtime.     Fluticasone Furoate-Vilanterol 100-25 MCG/INH Aepb  Commonly known as:  BREO ELLIPTA  Inhale 1 puff into the lungs every morning.     furosemide 20 MG tablet  Commonly known as:  LASIX  Take 2 tablets (40 mg total) by mouth 2 (two) times daily.     guaiFENesin 600 MG 12 hr tablet  Commonly known as:  MUCINEX  Take 2 tablets (1,200 mg total) by mouth 2 (two) times daily.     HYDROcodone-acetaminophen 10-325 MG per tablet  Commonly known as:  NORCO  Take 1 tablet by mouth 3 (three) times daily.     HYDROmorphone 4 MG tablet  Commonly known as:  DILAUDID  Take 4 mg by mouth every 4 (four) hours as needed for moderate pain.     ipratropium 0.02 % nebulizer solution  Commonly known as:  ATROVENT  Take 0.5 mg by nebulization 4 (four) times daily.     Ipratropium-Albuterol 20-100 MCG/ACT Aers respimat  Commonly known as:  COMBIVENT RESPIMAT  Inhale 1 puff into the lungs 4 (four) times daily.     mometasone-formoterol 100-5 MCG/ACT Aero  Commonly known as:  DULERA  Inhale 2 puffs into the lungs 2 (two) times daily.     nicotine 21 mg/24hr patch  Commonly known as:  NICODERM CQ - dosed in mg/24 hours  Place 21 mg onto the skin daily.     nitroGLYCERIN 0.3 MG SL tablet  Commonly known as:  NITROSTAT  Place 0.3 mg under the tongue every 5 (five) minutes as needed for chest pain.     Oxycodone HCl 10 MG Tabs  Take 1 tablet (10 mg total) by mouth every 4 (four) hours as needed for moderate pain.     pantoprazole 40 MG tablet  Commonly known as:  PROTONIX  Take 1 tablet (40 mg total) by mouth daily at 12 noon.     predniSONE 10 MG tablet  Commonly  known as:  DELTASONE  Take 1 tablet (10 mg total) by mouth daily.       Verbal and written Discharge instructions given to the patient. Wound care per Discharge AVS     Follow-up Information    Follow up with [facility], MD On 08/14/2014.   Specialty:  Pulmonary Disease   Why:  2:30 pm   Contact information:   [address] [place] [address] [phone]       Follow up with Trula Slade IV, [place], MD In 2 weeks.   Specialty:  Vascular Surgery   Why:  sent to office   Contact information:   [place] [address] [phone]       Signed: Laurence Slate [facility] 08/01/2014, 9:37 AM  - For VQI Registry use --- Instructions: Press F2 to tab through selections.  Delete question if not applicable.   Post-op:  Wound infection: No  Graft infection: No  Transfusion: No  If yes, 0 units given New Arrhythmia: No Ipsilateral amputation: [x ] no, [ ]  Minor, [ ]  BKA, [ ]  AKA Discharge patency: [x ] Primary, [ ]  Primary assisted, [ ]  Secondary, [ ]  Occluded Patency judged by: [x ] Dopper only, [ ]  Palpable graft pulse, [ ]  Palpable distal pulse, [ ]  ABI inc. > 0.15, [ ]  Duplex Discharge ABI: R 0.74, L 0.94  D/C Ambulatory Status: Ambulatory  Complications: MI: [x ] No, [ ]   Troponin only, [ ]  EKG or Clinical CHF: Yes Resp failure: [x ] none, [ ]  Pneumonia, [ ]  Ventilator Chg in renal function: [x ] none, [ ]  Inc. Cr > 0.5, [ ]  Temp. Dialysis, [ ]  Permanent dialysis Stroke: [x ] None, [ ]  Minor, [ ]  Major Return to OR: No  Reason for return to OR: [ ]  Bleeding, [ ]  Infection, [ ]  Thrombosis, [ ]  Revision  Discharge medications: Statin use:  No  for medical reason   ASA use:  Yes Plavix use:  No  for medical reason   Beta blocker use: No  for medical reason   Coumadin use: No  for medical reason  Eliquis

## 2014-08-08 ENCOUNTER — Telehealth (HOSPITAL_COMMUNITY): Payer: Self-pay

## 2014-08-08 NOTE — Telephone Encounter (Signed)
Spoke with patient regarding pulmonary rehab. Patient stated he has attended the program in the past. Excited about coming back. Patient currently recovering from femoral bipass. Has follow-up appt with surgeon on Feb 8. Requested that patient ask MD for exercise clearance. Patient to call back when he is ready and able to join program.

## 2014-08-08 NOTE — Telephone Encounter (Signed)
I have called and left a message with Riyan to inquire about participation in Pulmonary Rehab. Requested he return our call at [phone]. Will follow up at later date.

## 2014-08-09 ENCOUNTER — Telehealth: Payer: Self-pay | Admitting: Internal Medicine

## 2014-08-09 ENCOUNTER — Encounter (HOSPITAL_COMMUNITY): Payer: Self-pay

## 2014-08-09 ENCOUNTER — Inpatient Hospital Stay (HOSPITAL_COMMUNITY)
Admission: EM | Admit: 2014-08-09 | Discharge: 2014-08-11 | DRG: 602 | Disposition: A | Discharge status: Home / Self Care | Payer: Medicare Other | Attending: Internal Medicine | Admitting: Internal Medicine

## 2014-08-09 ENCOUNTER — Emergency Department (HOSPITAL_COMMUNITY): Payer: Medicare Other

## 2014-08-09 DIAGNOSIS — M7989 Other specified soft tissue disorders: Secondary | ICD-10-CM

## 2014-08-09 DIAGNOSIS — Z833 Family history of diabetes mellitus: Secondary | ICD-10-CM

## 2014-08-09 DIAGNOSIS — I5043 Acute on chronic combined systolic (congestive) and diastolic (congestive) heart failure: Secondary | ICD-10-CM | POA: Diagnosis present

## 2014-08-09 DIAGNOSIS — J449 Chronic obstructive pulmonary disease, unspecified: Secondary | ICD-10-CM | POA: Diagnosis present

## 2014-08-09 DIAGNOSIS — F419 Anxiety disorder, unspecified: Secondary | ICD-10-CM | POA: Diagnosis present

## 2014-08-09 DIAGNOSIS — I129 Hypertensive chronic kidney disease with stage 1 through stage 4 chronic kidney disease, or unspecified chronic kidney disease: Secondary | ICD-10-CM | POA: Diagnosis present

## 2014-08-09 DIAGNOSIS — I739 Peripheral vascular disease, unspecified: Secondary | ICD-10-CM | POA: Diagnosis present

## 2014-08-09 DIAGNOSIS — I25119 Atherosclerotic heart disease of native coronary artery with unspecified angina pectoris: Secondary | ICD-10-CM | POA: Diagnosis present

## 2014-08-09 DIAGNOSIS — F17210 Nicotine dependence, cigarettes, uncomplicated: Secondary | ICD-10-CM | POA: Diagnosis present

## 2014-08-09 DIAGNOSIS — Z888 Allergy status to other drugs, medicaments and biological substances status: Secondary | ICD-10-CM

## 2014-08-09 DIAGNOSIS — E669 Obesity, unspecified: Secondary | ICD-10-CM | POA: Diagnosis present

## 2014-08-09 DIAGNOSIS — I480 Paroxysmal atrial fibrillation: Secondary | ICD-10-CM | POA: Diagnosis present

## 2014-08-09 DIAGNOSIS — L03115 Cellulitis of right lower limb: Secondary | ICD-10-CM

## 2014-08-09 DIAGNOSIS — Z7982 Long term (current) use of aspirin: Secondary | ICD-10-CM | POA: Diagnosis not present

## 2014-08-09 DIAGNOSIS — Z79899 Other long term (current) drug therapy: Secondary | ICD-10-CM | POA: Diagnosis not present

## 2014-08-09 DIAGNOSIS — Z955 Presence of coronary angioplasty implant and graft: Secondary | ICD-10-CM

## 2014-08-09 DIAGNOSIS — Z8249 Family history of ischemic heart disease and other diseases of the circulatory system: Secondary | ICD-10-CM

## 2014-08-09 DIAGNOSIS — Z885 Allergy status to narcotic agent status: Secondary | ICD-10-CM

## 2014-08-09 DIAGNOSIS — Z825 Family history of asthma and other chronic lower respiratory diseases: Secondary | ICD-10-CM

## 2014-08-09 DIAGNOSIS — Z8546 Personal history of malignant neoplasm of prostate: Secondary | ICD-10-CM | POA: Diagnosis not present

## 2014-08-09 DIAGNOSIS — L0390 Cellulitis, unspecified: Secondary | ICD-10-CM | POA: Diagnosis present

## 2014-08-09 DIAGNOSIS — J9610 Chronic respiratory failure, unspecified whether with hypoxia or hypercapnia: Secondary | ICD-10-CM | POA: Diagnosis present

## 2014-08-09 DIAGNOSIS — G4733 Obstructive sleep apnea (adult) (pediatric): Secondary | ICD-10-CM | POA: Diagnosis present

## 2014-08-09 DIAGNOSIS — G8929 Other chronic pain: Secondary | ICD-10-CM | POA: Diagnosis present

## 2014-08-09 DIAGNOSIS — E785 Hyperlipidemia, unspecified: Secondary | ICD-10-CM | POA: Diagnosis present

## 2014-08-09 DIAGNOSIS — Z79891 Long term (current) use of opiate analgesic: Secondary | ICD-10-CM

## 2014-08-09 DIAGNOSIS — I25810 Atherosclerosis of coronary artery bypass graft(s) without angina pectoris: Secondary | ICD-10-CM | POA: Diagnosis present

## 2014-08-09 DIAGNOSIS — I10 Essential (primary) hypertension: Secondary | ICD-10-CM | POA: Diagnosis present

## 2014-08-09 DIAGNOSIS — N182 Chronic kidney disease, stage 2 (mild): Secondary | ICD-10-CM | POA: Diagnosis present

## 2014-08-09 DIAGNOSIS — Z7901 Long term (current) use of anticoagulants: Secondary | ICD-10-CM | POA: Diagnosis not present

## 2014-08-09 DIAGNOSIS — Z6834 Body mass index (BMI) 34.0-34.9, adult: Secondary | ICD-10-CM

## 2014-08-09 DIAGNOSIS — Z7952 Long term (current) use of systemic steroids: Secondary | ICD-10-CM | POA: Diagnosis not present

## 2014-08-09 DIAGNOSIS — M1990 Unspecified osteoarthritis, unspecified site: Secondary | ICD-10-CM | POA: Diagnosis present

## 2014-08-09 DIAGNOSIS — N183 Chronic kidney disease, stage 3 (moderate): Secondary | ICD-10-CM | POA: Diagnosis present

## 2014-08-09 DIAGNOSIS — Z9981 Dependence on supplemental oxygen: Secondary | ICD-10-CM

## 2014-08-09 DIAGNOSIS — L539 Erythematous condition, unspecified: Secondary | ICD-10-CM | POA: Diagnosis not present

## 2014-08-09 DIAGNOSIS — J441 Chronic obstructive pulmonary disease with (acute) exacerbation: Secondary | ICD-10-CM | POA: Diagnosis present

## 2014-08-09 DIAGNOSIS — M79604 Pain in right leg: Secondary | ICD-10-CM

## 2014-08-09 DIAGNOSIS — N400 Enlarged prostate without lower urinary tract symptoms: Secondary | ICD-10-CM | POA: Diagnosis present

## 2014-08-09 DIAGNOSIS — M79606 Pain in leg, unspecified: Secondary | ICD-10-CM

## 2014-08-09 DIAGNOSIS — I70219 Atherosclerosis of native arteries of extremities with intermittent claudication, unspecified extremity: Secondary | ICD-10-CM

## 2014-08-09 DIAGNOSIS — R0602 Shortness of breath: Secondary | ICD-10-CM

## 2014-08-09 DIAGNOSIS — F17200 Nicotine dependence, unspecified, uncomplicated: Secondary | ICD-10-CM | POA: Diagnosis present

## 2014-08-09 LAB — COMPREHENSIVE METABOLIC PANEL
ALT: 23 U/L (ref 0–53)
ANION GAP: 11 (ref 5–15)
AST: 25 U/L (ref 0–37)
Albumin: 4 g/dL (ref 3.5–5.2)
Alkaline Phosphatase: 59 U/L (ref 39–117)
BUN: 25 mg/dL — ABNORMAL HIGH (ref 6–23)
CALCIUM: 9.4 mg/dL (ref 8.4–10.5)
CHLORIDE: 106 mmol/L (ref 96–112)
CO2: 25 mmol/L (ref 19–32)
CREATININE: 1.25 mg/dL (ref 0.50–1.35)
GFR calc Af Amer: 66 mL/min — ABNORMAL LOW (ref >90)
GFR, EST NON AFRICAN AMERICAN: 57 mL/min — AB (ref >90)
Glucose, Bld: 130 mg/dL — ABNORMAL HIGH (ref 70–99)
Potassium: 3.8 mmol/L (ref 3.5–5.1)
Sodium: 142 mmol/L (ref 135–145)
Total Bilirubin: 0.6 mg/dL (ref 0.3–1.2)
Total Protein: 7.4 g/dL (ref 6.0–8.3)

## 2014-08-09 LAB — CBC
HCT: 39.4 % (ref 39.0–52.0)
HEMOGLOBIN: 12.4 g/dL — AB (ref 13.0–17.0)
MCH: 29.2 pg (ref 26.0–34.0)
MCHC: 31.5 g/dL (ref 30.0–36.0)
MCV: 92.7 fL (ref 78.0–100.0)
PLATELETS: 214 10*3/uL (ref 150–400)
RBC: 4.25 MIL/uL (ref 4.22–5.81)
RDW: 16.3 % — ABNORMAL HIGH (ref 11.5–15.5)
WBC: 10.2 10*3/uL (ref 4.0–10.5)

## 2014-08-09 LAB — HEPATIC FUNCTION PANEL
ALBUMIN: 4.1 g/dL (ref 3.5–5.2)
ALK PHOS: 62 U/L (ref 39–117)
ALT: 25 U/L (ref 0–53)
AST: 29 U/L (ref 0–37)
BILIRUBIN INDIRECT: 0.5 mg/dL (ref 0.3–0.9)
Bilirubin, Direct: 0.1 mg/dL (ref 0.0–0.5)
Total Bilirubin: 0.6 mg/dL (ref 0.3–1.2)
Total Protein: 7.6 g/dL (ref 6.0–8.3)

## 2014-08-09 LAB — URINALYSIS, ROUTINE W REFLEX MICROSCOPIC
Bilirubin Urine: NEGATIVE
Glucose, UA: NEGATIVE mg/dL
HGB URINE DIPSTICK: NEGATIVE
KETONES UR: NEGATIVE mg/dL
LEUKOCYTES UA: NEGATIVE
Nitrite: NEGATIVE
Protein, ur: NEGATIVE mg/dL
Specific Gravity, Urine: 1.027 (ref 1.005–1.030)
Urobilinogen, UA: 0.2 mg/dL (ref 0.0–1.0)
pH: 5 (ref 5.0–8.0)

## 2014-08-09 LAB — PROTIME-INR
INR: 1.25 (ref 0.00–1.49)
PROTHROMBIN TIME: 15.9 s — AB (ref 11.6–15.2)

## 2014-08-09 LAB — GLUCOSE, CAPILLARY: GLUCOSE-CAPILLARY: 231 mg/dL — AB (ref 70–99)

## 2014-08-09 LAB — TSH
TSH: 1.958 u[IU]/mL (ref 0.350–4.500)
TSH: 0.962 u[IU]/mL (ref 0.350–4.500)

## 2014-08-09 LAB — MAGNESIUM: MAGNESIUM: 2.1 mg/dL (ref 1.5–2.5)

## 2014-08-09 LAB — TROPONIN I
TROPONIN I: 0.03 ng/mL (ref <0.031)
Troponin I: 0.04 ng/mL — ABNORMAL HIGH (ref <0.031)

## 2014-08-09 LAB — BRAIN NATRIURETIC PEPTIDE: B Natriuretic Peptide: 375.4 pg/mL — ABNORMAL HIGH (ref 0.0–100.0)

## 2014-08-09 MED ORDER — INSULIN ASPART 100 UNIT/ML ~~LOC~~ SOLN
0.0000 [IU] | Freq: Three times a day (TID) | SUBCUTANEOUS | Status: DC
  Administered 2014-08-09 – 2014-08-10 (×2): 2 [IU] via SUBCUTANEOUS
  Administered 2014-08-10: 3 [IU] via SUBCUTANEOUS
  Administered 2014-08-10: 2 [IU] via SUBCUTANEOUS
  Administered 2014-08-11 (×2): 3 [IU] via SUBCUTANEOUS

## 2014-08-09 MED ORDER — PANTOPRAZOLE SODIUM 40 MG PO TBEC
40.0000 mg | DELAYED_RELEASE_TABLET | Freq: Every day | ORAL | Status: DC
  Administered 2014-08-10: 40 mg via ORAL
  Filled 2014-08-09 (×3): qty 1

## 2014-08-09 MED ORDER — NICOTINE 21 MG/24HR TD PT24
21.0000 mg | MEDICATED_PATCH | Freq: Every day | TRANSDERMAL | Status: DC
  Administered 2014-08-10 – 2014-08-11 (×2): 21 mg via TRANSDERMAL
  Filled 2014-08-09 (×2): qty 1

## 2014-08-09 MED ORDER — GUAIFENESIN ER 600 MG PO TB12
1200.0000 mg | ORAL_TABLET | Freq: Two times a day (BID) | ORAL | Status: DC
  Administered 2014-08-10 – 2014-08-11 (×2): 1200 mg via ORAL
  Filled 2014-08-09 (×5): qty 2

## 2014-08-09 MED ORDER — ALBUTEROL SULFATE HFA 108 (90 BASE) MCG/ACT IN AERS: 2.0000 | INHALATION_SPRAY | Freq: Four times a day (QID) | RESPIRATORY_TRACT | Status: DC | PRN

## 2014-08-09 MED ORDER — ALBUTEROL SULFATE (2.5 MG/3ML) 0.083% IN NEBU: 2.5000 mg | INHALATION_SOLUTION | Freq: Four times a day (QID) | RESPIRATORY_TRACT | Status: DC

## 2014-08-09 MED ORDER — ACETAMINOPHEN 325 MG PO TABS: 650.0000 mg | ORAL_TABLET | Freq: Four times a day (QID) | ORAL | Status: DC | PRN

## 2014-08-09 MED ORDER — NICOTINE 21 MG/24HR TD PT24
21.0000 mg | MEDICATED_PATCH | Freq: Once | TRANSDERMAL | Status: AC
  Administered 2014-08-09: 21 mg via TRANSDERMAL
  Filled 2014-08-09: qty 1

## 2014-08-09 MED ORDER — OXYCODONE HCL 5 MG PO TABS
10.0000 mg | ORAL_TABLET | ORAL | Status: DC | PRN
  Administered 2014-08-10: 10 mg via ORAL
  Filled 2014-08-09: qty 2

## 2014-08-09 MED ORDER — LEVALBUTEROL HCL 0.63 MG/3ML IN NEBU
0.6300 mg | INHALATION_SOLUTION | Freq: Four times a day (QID) | RESPIRATORY_TRACT | Status: DC
  Administered 2014-08-09: 0.63 mg via RESPIRATORY_TRACT

## 2014-08-09 MED ORDER — ALPRAZOLAM 1 MG PO TABS
1.0000 mg | ORAL_TABLET | Freq: Two times a day (BID) | ORAL | Status: DC | PRN
  Administered 2014-08-09 – 2014-08-11 (×3): 1 mg via ORAL
  Filled 2014-08-09 (×4): qty 1

## 2014-08-09 MED ORDER — ONDANSETRON HCL 4 MG PO TABS: 4.0000 mg | ORAL_TABLET | Freq: Four times a day (QID) | ORAL | Status: DC | PRN

## 2014-08-09 MED ORDER — SODIUM CHLORIDE 0.9 % IV SOLN: 250.0000 mL | INTRAVENOUS | Status: DC | PRN

## 2014-08-09 MED ORDER — IPRATROPIUM-ALBUTEROL 20-100 MCG/ACT IN AERS: 1.0000 | INHALATION_SPRAY | Freq: Four times a day (QID) | RESPIRATORY_TRACT | Status: DC

## 2014-08-09 MED ORDER — MOMETASONE FURO-FORMOTEROL FUM 100-5 MCG/ACT IN AERO
2.0000 | INHALATION_SPRAY | Freq: Two times a day (BID) | RESPIRATORY_TRACT | Status: DC
  Administered 2014-08-09 – 2014-08-11 (×4): 2 via RESPIRATORY_TRACT
  Filled 2014-08-09: qty 8.8

## 2014-08-09 MED ORDER — IPRATROPIUM BROMIDE 0.02 % IN SOLN
0.5000 mg | Freq: Four times a day (QID) | RESPIRATORY_TRACT | Status: DC
  Administered 2014-08-09: 0.5 mg via RESPIRATORY_TRACT
  Filled 2014-08-09: qty 2.5

## 2014-08-09 MED ORDER — DOXAZOSIN MESYLATE 8 MG PO TABS
8.0000 mg | ORAL_TABLET | Freq: Every day | ORAL | Status: DC
  Administered 2014-08-09 – 2014-08-10 (×2): 8 mg via ORAL
  Filled 2014-08-09 (×3): qty 1

## 2014-08-09 MED ORDER — SODIUM CHLORIDE 0.9 % IJ SOLN
3.0000 mL | Freq: Two times a day (BID) | INTRAMUSCULAR | Status: DC
Start: 2014-08-09 — End: 2014-08-11

## 2014-08-09 MED ORDER — VANCOMYCIN HCL 10 G IV SOLR
1500.0000 mg | INTRAVENOUS | Status: DC
  Administered 2014-08-10: 1500 mg via INTRAVENOUS
  Filled 2014-08-09 (×2): qty 1500

## 2014-08-09 MED ORDER — HYDROMORPHONE HCL 1 MG/ML IJ SOLN
1.0000 mg | Freq: Once | INTRAMUSCULAR | Status: AC
  Administered 2014-08-09: 1 mg via INTRAVENOUS
  Filled 2014-08-09: qty 1

## 2014-08-09 MED ORDER — IPRATROPIUM-ALBUTEROL 0.5-2.5 (3) MG/3ML IN SOLN
3.0000 mL | RESPIRATORY_TRACT | Status: DC
  Administered 2014-08-09: 3 mL via RESPIRATORY_TRACT
  Filled 2014-08-09: qty 3

## 2014-08-09 MED ORDER — ASPIRIN EC 81 MG PO TBEC
81.0000 mg | DELAYED_RELEASE_TABLET | Freq: Every day | ORAL | Status: DC
  Administered 2014-08-10: 81 mg via ORAL
  Filled 2014-08-09 (×2): qty 1

## 2014-08-09 MED ORDER — HYDROMORPHONE HCL 1 MG/ML IJ SOLN
1.0000 mg | INTRAMUSCULAR | Status: DC | PRN
  Administered 2014-08-09 – 2014-08-10 (×3): 1 mg via INTRAVENOUS
  Filled 2014-08-09 (×3): qty 1

## 2014-08-09 MED ORDER — SODIUM CHLORIDE 0.9 % IJ SOLN: 3.0000 mL | INTRAMUSCULAR | Status: DC | PRN

## 2014-08-09 MED ORDER — IPRATROPIUM-ALBUTEROL 0.5-2.5 (3) MG/3ML IN SOLN
3.0000 mL | RESPIRATORY_TRACT | Status: DC
  Administered 2014-08-09: 3 mL via RESPIRATORY_TRACT
  Filled 2014-08-09 (×2): qty 3

## 2014-08-09 MED ORDER — HYDROMORPHONE HCL 2 MG PO TABS
2.0000 mg | ORAL_TABLET | ORAL | Status: DC | PRN
  Filled 2014-08-09: qty 1

## 2014-08-09 MED ORDER — VANCOMYCIN HCL IN DEXTROSE 1-5 GM/200ML-% IV SOLN
1000.0000 mg | INTRAVENOUS | Status: AC
  Administered 2014-08-09: 1000 mg via INTRAVENOUS
  Filled 2014-08-09: qty 200

## 2014-08-09 MED ORDER — SODIUM CHLORIDE 0.9 % IJ SOLN
3.0000 mL | Freq: Two times a day (BID) | INTRAMUSCULAR | Status: DC
Start: 2014-08-09 — End: 2014-08-11
  Administered 2014-08-10 – 2014-08-11 (×3): 3 mL via INTRAVENOUS

## 2014-08-09 MED ORDER — ONDANSETRON HCL 4 MG/2ML IJ SOLN: 4.0000 mg | Freq: Four times a day (QID) | INTRAMUSCULAR | Status: DC | PRN

## 2014-08-09 MED ORDER — APIXABAN 5 MG PO TABS
5.0000 mg | ORAL_TABLET | Freq: Two times a day (BID) | ORAL | Status: DC
  Administered 2014-08-09 – 2014-08-11 (×4): 5 mg via ORAL
  Filled 2014-08-09 (×5): qty 1

## 2014-08-09 MED ORDER — IPRATROPIUM-ALBUTEROL 0.5-2.5 (3) MG/3ML IN SOLN
3.0000 mL | Freq: Four times a day (QID) | RESPIRATORY_TRACT | Status: DC
  Administered 2014-08-10 – 2014-08-11 (×5): 3 mL via RESPIRATORY_TRACT
  Filled 2014-08-09 (×6): qty 3

## 2014-08-09 MED ORDER — METHYLPREDNISOLONE SODIUM SUCC 40 MG IJ SOLR
40.0000 mg | Freq: Four times a day (QID) | INTRAMUSCULAR | Status: DC
  Administered 2014-08-09 – 2014-08-10 (×5): 40 mg via INTRAVENOUS
  Filled 2014-08-09 (×8): qty 1

## 2014-08-09 MED ORDER — ALBUTEROL SULFATE (2.5 MG/3ML) 0.083% IN NEBU: 2.5000 mg | INHALATION_SOLUTION | RESPIRATORY_TRACT | Status: DC | PRN

## 2014-08-09 MED ORDER — VANCOMYCIN HCL IN DEXTROSE 1-5 GM/200ML-% IV SOLN
1000.0000 mg | Freq: Once | INTRAVENOUS | Status: AC
  Administered 2014-08-09: 1000 mg via INTRAVENOUS
  Filled 2014-08-09: qty 200

## 2014-08-09 MED ORDER — ACETAMINOPHEN 650 MG RE SUPP: 650.0000 mg | Freq: Four times a day (QID) | RECTAL | Status: DC | PRN

## 2014-08-09 MED ORDER — FUROSEMIDE 10 MG/ML IJ SOLN
40.0000 mg | Freq: Two times a day (BID) | INTRAMUSCULAR | Status: DC
  Administered 2014-08-09 – 2014-08-11 (×4): 40 mg via INTRAVENOUS
  Filled 2014-08-09 (×4): qty 4

## 2014-08-09 NOTE — ED Notes (Signed)
Venous Doppler at [facility]

## 2014-08-09 NOTE — ED Notes (Signed)
TRANSFERRED BY TERRANCE RN TO 4 WEST

## 2014-08-09 NOTE — ED Notes (Signed)
Patient requesting nicotine patch

## 2014-08-09 NOTE — ED Notes (Signed)
Breathing tx in progress.

## 2014-08-09 NOTE — ED Notes (Signed)
EMILY RN 4 WEST RN AWARE OF VANCOMYCIN 2ND DOSE NEEDING /PENDING. LABS DRAWN/RESULTS PENDING. UA RESULTS PENDING

## 2014-08-09 NOTE — ED Provider Notes (Signed)
CSN: [id number]     Arrival date & time 08/09/14  1018 History   First MD Initiated Contact with Patient 08/09/14 1024     Chief Complaint  Patient presents with  . Shortness of Breath  . COPD  . Chest Pain  . Leg Pain  . Leg Swelling     (Consider location/radiation/quality/duration/timing/severity/associated sxs/prior Treatment) HPI Comments: Had R leg femoral bypass grafting for lifestyle limiting claudication. Was discharged one week ago. SOB began last night, worsening this morning. Also is having R leg pain and redness. His R lower leg surgical incision site is leaking clear and cloudy fluid.  Patient is a 69 y.o. male presenting with shortness of breath, chest pain, and leg pain. The history is provided by the patient.  Shortness of Breath Severity:  Moderate Onset quality:  Gradual Timing:  Constant Progression:  Unchanged Chronicity:  New Context: not smoke exposure and not URI   Relieved by:  Nothing Worsened by:  Nothing tried Associated symptoms: chest pain   Associated symptoms: no abdominal pain, no cough, no fever and no vomiting   Chest Pain Associated symptoms: no abdominal pain, no cough, no fever, no shortness of breath and not vomiting   Leg Pain Associated symptoms: no fever     Past Medical History  Diagnosis Date  . CAD (coronary artery disease)     a. s/p CABG x 5;  b. 04/2012 Cath: patent LIMA->LAD and VG->OM3, all other grafts occluded. c. Cath 12/2013: patent SVG-OM3 & LIMA-LAD, known occ other VG.  Marland Kitchen Hypertension   . Adjustment disorder with anxiety   . Hyperlipidemia     a. Unable to take statins.  Marland Kitchen BPH (benign prostatic hypertrophy)   . COPD (chronic obstructive pulmonary disease)     a. on home O2.  . Arthritis   . Ischemic cardiomyopathy     a. 06/2013 Echo: EF 30-35%, no reg wma's, Gr2 DD, triv AI, mod dil LA, nl RV fxn.  . Chronic systolic CHF (congestive heart failure)     a. 06/2013 Echo: EF 30-35%. b. 11/08/13 EF 40-45%, LVH, HK of  the mid-distalanterseptal myocardium, trivial Ao regurg, calcified mitral annulus, RA mildly dilated  . Prostate cancer dx'd 2012  . Renal cancer dx'd 1997    lt nephrectomy  . CKD (chronic kidney disease), stage III   . Tobacco abuse     a. 31 yr hx as of 2015, transitioned to e-cig.  Marland Kitchen Syncope     a. 08/2013: the day following cath, no injury, had received multiple doses of Versed for anxiety - this was felt to be a contributing factor.  Marland Kitchen PAF (paroxysmal atrial fibrillation)     a. on Eliquis 5 mg bid  . Hypotension     a. H/o soft BP prohibiting ACEI/ARB use.  . Chronic pain   . PAD (peripheral artery disease)     a. 12/2013: PV angio s/p angioplasty to R external iliac artery stenosis, also has R SFA occlusion with reconstitution of above-knee popliteal artery, 2 vessel runoff via peroneal and posterior tib.b. LE doppler 02/2014 ABI right 0.52, left 0.95  . Pulmonary nodules     a. 12/2013:  small pulmonary nodules measuring up to 96mm in RM/LL, f/u recommended 6-12 months.  . Anxiety Dx 2004  . Anginal pain     none recent  . Shortness of breath dyspnea   . Rheumatic fever     child   Past Surgical History  Procedure Laterality Date  .  Coronary artery bypass graft      x 5  . Cardiac catheterization    . Nephrectomy      left nephrectomy for ca  . Posterior cervical laminectomy      x 8   limited ROM  and can't lie flat  . Heart stents      x 5  . Robot assisted laparoscopic radical prostatectomy      for prostate cancer  . Cystoscopy with litholapaxy  05/01/2012    Procedure: CYSTOSCOPY WITH LITHOLAPAXY;  Surgeon: Dutch Gray, MD;  Location: [facility];  Service: Urology;  Laterality: N/A;  . Prostate cancer    . Kidney surgery Left 1994  . Left and right heart catheterization with coronary/graft angiogram N/A 04/24/2012    Procedure: LEFT AND RIGHT HEART CATHETERIZATION WITH Beatrix Fetters;  Surgeon: Sherren Mocha, MD;  Location: [facility] CATH LAB;  Service:  Cardiovascular;  Laterality: N/A;  . Left and right heart catheterization with coronary angiogram N/A 08/21/2013    Procedure: LEFT AND RIGHT HEART CATHETERIZATION WITH CORONARY ANGIOGRAM;  Surgeon: Josue Hector, MD;  Location: [facility] CATH LAB;  Service: Cardiovascular;  Laterality: N/A;  . Lower extremity angiogram N/A 08/21/2013    Procedure: LOWER EXTREMITY ANGIOGRAM;  Surgeon: Josue Hector, MD;  Location: [facility] CATH LAB;  Service: Cardiovascular;  Laterality: N/A;  . Abdominal aortagram N/A 12/25/2013    Procedure: ABDOMINAL AORTAGRAM;  Surgeon: Serafina Mitchell, MD;  Location: [facility] CATH LAB;  Service: Cardiovascular;  Laterality: N/A;  . Left heart catheterization with coronary/graft angiogram N/A 04/19/2014    Procedure: LEFT HEART CATHETERIZATION WITH Beatrix Fetters;  Surgeon: Jettie Booze, MD;  Location: [facility] CATH LAB;  Service: Cardiovascular;  Laterality: N/A;  . Abdominal aortagram N/A 06/26/2014    Procedure: ABDOMINAL AORTAGRAM;  Surgeon: Serafina Mitchell, MD;  Location: [facility] CATH LAB;  Service: Cardiovascular;  Laterality: N/A;  . Femoral-popliteal bypass graft Right 07/26/2014    Procedure: BYPASS GRAFT FEMORAL-POPLITEAL ARTERY- right leg with PTFE;  Surgeon: Serafina Mitchell, MD;  Location: [facility];  Service: Vascular;  Laterality: Right;   Family History  Problem Relation Age of Onset  . Diabetes Mother   . Heart disease Mother   . Hypertension Mother   . Heart attack Mother   . Coronary artery disease    . Heart disease Father   . Diabetes Father   . Heart attack Father   . Heart disease Brother   . Heart attack Brother   . COPD Sister    History  Substance Use Topics  . Smoking status: Current Some Day Smoker -- 0.30 packs/day for 54 years    Types: Cigarettes    Last Attempt to Quit: 02/04/2014  . Smokeless tobacco: Former Systems developer    Quit date: 04/19/2012     Comment: Using e-cig now  smokes once in a while  . Alcohol Use: Yes     Comment: vodka cranberry occ     Review of Systems  Constitutional: Negative for fever.  Respiratory: Negative for cough and shortness of breath.   Cardiovascular: Positive for chest pain.  Gastrointestinal: Negative for vomiting and abdominal pain.  All other systems reviewed and are negative.     Allergies  Ativan; Lisinopril; Morphine and related; and Ibuprofen  Home Medications   Prior to Admission medications   Medication Sig Start Date End Date Taking? Authorizing Provider  albuterol (PROVENTIL HFA;VENTOLIN HFA) 108 (90 BASE) MCG/ACT inhaler Inhale 2 puffs into the lungs every  6 (six) hours as needed for wheezing or shortness of breath. Shortness of breath 02/27/14   Brand Males, MD  albuterol (PROVENTIL) (2.5 MG/3ML) 0.083% nebulizer solution Take 3 mLs (2.5 mg total) by nebulization 4 (four) times daily. Dx J44.1 05/31/14   Brand Males, MD  ALPRAZolam Duanne Moron) 1 MG tablet Take 1 tablet (1 mg total) by mouth 2 (two) times daily as needed for anxiety. Patient taking differently: Take 1 mg by mouth 3 (three) times daily as needed for anxiety.  05/28/14   Minerva Ends, MD  apixaban (ELIQUIS) 5 MG TABS tablet Take 1 tablet (5 mg total) by mouth 2 (two) times daily. 05/08/14   Serafina Mitchell, MD  aspirin EC 81 MG tablet Take 81 mg by mouth daily.    Historical Provider, MD  doxazosin (CARDURA) 8 MG tablet Take 1 tablet (8 mg total) by mouth at bedtime. 06/10/14   Brand Males, MD  Fluticasone Furoate-Vilanterol (BREO ELLIPTA) 100-25 MCG/INH AEPB Inhale 1 puff into the lungs every morning. Patient not taking: Reported on 07/22/2014 06/18/14   Brand Males, MD  furosemide (LASIX) 20 MG tablet Take 2 tablets (40 mg total) by mouth 2 (two) times daily. 06/27/14   Brand Males, MD  guaiFENesin (MUCINEX) 600 MG 12 hr tablet Take 2 tablets (1,200 mg total) by mouth 2 (two) times daily. Patient taking differently: Take 1,200 mg by mouth 2 (two) times daily as needed for cough.  05/17/14   Nishant  Dhungel, MD  HYDROcodone-acetaminophen (NORCO) 10-325 MG per tablet Take 1 tablet by mouth 3 (three) times daily. Patient taking differently: Take 1 tablet by mouth 5 (five) times daily.  06/27/14   Samantha J Rhyne, PA-C  HYDROmorphone (DILAUDID) 4 MG tablet Take 4 mg by mouth every 4 (four) hours as needed for moderate pain.  05/18/14   Historical Provider, MD  ipratropium (ATROVENT) 0.02 % nebulizer solution Take 0.5 mg by nebulization 4 (four) times daily.    Historical Provider, MD  Ipratropium-Albuterol (COMBIVENT RESPIMAT) 20-100 MCG/ACT AERS respimat Inhale 1 puff into the lungs 4 (four) times daily. 04/02/14   Brand Males, MD  mometasone-formoterol (DULERA) 100-5 MCG/ACT AERO Inhale 2 puffs into the lungs 2 (two) times daily. 06/10/14   Brand Males, MD  nicotine (NICODERM CQ - DOSED IN MG/24 HOURS) 21 mg/24hr patch Place 21 mg onto the skin daily.    Historical Provider, MD  nitroGLYCERIN (NITROSTAT) 0.3 MG SL tablet Place 0.3 mg under the tongue every 5 (five) minutes as needed for chest pain.    Historical Provider, MD  oxyCODONE 10 MG TABS Take 1 tablet (10 mg total) by mouth every 4 (four) hours as needed for moderate pain. 07/31/14   Ulyses Amor, PA-C  pantoprazole (PROTONIX) 40 MG tablet Take 1 tablet (40 mg total) by mouth daily at 12 noon. Patient not taking: Reported on 07/22/2014 04/20/14   Barton Dubois, MD  predniSONE (DELTASONE) 10 MG tablet Take 1 tablet (10 mg total) by mouth daily. 07/09/14   Brand Males, MD   BP 151/92 mmHg  Pulse 94  Temp(Src) 98.2 F (36.8 C) (Oral)  Resp 20  SpO2 94% Physical Exam  Constitutional: He is oriented to person, place, and time. He appears well-developed and well-nourished. No distress.  HENT:  Head: Normocephalic and atraumatic.  Mouth/Throat: Oropharynx is clear and moist. No oropharyngeal exudate.  Eyes: EOM are normal. Pupils are equal, round, and reactive to light.  Neck: Normal range of motion. Neck supple.  Cardiovascular: Normal rate and regular rhythm.  Exam reveals no friction rub.   No murmur heard. Pulmonary/Chest: Effort normal and breath sounds normal. No respiratory distress. He has no wheezes. He has no rales.  Abdominal: He exhibits no distension. There is no tenderness. There is no rebound.  Musculoskeletal: Normal range of motion. He exhibits edema (1+ bilateral, up to knees) and tenderness (R leg, with redness in lower R leg).       Legs: Surgical incision on R proximal medial lower leg, mild erythema. Purulent base, clear discharge. No dehiscence.   Neurological: He is alert and oriented to person, place, and time.  Skin: He is not diaphoretic.  Nursing note and vitals reviewed.   ED Course  Procedures (including critical care time) Labs Review Labs Reviewed  CULTURE, BLOOD (ROUTINE X 2)  CULTURE, BLOOD (ROUTINE X 2)  CBC  COMPREHENSIVE METABOLIC PANEL  I-STAT TROPOININ, ED    Imaging Review No results found.   EKG Interpretation   Date/Time:  Friday August 09 2014 10:37:50 EST Ventricular Rate:  87 PR Interval:  165 QRS Duration: 93 QT Interval:  382 QTC Calculation: 459 R Axis:   72 Text Interpretation:  Sinus rhythm Atrial premature complexes Anteroseptal  infarct, old No significant change since last tracing Confirmed by Mingo Amber   MD, Jobos [phone]) on 08/09/2014 10:44:48 AM     Author: Vilma Prader Slaughter Service: Vascular Lab Author Type: Cardiovascular Sonographer    Filed: 08/09/2014 1:25 PM Note Time: 08/09/2014 1:19 PM Status: Addendum   Editor: Vilma Prader Slaughter (Cardiovascular Sonographer)     Related Notes: Original Note by Dareen Piano (Cardiovascular Sonographer) filed at 08/09/2014 1:19 PM   Expand All Collapse All   VASCULAR LAB PRELIMINARY PRELIMINARY PRELIMINARY PRELIMINARY  Right lower extremity venous duplex completed.   Preliminary report: Right: No evidence of DVT, superficial thrombosis, or Baker's cyst.  Arterial graft appears to be patent.  SLAUGHTER, [place], RVS 08/09/2014, 1:19 PM      MDM   Final diagnoses:  SOB (shortness of breath)  Cellulitis of right leg    69 year old male with numerous medical comorbidities presents with right leg pain, redness and shortness of breath. This all began last night, worsened this morning. Right leg is swollen, similar to left. Right leg is red and warm. He has a vertical surgical incision on the medial superior tibia with mild purulent base and clear and cloudy discharge. No wound dehiscence. He has good pulses in his feet. His lungs have scattered wheezes. He received Cipro Medrol and Route and breathing treatment and Route. Here vitals are stable. He is satting in the mid 90s without option. He is on Lanoxin at night but not throughout the day. We'll check labs including blood cultures. Will will give another DuoNeb. EKG similar to prior. Vanc given for cellulitis, admitted by Dr. Allyson Sabal. Dr. Trula Slade consulted, will see patient.  Evelina Bucy, MD 08/09/14 [phone]

## 2014-08-09 NOTE — ED Notes (Signed)
Per [facility]- Pt states recent surgery to RLE stent placement last week. C/o of increased pain, swelling and drainage to rt groin, calf and foot right side. Called surgeon and was told to come to ED.Pt c/o of chest discomfort  X 3 days increased with coughing. Pt c/o of increased SOB. Has home NEB. Last NED was this AM without relief relief. EMS gave albuterol Zettie Cooley 5mg /.5mg  125mg  solumedrol IVP and Zofran 4mg  IVP in route. Pt respiratory status improved.

## 2014-08-09 NOTE — Progress Notes (Addendum)
VASCULAR LAB PRELIMINARY  PRELIMINARY  PRELIMINARY  PRELIMINARY  Right lower extremity venous duplex completed.    Preliminary report:  Right:  No evidence of DVT, superficial thrombosis, or Baker's cyst. Arterial graft appears to be patent.  Albino Bufford, RVS 08/09/2014, 1:19 PM

## 2014-08-09 NOTE — Telephone Encounter (Signed)
Spoke with pt, states he feels like "fluid is collecting in his lungs" and "water" is leaking out of his incisions on his legs and chest, having R sided chest pain X2 days, difficulty taking in a deep breath, increased fatigue.  I spoke with MR about this, per MR I advised pt to head to the E.D. I also advised pt to call his cardiologist to make them aware of what is going on with him.  Pt states he will be heading to [facility].  Nothing further needed at this time.

## 2014-08-09 NOTE — Consult Note (Signed)
Consult Note  Patient name: Lucas [name] MRN: [id number] DOB: [birth year] Sex: male  Consulting Physician:  ER  Reason for Consult:  Chief Complaint  Patient presents with  . Shortness of Breath  . COPD  . Chest Pain  . Leg Pain  . Leg Swelling    HISTORY OF PRESENT ILLNESS: 69 year old male well know to me having undergone recent right fem-BK pop bypass with gortex for severe claudication.  Post-operatively, the patient had a fair amount of swelling.  He presented to the ER today with worsening pain in his right leg as well as redness from his BK incision  No fevers.  He also has been having chest discomfort with increased coughing  Past Medical History  Diagnosis Date  . CAD (coronary artery disease)     a. s/p CABG x 5;  b. 04/2012 Cath: patent LIMA->LAD and VG->OM3, all other grafts occluded. c. Cath 12/2013: patent SVG-OM3 & LIMA-LAD, known occ other VG.  Marland Kitchen Hypertension   . Adjustment disorder with anxiety   . Hyperlipidemia     a. Unable to take statins.  Marland Kitchen BPH (benign prostatic hypertrophy)   . COPD (chronic obstructive pulmonary disease)     a. on home O2.  . Arthritis   . Ischemic cardiomyopathy     a. 06/2013 Echo: EF 30-35%, no reg wma's, Gr2 DD, triv AI, mod dil LA, nl RV fxn.  . Chronic systolic CHF (congestive heart failure)     a. 06/2013 Echo: EF 30-35%. b. 11/08/13 EF 40-45%, LVH, HK of the mid-distalanterseptal myocardium, trivial Ao regurg, calcified mitral annulus, RA mildly dilated  . Prostate cancer dx'd 2012  . Renal cancer dx'd 1997    lt nephrectomy  . CKD (chronic kidney disease), stage III   . Tobacco abuse     a. 26 yr hx as of 2015, transitioned to e-cig.  Marland Kitchen Syncope     a. 08/2013: the day following cath, no injury, had received multiple doses of Versed for anxiety - this was felt to be a contributing factor.  Marland Kitchen PAF (paroxysmal atrial fibrillation)     a. on Eliquis 5 mg bid  . Hypotension     a. H/o soft BP prohibiting ACEI/ARB  use.  . Chronic pain   . PAD (peripheral artery disease)     a. 12/2013: PV angio s/p angioplasty to R external iliac artery stenosis, also has R SFA occlusion with reconstitution of above-knee popliteal artery, 2 vessel runoff via peroneal and posterior tib.b. LE doppler 02/2014 ABI right 0.52, left 0.95  . Pulmonary nodules     a. 12/2013:  small pulmonary nodules measuring up to 31mm in RM/LL, f/u recommended 6-12 months.  . Anxiety Dx 2004  . Anginal pain     none recent  . Shortness of breath dyspnea   . Rheumatic fever     child    Past Surgical History  Procedure Laterality Date  . Coronary artery bypass graft      x 5  . Cardiac catheterization    . Nephrectomy      left nephrectomy for ca  . Posterior cervical laminectomy      x 8   limited ROM  and can't lie flat  . Heart stents      x 5  . Robot assisted laparoscopic radical prostatectomy      for prostate cancer  . Cystoscopy with litholapaxy  05/01/2012  Procedure: CYSTOSCOPY WITH LITHOLAPAXY;  Surgeon: Dutch Gray, MD;  Location: [facility];  Service: Urology;  Laterality: N/A;  . Prostate cancer    . Kidney surgery Left 1994  . Left and right heart catheterization with coronary/graft angiogram N/A 04/24/2012    Procedure: LEFT AND RIGHT HEART CATHETERIZATION WITH Beatrix Fetters;  Surgeon: Sherren Mocha, MD;  Location: [facility] CATH LAB;  Service: Cardiovascular;  Laterality: N/A;  . Left and right heart catheterization with coronary angiogram N/A 08/21/2013    Procedure: LEFT AND RIGHT HEART CATHETERIZATION WITH CORONARY ANGIOGRAM;  Surgeon: Josue Hector, MD;  Location: [facility] CATH LAB;  Service: Cardiovascular;  Laterality: N/A;  . Lower extremity angiogram N/A 08/21/2013    Procedure: LOWER EXTREMITY ANGIOGRAM;  Surgeon: Josue Hector, MD;  Location: [facility] CATH LAB;  Service: Cardiovascular;  Laterality: N/A;  . Abdominal aortagram N/A 12/25/2013    Procedure: ABDOMINAL AORTAGRAM;  Surgeon: Serafina Mitchell, MD;   Location: [facility] CATH LAB;  Service: Cardiovascular;  Laterality: N/A;  . Left heart catheterization with coronary/graft angiogram N/A 04/19/2014    Procedure: LEFT HEART CATHETERIZATION WITH Beatrix Fetters;  Surgeon: Jettie Booze, MD;  Location: [facility] CATH LAB;  Service: Cardiovascular;  Laterality: N/A;  . Abdominal aortagram N/A 06/26/2014    Procedure: ABDOMINAL AORTAGRAM;  Surgeon: Serafina Mitchell, MD;  Location: [facility] CATH LAB;  Service: Cardiovascular;  Laterality: N/A;  . Femoral-popliteal bypass graft Right 07/26/2014    Procedure: BYPASS GRAFT FEMORAL-POPLITEAL ARTERY- right leg with PTFE;  Surgeon: Serafina Mitchell, MD;  Location: [place];  Service: Vascular;  Laterality: Right;    History   Social History  . Marital Status: Widowed    Spouse Name: N/A    Number of Children: N/A  . Years of Education: N/A   Occupational History  . disabled    Social History Main Topics  . Smoking status: Current Some Day Smoker -- 0.30 packs/day for 54 years    Types: Cigarettes    Last Attempt to Quit: 02/04/2014  . Smokeless tobacco: Former Systems developer    Quit date: 04/19/2012     Comment: Using e-cig now  smokes once in a while  . Alcohol Use: Yes     Comment: vodka cranberry occ  . Drug Use: No     Comment: smokes once in a whlie  . Sexual Activity: Not Currently   Other Topics Concern  . Not on file   Social History Narrative    He lives in [place] by himself.  He is a widower.    He continues to smoke about 4 or 5  cigarettes a day but has a greater than 100 pack-year history having  previously smoked up to 3-4 packs a day over the span about 48 years.     He denies alcohol or drug use.  He is retired secondary to disability     since the 69s.     Family History  Problem Relation Age of Onset  . Diabetes Mother   . Heart disease Mother   . Hypertension Mother   . Heart attack Mother   . Coronary artery disease    . Heart disease Father   . Diabetes Father   . Heart  attack Father   . Heart disease Brother   . Heart attack Brother   . COPD Sister     Allergies as of 08/09/2014 - Review Complete 08/09/2014  Allergen Reaction Noted  . Ativan [lorazepam] Other (See Comments) 04/21/2012  .  Lisinopril Cough 08/22/2013  . Morphine and related Other (See Comments) 04/18/2012  . Ibuprofen Hives, Nausea And Vomiting, and Rash 04/17/2012    No current facility-administered medications on file prior to encounter.   Current Outpatient Prescriptions on File Prior to Encounter  Medication Sig Dispense Refill  . albuterol (PROVENTIL HFA;VENTOLIN HFA) 108 (90 BASE) MCG/ACT inhaler Inhale 2 puffs into the lungs every 6 (six) hours as needed for wheezing or shortness of breath. Shortness of breath 1 Inhaler 5  . albuterol (PROVENTIL) (2.5 MG/3ML) 0.083% nebulizer solution Take 3 mLs (2.5 mg total) by nebulization 4 (four) times daily. Dx J44.1 360 mL 5  . ALPRAZolam (XANAX) 1 MG tablet Take 1 tablet (1 mg total) by mouth 2 (two) times daily as needed for anxiety. (Patient taking differently: Take 1 mg by mouth 3 (three) times daily. ) 60 tablet 2  . apixaban (ELIQUIS) 5 MG TABS tablet Take 1 tablet (5 mg total) by mouth 2 (two) times daily. 60 tablet 1  . aspirin EC 81 MG tablet Take 81 mg by mouth daily.    Marland Kitchen doxazosin (CARDURA) 8 MG tablet Take 1 tablet (8 mg total) by mouth at bedtime. 60 tablet 0  . furosemide (LASIX) 20 MG tablet Take 2 tablets (40 mg total) by mouth 2 (two) times daily. 120 tablet 1  . HYDROcodone-acetaminophen (NORCO) 10-325 MG per tablet Take 1 tablet by mouth 3 (three) times daily. (Patient taking differently: Take 1 tablet by mouth 5 (five) times daily. ) 15 tablet 0  . HYDROmorphone (DILAUDID) 4 MG tablet Take 4 mg by mouth every 4 (four) hours as needed for moderate pain.     Marland Kitchen ipratropium (ATROVENT) 0.02 % nebulizer solution Take 0.5 mg by nebulization 4 (four) times daily.    . Ipratropium-Albuterol (COMBIVENT RESPIMAT) 20-100 MCG/ACT  AERS respimat Inhale 1 puff into the lungs 4 (four) times daily. 1 Inhaler 5  . mometasone-formoterol (DULERA) 100-5 MCG/ACT AERO Inhale 2 puffs into the lungs 2 (two) times daily. 1 Inhaler 5  . nicotine (NICODERM CQ - DOSED IN MG/24 HOURS) 21 mg/24hr patch Place 21 mg onto the skin daily.    . nitroGLYCERIN (NITROSTAT) 0.3 MG SL tablet Place 0.3 mg under the tongue every 5 (five) minutes as needed for chest pain.    Marland Kitchen oxyCODONE 10 MG TABS Take 1 tablet (10 mg total) by mouth every 4 (four) hours as needed for moderate pain. 30 tablet 0  . pantoprazole (PROTONIX) 40 MG tablet Take 1 tablet (40 mg total) by mouth daily at 12 noon. (Patient taking differently: Take 40 mg by mouth daily as needed (For heartburn or acid reflux.). ) 30 tablet 1  . predniSONE (DELTASONE) 10 MG tablet Take 1 tablet (10 mg total) by mouth daily. 30 tablet 5  . Fluticasone Furoate-Vilanterol (BREO ELLIPTA) 100-25 MCG/INH AEPB Inhale 1 puff into the lungs every morning. (Patient not taking: Reported on 07/22/2014) 1 each 6  . guaiFENesin (MUCINEX) 600 MG 12 hr tablet Take 2 tablets (1,200 mg total) by mouth 2 (two) times daily. (Patient not taking: Reported on 08/09/2014) 10 tablet 0     REVIEW OF SYSTEMS: Constitutional: Denies fever, chills, diaphoresis, appetite change and fatigue.  HEENT: Denies photophobia, eye pain, redness, hearing loss, ear pain, congestion, sore throat, rhinorrhea, sneezing, mouth sores, trouble swallowing, neck pain, neck stiffness and tinnitus.  Respiratory: positive for SOB, DOE, cough, chest tightness, and wheezing.  Cardiovascular:Positive for chest pain, palpitations and leg swelling.  Gastrointestinal: Denies  nausea, vomiting, abdominal pain, diarrhea, constipation, blood in stool and abdominal distention.  Genitourinary: Denies dysuria, urgency, frequency, hematuria, flank pain and difficulty urinating.  Musculoskeletal: Denies myalgias, back pain, joint swelling, arthralgias and gait  problem.  Skin: Denies pallor, rash and wound.  Neurological: Denies dizziness, seizures, syncope, weakness, light-headedness, numbness and headaches.  Hematological: Denies adenopathy. Easy bruising, personal or family bleeding history  Psychiatric/Behavioral: Denies suicidal ideation, mood changes, confusion, nervousness, sleep disturbance and agitation   PHYSICAL EXAMINATION: General: The patient appears their stated age.  Vital signs are BP 161/79 mmHg  Pulse 83  Temp(Src) 98.1 F (36.7 C) (Oral)  Resp 18  Ht 5\' 8"  (1.727 m)  Wt 221 lb (100.245 kg)  BMI 33.61 kg/m2  SpO2 96% Pulmonary: Respirations are non-labored HEENT:  No gross abnormalities Abdomen: Soft and non-tender  Musculoskeletal: There are no major deformities.   Neurologic: No focal weakness or paresthesias are detected, Skin: There are no ulcer or rashes noted. Psychiatric: The patient has normal affect. Cardiovascular: There is a regular rate and rhythm.  Right leg edema  Both groin and BK incision are intact and tender.  There is no drainage,  however there is erythema around the BK incision  Diagnostic Studies: U/s negative for DVT    Assessment:  Cellulitis of BK pop BPG incision Plan: Recommend broad spectrum ABx to treat his cellulitis.  Also needs leg elevation to help with edema.  He will be a candidate for compression stockings once he is further out from his surgery.  His claudication symptoms are better after revascularization.  I discussed the possibility of a graft infection, however at this time his infection appears to be superficial.  I would treat him with Abx for at least 2 weeks.  If the celulitis does not improve over night, I would add Zosyn.  I will be back to see him on Sunday     V. Leia Alf, M.D. [facility] Office: [phone] Pager:  [phone]

## 2014-08-09 NOTE — ED Notes (Signed)
Bed: WA14 Expected date:  Expected time:  Means of arrival:  Comments: EMS-SOB 

## 2014-08-09 NOTE — Progress Notes (Signed)
  CARE MANAGEMENT ED NOTE 08/09/2014  Patient:  Lucas [name], Lucas [name]   Account Number:  [id number]  Date Initiated:  08/09/2014  Documentation initiated by:  Livia Snellen  Subjective/Objective Assessment:   Patient presents to Ed with increased shortness of breath, fatigue, increased swellingand drainage to right groin, right calf and foot     Subjective/Objective Assessment Detail:   Patient 1 week post right leg femoral bypass grafting for claudication.  Patient with pmhx of CHF, COPD, BPH, HTN, prostate cancer, renal cancer, PAD, syncope, PAF     Action/Plan:   Action/Plan Detail:   Anticipated DC Date:       Status Recommendation to Physician:   Result of Recommendation:    Other ED Services  Consult Working [place]  Other    Choice offered to / List presented to:            Status of service:  Completed, signed off  ED Comments:   ED Comments Detail:  EDCM spoke to patient at bedside.  Patient reports he was supposed to follow up with the surgeon on Feb 8th and to see Dr. Adrian Blackwater on eb 7th.  Per chart review, unable to find appointment with Dr. Adrian Blackwater on Feb 7th.  Patient does not have any homehealth services at this time.  Patient lives alone and walks with a cane.  Patient reports he has a sister who lives in [place].  Patient reports he wears oxygen at night 2 liters supplied by [employer]. Patient reports he has to sleep sitting straight up in the bed to breathe better.  Patient reports he is unsure if he received a follow up phone call when he was discharged from the hospital.  This information wasplaced in readmission focus note.  No further EDCM needs at this time.

## 2014-08-09 NOTE — Progress Notes (Signed)
ANTIBIOTIC CONSULT NOTE - INITIAL  Pharmacy Consult for Vancomycin Indication: Cellulitis  Allergies  Allergen Reactions  . Ativan [Lorazepam] Other (See Comments)    Increases agitation, tolerates xanax  . Lisinopril Cough  . Morphine And Related Other (See Comments)    Hallucinations, too sedated when given with ativan  . Ibuprofen Hives, Nausea And Vomiting and Rash    Tolerates baby aspirin per patient.  There is no allergy to any dose of aspirin. Pt refuses to take 325mg  because it caused nose bleeds.    Patient Measurements: Height: 5' 8.5" (174 cm) Weight: 219 lb (99.338 kg) IBW/kg (Calculated) : 69.55 Adjusted Body Weight:   Vital Signs: Temp: 98.2 F (36.8 C) (01/29 1024) Temp Source: Oral (01/29 1024) BP: 133/65 mmHg (01/29 1430) Pulse Rate: 83 (01/29 1430) Intake/Output from previous day:   Intake/Output from this shift:    Labs:  Recent Labs  08/09/14 1054  WBC 10.2  HGB 12.4*  PLT 214  CREATININE 1.25   Estimated Creatinine Clearance: 64.3 mL/min (by C-G formula based on Cr of 1.25). No results for input(s): VANCOTROUGH, VANCOPEAK, VANCORANDOM, GENTTROUGH, GENTPEAK, GENTRANDOM, TOBRATROUGH, TOBRAPEAK, TOBRARND, AMIKACINPEAK, AMIKACINTROU, AMIKACIN in the last 72 hours.   Microbiology: Recent Results (from the past 720 hour(s))  Surgical pcr screen     Status: None   Collection Time: 07/23/14  9:39 AM  Result Value Ref Range Status   MRSA, PCR NEGATIVE NEGATIVE Final   Staphylococcus aureus NEGATIVE NEGATIVE Final    Comment:        The Xpert SA Assay (FDA approved for NASAL specimens in patients over 70 years of age), is one component of a comprehensive surveillance program.  Test performance has been validated by [employer] for patients greater than or equal to 70 year old. It is not intended to diagnose infection nor to guide or monitor treatment.     Medical History: Past Medical History  Diagnosis Date  . CAD (coronary artery  disease)     a. s/p CABG x 5;  b. 04/2012 Cath: patent LIMA->LAD and VG->OM3, all other grafts occluded. c. Cath 12/2013: patent SVG-OM3 & LIMA-LAD, known occ other VG.  Marland Kitchen Hypertension   . Adjustment disorder with anxiety   . Hyperlipidemia     a. Unable to take statins.  Marland Kitchen BPH (benign prostatic hypertrophy)   . COPD (chronic obstructive pulmonary disease)     a. on home O2.  . Arthritis   . Ischemic cardiomyopathy     a. 06/2013 Echo: EF 30-35%, no reg wma's, Gr2 DD, triv AI, mod dil LA, nl RV fxn.  . Chronic systolic CHF (congestive heart failure)     a. 06/2013 Echo: EF 30-35%. b. 11/08/13 EF 40-45%, LVH, HK of the mid-distalanterseptal myocardium, trivial Ao regurg, calcified mitral annulus, RA mildly dilated  . Prostate cancer dx'd 2012  . Renal cancer dx'd 1997    lt nephrectomy  . CKD (chronic kidney disease), stage III   . Tobacco abuse     a. 85 yr hx as of 2015, transitioned to e-cig.  Marland Kitchen Syncope     a. 08/2013: the day following cath, no injury, had received multiple doses of Versed for anxiety - this was felt to be a contributing factor.  Marland Kitchen PAF (paroxysmal atrial fibrillation)     a. on Eliquis 5 mg bid  . Hypotension     a. H/o soft BP prohibiting ACEI/ARB use.  . Chronic pain   . PAD (peripheral artery disease)  a. 12/2013: PV angio s/p angioplasty to R external iliac artery stenosis, also has R SFA occlusion with reconstitution of above-knee popliteal artery, 2 vessel runoff via peroneal and posterior tib.b. LE doppler 02/2014 ABI right 0.52, left 0.95  . Pulmonary nodules     a. 12/2013:  small pulmonary nodules measuring up to 68mm in RM/LL, f/u recommended 6-12 months.  . Anxiety Dx 2004  . Anginal pain     none recent  . Shortness of breath dyspnea   . Rheumatic fever     child   Assessment: 69 yoM with recent R leg femoral bypass grafting for lifestyle limiting claudication on 07/26/2014 presents today with right leg pain, redness, and swelling with surgical  incision leaking clear and cloudy fluid. Pharmacy consulted to start IV vancomycin for cellulitis.  First dose already given in ED.  Note weight of 99.3kg.   1/29 >> Vancomycin  >>  Tmax: Afebrile WBCs: WNL Renal: SCr 1.25 - note hx of left nephrectomy / CKD-III, appears at baseline, CrCl 64 (N57)  1/29 blood: collected 1/29 wound: collected  Goal of Therapy:  Vancomycin trough 10-15 mcg/ml  Plan: . Additional vancomycin 1g x 1 for a total loading dose of 2g, then start Vancomycin 1500mg  IV q24h F/u renal fxn, microbiology, vancomycin trough as warranted, clinical course  Ralene Bathe, PharmD, BCPS 08/09/2014, 4:33 PM  Pager: [phone]

## 2014-08-09 NOTE — H&P (Addendum)
[facility] History and Physical  EMET [name]:[id number] DOB: 04-10-1946 DOA: 08/09/2014  Referring physician:  PCP: Minerva Ends, MD   Chief Complaint: right leg erythema   HPI:  69 year old obese male with history of systolic and diastolic CHF, coronary artery disease with recent unstable angina status post cardiac cath without intervention and recommended for medical management, chronic kidney disease stage II, hypertension, COPD oxygen dependent (2L at bedtime ), A. Fib on anticoagulation , peripheral vascular disease,presents  with chest pain with shortness of breath ,cough and right LE swelling and erythema. All sx progressively getting worse since 1/21. He is s/p  BYPASS GRAFT FEMORAL-POPLITEAL ARTERY- right leg with PTFE on 1/21 by Serafina Mitchell, MD.  C/o of increased pain, swelling and drainage from right  calf and foot , increased swelling in the right leg Of note is that the patient is on eliquis .Right lower extremity venous duplex  ,No evidence of DVT, superficial thrombosis, or Baker's cyst. Arterial graft appears to be patent. Dr. Trula Slade consulted by EDP.  Has also had chest discomfort X 3 days increased with coughing.He continues to smoke and is wheezing in the ED  . Pt c/o of increased SOB not relieved by home nebs .[facility] gave albuterol Zettie Cooley 5mg /.5mg  125mg  solumedrol IVP and Zofran 4mg  IVP in route. Pt respiratory status improved. He is satting in the mid 90s    Review of Systems: negative for the following  Constitutional: Denies fever, chills, diaphoresis, appetite change and fatigue.  HEENT: Denies photophobia, eye pain, redness, hearing loss, ear pain, congestion, sore throat, rhinorrhea, sneezing, mouth sores, trouble swallowing, neck pain, neck stiffness and tinnitus.  Respiratory: positive for  SOB, DOE, cough, chest tightness, and wheezing.  Cardiovascular:Positive for chest pain, palpitations and leg swelling.  Gastrointestinal: Denies  nausea, vomiting, abdominal pain, diarrhea, constipation, blood in stool and abdominal distention.  Genitourinary: Denies dysuria, urgency, frequency, hematuria, flank pain and difficulty urinating.  Musculoskeletal: Denies myalgias, back pain, joint swelling, arthralgias and gait problem.  Skin: Denies pallor, rash and wound.  Neurological: Denies dizziness, seizures, syncope, weakness, light-headedness, numbness and headaches.  Hematological: Denies adenopathy. Easy bruising, personal or family bleeding history  Psychiatric/Behavioral: Denies suicidal ideation, mood changes, confusion, nervousness, sleep disturbance and agitation       Past Medical History  Diagnosis Date  . CAD (coronary artery disease)     a. s/p CABG x 5;  b. 04/2012 Cath: patent LIMA->LAD and VG->OM3, all other grafts occluded. c. Cath 12/2013: patent SVG-OM3 & LIMA-LAD, known occ other VG.  Marland Kitchen Hypertension   . Adjustment disorder with anxiety   . Hyperlipidemia     a. Unable to take statins.  Marland Kitchen BPH (benign prostatic hypertrophy)   . COPD (chronic obstructive pulmonary disease)     a. on home O2.  . Arthritis   . Ischemic cardiomyopathy     a. 06/2013 Echo: EF 30-35%, no reg wma's, Gr2 DD, triv AI, mod dil LA, nl RV fxn.  . Chronic systolic CHF (congestive heart failure)     a. 06/2013 Echo: EF 30-35%. b. 11/08/13 EF 40-45%, LVH, HK of the mid-distalanterseptal myocardium, trivial Ao regurg, calcified mitral annulus, RA mildly dilated  . Prostate cancer dx'd 2012  . Renal cancer dx'd 1997    lt nephrectomy  . CKD (chronic kidney disease), stage III   . Tobacco abuse     a. 62 yr hx as of 2015, transitioned to e-cig.  Marland Kitchen Syncope  a. 08/2013: the day following cath, no injury, had received multiple doses of Versed for anxiety - this was felt to be a contributing factor.  Marland Kitchen PAF (paroxysmal atrial fibrillation)     a. on Eliquis 5 mg bid  . Hypotension     a. H/o soft BP prohibiting ACEI/ARB use.  . Chronic  pain   . PAD (peripheral artery disease)     a. 12/2013: PV angio s/p angioplasty to R external iliac artery stenosis, also has R SFA occlusion with reconstitution of above-knee popliteal artery, 2 vessel runoff via peroneal and posterior tib.b. LE doppler 02/2014 ABI right 0.52, left 0.95  . Pulmonary nodules     a. 12/2013:  small pulmonary nodules measuring up to 8mm in RM/LL, f/u recommended 6-12 months.  . Anxiety Dx 2004  . Anginal pain     none recent  . Shortness of breath dyspnea   . Rheumatic fever     child     Past Surgical History  Procedure Laterality Date  . Coronary artery bypass graft      x 5  . Cardiac catheterization    . Nephrectomy      left nephrectomy for ca  . Posterior cervical laminectomy      x 8   limited ROM  and can't lie flat  . Heart stents      x 5  . Robot assisted laparoscopic radical prostatectomy      for prostate cancer  . Cystoscopy with litholapaxy  05/01/2012    Procedure: CYSTOSCOPY WITH LITHOLAPAXY;  Surgeon: Dutch Gray, MD;  Location: [facility] ORS;  Service: Urology;  Laterality: N/A;  . Prostate cancer    . Kidney surgery Left 1994  . Left and right heart catheterization with coronary/graft angiogram N/A 04/24/2012    Procedure: LEFT AND RIGHT HEART CATHETERIZATION WITH Beatrix Fetters;  Surgeon: Sherren Mocha, MD;  Location: [facility] CATH LAB;  Service: Cardiovascular;  Laterality: N/A;  . Left and right heart catheterization with coronary angiogram N/A 08/21/2013    Procedure: LEFT AND RIGHT HEART CATHETERIZATION WITH CORONARY ANGIOGRAM;  Surgeon: Josue Hector, MD;  Location[facility] C[facility] LAB;  Service: Cardiovascular;  Laterality: N/A;  . Lower extremity angiogram N/A 08/21/2013    Procedure: LOWER EXTREMITY ANGIOGRAM;  Surgeon: Josue Hector, MD;  Location: [facility] CATH LAB;  Service: Cardiovascular;  Laterality: N/A;  . Abdominal aortagram N/A 12/25/2013    Procedure: ABDOMINAL AORTAGRAM;  Surgeon: Serafina Mitchell, MD;  Location: [facility] CATH LAB;   Service: Cardiovascular;  Laterality: N/A;  . Left heart catheterization with coronary/graft angiogram N/A 04/19/2014    Procedure: LEFT HEART CATHETERIZATION WITH Beatrix Fetters;  Surgeon: Jettie Booze, MD;  Location: [facility] CATH LAB;  Service: Cardiovascular;  Laterality: N/A;  . Abdominal aortagram N/A 06/26/2014    Procedure: ABDOMINAL AORTAGRAM;  Surgeon: Serafina Mitchell, MD;  Location: [facility] CATH LAB;  Service: Cardiovascular;  Laterality: N/A;  . Femoral-popliteal bypass graft Right 07/26/2014    Procedure: BYPASS GRAFT FEMORAL-POPLITEAL ARTERY- right leg with PTFE;  Surgeon: Serafina Mitchell, MD;  Location: [place] OR;  Service: Vascular;  Laterality: Right;      Social History:  reports that he has been smoking Cigarettes.  He has a 16.2 pack-year smoking history. He quit smokeless tobacco use about 2 years ago. He reports that he drinks alcohol. He reports that he does not use illicit drugs.    Allergies  Allergen Reactions  . Ativan [Lorazepam] Other (See Comments)  Increases agitation, tolerates xanax  . Lisinopril Cough  . Morphine And Related Other (See Comments)    Hallucinations, too sedated when given with ativan  . Ibuprofen Hives, Nausea And Vomiting and Rash    Tolerates baby aspirin per patient.  There is no allergy to any dose of aspirin. Pt refuses to take 325mg  because it caused nose bleeds.    Family History  Problem Relation Age of Onset  . Diabetes Mother   . Heart disease Mother   . Hypertension Mother   . Heart attack Mother   . Coronary artery disease    . Heart disease Father   . Diabetes Father   . Heart attack Father   . Heart disease Brother   . Heart attack Brother   . COPD Sister         Prior to Admission medications   Medication Sig Start Date End Date Taking? Authorizing Provider  albuterol (PROVENTIL HFA;VENTOLIN HFA) 108 (90 BASE) MCG/ACT inhaler Inhale 2 puffs into the lungs every 6 (six) hours as needed for wheezing or  shortness of breath. Shortness of breath 02/27/14  Yes Brand Males, MD  albuterol (PROVENTIL) (2.5 MG/3ML) 0.083% nebulizer solution Take 3 mLs (2.5 mg total) by nebulization 4 (four) times daily. Dx J44.1 05/31/14  Yes Brand Males, MD  ALPRAZolam Duanne Moron) 1 MG tablet Take 1 tablet (1 mg total) by mouth 2 (two) times daily as needed for anxiety. Patient taking differently: Take 1 mg by mouth 3 (three) times daily.  05/28/14  Yes Josalyn C Funches, MD  apixaban (ELIQUIS) 5 MG TABS tablet Take 1 tablet (5 mg total) by mouth 2 (two) times daily. 05/08/14  Yes Serafina Mitchell, MD  aspirin EC 81 MG tablet Take 81 mg by mouth daily.   Yes Historical Provider, MD  doxazosin (CARDURA) 8 MG tablet Take 1 tablet (8 mg total) by mouth at bedtime. 06/10/14  Yes Brand Males, MD  furosemide (LASIX) 20 MG tablet Take 2 tablets (40 mg total) by mouth 2 (two) times daily. 06/27/14  Yes Brand Males, MD  HYDROcodone-acetaminophen (NORCO) 10-325 MG per tablet Take 1 tablet by mouth 3 (three) times daily. Patient taking differently: Take 1 tablet by mouth 5 (five) times daily.  06/27/14  Yes Samantha J Rhyne, PA-C  HYDROmorphone (DILAUDID) 4 MG tablet Take 4 mg by mouth every 4 (four) hours as needed for moderate pain.  05/18/14  Yes Historical Provider, MD  ipratropium (ATROVENT) 0.02 % nebulizer solution Take 0.5 mg by nebulization 4 (four) times daily.   Yes Historical Provider, MD  Ipratropium-Albuterol (COMBIVENT RESPIMAT) 20-100 MCG/ACT AERS respimat Inhale 1 puff into the lungs 4 (four) times daily. 04/02/14  Yes Brand Males, MD  mometasone-formoterol (DULERA) 100-5 MCG/ACT AERO Inhale 2 puffs into the lungs 2 (two) times daily. 06/10/14  Yes Brand Males, MD  nicotine (NICODERM CQ - DOSED IN MG/24 HOURS) 21 mg/24hr patch Place 21 mg onto the skin daily.   Yes Historical Provider, MD  nitroGLYCERIN (NITROSTAT) 0.3 MG SL tablet Place 0.3 mg under the tongue every 5 (five) minutes as needed  for chest pain.   Yes Historical Provider, MD  oxyCODONE 10 MG TABS Take 1 tablet (10 mg total) by mouth every 4 (four) hours as needed for moderate pain. 07/31/14  Yes Ulyses Amor, PA-C  pantoprazole (PROTONIX) 40 MG tablet Take 1 tablet (40 mg total) by mouth daily at 12 noon. Patient taking differently: Take 40 mg by mouth daily as needed (  For heartburn or acid reflux.).  04/20/14  Yes Barton Dubois, MD  predniSONE (DELTASONE) 10 MG tablet Take 1 tablet (10 mg total) by mouth daily. 07/09/14  Yes Brand Males, MD  Fluticasone Furoate-Vilanterol (BREO ELLIPTA) 100-25 MCG/INH AEPB Inhale 1 puff into the lungs every morning. Patient not taking: Reported on 07/22/2014 06/18/14   Brand Males, MD  guaiFENesin (MUCINEX) 600 MG 12 hr tablet Take 2 tablets (1,200 mg total) by mouth 2 (two) times daily. Patient not taking: Reported on 08/09/2014 05/17/14   Louellen Molder, MD     Physical Exam: Filed Vitals:   08/09/14 1430 08/09/14 1617 08/09/14 1653 08/09/14 1720  BP: 133/65  140/83 161/79  Pulse: 83  84 83  Temp:   98.3 F (36.8 C) 98.1 F (36.7 C)  TempSrc:   Oral Oral  Resp: 17  18   Height:    5\' 8"  (1.727 m)  Weight:    100.245 kg (221 lb)  SpO2: 93% 97% 98% 96%     Constitutional: Vital signs reviewed. Patient is a well-developed and well-nourished in no acute distress and cooperative with exam. Alert and oriented x3.  Head: Normocephalic and atraumatic  Ear: TM normal bilaterally  Mouth: no erythema or exudates, MMM  Eyes: PERRL, EOMI, conjunctivae normal, No scleral icterus.  Neck: Supple, Trachea midline normal ROM, No JVD, mass, thyromegaly, or carotid bruit present.  Cardiovascular: RRR, S1 normal, S2 normal, no MRG, pulses symmetric and intact bilaterally  Pulmonary/Chest: His lungs have scattered wheezes, rales, or rhonchi  Abdominal: Soft. Non-tender, non-distended, bowel sounds are normal, no masses, organomegaly, or guarding present.  GU: no CVA  tenderness Musculoskeletal: Right leg is swollen, similar to left. Right leg is red and warm. He has a vertical surgical incision on the medial superior tibia with mild purulent base and clear and cloudy discharge. No wound dehiscence. He has good pulses in his feet.r Ext: no edema and no cyanosis, pulses palpable bilaterally (DP and PT)  Hematology: no cervical, inginal, or axillary adenopathy.  Neurological: A&O x3, Strenght is normal and symmetric bilaterally, cranial nerve II-XII are grossly intact, no focal motor deficit, sensory intact to light touch bilaterally.  Skin: Warm, dry and intact. No rash, cyanosis, or clubbing.  Psychiatric: Normal mood and affect. speech and behavior is normal. Judgment and thought content normal. Cognition and memory are normal.       Labs on Admission:    Basic Metabolic Panel:  Recent Labs Lab 08/09/14 1054  NA 142  K 3.8  CL 106  CO2 25  GLUCOSE 130*  BUN 25*  CREATININE 1.25  CALCIUM 9.4   Liver Function Tests:  Recent Labs Lab 08/09/14 1054  AST 25  ALT 23  ALKPHOS 59  BILITOT 0.6  PROT 7.4  ALBUMIN 4.0   No results for input(s): LIPASE, AMYLASE in the last 168 hours. No results for input(s): AMMONIA in the last 168 hours. CBC:  Recent Labs Lab 08/09/14 1054  WBC 10.2  HGB 12.4*  HCT 39.4  MCV 92.7  PLT 214   Cardiac Enzymes: No results for input(s): CKTOTAL, CKMB, CKMBINDEX, TROPONINI in the last 168 hours.  BNP (last 3 results)  Recent Labs  03/20/14 0906 04/17/14 0049 05/13/14 2140  PROBNP 185.0* 1368.0* 697.9*      CBG: No results for input(s): GLUCAP in the last 168 hours.  Radiological Exams on Admission: Dg Chest 2 View  08/09/2014   CLINICAL DATA:  Shortness of breath, chest discomfort for 3  days.  EXAM: CHEST  2 VIEW  COMPARISON:  July 30, 2014.  FINDINGS: Stable cardiomediastinal silhouette. Status post coronary artery bypass graft. No pneumothorax or pleural effusion is noted. No acute  pulmonary disease is noted. Bony thorax is intact.  IMPRESSION: No acute cardiopulmonary abnormality seen.   Electronically Signed   By: Sabino Dick M.D.   On: 08/09/2014 11:35    EKG: Independently reviewed.    Assessment/Plan  Cellulitis right LE  Started on vancomycin Blood culture times 2  R/o for DVT    SMOKER-nicotine cessation counseling done, nicotine patch     Essential hypertension- continue cardura ,if Bp stable    Obesity,  OSA (obstructive sleep apnea)-nocturnal o2 on 2l at home     PAD (peripheral artery disease)-s/p fem pop,we have consulted Dr Trula Slade     Acute COPD, exacerbation, started on iv solumedrol ,nebs , mucinex, dulera , CXR negative     Chronic kidney disease, stage 2, mildly decreased GFR, monitor closely while patient is being diuresed     CAD (coronary artery disease) of artery bypass graft- recent cath, med mx recommended  On eliquis , ASA , cycle cardiac enzymes      Chronic pain , cont PO dilaudid    Acute on chronic systolic and diastolic CHF - Ef on new 2 D ECHO 45% with grade II diastolic dysfunction - continue to monitor daily weights, strict I's and O's Start  Lasix IV 40 mg   Code Status:   full Family Communication: bedside Disposition Plan: admit   Time spent: 70 mins   [facility] Pager [phone]  If 7PM-7AM, please contact night-coverage [email or web address] Password [facility] 08/09/2014, 5:31 PM

## 2014-08-09 NOTE — ED Notes (Signed)
MD at bedside. EDP AT [facility]

## 2014-08-10 DIAGNOSIS — R0600 Dyspnea, unspecified: Secondary | ICD-10-CM

## 2014-08-10 DIAGNOSIS — I25701 Atherosclerosis of coronary artery bypass graft(s), unspecified, with angina pectoris with documented spasm: Secondary | ICD-10-CM

## 2014-08-10 LAB — HEMOGLOBIN A1C
Hgb A1c MFr Bld: 6.1 % — ABNORMAL HIGH (ref 4.8–5.6)
Mean Plasma Glucose: 128 mg/dL

## 2014-08-10 LAB — TROPONIN I: Troponin I: 0.03 ng/mL (ref <0.031)

## 2014-08-10 LAB — COMPREHENSIVE METABOLIC PANEL
ALK PHOS: 54 U/L (ref 39–117)
ALT: 22 U/L (ref 0–53)
ANION GAP: 9 (ref 5–15)
AST: 23 U/L (ref 0–37)
Albumin: 4.2 g/dL (ref 3.5–5.2)
BILIRUBIN TOTAL: 0.7 mg/dL (ref 0.3–1.2)
BUN: 36 mg/dL — ABNORMAL HIGH (ref 6–23)
CHLORIDE: 101 mmol/L (ref 96–112)
CO2: 26 mmol/L (ref 19–32)
Calcium: 9.2 mg/dL (ref 8.4–10.5)
Creatinine, Ser: 1.34 mg/dL (ref 0.50–1.35)
GFR calc Af Amer: 61 mL/min — ABNORMAL LOW (ref >90)
GFR, EST NON AFRICAN AMERICAN: 52 mL/min — AB (ref >90)
GLUCOSE: 173 mg/dL — AB (ref 70–99)
Potassium: 4.4 mmol/L (ref 3.5–5.1)
SODIUM: 136 mmol/L (ref 135–145)
TOTAL PROTEIN: 7.1 g/dL (ref 6.0–8.3)

## 2014-08-10 LAB — GLUCOSE, CAPILLARY
GLUCOSE-CAPILLARY: 185 mg/dL — AB (ref 70–99)
Glucose-Capillary: 163 mg/dL — ABNORMAL HIGH (ref 70–99)
Glucose-Capillary: 153 mg/dL — ABNORMAL HIGH (ref 70–99)
Glucose-Capillary: 234 mg/dL — ABNORMAL HIGH (ref 70–99)
Glucose-Capillary: 204 mg/dL — ABNORMAL HIGH (ref 70–99)

## 2014-08-10 LAB — CBC
HCT: 36.7 % — ABNORMAL LOW (ref 39.0–52.0)
Hemoglobin: 11.2 g/dL — ABNORMAL LOW (ref 13.0–17.0)
MCH: 28.3 pg (ref 26.0–34.0)
MCHC: 30.5 g/dL (ref 30.0–36.0)
MCV: 92.7 fL (ref 78.0–100.0)
Platelets: 221 10*3/uL (ref 150–400)
RBC: 3.96 MIL/uL — AB (ref 4.22–5.81)
RDW: 16.1 % — AB (ref 11.5–15.5)
WBC: 9 10*3/uL (ref 4.0–10.5)

## 2014-08-10 MED ORDER — HYDROCODONE-ACETAMINOPHEN 10-325 MG PO TABS
1.0000 | ORAL_TABLET | ORAL | Status: DC | PRN
  Administered 2014-08-10 – 2014-08-11 (×4): 1 via ORAL
  Filled 2014-08-10 (×4): qty 1

## 2014-08-10 MED ORDER — PIPERACILLIN-TAZOBACTAM 3.375 G IVPB 30 MIN
3.3750 g | Freq: Once | INTRAVENOUS | Status: AC
  Administered 2014-08-10: 3.375 g via INTRAVENOUS
  Filled 2014-08-10: qty 50

## 2014-08-10 MED ORDER — OXYCODONE HCL 5 MG PO TABS: 10.0000 mg | ORAL_TABLET | ORAL | Status: DC | PRN

## 2014-08-10 MED ORDER — METHYLPREDNISOLONE SODIUM SUCC 40 MG IJ SOLR
40.0000 mg | Freq: Two times a day (BID) | INTRAMUSCULAR | Status: DC
  Administered 2014-08-11: 40 mg via INTRAVENOUS
  Filled 2014-08-10 (×3): qty 1

## 2014-08-10 MED ORDER — HYDROMORPHONE HCL 4 MG PO TABS
4.0000 mg | ORAL_TABLET | ORAL | Status: DC | PRN
  Administered 2014-08-10 – 2014-08-11 (×5): 4 mg via ORAL
  Filled 2014-08-10 (×5): qty 1

## 2014-08-10 MED ORDER — HYDROMORPHONE HCL 4 MG PO TABS
4.0000 mg | ORAL_TABLET | ORAL | Status: DC | PRN
Start: 2014-08-10 — End: 2014-08-10

## 2014-08-10 MED ORDER — PIPERACILLIN-TAZOBACTAM 3.375 G IVPB
3.3750 g | Freq: Three times a day (TID) | INTRAVENOUS | Status: DC
  Administered 2014-08-11: 3.375 g via INTRAVENOUS
  Filled 2014-08-10 (×2): qty 50

## 2014-08-10 MED ORDER — HYDROMORPHONE HCL 1 MG/ML IJ SOLN: 1.0000 mg | INTRAMUSCULAR | Status: DC | PRN

## 2014-08-10 NOTE — Progress Notes (Signed)
ANTIBIOTIC CONSULT NOTE - INITIAL  Pharmacy Consult for Zosyn Indication: Cellulitis  Allergies  Allergen Reactions  . Ativan [Lorazepam] Other (See Comments)    Increases agitation, tolerates xanax  . Lisinopril Cough  . Morphine And Related Other (See Comments)    Hallucinations, too sedated when given with ativan  . Ibuprofen Hives, Nausea And Vomiting and Rash    Tolerates baby aspirin per patient.  There is no allergy to any dose of aspirin. Pt refuses to take 325mg  because it caused nose bleeds.    Patient Measurements: Height: 5\' 8"  (172.7 cm) Weight: 222 lb 8 oz (100.925 kg) IBW/kg (Calculated) : 68.4 Adjusted Body Weight:   Vital Signs: Temp: 98.1 F (36.7 C) (01/30 1405) Temp Source: Oral (01/30 1405) BP: 125/52 mmHg (01/30 1405) Pulse Rate: 74 (01/30 1405) Intake/Output from previous day: 01/29 0701 - 01/30 0700 In: 800 [P.O.:600; IV Piggyback:200] Out: 950 [Urine:950] Intake/Output from this shift:    Labs:  Recent Labs  08/09/14 1054 08/10/14 0420  WBC 10.2 9.0  HGB 12.4* 11.2*  PLT 214 221  CREATININE 1.25 1.34   Estimated Creatinine Clearance: 59.9 mL/min (by C-G formula based on Cr of 1.34). No results for input(s): VANCOTROUGH, VANCOPEAK, VANCORANDOM, GENTTROUGH, GENTPEAK, GENTRANDOM, TOBRATROUGH, TOBRAPEAK, TOBRARND, AMIKACINPEAK, AMIKACINTROU, AMIKACIN in the last 72 hours.   Microbiology: Recent Results (from the past 720 hour(s))  Surgical pcr screen     Status: None   Collection Time: 07/23/14  9:39 AM  Result Value Ref Range Status   MRSA, PCR NEGATIVE NEGATIVE Final   Staphylococcus aureus NEGATIVE NEGATIVE Final    Comment:        The Xpert SA Assay (FDA approved for NASAL specimens in patients over 64 years of age), is one component of a comprehensive surveillance program.  Test performance has been validated by [employer] for patients greater than or equal to 84 year old. It is not intended to diagnose infection nor  to guide or monitor treatment.   Blood culture (routine x 2)     Status: None (Preliminary result)   Collection Time: 08/09/14 10:54 AM  Result Value Ref Range Status   Specimen Description BLOOD RIGHT HAND  Final   Special Requests BOTTLES DRAWN AEROBIC AND ANAEROBIC 5ML  Final   Culture   Final           BLOOD CULTURE RECEIVED NO GROWTH TO DATE CULTURE WILL BE HELD FOR 5 DAYS BEFORE ISSUING A FINAL NEGATIVE REPORT Performed at [facility]    Report Status PENDING  Incomplete  Blood culture (routine x 2)     Status: None (Preliminary result)   Collection Time: 08/09/14 10:55 AM  Result Value Ref Range Status   Specimen Description BLOOD LEFT FOREARM  Final   Special Requests BOTTLES DRAWN AEROBIC AND ANAEROBIC 5ML  Final   Culture   Final           BLOOD CULTURE RECEIVED NO GROWTH TO DATE CULTURE WILL BE HELD FOR 5 DAYS BEFORE ISSUING A FINAL NEGATIVE REPORT Performed at [facility]    Report Status PENDING  Incomplete  Wound culture     Status: None (Preliminary result)   Collection Time: 08/09/14 11:03 AM  Result Value Ref Range Status   Specimen Description WOUND RIGHT LEG CALF  Final   Special Requests NONE  Final   Gram Stain PENDING  Incomplete   Culture   Final    Culture reincubated for better growth Performed at  [facility]    Report Status PENDING  Incomplete    Medical History: Past Medical History  Diagnosis Date  . CAD (coronary artery disease)     a. s/p CABG x 5;  b. 04/2012 Cath: patent LIMA->LAD and VG->OM3, all other grafts occluded. c. Cath 12/2013: patent SVG-OM3 & LIMA-LAD, known occ other VG.  Marland Kitchen Hypertension   . Adjustment disorder with anxiety   . Hyperlipidemia     a. Unable to take statins.  Marland Kitchen BPH (benign prostatic hypertrophy)   . COPD (chronic obstructive pulmonary disease)     a. on home O2.  . Arthritis   . Ischemic cardiomyopathy     a. 06/2013 Echo: EF 30-35%, no reg wma's, Gr2 DD, triv AI, mod dil LA, nl RV  fxn.  . Chronic systolic CHF (congestive heart failure)     a. 06/2013 Echo: EF 30-35%. b. 11/08/13 EF 40-45%, LVH, HK of the mid-distalanterseptal myocardium, trivial Ao regurg, calcified mitral annulus, RA mildly dilated  . Prostate cancer dx'd 2012  . Renal cancer dx'd 1997    lt nephrectomy  . CKD (chronic kidney disease), stage III   . Tobacco abuse     a. 1 yr hx as of 2015, transitioned to e-cig.  Marland Kitchen Syncope     a. 08/2013: the day following cath, no injury, had received multiple doses of Versed for anxiety - this was felt to be a contributing factor.  Marland Kitchen PAF (paroxysmal atrial fibrillation)     a. on Eliquis 5 mg bid  . Hypotension     a. H/o soft BP prohibiting ACEI/ARB use.  . Chronic pain   . PAD (peripheral artery disease)     a. 12/2013: PV angio s/p angioplasty to R external iliac artery stenosis, also has R SFA occlusion with reconstitution of above-knee popliteal artery, 2 vessel runoff via peroneal and posterior tib.b. LE doppler 02/2014 ABI right 0.52, left 0.95  . Pulmonary nodules     a. 12/2013:  small pulmonary nodules measuring up to 15mm in RM/LL, f/u recommended 6-12 months.  . Anxiety Dx 2004  . Anginal pain     none recent  . Shortness of breath dyspnea   . Rheumatic fever     child   Assessment: 69 yoM with recent R leg femoral bypass grafting for lifestyle limiting claudication on 07/26/2014 presents today with right leg pain, redness, and swelling with surgical incision leaking clear and cloudy fluid. Pharmacy consulted to start IV vancomycin for cellulitis. Zosyn added 1/30.     Note weight of 99.3kg.   1/29 >> Vancomycin  >> 1/30 >> Zosyn >>  Tmax: Afebrile WBCs: WNL Renal: SCr 1.25 - note hx of left nephrectomy / CKD-III, appears at baseline, CrCl 64 (N57)  1/29 blood: collected 1/29 wound: collected  Goal of Therapy:  Treat infection  Plan: .  Zosyn 3.375 Gm IV q8h EI  F/u renal fxn, microbiology, clinical course  Dorrene German 08/10/2014, 7:06 PM

## 2014-08-10 NOTE — Progress Notes (Addendum)
Patient ID: Lucas [name], male   DOB: 05-31-1946, 69 y.o.   MRN: [id number]  [facility] PROGRESS NOTE  Lucas [name] GGY:[id number] DOB: 11-30-1945 DOA: 08/09/2014 PCP: Minerva Ends, MD   Brief narrative:    69 year old obese male with history of systolic and diastolic CHF, coronary artery disease with recent unstable angina status post cardiac cath without intervention and recommended for medical management, chronic kidney disease stage II, hypertension, COPD oxygen dependent (2L at bedtime), A. Fib on anticoagulation , peripheral vascular disease, presented with chest pain with shortness of breath, cough and right LE swelling and erythema. All sx progressively getting worse since 1/21. He is s/p BYPASS GRAFT FEMORAL-POPLITEAL ARTERY- right leg with PTFE on 1/21 by Dr. Trula Slade.  Assessment/Plan:    Active Problems: Cellulitis of BK incision - continue Vancomycin and will add Zosyn  - appreciate Dr. Stephens Shire assistance  - will need at least 2 weeks of ABX  - encouraged keep extremity elevated  - continue analgesia as needed    Acute on chronic systolic and diastolic CHF - Ef on new 2 D ECHO 45% with grade II diastolic dysfunction - weight today is 100.9 kg - continue to monitor daily weights, strict I's and O's - continue Lasix IV for now and readjust the regimen as clinically indicated    Essential hypertension - reasonable inpatient control    Obesity, BMI > 40    Chronic respiratory failure secondary to OSA, COPD  - stable respiratory status  - continue BD's scheduled and as needed - continue Solumedrol and plan on tapering    PAD (peripheral artery disease) - continuing apixaban and aspirin    Chronic kidney disease, stage 2, mildly decreased GFR - Cr is now stable and WNL   Code Status: Full.  Family Communication:  plan of care discussed with the patient Disposition Plan: Home when stable.   IV access:  Peripheral IV  Procedures and diagnostic  studies:    Dg Chest 1 View  07/30/2014   Atelectatic change right base. No edema or consolidation. Underlying emphysema.   Dg Chest 2 View  08/09/2014   No acute cardiopulmonary abnormality seen.    Medical Consultants:  Vascular surgery   Other Consultants:  None  IAnti-Infectives:   Vancomycin 1/29 -->  Faye Ramsay, MD  [facility] Pager [phone]  If 7PM-7AM, please contact night-coverage [email or web address] Password [facility] 08/10/2014, 6:48 PM   LOS: 1 day   HPI/Subjective: No events overnight.   Objective: Filed Vitals:   08/10/14 0858 08/10/14 1221 08/10/14 1405 08/10/14 1649  BP:   125/52   Pulse:   74   Temp:   98.1 F (36.7 C)   TempSrc:   Oral   Resp:   18   Height:      Weight:      SpO2: 90% 92% 93% 95%    Intake/Output Summary (Last 24 hours) at 08/10/14 1848 Last data filed at 08/10/14 1800  Gross per 24 hour  Intake   1340 ml  Output   1945 ml  Net   -605 ml    Exam:   General:  Pt is alert, follows commands appropriately, not in acute distress  Cardiovascular: Regular rate and rhythm, S1/S2, no murmurs, no rubs, no gallops  Respiratory: Clear to auscultation bilaterally, no wheezing, bibasilar crackles   Abdomen: Soft, non tender, non distended, bowel sounds present, no guarding  Extremities: Both groin and BK incision are intact and tender to palpation, no drainage,surrounding erythema  Data Reviewed: Basic Metabolic Panel:  Recent Labs Lab 08/09/14 1054 08/09/14 1638 08/10/14 0420  NA 142  --  136  K 3.8  --  4.4  CL 106  --  101  CO2 25  --  26  GLUCOSE 130*  --  173*  BUN 25*  --  36*  CREATININE 1.25  --  1.34  CALCIUM 9.4  --  9.2  MG  --  2.1  --    Liver Function Tests:  Recent Labs Lab 08/09/14 1054 08/09/14 1638 08/10/14 0420  AST 25 29 23   ALT 23 25 22   ALKPHOS 59 62 54  BILITOT 0.6 0.6 0.7  PROT 7.4 7.6 7.1  ALBUMIN 4.0 4.1 4.2   CBC:  Recent Labs Lab 08/09/14 1054 08/10/14 0420  WBC 10.2 9.0  HGB  12.4* 11.2*  HCT 39.4 36.7*  MCV 92.7 92.7  PLT 214 221   Cardiac Enzymes:  Recent Labs Lab 08/09/14 1638 08/09/14 2217 08/10/14 0420  TROPONINI 0.04* 0.03 0.03   CBG:  Recent Labs Lab 08/09/14 1730 08/09/14 2230 08/10/14 0746 08/10/14 1150 08/10/14 1639  GLUCAP 185* 231* 163* 153* 234*    Recent Results (from the past 240 hour(s))  Blood culture (routine x 2)     Status: None (Preliminary result)   Collection Time: 08/09/14 10:54 AM  Result Value Ref Range Status   Specimen Description BLOOD RIGHT HAND  Final   Special Requests BOTTLES DRAWN AEROBIC AND ANAEROBIC 5ML  Final   Culture   Final           BLOOD CULTURE RECEIVED NO GROWTH TO DATE CULTURE WILL BE HELD FOR 5 DAYS BEFORE ISSUING A FINAL NEGATIVE REPORT Performed at [facility]    Report Status PENDING  Incomplete  Blood culture (routine x 2)     Status: None (Preliminary result)   Collection Time: 08/09/14 10:55 AM  Result Value Ref Range Status   Specimen Description BLOOD LEFT FOREARM  Final   Special Requests BOTTLES DRAWN AEROBIC AND ANAEROBIC 5ML  Final   Culture   Final           BLOOD CULTURE RECEIVED NO GROWTH TO DATE CULTURE WILL BE HELD FOR 5 DAYS BEFORE ISSUING A FINAL NEGATIVE REPORT Performed at [facility]    Report Status PENDING  Incomplete  Wound culture     Status: None (Preliminary result)   Collection Time: 08/09/14 11:03 AM  Result Value Ref Range Status   Specimen Description WOUND RIGHT LEG CALF  Final   Special Requests NONE  Final   Gram Stain PENDING  Incomplete   Culture   Final    Culture reincubated for better growth Performed at [facility]    Report Status PENDING  Incomplete     Scheduled Meds: . apixaban  5 mg Oral BID  . aspirin EC  81 mg Oral Daily  . doxazosin  8 mg Oral Daily  . furosemide  40 mg Intravenous Q12H  . guaiFENesin  1,200 mg Oral BID  . insulin aspart  0-9 Units Subcutaneous TID WC  . ipratropium-albuterol  3 mL  Nebulization QID  . methylPREDNISolone   40 mg Intravenous Q6H  . mometasone-formoterol  2 puff Inhalation BID  . nicotine  21 mg Transdermal Daily  . pantoprazole  40 mg Oral Q1200  . vancomycin  1,500 mg Intravenous Q24H   Continuous Infusions:

## 2014-08-10 NOTE — Progress Notes (Signed)
  Echocardiogram 2D Echocardiogram has been performed.  Lysle Rubens 08/10/2014, 3:57 PM

## 2014-08-11 LAB — GLUCOSE, CAPILLARY
GLUCOSE-CAPILLARY: 207 mg/dL — AB (ref 70–99)
Glucose-Capillary: 153 mg/dL — ABNORMAL HIGH (ref 70–99)

## 2014-08-11 LAB — BASIC METABOLIC PANEL
Anion gap: 11 (ref 5–15)
BUN: 44 mg/dL — AB (ref 6–23)
CO2: 27 mmol/L (ref 19–32)
Calcium: 9.3 mg/dL (ref 8.4–10.5)
Chloride: 98 mmol/L (ref 96–112)
Creatinine, Ser: 1.56 mg/dL — ABNORMAL HIGH (ref 0.50–1.35)
GFR, EST AFRICAN AMERICAN: 51 mL/min — AB (ref >90)
GFR, EST NON AFRICAN AMERICAN: 44 mL/min — AB (ref >90)
Glucose, Bld: 168 mg/dL — ABNORMAL HIGH (ref 70–99)
Potassium: 4.3 mmol/L (ref 3.5–5.1)
SODIUM: 136 mmol/L (ref 135–145)

## 2014-08-11 LAB — CBC
HEMATOCRIT: 37 % — AB (ref 39.0–52.0)
Hemoglobin: 11.3 g/dL — ABNORMAL LOW (ref 13.0–17.0)
MCH: 28.3 pg (ref 26.0–34.0)
MCHC: 30.5 g/dL (ref 30.0–36.0)
MCV: 92.7 fL (ref 78.0–100.0)
Platelets: 214 10*3/uL (ref 150–400)
RBC: 3.99 MIL/uL — ABNORMAL LOW (ref 4.22–5.81)
RDW: 16 % — ABNORMAL HIGH (ref 11.5–15.5)
WBC: 12.9 10*3/uL — AB (ref 4.0–10.5)

## 2014-08-11 MED ORDER — HYDROMORPHONE HCL 4 MG PO TABS: 4.0000 mg | ORAL_TABLET | ORAL | Status: DC | PRN

## 2014-08-11 MED ORDER — ALPRAZOLAM 1 MG PO TABS
1.0000 mg | ORAL_TABLET | Freq: Three times a day (TID) | ORAL | Status: DC
Start: 2014-08-11 — End: 2014-10-11

## 2014-08-11 MED ORDER — FUROSEMIDE 40 MG PO TABS: 40.0000 mg | ORAL_TABLET | Freq: Two times a day (BID) | ORAL | Status: AC

## 2014-08-11 MED ORDER — MOXIFLOXACIN HCL 400 MG PO TABS: 400.0000 mg | ORAL_TABLET | Freq: Every day | ORAL | Status: DC

## 2014-08-11 MED ORDER — PREDNISONE 10 MG PO TABS: ORAL_TABLET | ORAL | Status: DC

## 2014-08-11 NOTE — Discharge Instructions (Signed)

## 2014-08-11 NOTE — Discharge Summary (Signed)
Physician Discharge Summary  Lucas [name] ENI:[id number] DOB: 28-Sep-1945 DOA: 08/09/2014  PCP: Minerva Ends, MD  Admit date: 08/09/2014 Discharge date: 08/11/2014  Recommendations for Outpatient Follow-up:  1. Pt will need to follow up with PCP in 2-3 weeks post discharge 2. Please obtain BMP to evaluate electrolytes and kidney function 3. Please also check CBC to evaluate Hg and Hct levels 4. Please note that pt discharged on Lasix 40 mg PO BID, weight on discharge is 102 kg, needs to be followed closely 5. Pt also discharged on Avelox per recommendations of Dr. Trula Slade, to continue ABX for at least two weeks post discharge with close follow up  Discharge Diagnoses:  Active Problems:   Cellulitis  Discharge Condition: Stable  Diet recommendation: Heart healthy diet discussed in details   Brief narrative:    69 year old obese male with history of systolic and diastolic CHF, coronary artery disease with recent unstable angina status post cardiac cath without intervention and recommended for medical management, chronic kidney disease stage II, hypertension, COPD oxygen dependent (2L at bedtime), A. Fib on anticoagulation , peripheral vascular disease, presented with chest pain with shortness of breath, cough and right LE swelling and erythema. All sx progressively getting worse since 1/21. He is s/p BYPASS GRAFT FEMORAL-POPLITEAL ARTERY- right leg with PTFE on 1/21 by Dr. Trula Slade.  Assessment/Plan:    Active Problems: Cellulitis of BK incision - continued Vancomycin and added Zosyn  - appreciate Dr. Stephens Shire assistance  - will need at least 2 weeks of ABX and per recommendations, will place on Avelox  - continue analgesia as needed   Acute on chronic systolic and diastolic CHF - Ef on new 2 D ECHO 45% with grade II diastolic dysfunction - weight trend since admission: 100.9 kg --> 102 kg - continue Lasix 40 mg PI BID upon discharge and monitor weight closely    Essential hypertension - reasonable inpatient control   Obesity, BMI > 40   Chronic respiratory failure secondary to OSA, COPD  - stable respiratory status  - continue BD's scheduled and as needed - continued solumedrol and discharged on Prednisone tapering   PAD (peripheral artery disease) - continuing apixaban and aspirin   Chronic kidney disease, stage 2, mildly decreased GFR - Cr up over the past 24 hours and likely from Lasix IV, will transition to PO Lasix upon discharge  - will need BMP upon follow up   Code Status: Full.  Family Communication: plan of care discussed with the patient Disposition Plan: Home   IV access:  Peripheral IV  Procedures and diagnostic studies:   Dg Chest 1 View 07/30/2014 Atelectatic change right base. No edema or consolidation. Underlying emphysema.   Dg Chest 2 View 08/09/2014 No acute cardiopulmonary abnormality seen.  Medical Consultants:  Vascular surgery   Other Consultants:  None  IAnti-Infectives:   Vancomycin 1/29 while inpatient   Zosyn 1/30 while inpatient  Avelox upon discharge   Discharge Exam: Filed Vitals:   08/11/14 0541  BP: 152/90  Pulse: 73  Temp: 97.8 F (36.6 C)  Resp: 18   Filed Vitals:   08/10/14 2102 08/11/14 0500 08/11/14 0541 08/11/14 0813  BP: 127/78  152/90   Pulse: 60  73   Temp: 97.7 F (36.5 C)  97.8 F (36.6 C)   TempSrc: Oral  Oral   Resp: 18  18   Height:      Weight:  102.694 kg (226 lb 6.4 oz)    SpO2: 93%  94%  89%    General: Pt is alert, follows commands appropriately, not in acute distress Cardiovascular: Regular rate and rhythm, S1/S2 +, no murmurs, no rubs, no gallops Respiratory: Clear to auscultation bilaterally, no wheezing, mild crackles at bases  Abdominal: Soft, non tender, non distended, bowel sounds +, no guarding Extremities: Both groin and BK incision are intact and tender to palpation, no drainage,surrounding erythema is better and improving   Neuro: Grossly nonfocal  Discharge Instructions  Discharge Instructions    Diet - low sodium heart healthy    Complete by:  As directed      Increase activity slowly    Complete by:  As directed             Medication List    STOP taking these medications        Oxycodone HCl 10 MG Tabs      TAKE these medications        albuterol 108 (90 BASE) MCG/ACT inhaler  Commonly known as:  PROVENTIL HFA;VENTOLIN HFA  Inhale 2 puffs into the lungs every 6 (six) hours as needed for wheezing or shortness of breath. Shortness of breath     albuterol (2.5 MG/3ML) 0.083% nebulizer solution  Commonly known as:  PROVENTIL  Take 3 mLs (2.5 mg total) by nebulization 4 (four) times daily. Dx J44.1     ALPRAZolam 1 MG tablet  Commonly known as:  XANAX  Take 1 tablet (1 mg total) by mouth 3 (three) times daily.     apixaban 5 MG Tabs tablet  Commonly known as:  ELIQUIS  Take 1 tablet (5 mg total) by mouth 2 (two) times daily.     aspirin EC 81 MG tablet  Take 81 mg by mouth daily.     doxazosin 8 MG tablet  Commonly known as:  CARDURA  Take 1 tablet (8 mg total) by mouth at bedtime.     Fluticasone Furoate-Vilanterol 100-25 MCG/INH Aepb  Commonly known as:  BREO ELLIPTA  Inhale 1 puff into the lungs every morning.     furosemide 40 MG tablet  Commonly known as:  LASIX  Take 1 tablet (40 mg total) by mouth 2 (two) times daily.     guaiFENesin 600 MG 12 hr tablet  Commonly known as:  MUCINEX  Take 2 tablets (1,200 mg total) by mouth 2 (two) times daily.     HYDROcodone-acetaminophen 10-325 MG per tablet  Commonly known as:  NORCO  Take 1 tablet by mouth 3 (three) times daily.     HYDROmorphone 4 MG tablet  Commonly known as:  DILAUDID  Take 1 tablet (4 mg total) by mouth every 4 (four) hours as needed for moderate pain.     ipratropium 0.02 % nebulizer solution  Commonly known as:  ATROVENT  Take 0.5 mg by nebulization 4 (four) times daily.     Ipratropium-Albuterol  20-100 MCG/ACT Aers respimat  Commonly known as:  COMBIVENT RESPIMAT  Inhale 1 puff into the lungs 4 (four) times daily.     mometasone-formoterol 100-5 MCG/ACT Aero  Commonly known as:  DULERA  Inhale 2 puffs into the lungs 2 (two) times daily.     moxifloxacin 400 MG tablet  Commonly known as:  AVELOX  Take 1 tablet (400 mg total) by mouth daily at 8 pm.     nicotine 21 mg/24hr patch  Commonly known as:  NICODERM CQ - dosed in mg/24 hours  Place 21 mg onto the skin daily.  nitroGLYCERIN 0.3 MG SL tablet  Commonly known as:  NITROSTAT  Place 0.3 mg under the tongue every 5 (five) minutes as needed for chest pain.     pantoprazole 40 MG tablet  Commonly known as:  PROTONIX  Take 1 tablet (40 mg total) by mouth daily at 12 noon.     predniSONE 10 MG tablet  Commonly known as:  DELTASONE  Take 50 mg tablet today and taper down by 10 mg daily until completed           Follow-up Information    Follow up with Minerva Ends, MD.   Specialty:  Family Medicine   Contact information:   [place] [address] [phone]       Follow up with Eldridge Abrahams, MD.   Specialty:  Vascular Surgery   Contact information:   [address] [place] [address] [phone]       Follow up with Faye Ramsay, MD.   Specialty:  Internal Medicine   Why:  As needed, If symptoms worsen   Contact information:   [address] [place] [address] [phone]        The results of significant diagnostics from this hospitalization (including imaging, microbiology, ancillary and laboratory) are listed below for reference.     Microbiology: Recent Results (from the past 240 hour(s))  Blood culture (routine x 2)     Status: None (Preliminary result)   Collection Time: 08/09/14 10:54 AM  Result Value Ref Range Status   Specimen Description BLOOD RIGHT HAND  Final   Special Requests BOTTLES DRAWN AEROBIC AND ANAEROBIC 5ML   Final   Culture   Final           BLOOD CULTURE RECEIVED NO GROWTH TO DATE CULTURE WILL BE HELD FOR 5 DAYS BEFORE ISSUING A FINAL NEGATIVE REPORT Performed at [facility]    Report Status PENDING  Incomplete  Blood culture (routine x 2)     Status: None (Preliminary result)   Collection Time: 08/09/14 10:55 AM  Result Value Ref Range Status   Specimen Description BLOOD LEFT FOREARM  Final   Special Requests BOTTLES DRAWN AEROBIC AND ANAEROBIC 5ML  Final   Culture   Final           BLOOD CULTURE RECEIVED NO GROWTH TO DATE CULTURE WILL BE HELD FOR 5 DAYS BEFORE ISSUING A FINAL NEGATIVE REPORT Performed at [facility]    Report Status PENDING  Incomplete  Wound culture     Status: None (Preliminary result)   Collection Time: 08/09/14 11:03 AM  Result Value Ref Range Status   Specimen Description WOUND RIGHT LEG CALF  Final   Special Requests NONE  Final   Gram Stain PENDING  Incomplete   Culture   Final    ABUNDANT STAPHYLOCOCCUS AUREUS Note: ROFS Performed at [facility]    Report Status PENDING  Incomplete     Labs: Basic Metabolic Panel:  Recent Labs Lab 08/09/14 1054 08/09/14 1638 08/10/14 0420 08/11/14 0529  NA 142  --  136 136  K 3.8  --  4.4 4.3  CL 106  --  101 98  CO2 25  --  26 27  GLUCOSE 130*  --  173* 168*  BUN 25*  --  36* 44*  CREATININE 1.25  --  1.34 1.56*  CALCIUM 9.4  --  9.2 9.3  MG  --  2.1  --   --  Liver Function Tests:  Recent Labs Lab 08/09/14 1054 08/09/14 1638 08/10/14 0420  AST 25 29 23   ALT 23 25 22   ALKPHOS 59 62 54  BILITOT 0.6 0.6 0.7  PROT 7.4 7.6 7.1  ALBUMIN 4.0 4.1 4.2   CBC:  Recent Labs Lab 08/09/14 1054 08/10/14 0420 08/11/14 0529  WBC 10.2 9.0 12.9*  HGB 12.4* 11.2* 11.3*  HCT 39.4 36.7* 37.0*  MCV 92.7 92.7 92.7  PLT 214 221 214   Cardiac Enzymes:  Recent Labs Lab 08/09/14 1638 08/09/14 2217 08/10/14 0420  TROPONINI 0.04* 0.03 0.03   BNP: BNP (last 3  results)  Recent Labs  03/20/14 0906 04/17/14 0049 05/13/14 2140  PROBNP 185.0* 1368.0* 697.9*   CBG:  Recent Labs Lab 08/10/14 1150 08/10/14 1639 08/10/14 2146 08/11/14 0734 08/11/14 1122  GLUCAP 153* 234* 204* 153* 207*     SIGNED: Time coordinating discharge: Over 30 minutes  Faye Ramsay, MD  [facility] 08/11/2014, 11:34 AM Pager [phone]  If 7PM-7AM, please contact night-coverage [email or web address] Password [email or web address]

## 2014-08-12 ENCOUNTER — Telehealth: Payer: Self-pay | Admitting: *Deleted

## 2014-08-12 LAB — WOUND CULTURE: Gram Stain: NONE SEEN

## 2014-08-12 NOTE — Telephone Encounter (Signed)
Pt aware of resutls Stated will make F/U appointment with PCP

## 2014-08-12 NOTE — Telephone Encounter (Signed)
-----   Message from Minerva Ends, MD sent at 08/12/2014  9:25 AM EST ----- Normal Le venous doppler w/o blood clots

## 2014-08-14 ENCOUNTER — Inpatient Hospital Stay: Payer: 59 | Admitting: Internal Medicine

## 2014-08-14 ENCOUNTER — Telehealth: Payer: Self-pay

## 2014-08-14 NOTE — Telephone Encounter (Signed)
Post-discharge Follow-Up Phone Call:  Date of Discharge: 08/11/14 Principal Discharge Diagnosis: Cellulitis of BK incision, acute on chronic systolic and diastolic CHF, essential hypertension, chronic respiratory failure secondary to OSA, COPD, peripheral artery disease, chronic kidney disease, stage 2 Post-discharge communication: Call placed to patient for follow-up, to determine if patient obtained discharge medications, and to discuss need for hospital follow-up appointment with Dr. Adrian Blackwater.  Unable to reach patient. Voicemail left requesting return call.

## 2014-08-15 LAB — CULTURE, BLOOD (ROUTINE X 2)
Culture: NO GROWTH
Culture: NO GROWTH

## 2014-08-16 ENCOUNTER — Telehealth: Payer: Self-pay

## 2014-08-16 ENCOUNTER — Encounter: Payer: Self-pay | Admitting: Surgery

## 2014-08-16 ENCOUNTER — Telehealth: Payer: Self-pay | Admitting: Internal Medicine

## 2014-08-16 MED ORDER — ALBUTEROL SULFATE HFA 108 (90 BASE) MCG/ACT IN AERS: 2.0000 | INHALATION_SPRAY | Freq: Four times a day (QID) | RESPIRATORY_TRACT | Status: DC | PRN

## 2014-08-16 MED ORDER — IPRATROPIUM-ALBUTEROL 20-100 MCG/ACT IN AERS: 1.0000 | INHALATION_SPRAY | Freq: Four times a day (QID) | RESPIRATORY_TRACT | Status: AC

## 2014-08-16 NOTE — Telephone Encounter (Signed)
Post-discharge Follow-Up Phone Call:  Date of Discharge: 08/11/14 Principal Discharge Diagnosis: Cellulitis of BK incision, acute on chronic systolic and diastolic CHF, essential hypertension, chronic respiratory failure secondary to OSA, COPD, peripheral artery disease, chronic kidney disease, Stage 2    Please check all that apply: X  Patient is knowledgeable of his/her condition(s) and/or treatment. ? Family and/or caregiver is knowledgeable of patient's condition(s) and/or treatment. X  Patient is caring for self at home. ? Patient is receiving home health services. If so, name of agency.    Medication Reconciliation:  X  Medication list reviewed with patient. X  Patient has not obtained all needed medications. Patient indicates he picked up Avelox and Prednisone and has been taking both as prescribed; however, he has not picked up Lasix. Discussed uses and importance of Lasix and instructed patient to pick up Lasix 40 mg today and begin taking it twice daily.   Activities of Daily Living:  X  Independent-uses a cane for mobility ? Needs assist ? [facility]   Community resources in place for patient:  X  None  ? Home Health  ? Assisted Living ? Hospice ? Support Group   Questions/Concerns discussed: Patient s/p recent right fem-BK pop bypass with gortex for severe claudication on 08/01/14 by Dr. Trula Slade.  Patient hospitalized 08/09/14-08/11/14 for cellulitis of BK incision. Patient indicates his right leg is no longer red or as painful; however, he indicates he is having "clear light yellow" drainage from incision on right leg that is sometimes running down leg.  Patient indicates there is no pus coming from incision site and he has been afebrile.  Patient has appointment with Dr. Trula Slade on 08/19/14 at 0830. Spoke with Dr. Doreene Burke about patient's serous drainage from right leg.  Dr. Doreene Burke indicates patient should continue taking Avelox as prescribed and begin taking Lasix as  prescribed.  He should keep appointment with Dr. Trula Slade on 08/19/14 and walk-in to [facility] on 08/19/14 as well for evaluation.  Patient should go to Emergency Department if he develops fever, increasing pain, redness or pus from wound.  Patient given all instructions, and he verbalized understanding.  Patient repeated back all information and indicates he will keep appointment with Dr. Trula Slade and walk-in to [facility] on 08/19/14. Patient scheduled for complete hospital follow-up appointment with Dr. Adrian Blackwater on 08/27/14 at 0900. Patient appreciative of follow-up phone call.

## 2014-08-16 NOTE — Telephone Encounter (Signed)
Pt needs refill on albuterol and combivent.  Nothing further needed.

## 2014-08-16 NOTE — Telephone Encounter (Signed)
Post-discharge Follow-Up Phone Call:  Date of Discharge: 08/11/14 Principal Discharge Diagnosis: Cellulitis of BK incision, acute on chronic systolic and diastolic CHF, essential hypertension, chronic respiratory failure secondary to OSA, COPD, peripheral artery disease, chronic kidney disease, stage 2 Post-discharge communication: Call placed once again to patient for follow-up, to determine if patient obtained discharge medications, and to discuss need for hospital follow-up appointment with Dr. Adrian Blackwater. Unable to reach patient x 2 attempts. Voicemail left requesting return call.

## 2014-08-19 ENCOUNTER — Encounter: Payer: Self-pay | Admitting: Nurse Practitioner

## 2014-08-19 ENCOUNTER — Inpatient Hospital Stay (HOSPITAL_COMMUNITY)
Admission: AD | Admit: 2014-08-19 | Discharge: 2014-08-22 | DRG: 291 | Disposition: A | Discharge status: Home Health | Payer: Medicare Other | Source: Ambulatory Visit | Attending: Cardiovascular Disease | Admitting: Cardiovascular Disease

## 2014-08-19 ENCOUNTER — Ambulatory Visit (INDEPENDENT_AMBULATORY_CARE_PROVIDER_SITE_OTHER): Payer: Medicare Other | Admitting: Nurse Practitioner

## 2014-08-19 ENCOUNTER — Other Ambulatory Visit: Payer: Self-pay | Admitting: Nurse Practitioner

## 2014-08-19 ENCOUNTER — Encounter (HOSPITAL_COMMUNITY): Payer: Self-pay | Admitting: General Practice

## 2014-08-19 ENCOUNTER — Ambulatory Visit (INDEPENDENT_AMBULATORY_CARE_PROVIDER_SITE_OTHER): Payer: Medicare Other | Admitting: Surgery

## 2014-08-19 ENCOUNTER — Encounter: Payer: Self-pay | Admitting: Surgery

## 2014-08-19 VITALS — BP 157/88 | HR 64 | Temp 98.2°F | Resp 20 | Ht 68.5 in | Wt 222.0 lb

## 2014-08-19 VITALS — BP 180/70 | HR 84 | Resp 24 | Ht 68.5 in | Wt 227.6 lb

## 2014-08-19 DIAGNOSIS — E785 Hyperlipidemia, unspecified: Secondary | ICD-10-CM | POA: Diagnosis present

## 2014-08-19 DIAGNOSIS — Z6834 Body mass index (BMI) 34.0-34.9, adult: Secondary | ICD-10-CM

## 2014-08-19 DIAGNOSIS — I10 Essential (primary) hypertension: Secondary | ICD-10-CM | POA: Diagnosis present

## 2014-08-19 DIAGNOSIS — I255 Ischemic cardiomyopathy: Secondary | ICD-10-CM | POA: Diagnosis present

## 2014-08-19 DIAGNOSIS — I70209 Unspecified atherosclerosis of native arteries of extremities, unspecified extremity: Secondary | ICD-10-CM

## 2014-08-19 DIAGNOSIS — I480 Paroxysmal atrial fibrillation: Secondary | ICD-10-CM | POA: Diagnosis present

## 2014-08-19 DIAGNOSIS — G8929 Other chronic pain: Secondary | ICD-10-CM | POA: Diagnosis present

## 2014-08-19 DIAGNOSIS — I2510 Atherosclerotic heart disease of native coronary artery without angina pectoris: Secondary | ICD-10-CM | POA: Diagnosis present

## 2014-08-19 DIAGNOSIS — R072 Precordial pain: Secondary | ICD-10-CM

## 2014-08-19 DIAGNOSIS — I739 Peripheral vascular disease, unspecified: Secondary | ICD-10-CM | POA: Diagnosis present

## 2014-08-19 DIAGNOSIS — Z87891 Personal history of nicotine dependence: Secondary | ICD-10-CM

## 2014-08-19 DIAGNOSIS — F4322 Adjustment disorder with anxiety: Secondary | ICD-10-CM | POA: Diagnosis present

## 2014-08-19 DIAGNOSIS — Z7901 Long term (current) use of anticoagulants: Secondary | ICD-10-CM

## 2014-08-19 DIAGNOSIS — E669 Obesity, unspecified: Secondary | ICD-10-CM | POA: Diagnosis present

## 2014-08-19 DIAGNOSIS — J449 Chronic obstructive pulmonary disease, unspecified: Secondary | ICD-10-CM | POA: Diagnosis present

## 2014-08-19 DIAGNOSIS — Z888 Allergy status to other drugs, medicaments and biological substances status: Secondary | ICD-10-CM

## 2014-08-19 DIAGNOSIS — R0602 Shortness of breath: Secondary | ICD-10-CM

## 2014-08-19 DIAGNOSIS — Z8546 Personal history of malignant neoplasm of prostate: Secondary | ICD-10-CM

## 2014-08-19 DIAGNOSIS — I129 Hypertensive chronic kidney disease with stage 1 through stage 4 chronic kidney disease, or unspecified chronic kidney disease: Secondary | ICD-10-CM | POA: Diagnosis present

## 2014-08-19 DIAGNOSIS — Z886 Allergy status to analgesic agent status: Secondary | ICD-10-CM

## 2014-08-19 DIAGNOSIS — Z955 Presence of coronary angioplasty implant and graft: Secondary | ICD-10-CM

## 2014-08-19 DIAGNOSIS — Z951 Presence of aortocoronary bypass graft: Secondary | ICD-10-CM

## 2014-08-19 DIAGNOSIS — M1990 Unspecified osteoarthritis, unspecified site: Secondary | ICD-10-CM | POA: Diagnosis present

## 2014-08-19 DIAGNOSIS — I5041 Acute combined systolic (congestive) and diastolic (congestive) heart failure: Secondary | ICD-10-CM

## 2014-08-19 DIAGNOSIS — R0600 Dyspnea, unspecified: Secondary | ICD-10-CM

## 2014-08-19 DIAGNOSIS — N182 Chronic kidney disease, stage 2 (mild): Secondary | ICD-10-CM | POA: Diagnosis present

## 2014-08-19 DIAGNOSIS — I5043 Acute on chronic combined systolic (congestive) and diastolic (congestive) heart failure: Principal | ICD-10-CM | POA: Diagnosis present

## 2014-08-19 DIAGNOSIS — L98499 Non-pressure chronic ulcer of skin of other sites with unspecified severity: Secondary | ICD-10-CM

## 2014-08-19 DIAGNOSIS — R918 Other nonspecific abnormal finding of lung field: Secondary | ICD-10-CM | POA: Diagnosis present

## 2014-08-19 DIAGNOSIS — G4733 Obstructive sleep apnea (adult) (pediatric): Secondary | ICD-10-CM | POA: Diagnosis present

## 2014-08-19 DIAGNOSIS — Z7982 Long term (current) use of aspirin: Secondary | ICD-10-CM

## 2014-08-19 DIAGNOSIS — N183 Chronic kidney disease, stage 3 (moderate): Secondary | ICD-10-CM | POA: Diagnosis present

## 2014-08-19 DIAGNOSIS — F419 Anxiety disorder, unspecified: Secondary | ICD-10-CM | POA: Diagnosis present

## 2014-08-19 DIAGNOSIS — Z9981 Dependence on supplemental oxygen: Secondary | ICD-10-CM

## 2014-08-19 DIAGNOSIS — Z7952 Long term (current) use of systemic steroids: Secondary | ICD-10-CM

## 2014-08-19 DIAGNOSIS — J9610 Chronic respiratory failure, unspecified whether with hypoxia or hypercapnia: Secondary | ICD-10-CM

## 2014-08-19 DIAGNOSIS — J9620 Acute and chronic respiratory failure, unspecified whether with hypoxia or hypercapnia: Secondary | ICD-10-CM | POA: Diagnosis present

## 2014-08-19 HISTORY — DX: Family history of other specified conditions: Z84.89

## 2014-08-19 LAB — COMPREHENSIVE METABOLIC PANEL
ALT: 25 U/L (ref 0–53)
AST: 24 U/L (ref 0–37)
Albumin: 4.2 g/dL (ref 3.5–5.2)
Alkaline Phosphatase: 56 U/L (ref 39–117)
Anion gap: 10 (ref 5–15)
BUN: 39 mg/dL — ABNORMAL HIGH (ref 6–23)
CO2: 25 mmol/L (ref 19–32)
Calcium: 9.1 mg/dL (ref 8.4–10.5)
Chloride: 104 mmol/L (ref 96–112)
Creatinine, Ser: 1.6 mg/dL — ABNORMAL HIGH (ref 0.50–1.35)
GFR calc Af Amer: 49 mL/min — ABNORMAL LOW (ref >90)
GFR calc non Af Amer: 42 mL/min — ABNORMAL LOW (ref >90)
Glucose, Bld: 160 mg/dL — ABNORMAL HIGH (ref 70–99)
Potassium: 4.1 mmol/L (ref 3.5–5.1)
Sodium: 139 mmol/L (ref 135–145)
Total Bilirubin: 0.6 mg/dL (ref 0.3–1.2)
Total Protein: 7.4 g/dL (ref 6.0–8.3)

## 2014-08-19 LAB — CBC WITH DIFFERENTIAL/PLATELET
Basophils Absolute: 0 10*3/uL (ref 0.0–0.1)
Basophils Relative: 0 % (ref 0–1)
Eosinophils Absolute: 0 10*3/uL (ref 0.0–0.7)
Eosinophils Relative: 0 % (ref 0–5)
HCT: 43.7 % (ref 39.0–52.0)
Hemoglobin: 13.7 g/dL (ref 13.0–17.0)
Lymphocytes Relative: 4 % — ABNORMAL LOW (ref 12–46)
Lymphs Abs: 0.5 10*3/uL — ABNORMAL LOW (ref 0.7–4.0)
MCH: 29.3 pg (ref 26.0–34.0)
MCHC: 31.4 g/dL (ref 30.0–36.0)
MCV: 93.4 fL (ref 78.0–100.0)
Monocytes Absolute: 0.7 10*3/uL (ref 0.1–1.0)
Monocytes Relative: 6 % (ref 3–12)
Neutro Abs: 10.8 10*3/uL — ABNORMAL HIGH (ref 1.7–7.7)
Neutrophils Relative %: 90 % — ABNORMAL HIGH (ref 43–77)
Platelets: 218 10*3/uL (ref 150–400)
RBC: 4.68 MIL/uL (ref 4.22–5.81)
RDW: 16.7 % — ABNORMAL HIGH (ref 11.5–15.5)
WBC: 12.1 10*3/uL — ABNORMAL HIGH (ref 4.0–10.5)

## 2014-08-19 LAB — TROPONIN I
Troponin I: 0.04 ng/mL — ABNORMAL HIGH (ref <0.031)
Troponin I: 0.05 ng/mL — ABNORMAL HIGH (ref <0.031)
Troponin I: 0.04 ng/mL — ABNORMAL HIGH (ref <0.031)

## 2014-08-19 LAB — BRAIN NATRIURETIC PEPTIDE: B Natriuretic Peptide: 476.3 pg/mL — ABNORMAL HIGH (ref 0.0–100.0)

## 2014-08-19 LAB — PROTIME-INR
INR: 1.14 (ref 0.00–1.49)
Prothrombin Time: 14.8 seconds (ref 11.6–15.2)

## 2014-08-19 LAB — APTT: aPTT: 28 seconds (ref 24–37)

## 2014-08-19 LAB — MAGNESIUM: Magnesium: 2.4 mg/dL (ref 1.5–2.5)

## 2014-08-19 LAB — TSH: TSH: 0.802 u[IU]/mL (ref 0.350–4.500)

## 2014-08-19 MED ORDER — SODIUM CHLORIDE 0.9 % IV SOLN: 250.0000 mL | INTRAVENOUS | Status: DC | PRN

## 2014-08-19 MED ORDER — NICOTINE 21 MG/24HR TD PT24
21.0000 mg | MEDICATED_PATCH | Freq: Every day | TRANSDERMAL | Status: DC
  Administered 2014-08-19 – 2014-08-22 (×4): 21 mg via TRANSDERMAL
  Filled 2014-08-19 (×4): qty 1

## 2014-08-19 MED ORDER — IPRATROPIUM-ALBUTEROL 0.5-2.5 (3) MG/3ML IN SOLN
3.0000 mL | RESPIRATORY_TRACT | Status: DC
  Administered 2014-08-19 – 2014-08-22 (×16): 3 mL via RESPIRATORY_TRACT
  Filled 2014-08-19 (×15): qty 3

## 2014-08-19 MED ORDER — MOMETASONE FURO-FORMOTEROL FUM 100-5 MCG/ACT IN AERO
2.0000 | INHALATION_SPRAY | Freq: Two times a day (BID) | RESPIRATORY_TRACT | Status: DC
  Administered 2014-08-19 – 2014-08-22 (×6): 2 via RESPIRATORY_TRACT
  Filled 2014-08-19: qty 8.8

## 2014-08-19 MED ORDER — HYDROMORPHONE HCL 2 MG PO TABS
4.0000 mg | ORAL_TABLET | ORAL | Status: DC | PRN
  Administered 2014-08-19 – 2014-08-21 (×10): 4 mg via ORAL
  Filled 2014-08-19 (×9): qty 2

## 2014-08-19 MED ORDER — PANTOPRAZOLE SODIUM 40 MG PO TBEC
40.0000 mg | DELAYED_RELEASE_TABLET | Freq: Every day | ORAL | Status: DC
  Administered 2014-08-22: 40 mg via ORAL
  Filled 2014-08-19 (×2): qty 1

## 2014-08-19 MED ORDER — HYDROCODONE-ACETAMINOPHEN 10-325 MG PO TABS
1.0000 | ORAL_TABLET | Freq: Three times a day (TID) | ORAL | Status: DC | PRN
  Administered 2014-08-19 – 2014-08-20 (×2): 1 via ORAL
  Filled 2014-08-19 (×2): qty 1

## 2014-08-19 MED ORDER — GUAIFENESIN ER 600 MG PO TB12
1200.0000 mg | ORAL_TABLET | Freq: Two times a day (BID) | ORAL | Status: DC
  Administered 2014-08-21: 1200 mg via ORAL
  Filled 2014-08-19 (×8): qty 2

## 2014-08-19 MED ORDER — DOXAZOSIN MESYLATE 8 MG PO TABS
8.0000 mg | ORAL_TABLET | Freq: Every day | ORAL | Status: DC
  Administered 2014-08-20 – 2014-08-22 (×3): 8 mg via ORAL
  Filled 2014-08-19 (×5): qty 1

## 2014-08-19 MED ORDER — ALPRAZOLAM 0.5 MG PO TABS
1.0000 mg | ORAL_TABLET | Freq: Three times a day (TID) | ORAL | Status: DC
  Administered 2014-08-19 – 2014-08-22 (×10): 1 mg via ORAL
  Filled 2014-08-19 (×10): qty 2

## 2014-08-19 MED ORDER — ACETAMINOPHEN 325 MG PO TABS: 650.0000 mg | ORAL_TABLET | ORAL | Status: DC | PRN

## 2014-08-19 MED ORDER — ALPRAZOLAM ER 1 MG PO TB24: 1.0000 mg | ORAL_TABLET | Freq: Three times a day (TID) | ORAL | Status: DC

## 2014-08-19 MED ORDER — LEVOFLOXACIN 750 MG PO TABS
750.0000 mg | ORAL_TABLET | Freq: Every day | ORAL | Status: DC
  Administered 2014-08-20 – 2014-08-21 (×2): 750 mg via ORAL
  Filled 2014-08-19 (×6): qty 1

## 2014-08-19 MED ORDER — SODIUM CHLORIDE 0.9 % IJ SOLN
3.0000 mL | Freq: Two times a day (BID) | INTRAMUSCULAR | Status: DC
  Administered 2014-08-19 – 2014-08-22 (×5): 3 mL via INTRAVENOUS

## 2014-08-19 MED ORDER — PREDNISONE 10 MG PO TABS
10.0000 mg | ORAL_TABLET | Freq: Every day | ORAL | Status: DC
  Administered 2014-08-20 – 2014-08-22 (×3): 10 mg via ORAL
  Filled 2014-08-19 (×5): qty 1

## 2014-08-19 MED ORDER — ONDANSETRON HCL 4 MG/2ML IJ SOLN
4.0000 mg | Freq: Four times a day (QID) | INTRAMUSCULAR | Status: DC | PRN
  Filled 2014-08-19: qty 2

## 2014-08-19 MED ORDER — SODIUM CHLORIDE 0.9 % IJ SOLN: 3.0000 mL | INTRAMUSCULAR | Status: DC | PRN

## 2014-08-19 MED ORDER — ALBUTEROL SULFATE (2.5 MG/3ML) 0.083% IN NEBU
2.5000 mg | INHALATION_SOLUTION | Freq: Four times a day (QID) | RESPIRATORY_TRACT | Status: DC
Start: 2014-08-19 — End: 2014-08-19

## 2014-08-19 MED ORDER — IPRATROPIUM-ALBUTEROL 20-100 MCG/ACT IN AERS: 1.0000 | INHALATION_SPRAY | Freq: Four times a day (QID) | RESPIRATORY_TRACT | Status: DC

## 2014-08-19 MED ORDER — FUROSEMIDE 10 MG/ML IJ SOLN
80.0000 mg | Freq: Two times a day (BID) | INTRAMUSCULAR | Status: DC
  Administered 2014-08-19 – 2014-08-21 (×5): 80 mg via INTRAVENOUS
  Filled 2014-08-19 (×8): qty 8

## 2014-08-19 MED ORDER — IPRATROPIUM BROMIDE 0.02 % IN SOLN: 0.5000 mg | Freq: Four times a day (QID) | RESPIRATORY_TRACT | Status: DC

## 2014-08-19 MED ORDER — APIXABAN 5 MG PO TABS
5.0000 mg | ORAL_TABLET | Freq: Two times a day (BID) | ORAL | Status: DC
  Administered 2014-08-19 – 2014-08-22 (×6): 5 mg via ORAL
  Filled 2014-08-19 (×9): qty 1

## 2014-08-19 NOTE — Progress Notes (Signed)
Patient arrived to the unit. Patient A/O, VSS, NSR on the monitor. Patient complaining of pain 9/10 to his right groin and leg. POC discussed, patient verbalized understanding. afleming RN

## 2014-08-19 NOTE — H&P (Signed)
Lucas [name]  08/19/2014 10:00 AM  Office Visit  MRN:  [id number]   Description: Male DOB: 04-22-1946  Provider: Burtis Junes, NP  Department: [facility] [facility]       Vital Signs  Most recent update: 08/19/2014 10:00 AM by Burtis Junes, NP    BP Pulse Resp Ht Wt BMI    180/70 mmHg 84 24 5' 8.5" (1.74 m) 227 lb 9.6 oz (103.239 kg) 34.10 kg/m2     SpO2              96%         Vitals History     Progress Notes      Burtis Junes, NP at 08/19/2014 9:26 AM     Status: Signed       Expand All Collapse All       CARDIOLOGY OFFICE NOTE  Date: 08/19/2014    Lucas [name] Date of Birth: 1946/04/12 Medical Record #[id number]  PCP: Minerva Ends, MD Cardiologist: Johnsie Cancel   Chief Complaint  Patient presents with  . Congestive Heart Failure    Work in visit - seen for Dr. Johnsie Cancel. Referred by Dr. Trula Slade.      History of Present Illness: Lucas [name] is a 69 y.o. male who presents today for a work in visit. Seen for Dr. Johnsie Cancel. He is a chronically ill male with multiple issues. These include COPD with chronic respiratory failure on home oxygen at night, chronic systolic HF with EF of 40 to 45%, remote renal cancer with past nephrectomy with CKD stage III, CAD with past CABG with his LIMA to the LAD patent and the SVG to OM3 but all other SVGs remain occluded. His other issues include anxiety, PAF and long standing tobacco abuse, PAD with past angioplasty in June of 2015 and chronic pain. Numerous hospitalizations. He is now on chronic anticoagulation but has had issues in the past with affording the Eliquis.   Last seen by me back in August. Saw Dr. Johnsie Cancel in September and last in December - working with Dr. Trula Slade for his revascularization issues and is s/p femoral-popliteal bypass to the right leg on 08/01/14 - long post op course. Readmitted at the end of January with cellulitis. Was felt to be at increased risk from our  standpoint. He has continued to smoke.   Comes in today. Here alone. Called earlier with abdominal bloating. Was just at Dr. Stephens Shire and a small area on his incision was opened. Anastamosis not noted to be involved. Dr. Trula Slade felt his swelling was more related to the bypass on the right but not necessarily on the left. Tells me he has not smoked for the last 4 days but had been prior. He has hit his left hand - bleeding all over the place. Right leg is draining as well. He is short of breath. Says he can't breath. Eating too much salt - had sausage over the weekend. Ate barbeque sandwiches last week. Weight is up. More swelling but mostly in his belly - very hard and protruberant. Right leg is red. He remains on chronic Prednisone. Coughing - all clear - little yellow and green last week. No fever.    Past Medical History  Diagnosis Date  . CAD (coronary artery disease)     a. s/p CABG x 5; b. 04/2012 Cath: patent LIMA->LAD and VG->OM3, all other grafts occluded. c. Cath 12/2013: patent SVG-OM3 & LIMA-LAD, known occ other VG.  Marland Kitchen Hypertension   .  Adjustment disorder with anxiety   . Hyperlipidemia     a. Unable to take statins.  Marland Kitchen BPH (benign prostatic hypertrophy)   . COPD (chronic obstructive pulmonary disease)     a. on home O2.  . Arthritis   . Ischemic cardiomyopathy     a. 06/2013 Echo: EF 30-35%, no reg wma's, Gr2 DD, triv AI, mod dil LA, nl RV fxn.  . Chronic systolic CHF (congestive heart failure)     a. 06/2013 Echo: EF 30-35%. b. 11/08/13 EF 40-45%, LVH, HK of the mid-distalanterseptal myocardium, trivial Ao regurg, calcified mitral annulus, RA mildly dilated  . Prostate cancer dx'd 2012  . Renal cancer dx'd 1997    lt nephrectomy  . CKD (chronic kidney disease), stage III   . Tobacco abuse     a. 27 yr hx as of 2015, transitioned to e-cig.  Marland Kitchen Syncope     a. 08/2013: the day following cath, no injury, had  received multiple doses of Versed for anxiety - this was felt to be a contributing factor.  Marland Kitchen PAF (paroxysmal atrial fibrillation)     a. on Eliquis 5 mg bid  . Hypotension     a. H/o soft BP prohibiting ACEI/ARB use.  . Chronic pain   . PAD (peripheral artery disease)     a. 12/2013: PV angio s/p angioplasty to R external iliac artery stenosis, also has R SFA occlusion with reconstitution of above-knee popliteal artery, 2 vessel runoff via peroneal and posterior tib.b. LE doppler 02/2014 ABI right 0.52, left 0.95  . Pulmonary nodules     a. 12/2013: small pulmonary nodules measuring up to 13mm in RM/LL, f/u recommended 6-12 months.  . Anxiety Dx 2004  . Anginal pain     none recent  . Shortness of breath dyspnea   . Rheumatic fever     child    Past Surgical History  Procedure Laterality Date  . Coronary artery bypass graft      x 5  . Cardiac catheterization    . Nephrectomy      left nephrectomy for ca  . Posterior cervical laminectomy      x 8 limited ROM and can't lie flat  . Heart stents      x 5  . Robot assisted laparoscopic radical prostatectomy      for prostate cancer  . Cystoscopy with litholapaxy  05/01/2012    Procedure: CYSTOSCOPY WITH LITHOLAPAXY; Surgeon: Dutch Gray, MD; Location: [facility] ORS; Service: Urology; Laterality: N/A;  . Prostate cancer    . Kidney surgery Left 1994  . Left and right heart catheterization with coronary/graft angiogram N/A 04/24/2012    Procedure: LEFT AND RIGHT HEART CATHETERIZATION WITH Beatrix Fetters; Surgeon: Sherren Mocha, MD; Location: [facility] CATH LAB; Service: Cardiovascular; Laterality: N/A;  . Left and right heart catheterization with coronary angiogram N/A 08/21/2013    Procedure: LEFT AND RIGHT HEART CATHETERIZATION WITH CORONARY ANGIOGRAM; Surgeon: Josue Hector, MD; Location: [facility] CATH LAB;  Service: Cardiovascular; Laterality: N/A;  . Lower extremity angiogram N/A 08/21/2013    Procedure: LOWER EXTREMITY ANGIOGRAM; Surgeon: Josue Hector, MD; Location: [facility] CATH LAB; Service: Cardiovascular; Laterality: N/A;  . Abdominal aortagram N/A 12/25/2013    Procedure: ABDOMINAL AORTAGRAM; Surgeon: Serafina Mitchell, MD; Location: [facility] CATH LAB; Service: Cardiovascular; Laterality: N/A;  . Left heart catheterization with coronary/graft angiogram N/A 04/19/2014    Procedure: LEFT HEART CATHETERIZATION WITH Beatrix Fetters; Surgeon: Jettie Booze, MD; Location: [facility] CATH LAB; Service: Cardiovascular; Laterality:  N/A;  . Abdominal aortagram N/A 06/26/2014    Procedure: ABDOMINAL AORTAGRAM; Surgeon: Serafina Mitchell, MD; Location: [facility] CATH LAB; Service: Cardiovascular; Laterality: N/A;  . Femoral-popliteal bypass graft Right 07/26/2014    Procedure: BYPASS GRAFT FEMORAL-POPLITEAL ARTERY- right leg with PTFE; Surgeon: Serafina Mitchell, MD; Location: [facility] OR; Service: Vascular; Laterality: Right;     Medications: Current Outpatient Prescriptions  Medication Sig Dispense Refill  . albuterol (PROVENTIL HFA;VENTOLIN HFA) 108 (90 BASE) MCG/ACT inhaler Inhale 2 puffs into the lungs every 6 (six) hours as needed for wheezing or shortness of breath. Shortness of breath 1 Inhaler 5  . albuterol (PROVENTIL) (2.5 MG/3ML) 0.083% nebulizer solution Take 3 mLs (2.5 mg total) by nebulization 4 (four) times daily. Dx J44.1 360 mL 5  . ALPRAZolam (XANAX) 1 MG tablet Take 1 tablet (1 mg total) by mouth 3 (three) times daily. 90 tablet 2  . apixaban (ELIQUIS) 5 MG TABS tablet Take 1 tablet (5 mg total) by mouth 2 (two) times daily. 60 tablet 1  . aspirin EC 81 MG tablet Take 81 mg by mouth daily.    Marland Kitchen doxazosin (CARDURA) 8 MG tablet Take 1 tablet (8 mg total) by mouth at bedtime. 60 tablet 0  . Fluticasone  Furoate-Vilanterol (BREO ELLIPTA) 100-25 MCG/INH AEPB Inhale 1 puff into the lungs every morning. 1 each 6  . furosemide (LASIX) 40 MG tablet Take 1 tablet (40 mg total) by mouth 2 (two) times daily. 60 tablet 1  . guaiFENesin (MUCINEX) 600 MG 12 hr tablet Take 2 tablets (1,200 mg total) by mouth 2 (two) times daily. 10 tablet 0  . HYDROcodone-acetaminophen (NORCO) 10-325 MG per tablet Take 1 tablet by mouth 3 (three) times daily. (Patient taking differently: Take 1 tablet by mouth 5 (five) times daily. ) 15 tablet 0  . HYDROmorphone (DILAUDID) 4 MG tablet Take 1 tablet (4 mg total) by mouth every 4 (four) hours as needed for moderate pain. 90 tablet 0  . ipratropium (ATROVENT) 0.02 % nebulizer solution Take 0.5 mg by nebulization 4 (four) times daily.    . Ipratropium-Albuterol (COMBIVENT RESPIMAT) 20-100 MCG/ACT AERS respimat Inhale 1 puff into the lungs 4 (four) times daily. 1 Inhaler 5  . mometasone-formoterol (DULERA) 100-5 MCG/ACT AERO Inhale 2 puffs into the lungs 2 (two) times daily. 1 Inhaler 5  . moxifloxacin (AVELOX) 400 MG tablet Take 1 tablet (400 mg total) by mouth daily at 8 pm. 14 tablet 0  . nicotine (NICODERM CQ - DOSED IN MG/24 HOURS) 21 mg/24hr patch Place 21 mg onto the skin daily.    . nitroGLYCERIN (NITROSTAT) 0.3 MG SL tablet Place 0.3 mg under the tongue every 5 (five) minutes as needed for chest pain.    . pantoprazole (PROTONIX) 40 MG tablet Take 1 tablet (40 mg total) by mouth daily at 12 noon. (Patient taking differently: Take 40 mg by mouth daily as needed (For heartburn or acid reflux.). ) 30 tablet 1  . predniSONE (DELTASONE) 10 MG tablet Take 50 mg tablet today and taper down by 10 mg daily until completed 15 tablet 5   No current facility-administered medications for this visit.    Allergies: Allergies  Allergen Reactions  . Ativan [Lorazepam] Other (See Comments)    Increases agitation,  tolerates xanax  . Lisinopril Cough  . Morphine And Related Other (See Comments)    Hallucinations, too sedated when given with ativan  . Ibuprofen Hives, Nausea And Vomiting and Rash    Tolerates baby aspirin  per patient. There is no allergy to any dose of aspirin. Pt refuses to take 325mg  because it caused nose bleeds.    Social History: The patient  reports that he quit smoking 5 days ago. His smoking use included Cigarettes. He has a 2.7 pack-year smoking history. He quit smokeless tobacco use about 2 years ago. He reports that he drinks alcohol. He reports that he does not use illicit drugs.  Family History: The patient's family history includes COPD in his sister; Coronary artery disease in an other family member; Diabetes in his father and mother; Heart attack in his brother, father, and mother; Heart disease in his brother, father, and mother; Hypertension in his mother.   Review of Systems: Please see the history of present illness. Otherwise, the review of systems is positive for weight gain, progressive dyspnea, cough, back pain, easy bruising, fatigue, leg pain, wheezing and anxiety . All other systems are reviewed and negative.   Physical Exam: VS: BP 180/70 mmHg  Pulse 84  Resp 24  Ht 5' 8.5" (1.74 m)  Wt 227 lb 9.6 oz (103.239 kg)  BMI 34.10 kg/m2  SpO2 96% . BMI Body mass index is 34.1 kg/(m^2).  Wt Readings from Last 3 Encounters:  08/19/14 227 lb 9.6 oz (103.239 kg)  08/19/14 222 lb (100.699 kg)  08/11/14 226 lb 6.4 oz (102.694 kg)    General: Chronically ill appearing. Short of breath and in mild distress. Not able to lie back on the exam table. Smells heavily of tobacco. Weight is up 5 pounds.  HEENT: Normal.  Neck: Supple, no JVD, carotid bruits, or masses noted.  Cardiac: Regular rate and rhythm. Heart tones distant. Pitting edema noted in both legs. Respiratory: Lungs are quiet tight. Some wheezing.  GI: Abdomen  protruberant and very tight.  MS: No deformity or atrophy. Gait and ROM intact.  Skin: Warm and dry. Color is normal. Lots of ecchymosis.  Neuro: Strength and sensation are intact and no gross focal deficits noted.  Psych: Alert, appropriate and with normal affect.   LABORATORY DATA:  EKG: EKG is ordered today. This demonstrates NSR with PAC and PVC.   Recent Labs    Lab Results  Component Value Date   WBC 12.9* 08/11/2014   HGB 11.3* 08/11/2014   HCT 37.0* 08/11/2014   PLT 214 08/11/2014   GLUCOSE 168* 08/11/2014   CHOL 208* 07/01/2013   TRIG 144 07/01/2013   HDL 34* 07/01/2013   LDLCALC 145* 07/01/2013   ALT 22 08/10/2014   AST 23 08/10/2014   NA 136 08/11/2014   K 4.3 08/11/2014   CL 98 08/11/2014   CREATININE 1.56* 08/11/2014   BUN 44* 08/11/2014   CO2 27 08/11/2014   TSH 0.962 08/09/2014   INR 1.25 08/09/2014   HGBA1C 6.1* 08/09/2014      BNP (last 3 results)  Recent Labs (within last 365 days)     Recent Labs  08/09/14 1055  BNP 375.4*      ProBNP (last 3 results)  Recent Labs (within last 365 days)     Recent Labs  03/20/14 0906 04/17/14 0049 05/13/14 2140  PROBNP 185.0* 1368.0* 697.9*       Other Studies Reviewed Today:   Echo Study Conclusions from January 2016  - Left ventricle: The cavity size was normal. There was mild concentric hypertrophy. Systolic function was mildly to moderately reduced. The estimated ejection fraction was in the range of 40% to 45%. Probable hypokinesis of the apicalanteroseptal myocardium.  Features are consistent with a pseudonormal left ventricular filling pattern, with concomitant abnormal relaxation and increased filling pressure (grade 2 diastolic dysfunction). - Aortic valve: There was mild to moderate regurgitation directed centrally in the LVOT. - Left atrium: The atrium was  moderately to severely dilated.     PROCEDURE: Left heart catheterization with selective coronary angiography, left ventriculogram. Bypass angiography  INDICATIONS:   The risks, benefits, and details of the procedure were explained to the patient. The patient verbalized understanding and wanted to proceed. Informed written consent was obtained.  PROCEDURE TECHNIQUE: After Xylocaine anesthesia a 69F slender sheath was placed in the last radial artery with a single anterior needle wall stick. Right coronary angiography was done using a Judkins R4 guide catheter. SVG to RCA was engaged with a JR 4. Left coronary angiography was done using a Judkins L3.5 guide catheter. SVG to OM was engaged with the LCB catheter. Left ventriculography was done using a pigtail catheter. IMA was engaged with a LIMA catheter. A TR band was used for hemostasis.  CONTRAST: Total of 115 cc.  COMPLICATIONS: None.   HEMODYNAMICS: Aortic pressure was 96/54; LV pressure was 100/14; LVEDP 18. There was no gradient between the left ventricle and aorta.   ANGIOGRAPHIC DATA: The left main coronary artery is disease. There is a 50% lesion at the ostium..  The left anterior descending artery is occluded in the midportion. There several medium-size diagonals prior to the occlusion which are patent. The LIMA to LAD is widely patent. The mid to distal LAD has moderate disease. There are brisk collaterals to the PDA and distal RCA system. There is a large diagonal which also fills retrograde.  The left circumflex artery is a large vessel. There is moderate disease proximally. There are 2 obtuse marginals which are patent. There is a subtotal occlusion in the mid circumflex after an atrial branch. The remainder of the circumflex fills antegrade.  SVG to OM is patent with mild proximal disease.  The right coronary artery is occluded proximally. The distal RCA territory feeds from collaterals from the LAD, which  is fed from the LIMA.  The SVG to RCA is occluded.  LEFT VENTRICULOGRAM: Left ventricular angiogram was done in the 30 RAO projection and revealed mild global hypokinesis with mildly decreased systolic function and an estimated ejection fraction of 45%. LVEDP was 18 mmHg.  IMPRESSIONS:  1. Moderately diseased left main coronary artery. 2. Severely diseased native mid left anterior descending artery. Patent LIMA to LAD. 3. Severe disease in the native left circumflex artery and its branches. Patent SVG to OM with mild, proximal disease in the graft. 4. Occluded native right coronary artery. Left to right collaterals fill the distal RCA. 5. Mildly decreased left ventricular systolic function. LVEDP 18 mmHg. Ejection fraction 45%.  RECOMMENDATION: Continue medical therapy. Cardiac cath was done under general anesthesia today. Given the patient's baseline anxiety, general anesthesia is necessary for this procedure. Followup with Dr. Johnsie Cancel.  Restart Eliquis tomorrow if left radial site is ok.     Assessment / Plan: 1. Chronic respiratory failure -progressive dyspnea - most likely multifactorial from systolic/diastolic HF and COPD - continues to smoke - will need readmission for diuresis. His long term prognosis is quite tenuous at best. May need discussion about long term plan of care/code status, etc. He has very poor insight into his medical issues. I have placed a pharmacy consult as well to look at his medicines.   2. Chronic systolic HF - EF of  40 to 45% - Has weight gain, swelling on exam, short of breath - plan to readmit for diuresis.   3. CAD - no active chest pain -  Would manage medically  4. Tobacco abuse -  Add back nicoderm patch  5. HTN - BP sky high.   6. PVD - with recent surgery - slow and delayed healing - will alert Dr. Trula Slade to his admission.   7. Chronic steroid use.   Will proceed with admission to rule out MI, diurese, etc. Treat BP. Once again I  do not feel that we will be able to turn this around at home. Probably needs social work to see for disposition due to numerous admissions. Has bed available on 2w. Patient is subsequently seen by Dr. Caryl Comes who is in agreement with admission for diuresis.   Current medicines are reviewed with the patient today. The patient does not have concerns regarding medicines other than what has been noted above.  The following changes have been made: See above.  Labs/ tests ordered today include:   No orders of the defined types were placed in this encounter.    Disposition:   Patient is agreeable to this plan and will call if any problems develop in the interim.   Signed: Burtis Junes, RN, ANP-C 08/19/2014 10:06 AM  [facility] [address] [place], [place] [address] Phone: [phone] Fax: [phone]                   Referring Provider     Minerva Ends, MD     Diagnoses     Acute combined systolic and diastolic heart failure - Primary    ICD-9-CM: 428.41 ICD-10-CM: I50.41    Precordial pain     ICD-9-CM: 786.51 ICD-10-CM: R07.2    Essential hypertension     ICD-9-CM: 401.9 ICD-10-CM: I10    Shortness of breath     ICD-9-CM: 786.05 ICD-10-CM: R06.02       Reason for Visit     Congestive Heart Failure    Work in visit - seen for Dr. Johnsie Cancel. Referred by Dr. Trula Slade.     Reason for Visit History        Level of Service     LOS - NO CHARGE [NC1]      Follow-up and Disposition     Routing History       All Charges for This Encounter     Code Description Service Date Service Provider Modifiers Qty    [place], COMPLETE 08/19/2014 Burtis Junes, NP  1    NC1 LOS - NO CHARGE 08/19/2014 Burtis Junes, NP  1      AVS Reports     Date/Time Report Action User    08/19/2014 9:30 AM After Visit Summary Printed Tamsen Snider      Routing History     There are no sent or routed communications associated with this encounter.     Previous Visit       Provider Department Encounter #    08/19/2014 8:30 AM Truitt Merle, NP [facility] [id number]

## 2014-08-19 NOTE — Progress Notes (Signed)
CARDIOLOGY OFFICE NOTE  Date:  08/19/2014    Lucas [name] Date of Birth: Jun 12, 1946 Medical Record #[id number]  PCP:  Minerva Ends, MD  Cardiologist:  Johnsie Cancel    Chief Complaint  Patient presents with  . Congestive Heart Failure    Work in visit - seen for Dr. Johnsie Cancel. Referred by Dr. Trula Slade.      History of Present Illness: Lucas [name] is a 69 y.o. male who presents today for a work in visit. Seen for Dr. Johnsie Cancel. He is a chronically ill male with multiple issues. These include COPD with chronic respiratory failure on home oxygen at night, chronic systolic HF with EF of 40 to 45%, remote renal cancer with past nephrectomy with CKD stage III, CAD with past CABG with his LIMA to the LAD patent and the SVG to OM3 but all other SVGs remain occluded. His other issues include anxiety, PAF and long standing tobacco abuse, PAD with past angioplasty in June of 2015 and chronic pain. Numerous hospitalizations. He is now on chronic anticoagulation but has had issues in the past with affording the Eliquis.   Last seen by me back in August. Saw Dr. Johnsie Cancel in September and last in December - working with Dr. Trula Slade for his revascularization issues and is s/p femoral-popliteal bypass to the right leg on 08/01/14 - long post op course. Readmitted at the end of January with cellulitis. Was felt to be at increased risk from our standpoint. He has continued to smoke.   Comes in today. Here alone. Called earlier with abdominal bloating. Was just at Dr. Stephens Shire and a small area on his incision was opened. Anastamosis not noted to be involved. Dr. Trula Slade felt his swelling was more related to the bypass on the right but not necessarily on the left. Tells me he has not smoked for the last 4 days but had been prior. He has hit his left hand - bleeding all over the place. Right leg is draining as well. He is short of breath. Says he can't breath. Eating too much salt - had sausage over the  weekend. Ate barbeque sandwiches last week.  Weight is up. More swelling but mostly in his belly - very hard and protruberant. Right leg is red. He remains on chronic Prednisone. Coughing - all clear - little yellow and green last week. No fever.    Past Medical History  Diagnosis Date  . CAD (coronary artery disease)     a. s/p CABG x 5;  b. 04/2012 Cath: patent LIMA->LAD and VG->OM3, all other grafts occluded. c. Cath 12/2013: patent SVG-OM3 & LIMA-LAD, known occ other VG.  Marland Kitchen Hypertension   . Adjustment disorder with anxiety   . Hyperlipidemia     a. Unable to take statins.  Marland Kitchen BPH (benign prostatic hypertrophy)   . COPD (chronic obstructive pulmonary disease)     a. on home O2.  . Arthritis   . Ischemic cardiomyopathy     a. 06/2013 Echo: EF 30-35%, no reg wma's, Gr2 DD, triv AI, mod dil LA, nl RV fxn.  . Chronic systolic CHF (congestive heart failure)     a. 06/2013 Echo: EF 30-35%. b. 11/08/13 EF 40-45%, LVH, HK of the mid-distalanterseptal myocardium, trivial Ao regurg, calcified mitral annulus, RA mildly dilated  . Prostate cancer dx'd 2012  . Renal cancer dx'd 1997    lt nephrectomy  . CKD (chronic kidney disease), stage III   . Tobacco abuse  a. 56 yr hx as of 2015, transitioned to e-cig.  Marland Kitchen Syncope     a. 08/2013: the day following cath, no injury, had received multiple doses of Versed for anxiety - this was felt to be a contributing factor.  Marland Kitchen PAF (paroxysmal atrial fibrillation)     a. on Eliquis 5 mg bid  . Hypotension     a. H/o soft BP prohibiting ACEI/ARB use.  . Chronic pain   . PAD (peripheral artery disease)     a. 12/2013: PV angio s/p angioplasty to R external iliac artery stenosis, also has R SFA occlusion with reconstitution of above-knee popliteal artery, 2 vessel runoff via peroneal and posterior tib.b. LE doppler 02/2014 ABI right 0.52, left 0.95  . Pulmonary nodules     a. 12/2013:  small pulmonary nodules measuring up to 23mm in RM/LL, f/u recommended 6-12  months.  . Anxiety Dx 2004  . Anginal pain     none recent  . Shortness of breath dyspnea   . Rheumatic fever     child    Past Surgical History  Procedure Laterality Date  . Coronary artery bypass graft      x 5  . Cardiac catheterization    . Nephrectomy      left nephrectomy for ca  . Posterior cervical laminectomy      x 8   limited ROM  and can't lie flat  . Heart stents      x 5  . Robot assisted laparoscopic radical prostatectomy      for prostate cancer  . Cystoscopy with litholapaxy  05/01/2012    Procedure: CYSTOSCOPY WITH LITHOLAPAXY;  Surgeon: Dutch Gray, MD;  Location: [facility] ORS;  Service: Urology;  Laterality: N/A;  . Prostate cancer    . Kidney surgery Left 1994  . Left and right heart catheterization with coronary/graft angiogram N/A 04/24/2012    Procedure: LEFT AND RIGHT HEART CATHETERIZATION WITH Beatrix Fetters;  Surgeon: Sherren Mocha, MD;  Location: [facility] CATH LAB;  Service: Cardiovascular;  Laterality: N/A;  . Left and right heart catheterization with coronary angiogram N/A 08/21/2013    Procedure: LEFT AND RIGHT HEART CATHETERIZATION WITH CORONARY ANGIOGRAM;  Surgeon: Josue Hector, MD;  Location: [facility] CATH LAB;  Service: Cardiovascular;  Laterality: N/A;  . Lower extremity angiogram N/A 08/21/2013    Procedure: LOWER EXTREMITY ANGIOGRAM;  Surgeon: Josue Hector, MD;  Location: [facility] CATH LAB;  Service: Cardiovascular;  Laterality: N/A;  . Abdominal aortagram N/A 12/25/2013    Procedure: ABDOMINAL AORTAGRAM;  Surgeon: Serafina Mitchell, MD;  Location: [facility] CATH LAB;  Service: Cardiovascular;  Laterality: N/A;  . Left heart catheterization with coronary/graft angiogram N/A 04/19/2014    Procedure: LEFT HEART CATHETERIZATION WITH Beatrix Fetters;  Surgeon: Jettie Booze, MD;  Location: [facility] CATH LAB;  Service: Cardiovascular;  Laterality: N/A;  . Abdominal aortagram N/A 06/26/2014    Procedure: ABDOMINAL AORTAGRAM;  Surgeon: Serafina Mitchell, MD;   Location: [facility] CATH LAB;  Service: Cardiovascular;  Laterality: N/A;  . Femoral-popliteal bypass graft Right 07/26/2014    Procedure: BYPASS GRAFT FEMORAL-POPLITEAL ARTERY- right leg with PTFE;  Surgeon: Serafina Mitchell, MD;  Location: [facility] OR;  Service: Vascular;  Laterality: Right;     Medications: Current Outpatient Prescriptions  Medication Sig Dispense Refill  . albuterol (PROVENTIL HFA;VENTOLIN HFA) 108 (90 BASE) MCG/ACT inhaler Inhale 2 puffs into the lungs every 6 (six) hours as needed for wheezing or shortness of breath. Shortness of breath 1  Inhaler 5  . albuterol (PROVENTIL) (2.5 MG/3ML) 0.083% nebulizer solution Take 3 mLs (2.5 mg total) by nebulization 4 (four) times daily. Dx J44.1 360 mL 5  . ALPRAZolam (XANAX) 1 MG tablet Take 1 tablet (1 mg total) by mouth 3 (three) times daily. 90 tablet 2  . apixaban (ELIQUIS) 5 MG TABS tablet Take 1 tablet (5 mg total) by mouth 2 (two) times daily. 60 tablet 1  . aspirin EC 81 MG tablet Take 81 mg by mouth daily.    Marland Kitchen doxazosin (CARDURA) 8 MG tablet Take 1 tablet (8 mg total) by mouth at bedtime. 60 tablet 0  . Fluticasone Furoate-Vilanterol (BREO ELLIPTA) 100-25 MCG/INH AEPB Inhale 1 puff into the lungs every morning. 1 each 6  . furosemide (LASIX) 40 MG tablet Take 1 tablet (40 mg total) by mouth 2 (two) times daily. 60 tablet 1  . guaiFENesin (MUCINEX) 600 MG 12 hr tablet Take 2 tablets (1,200 mg total) by mouth 2 (two) times daily. 10 tablet 0  . HYDROcodone-acetaminophen (NORCO) 10-325 MG per tablet Take 1 tablet by mouth 3 (three) times daily. (Patient taking differently: Take 1 tablet by mouth 5 (five) times daily. ) 15 tablet 0  . HYDROmorphone (DILAUDID) 4 MG tablet Take 1 tablet (4 mg total) by mouth every 4 (four) hours as needed for moderate pain. 90 tablet 0  . ipratropium (ATROVENT) 0.02 % nebulizer solution Take 0.5 mg by nebulization 4 (four) times daily.    . Ipratropium-Albuterol (COMBIVENT RESPIMAT) 20-100 MCG/ACT AERS respimat  Inhale 1 puff into the lungs 4 (four) times daily. 1 Inhaler 5  . mometasone-formoterol (DULERA) 100-5 MCG/ACT AERO Inhale 2 puffs into the lungs 2 (two) times daily. 1 Inhaler 5  . moxifloxacin (AVELOX) 400 MG tablet Take 1 tablet (400 mg total) by mouth daily at 8 pm. 14 tablet 0  . nicotine (NICODERM CQ - DOSED IN MG/24 HOURS) 21 mg/24hr patch Place 21 mg onto the skin daily.    . nitroGLYCERIN (NITROSTAT) 0.3 MG SL tablet Place 0.3 mg under the tongue every 5 (five) minutes as needed for chest pain.    . pantoprazole (PROTONIX) 40 MG tablet Take 1 tablet (40 mg total) by mouth daily at 12 noon. (Patient taking differently: Take 40 mg by mouth daily as needed (For heartburn or acid reflux.). ) 30 tablet 1  . predniSONE (DELTASONE) 10 MG tablet Take 50 mg tablet today and taper down by 10 mg daily until completed 15 tablet 5   No current facility-administered medications for this visit.    Allergies: Allergies  Allergen Reactions  . Ativan [Lorazepam] Other (See Comments)    Increases agitation, tolerates xanax  . Lisinopril Cough  . Morphine And Related Other (See Comments)    Hallucinations, too sedated when given with ativan  . Ibuprofen Hives, Nausea And Vomiting and Rash    Tolerates baby aspirin per patient.  There is no allergy to any dose of aspirin. Pt refuses to take 325mg  because it caused nose bleeds.    Social History: The patient  reports that he quit smoking 5 days ago. His smoking use included Cigarettes. He has a 2.7 pack-year smoking history. He quit smokeless tobacco use about 2 years ago. He reports that he drinks alcohol. He reports that he does not use illicit drugs.   Family History: The patient's family history includes COPD in his sister; Coronary artery disease in an other family member; Diabetes in his father and mother; Heart attack in  his brother, father, and mother; Heart disease in his brother, father, and mother; Hypertension in his mother.   Review of  Systems: Please see the history of present illness.   Otherwise, the review of systems is positive for weight gain, progressive dyspnea, cough, back pain, easy bruising, fatigue, leg pain, wheezing and anxiety .   All other systems are reviewed and negative.   Physical Exam: VS:  BP 180/70 mmHg  Pulse 84  Resp 24  Ht 5' 8.5" (1.74 m)  Wt 227 lb 9.6 oz (103.239 kg)  BMI 34.10 kg/m2  SpO2 96% .  BMI Body mass index is 34.1 kg/(m^2).  Wt Readings from Last 3 Encounters:  08/19/14 227 lb 9.6 oz (103.239 kg)  08/19/14 222 lb (100.699 kg)  08/11/14 226 lb 6.4 oz (102.694 kg)    General:  Chronically ill appearing. Short of breath and in mild distress. Not able to lie back on the exam table. Smells heavily of tobacco. Weight is up 5 pounds.  HEENT: Normal. Neck: Supple, no JVD, carotid bruits, or masses noted.  Cardiac: Regular rate and rhythm. Heart tones distant. Pitting edema noted in both legs. Respiratory:  Lungs are quiet tight. Some wheezing.  GI: Abdomen protruberant and very tight.  MS: No deformity or atrophy. Gait and ROM intact. Skin: Warm and dry. Color is normal. Lots of ecchymosis.  Neuro:  Strength and sensation are intact and no gross focal deficits noted.  Psych: Alert, appropriate and with normal affect.   LABORATORY DATA:  EKG:  EKG is ordered today. This demonstrates NSR with PAC and PVC.  Lab Results  Component Value Date   WBC 12.9* 08/11/2014   HGB 11.3* 08/11/2014   HCT 37.0* 08/11/2014   PLT 214 08/11/2014   GLUCOSE 168* 08/11/2014   CHOL 208* 07/01/2013   TRIG 144 07/01/2013   HDL 34* 07/01/2013   LDLCALC 145* 07/01/2013   ALT 22 08/10/2014   AST 23 08/10/2014   NA 136 08/11/2014   K 4.3 08/11/2014   CL 98 08/11/2014   CREATININE 1.56* 08/11/2014   BUN 44* 08/11/2014   CO2 27 08/11/2014   TSH 0.962 08/09/2014   INR 1.25 08/09/2014   HGBA1C 6.1* 08/09/2014    BNP (last 3 results)  Recent Labs  08/09/14 1055  BNP 375.4*    ProBNP  (last 3 results)  Recent Labs  03/20/14 0906 04/17/14 0049 05/13/14 2140  PROBNP 185.0* 1368.0* 697.9*     Other Studies Reviewed Today:   Echo Study Conclusions from January 2016  - Left ventricle: The cavity size was normal. There was mild concentric hypertrophy. Systolic function was mildly to moderately reduced. The estimated ejection fraction was in the range of 40% to 45%. Probable hypokinesis of the apicalanteroseptal myocardium. Features are consistent with a pseudonormal left ventricular filling pattern, with concomitant abnormal relaxation and increased filling pressure (grade 2 diastolic dysfunction). - Aortic valve: There was mild to moderate regurgitation directed centrally in the LVOT. - Left atrium: The atrium was moderately to severely dilated.     PROCEDURE: Left heart catheterization with selective coronary angiography, left ventriculogram. Bypass angiography  INDICATIONS:   The risks, benefits, and details of the procedure were explained to the patient. The patient verbalized understanding and wanted to proceed. Informed written consent was obtained.  PROCEDURE TECHNIQUE: After Xylocaine anesthesia a 55F slender sheath was placed in the last radial artery with a single anterior needle wall stick. Right coronary angiography was done using a Judkins R4  guide catheter. SVG to RCA was engaged with a JR 4. Left coronary angiography was done using a Judkins L3.5 guide catheter. SVG to OM was engaged with the LCB catheter. Left ventriculography was done using a pigtail catheter. IMA was engaged with a LIMA catheter. A TR band was used for hemostasis.  CONTRAST: Total of 115 cc.  COMPLICATIONS: None.   HEMODYNAMICS: Aortic pressure was 96/54; LV pressure was 100/14; LVEDP 18. There was no gradient between the left ventricle and aorta.   ANGIOGRAPHIC DATA: The left main coronary artery is disease. There is a 50% lesion at the  ostium..  The left anterior descending artery is occluded in the midportion. There several medium-size diagonals prior to the occlusion which are patent. The LIMA to LAD is widely patent. The mid to distal LAD has moderate disease. There are brisk collaterals to the PDA and distal RCA system. There is a large diagonal which also fills retrograde.  The left circumflex artery is a large vessel. There is moderate disease proximally. There are 2 obtuse marginals which are patent. There is a subtotal occlusion in the mid circumflex after an atrial branch. The remainder of the circumflex fills antegrade.  SVG to OM is patent with mild proximal disease.  The right coronary artery is occluded proximally. The distal RCA territory feeds from collaterals from the LAD, which is fed from the LIMA.  The SVG to RCA is occluded.  LEFT VENTRICULOGRAM: Left ventricular angiogram was done in the 30 RAO projection and revealed mild global hypokinesis with mildly decreased systolic function and an estimated ejection fraction of 45%. LVEDP was 18 mmHg.  IMPRESSIONS:  1. Moderately diseased left main coronary artery. 2. Severely diseased native mid left anterior descending artery. Patent LIMA to LAD. 3. Severe disease in the native left circumflex artery and its branches. Patent SVG to OM with mild, proximal disease in the graft. 4. Occluded native right coronary artery. Left to right collaterals fill the distal RCA. 5. Mildly decreased left ventricular systolic function. LVEDP 18 mmHg. Ejection fraction 45%.  RECOMMENDATION: Continue medical therapy. Cardiac cath was done under general anesthesia today. Given the patient's baseline anxiety, general anesthesia is necessary for this procedure. Followup with Dr. Johnsie Cancel.  Restart Eliquis tomorrow if left radial site is ok.     Assessment / Plan: 1. Chronic respiratory failure -progressive dyspnea - most likely multifactorial from systolic/diastolic HF  and COPD - continues to smoke - will need readmission for diuresis. His long term prognosis is quite tenuous at best. May need discussion about long term plan of care/code status, etc. He has very poor insight into his medical issues.   2. Chronic systolic HF - EF of 40 to 45% - Has weight gain, swelling on exam, short of breath - plan to readmit for diuresis.   3. CAD - no active chest pain  4. Tobacco abuse  5. HTN - BP sky high.   6. PVD - with recent surgery - slow and delayed healing - will alert Dr. Trula Slade to his admission.   7. Chronic steroid use.   Will proceed with admission to rule out MI, diurese, etc. Treat BP. Once again I do not feel that we will be able to turn this around at home.  Probably needs social work to see for disposition due to numerous admissions. Has bed available on 2w. Patient is subsequently seen by Dr. Caryl Comes who is in agreement with admission for diuresis.   Current medicines are reviewed with the  patient today.  The patient does not have concerns regarding medicines other than what has been noted above.  The following changes have been made:  See above.  Labs/ tests ordered today include:   No orders of the defined types were placed in this encounter.     Disposition:     Patient is agreeable to this plan and will call if any problems develop in the interim.   Signed: Burtis Junes, RN, ANP-C 08/19/2014 10:06 AM  [facility] [address] [place], [place]  [address] Phone: [phone] Fax: [phone]

## 2014-08-19 NOTE — Progress Notes (Signed)
The patient is back today for a wound check.  He is s/p R LE fem BK Pop BPG with PTFE for severe claudication / rest pain.  He was recently admitted for a COPD flair.  He has swelling in his Right leg and erythema around his BK Pop incision.  He was discharged on Avelox.  He complains of abdominal swelling and LE swelling  I opened a small area on his BK incision.  I could probe a q-tip approximately 1.5 cm deep.  The anastamosis was not involved  He will undergo dressing changes with the assistance of home health.  He will continue his antibiotics.  I'm going to have cardiology see him to make sure that he is not having a CHF exacerbation given his abdominal swelling.  I suspect his lower extremity swelling is related to his bypass on the right but not necessarily so on the left.  He will follow-up with me in 2 weeks.

## 2014-08-20 ENCOUNTER — Inpatient Hospital Stay: Payer: Medicare Other | Admitting: Adult Health

## 2014-08-20 ENCOUNTER — Observation Stay (HOSPITAL_COMMUNITY): Payer: Medicare Other

## 2014-08-20 DIAGNOSIS — F419 Anxiety disorder, unspecified: Secondary | ICD-10-CM | POA: Diagnosis present

## 2014-08-20 DIAGNOSIS — G8929 Other chronic pain: Secondary | ICD-10-CM | POA: Diagnosis present

## 2014-08-20 DIAGNOSIS — Z888 Allergy status to other drugs, medicaments and biological substances status: Secondary | ICD-10-CM | POA: Diagnosis not present

## 2014-08-20 DIAGNOSIS — Z720 Tobacco use: Secondary | ICD-10-CM

## 2014-08-20 DIAGNOSIS — Z951 Presence of aortocoronary bypass graft: Secondary | ICD-10-CM | POA: Diagnosis not present

## 2014-08-20 DIAGNOSIS — N183 Chronic kidney disease, stage 3 (moderate): Secondary | ICD-10-CM | POA: Diagnosis present

## 2014-08-20 DIAGNOSIS — I480 Paroxysmal atrial fibrillation: Secondary | ICD-10-CM | POA: Diagnosis present

## 2014-08-20 DIAGNOSIS — I5043 Acute on chronic combined systolic (congestive) and diastolic (congestive) heart failure: Secondary | ICD-10-CM | POA: Diagnosis present

## 2014-08-20 DIAGNOSIS — R0602 Shortness of breath: Secondary | ICD-10-CM | POA: Diagnosis present

## 2014-08-20 DIAGNOSIS — E669 Obesity, unspecified: Secondary | ICD-10-CM | POA: Diagnosis present

## 2014-08-20 DIAGNOSIS — E785 Hyperlipidemia, unspecified: Secondary | ICD-10-CM | POA: Diagnosis present

## 2014-08-20 DIAGNOSIS — I739 Peripheral vascular disease, unspecified: Secondary | ICD-10-CM | POA: Diagnosis present

## 2014-08-20 DIAGNOSIS — I2510 Atherosclerotic heart disease of native coronary artery without angina pectoris: Secondary | ICD-10-CM | POA: Diagnosis present

## 2014-08-20 DIAGNOSIS — Z7901 Long term (current) use of anticoagulants: Secondary | ICD-10-CM | POA: Diagnosis not present

## 2014-08-20 DIAGNOSIS — I10 Essential (primary) hypertension: Secondary | ICD-10-CM

## 2014-08-20 DIAGNOSIS — Z886 Allergy status to analgesic agent status: Secondary | ICD-10-CM | POA: Diagnosis not present

## 2014-08-20 DIAGNOSIS — R918 Other nonspecific abnormal finding of lung field: Secondary | ICD-10-CM | POA: Diagnosis present

## 2014-08-20 DIAGNOSIS — Z7952 Long term (current) use of systemic steroids: Secondary | ICD-10-CM | POA: Diagnosis not present

## 2014-08-20 DIAGNOSIS — Z6834 Body mass index (BMI) 34.0-34.9, adult: Secondary | ICD-10-CM | POA: Diagnosis not present

## 2014-08-20 DIAGNOSIS — F4322 Adjustment disorder with anxiety: Secondary | ICD-10-CM | POA: Diagnosis present

## 2014-08-20 DIAGNOSIS — Z7982 Long term (current) use of aspirin: Secondary | ICD-10-CM | POA: Diagnosis not present

## 2014-08-20 DIAGNOSIS — I255 Ischemic cardiomyopathy: Secondary | ICD-10-CM

## 2014-08-20 DIAGNOSIS — I129 Hypertensive chronic kidney disease with stage 1 through stage 4 chronic kidney disease, or unspecified chronic kidney disease: Secondary | ICD-10-CM | POA: Diagnosis present

## 2014-08-20 DIAGNOSIS — J9620 Acute and chronic respiratory failure, unspecified whether with hypoxia or hypercapnia: Secondary | ICD-10-CM | POA: Diagnosis present

## 2014-08-20 DIAGNOSIS — Z9981 Dependence on supplemental oxygen: Secondary | ICD-10-CM | POA: Diagnosis not present

## 2014-08-20 DIAGNOSIS — Z955 Presence of coronary angioplasty implant and graft: Secondary | ICD-10-CM | POA: Diagnosis not present

## 2014-08-20 DIAGNOSIS — M1990 Unspecified osteoarthritis, unspecified site: Secondary | ICD-10-CM | POA: Diagnosis present

## 2014-08-20 DIAGNOSIS — Z87891 Personal history of nicotine dependence: Secondary | ICD-10-CM | POA: Diagnosis not present

## 2014-08-20 DIAGNOSIS — G4733 Obstructive sleep apnea (adult) (pediatric): Secondary | ICD-10-CM | POA: Diagnosis present

## 2014-08-20 DIAGNOSIS — Z8546 Personal history of malignant neoplasm of prostate: Secondary | ICD-10-CM | POA: Diagnosis not present

## 2014-08-20 LAB — BASIC METABOLIC PANEL
Anion gap: 11 (ref 5–15)
BUN: 45 mg/dL — ABNORMAL HIGH (ref 6–23)
CO2: 26 mmol/L (ref 19–32)
Calcium: 8.9 mg/dL (ref 8.4–10.5)
Chloride: 98 mmol/L (ref 96–112)
Creatinine, Ser: 1.57 mg/dL — ABNORMAL HIGH (ref 0.50–1.35)
GFR calc Af Amer: 50 mL/min — ABNORMAL LOW (ref >90)
GFR calc non Af Amer: 43 mL/min — ABNORMAL LOW (ref >90)
Glucose, Bld: 95 mg/dL (ref 70–99)
Potassium: 3.6 mmol/L (ref 3.5–5.1)
Sodium: 135 mmol/L (ref 135–145)

## 2014-08-20 MED ORDER — HYDROCODONE-ACETAMINOPHEN 10-325 MG PO TABS
1.0000 | ORAL_TABLET | Freq: Four times a day (QID) | ORAL | Status: DC | PRN
  Administered 2014-08-20 – 2014-08-21 (×5): 1 via ORAL
  Filled 2014-08-20 (×5): qty 1

## 2014-08-20 NOTE — Progress Notes (Signed)
Patient Name: Lucas [name] Date of Encounter: 08/20/2014  Primary cardiologist: Dr. Johnsie Cancel   Active Problems:   Acute on chronic systolic and diastolic heart failure, NYHA class 4    SUBJECTIVE  Continue to have SOB, says the swelling in his LE is not normal. Describing his prolonged battle with pain for the last several years and need for pain medication. States he also had some CP, R side CP worse with cough, L sided CP new since last admission for COPD. Some drop in temp last night, question accuracy  CURRENT MEDS . ALPRAZolam  1 mg Oral TID  . apixaban  5 mg Oral BID  . doxazosin  8 mg Oral Daily  . furosemide  80 mg Intravenous BID  . guaiFENesin  1,200 mg Oral BID  . ipratropium-albuterol  3 mL Nebulization Q4H  . levofloxacin  750 mg Oral Daily  . mometasone-formoterol  2 puff Inhalation BID  . nicotine  21 mg Transdermal Daily  . pantoprazole  40 mg Oral Daily  . predniSONE  10 mg Oral Q breakfast  . sodium chloride  3 mL Intravenous Q12H    OBJECTIVE  Filed Vitals:   08/19/14 1814 08/19/14 2034 08/19/14 2056 08/20/14 0440  BP: 117/77 137/60  126/72  Pulse:  61  54  Temp: 92.3 F (33.5 C) 97.7 F (36.5 C)  97.8 F (36.6 C)  TempSrc: Oral Oral  Oral  Resp: 18 18  18   Height:    5\' 8"  (1.727 m)  Weight:    222 lb 4.8 oz (100.835 kg)  SpO2: 98% 97% 99% 99%    Intake/Output Summary (Last 24 hours) at 08/20/14 0746 Last data filed at 08/20/14 0438  Gross per 24 hour  Intake    360 ml  Output   2150 ml  Net  -1790 ml   Filed Weights   08/19/14 1054 08/20/14 0440  Weight: 220 lb 14.4 oz (100.2 kg) 222 lb 4.8 oz (100.835 kg)    PHYSICAL EXAM  General: Pleasant, NAD. Neuro: Alert and oriented X 3. Moves all extremities spontaneously. Psych: Normal affect. HEENT:  Normal  Neck: Supple without bruits or JVD. Lungs:  Resp regular and unlabored. Markedly decreased breath sound bilaterally, with prolonged expiratory phase and wheezing.  Heart: RRR no  s3, s4, or murmurs. Abdomen: Soft, non-tender, non-distended, BS + x 4.  Extremities: No clubbing, cyanosis. Radials 2+ and equal bilaterally. 1-2+ pitting edema bilaterally. LLE wound continue to drain. Dressing likely need to be changed soon. R femoral wound no drainage  Accessory Clinical Findings  CBC  Recent Labs  08/19/14 1408  WBC 12.1*  NEUTROABS 10.8*  HGB 13.7  HCT 43.7  MCV 93.4  PLT 175   Basic Metabolic Panel  Recent Labs  08/19/14 1408 08/20/14 0546  NA 139 135  K 4.1 3.6  CL 104 98  CO2 25 26  GLUCOSE 160* 95  BUN 39* 45*  CREATININE 1.60* 1.57*  CALCIUM 9.1 8.9  MG 2.4  --    Liver Function Tests  Recent Labs  08/19/14 1408  AST 24  ALT 25  ALKPHOS 56  BILITOT 0.6  PROT 7.4  ALBUMIN 4.2   Cardiac Enzymes  Recent Labs  08/19/14 1408 08/19/14 2027 08/19/14 2231  TROPONINI 0.04* 0.05* 0.04*   Thyroid Function Tests  Recent Labs  08/19/14 1408  TSH 0.802    TELE NSR with frequent PACs with HR 50s    ECG  No new EKG  Echocardiogram 08/10/2014  LV EF: 40% -  45%  ------------------------------------------------------------------- Indications:   Dyspnea 786.09.  ------------------------------------------------------------------- History:  PMH: Obesity  Coronary artery disease. Chronic obstructive pulmonary disease. Risk factors: Current tobacco use. Hypertension.  ------------------------------------------------------------------- Study Conclusions  - Left ventricle: The cavity size was normal. There was mild concentric hypertrophy. Systolic function was mildly to moderately reduced. The estimated ejection fraction was in the range of 40% to 45%. Probable hypokinesis of the apicalanteroseptal myocardium. Features are consistent with a pseudonormal left ventricular filling pattern, with concomitant abnormal relaxation and increased filling pressure (grade 2 diastolic dysfunction). - Aortic  valve: There was mild to moderate regurgitation directed centrally in the LVOT. - Left atrium: The atrium was moderately to severely dilated    Radiology/Studies  Dg Chest 1 View  07/30/2014   CLINICAL DATA:  Difficulty breathing  EXAM: CHEST - 1 VIEW  COMPARISON:  Chest radiograph May 13, 2014 and chest CT May 14, 2014  FINDINGS: There is underlying emphysematous change. There is atelectatic change in the right base. There is no frank edema or consolidation. Heart is enlarged with pulmonary vascularity within normal limits. Patient is status post coronary artery bypass grafting. No pneumothorax. No adenopathy.  IMPRESSION: Atelectatic change right base. No edema or consolidation. Underlying emphysema. Stable cardiac enlargement.   Electronically Signed   By: Lowella Grip M.D.   On: 07/30/2014 07:44   Dg Chest 2 View  08/09/2014   CLINICAL DATA:  Shortness of breath, chest discomfort for 3 days.  EXAM: CHEST  2 VIEW  COMPARISON:  July 30, 2014.  FINDINGS: Stable cardiomediastinal silhouette. Status post coronary artery bypass graft. No pneumothorax or pleural effusion is noted. No acute pulmonary disease is noted. Bony thorax is intact.  IMPRESSION: No acute cardiopulmonary abnormality seen.   Electronically Signed   By: Sabino Dick M.D.   On: 08/09/2014 11:35    ASSESSMENT AND PLAN  1. Acute on chronic respiratory failure -progressive dyspnea - most likely multifactorial from acute on chronic combined systolic/diastolic HF and COPD - continues to smoke   - Cr improving after IV diuresis, -1.7L net I/O, will continue IV diuresis for now. Close monitor of renal function  2. Acute on chronic systolic HF - EF of 40 to 45%   3. CAD - no active chest pain - Would manage medically  4. Tobacco abuse - Add back nicoderm patch  5. HTN - BP sky high.   6. PVD - with recent surgery R fem-pop 08/01/14 - slow and delayed healing   - seen by Dr. Trula Slade in clinic yesterday,  dressing changed. Felt R sided swelling maybe related to surgery, but not the L side. Sent to cardiology clinic to r/o CHF given abdominal distension, where he was directly admitted  - on chronic prednisone.   7. Chronic steroid use.   8. H/o PAF: on eliquis  9. Mildly elevated trop: no clear trend, ?if demand ischemia in the setting of acute on chronic resp failure  Signed, Woodward Ku Pager: [phone]  I have seen and examined the patient along with Almyra Deforest PA-C.  I have reviewed the chart, notes and new data.  I agree with PA's note.  Improved edema after good UO, but still edematous. Minimally increased troponin is unlikely to be of clinical significance. Well controlled BP Stable creatinine in face of diuresis.  PLAN: "Dry weight" not clearly documented, but seemed to be OK at 98.8 kg last summer, now roiughly 3 kg more. Similarly,  baseline BNP uncertain (had proBNP as low as 180s in recent past). Continue IV diuretics today, probably transition to PO in AM  Sanda Klein, MD, [facility] and [place] [phone] 08/20/2014, 9:53 AM

## 2014-08-20 NOTE — Progress Notes (Signed)
UR completed 

## 2014-08-21 LAB — BASIC METABOLIC PANEL
Anion gap: 10 (ref 5–15)
BUN: 42 mg/dL — ABNORMAL HIGH (ref 6–23)
CO2: 26 mmol/L (ref 19–32)
Calcium: 8.5 mg/dL (ref 8.4–10.5)
Chloride: 100 mmol/L (ref 96–112)
Creatinine, Ser: 1.72 mg/dL — ABNORMAL HIGH (ref 0.50–1.35)
GFR calc Af Amer: 45 mL/min — ABNORMAL LOW (ref >90)
GFR calc non Af Amer: 39 mL/min — ABNORMAL LOW (ref >90)
Glucose, Bld: 100 mg/dL — ABNORMAL HIGH (ref 70–99)
Potassium: 3.6 mmol/L (ref 3.5–5.1)
Sodium: 136 mmol/L (ref 135–145)

## 2014-08-21 MED ORDER — HYDROMORPHONE HCL 1 MG/ML IJ SOLN
INTRAMUSCULAR | Status: AC
  Administered 2014-08-21: 0.4 mg via INTRAVENOUS
  Filled 2014-08-21: qty 1

## 2014-08-21 MED ORDER — HYDROMORPHONE HCL 2 MG PO TABS
ORAL_TABLET | ORAL | Status: AC
  Administered 2014-08-21: 4 mg via ORAL
  Filled 2014-08-21: qty 2

## 2014-08-21 MED ORDER — HYDROMORPHONE HCL 1 MG/ML IJ SOLN
0.4000 mg | INTRAMUSCULAR | Status: DC | PRN
  Administered 2014-08-21 – 2014-08-22 (×16): 0.4 mg via INTRAVENOUS
  Filled 2014-08-21 (×14): qty 1

## 2014-08-21 NOTE — Progress Notes (Signed)
Patient Name: Lucas [name] Date of Encounter: 08/21/2014  Primary cardiologist: Dr. Johnsie Cancel   Active Problems:   Acute on chronic systolic and diastolic heart failure, NYHA class 4    SUBJECTIVE  Continue to have SOB. Per nursing staff, patient was agitated last night and requested for frequent pain medication. Patient also called Dr. Trula Slade last night stating his RLE fem-pop wound continue to have drain and has significant pain. He was told Dr. Trula Slade will see him today. Intermittent CP which he has attributed to anxiety.  CURRENT MEDS . ALPRAZolam  1 mg Oral TID  . apixaban  5 mg Oral BID  . doxazosin  8 mg Oral Daily  . furosemide  80 mg Intravenous BID  . guaiFENesin  1,200 mg Oral BID  . ipratropium-albuterol  3 mL Nebulization Q4H  . levofloxacin  750 mg Oral Daily  . mometasone-formoterol  2 puff Inhalation BID  . nicotine  21 mg Transdermal Daily  . pantoprazole  40 mg Oral Daily  . predniSONE  10 mg Oral Q breakfast  . sodium chloride  3 mL Intravenous Q12H    OBJECTIVE  Filed Vitals:   08/20/14 2335 08/21/14 0405 08/21/14 0432 08/21/14 0734  BP:  130/68    Pulse: 62 51 80   Temp:  98 F (36.7 C)    TempSrc:  Oral    Resp: 18 20 20    Height:      Weight:  224 lb 3.3 oz (101.7 kg)    SpO2:  92%  91%    Intake/Output Summary (Last 24 hours) at 08/21/14 0945 Last data filed at 08/21/14 0851  Gross per 24 hour  Intake   1100 ml  Output   2900 ml  Net  -1800 ml   Filed Weights   08/19/14 1054 08/20/14 0440 08/21/14 0405  Weight: 220 lb 14.4 oz (100.2 kg) 222 lb 4.8 oz (100.835 kg) 224 lb 3.3 oz (101.7 kg)    PHYSICAL EXAM  General: Pleasant, NAD. Neuro: Alert and oriented X 3. Moves all extremities spontaneously. Psych: Normal affect. HEENT:  Normal  Neck: Supple without bruits or JVD. Lungs:  Resp regular and unlabored. Markedly decreased breath sound bilaterally, with prolonged expiratory phase and wheezing. No rale Heart: RRR no s3, s4, or  murmurs. Abdomen: Soft, non-tender, non-distended, BS + x 4.  Extremities: No clubbing, cyanosis. Radials 2+ and equal bilaterally. 1-2+ pitting edema bilaterally. LLE wound continue to drain. Dressing likely need to be changed soon. R femoral wound no drainage  Accessory Clinical Findings  CBC  Recent Labs  08/19/14 1408  WBC 12.1*  NEUTROABS 10.8*  HGB 13.7  HCT 43.7  MCV 93.4  PLT 563   Basic Metabolic Panel  Recent Labs  08/19/14 1408 08/20/14 0546 08/21/14 0523  NA 139 135 136  K 4.1 3.6 3.6  CL 104 98 100  CO2 25 26 26   GLUCOSE 160* 95 100*  BUN 39* 45* 42*  CREATININE 1.60* 1.57* 1.72*  CALCIUM 9.1 8.9 8.5  MG 2.4  --   --    Liver Function Tests  Recent Labs  08/19/14 1408  AST 24  ALT 25  ALKPHOS 56  BILITOT 0.6  PROT 7.4  ALBUMIN 4.2   Cardiac Enzymes  Recent Labs  08/19/14 1408 08/19/14 2027 08/19/14 2231  TROPONINI 0.04* 0.05* 0.04*   Thyroid Function Tests  Recent Labs  08/19/14 1408  TSH 0.802    TELE NSR with frequent PACs with HR  50s    ECG  No new EKG  Echocardiogram 08/10/2014  LV EF: 40% -  45%  ------------------------------------------------------------------- Indications:   Dyspnea 786.09.  ------------------------------------------------------------------- History:  PMH: Obesity  Coronary artery disease. Chronic obstructive pulmonary disease. Risk factors: Current tobacco use. Hypertension.  ------------------------------------------------------------------- Study Conclusions  - Left ventricle: The cavity size was normal. There was mild concentric hypertrophy. Systolic function was mildly to moderately reduced. The estimated ejection fraction was in the range of 40% to 45%. Probable hypokinesis of the apicalanteroseptal myocardium. Features are consistent with a pseudonormal left ventricular filling pattern, with concomitant abnormal relaxation and increased filling pressure (grade  2 diastolic dysfunction). - Aortic valve: There was mild to moderate regurgitation directed centrally in the LVOT. - Left atrium: The atrium was moderately to severely dilated    Radiology/Studies  Dg Chest 1 View  07/30/2014   CLINICAL DATA:  Difficulty breathing  EXAM: CHEST - 1 VIEW  COMPARISON:  Chest radiograph May 13, 2014 and chest CT May 14, 2014  FINDINGS: There is underlying emphysematous change. There is atelectatic change in the right base. There is no frank edema or consolidation. Heart is enlarged with pulmonary vascularity within normal limits. Patient is status post coronary artery bypass grafting. No pneumothorax. No adenopathy.  IMPRESSION: Atelectatic change right base. No edema or consolidation. Underlying emphysema. Stable cardiac enlargement.   Electronically Signed   By: Lowella Grip M.D.   On: 07/30/2014 07:44   Dg Chest 2 View  08/20/2014   CLINICAL DATA:  Shortness of breath, history of CHF  EXAM: CHEST  2 VIEW  COMPARISON:  PA and lateral chest x-ray of August 09, 2014  FINDINGS: The lungs are mildly hyperinflated with hemidiaphragm flattening. The interstitial markings are mildly prominent but stable. There is stable scarring or atelectasis posteriorly in the right lower lobe. There is no alveolar pneumonia. The cardiopericardial silhouette is enlarged. There are post CABG changes. The pulmonary vascularity is not engorged. There is mild tortuosity of the descending thoracic aorta.  IMPRESSION: COPD with mild chronic increase in the pulmonary interstitial markings. There is no focal pneumonia nor overt evidence of pulmonary edema.   Electronically Signed   By: David  Martinique   On: 08/20/2014 07:53   Dg Chest 2 View  08/09/2014   CLINICAL DATA:  Shortness of breath, chest discomfort for 3 days.  EXAM: CHEST  2 VIEW  COMPARISON:  July 30, 2014.  FINDINGS: Stable cardiomediastinal silhouette. Status post coronary artery bypass graft. No pneumothorax or  pleural effusion is noted. No acute pulmonary disease is noted. Bony thorax is intact.  IMPRESSION: No acute cardiopulmonary abnormality seen.   Electronically Signed   By: Sabino Dick M.D.   On: 08/09/2014 11:35    ASSESSMENT AND PLAN  1. Acute on chronic respiratory failure -progressive dyspnea - most likely multifactorial from acute on chronic combined systolic/diastolic HF and COPD - continues to smoke   - Cr worsened overnight. Cr 1.57 --> 1.72. IV lasix stopped  - I/O net -3.5L, weight not accurate  - he likely has chronic LE edema. His COPD is likely responsible for significant portion of SOB, still has significant wheezing despite on chronic prednisone, frequent duoneb, levaquin, dulera (he has very bad COPD at baseline), unclear if pulmonology would have anything else to offer.   - restart PO lasix tomorrow  2. Acute on chronic systolic HF - EF of 40 to 45%   3. CAD - no active chest pain - Would manage medically  4. Tobacco abuse - Add back nicoderm patch  5. HTN   6. PVD - with recent surgery R fem-pop 08/01/14 - slow and delayed healing   - seen by Dr. Trula Slade in clinic yesterday, dressing changed. Felt R sided swelling maybe related to surgery, but not the L side. Sent to cardiology clinic to r/o CHF given abdominal distension, where he was directly admitted  - on chronic prednisone.   7. Chronic steroid use.   8. H/o PAF: on eliquis  9. Mildly elevated trop: no clear trend, likely demand ischemia in the setting of acute on chronic resp failure and COPD  Signed, Woodward Ku Pager: [phone]  I have seen and examined the patient along with Almyra Deforest PA-C.  I have reviewed the chart, notes and new data.  I agree with PA's note.  Key new complaints: dyspnea at baseline (NYHA class III), good diuresis, complains center around leg pain Key examination changes: no edema or S3 Key new findings / data: creat increased to 1.7  PLAN: I think he is close to euvolemia,  maybe a little "dry". Keep on home dose of furosemide (40 mg BID) and try to keep at current weight Residual dyspnea is explained by severe COPD.  Sanda Klein, MD, [place] [phone] 08/21/2014, 12:53 PM

## 2014-08-22 LAB — BASIC METABOLIC PANEL
Anion gap: 11 (ref 5–15)
BUN: 39 mg/dL — ABNORMAL HIGH (ref 6–23)
CO2: 29 mmol/L (ref 19–32)
Calcium: 8.9 mg/dL (ref 8.4–10.5)
Chloride: 98 mmol/L (ref 96–112)
Creatinine, Ser: 1.56 mg/dL — ABNORMAL HIGH (ref 0.50–1.35)
GFR calc Af Amer: 51 mL/min — ABNORMAL LOW (ref >90)
GFR calc non Af Amer: 44 mL/min — ABNORMAL LOW (ref >90)
Glucose, Bld: 102 mg/dL — ABNORMAL HIGH (ref 70–99)
Potassium: 3.7 mmol/L (ref 3.5–5.1)
Sodium: 138 mmol/L (ref 135–145)

## 2014-08-22 LAB — TROPONIN I
Troponin I: 0.1 ng/mL — ABNORMAL HIGH (ref <0.031)
Troponin I: 0.04 ng/mL — ABNORMAL HIGH (ref <0.031)

## 2014-08-22 MED ORDER — LEVOFLOXACIN 750 MG PO TABS: 750.0000 mg | ORAL_TABLET | Freq: Every day | ORAL | Status: DC

## 2014-08-22 MED ORDER — IPRATROPIUM-ALBUTEROL 0.5-2.5 (3) MG/3ML IN SOLN
3.0000 mL | Freq: Four times a day (QID) | RESPIRATORY_TRACT | Status: DC
  Administered 2014-08-22: 3 mL via RESPIRATORY_TRACT
  Filled 2014-08-22: qty 3

## 2014-08-22 MED ORDER — FUROSEMIDE 40 MG PO TABS
40.0000 mg | ORAL_TABLET | Freq: Two times a day (BID) | ORAL | Status: DC
  Filled 2014-08-22 (×3): qty 1

## 2014-08-22 MED ORDER — ALBUTEROL SULFATE (2.5 MG/3ML) 0.083% IN NEBU: 2.5000 mg | INHALATION_SOLUTION | RESPIRATORY_TRACT | Status: DC | PRN

## 2014-08-22 NOTE — Care Management Note (Signed)
    Page 1 of 1   08/22/2014     2:19:18 PM CARE MANAGEMENT NOTE 08/22/2014  Patient:  Lucas [name], Lucas [name]   Account Number:  [id number]  Date Initiated:  08/22/2014  Documentation initiated by:  Marvetta Gibbons  Subjective/Objective Assessment:   Pt admitted wtih CHF, ARF     Action/Plan:   PTA Pt lived at home   Anticipated DC Date:  08/22/2014   Anticipated DC Plan:  [place]  CM consult      [place]   Choice offered to / List presented to:  C-1 Patient        [place] arranged  HH-1 RN      [place].   Status of service:  Completed, signed off Medicare Important Message given?  YES (If response is "NO", the following Medicare IM given date fields will be blank) Date Medicare IM given:  08/22/2014 Medicare IM given by:  Whitman Hero Date Additional Medicare IM given:   Additional Medicare IM given by:    Discharge Disposition:  [place]  Per UR Regulation:  Reviewed for med. necessity/level of care/duration of stay  If discussed at [place] of Stay Meetings, dates discussed:    Comments:  08/22/14- 1400- Marvetta Gibbons RN, BSN [phone] Pt for d/c home today, referral for [facility] needs for drsg changed- spoke with pt at bedside- pt states that he has cane and RW at home- pt does not know if [facility] was set up at the MD office- he reports that he has used [facility] in past and that is who he wants to use. Order for [place] placed- referral called to [facility] with [facility] for [place] (drsg changes and CHF management)-

## 2014-08-22 NOTE — Discharge Instructions (Signed)

## 2014-08-22 NOTE — Discharge Summary (Signed)
Discharge Summary   Patient ID: Lucas [name],  MRN: [id number], DOB/AGE: [birth year] 69 y.o.  Admit date: 08/19/2014 Discharge date: 08/22/2014  Primary Care Provider: Minerva Ends Primary Cardiologist: Dr. Johnsie Cancel  Discharge Diagnoses Principal Problem:   Acute on chronic systolic and diastolic heart failure, NYHA class 4 Active Problems:   Essential hypertension   Obesity   OSA (obstructive sleep apnea)   PAD (peripheral artery disease)   COPD, moderate   Chronic pain   PAF (paroxysmal atrial fibrillation)   CAD in native artery   Chronic kidney disease, stage 2, mildly decreased GFR   Chronic anticoagulation   HTN (hypertension)   Allergies Allergies  Allergen Reactions  . Aspirin Other (See Comments)    Causes nose bleeds  . Ativan [Lorazepam] Other (See Comments)    Increases agitation, tolerates xanax  . Lisinopril Cough  . Morphine And Related Other (See Comments)    Hallucinations, too sedated when given with ativan  . Ibuprofen Hives, Nausea And Vomiting and Rash    Hospital Course  The patient is a 69 year old male who was previously followed by Dr. Johnsie Cancel. He is chronically ill with multiple issues including COPD with chronic respiratory failure on home oxygen and nitroglycerin, chronic systolic heart failure with EF 40-45%, remote renal cancer with past nephrectomy and the CKD stage III, CAD status post CABG with patent LIMA to LAD and SVG to OM 3, but all other SVG remaining occluded. He has significant issue was anxiety and paroxysmal atrial fibrillation and long-standing tobacco abuse. He had a numerous hospitalization for atypical chest pain. He was seen in the cardiology clinic on 08/19/2014 after referred by his vascular surgeon after left lower extremity wound dressing change. On exam, he has significant lower extremity swelling. He was admitted from the office for IV diuresis.  Overnight, he had mild troponin elevation near upper border of normal.  There is no clear trend. EKG showed no significant changes. Per patient, his symptom was not typical of his normal angina like symptoms. He was diuresed aggressively with 80 mg BID Lasix. His IV Lasix was held on 08/21/2014 after mild Cr bump. At this point, he continued to have significant lower extremity swelling which may have been chronic for him. There is no rale on exam, however he does have markedly diminished breath sound with prolonged expiratory phase, wheezing on exam. He is on chronic prednisone with multiple inhaler. He is also on daily dose of Levaquin. Due to his significant right lower extremity pain, he also demanded increased dose of pain medication. He has been receiving every 2 hours of Dilaudid. He was seen by vascular surgery on 08/21/2014 as part of the continuous follow-up from his recent office visit.  He does have significant right lower extremity edema, however has good PT pulses on Doppler. Per vascular surgery, he is deemed stable for discharge from vascular perspective. We have arranged for wet-to-dry dressing changes at home with home health. We will also continue him on Levaquin until his follow-up with Dr. Trula Slade. He has a previously scheduled follow-up with Dr. Johnsie Cancel in one month.    Discharge Vitals Blood pressure 127/66, pulse 92, temperature 98.2 F (36.8 C), temperature source Oral, resp. rate 18, height 5\' 8"  (1.727 m), weight 223 lb 12.3 oz (101.5 kg), SpO2 91 %.  Filed Weights   08/20/14 0440 08/21/14 0405 08/22/14 0425  Weight: 222 lb 4.8 oz (100.835 kg) 224 lb 3.3 oz (101.7 kg) 223 lb 12.3 oz (101.5 kg)  Labs  CBC No results for input(s): WBC, NEUTROABS, HGB, HCT, MCV, PLT in the last 72 hours. Basic Metabolic Panel  Recent Labs  08/21/14 0523 08/22/14 0527  NA 136 138  K 3.6 3.7  CL 100 98  CO2 26 29  GLUCOSE 100* 102*  BUN 42* 39*  CREATININE 1.72* 1.56*  CALCIUM 8.5 8.9   Liver Function Tests No results for input(s): AST, ALT, ALKPHOS,  BILITOT, PROT, ALBUMIN in the last 72 hours. Cardiac Enzymes  Recent Labs  08/19/14 2231 08/22/14 1008 08/22/14 1319  TROPONINI 0.04* 0.10* 0.04*   Thyroid Function Tests No results for input(s): TSH, T4TOTAL, T3FREE, THYROIDAB in the last 72 hours.  Invalid input(s): FREET3  Disposition  Pt is being discharged home today in good condition.  Follow-up Plans & Appointments      Follow-up Information    Follow up with Jenkins Rouge, MD On 09/23/2014.   Specialty:  Cardiology   Why:  8:00am   Contact information:   [address] [place] [address] [phone]       Follow up with Minerva Ends, MD On 08/27/2014.   Specialty:  Family Medicine   Why:  [facility] followup for cellulitis   Contact information:   [address] [address] [phone]       Follow up with PARRETT,TAMMY, NP On 09/09/2014.   Specialty:  Nurse Practitioner   Why:  Pulmonology followup. 9:00am   Contact information:   [address] [place] [address] [phone]       Follow up with Eldridge Abrahams, MD On 09/04/2014.   Specialty:  Vascular Surgery   Why:  For wound re-check. 9:00am   Contact information:   [address] [place] [address] [phone]       Follow up with [place].   Why:  HH-RN arranged   Contact information:   [address] [place]  [address] [phone]       Discharge Medications    Medication List    STOP taking these medications        Fluticasone Furoate-Vilanterol 100-25 MCG/INH Aepb  Commonly known as:  BREO ELLIPTA     guaiFENesin 600 MG 12 hr tablet  Commonly known as:  MUCINEX     pantoprazole 40 MG tablet  Commonly known as:  PROTONIX      TAKE these medications        albuterol (2.5 MG/3ML) 0.083% nebulizer solution  Commonly known as:  PROVENTIL  Take 3 mLs (2.5 mg total) by nebulization 4 (four) times daily. Dx J44.1     albuterol 108  (90 BASE) MCG/ACT inhaler  Commonly known as:  PROVENTIL HFA;VENTOLIN HFA  Inhale 2 puffs into the lungs every 6 (six) hours as needed for wheezing or shortness of breath. Shortness of breath     ALPRAZolam 1 MG tablet  Commonly known as:  XANAX  Take 1 tablet (1 mg total) by mouth 3 (three) times daily.     apixaban 5 MG Tabs tablet  Commonly known as:  ELIQUIS  Take 1 tablet (5 mg total) by mouth 2 (two) times daily.     doxazosin 8 MG tablet  Commonly known as:  CARDURA  Take 1 tablet (8 mg total) by mouth at bedtime.     furosemide 40 MG tablet  Commonly known as:  LASIX  Take 1 tablet (40 mg total) by mouth 2 (two) times  daily.     HYDROcodone-acetaminophen 10-325 MG per tablet  Commonly known as:  NORCO  Take 1 tablet by mouth 3 (three) times daily.     HYDROmorphone 4 MG tablet  Commonly known as:  DILAUDID  Take 1 tablet (4 mg total) by mouth every 4 (four) hours as needed for moderate pain.     Ipratropium-Albuterol 20-100 MCG/ACT Aers respimat  Commonly known as:  COMBIVENT RESPIMAT  Inhale 1 puff into the lungs 4 (four) times daily.     levofloxacin 750 MG tablet  Commonly known as:  LEVAQUIN  Take 1 tablet (750 mg total) by mouth daily.     mometasone-formoterol 100-5 MCG/ACT Aero  Commonly known as:  DULERA  Inhale 2 puffs into the lungs 2 (two) times daily.     moxifloxacin 400 MG tablet  Commonly known as:  AVELOX  Take 1 tablet (400 mg total) by mouth daily at 8 pm.     nitroGLYCERIN 0.3 MG SL tablet  Commonly known as:  NITROSTAT  Place 0.3 mg under the tongue every 5 (five) minutes as needed for chest pain.     predniSONE 10 MG tablet  Commonly known as:  DELTASONE  Take 50 mg tablet today and taper down by 10 mg daily until completed         Duration of Discharge Encounter   Greater than 30 minutes including physician time.  Hilbert Corrigan PA-C Pager: [phone] 08/22/2014, 2:20 PM

## 2014-08-22 NOTE — Progress Notes (Signed)
Patient Name: Lucas [name] Date of Encounter: 08/22/2014  Primary cardiologist: Dr. Johnsie Cancel   Active Problems:   Acute on chronic systolic and diastolic heart failure, NYHA class 4    SUBJECTIVE  SOB only mildly improved. Need frequent pain med include dilaudid and Norco. Getting dilaudid every 2 hours  CURRENT MEDS . ALPRAZolam  1 mg Oral TID  . apixaban  5 mg Oral BID  . doxazosin  8 mg Oral Daily  . furosemide  40 mg Oral BID  . guaiFENesin  1,200 mg Oral BID  . ipratropium-albuterol  3 mL Nebulization QID  . levofloxacin  750 mg Oral Daily  . mometasone-formoterol  2 puff Inhalation BID  . nicotine  21 mg Transdermal Daily  . pantoprazole  40 mg Oral Daily  . predniSONE  10 mg Oral Q breakfast  . sodium chloride  3 mL Intravenous Q12H    OBJECTIVE  Filed Vitals:   08/21/14 1945 08/21/14 2256 08/22/14 0425 08/22/14 0918  BP: 131/69  143/81   Pulse: 70  77   Temp: 98.5 F (36.9 C)  98.1 F (36.7 C)   TempSrc: Oral  Oral   Resp: 18  20   Height:      Weight:   223 lb 12.3 oz (101.5 kg)   SpO2: 92% 93% 95% 94%    Intake/Output Summary (Last 24 hours) at 08/22/14 1139 Last data filed at 08/22/14 0730  Gross per 24 hour  Intake    360 ml  Output    750 ml  Net   -390 ml   Filed Weights   08/20/14 0440 08/21/14 0405 08/22/14 0425  Weight: 222 lb 4.8 oz (100.835 kg) 224 lb 3.3 oz (101.7 kg) 223 lb 12.3 oz (101.5 kg)    PHYSICAL EXAM  General: Pleasant, NAD. Neuro: Alert and oriented X 3. Moves all extremities spontaneously. Psych: Normal affect. HEENT:  Normal  Neck: Supple without bruits or JVD. Lungs:  Resp regular and unlabored. Markedly decreased breath sound bilaterally, with prolonged expiratory phase and wheezing and rhonchi. No rale Heart: RRR no s3, s4, or murmurs. Abdomen: Soft, non-tender, non-distended, BS + x 4.  Extremities: No clubbing, cyanosis. Radials 2+ and equal bilaterally. 1-2+ pitting edema bilaterally. LLE wound continue to  drain, appears to be tense. Dressing likely need to be changed soon. R femoral wound no drainage  Accessory Clinical Findings  CBC  Recent Labs  08/19/14 1408  WBC 12.1*  NEUTROABS 10.8*  HGB 13.7  HCT 43.7  MCV 93.4  PLT 503   Basic Metabolic Panel  Recent Labs  08/19/14 1408  08/21/14 0523 08/22/14 0527  NA 139  < > 136 138  K 4.1  < > 3.6 3.7  CL 104  < > 100 98  CO2 25  < > 26 29  GLUCOSE 160*  < > 100* 102*  BUN 39*  < > 42* 39*  CREATININE 1.60*  < > 1.72* 1.56*  CALCIUM 9.1  < > 8.5 8.9  MG 2.4  --   --   --   < > = values in this interval not displayed. Liver Function Tests  Recent Labs  08/19/14 1408  AST 24  ALT 25  ALKPHOS 56  BILITOT 0.6  PROT 7.4  ALBUMIN 4.2   Cardiac Enzymes  Recent Labs  08/19/14 2027 08/19/14 2231 08/22/14 1008  TROPONINI 0.05* 0.04* 0.10*   Thyroid Function Tests  Recent Labs  08/19/14 1408  TSH 0.802  TELE NSR with frequent PACs with HR 50s    ECG  No new EKG  Echocardiogram 08/10/2014  LV EF: 40% -  45%  ------------------------------------------------------------------- Indications:   Dyspnea 786.09.  ------------------------------------------------------------------- History:  PMH: Obesity  Coronary artery disease. Chronic obstructive pulmonary disease. Risk factors: Current tobacco use. Hypertension.  ------------------------------------------------------------------- Study Conclusions  - Left ventricle: The cavity size was normal. There was mild concentric hypertrophy. Systolic function was mildly to moderately reduced. The estimated ejection fraction was in the range of 40% to 45%. Probable hypokinesis of the apicalanteroseptal myocardium. Features are consistent with a pseudonormal left ventricular filling pattern, with concomitant abnormal relaxation and increased filling pressure (grade 2 diastolic dysfunction). - Aortic valve: There was mild to moderate  regurgitation directed centrally in the LVOT. - Left atrium: The atrium was moderately to severely dilated    Radiology/Studies  Dg Chest 1 View  07/30/2014   CLINICAL DATA:  Difficulty breathing  EXAM: CHEST - 1 VIEW  COMPARISON:  Chest radiograph May 13, 2014 and chest CT May 14, 2014  FINDINGS: There is underlying emphysematous change. There is atelectatic change in the right base. There is no frank edema or consolidation. Heart is enlarged with pulmonary vascularity within normal limits. Patient is status post coronary artery bypass grafting. No pneumothorax. No adenopathy.  IMPRESSION: Atelectatic change right base. No edema or consolidation. Underlying emphysema. Stable cardiac enlargement.   Electronically Signed   By: Lowella Grip M.D.   On: 07/30/2014 07:44   Dg Chest 2 View  08/20/2014   CLINICAL DATA:  Shortness of breath, history of CHF  EXAM: CHEST  2 VIEW  COMPARISON:  PA and lateral chest x-ray of August 09, 2014  FINDINGS: The lungs are mildly hyperinflated with hemidiaphragm flattening. The interstitial markings are mildly prominent but stable. There is stable scarring or atelectasis posteriorly in the right lower lobe. There is no alveolar pneumonia. The cardiopericardial silhouette is enlarged. There are post CABG changes. The pulmonary vascularity is not engorged. There is mild tortuosity of the descending thoracic aorta.  IMPRESSION: COPD with mild chronic increase in the pulmonary interstitial markings. There is no focal pneumonia nor overt evidence of pulmonary edema.   Electronically Signed   By: David  Martinique   On: 08/20/2014 07:53   Dg Chest 2 View  08/09/2014   CLINICAL DATA:  Shortness of breath, chest discomfort for 3 days.  EXAM: CHEST  2 VIEW  COMPARISON:  July 30, 2014.  FINDINGS: Stable cardiomediastinal silhouette. Status post coronary artery bypass graft. No pneumothorax or pleural effusion is noted. No acute pulmonary disease is noted. Bony thorax  is intact.  IMPRESSION: No acute cardiopulmonary abnormality seen.   Electronically Signed   By: Sabino Dick M.D.   On: 08/09/2014 11:35    ASSESSMENT AND PLAN  1. Acute on chronic respiratory failure -progressive dyspnea - most likely multifactorial from acute on chronic combined systolic/diastolic HF and COPD - continues to smoke   - Cr stable after holding IV lasix  - I/O net -3.5L, weight not accurate  - he likely has chronic LE edema. His COPD is likely responsible for significant portion of SOB, still has significant wheezing despite on chronic prednisone, frequent duoneb, levaquin, dulera (he has very bad COPD at baseline), unclear if pulmonology would have anything else to offer.   - PO lasix started. HF stable. Will discuss with MD regarding CP. If no further diagnostic workup by vascular, potential discharge soon. Need for pain med problematic  2. Acute on chronic systolic HF - EF of 40 to 45%  3. CAD - some chest pain overnight, per pt, does not feels likely previous MI. Trop ordered by Vascular only mildly elevated  - last echo 08/10/2014 EF 40-45%, probable hypokinesis of apical anteroseptal myocardium, grade 2 diastolic dysfunction  - Echo 05/14/2014 EF 32-35%, grade 1 diastolic dysfunction  - EKG unchanged. Trop mildly elevated this morning. Not ideal candidate for invasive workup given result function  - last cath done under general anesthesia given his high narcotic tolerance. 04/19/2014 EF was 45%, LVEDP 18, 50% ostial L main, patent LIMA to LAD, severe diseased native mid LAD, patent SVG to OM with mild proximal disease, severe disease in the native left circumflex and its branches, occluded native RCA with left-to-right collateral filling distal RCA. Medical therapy recommended   4. Tobacco abuse - Add back nicoderm patch  5. HTN   6. PVD - with recent surgery R fem-pop 08/01/14 - slow and delayed healing   - on chronic prednisone.   - RLE tense, seen by Dr. Russella Dar,  apparently good PT doppler signal.   - Given the presence of PT signal, low suspicion for compartment syndrome, however still pain out of proportion.   7. Chronic steroid use.   8. H/o PAF: on eliquis  9. Mildly elevated trop: no clear trend, likely demand ischemia in the setting of acute on chronic resp failure and COPD  Signed, Woodward Ku Pager: [phone]  I have seen and examined the patient along with Almyra Deforest, PA.  I have reviewed the chart, notes and new data.  I agree with PA's note.  Key new complaints: pain control is the prevailing issue; he is walking the hall without O2 and feels comfortable Key examination changes: no moist rales, no edema Key new findings / data: stable weight, matched in/out on current regimen, little change in creatinine.  PLAN: I think he is ready for DC from cardiac standpoint, unless further plans from Vascular Surgery.  Sanda Klein, MD, [place] [phone] 08/22/2014, 12:52 PM

## 2014-08-22 NOTE — Progress Notes (Signed)
  Progress Note    08/22/2014 7:38 AM POD 27  Subjective:  "my leg is throbbing"  afebrile  Filed Vitals:   08/22/14 0425  BP: 143/81  Pulse: 77  Temp: 98.1 F (36.7 C)  Resp: 20    Physical Exam: Extremities:  Dressing in place right below knee incision   CBC    Component Value Date/Time   WBC 12.1* 08/19/2014 1408   RBC 4.68 08/19/2014 1408   HGB 13.7 08/19/2014 1408   HCT 43.7 08/19/2014 1408   PLT 218 08/19/2014 1408   MCV 93.4 08/19/2014 1408   MCH 29.3 08/19/2014 1408   MCHC 31.4 08/19/2014 1408   RDW 16.7* 08/19/2014 1408   LYMPHSABS 0.5* 08/19/2014 1408   MONOABS 0.7 08/19/2014 1408   EOSABS 0.0 08/19/2014 1408   BASOSABS 0.0 08/19/2014 1408    BMET    Component Value Date/Time   NA 136 08/21/2014 0523   K 3.6 08/21/2014 0523   CL 100 08/21/2014 0523   CO2 26 08/21/2014 0523   GLUCOSE 100* 08/21/2014 0523   BUN 42* 08/21/2014 0523   CREATININE 1.72* 08/21/2014 0523   CREATININE 1.19 05/03/2014 1159   CALCIUM 8.5 08/21/2014 0523   GFRNONAA 39* 08/21/2014 0523   GFRAA 45* 08/21/2014 0523    INR    Component Value Date/Time   INR 1.14 08/19/2014 1408     Intake/Output Summary (Last 24 hours) at 08/22/14 0738 Last data filed at 08/21/14 1945  Gross per 24 hour  Intake      0 ml  Output    400 ml  Net   -400 ml     Assessment:  69 y.o. male is s/p:  Right common femoral to below knee popliteal artery bypass graft with 6 mm external ring propaten POD 27  Plan: -pt is having chest pressure - I have ordered an EKG and troponins stat and asked the RN to contact cardiology -dressing packed in below knee incision.  Pt says it hasn't been changed since Monday.  Will return with Dr. Trula Slade to change dressing and RN can give pain medication before dressing change. -DVT prophylaxis-Eliquis    Leontine Locket, PA-C [facility] [phone] 08/22/2014 7:38 AM    I agree with the above.  I have seen and evaluated the  patient.  His dressing was changed today.  He will require wet-to-dry dressing changes at home which will be arranged with home health.  I would also continue him on antibiotics with plans for follow-up with me in 2 weeks  Wells Rilynn Habel

## 2014-08-22 NOTE — Progress Notes (Signed)
    Subjective  -   Pt admitted for CHF exacerbation C/o  right leg pain   Physical Exam:  Dressing chaged.  Wound continues to drain Redness in right leg much improved Still with edema Good PT doppler signal      Assessment/Plan:    Continue with BID wet to dry dressing changes to right leg wound Continue ABX  Cali Hope IV, V. WELLS 08/22/2014 7:53 AM --  Filed Vitals:   08/22/14 0425  BP: 143/81  Pulse: 77  Temp: 98.1 F (36.7 C)  Resp: 20    Intake/Output Summary (Last 24 hours) at 08/22/14 0753 Last data filed at 08/21/14 1945  Gross per 24 hour  Intake      0 ml  Output    400 ml  Net   -400 ml     Laboratory CBC    Component Value Date/Time   WBC 12.1* 08/19/2014 1408   HGB 13.7 08/19/2014 1408   HCT 43.7 08/19/2014 1408   PLT 218 08/19/2014 1408    BMET    Component Value Date/Time   NA 136 08/21/2014 0523   K 3.6 08/21/2014 0523   CL 100 08/21/2014 0523   CO2 26 08/21/2014 0523   GLUCOSE 100* 08/21/2014 0523   BUN 42* 08/21/2014 0523   CREATININE 1.72* 08/21/2014 0523   CREATININE 1.19 05/03/2014 1159   CALCIUM 8.5 08/21/2014 0523   GFRNONAA 39* 08/21/2014 0523   GFRAA 45* 08/21/2014 0523    COAG Lab Results  Component Value Date   INR 1.14 08/19/2014   INR 1.25 08/09/2014   INR 1.10 07/23/2014   No results found for: PTT  Antibiotics Anti-infectives    Start     Dose/Rate Route Frequency Ordered Stop   08/19/14 1100  levofloxacin (LEVAQUIN) tablet 750 mg     750 mg Oral Daily 08/19/14 1055         V. Leia Alf, M.D. [facility] [place] Office: [phone] Pager:  [phone]

## 2014-08-23 ENCOUNTER — Inpatient Hospital Stay: Payer: 59 | Admitting: Family Medicine

## 2014-08-23 ENCOUNTER — Telehealth: Payer: Self-pay | Admitting: Surgery

## 2014-08-23 NOTE — Telephone Encounter (Signed)
-----   Message from Mena Goes, RN sent at 08/22/2014  2:25 PM EST ----- Regarding: schedule   ----- Message -----    From: Serafina Mitchell, MD    Sent: 08/22/2014   1:25 PM      To: [facility] Charge Pool  Patient should have follow-up with me in 23 weeks since I saw him this past Monday.  He was admitted to the hospital and is being discharged home today.  Please confirm that he is can see me back in the office within the next 2-3 weeks

## 2014-08-27 ENCOUNTER — Ambulatory Visit: Payer: Medicare Other | Attending: Family Medicine | Admitting: Family Medicine

## 2014-08-27 ENCOUNTER — Encounter: Payer: Self-pay | Admitting: Family Medicine

## 2014-08-27 DIAGNOSIS — F17200 Nicotine dependence, unspecified, uncomplicated: Secondary | ICD-10-CM | POA: Insufficient documentation

## 2014-08-27 DIAGNOSIS — Y832 Surgical operation with anastomosis, bypass or graft as the cause of abnormal reaction of the patient, or of later complication, without mention of misadventure at the time of the procedure: Secondary | ICD-10-CM | POA: Insufficient documentation

## 2014-08-27 DIAGNOSIS — S71101A Unspecified open wound, right thigh, initial encounter: Secondary | ICD-10-CM | POA: Insufficient documentation

## 2014-08-27 DIAGNOSIS — I5022 Chronic systolic (congestive) heart failure: Secondary | ICD-10-CM | POA: Insufficient documentation

## 2014-08-27 DIAGNOSIS — L03115 Cellulitis of right lower limb: Secondary | ICD-10-CM

## 2014-08-27 DIAGNOSIS — T814XXA Infection following a procedure, initial encounter: Secondary | ICD-10-CM | POA: Insufficient documentation

## 2014-08-27 DIAGNOSIS — A4902 Methicillin resistant Staphylococcus aureus infection, unspecified site: Secondary | ICD-10-CM | POA: Insufficient documentation

## 2014-08-27 DIAGNOSIS — Y929 Unspecified place or not applicable: Secondary | ICD-10-CM | POA: Diagnosis not present

## 2014-08-27 LAB — BASIC METABOLIC PANEL
BUN: 26 mg/dL — ABNORMAL HIGH (ref 6–23)
CO2: 24 mEq/L (ref 19–32)
Calcium: 9.6 mg/dL (ref 8.4–10.5)
Chloride: 104 mEq/L (ref 96–112)
Creat: 1.19 mg/dL (ref 0.50–1.35)
Glucose, Bld: 100 mg/dL — ABNORMAL HIGH (ref 70–99)
Potassium: 4.7 mEq/L (ref 3.5–5.3)
Sodium: 141 mEq/L (ref 135–145)

## 2014-08-27 LAB — CBC
HCT: 39.6 % (ref 39.0–52.0)
Hemoglobin: 12.8 g/dL — ABNORMAL LOW (ref 13.0–17.0)
MCH: 28.6 pg (ref 26.0–34.0)
MCHC: 32.3 g/dL (ref 30.0–36.0)
MCV: 88.6 fL (ref 78.0–100.0)
MPV: 10.5 fL (ref 8.6–12.4)
PLATELETS: 178 10*3/uL (ref 150–400)
RBC: 4.47 MIL/uL (ref 4.22–5.81)
RDW: 16.9 % — ABNORMAL HIGH (ref 11.5–15.5)
WBC: 11 10*3/uL — AB (ref 4.0–10.5)

## 2014-08-27 NOTE — Patient Instructions (Addendum)
Mr. Cech,  Thank you for coming in today.  1. R leg wound following fem-pop bypass. Wound cleaned and dressed today. Please restart Levaquin, crush and take with pudding or applesauce Keep leg elevated.  Take lasix.  Your wound care nurse is Tiffany [phone]   2. Recent CHF exacerbation: Lungs w/o crackles today.  Keep track of your weight.  Continue lasix.   You will be called with CBC and BMP results  Dr. Adrian Blackwater

## 2014-08-27 NOTE — Progress Notes (Signed)
   Subjective:    Patient ID: Lucas [name], male    DOB: May 12, 1946, 69 y.o.   MRN: [id number] CC: HFU for cellulitis following R fem-pop bypass on 07/26/14.  HPI 69 yo M with multiple medical problems presents for HFU:  1. Cellulitis:  In R leg following fem-pop bypass on 07/26/14. Patient was admitted from 1/15-1/21/16 and again from 1/29-1/31 for cellulitis in his R leg. Wound culture from R calf from 08/09/14 grew out MRSA. Since discharge patient has been receiving home health wound care from [facility]. The wound was last cleaned yesterday. He has been instructed that his dressing changes are needed twice daily and that home health can only come out 3 x per week. He reports that he cannot change his own dressing due to pain and shaking hands. He reports that the combo of dilaudid plus percocet worked best in the hospital. He now has dilaudid and Vicodin.  Also he is not taking Levaquin since 08/24/14 because the pills are too big to swallow.   2. Acute on chronic CHF exacerbation: patient admitted for CHF exacerbation on 2/8-2/11/16. He was diuresed down to 102 kg which is his dry weight. He is taking lasix. He is weighing at home, 223 # this AM. He is elevating his leg. He denies chest pain. He admits to stable dyspnea requiring 2 L [place].   Soc Hx: currently smoking  Review of Systems As per HPI      Objective:   Physical Exam BP 152/75 mmHg  Pulse 65  Temp(Src) 98.6 F (37 C)  Resp 16  Ht 5' 8.5" (1.74 m)  Wt 227 lb 3.2 oz (103.057 kg)  BMI 34.04 kg/m2  SpO2 97% General appearance: alert, cooperative, no distress and mildly obese Lungs: increased work of breath with prolonged exp phase, no crackles, no wheezing.  Heart: regular rate and rhythm, S1, S2 normal, no murmur, click, rub or gallop Extremities:  R groin 3 cm incision is open with scant drainage, mild erythema. Mild TTP. No fluctuance.   R calf 2 cm x 9 mm wound with serous drainage.Marland Kitchen Extends down into  subcutaneous tissue, 1 cm deep.  Mild erythema around wound, non streaking.  Wound cleaned with sterile saline. Mild degree of undermining superiorly ~ 5 mm. Wound packed with iodoform guaze and covered with 4x4 and tape.       Assessment & Plan:

## 2014-08-27 NOTE — Assessment & Plan Note (Signed)
A:  R leg wound following fem-pop bypass. Improving per patient report.  P: Wound cleaned and dressed today. Please restart Levaquin, crush and take with pudding or applesauce Keep leg elevated.  Take lasix.  [facility] care nurse at [facility] is Jonelle Sidle [phone]

## 2014-08-27 NOTE — Progress Notes (Signed)
Patient c/o right leg pain-wound is draining Patient has not been taking his Levaquin because they are too big

## 2014-08-27 NOTE — Assessment & Plan Note (Signed)
Recent CHF exacerbation: Lungs w/o crackles today. Slightly above dry weight by office scale, patient reports weight at home was 223#.  P:  Keep track of your weight.  Continue lasix.  BMP today.

## 2014-08-29 ENCOUNTER — Telehealth: Payer: Self-pay | Admitting: *Deleted

## 2014-08-29 NOTE — Telephone Encounter (Signed)
Lucas [name] called today with concerns that his dressing was not being changed daily. He is "unable to replace the packing in the wound due to pain and shakiness of his hands". He is afebrile and his drainage is clear.  His [facility] nurse, Lucas [name] [phone]), is coming out 3 x weekly. She is packing the wound with hydrogel soaked gauze covered with dry dressing as per our orders. I clarified with the patient that he did not need to change the dressing himself unless it becomes dirty or soaked. He says that he is taking his Levaquin and Lasix according to Lucas [name] orders given on 08-27-14. He will see Lucas [name] again on 09-04-14 for postop appt.

## 2014-08-29 NOTE — Telephone Encounter (Signed)
Pt aware of lab results 

## 2014-08-29 NOTE — Telephone Encounter (Signed)
-----   Message from Minerva Ends, MD sent at 08/28/2014  9:24 AM EST ----- downtrending WBC, finish levaquin for wound infection/cellulitis Normal BMP, K+ 4.7

## 2014-09-03 ENCOUNTER — Encounter: Payer: Self-pay | Admitting: Vascular Surgery

## 2014-09-04 ENCOUNTER — Encounter: Payer: Self-pay | Admitting: Surgery

## 2014-09-04 ENCOUNTER — Ambulatory Visit (INDEPENDENT_AMBULATORY_CARE_PROVIDER_SITE_OTHER): Payer: Self-pay | Admitting: Surgery

## 2014-09-04 ENCOUNTER — Other Ambulatory Visit: Payer: Self-pay | Admitting: *Deleted

## 2014-09-04 VITALS — BP 141/92 | HR 88 | Temp 98.5°F | Resp 99 | Ht 68.5 in | Wt 224.0 lb

## 2014-09-04 DIAGNOSIS — M6289 Other specified disorders of muscle: Secondary | ICD-10-CM

## 2014-09-04 DIAGNOSIS — T8189XD Other complications of procedures, not elsewhere classified, subsequent encounter: Secondary | ICD-10-CM

## 2014-09-04 DIAGNOSIS — T148XXA Other injury of unspecified body region, initial encounter: Principal | ICD-10-CM

## 2014-09-04 NOTE — Progress Notes (Signed)
  The patient is back today for a wound check. He is s/p R LE fem BK Pop BPG with PTFE for severe claudication / rest pain on 07/26/2014.  I had previously open an area in his BK incision.  This was getting dressing changes.  He still complains of a lot of pain in his right leg, but does say he is able to walk further without any pain.  ON exam: He has a triphasic PT doppler signal, biphasic peroneal and monophasic AT.  The wound continues to drain, and is a little larger.  I probed it today and could insert the q-tip about 3 cm.  I have recommended placing a wound vac to help with healing.  He continues to be on Abx for his recent COPD flair.  I will see him back in 3 weeks    Wells BRabham

## 2014-09-04 NOTE — Progress Notes (Signed)
Faxed face to face Medicare form and DME order for negative pressure wound therapy to [facility]  Fax [phone].   Ordered 120 mmhg negative pressure to right lower extremity wound ( medial calf) per Dr. Stephens Shire verbal order.  Also, spoke to [place] at [phone].

## 2014-09-06 ENCOUNTER — Inpatient Hospital Stay (HOSPITAL_COMMUNITY)
Admission: EM | Admit: 2014-09-06 | Discharge: 2014-09-11 | DRG: 863 | Disposition: A | Discharge status: Home / Self Care | Payer: Medicare Other | Attending: Surgery | Admitting: Surgery

## 2014-09-06 ENCOUNTER — Encounter (HOSPITAL_COMMUNITY): Payer: Self-pay | Admitting: Emergency Medicine

## 2014-09-06 ENCOUNTER — Telehealth: Payer: Self-pay

## 2014-09-06 ENCOUNTER — Emergency Department (HOSPITAL_COMMUNITY): Payer: Medicare Other

## 2014-09-06 DIAGNOSIS — T814XXA Infection following a procedure, initial encounter: Principal | ICD-10-CM | POA: Diagnosis present

## 2014-09-06 DIAGNOSIS — E785 Hyperlipidemia, unspecified: Secondary | ICD-10-CM | POA: Diagnosis present

## 2014-09-06 DIAGNOSIS — I129 Hypertensive chronic kidney disease with stage 1 through stage 4 chronic kidney disease, or unspecified chronic kidney disease: Secondary | ICD-10-CM | POA: Diagnosis present

## 2014-09-06 DIAGNOSIS — F17210 Nicotine dependence, cigarettes, uncomplicated: Secondary | ICD-10-CM | POA: Diagnosis present

## 2014-09-06 DIAGNOSIS — N400 Enlarged prostate without lower urinary tract symptoms: Secondary | ICD-10-CM | POA: Diagnosis present

## 2014-09-06 DIAGNOSIS — Z955 Presence of coronary angioplasty implant and graft: Secondary | ICD-10-CM | POA: Diagnosis not present

## 2014-09-06 DIAGNOSIS — Z7951 Long term (current) use of inhaled steroids: Secondary | ICD-10-CM

## 2014-09-06 DIAGNOSIS — I739 Peripheral vascular disease, unspecified: Secondary | ICD-10-CM | POA: Diagnosis present

## 2014-09-06 DIAGNOSIS — N183 Chronic kidney disease, stage 3 (moderate): Secondary | ICD-10-CM | POA: Diagnosis present

## 2014-09-06 DIAGNOSIS — Z7901 Long term (current) use of anticoagulants: Secondary | ICD-10-CM

## 2014-09-06 DIAGNOSIS — I5042 Chronic combined systolic (congestive) and diastolic (congestive) heart failure: Secondary | ICD-10-CM | POA: Diagnosis present

## 2014-09-06 DIAGNOSIS — F419 Anxiety disorder, unspecified: Secondary | ICD-10-CM | POA: Diagnosis present

## 2014-09-06 DIAGNOSIS — L03115 Cellulitis of right lower limb: Secondary | ICD-10-CM | POA: Diagnosis present

## 2014-09-06 DIAGNOSIS — I480 Paroxysmal atrial fibrillation: Secondary | ICD-10-CM | POA: Diagnosis present

## 2014-09-06 DIAGNOSIS — Y838 Other surgical procedures as the cause of abnormal reaction of the patient, or of later complication, without mention of misadventure at the time of the procedure: Secondary | ICD-10-CM | POA: Diagnosis present

## 2014-09-06 DIAGNOSIS — A419 Sepsis, unspecified organism: Secondary | ICD-10-CM

## 2014-09-06 DIAGNOSIS — J449 Chronic obstructive pulmonary disease, unspecified: Secondary | ICD-10-CM | POA: Diagnosis present

## 2014-09-06 DIAGNOSIS — J9610 Chronic respiratory failure, unspecified whether with hypoxia or hypercapnia: Secondary | ICD-10-CM | POA: Diagnosis present

## 2014-09-06 DIAGNOSIS — E876 Hypokalemia: Secondary | ICD-10-CM | POA: Diagnosis present

## 2014-09-06 DIAGNOSIS — Z905 Acquired absence of kidney: Secondary | ICD-10-CM | POA: Diagnosis present

## 2014-09-06 DIAGNOSIS — G8929 Other chronic pain: Secondary | ICD-10-CM | POA: Diagnosis present

## 2014-09-06 DIAGNOSIS — I255 Ischemic cardiomyopathy: Secondary | ICD-10-CM | POA: Diagnosis present

## 2014-09-06 DIAGNOSIS — Z9981 Dependence on supplemental oxygen: Secondary | ICD-10-CM | POA: Diagnosis not present

## 2014-09-06 DIAGNOSIS — Z8546 Personal history of malignant neoplasm of prostate: Secondary | ICD-10-CM | POA: Diagnosis not present

## 2014-09-06 DIAGNOSIS — R0600 Dyspnea, unspecified: Secondary | ICD-10-CM

## 2014-09-06 DIAGNOSIS — Z951 Presence of aortocoronary bypass graft: Secondary | ICD-10-CM | POA: Diagnosis not present

## 2014-09-06 DIAGNOSIS — L0390 Cellulitis, unspecified: Secondary | ICD-10-CM | POA: Diagnosis present

## 2014-09-06 LAB — CBC
HCT: 39.4 % (ref 39.0–52.0)
Hemoglobin: 12.8 g/dL — ABNORMAL LOW (ref 13.0–17.0)
MCH: 28.9 pg (ref 26.0–34.0)
MCHC: 32.5 g/dL (ref 30.0–36.0)
MCV: 88.9 fL (ref 78.0–100.0)
PLATELETS: 201 10*3/uL (ref 150–400)
RBC: 4.43 MIL/uL (ref 4.22–5.81)
RDW: 16.7 % — ABNORMAL HIGH (ref 11.5–15.5)
WBC: 11.1 10*3/uL — AB (ref 4.0–10.5)

## 2014-09-06 LAB — CBC WITH DIFFERENTIAL/PLATELET
BASOS ABS: 0 10*3/uL (ref 0.0–0.1)
Basophils Relative: 0 % (ref 0–1)
EOS ABS: 0.1 10*3/uL (ref 0.0–0.7)
Eosinophils Relative: 1 % (ref 0–5)
HCT: 38.4 % — ABNORMAL LOW (ref 39.0–52.0)
HEMOGLOBIN: 12.2 g/dL — AB (ref 13.0–17.0)
Lymphocytes Relative: 5 % — ABNORMAL LOW (ref 12–46)
Lymphs Abs: 0.5 10*3/uL — ABNORMAL LOW (ref 0.7–4.0)
MCH: 27.8 pg (ref 26.0–34.0)
MCHC: 31.8 g/dL (ref 30.0–36.0)
MCV: 87.5 fL (ref 78.0–100.0)
MONO ABS: 1 10*3/uL (ref 0.1–1.0)
Monocytes Relative: 9 % (ref 3–12)
Neutro Abs: 9.8 10*3/uL — ABNORMAL HIGH (ref 1.7–7.7)
Neutrophils Relative %: 85 % — ABNORMAL HIGH (ref 43–77)
PLATELETS: 192 10*3/uL (ref 150–400)
RBC: 4.39 MIL/uL (ref 4.22–5.81)
RDW: 16.8 % — AB (ref 11.5–15.5)
WBC: 11.4 10*3/uL — ABNORMAL HIGH (ref 4.0–10.5)

## 2014-09-06 LAB — COMPREHENSIVE METABOLIC PANEL
ALBUMIN: 3.6 g/dL (ref 3.5–5.2)
ALK PHOS: 60 U/L (ref 39–117)
ALT: 21 U/L (ref 0–53)
AST: 21 U/L (ref 0–37)
Anion gap: 8 (ref 5–15)
BUN: 23 mg/dL (ref 6–23)
CO2: 25 mmol/L (ref 19–32)
Calcium: 8.8 mg/dL (ref 8.4–10.5)
Chloride: 102 mmol/L (ref 96–112)
Creatinine, Ser: 1.49 mg/dL — ABNORMAL HIGH (ref 0.50–1.35)
GFR calc Af Amer: 53 mL/min — ABNORMAL LOW (ref >90)
GFR calc non Af Amer: 46 mL/min — ABNORMAL LOW (ref >90)
GLUCOSE: 99 mg/dL (ref 70–99)
POTASSIUM: 3.7 mmol/L (ref 3.5–5.1)
Sodium: 135 mmol/L (ref 135–145)
TOTAL PROTEIN: 6.9 g/dL (ref 6.0–8.3)
Total Bilirubin: 0.8 mg/dL (ref 0.3–1.2)

## 2014-09-06 LAB — PROTIME-INR
INR: 1.49 (ref 0.00–1.49)
Prothrombin Time: 18.2 seconds — ABNORMAL HIGH (ref 11.6–15.2)

## 2014-09-06 LAB — BASIC METABOLIC PANEL
Anion gap: 8 (ref 5–15)
BUN: 22 mg/dL (ref 6–23)
CHLORIDE: 104 mmol/L (ref 96–112)
CO2: 25 mmol/L (ref 19–32)
Calcium: 9.2 mg/dL (ref 8.4–10.5)
Creatinine, Ser: 1.41 mg/dL — ABNORMAL HIGH (ref 0.50–1.35)
GFR calc non Af Amer: 49 mL/min — ABNORMAL LOW (ref >90)
GFR, EST AFRICAN AMERICAN: 57 mL/min — AB (ref >90)
Glucose, Bld: 105 mg/dL — ABNORMAL HIGH (ref 70–99)
POTASSIUM: 3.8 mmol/L (ref 3.5–5.1)
Sodium: 137 mmol/L (ref 135–145)

## 2014-09-06 LAB — TROPONIN I
TROPONIN I: 0.05 ng/mL — AB (ref <0.031)
Troponin I: 0.05 ng/mL — ABNORMAL HIGH (ref <0.031)
Troponin I: 0.04 ng/mL — ABNORMAL HIGH (ref <0.031)

## 2014-09-06 LAB — I-STAT CG4 LACTIC ACID, ED
Lactic Acid, Venous: 0.75 mmol/L (ref 0.5–2.0)
Lactic Acid, Venous: 0.85 mmol/L (ref 0.5–2.0)

## 2014-09-06 MED ORDER — FUROSEMIDE 40 MG PO TABS
40.0000 mg | ORAL_TABLET | Freq: Two times a day (BID) | ORAL | Status: DC
  Administered 2014-09-06 – 2014-09-10 (×8): 40 mg via ORAL
  Filled 2014-09-06 (×11): qty 1

## 2014-09-06 MED ORDER — ALBUTEROL SULFATE HFA 108 (90 BASE) MCG/ACT IN AERS: 2.0000 | INHALATION_SPRAY | Freq: Four times a day (QID) | RESPIRATORY_TRACT | Status: DC | PRN

## 2014-09-06 MED ORDER — POTASSIUM CHLORIDE 10 MEQ/100ML IV SOLN
10.0000 meq | Freq: Once | INTRAVENOUS | Status: AC
  Administered 2014-09-06: 10 meq via INTRAVENOUS
  Filled 2014-09-06: qty 100

## 2014-09-06 MED ORDER — PIPERACILLIN-TAZOBACTAM 3.375 G IVPB
3.3750 g | Freq: Three times a day (TID) | INTRAVENOUS | Status: DC
  Administered 2014-09-06 – 2014-09-11 (×16): 3.375 g via INTRAVENOUS
  Filled 2014-09-06 (×20): qty 50

## 2014-09-06 MED ORDER — GUAIFENESIN-DM 100-10 MG/5ML PO SYRP: 15.0000 mL | ORAL_SOLUTION | ORAL | Status: DC | PRN

## 2014-09-06 MED ORDER — ALPRAZOLAM 0.5 MG PO TABS
1.0000 mg | ORAL_TABLET | Freq: Three times a day (TID) | ORAL | Status: DC
  Administered 2014-09-06 – 2014-09-11 (×16): 1 mg via ORAL
  Filled 2014-09-06 (×10): qty 2
  Filled 2014-09-06: qty 4
  Filled 2014-09-06 (×5): qty 2

## 2014-09-06 MED ORDER — HYDROMORPHONE HCL 1 MG/ML IJ SOLN
1.0000 mg | Freq: Once | INTRAMUSCULAR | Status: AC
  Administered 2014-09-06: 1 mg via INTRAVENOUS
  Filled 2014-09-06: qty 1

## 2014-09-06 MED ORDER — ACETAMINOPHEN 325 MG PO TABS
650.0000 mg | ORAL_TABLET | Freq: Once | ORAL | Status: DC
  Filled 2014-09-06: qty 2

## 2014-09-06 MED ORDER — PANTOPRAZOLE SODIUM 40 MG PO TBEC
40.0000 mg | DELAYED_RELEASE_TABLET | Freq: Every day | ORAL | Status: DC
  Administered 2014-09-08 – 2014-09-10 (×3): 40 mg via ORAL
  Filled 2014-09-06 (×5): qty 1

## 2014-09-06 MED ORDER — NITROGLYCERIN 0.4 MG SL SUBL: 0.4000 mg | SUBLINGUAL_TABLET | SUBLINGUAL | Status: DC | PRN

## 2014-09-06 MED ORDER — ONDANSETRON HCL 4 MG/2ML IJ SOLN: 4.0000 mg | Freq: Four times a day (QID) | INTRAMUSCULAR | Status: DC | PRN

## 2014-09-06 MED ORDER — VANCOMYCIN HCL IN DEXTROSE 750-5 MG/150ML-% IV SOLN
750.0000 mg | Freq: Two times a day (BID) | INTRAVENOUS | Status: DC
  Administered 2014-09-07 – 2014-09-08 (×3): 750 mg via INTRAVENOUS
  Filled 2014-09-06 (×7): qty 150

## 2014-09-06 MED ORDER — HYDRALAZINE HCL 20 MG/ML IJ SOLN: 5.0000 mg | INTRAMUSCULAR | Status: DC | PRN

## 2014-09-06 MED ORDER — HYDROCODONE-ACETAMINOPHEN 10-325 MG PO TABS
1.0000 | ORAL_TABLET | Freq: Every day | ORAL | Status: DC
  Administered 2014-09-06 – 2014-09-11 (×24): 1 via ORAL
  Filled 2014-09-06 (×24): qty 1

## 2014-09-06 MED ORDER — VANCOMYCIN HCL 10 G IV SOLR
1500.0000 mg | Freq: Once | INTRAVENOUS | Status: AC
  Administered 2014-09-06: 1500 mg via INTRAVENOUS
  Filled 2014-09-06: qty 1500

## 2014-09-06 MED ORDER — MOMETASONE FURO-FORMOTEROL FUM 100-5 MCG/ACT IN AERO
2.0000 | INHALATION_SPRAY | Freq: Two times a day (BID) | RESPIRATORY_TRACT | Status: DC
  Administered 2014-09-06 – 2014-09-09 (×5): 2 via RESPIRATORY_TRACT
  Filled 2014-09-06: qty 8.8

## 2014-09-06 MED ORDER — APIXABAN 5 MG PO TABS
5.0000 mg | ORAL_TABLET | Freq: Two times a day (BID) | ORAL | Status: DC
  Administered 2014-09-06 – 2014-09-11 (×10): 5 mg via ORAL
  Filled 2014-09-06 (×12): qty 1

## 2014-09-06 MED ORDER — IPRATROPIUM-ALBUTEROL 0.5-2.5 (3) MG/3ML IN SOLN
3.0000 mL | Freq: Four times a day (QID) | RESPIRATORY_TRACT | Status: DC
  Administered 2014-09-06 – 2014-09-09 (×8): 3 mL via RESPIRATORY_TRACT
  Filled 2014-09-06 (×8): qty 3

## 2014-09-06 MED ORDER — IPRATROPIUM-ALBUTEROL 20-100 MCG/ACT IN AERS: 1.0000 | INHALATION_SPRAY | Freq: Four times a day (QID) | RESPIRATORY_TRACT | Status: DC

## 2014-09-06 MED ORDER — PREDNISONE 10 MG PO TABS: 10.0000 mg | ORAL_TABLET | ORAL | Status: DC

## 2014-09-06 MED ORDER — CETYLPYRIDINIUM CHLORIDE 0.05 % MT LIQD
7.0000 mL | Freq: Two times a day (BID) | OROMUCOSAL | Status: DC
  Administered 2014-09-06 – 2014-09-11 (×9): 7 mL via OROMUCOSAL

## 2014-09-06 MED ORDER — LABETALOL HCL 5 MG/ML IV SOLN
10.0000 mg | INTRAVENOUS | Status: DC | PRN
  Filled 2014-09-06: qty 4

## 2014-09-06 MED ORDER — METOPROLOL TARTRATE 1 MG/ML IV SOLN: 2.0000 mg | INTRAVENOUS | Status: DC | PRN

## 2014-09-06 MED ORDER — LEVOFLOXACIN 750 MG PO TABS
750.0000 mg | ORAL_TABLET | Freq: Every day | ORAL | Status: DC
  Filled 2014-09-06 (×2): qty 1

## 2014-09-06 MED ORDER — POTASSIUM CHLORIDE CRYS ER 20 MEQ PO TBCR
20.0000 meq | EXTENDED_RELEASE_TABLET | Freq: Once | ORAL | Status: DC
  Filled 2014-09-06: qty 1

## 2014-09-06 MED ORDER — IPRATROPIUM-ALBUTEROL 0.5-2.5 (3) MG/3ML IN SOLN: 3.0000 mL | RESPIRATORY_TRACT | Status: DC

## 2014-09-06 MED ORDER — DOXAZOSIN MESYLATE 8 MG PO TABS
8.0000 mg | ORAL_TABLET | Freq: Every day | ORAL | Status: DC
  Administered 2014-09-06 – 2014-09-10 (×5): 8 mg via ORAL
  Filled 2014-09-06 (×7): qty 1

## 2014-09-06 MED ORDER — HYDROMORPHONE HCL 1 MG/ML IJ SOLN
0.5000 mg | INTRAMUSCULAR | Status: DC | PRN
  Administered 2014-09-06 – 2014-09-08 (×12): 1 mg via INTRAVENOUS
  Filled 2014-09-06 (×12): qty 1

## 2014-09-06 MED ORDER — NITROGLYCERIN 0.3 MG SL SUBL: 0.3000 mg | SUBLINGUAL_TABLET | SUBLINGUAL | Status: DC | PRN

## 2014-09-06 MED ORDER — PREDNISONE 10 MG PO TABS
10.0000 mg | ORAL_TABLET | Freq: Every day | ORAL | Status: DC
  Administered 2014-09-07 – 2014-09-11 (×5): 10 mg via ORAL
  Filled 2014-09-06 (×6): qty 1

## 2014-09-06 MED ORDER — SODIUM CHLORIDE 0.9 % IV SOLN
INTRAVENOUS | Status: DC
  Administered 2014-09-06: 17:00:00 via INTRAVENOUS

## 2014-09-06 MED ORDER — HYDROMORPHONE HCL 2 MG PO TABS
4.0000 mg | ORAL_TABLET | ORAL | Status: DC | PRN
  Administered 2014-09-06 – 2014-09-08 (×5): 4 mg via ORAL
  Filled 2014-09-06 (×5): qty 2

## 2014-09-06 MED ORDER — ALBUTEROL SULFATE (2.5 MG/3ML) 0.083% IN NEBU
2.5000 mg | INHALATION_SOLUTION | RESPIRATORY_TRACT | Status: DC | PRN
  Administered 2014-09-07 – 2014-09-11 (×2): 2.5 mg via RESPIRATORY_TRACT
  Filled 2014-09-06 (×2): qty 3

## 2014-09-06 MED ORDER — ALUM & MAG HYDROXIDE-SIMETH 200-200-20 MG/5ML PO SUSP: 15.0000 mL | ORAL | Status: DC | PRN

## 2014-09-06 MED ORDER — PHENOL 1.4 % MT LIQD
1.0000 | OROMUCOSAL | Status: DC | PRN
  Filled 2014-09-06: qty 177

## 2014-09-06 MED ORDER — ALBUTEROL SULFATE (2.5 MG/3ML) 0.083% IN NEBU
2.5000 mg | INHALATION_SOLUTION | Freq: Four times a day (QID) | RESPIRATORY_TRACT | Status: DC
  Administered 2014-09-06: 2.5 mg via RESPIRATORY_TRACT
  Filled 2014-09-06 (×2): qty 3

## 2014-09-06 NOTE — ED Provider Notes (Signed)
CSN: [id number]     Arrival date & time 09/06/14  1305 History   First MD Initiated Contact with Patient 09/06/14 1321     Chief Complaint  Patient presents with  . Leg Pain  . Shortness of Breath  . Chest Pain    Patient gave verbal permission to utilize photo for medical documentation only The image was not stored on any personal device  Patient is a 69 y.o. male presenting with leg pain. The history is provided by the patient.  Leg Pain Location:  Leg Leg location:  R leg Pain details:    Quality:  Sharp   Radiates to:  Does not radiate   Severity:  Severe   Onset quality:  Gradual   Duration: "several days"   Timing:  Constant   Progression:  Worsening Chronicity:  Recurrent Relieved by:  Nothing Exacerbated by: movement. Associated symptoms: fever   Patient reports right LE pain since vascular surgery back in January He reports recent infection in his leg and has been on home antibiotics However over past several days the wound has become more red/painful  He also reports chronic SOB (he uses oxygen at home daily 2L) He also reports mild/brief CP that is improved   Pt is s/p right LE fem/pop bypass in January 2016  Past Medical History  Diagnosis Date  . CAD (coronary artery disease)     a. s/p CABG x 5;  b. 04/2012 Cath: patent LIMA->LAD and VG->OM3, all other grafts occluded. c. Cath 12/2013: patent SVG-OM3 & LIMA-LAD, known occ other VG.  Marland Kitchen Hypertension   . Adjustment disorder with anxiety   . Hyperlipidemia     a. Unable to take statins.  Marland Kitchen BPH (benign prostatic hypertrophy)   . COPD (chronic obstructive pulmonary disease)     a. on home O2.  . Arthritis   . Ischemic cardiomyopathy     a. 06/2013 Echo: EF 30-35%, no reg wma's, Gr2 DD, triv AI, mod dil LA, nl RV fxn.  . Chronic systolic CHF (congestive heart failure)     a. 06/2013 Echo: EF 30-35%. b. 11/08/13 EF 40-45%, LVH, HK of the mid-distalanterseptal myocardium, trivial Ao regurg, calcified mitral  annulus, RA mildly dilated  . Prostate cancer dx'd 2012  . Renal cancer dx'd 1997    lt nephrectomy  . CKD (chronic kidney disease), stage III   . Tobacco abuse     a. 39 yr hx as of 2015, transitioned to e-cig.  Marland Kitchen Syncope     a. 08/2013: the day following cath, no injury, had received multiple doses of Versed for anxiety - this was felt to be a contributing factor.  Marland Kitchen PAF (paroxysmal atrial fibrillation)     a. on Eliquis 5 mg bid  . Hypotension     a. H/o soft BP prohibiting ACEI/ARB use.  . Chronic pain   . PAD (peripheral artery disease)     a. 12/2013: PV angio s/p angioplasty to R external iliac artery stenosis, also has R SFA occlusion with reconstitution of above-knee popliteal artery, 2 vessel runoff via peroneal and posterior tib.b. LE doppler 02/2014 ABI right 0.52, left 0.95  . Pulmonary nodules     a. 12/2013:  small pulmonary nodules measuring up to 44mm in RM/LL, f/u recommended 6-12 months.  . Anxiety Dx 2004  . Anginal pain     none recent  . Shortness of breath dyspnea   . Rheumatic fever     child  . Family history  of adverse reaction to anesthesia     SISTER HAD COPLICATIONS  TOOK 2 WEEKS TO WAKE UP "   Past Surgical History  Procedure Laterality Date  . Coronary artery bypass graft      x 5  . Cardiac catheterization    . Nephrectomy      left nephrectomy for ca  . Posterior cervical laminectomy      x 8   limited ROM  and can't lie flat  . Heart stents      x 5  . Robot assisted laparoscopic radical prostatectomy      for prostate cancer  . Cystoscopy with litholapaxy  05/01/2012    Procedure: CYSTOSCOPY WITH LITHOLAPAXY;  Surgeon: Dutch Gray, MD;  Location: [facility];  Service: Urology;  Laterality: N/A;  . Prostate cancer    . Kidney surgery Left 1994  . Left and right heart catheterization with coronary/graft angiogram N/A 04/24/2012    Procedure: LEFT AND RIGHT HEART CATHETERIZATION WITH Beatrix Fetters;  Surgeon: Sherren Mocha, MD;  Location:  [facility] CATH LAB;  Service: Cardiovascular;  Laterality: N/A;  . Left and right heart catheterization with coronary angiogram N/A 08/21/2013    Procedure: LEFT AND RIGHT HEART CATHETERIZATION WITH CORONARY ANGIOGRAM;  Surgeon: Josue Hector, MD;  Location: [facility] CATH LAB;  Service: Cardiovascular;  Laterality: N/A;  . Lower extremity angiogram N/A 08/21/2013    Procedure: LOWER EXTREMITY ANGIOGRAM;  Surgeon: Josue Hector, MD;  Location: [facility] CATH LAB;  Service: Cardiovascular;  Laterality: N/A;  . Abdominal aortagram N/A 12/25/2013    Procedure: ABDOMINAL AORTAGRAM;  Surgeon: Serafina Mitchell, MD;  Location: [facility] CATH LAB;  Service: Cardiovascular;  Laterality: N/A;  . Left heart catheterization with coronary/graft angiogram N/A 04/19/2014    Procedure: LEFT HEART CATHETERIZATION WITH Beatrix Fetters;  Surgeon: Jettie Booze, MD;  Location: [facility] CATH LAB;  Service: Cardiovascular;  Laterality: N/A;  . Abdominal aortagram N/A 06/26/2014    Procedure: ABDOMINAL AORTAGRAM;  Surgeon: Serafina Mitchell, MD;  Location: [facility] CATH LAB;  Service: Cardiovascular;  Laterality: N/A;  . Femoral-popliteal bypass graft Right 07/26/2014    Procedure: BYPASS GRAFT FEMORAL-POPLITEAL ARTERY- right leg with PTFE;  Surgeon: Serafina Mitchell, MD;  Location: [facility];  Service: Vascular;  Laterality: Right;   Family History  Problem Relation Age of Onset  . Diabetes Mother   . Heart disease Mother   . Hypertension Mother   . Heart attack Mother   . Coronary artery disease    . Heart disease Father   . Diabetes Father   . Heart attack Father   . Heart disease Brother   . Heart attack Brother   . COPD Sister    History  Substance Use Topics  . Smoking status: Current Every Day Smoker -- 0.05 packs/day for 54 years    Types: Cigarettes    Last Attempt to Quit: 08/14/2014  . Smokeless tobacco: Former Systems developer    Quit date: 04/19/2012     Comment: Using e-cig now  smokes once in a while  . Alcohol Use: 0.0 oz/week    0  Standard drinks or equivalent per week     Comment: vodka cranberry occ    Review of Systems  Constitutional: Positive for fever.      Allergies  Aspirin; Ativan; Lisinopril; Morphine and related; and Ibuprofen  Home Medications   Prior to Admission medications   Medication Sig Start Date End Date Taking? Authorizing Provider  albuterol (PROVENTIL HFA;VENTOLIN HFA)  108 (90 BASE) MCG/ACT inhaler Inhale 2 puffs into the lungs every 6 (six) hours as needed for wheezing or shortness of breath. Shortness of breath Patient taking differently: Inhale 2 puffs into the lungs every 6 (six) hours as needed for wheezing or shortness of breath.  08/16/14   Brand Males, MD  albuterol (PROVENTIL) (2.5 MG/3ML) 0.083% nebulizer solution Take 3 mLs (2.5 mg total) by nebulization 4 (four) times daily. Dx J44.1 05/31/14   Brand Males, MD  ALPRAZolam Duanne Moron) 1 MG tablet Take 1 tablet (1 mg total) by mouth 3 (three) times daily. 08/11/14   Theodis Blaze, MD  apixaban (ELIQUIS) 5 MG TABS tablet Take 1 tablet (5 mg total) by mouth 2 (two) times daily. 05/08/14   Serafina Mitchell, MD  doxazosin (CARDURA) 8 MG tablet Take 1 tablet (8 mg total) by mouth at bedtime. 06/10/14   Brand Males, MD  furosemide (LASIX) 40 MG tablet Take 1 tablet (40 mg total) by mouth 2 (two) times daily. 08/11/14   Theodis Blaze, MD  HYDROcodone-acetaminophen (NORCO) 10-325 MG per tablet Take 1 tablet by mouth 3 (three) times daily. Patient taking differently: Take 1 tablet by mouth 5 (five) times daily.  06/27/14   Samantha J Rhyne, PA-C  HYDROmorphone (DILAUDID) 4 MG tablet Take 1 tablet (4 mg total) by mouth every 4 (four) hours as needed for moderate pain. 08/11/14   Theodis Blaze, MD  Ipratropium-Albuterol (COMBIVENT RESPIMAT) 20-100 MCG/ACT AERS respimat Inhale 1 puff into the lungs 4 (four) times daily. 08/16/14   Brand Males, MD  levofloxacin (LEVAQUIN) 750 MG tablet Take 1 tablet (750 mg total) by mouth daily. 08/22/14    Almyra Deforest, PA  mometasone-formoterol (DULERA) 100-5 MCG/ACT AERO Inhale 2 puffs into the lungs 2 (two) times daily. 06/10/14   Brand Males, MD  moxifloxacin (AVELOX) 400 MG tablet Take 1 tablet (400 mg total) by mouth daily at 8 pm. Patient taking differently: Take 400 mg by mouth daily at 8 pm. 14 day course filled 08/11/14 08/11/14   Theodis Blaze, MD  nitroGLYCERIN (NITROSTAT) 0.3 MG SL tablet Place 0.3 mg under the tongue every 5 (five) minutes as needed for chest pain.    Historical Provider, MD  predniSONE (DELTASONE) 10 MG tablet Take 50 mg tablet today and taper down by 10 mg daily until completed Patient taking differently: Take 10 mg by mouth See admin instructions. Take 1 tablet (10 mg) every morning; may take a 2nd tablet after 2pm if needed for chest pressure from fluid buildup 08/11/14   Theodis Blaze, MD   BP 115/85 mmHg  Pulse 102  Temp(Src) 101.8 F (38.8 C) (Oral)  Resp 24  Ht 5\' 9"  (1.753 m)  Wt 219 lb (99.338 kg)  BMI 32.33 kg/m2  SpO2 95% Physical Exam CONSTITUTIONAL: Well developed/well nourished, anxious HEAD: Normocephalic/atraumatic EYES: EOMI/PERRL ENMT: Mucous membranes moist NECK: supple no meningeal signs SPINE/BACK:entire spine nontender CV: S1/S2 noted, no murmurs/rubs/gallops noted LUNGS: scattered wheezing bilaterally ABDOMEN: soft, nontender, no rebound or guarding, bowel sounds noted throughout abdomen GU:no cva tenderness NEURO: Pt is awake/alert/appropriate, moves all extremitiesx4.  No facial droop.   EXTREMITIES: the right foot is warm to touch, see photo below SKIN: warm, color normal PSYCH: no abnormalities of mood noted, alert and oriented to situation      ED Course  Procedures   D/w dr fields He will admit to vascular service for post-operative complications  Labs Review Labs Reviewed  BASIC METABOLIC PANEL -  Abnormal; Notable for the following:    Glucose, Bld 105 (*)    Creatinine, Ser 1.41 (*)    GFR calc non Af Amer 49  (*)    GFR calc Af Amer 57 (*)    All other components within normal limits  CBC WITH DIFFERENTIAL/PLATELET - Abnormal; Notable for the following:    WBC 11.4 (*)    Hemoglobin 12.2 (*)    HCT 38.4 (*)    RDW 16.8 (*)    Neutrophils Relative % 85 (*)    Neutro Abs 9.8 (*)    Lymphocytes Relative 5 (*)    Lymphs Abs 0.5 (*)    All other components within normal limits  CBC - Abnormal; Notable for the following:    WBC 11.1 (*)    Hemoglobin 12.8 (*)    RDW 16.7 (*)    All other components within normal limits  PROTIME-INR - Abnormal; Notable for the following:    Prothrombin Time 18.2 (*)    All other components within normal limits  CULTURE, BLOOD (ROUTINE X 2)  CULTURE, BLOOD (ROUTINE X 2)  COMPREHENSIVE METABOLIC PANEL  I-STAT CG4 LACTIC ACID, ED    Imaging Review Dg Chest Portable 1 View  09/06/2014   CLINICAL DATA:  Leg pain.  Short of breath and chest pain  EXAM: PORTABLE CHEST - 1 VIEW  COMPARISON:  08/20/2014  FINDINGS: Postop CABG. Heart size is normal. Mild vascular congestion without edema or effusion  Mild atelectasis in the lung bases. Mild hypoventilation. No definite pneumonia.  IMPRESSION: Hypoventilation with mild bibasilar atelectasis.   Electronically Signed   By: Franchot Gallo M.D.   On: 09/06/2014 13:50     EKG Interpretation   Date/Time:  Friday September 06 2014 13:19:21 EST Ventricular Rate:  103 PR Interval:  145 QRS Duration: 98 QT Interval:  367 QTC Calculation: 480 R Axis:   68 Text Interpretation:  Sinus tachycardia Anteroseptal infarct, old Minimal  ST depression, inferior leads Confirmed by [name] Gentles  MD, Dima Ferrufino ([id number]) on  09/06/2014 2:33:33 PM      MDM   Final diagnoses:  Cellulitis of right lower extremity  Sepsis, due to unspecified organism    Nursing notes including past medical history and social history reviewed and considered in documentation ,xrays/imaging reviewed by myself and considered during evaluation Labs/vital  reviewed myself and considered during evaluation     Sharyon Cable, MD 09/06/14 [phone]

## 2014-09-06 NOTE — ED Notes (Signed)
Per EMS, pt called out for increased right leg pain. Pt has hx of bypass in the right upper and lower leg. Pt has edema and redness to the right leg. Pt also reports SOB, mild CP, and RUQ abd pain. Pt has hx of COPD and wears 2L of O2 at all times. Pt alert x4 at this time.

## 2014-09-06 NOTE — Progress Notes (Signed)
Patient sitting up on the side of the bed sleeping. Pt. States he has been doing this for years. Pt. Educated on the fall risks associated with sitting up and asked to lay down in bed several times. Pt. Refuses to lay down. Will continue to monitor pt.

## 2014-09-06 NOTE — Progress Notes (Signed)
Called ED for report at 3:42.  Transferred to RN, the put on hold for several mins, then call disconnected.

## 2014-09-06 NOTE — ED Notes (Signed)
Paged vascular to [facility] phone.

## 2014-09-06 NOTE — Progress Notes (Signed)
ANTIBIOTIC CONSULT NOTE - INITIAL  Pharmacy Consult for Vancomycin Indication: wound infection  Allergies  Allergen Reactions  . Aspirin Other (See Comments)    Causes nose bleeds  . Ativan [Lorazepam] Other (See Comments)    Increases agitation, tolerates xanax  . Lisinopril Cough  . Morphine And Related Other (See Comments)    Hallucinations, too sedated when given with ativan  . Ibuprofen Hives, Nausea And Vomiting and Rash    Patient Measurements: Height: 5\' 9"  (175.3 cm) Weight: 219 lb (99.338 kg) IBW/kg (Calculated) : 70.7  Vital Signs: Temp: 101.8 F (38.8 C) (02/26 1322) Temp Source: Oral (02/26 1322) BP: 153/67 mmHg (02/26 1445) Pulse Rate: 103 (02/26 1445) Intake/Output from previous day:   Intake/Output from this shift:    Labs:  Recent Labs  09/06/14 1408  WBC 11.4*  HGB 12.2*  PLT 192  CREATININE 1.41*   Estimated Creatinine Clearance: 57.4 mL/min (by C-G formula based on Cr of 1.41). No results for input(s): VANCOTROUGH, VANCOPEAK, VANCORANDOM, GENTTROUGH, GENTPEAK, GENTRANDOM, TOBRATROUGH, TOBRAPEAK, TOBRARND, AMIKACINPEAK, AMIKACINTROU, AMIKACIN in the last 72 hours.   Microbiology: Recent Results (from the past 720 hour(s))  Blood culture (routine x 2)     Status: None   Collection Time: 08/09/14 10:54 AM  Result Value Ref Range Status   Specimen Description BLOOD RIGHT HAND  Final   Special Requests BOTTLES DRAWN AEROBIC AND ANAEROBIC 5ML  Final   Culture   Final    NO GROWTH 5 DAYS Performed at [facility]    Report Status 08/15/2014 FINAL  Final  Blood culture (routine x 2)     Status: None   Collection Time: 08/09/14 10:55 AM  Result Value Ref Range Status   Specimen Description BLOOD LEFT FOREARM  Final   Special Requests BOTTLES DRAWN AEROBIC AND ANAEROBIC 5ML  Final   Culture   Final    NO GROWTH 5 DAYS Performed at [facility]    Report Status 08/15/2014 FINAL  Final  Wound culture     Status: None   Collection Time: 08/09/14 11:03 AM  Result Value Ref Range Status   Specimen Description WOUND RIGHT LEG CALF  Final   Special Requests NONE  Final   Gram Stain   Final    NO WBC SEEN NO SQUAMOUS EPITHELIAL CELLS SEEN FEW GRAM POSITIVE COCCI IN PAIRS Performed at [facility]    Culture   Final    ABUNDANT STAPHYLOCOCCUS AUREUS Note: RIFAMPIN AND GENTAMICIN SHOULD NOT BE USED AS SINGLE DRUGS FOR TREATMENT OF STAPH INFECTIONS. This organism is presumed to be Clindamycin resistant based on detection of inducible Clindamycin resistance. Performed at [facility]    Report Status 08/12/2014 FINAL  Final   Organism ID, Bacteria STAPHYLOCOCCUS AUREUS  Final      Susceptibility   Staphylococcus aureus - MIC*    CLINDAMYCIN RESISTANT      ERYTHROMYCIN >=8 RESISTANT Resistant     GENTAMICIN <=0.5 SENSITIVE Sensitive     LEVOFLOXACIN <=0.12 SENSITIVE Sensitive     OXACILLIN <=0.25 SENSITIVE Sensitive     PENICILLIN >=0.5 RESISTANT Resistant     RIFAMPIN <=0.5 SENSITIVE Sensitive     TRIMETH/SULFA <=10 SENSITIVE Sensitive     VANCOMYCIN <=0.5 SENSITIVE Sensitive     TETRACYCLINE >=16 RESISTANT Resistant     MOXIFLOXACIN <=0.25 SENSITIVE Sensitive     * ABUNDANT STAPHYLOCOCCUS AUREUS    Medical History: Past Medical History  Diagnosis Date  . CAD (coronary artery  disease)     a. s/p CABG x 5;  b. 04/2012 Cath: patent LIMA->LAD and VG->OM3, all other grafts occluded. c. Cath 12/2013: patent SVG-OM3 & LIMA-LAD, known occ other VG.  Marland Kitchen Hypertension   . Adjustment disorder with anxiety   . Hyperlipidemia     a. Unable to take statins.  Marland Kitchen BPH (benign prostatic hypertrophy)   . COPD (chronic obstructive pulmonary disease)     a. on home O2.  . Arthritis   . Ischemic cardiomyopathy     a. 06/2013 Echo: EF 30-35%, no reg wma's, Gr2 DD, triv AI, mod dil LA, nl RV fxn.  . Chronic systolic CHF (congestive heart failure)     a. 06/2013 Echo: EF 30-35%. b. 11/08/13 EF 40-45%,  LVH, HK of the mid-distalanterseptal myocardium, trivial Ao regurg, calcified mitral annulus, RA mildly dilated  . Prostate cancer dx'd 2012  . Renal cancer dx'd 1997    lt nephrectomy  . CKD (chronic kidney disease), stage III   . Tobacco abuse     a. 30 yr hx as of 2015, transitioned to e-cig.  Marland Kitchen Syncope     a. 08/2013: the day following cath, no injury, had received multiple doses of Versed for anxiety - this was felt to be a contributing factor.  Marland Kitchen PAF (paroxysmal atrial fibrillation)     a. on Eliquis 5 mg bid  . Hypotension     a. H/o soft BP prohibiting ACEI/ARB use.  . Chronic pain   . PAD (peripheral artery disease)     a. 12/2013: PV angio s/p angioplasty to R external iliac artery stenosis, also has R SFA occlusion with reconstitution of above-knee popliteal artery, 2 vessel runoff via peroneal and posterior tib.b. LE doppler 02/2014 ABI right 0.52, left 0.95  . Pulmonary nodules     a. 12/2013:  small pulmonary nodules measuring up to 75mm in RM/LL, f/u recommended 6-12 months.  . Anxiety Dx 2004  . Anginal pain     none recent  . Shortness of breath dyspnea   . Rheumatic fever     child  . Family history of adverse reaction to anesthesia     SISTER HAD COPLICATIONS  TOOK 2 WEEKS TO WAKE UP "    Medications:   (Not in a hospital admission) Scheduled:  . acetaminophen  650 mg Oral Once  . albuterol  2.5 mg Nebulization QID  . ALPRAZolam  1 mg Oral TID  . apixaban  5 mg Oral BID  . doxazosin  8 mg Oral QHS  . furosemide  40 mg Oral BID  . HYDROcodone-acetaminophen  1 tablet Oral 5 X Daily  . Ipratropium-Albuterol  1 puff Inhalation QID  . levofloxacin  750 mg Oral Daily  . mometasone-formoterol  2 puff Inhalation BID  . pantoprazole  40 mg Oral Daily  . potassium chloride  20-40 mEq Oral Once  . predniSONE  10 mg Oral See admin instructions   Infusions:  . sodium chloride    . piperacillin-tazobactam (ZOSYN)  IV     Assessment: 69yo male with history of  femoral-popliteal bypass graft presents with RLE edema, erythema, and severe pain. Pharmacy is consulted to dose vancomycin for wound infection. Pt is febrile to 101.8, WBC 11.4, sCr 1.41. Will load patient based on fever and severity of wound.  Pt was started on Zosyn 3.375g IV q8h and Levaquin 750mg  IV q24h, which are appropriate based on renal function.   Goal of Therapy:  Vancomycin trough level  15-20 mcg/ml  Plan:  Vancomycin 1500mg  IV load followed by 750mg  q12h Measure antibiotic drug levels at steady state Follow up culture results, renal function, and clinical course  Andrey Cota. Diona Foley, PharmD Clinical Pharmacist Pager [phone] 09/06/2014,3:09 PM

## 2014-09-06 NOTE — Consult Note (Signed)
[facility] [place] CONSULT NOTE   MRN : [id number]  Reason for Consult: right LE edema erythema and sever pain s/p R LE fem BK Pop BPG with PTFE Referring Physician: ED  History of Present Illness: 69 y/o male s/p R LE fem BK Pop BPG with PTFE 07/26/2014.  He has reported increased pain and edema just since yesterday.  He was seen in the office by Dr. Trula Slade and was in stable condition.  He is a chronically ill male with multiple issues. These include COPD with chronic respiratory failure on home oxygen at night, chronic systolic HF with EF of 40 to 45%, remote renal cancer with past nephrectomy with CKD stage III, CAD with past CABG with his LIMA to the LAD patent and the SVG to OM3 but all other SVGs remain occluded. His other issues include anxiety, PAF and long standing tobacco abuse, PAD with past angioplasty in June of 2015 and chronic pain. Numerous hospitalizations. He is now on chronic anticoagulation but has had issues in the past with affording the Eliquis.        Current Facility-Administered Medications  Medication Dose Route Frequency Provider Last Rate Last Dose  . acetaminophen (TYLENOL) tablet 650 mg  650 mg Oral Once Sharyon Cable, MD      . HYDROmorphone (DILAUDID) injection 1 mg  1 mg Intravenous Once Sharyon Cable, MD       Current Outpatient Prescriptions  Medication Sig Dispense Refill  . albuterol (PROVENTIL HFA;VENTOLIN HFA) 108 (90 BASE) MCG/ACT inhaler Inhale 2 puffs into the lungs every 6 (six) hours as needed for wheezing or shortness of breath. Shortness of breath (Patient taking differently: Inhale 2 puffs into the lungs every 6 (six) hours as needed for wheezing or shortness of breath. ) 1 Inhaler 5  . albuterol (PROVENTIL) (2.5 MG/3ML) 0.083% nebulizer solution Take 3 mLs (2.5 mg total) by nebulization 4 (four) times daily. Dx J44.1 360 mL 5  . ALPRAZolam (XANAX) 1 MG tablet Take 1 tablet (1 mg total) by mouth 3 (three) times daily.  90 tablet 2  . apixaban (ELIQUIS) 5 MG TABS tablet Take 1 tablet (5 mg total) by mouth 2 (two) times daily. 60 tablet 1  . doxazosin (CARDURA) 8 MG tablet Take 1 tablet (8 mg total) by mouth at bedtime. 60 tablet 0  . furosemide (LASIX) 40 MG tablet Take 1 tablet (40 mg total) by mouth 2 (two) times daily. 60 tablet 1  . HYDROcodone-acetaminophen (NORCO) 10-325 MG per tablet Take 1 tablet by mouth 3 (three) times daily. (Patient taking differently: Take 1 tablet by mouth 5 (five) times daily. ) 15 tablet 0  . HYDROmorphone (DILAUDID) 4 MG tablet Take 1 tablet (4 mg total) by mouth every 4 (four) hours as needed for moderate pain. 90 tablet 0  . Ipratropium-Albuterol (COMBIVENT RESPIMAT) 20-100 MCG/ACT AERS respimat Inhale 1 puff into the lungs 4 (four) times daily. 1 Inhaler 5  . levofloxacin (LEVAQUIN) 750 MG tablet Take 1 tablet (750 mg total) by mouth daily. 14 tablet 0  . mometasone-formoterol (DULERA) 100-5 MCG/ACT AERO Inhale 2 puffs into the lungs 2 (two) times daily. 1 Inhaler 5  . moxifloxacin (AVELOX) 400 MG tablet Take 1 tablet (400 mg total) by mouth daily at 8 pm. (Patient taking differently: Take 400 mg by mouth daily at 8 pm. 14 day course filled 08/11/14) 14 tablet 0  . nitroGLYCERIN (NITROSTAT) 0.3 MG SL tablet Place 0.3 mg under the tongue every  5 (five) minutes as needed for chest pain.    . predniSONE (DELTASONE) 10 MG tablet Take 50 mg tablet today and taper down by 10 mg daily until completed (Patient taking differently: Take 10 mg by mouth See admin instructions. Take 1 tablet (10 mg) every morning; may take a 2nd tablet after 2pm if needed for chest pressure from fluid buildup) 15 tablet 5    Pt meds include: Statin :no  Betablocker: No ASA: No Other anticoagulants/antiplatelets: eliquis  Past Medical History  Diagnosis Date  . CAD (coronary artery disease)     a. s/p CABG x 5;  b. 04/2012 Cath: patent LIMA->LAD and VG->OM3, all other grafts occluded. c. Cath 12/2013:  patent SVG-OM3 & LIMA-LAD, known occ other VG.  Marland Kitchen Hypertension   . Adjustment disorder with anxiety   . Hyperlipidemia     a. Unable to take statins.  Marland Kitchen BPH (benign prostatic hypertrophy)   . COPD (chronic obstructive pulmonary disease)     a. on home O2.  . Arthritis   . Ischemic cardiomyopathy     a. 06/2013 Echo: EF 30-35%, no reg wma's, Gr2 DD, triv AI, mod dil LA, nl RV fxn.  . Chronic systolic CHF (congestive heart failure)     a. 06/2013 Echo: EF 30-35%. b. 11/08/13 EF 40-45%, LVH, HK of the mid-distalanterseptal myocardium, trivial Ao regurg, calcified mitral annulus, RA mildly dilated  . Prostate cancer dx'd 2012  . Renal cancer dx'd 1997    lt nephrectomy  . CKD (chronic kidney disease), stage III   . Tobacco abuse     a. 55 yr hx as of 2015, transitioned to e-cig.  Marland Kitchen Syncope     a. 08/2013: the day following cath, no injury, had received multiple doses of Versed for anxiety - this was felt to be a contributing factor.  Marland Kitchen PAF (paroxysmal atrial fibrillation)     a. on Eliquis 5 mg bid  . Hypotension     a. H/o soft BP prohibiting ACEI/ARB use.  . Chronic pain   . PAD (peripheral artery disease)     a. 12/2013: PV angio s/p angioplasty to R external iliac artery stenosis, also has R SFA occlusion with reconstitution of above-knee popliteal artery, 2 vessel runoff via peroneal and posterior tib.b. LE doppler 02/2014 ABI right 0.52, left 0.95  . Pulmonary nodules     a. 12/2013:  small pulmonary nodules measuring up to 61mm in RM/LL, f/u recommended 6-12 months.  . Anxiety Dx 2004  . Anginal pain     none recent  . Shortness of breath dyspnea   . Rheumatic fever     child  . Family history of adverse reaction to anesthesia     SISTER HAD COPLICATIONS  TOOK 2 WEEKS TO WAKE UP "    Past Surgical History  Procedure Laterality Date  . Coronary artery bypass graft      x 5  . Cardiac catheterization    . Nephrectomy      left nephrectomy for ca  . Posterior cervical  laminectomy      x 8   limited ROM  and can't lie flat  . Heart stents      x 5  . Robot assisted laparoscopic radical prostatectomy      for prostate cancer  . Cystoscopy with litholapaxy  05/01/2012    Procedure: CYSTOSCOPY WITH LITHOLAPAXY;  Surgeon: Dutch Gray, MD;  Location: [facility] ORS;  Service: Urology;  Laterality: N/A;  . Prostate  cancer    . Kidney surgery Left 1994  . Left and right heart catheterization with coronary/graft angiogram N/A 04/24/2012    Procedure: LEFT AND RIGHT HEART CATHETERIZATION WITH Beatrix Fetters;  Surgeon: Sherren Mocha, MD;  Location: [facility] CATH LAB;  Service: Cardiovascular;  Laterality: N/A;  . Left and right heart catheterization with coronary angiogram N/A 08/21/2013    Procedure: LEFT AND RIGHT HEART CATHETERIZATION WITH CORONARY ANGIOGRAM;  Surgeon: Josue Hector, MD;  Location: [facility] CATH LAB;  Service: Cardiovascular;  Laterality: N/A;  . Lower extremity angiogram N/A 08/21/2013    Procedure: LOWER EXTREMITY ANGIOGRAM;  Surgeon: Josue Hector, MD;  Location: [facility] CATH LAB;  Service: Cardiovascular;  Laterality: N/A;  . Abdominal aortagram N/A 12/25/2013    Procedure: ABDOMINAL AORTAGRAM;  Surgeon: Serafina Mitchell, MD;  Location: [facility] CATH LAB;  Service: Cardiovascular;  Laterality: N/A;  . Left heart catheterization with coronary/graft angiogram N/A 04/19/2014    Procedure: LEFT HEART CATHETERIZATION WITH Beatrix Fetters;  Surgeon: Jettie Booze, MD;  Location: [facility] CATH LAB;  Service: Cardiovascular;  Laterality: N/A;  . Abdominal aortagram N/A 06/26/2014    Procedure: ABDOMINAL AORTAGRAM;  Surgeon: Serafina Mitchell, MD;  Location: [facility] CATH LAB;  Service: Cardiovascular;  Laterality: N/A;  . Femoral-popliteal bypass graft Right 07/26/2014    Procedure: BYPASS GRAFT FEMORAL-POPLITEAL ARTERY- right leg with PTFE;  Surgeon: Serafina Mitchell, MD;  Location: [facility] OR;  Service: Vascular;  Laterality: Right;    Social History History  Substance Use  Topics  . Smoking status: Current Every Day Smoker -- 0.05 packs/day for 54 years    Types: Cigarettes    Last Attempt to Quit: 08/14/2014  . Smokeless tobacco: Former Systems developer    Quit date: 04/19/2012     Comment: Using e-cig now  smokes once in a while  . Alcohol Use: 0.0 oz/week    0 Standard drinks or equivalent per week     Comment: vodka cranberry occ    Family History Family History  Problem Relation Age of Onset  . Diabetes Mother   . Heart disease Mother   . Hypertension Mother   . Heart attack Mother   . Coronary artery disease    . Heart disease Father   . Diabetes Father   . Heart attack Father   . Heart disease Brother   . Heart attack Brother   . COPD Sister     Allergies  Allergen Reactions  . Aspirin Other (See Comments)    Causes nose bleeds  . Ativan [Lorazepam] Other (See Comments)    Increases agitation, tolerates xanax  . Lisinopril Cough  . Morphine And Related Other (See Comments)    Hallucinations, too sedated when given with ativan  . Ibuprofen Hives, Nausea And Vomiting and Rash     REVIEW OF SYSTEMS  General: [ ]  Weight loss, [ ]  Fever, [ ]  chills Neurologic: [ ]  Dizziness, [ ]  Blackouts, [ ]  Seizure [ ]  Stroke, [ ]  "Mini stroke", [ ]  Slurred speech, [ ]  Temporary blindness; [ ]  weakness in arms or legs, [ ]  Hoarseness [ ]  Dysphagia Cardiac: [ ]  Chest pain/pressure, [ ]  Shortness of breath at rest [ ]  Shortness of breath with exertion, [ ]  Atrial fibrillation or irregular heartbeat  Vascular: [ ]  Pain in legs with walking, [ ]  Pain in legs at rest, [ ]  Pain in legs at night,  [ ]  Non-healing ulcer, [ ]  Blood clot in vein/DVT,  [x]  non healing  incision popliteal area. Pulmonary: [ ]  Home oxygen, [ ]  Productive cough, [ ]  Coughing up blood, [ ]  Asthma,  [ ]  Wheezing [ ]  COPD Musculoskeletal:  [ ]  Arthritis, [x ] Low back pain, [ ]  Joint pain [x]  chronic pain Hematologic: [ ]  Easy Bruising, [ ]  Anemia; [ ]  Hepatitis Gastrointestinal: [ ]  Blood  in stool, [ ]  Gastroesophageal Reflux/heartburn, Urinary: [x ] chronic Kidney disease, [ ]  on HD - [ ]  MWF or [ ]  TTHS, [ ]  Burning with urination, [ ]  Difficulty urinating Skin: [ ]  Rashes, [ ]  Wounds Psychological: [ ]  Anxiety, [ ]  Depression  Physical Examination Filed Vitals:   09/06/14 1318 09/06/14 1322 09/06/14 1422  BP:  115/85   Pulse:  102 102  Temp:  101.8 F (38.8 C)   TempSrc:  Oral   Resp:  24 24  Height: 5\' 9"  (1.753 m)    Weight: 219 lb (99.338 kg)    SpO2:  95% 98%   Body mass index is 32.33 kg/(m^2).  General:  WDWN in NAD Gait: Normal HENT: WNL Eyes: Pupils equal Pulmonary: normal non-labored breathing , without Rales, rhonchi,  wheezing Cardiac: RRR, without  Murmurs, rubs or gallops; No carotid bruits Abdomen: soft, NT, no masses Skin: no rashes, ulcers noted;  no Gangrene , no cellulitis; no open wounds;   Vascular Exam/Pulses:Doppler signal right PT/DP Distal incision yellow eschar, yellow drainage visible suture, bypass not visualized.  Erythema up to patella, moderate edema.   Musculoskeletal: no muscle wasting or atrophy; no edema  Neurologic: A&O X 3; Appropriate Affect ;  SENSATION: normal; MOTOR FUNCTION: 5/5 Symmetric Speech is fluent/normal   Significant Diagnostic Studies: CBC Lab Results  Component Value Date   WBC 11.4* 09/06/2014   HGB 12.2* 09/06/2014   HCT 38.4* 09/06/2014   MCV 87.5 09/06/2014   PLT 192 09/06/2014    BMET    Component Value Date/Time   NA 141 08/27/2014 0958   K 4.7 08/27/2014 0958   CL 104 08/27/2014 0958   CO2 24 08/27/2014 0958   GLUCOSE 100* 08/27/2014 0958   BUN 26* 08/27/2014 0958   CREATININE 1.19 08/27/2014 0958   CREATININE 1.56* 08/22/2014 0527   CALCIUM 9.6 08/27/2014 0958   GFRNONAA 44* 08/22/2014 0527   GFRAA 51* 08/22/2014 0527   Estimated Creatinine Clearance: 68 mL/min (by C-G formula based on Cr of 1.19).  COAG Lab Results  Component Value Date   INR 1.14 08/19/2014   INR  1.25 08/09/2014   INR 1.10 07/23/2014     Non-Invasive Vascular Imaging:   ASSESSMENT/PLAN:  s/p R LE fem BK Pop BPG with PTFE for severe claudication / rest pain on 07/26/2014 Below knee incision infection Admit start Vancomycin and Zosyn IV    COLLINS, EMMA MAUREEN 09/06/2014 2:36 PM  Agree with above.  Pt with cellulitis in leg with PTFE fem pop.  High likelihood that his graft is infected.  Will admit for IV antibiotics but may need graft removal.  Ruta Hinds, MD [facility] [place] Office: [phone] Pager: [phone]

## 2014-09-06 NOTE — Telephone Encounter (Signed)
rec'd VM from [facility] RN; reported she saw pt. last evening and his right leg was fiery red and warm.  Described the redness progressing up the thigh from the wound right leg.  Reported temperature of 100.8 last PM.  Also reported pt. Is having problems swallowing the Levaquin pills, due to the large size.  Unable to reach the [facility] RN at this time.  Called pt. to check on his status.  Stated "I feel lousy."  C/o "excruciating pain" in his right leg.  Stated he thinks his leg looks a lot worse since he was evaluated on 2/24 by Dr. Trula Slade.  Stated he doesn't have a thermometer, so hasn't checked his temperature this morning. Reported he has not missed any doses of antibiotic, even though the pill is hard to swallow.  Stated he is having difficulty with his breathing; stated " I have so much fluid and congestion in my chest."  Discussed with Dr. Trula Slade.  Recommended that due to multiple medical issues, he needs to go to the ER and be evaluated.  Phone call back to pt.  Advised of Dr. Stephens Shire recommendations to go to the Emergency Room.  Stated he will call a friend and try to get transportation to the ER.

## 2014-09-07 ENCOUNTER — Inpatient Hospital Stay (HOSPITAL_COMMUNITY): Payer: Medicare Other

## 2014-09-07 NOTE — Progress Notes (Signed)
Report called to [facility] [employer] on 2W.

## 2014-09-07 NOTE — Progress Notes (Signed)
Pt transferred to 2W17.

## 2014-09-07 NOTE — Progress Notes (Signed)
[facility]   Subjective  - Multiple complaints, does not like bed alarm, does not like dressing changed, does not like being here.  Chest pain resolved.   Objective 140/92 67 98.9 F (37.2 C) (Oral) 25 92%  Intake/Output Summary (Last 24 hours) at 09/07/14 0786 Last data filed at 09/07/14 7544  Gross per 24 hour  Intake      0 ml  Output    400 ml  Net   -400 ml   Right leg: cellulitis may be slightly improved, still with serous drainage and yellow fibrinous exudate right below knee incision, groin some maceration, no real drainage or erythema doppler PT  Left leg: doppler signals  Chest: O2 sats low 90s on supplemental O2, audible rales    Assessment/Planning: 1.  Chest pain: probably baseline angina, no EKG change, troponin not really elevated, keep on monitor 2.  COPD continue current nebs, saline lock IV to minimize fluid, chest xray today may need extra lasix 3.  Right leg infection: continue antibiotics, normal saline wet to dry to below knee incision still with slight leukocytosis 4.  Transfer to 2W.  Emphasized to pt importance of bed alarm to prevent him from having a fall. 5.  Renal dysfunction creatinine stable 1.5 range will need to monitor 6.  Afib continue eliquis for now.  May need to stop if requires graft removal.   Lucas [name],Lucas [name] 09/07/2014 8:52 AM --  Laboratory Lab Results:  Recent Labs  09/06/14 1408 09/06/14 1454  WBC 11.4* 11.1*  HGB 12.2* 12.8*  HCT 38.4* 39.4  PLT 192 201   BMET  Recent Labs  09/06/14 1408 09/06/14 1454  NA 137 135  K 3.8 3.7  CL 104 102  CO2 25 25  GLUCOSE 105* 99  BUN 22 23  CREATININE 1.41* 1.49*  CALCIUM 9.2 8.8    COAG Lab Results  Component Value Date   INR 1.49 09/06/2014   INR 1.14 08/19/2014   INR 1.25 08/09/2014   No results found for: PTT

## 2014-09-07 NOTE — Progress Notes (Signed)
Pt is adamant about no having the bed alarm on. Explained to pt that it was for his own safety and that he was a high fall risk bc of all his extra wires. Pt angry about this and said he wants to transfer to [facility]. Spoke with Dr. Oneida Alar about this. Dr. Oneida Alar spoke with pt and explained everything. Pt still upset about the bed alarm. Will continue to monitor pt.

## 2014-09-08 LAB — BASIC METABOLIC PANEL
ANION GAP: 6 (ref 5–15)
BUN: 26 mg/dL — ABNORMAL HIGH (ref 6–23)
CALCIUM: 8.5 mg/dL (ref 8.4–10.5)
CO2: 28 mmol/L (ref 19–32)
Chloride: 102 mmol/L (ref 96–112)
Creatinine, Ser: 1.74 mg/dL — ABNORMAL HIGH (ref 0.50–1.35)
GFR calc Af Amer: 44 mL/min — ABNORMAL LOW (ref >90)
GFR calc non Af Amer: 38 mL/min — ABNORMAL LOW (ref >90)
Glucose, Bld: 114 mg/dL — ABNORMAL HIGH (ref 70–99)
POTASSIUM: 3.5 mmol/L (ref 3.5–5.1)
Sodium: 136 mmol/L (ref 135–145)

## 2014-09-08 LAB — CBC
HEMATOCRIT: 34.6 % — AB (ref 39.0–52.0)
HEMOGLOBIN: 11 g/dL — AB (ref 13.0–17.0)
MCH: 28.4 pg (ref 26.0–34.0)
MCHC: 31.8 g/dL (ref 30.0–36.0)
MCV: 89.2 fL (ref 78.0–100.0)
PLATELETS: 199 10*3/uL (ref 150–400)
RBC: 3.88 MIL/uL — ABNORMAL LOW (ref 4.22–5.81)
RDW: 16.8 % — ABNORMAL HIGH (ref 11.5–15.5)
WBC: 10 10*3/uL (ref 4.0–10.5)

## 2014-09-08 LAB — VANCOMYCIN, TROUGH: Vancomycin Tr: 19 ug/mL (ref 10.0–20.0)

## 2014-09-08 MED ORDER — NALOXONE HCL 0.4 MG/ML IJ SOLN: 0.4000 mg | INTRAMUSCULAR | Status: DC | PRN

## 2014-09-08 MED ORDER — HYDROMORPHONE HCL 2 MG PO TABS
4.0000 mg | ORAL_TABLET | ORAL | Status: DC | PRN
  Administered 2014-09-08 – 2014-09-11 (×6): 4 mg via ORAL
  Filled 2014-09-08 (×6): qty 2

## 2014-09-08 MED ORDER — SODIUM CHLORIDE 0.9 % IJ SOLN: 9.0000 mL | INTRAMUSCULAR | Status: DC | PRN

## 2014-09-08 MED ORDER — VANCOMYCIN HCL 10 G IV SOLR
1250.0000 mg | INTRAVENOUS | Status: DC
  Administered 2014-09-08 – 2014-09-10 (×3): 1250 mg via INTRAVENOUS
  Filled 2014-09-08 (×4): qty 1250

## 2014-09-08 MED ORDER — DIPHENHYDRAMINE HCL 12.5 MG/5ML PO ELIX
12.5000 mg | ORAL_SOLUTION | Freq: Four times a day (QID) | ORAL | Status: DC | PRN
  Filled 2014-09-08: qty 5

## 2014-09-08 MED ORDER — HYDROMORPHONE HCL 1 MG/ML IJ SOLN
1.0000 mg | INTRAMUSCULAR | Status: DC | PRN
  Administered 2014-09-08 – 2014-09-11 (×15): 1 mg via INTRAVENOUS
  Filled 2014-09-08 (×15): qty 1

## 2014-09-08 MED ORDER — DIPHENHYDRAMINE HCL 50 MG/ML IJ SOLN: 12.5000 mg | Freq: Four times a day (QID) | INTRAMUSCULAR | Status: DC | PRN

## 2014-09-08 MED ORDER — HYDROMORPHONE 0.3 MG/ML IV SOLN
INTRAVENOUS | Status: DC
  Filled 2014-09-08: qty 25

## 2014-09-08 MED ORDER — ONDANSETRON HCL 4 MG/2ML IJ SOLN: 4.0000 mg | Freq: Four times a day (QID) | INTRAMUSCULAR | Status: DC | PRN

## 2014-09-08 NOTE — Progress Notes (Signed)
ANTIBIOTIC CONSULT NOTE - FOLLOW UP  Pharmacy Consult for Vancomycin + Zosyn Indication: RLE Wound/Graft Infection  Allergies  Allergen Reactions  . Aspirin Other (See Comments)    Causes nose bleeds  . Ativan [Lorazepam] Other (See Comments)    Increases agitation, tolerates xanax  . Lisinopril Cough  . Morphine And Related Other (See Comments)    Hallucinations, too sedated when given with ativan  . Ibuprofen Hives, Nausea And Vomiting and Rash    Patient Measurements: Height: 5\' 9"  (175.3 cm) Weight: 198 lb 3.1 oz (89.9 kg) IBW/kg (Calculated) : 70.7  Vital Signs: Temp: 97.5 F (36.4 C) (02/28 0415) Temp Source: Oral (02/28 0415) BP: 122/62 mmHg (02/28 0415) Pulse Rate: 56 (02/28 0415) Intake/Output from previous day: 02/27 0701 - 02/28 0700 In: 1700 [P.O.:1200; IV Piggyback:500] Out: 825 [Urine:825] Intake/Output from this shift:    Labs:  Recent Labs  09/06/14 1408 09/06/14 1454 09/08/14 0518  WBC 11.4* 11.1* 10.0  HGB 12.2* 12.8* 11.0*  PLT 192 201 199  CREATININE 1.41* 1.49* 1.74*   Estimated Creatinine Clearance: 44.4 mL/min (by C-G formula based on Cr of 1.74). No results for input(s): VANCOTROUGH, VANCOPEAK, VANCORANDOM, GENTTROUGH, GENTPEAK, GENTRANDOM, TOBRATROUGH, TOBRAPEAK, TOBRARND, AMIKACINPEAK, AMIKACINTROU, AMIKACIN in the last 72 hours.   Microbiology: Recent Results (from the past 720 hour(s))  Culture, blood (routine x 2)     Status: None (Preliminary result)   Collection Time: 09/06/14  2:07 PM  Result Value Ref Range Status   Specimen Description BLOOD RIGHT ANTECUBITAL  Final   Special Requests BOTTLES DRAWN AEROBIC AND ANAEROBIC 5MLS  Final   Culture   Final           BLOOD CULTURE RECEIVED NO GROWTH TO DATE CULTURE WILL BE HELD FOR 5 DAYS BEFORE ISSUING A FINAL NEGATIVE REPORT Performed at [facility]    Report Status PENDING  Incomplete  Culture, blood (routine x 2)     Status: None (Preliminary result)   Collection  Time: 09/06/14  2:15 PM  Result Value Ref Range Status   Specimen Description BLOOD BLOOD LEFT FOREARM  Final   Special Requests BOTTLES DRAWN AEROBIC AND ANAEROBIC 5MLS  Final   Culture   Final           BLOOD CULTURE RECEIVED NO GROWTH TO DATE CULTURE WILL BE HELD FOR 5 DAYS BEFORE ISSUING A FINAL NEGATIVE REPORT Performed at [facility]    Report Status PENDING  Incomplete    Anti-infectives    Start     Dose/Rate Route Frequency Ordered Stop   09/07/14 0330  vancomycin (VANCOCIN) IVPB 750 mg/150 ml premix     750 mg 150 mL/hr over 60 Minutes Intravenous Every 12 hours 09/06/14 1515     09/06/14 1515  vancomycin (VANCOCIN) 1,500 mg in sodium chloride 0.9 % 500 mL IVPB     1,500 mg 250 mL/hr over 120 Minutes Intravenous  Once 09/06/14 1515 09/06/14 1843   09/06/14 1500  piperacillin-tazobactam (ZOSYN) IVPB 3.375 g     3.375 g 12.5 mL/hr over 240 Minutes Intravenous 3 times per day 09/06/14 1457     09/06/14 1500  levofloxacin (LEVAQUIN) tablet 750 mg  Status:  Discontinued     750 mg Oral Daily 09/06/14 1458 09/07/14 0852      Assessment: 36 YOM who continues on Vancomycin + Zosyn for empiric coverage for a RLE wound/graft infection. A Vancomycin trough this afternoon resulted as slightly SUPRAtherapeutic (VT 19 mcg/ml, goal of 10-15  ml/min). The patient's renal function has noted to worsen slightly, SCr 1.74 << 1.49, estimated CrCl~40 ml/min. Will reduce the dose today.  Goal of Therapy:  Vancomycin trough level 10-15 mcg/ml  Plan:  1. Reduce Vancomycin to 1250 mg IV every 24 hours (next dose due at 2200 today) 2. Continue Zosyn 3.375g IV every 8 hours (infused over 4 hours) 3. Will continue to follow renal function, culture results, LOT, and antibiotic de-escalation plans   Alycia Rossetti, PharmD, BCPS Clinical Pharmacist Pager: [phone] 09/08/2014 2:16 PM

## 2014-09-08 NOTE — Clinical Documentation Improvement (Signed)
  Patient admitted for wound infection and cellulitis.  "Sepsis" is documented in the ED physician's note.  Temp 101.8, BP 115/85, Pulse 102, Resp Rate 24, WBC 11.4 present on admission.   Lactic Acid in ED 0.75  and repeated 0.85.  Please document if the diagnosis of Sepsis was:   - Confirmed and is applicable to this admission   - Ruled out and not applicable to this admission.   - Unable to Clinically Determine  Thank You, Erling Conte ,RN Clinical Documentation Specialist:  [phone] [facility]

## 2014-09-08 NOTE — Progress Notes (Addendum)
[facility] [place]  Subjective  - Pain is an issue.  Over all he is doing well.   Objective 122/62 56 97.5 F (36.4 C) (Oral) 20 97%  Intake/Output Summary (Last 24 hours) at 09/08/14 0824 Last data filed at 09/08/14 0600  Gross per 24 hour  Intake   1700 ml  Output    825 ml  Net    875 ml   Chest x ray IMPRESSION: Bibasilar atelectasis and hyperinflation. No acute findings.  Right lower leg wound no cellulitis clear drainage, mon. Edema bilateral LE Doppler biphasic PT right foot Heart irregularly irregular On Eliquis Lungs Rales non labored breathing   Assessment/Planning: right LE edema erythema and sever pain s/p R LE fem BK Pop BPG with PTFE  Right LE resolved cellulitis, clear drainage distal incision BK.  On IV vanc and zosyn Wet to dry dressings COPD continue current nebs, saline lock IV to minimize fluid.  Will ck BMET in am Cr 1.74 up from 1.49 hx of nephrectomy single functioning kidney Afib continue eliquis for now. May need to stop if requires graft removal.   [name], EMMA [facility] 09/08/2014 8:24 AM -- Leg may be slightly improve from cellulitis standpoint Creatinine slowly rising recheck bmet in am Continue IV abx Dr Trula Slade to see tomorrow Check wound culture but may be low yield on antibiotics Will place on low dose Dilaudid PCA Lung function still poor seems dry at this point Will get pulmonary to see tomorrow.  He is followed by Pamelia Hoit, MD [facility] [place]: [phone] Pager: [phone]  Laboratory Lab Results:  Recent Labs  09/06/14 1454 09/08/14 0518  WBC 11.1* 10.0  HGB 12.8* 11.0*  HCT 39.4 34.6*  PLT 201 199   BMET  Recent Labs  09/06/14 1454 09/08/14 0518  NA 135 136  K 3.7 3.5  CL 102 102  CO2 25 28  GLUCOSE 99 114*  BUN 23 26*  CREATININE 1.49* 1.74*  CALCIUM 8.8 8.5    COAG Lab Results  Component Value Date   INR 1.49  09/06/2014   INR 1.14 08/19/2014   INR 1.25 08/09/2014   No results found for: PTT

## 2014-09-09 ENCOUNTER — Ambulatory Visit: Payer: Medicare Other | Admitting: Adult Health

## 2014-09-09 ENCOUNTER — Inpatient Hospital Stay (HOSPITAL_COMMUNITY): Payer: Medicare Other

## 2014-09-09 DIAGNOSIS — J432 Centrilobular emphysema: Secondary | ICD-10-CM

## 2014-09-09 DIAGNOSIS — I739 Peripheral vascular disease, unspecified: Secondary | ICD-10-CM

## 2014-09-09 DIAGNOSIS — L03115 Cellulitis of right lower limb: Secondary | ICD-10-CM

## 2014-09-09 LAB — BASIC METABOLIC PANEL
ANION GAP: 6 (ref 5–15)
BUN: 26 mg/dL — AB (ref 6–23)
CO2: 29 mmol/L (ref 19–32)
CREATININE: 1.7 mg/dL — AB (ref 0.50–1.35)
Calcium: 8.2 mg/dL — ABNORMAL LOW (ref 8.4–10.5)
Chloride: 102 mmol/L (ref 96–112)
GFR calc Af Amer: 46 mL/min — ABNORMAL LOW (ref >90)
GFR calc non Af Amer: 39 mL/min — ABNORMAL LOW (ref >90)
Glucose, Bld: 124 mg/dL — ABNORMAL HIGH (ref 70–99)
POTASSIUM: 3.8 mmol/L (ref 3.5–5.1)
SODIUM: 137 mmol/L (ref 135–145)

## 2014-09-09 LAB — CBC
HCT: 31.1 % — ABNORMAL LOW (ref 39.0–52.0)
HEMOGLOBIN: 9.9 g/dL — AB (ref 13.0–17.0)
MCH: 27.7 pg (ref 26.0–34.0)
MCHC: 31.8 g/dL (ref 30.0–36.0)
MCV: 86.9 fL (ref 78.0–100.0)
Platelets: 193 10*3/uL (ref 150–400)
RBC: 3.58 MIL/uL — ABNORMAL LOW (ref 4.22–5.81)
RDW: 16.5 % — AB (ref 11.5–15.5)
WBC: 9.2 10*3/uL (ref 4.0–10.5)

## 2014-09-09 LAB — MAGNESIUM: MAGNESIUM: 2.3 mg/dL (ref 1.5–2.5)

## 2014-09-09 LAB — TROPONIN I: Troponin I: 0.04 ng/mL — ABNORMAL HIGH (ref <0.031)

## 2014-09-09 LAB — CALCIUM, IONIZED: Calcium, Ion: 1.18 mmol/L (ref 1.12–1.32)

## 2014-09-09 MED ORDER — BUDESONIDE 0.25 MG/2ML IN SUSP
0.5000 mg | Freq: Two times a day (BID) | RESPIRATORY_TRACT | Status: DC
  Administered 2014-09-09 – 2014-09-11 (×4): 0.5 mg via RESPIRATORY_TRACT
  Filled 2014-09-09 (×6): qty 4

## 2014-09-09 MED ORDER — BUDESONIDE 0.25 MG/2ML IN SUSP
0.2500 mg | Freq: Two times a day (BID) | RESPIRATORY_TRACT | Status: DC
  Filled 2014-09-09 (×3): qty 2

## 2014-09-09 MED ORDER — IPRATROPIUM-ALBUTEROL 0.5-2.5 (3) MG/3ML IN SOLN
3.0000 mL | Freq: Four times a day (QID) | RESPIRATORY_TRACT | Status: DC
  Administered 2014-09-09: 3 mL via RESPIRATORY_TRACT
  Filled 2014-09-09 (×2): qty 3

## 2014-09-09 MED ORDER — IPRATROPIUM-ALBUTEROL 0.5-2.5 (3) MG/3ML IN SOLN
3.0000 mL | Freq: Four times a day (QID) | RESPIRATORY_TRACT | Status: DC
  Administered 2014-09-10 – 2014-09-11 (×5): 3 mL via RESPIRATORY_TRACT
  Filled 2014-09-09 (×6): qty 3

## 2014-09-09 NOTE — Progress Notes (Signed)
Pt had 19 beats V tach with spontaneous return to NSR. He was asleep. Awakened easily and answered questions appropriately. Dr. Oneida Alar notified and orders received to check blood electrolytes and troponin.

## 2014-09-09 NOTE — Plan of Care (Signed)
Problem: Phase I Progression Outcomes Goal: Weaning O2 to maintain Sats > 90% Outcome: Not Met (add Reason) Pt desaturates on room air to mid 80's. Remains on Oxygen at 2L.     

## 2014-09-09 NOTE — Consult Note (Signed)
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  PCCM consulted 2/29 for assist with COPD.    SIGNIFICANT EVENTS    STUDIES:  2D echo 08/10/14>>> EF40-45%, mod reduced systolic function, grade 2 diastolic dysfunction, mild/mod AR, severely dilated LA   HISTORY OF PRESENT ILLNESS: 69 yo male active smoker with extensive PMH including hx Gold 2 COPD on nocturnal O2, chronic sCHF, CAD s/p CABG, AFib, CKD III, stable RLL pulm nodule, PVD s/p R fem pop 07/26/14 returned 2/26 with increased RLE pain and edema and was admitted by vascular with incisional infection/cellulitis and started on abx and ongoing concern for possible need for return to OR.  PCCM consulted 2/29 for assist with COPD.   Has had slightly increased SOB and increased non productive cough since admission.  Denies chest pain, purulent sputum.  C/o RLE pain, swelling and erythema.    PAST MEDICAL HISTORY :   has a past medical history of CAD (coronary artery disease); Hypertension; Adjustment disorder with anxiety; Hyperlipidemia; BPH (benign prostatic hypertrophy); COPD (chronic obstructive pulmonary disease); Arthritis; Ischemic cardiomyopathy; Chronic systolic CHF (congestive heart failure); Prostate cancer (dx'd 2012); Renal cancer (dx'd 1997); CKD (chronic kidney disease), stage III; Tobacco abuse; Syncope; PAF (paroxysmal atrial fibrillation); Hypotension; Chronic pain; PAD (peripheral artery disease);  Pulmonary nodules; Anxiety (Dx 2004); Anginal pain; Shortness of breath dyspnea; Rheumatic fever; and Family history of adverse reaction to anesthesia.  has past surgical history that includes Coronary artery bypass graft; Cardiac catheterization; Nephrectomy; Posterior cervical laminectomy; heart stents; Robot assisted laparoscopic radical prostatectomy; Cystoscopy with litholapaxy (05/01/2012); prostate cancer; Kidney surgery (Left, 1994); left and right heart catheterization with coronary/graft angiogram (N/A, 04/24/2012); left and right heart catheterization with coronary angiogram (N/A, 08/21/2013); lower extremity angiogram (N/A, 08/21/2013); abdominal aortagram (N/A, 12/25/2013); left heart catheterization with coronary/graft angiogram (N/A, 04/19/2014); abdominal aortagram (N/A, 06/26/2014); and Femoral-popliteal Bypass Graft (Right, 07/26/2014). Prior to Admission medications   Medication Sig Start Date End Date Taking? Authorizing Provider  albuterol (PROVENTIL HFA;VENTOLIN HFA) 108 (90 BASE) MCG/ACT inhaler Inhale 2 puffs into the lungs every 6 (six) hours as needed for wheezing or shortness of breath. Shortness of breath Patient taking differently: Inhale 2 puffs into the lungs every 6 (six) hours as needed for wheezing or shortness of breath.  08/16/14  Yes Brand Males, MD  albuterol (PROVENTIL) (2.5 MG/3ML) 0.083% nebulizer solution Take 3 mLs (2.5 mg total) by nebulization 4 (four) times daily. Dx J44.1 05/31/14  Yes Brand Males, MD  ALPRAZolam Duanne Moron) 1 MG tablet Take 1 tablet (1 mg total) by mouth 3 (three) times daily. 08/11/14  Yes Theodis Blaze, MD  apixaban (ELIQUIS) 5 MG TABS tablet Take 1 tablet (5 mg total) by mouth 2 (two) times daily. 05/08/14  Yes Serafina Mitchell, MD  doxazosin (CARDURA) 8 MG tablet Take 1 tablet (8 mg total) by mouth at bedtime. 06/10/14  Yes Brand Males, MD  furosemide (LASIX) 40 MG tablet Take  1 tablet (40 mg total) by mouth 2 (two) times  daily. Patient taking differently: Take 80 mg by mouth 2 (two) times daily.  08/11/14  Yes Theodis Blaze, MD  HYDROcodone-acetaminophen (NORCO) 10-325 MG per tablet Take 1 tablet by mouth 3 (three) times daily. Patient taking differently: Take 1 tablet by mouth 5 (five) times daily.  06/27/14  Yes Samantha J Rhyne, PA-C  HYDROmorphone (DILAUDID) 4 MG tablet Take 1 tablet (4 mg total) by mouth every 4 (four) hours as needed for moderate pain. 08/11/14  Yes Theodis Blaze, MD  Ipratropium-Albuterol (COMBIVENT RESPIMAT) 20-100 MCG/ACT AERS respimat Inhale 1 puff into the lungs 4 (four) times daily. 08/16/14  Yes Brand Males, MD  levofloxacin (LEVAQUIN) 750 MG tablet Take 1 tablet (750 mg total) by mouth daily. 08/22/14  Yes Almyra Deforest, PA  mometasone-formoterol (DULERA) 100-5 MCG/ACT AERO Inhale 2 puffs into the lungs 2 (two) times daily. 06/10/14  Yes Brand Males, MD  nitroGLYCERIN (NITROSTAT) 0.3 MG SL tablet Place 0.3 mg under the tongue every 5 (five) minutes as needed for chest pain.   Yes Historical Provider, MD  predniSONE (DELTASONE) 10 MG tablet Take 50 mg tablet today and taper down by 10 mg daily until completed Patient taking differently: Take 10 mg by mouth See admin instructions. Take 1 tablet (10 mg) every morning; may take a 2nd tablet after 2pm if needed for chest pressure from fluid buildup 08/11/14  Yes Theodis Blaze, MD  moxifloxacin (AVELOX) 400 MG tablet Take 1 tablet (400 mg total) by mouth daily at 8 pm. Patient not taking: Reported on 09/06/2014 08/11/14   Theodis Blaze, MD   Allergies  Allergen Reactions  . Aspirin Other (See Comments)    Causes nose bleeds  . Ativan [Lorazepam] Other (See Comments)    Increases agitation, tolerates xanax  . Lisinopril Cough  . Morphine And Related Other (See Comments)    Hallucinations, too sedated when given with ativan  . Ibuprofen Hives, Nausea And Vomiting and Rash    FAMILY HISTORY:  family history includes COPD in his sister;  Coronary artery disease in an other family member; Diabetes in his father and mother; Heart attack in his brother, father, and mother; Heart disease in his brother, father, and mother; Hypertension in his mother. SOCIAL HISTORY:  reports that he has been smoking Cigarettes.  He has a 2.7 pack-year smoking history. He quit smokeless tobacco use about 2 years ago. He reports that he drinks alcohol. He reports that he does not use illicit drugs.  REVIEW OF SYSTEMS:   As per HPI - All other systems reviewed and were neg.    SUBJECTIVE:   VITAL SIGNS: Temp:  [97.5 F (36.4 C)-98.4 F (36.9 C)] 98.3 F (36.8 C) (02/29 0557) Pulse Rate:  [61-86] 63 (02/29 0557) Resp:  [18-20] 18 (02/29 0557) BP: (99-159)/(50-135) 114/60 mmHg (02/29 0557) SpO2:  [92 %-99 %] 96 % (02/29 0557) FiO2 (%):  [21 %-25 %] 21 % (02/28 1600) Weight:  [224 lb 3.3 oz (101.7 kg)] 224 lb 3.3 oz (101.7 kg) (02/29 0243)  PHYSICAL EXAMINATION: General:  Chronically ill appearing male, NAD sitting on side of bed  Neuro:  Awake, alert, appropriate, MAE, walks with cane  HEENT:  Mm moist, no JVD Cardiovascular:  s1s2 rrr Lungs:  resps even, non labored on RA, diminished bases, mild exp wheeze throughout  Abdomen:  Round, soft, non tender Musculoskeletal:  RLE erythematous, warm, dressing intact     Recent  Labs Lab 09/06/14 1454 09/08/14 0518 09/09/14 0048  NA 135 136 137  K 3.7 3.5 3.8  CL 102 102 102  CO2 25 28 29   BUN 23 26* 26*  CREATININE 1.49* 1.74* 1.70*  GLUCOSE 99 114* 124*    Recent Labs Lab 09/06/14 1454 09/08/14 0518 09/09/14 0048  HGB 12.8* 11.0* 9.9*  HCT 39.4 34.6* 31.1*  WBC 11.1* 10.0 9.2  PLT 201 199 193   Dg Chest 2 View  09/07/2014   CLINICAL DATA:  Short of breath  EXAM: CHEST  2 VIEW  COMPARISON:  09/06/2014  FINDINGS: Sternotomy wires overlie normal cardiac silhouette. There is bibasilar atelectasis. Lungs are hyperinflated. No pulmonary edema. No infiltrate. Degenerative  osteophytosis of the thoracic spine.  IMPRESSION: Bibasilar atelectasis and hyperinflation.  No acute findings.   Electronically Signed   By: Suzy Bouchard M.D.   On: 09/07/2014 16:59    ASSESSMENT / PLAN:  COPD - gold 2 without acute exacerbation  Tobacco abuse  Chronic combined CHF  CKD III PLAN -  CXR now  Supplemental O2 to keep sats 88-92%  Cont duonebs - change to q6  D/c dulera for now, pt says difficult for him when he is SOB Will add Budesonide BID Flutter valve Cont scheduled lasix   Cont maintenance pred (10mg /day), avoid increased steroids for now as able with wound   RLE Cellulitis  PVD s/p RLE fem pop  PLAN - Per [facility] For [facility] placement today  May need return to [place], NP 09/09/2014  10:09 AM Pager: [phone] or [phone]   Attending:  I have seen and examined the patient with nurse practitioner/resident and agree with the note above.   Lucas [name] is a pleasant 69 y/o male with COPD followed by my partner.  He says that his breathing has been a little more labored since he found out that he may need to have a BKA, but he has not been producing more mucus than usual.  On exam: lungs with poor air movement, some wheezing, but comfortable, no distress; redness and swelling of right leg noted  COPD> he is not in exacerbation right now, we will add pulmicort and scheduled duonebs today for ease of use as he feels he is not getting enough of the [facility]; will continue scheduled duoneb and try to transition to brovana tomorrow with prn duoneb  Cellulitis with peripheral vascular disease> hopefully can avoid surgery, agree with vancomycin  Appreciate consult, will follow  Roselie Awkward, MD [place] P[facility]M Pager: [phone] Cell: [phone] If no response, call [phone]

## 2014-09-09 NOTE — Progress Notes (Signed)
Medicare Important Message given? YES (If response is "NO", the following Medicare IM given date fields will be blank) Date Medicare IM given:09/09/2014 Medicare IM given by: Cedricka Sackrider 

## 2014-09-09 NOTE — Progress Notes (Addendum)
   [facility]  Subjective  - Still complaining of pain issues.     Objective 114/60 63 98.3 F (36.8 C) (Oral) 18 96%  Intake/Output Summary (Last 24 hours) at 09/09/14 0901 Last data filed at 09/09/14 0557  Gross per 24 hour  Intake    890 ml  Output    725 ml  Net    165 ml   Right LE clear daring, wet to dry dressing daily Mod LE edema bil. Doppler PT signal Lungs coarse breath sounds, non labored breathing    Assessment/Planning: 07/26/2014 right LE edema erythema and sever pain s/p R LE fem BK Pop BPG with PTFE  Continue current treatment vanco/zosyn IV Chronic systolic heart failure with EF 40-45% Called pulmonary consult on going chronic COPD on home oxygen Dr. Trula Slade to see today for any treatment changes  [name] [facility] 09/09/2014 9:01 AM --  Laboratory Lab Results:  Recent Labs  09/08/14 0518 09/09/14 0048  WBC 10.0 9.2  HGB 11.0* 9.9*  HCT 34.6* 31.1*  PLT 199 193   BMET  Recent Labs  09/08/14 0518 09/09/14 0048  NA 136 137  K 3.5 3.8  CL 102 102  CO2 28 29  GLUCOSE 114* 124*  BUN 26* 26*  CREATININE 1.74* 1.70*  CALCIUM 8.5 8.2*    COAG Lab Results  Component Value Date   INR 1.49 09/06/2014   INR 1.14 08/19/2014   INR 1.25 08/09/2014   No results found for: PTT    I agree with the above.  The patient has a good posterior tibial Doppler signal.  I'm going to place a wound VAC on his below knee incision.  We'll ask pulmonary for evaluation.  Continue IV antibiotics.   Annamarie Major

## 2014-09-09 NOTE — Progress Notes (Signed)
Utilization review completed. Camron Monday, RN, BSN. 

## 2014-09-10 DIAGNOSIS — I5042 Chronic combined systolic (congestive) and diastolic (congestive) heart failure: Secondary | ICD-10-CM

## 2014-09-10 LAB — BASIC METABOLIC PANEL
Anion gap: 9 (ref 5–15)
BUN: 20 mg/dL (ref 6–23)
CALCIUM: 8.5 mg/dL (ref 8.4–10.5)
CO2: 33 mmol/L — AB (ref 19–32)
CREATININE: 1.51 mg/dL — AB (ref 0.50–1.35)
Chloride: 100 mmol/L (ref 96–112)
GFR calc Af Amer: 53 mL/min — ABNORMAL LOW (ref >90)
GFR calc non Af Amer: 45 mL/min — ABNORMAL LOW (ref >90)
GLUCOSE: 92 mg/dL (ref 70–99)
Potassium: 3.2 mmol/L — ABNORMAL LOW (ref 3.5–5.1)
Sodium: 142 mmol/L (ref 135–145)

## 2014-09-10 LAB — CBC
HCT: 35.5 % — ABNORMAL LOW (ref 39.0–52.0)
HEMOGLOBIN: 11 g/dL — AB (ref 13.0–17.0)
MCH: 27.9 pg (ref 26.0–34.0)
MCHC: 31 g/dL (ref 30.0–36.0)
MCV: 90.1 fL (ref 78.0–100.0)
Platelets: 238 10*3/uL (ref 150–400)
RBC: 3.94 MIL/uL — ABNORMAL LOW (ref 4.22–5.81)
RDW: 16.3 % — ABNORMAL HIGH (ref 11.5–15.5)
WBC: 6.6 10*3/uL (ref 4.0–10.5)

## 2014-09-10 MED ORDER — FUROSEMIDE 20 MG PO TABS
20.0000 mg | ORAL_TABLET | Freq: Every day | ORAL | Status: DC
  Administered 2014-09-11: 20 mg via ORAL
  Filled 2014-09-10 (×2): qty 1

## 2014-09-10 MED ORDER — NICOTINE 14 MG/24HR TD PT24
14.0000 mg | MEDICATED_PATCH | Freq: Every day | TRANSDERMAL | Status: DC
  Administered 2014-09-10 – 2014-09-11 (×2): 14 mg via TRANSDERMAL
  Filled 2014-09-10 (×2): qty 1

## 2014-09-10 MED ORDER — POTASSIUM CHLORIDE CRYS ER 10 MEQ PO TBCR
10.0000 meq | EXTENDED_RELEASE_TABLET | Freq: Two times a day (BID) | ORAL | Status: AC
  Administered 2014-09-10 (×2): 10 meq via ORAL
  Filled 2014-09-10 (×2): qty 1

## 2014-09-10 MED ORDER — FUROSEMIDE 40 MG PO TABS
40.0000 mg | ORAL_TABLET | Freq: Every day | ORAL | Status: DC
  Administered 2014-09-10: 40 mg via ORAL
  Filled 2014-09-10 (×2): qty 1

## 2014-09-10 NOTE — Progress Notes (Signed)
Name: Lucas [name] MRN: [id number] DOB: 1946/06/01    ADMISSION DATE:  09/06/2014 CONSULTATION DATE:  2/29  REFERRING MD :  Trula Slade   CHIEF COMPLAINT:  COPD   BRIEF PATIENT DESCRIPTION: 69 yo male active smoker with extensive PMH including hx Gold 2 COPD (followed by MR) on nocturnal O2, chronic sCHF, CAD s/p CABG, AFib, CKD III, stable RLL pulm nodule, PVD s/p R fem pop 07/26/14 returned 2/26 with increased RLE pain and edema and was admitted by vascular with incisional infection/cellulitis and started on abx and ongoing concern for possible need for return to OR.  PCCM consulted 2/29 for assist with COPD.    SIGNIFICANT EVENTS    STUDIES:  2D echo 08/10/14>>> EF40-45%, mod reduced systolic function, grade 2 diastolic dysfunction, mild/mod AR, severely dilated LA   HISTORY OF PRESENT ILLNESS: 69 yo male active smoker with extensive PMH including hx Gold 2 COPD on nocturnal O2, chronic sCHF, CAD s/p CABG, AFib, CKD III, stable RLL pulm nodule, PVD s/p R fem pop 07/26/14 returned 2/26 with increased RLE pain and edema and was admitted by vascular with incisional infection/cellulitis and started on abx and ongoing concern for possible need for return to OR.  PCCM consulted 2/29 for assist with COPD.   Has had slightly increased SOB and increased non productive cough since admission.  Denies chest pain, purulent sputum.  C/o RLE pain, swelling and erythema.    PAST MEDICAL HISTORY :   has a past medical history of CAD (coronary artery disease); Hypertension; Adjustment disorder with anxiety; Hyperlipidemia; BPH (benign prostatic hypertrophy); COPD (chronic obstructive pulmonary disease); Arthritis; Ischemic cardiomyopathy; Chronic systolic CHF (congestive heart failure); Prostate cancer (dx'd 2012); Renal cancer (dx'd 1997); CKD (chronic kidney disease), stage III; Tobacco abuse; Syncope; PAF (paroxysmal atrial fibrillation); Hypotension; Chronic pain; PAD (peripheral artery disease);  Pulmonary nodules; Anxiety (Dx 2004); Anginal pain; Shortness of breath dyspnea; Rheumatic fever; and Family history of adverse reaction to anesthesia.  has past surgical history that includes Coronary artery bypass graft; Cardiac catheterization; Nephrectomy; Posterior cervical laminectomy; heart stents; Robot assisted laparoscopic radical prostatectomy; Cystoscopy with litholapaxy (05/01/2012); prostate cancer; Kidney surgery (Left, 1994); left and right heart catheterization with coronary/graft angiogram (N/A, 04/24/2012); left and right heart catheterization with coronary angiogram (N/A, 08/21/2013); lower extremity angiogram (N/A, 08/21/2013); abdominal aortagram (N/A, 12/25/2013); left heart catheterization with coronary/graft angiogram (N/A, 04/19/2014); abdominal aortagram (N/A, 06/26/2014); and Femoral-popliteal Bypass Graft (Right, 07/26/2014). Prior to Admission medications   Medication Sig Start Date End Date Taking? Authorizing Provider  albuterol (PROVENTIL HFA;VENTOLIN HFA) 108 (90 BASE) MCG/ACT inhaler Inhale 2 puffs into the lungs every 6 (six) hours as needed for wheezing or shortness of breath. Shortness of breath Patient taking differently: Inhale 2 puffs into the lungs every 6 (six) hours as needed for wheezing or shortness of breath.  08/16/14  Yes Brand Males, MD  albuterol (PROVENTIL) (2.5 MG/3ML) 0.083% nebulizer solution Take 3 mLs (2.5 mg total) by nebulization 4 (four) times daily. Dx J44.1 05/31/14  Yes Brand Males, MD  ALPRAZolam Duanne Moron) 1 MG tablet Take 1 tablet (1 mg total) by mouth 3 (three) times daily. 08/11/14  Yes Theodis Blaze, MD  apixaban (ELIQUIS) 5 MG TABS tablet Take 1 tablet (5 mg total) by mouth 2 (two) times daily. 05/08/14  Yes Serafina Mitchell, MD  doxazosin (CARDURA) 8 MG tablet Take 1 tablet (8 mg total) by mouth at bedtime. 06/10/14  Yes Brand Males, MD  furosemide (LASIX) 40 MG tablet Take  1 tablet (40 mg total) by mouth 2 (two) times  daily. Patient taking differently: Take 80 mg by mouth 2 (two) times daily.  08/11/14  Yes Theodis Blaze, MD  HYDROcodone-acetaminophen (NORCO) 10-325 MG per tablet Take 1 tablet by mouth 3 (three) times daily. Patient taking differently: Take 1 tablet by mouth 5 (five) times daily.  06/27/14  Yes Samantha J Rhyne, PA-C  HYDROmorphone (DILAUDID) 4 MG tablet Take 1 tablet (4 mg total) by mouth every 4 (four) hours as needed for moderate pain. 08/11/14  Yes Theodis Blaze, MD  Ipratropium-Albuterol (COMBIVENT RESPIMAT) 20-100 MCG/ACT AERS respimat Inhale 1 puff into the lungs 4 (four) times daily. 08/16/14  Yes Brand Males, MD  levofloxacin (LEVAQUIN) 750 MG tablet Take 1 tablet (750 mg total) by mouth daily. 08/22/14  Yes Almyra Deforest, PA  mometasone-formoterol (DULERA) 100-5 MCG/ACT AERO Inhale 2 puffs into the lungs 2 (two) times daily. 06/10/14  Yes Brand Males, MD  nitroGLYCERIN (NITROSTAT) 0.3 MG SL tablet Place 0.3 mg under the tongue every 5 (five) minutes as needed for chest pain.   Yes Historical Provider, MD  predniSONE (DELTASONE) 10 MG tablet Take 50 mg tablet today and taper down by 10 mg daily until completed Patient taking differently: Take 10 mg by mouth See admin instructions. Take 1 tablet (10 mg) every morning; may take a 2nd tablet after 2pm if needed for chest pressure from fluid buildup 08/11/14  Yes Theodis Blaze, MD  moxifloxacin (AVELOX) 400 MG tablet Take 1 tablet (400 mg total) by mouth daily at 8 pm. Patient not taking: Reported on 09/06/2014 08/11/14   Theodis Blaze, MD   Allergies  Allergen Reactions  . Aspirin Other (See Comments)    Causes nose bleeds  . Ativan [Lorazepam] Other (See Comments)    Increases agitation, tolerates xanax  . Lisinopril Cough  . Morphine And Related Other (See Comments)    Hallucinations, too sedated when given with ativan  . Ibuprofen Hives, Nausea And Vomiting and Rash    FAMILY HISTORY:  family history includes COPD in his sister;  Coronary artery disease in an other family member; Diabetes in his father and mother; Heart attack in his brother, father, and mother; Heart disease in his brother, father, and mother; Hypertension in his mother. SOCIAL HISTORY:  reports that he has been smoking Cigarettes.  He has a 2.7 pack-year smoking history. He quit smokeless tobacco use about 2 years ago. He reports that he drinks alcohol. He reports that he does not use illicit drugs.  REVIEW OF SYSTEMS:   As per HPI - All other systems reviewed and were neg.    SUBJECTIVE:   VITAL SIGNS: Temp:  [97.8 F (36.6 C)-98.7 F (37.1 C)] 98.7 F (37.1 C) (03/01 0509) Pulse Rate:  [60-80] 80 (03/01 0509) Resp:  [18] 18 (03/01 0509) BP: (128-139)/(64-86) 139/86 mmHg (03/01 0509) SpO2:  [91 %-98 %] 93 % (03/01 0834) Weight:  [102.195 kg (225 lb 4.8 oz)] 102.195 kg (225 lb 4.8 oz) (03/01 0509)  PHYSICAL EXAMINATION: General:  Chronically ill appearing male, NAD sitting on side of bed  Neuro:  Awake, alert, appropriate, MAE, walks with cane  HEENT:  Mm moist, no JVD Cardiovascular:  s1s2 rrr Lungs:  resps even, non labored on RA, diminished bases, mild exp wheeze throughout  Abdomen:  Round, soft, non tender Musculoskeletal:  RLE erythematous, warm, dressing intact     Recent Labs Lab 09/08/14 0518 09/09/14 0048 09/10/14 0411  NA  136 137 142  K 3.5 3.8 3.2*  CL 102 102 100  CO2 28 29 33*  BUN 26* 26* 20  CREATININE 1.74* 1.70* 1.51*  GLUCOSE 114* 124* 92    Recent Labs Lab 09/08/14 0518 09/09/14 0048 09/10/14 0411  HGB 11.0* 9.9* 11.0*  HCT 34.6* 31.1* 35.5*  WBC 10.0 9.2 6.6  PLT 199 193 238   Dg Chest 2 View  09/09/2014   CLINICAL DATA:  Shortness of breath, COPD, right lower extremity pain  EXAM: CHEST  2 VIEW  COMPARISON:  09/07/2014  FINDINGS: There is bilateral mild interstitial thickening. There is no pleural effusion or pneumothorax. There is stable cardiomegaly. There is evidence of prior CABG.  The  osseous structures are unremarkable.  IMPRESSION: Stable cardiomegaly with mild bilateral interstitial thickening concerning for mild CHF.   Electronically Signed   By: Kathreen Devoid   On: 09/09/2014 11:06    ASSESSMENT / PLAN:  COPD - gold 2 without acute exacerbation, improved after adding pulmicort and scheduled duoneb Tobacco abuse  Chronic combined CHF, not in exacerbation CKD III PLAN -   In hospital: Add nicotine patch, counsel to quit smoking Supplemental O2 to keep sats 88-92%  Cont duonebs - q6 while in hospital Continue Budesonide BID while in hospital Flutter valve Cont scheduled lasix   Cont maintenance pred (10mg /day), avoid increased steroids for now as able with wound  At discharge: Resume dulera 2 puffs bid + prn albuterol   RLE Cellulitis  PVD s/p RLE fem pop  PLAN - Per VVS VAC placement pending  Please contact if he needs to go to the OR as we will be happy to help, otherwise we will be available PRN   Roselie Awkward, MD [place] [facility] Pager: [phone] Cell: [phone] If no response, call [phone]

## 2014-09-10 NOTE — Care Management Note (Signed)
    Page 1 of 1   09/10/2014     4:38:55 PM CARE MANAGEMENT NOTE 09/10/2014  Patient:  Lucas [name], Lucas [name]   Account Number:  [id number]  Date Initiated:  09/09/2014  Documentation initiated by:  Marvetta Gibbons  Subjective/Objective Assessment:   Pt admitted with cellulitis     Action/Plan:   PTA pt lived at home- active with [facility] services   Anticipated DC Date:  09/12/2014   Anticipated DC Plan:  [place]         [facility] Choice  Resumption Of Svcs/PTA Provider   Choice offered to / List presented to:             [place].   Status of service:  In process, will continue to follow Medicare Important Message given?  YES (If response is "NO", the following Medicare IM given date fields will be blank) Date Medicare IM given:  09/09/2014 Medicare IM given by:  Whitman Hero Date Additional Medicare IM given:   Additional Medicare IM given by:    Discharge Disposition:    Per UR Regulation:  Reviewed for med. necessity/level of care/duration of stay  If discussed at [place] of Stay Meetings, dates discussed:    Comments:  09/10/14- 1200- Marvetta Gibbons RN, BSN [phone] Referral for home Wound VAC- call made to [place] with [employer] regarding home wound VAC needs- initial paperwork faxed- awaiting order to be signed- will fax to [facility] once order signed and [place] note available. 1600- update- order and WOC note faxed to [place] with [employer]- plan for d/c home tomorrow with wound VAC once approved through [facility]

## 2014-09-10 NOTE — Consult Note (Signed)
WOC wound consult note Reason for Consult: placement of NPWT VAC dressing to the right LE Wound type: surgical  Measurement: 5cm x 2.5cm x 1.5cm  Wound bed:90% yellow slough/fibrin, 10% pink  Drainage (amount, consistency, odor) minimal, no odor  Periwound: intact, 2+ edema in the RLE Dressing procedure/placement/frequency: 1pc of black foam used to fill the wound bed, drape and mushroom of black foam used to accommodate the [facility] pad. Draped and sealed at 111mmHG. Pt tolerated quite well.  Received pain meds prior to dressing change.   WOC will follow along with you for VAC changes T/Th/Sat, plans possibly for DC tom per patient.  Bedside nursing staff to connect to home [facility] unit and [facility] to change again on Thursday and Saturday.    Marsela Kuan [place] RN,CWOCN [phone]

## 2014-09-10 NOTE — Progress Notes (Addendum)
  Progress Note    09/10/2014 8:59 AM * No surgery found *  Subjective:  C/o pain; wants to go home  Afebrile HR 60's-80's NSR 494'W-967'R systolic 91% [id number] (received breathing treatment this am)  Abx:   Vanc  Zosyn  Filed Vitals:   09/10/14 0509  BP: 139/86  Pulse: 80  Temp: 98.7 F (37.1 C)  Resp: 18    Physical Exam: Incisions:  Left leg wound is 1.5cm deep; 2.5cm wide; 5cm in length; very minimal granulation tissue.  Fibrinous tissue present throughout wound. Clear drainage present throughout wound Extremities:  Left foot with + PT/AT/peroneal signals   CBC    Component Value Date/Time   WBC 6.6 09/10/2014 0411   RBC 3.94* 09/10/2014 0411   HGB 11.0* 09/10/2014 0411   HCT 35.5* 09/10/2014 0411   PLT 238 09/10/2014 0411   MCV 90.1 09/10/2014 0411   MCH 27.9 09/10/2014 0411   MCHC 31.0 09/10/2014 0411   RDW 16.3* 09/10/2014 0411   LYMPHSABS 0.5* 09/06/2014 1408   MONOABS 1.0 09/06/2014 1408   EOSABS 0.1 09/06/2014 1408   BASOSABS 0.0 09/06/2014 1408    BMET    Component Value Date/Time   NA 142 09/10/2014 0411   K 3.2* 09/10/2014 0411   CL 100 09/10/2014 0411   CO2 33* 09/10/2014 0411   GLUCOSE 92 09/10/2014 0411   BUN 20 09/10/2014 0411   CREATININE 1.51* 09/10/2014 0411   CREATININE 1.19 08/27/2014 0958   CALCIUM 8.5 09/10/2014 0411   GFRNONAA 45* 09/10/2014 0411   GFRAA 53* 09/10/2014 0411    INR    Component Value Date/Time   INR 1.49 09/06/2014 1454     Intake/Output Summary (Last 24 hours) at 09/10/14 0859 Last data filed at 09/10/14 0605  Gross per 24 hour  Intake   1160 ml  Output   2000 ml  Net   -840 ml     Assessment:  69 y.o. male is s/p:  right LE edema erythema and sever pain s/p R LE fem BK Pop BPG with PTFE on 07/26/14    Plan: -pts wound is slow to heal with minimal granulation tissue -+ doppler signals left PT/AT/peroneal -DVT prophylaxis:  Eliquis -continue wet to dry dressing changes for now, however wound  vac was ordered yesterday. -pt started back on lasix 40mg  bid.  He states he takes 40mg  at night and 20mg  qam.  We will resume this schedule.  He is not on 80mg  as worded in the orders-he states this was reduced due to his kidney function. -hypokalemia-will gently supplement since Cr is 1.5.  Recheck labs in am -continue ABx   Leontine Locket, PA-C [facility] [phone] 09/10/2014 8:59 AM    I agree with the above.  The patient is feeling better today.  He will be discharged home with Augmentin for 2 weeks and wound VAC.  Annamarie Major

## 2014-09-11 LAB — BASIC METABOLIC PANEL
ANION GAP: 8 (ref 5–15)
BUN: 18 mg/dL (ref 6–23)
CO2: 31 mmol/L (ref 19–32)
Calcium: 8.6 mg/dL (ref 8.4–10.5)
Chloride: 103 mmol/L (ref 96–112)
Creatinine, Ser: 1.54 mg/dL — ABNORMAL HIGH (ref 0.50–1.35)
GFR calc non Af Amer: 44 mL/min — ABNORMAL LOW (ref >90)
GFR, EST AFRICAN AMERICAN: 51 mL/min — AB (ref >90)
Glucose, Bld: 106 mg/dL — ABNORMAL HIGH (ref 70–99)
POTASSIUM: 3.6 mmol/L (ref 3.5–5.1)
SODIUM: 142 mmol/L (ref 135–145)

## 2014-09-11 LAB — CBC
HCT: 33.7 % — ABNORMAL LOW (ref 39.0–52.0)
Hemoglobin: 10.4 g/dL — ABNORMAL LOW (ref 13.0–17.0)
MCH: 27.9 pg (ref 26.0–34.0)
MCHC: 30.9 g/dL (ref 30.0–36.0)
MCV: 90.3 fL (ref 78.0–100.0)
Platelets: 243 10*3/uL (ref 150–400)
RBC: 3.73 MIL/uL — ABNORMAL LOW (ref 4.22–5.81)
RDW: 16.5 % — AB (ref 11.5–15.5)
WBC: 6.3 10*3/uL (ref 4.0–10.5)

## 2014-09-11 MED ORDER — AMOXICILLIN-POT CLAVULANATE 250-62.5 MG/5ML PO SUSR: 875.0000 mg | Freq: Two times a day (BID) | ORAL | Status: DC

## 2014-09-11 MED ORDER — HYDROMORPHONE HCL 4 MG PO TABS: 4.0000 mg | ORAL_TABLET | ORAL | Status: DC | PRN

## 2014-09-11 NOTE — Progress Notes (Signed)
  [facility] Progress Note  09/11/2014 11:25 AM  Subjective:  Wants to go home.   Filed Vitals:   09/11/14 0302  BP: 137/73  Pulse: 75  Temp: 98 F (36.7 C)  Resp: 20    Physical Exam: Incisions:  Right lower leg incision wound vac seal intact.  Extremities:  Doppler signals right PT, DP and peroneal.   CBC    Component Value Date/Time   WBC 6.3 09/11/2014 0439   RBC 3.73* 09/11/2014 0439   HGB 10.4* 09/11/2014 0439   HCT 33.7* 09/11/2014 0439   PLT 243 09/11/2014 0439   MCV 90.3 09/11/2014 0439   MCH 27.9 09/11/2014 0439   MCHC 30.9 09/11/2014 0439   RDW 16.5* 09/11/2014 0439   LYMPHSABS 0.5* 09/06/2014 1408   MONOABS 1.0 09/06/2014 1408   EOSABS 0.1 09/06/2014 1408   BASOSABS 0.0 09/06/2014 1408    BMET    Component Value Date/Time   NA 142 09/11/2014 0439   K 3.6 09/11/2014 0439   CL 103 09/11/2014 0439   CO2 31 09/11/2014 0439   GLUCOSE 106* 09/11/2014 0439   BUN 18 09/11/2014 0439   CREATININE 1.54* 09/11/2014 0439   CREATININE 1.19 08/27/2014 0958   CALCIUM 8.6 09/11/2014 0439   GFRNONAA 44* 09/11/2014 0439   GFRAA 51* 09/11/2014 0439    INR    Component Value Date/Time   INR 1.49 09/06/2014 1454     Intake/Output Summary (Last 24 hours) at 09/11/14 1125 Last data filed at 09/11/14 0800  Gross per 24 hour  Intake    480 ml  Output    900 ml  Net   -420 ml     Assessment:  69 y.o. male is s/p: right LE edema erythema and sever pain s/p R LE fem BK Pop BPG with PTFE on 07/26/14   Plan: -Doppler signals of right PT, DP and peroneal. -Creatinine remains stable.  -Patient is feeling ready to go home.  -Discharge home today with Augmentin for 2 weeks and negative pressure wound therapy.     Virgina Jock, PA-C [facility] Office: [phone] Pager: [phone] 09/11/2014 11:25 AM

## 2014-09-11 NOTE — Plan of Care (Signed)
Problem: Phase II Progression Outcomes Goal: Reinforce elevate foot when in chair Outcome: Not Progressing Patient educated and refused.

## 2014-09-11 NOTE — Progress Notes (Signed)
09/11/2014 4:02 PM Discharge AVS meds taken today and those due this evening reviewed.  Follow-up appointments and when to call md reviewed.  D/C IV and TELE.  Questions and concerns addressed.  Wet to dry dressing placed on right leg wound.  Instructed pt. That home health nurses will be out to manage wound at home.  D/C home per orders.

## 2014-09-11 NOTE — Progress Notes (Signed)
CARE MANAGEMENT NOTE 09/11/2014  Patient:  Lucas [name], Lucas [name]   Account Number:  [id number]  Date Initiated:  09/09/2014  Documentation initiated by:  Marvetta Gibbons  Subjective/Objective Assessment:   Pt admitted with cellulitis     Action/Plan:   PTA pt lived at home- active with [facility] for [facility] services   Anticipated DC Date:  09/12/2014   Anticipated DC Plan:  [place]         [facility] Choice  Resumption Of Svcs/PTA Provider   Choice offered to / List presented to:     DME arranged  [facility]      DME agency  [place] arranged  HH-1 RN      [place].   Status of service:  Completed, signed off Medicare Important Message given?  YES (If response is "NO", the following Medicare IM given date fields will be blank) Date Medicare IM given:  09/09/2014 Medicare IM given by:  Whitman Hero Date Additional Medicare IM given:   Additional Medicare IM given by:    Discharge Disposition:    Per UR Regulation:  Reviewed for med. necessity/level of care/duration of stay  If discussed at [place] of Stay Meetings, dates discussed:    Comments:  09/11/14- 1030- Marvetta Gibbons RN, BSN [phone] Spoke with Miranda with [facility] regarding resumption of HH-RN orders- and pt to go home with wound VAC-  awaiting word from [facility]- have spoken with Rickie this AM and order in process. update- received call from PA- Dellie Catholic- per conversation they have spoken with [employer] and pt will not be approved for a home VAC- they would like  CM to check with [facility] regarding their  home neg pressure wound device and see if pt would be approved- call made to [facility] with [facility] regarding need for home neg. pressure device- she is to bring order form needed and CM will f/u for MD signature- once MD signs form [facility] will f/u-. 1530- Update- order has been signed and given to [facility] with [facility]- per Jeannetta Nap pt should be approved for [facility] with [facility]- plan is to have HH-RN see pt  09/12/14 to apply VAC in home- pt to d/c with wet-dry drsg per MD. Have spoken with pt and he is aware of plans-  Rickie with [employer] has also called in f/u regarding denial for home VAC with [employer]  09/10/14- 1200- Marvetta Gibbons RN, BSN [phone] Referral for home Wound VAC- call made to [place] with [employer] regarding home wound VAC needs- initial paperwork faxed- awaiting order to be signed- will fax to [facility] once order signed and [place] note available. 1600- update- order and WOC note faxed to [place] with [employer]- plan for d/c home tomorrow with wound VAC once approved through [facility]

## 2014-09-12 ENCOUNTER — Telehealth: Payer: Self-pay | Admitting: Surgery

## 2014-09-12 LAB — CULTURE, BLOOD (ROUTINE X 2)
Culture: NO GROWTH
Culture: NO GROWTH

## 2014-09-12 NOTE — Telephone Encounter (Addendum)
-----   Message from Mena Goes, RN sent at 09/11/2014 11:40 AM EST ----- Regarding: Schedule   ----- Message -----    From: Alvia Grove, PA-C    Sent: 09/11/2014  11:31 AM      To: Vvs Charge Pool  right LE edema erythema and sever pain s/p R LE fem BK Pop BPG with PTFE on 9/48/54  Application of wound vac this admission  F/u with Dr. Trula Slade in 2 weeks.   Thanks Kim   09/12/14: unable to reach pt- pts phone is not accepting calls at this time, dpm

## 2014-09-12 NOTE — Discharge Summary (Signed)
[facility] Discharge Summary  Lucas [name] 1945-09-24 69 y.o. male  [id number]  Admission Date: 09/06/2014  Discharge Date: 09/11/2014  Physician: Harold Barban, MD  Admission Diagnosis: Cellulitis of right lower extremity [L03.115] Sepsis, due to unspecified organism [A41.9]  HPI:   This is a 69 y.o. male who is s/p LE fem BK Pop BPG with PTFE 07/26/2014. He has reported increased pain and edema just since yesterday. He was seen in the office by Dr. Trula Slade and was in stable condition. He is a chronically ill male with multiple issues. These include COPD with chronic respiratory failure on home oxygen at night, chronic systolic HF with EF of 40 to 45%, remote renal cancer with past nephrectomy with CKD stage III, CAD with past CABG with his LIMA to the LAD patent and the SVG to OM3 but all other SVGs remain occluded. His other issues include anxiety, PAF and long standing tobacco abuse, PAD with past angioplasty in June of 2015 and chronic pain. Numerous hospitalizations. He is now on chronic anticoagulation but has had issues in the past with affording the Eliquis.   Hospital Course:  The patient was admitted to the hospital on 09/06/14 for IV antibiotics due to concern for graft infection. He had left leg doppler signals. He had some chest pain that resolved. His EKG had no acute changes with no elevations in troponin. He was continued on his home nebs for COPD. Wet to dry dressings were applied to his right below knee incision. He had a mild leukocytosis. He was continued on eliquis for a fib. He was transferred to the floor on hospital day one.  On hospital day two, he still had significant pain. He was placed on a low dose dilaudid PCA. His right leg cellulitis was improved. A wound culture was checked. He was continued on IV antibiotics.   On hospital day three, he was continued on IV antibiotics and wet to dry dressings. A pulmonary consult was called due  to his COPD.   A wound vac was placed on hospital day 4. Per wound care RN, his wound had 90% yellow slough with 10% granulation tissue. There was no odor or drainage. He continued to have doppler signals to his right posterior tibial, peroneal and dorsalis pedis.   The patient felt ready to go home on hospital day 5. Arrangements were made for home negative pressure wound therapy. His creatinine remained stable. His pulmonary function remained at baseline. He was discharged home with Augmentin for two weeks. He will follow up in 2 weeks with Dr. Trula Slade.       CBC    Component Value Date/Time   WBC 6.3 09/11/2014 0439   RBC 3.73* 09/11/2014 0439   HGB 10.4* 09/11/2014 0439   HCT 33.7* 09/11/2014 0439   PLT 243 09/11/2014 0439   MCV 90.3 09/11/2014 0439   MCH 27.9 09/11/2014 0439   MCHC 30.9 09/11/2014 0439   RDW 16.5* 09/11/2014 0439   LYMPHSABS 0.5* 09/06/2014 1408   MONOABS 1.0 09/06/2014 1408   EOSABS 0.1 09/06/2014 1408   BASOSABS 0.0 09/06/2014 1408    BMET    Component Value Date/Time   NA 142 09/11/2014 0439   K 3.6 09/11/2014 0439   CL 103 09/11/2014 0439   CO2 31 09/11/2014 0439   GLUCOSE 106* 09/11/2014 0439   BUN 18 09/11/2014 0439   CREATININE 1.54* 09/11/2014 0439   CREATININE 1.19 08/27/2014 0958   CALCIUM 8.6 09/11/2014 0439  GFRNONAA 44* 09/11/2014 0439   GFRAA 51* 09/11/2014 0439     Discharge Instructions:   The patient is discharged to home with extensive instructions on wound care and progressive ambulation.  They are instructed not to drive or perform any heavy lifting until returning to see the physician in his office.  Discharge Instructions    Call MD for:  redness, tenderness, or signs of infection (pain, swelling, bleeding, redness, odor or green/yellow discharge around incision site)    Complete by:  As directed      Call MD for:  severe or increased pain, loss or decreased feeling  in affected limb(s)    Complete by:  As directed       Call MD for:  temperature >100.5    Complete by:  As directed      Discharge patient    Complete by:  As directed   Discharge pt to home     Discharge wound care:    Complete by:  As directed   Wound vac dressing change every Tuesday, Thursday, Saturday     Driving Restrictions    Complete by:  As directed   No driving for 2 weeks     Increase activity slowly    Complete by:  As directed   Walk with assistance use walker or cane as needed     Lifting restrictions    Complete by:  As directed   No lifting for 2 weeks     Resume previous diet    Complete by:  As directed            Discharge Diagnosis:  Cellulitis of right lower extremity [L03.115] Sepsis, due to unspecified organism [A41.9]  Secondary Diagnosis: Patient Active Problem List   Diagnosis Date Noted  . Acute on chronic systolic and diastolic heart failure, NYHA class 4 08/19/2014  . Cellulitis 08/09/2014  . Atherosclerosis of native arteries of extremity with intermittent claudication 07/26/2014  . Atheroscler nonbiologic bypass graft extremity w/intermit claudication 06/26/2014  . HTN (hypertension) 06/18/2014  . Lung nodule 06/10/2014  . Preoperative respiratory examination 06/10/2014  . Chest pain 05/16/2014  . Chronic anticoagulation 05/16/2014  . Other chest pain   . Acute-on-chronic kidney injury 05/15/2014  . COPD exacerbation 05/14/2014  . CAD (coronary artery disease) of artery bypass graft 05/14/2014  . Chronic kidney disease, stage 2, mildly decreased GFR 05/03/2014  . Chronic pain   . PAF (paroxysmal atrial fibrillation)   . History of renal cell cancer   . Chronic systolic CHF (congestive heart failure)   . CAD in native artery   . Pulmonary nodules 01/29/2014  . COPD, moderate 12/31/2013  . PAD (peripheral artery disease) 12/25/2013  . Atherosclerosis of native arteries of the extremities with intermittent claudication 12/10/2013  . OSA (obstructive sleep apnea) 06/13/2013  . Obesity  06/05/2012  . Adjustment disorder with anxiety 11/04/2008  . ADENOCARCINOMA, PROSTATE 09/23/2008  . SMOKER 09/23/2008  . Essential hypertension 09/23/2008   Past Medical History  Diagnosis Date  . CAD (coronary artery disease)     a. s/p CABG x 5;  b. 04/2012 Cath: patent LIMA->LAD and VG->OM3, all other grafts occluded. c. Cath 12/2013: patent SVG-OM3 & LIMA-LAD, known occ other VG.  Marland Kitchen Hypertension   . Adjustment disorder with anxiety   . Hyperlipidemia     a. Unable to take statins.  Marland Kitchen BPH (benign prostatic hypertrophy)   . COPD (chronic obstructive pulmonary disease)     a. on home O2.  Marland Kitchen  Arthritis   . Ischemic cardiomyopathy     a. 06/2013 Echo: EF 30-35%, no reg wma's, Gr2 DD, triv AI, mod dil LA, nl RV fxn.  . Chronic systolic CHF (congestive heart failure)     a. 06/2013 Echo: EF 30-35%. b. 11/08/13 EF 40-45%, LVH, HK of the mid-distalanterseptal myocardium, trivial Ao regurg, calcified mitral annulus, RA mildly dilated  . Prostate cancer dx'd 2012  . Renal cancer dx'd 1997    lt nephrectomy  . CKD (chronic kidney disease), stage III   . Tobacco abuse     a. 89 yr hx as of 2015, transitioned to e-cig.  Marland Kitchen Syncope     a. 08/2013: the day following cath, no injury, had received multiple doses of Versed for anxiety - this was felt to be a contributing factor.  Marland Kitchen PAF (paroxysmal atrial fibrillation)     a. on Eliquis 5 mg bid  . Hypotension     a. H/o soft BP prohibiting ACEI/ARB use.  . Chronic pain   . PAD (peripheral artery disease)     a. 12/2013: PV angio s/p angioplasty to R external iliac artery stenosis, also has R SFA occlusion with reconstitution of above-knee popliteal artery, 2 vessel runoff via peroneal and posterior tib.b. LE doppler 02/2014 ABI right 0.52, left 0.95  . Pulmonary nodules     a. 12/2013:  small pulmonary nodules measuring up to 50mm in RM/LL, f/u recommended 6-12 months.  . Anxiety Dx 2004  . Anginal pain     none recent  . Shortness of breath  dyspnea   . Rheumatic fever     child  . Family history of adverse reaction to anesthesia     SISTER HAD COPLICATIONS  TOOK 2 WEEKS TO WAKE UP "       Medication List    STOP taking these medications        levofloxacin 750 MG tablet  Commonly known as:  LEVAQUIN     moxifloxacin 400 MG tablet  Commonly known as:  AVELOX      TAKE these medications        albuterol (2.5 MG/3ML) 0.083% nebulizer solution  Commonly known as:  PROVENTIL  Take 3 mLs (2.5 mg total) by nebulization 4 (four) times daily. Dx J44.1     albuterol 108 (90 BASE) MCG/ACT inhaler  Commonly known as:  PROVENTIL HFA;VENTOLIN HFA  Inhale 2 puffs into the lungs every 6 (six) hours as needed for wheezing or shortness of breath. Shortness of breath     ALPRAZolam 1 MG tablet  Commonly known as:  XANAX  Take 1 tablet (1 mg total) by mouth 3 (three) times daily.     amoxicillin-clavulanate 250-62.5 MG/5ML suspension  Commonly known as:  AUGMENTIN  Take 17.5 mLs (875 mg total) by mouth 2 (two) times daily. Take two and a half teaspoons twice daily for 14 days.     apixaban 5 MG Tabs tablet  Commonly known as:  ELIQUIS  Take 1 tablet (5 mg total) by mouth 2 (two) times daily.     doxazosin 8 MG tablet  Commonly known as:  CARDURA  Take 1 tablet (8 mg total) by mouth at bedtime.     furosemide 40 MG tablet  Commonly known as:  LASIX  Take 1 tablet (40 mg total) by mouth 2 (two) times daily.     HYDROcodone-acetaminophen 10-325 MG per tablet  Commonly known as:  NORCO  Take 1 tablet by mouth 3 (three)  times daily.     HYDROmorphone 4 MG tablet  Commonly known as:  DILAUDID  Take 1 tablet (4 mg total) by mouth every 4 (four) hours as needed for moderate pain.     HYDROmorphone 4 MG tablet  Commonly known as:  DILAUDID  Take 1 tablet (4 mg total) by mouth every 4 (four) hours as needed for severe pain.     Ipratropium-Albuterol 20-100 MCG/ACT Aers respimat  Commonly known as:  COMBIVENT RESPIMAT    Inhale 1 puff into the lungs 4 (four) times daily.     mometasone-formoterol 100-5 MCG/ACT Aero  Commonly known as:  DULERA  Inhale 2 puffs into the lungs 2 (two) times daily.     nitroGLYCERIN 0.3 MG SL tablet  Commonly known as:  NITROSTAT  Place 0.3 mg under the tongue every 5 (five) minutes as needed for chest pain.     predniSONE 10 MG tablet  Commonly known as:  DELTASONE  Take 50 mg tablet today and taper down by 10 mg daily until completed        Dilaudid 4mg   #30 No Refill  Disposition: Home  Patient's condition: is Good  Follow up: 1. Dr. Trula Slade in 2 weeks   Virgina Jock, PA-C [facility] [phone] 09/12/2014  1:17 PM

## 2014-09-20 ENCOUNTER — Encounter: Payer: Self-pay | Admitting: Surgery

## 2014-09-23 ENCOUNTER — Encounter: Payer: Self-pay | Admitting: Cardiovascular Disease

## 2014-09-23 ENCOUNTER — Ambulatory Visit (INDEPENDENT_AMBULATORY_CARE_PROVIDER_SITE_OTHER): Payer: Medicare Other | Admitting: Cardiovascular Disease

## 2014-09-23 ENCOUNTER — Ambulatory Visit (INDEPENDENT_AMBULATORY_CARE_PROVIDER_SITE_OTHER): Payer: Self-pay | Admitting: Surgery

## 2014-09-23 ENCOUNTER — Encounter: Payer: Self-pay | Admitting: Surgery

## 2014-09-23 VITALS — BP 168/84 | HR 85 | Ht 68.5 in | Wt 222.0 lb

## 2014-09-23 VITALS — BP 130/64 | HR 74 | Ht 68.5 in | Wt 221.1 lb

## 2014-09-23 DIAGNOSIS — I2510 Atherosclerotic heart disease of native coronary artery without angina pectoris: Secondary | ICD-10-CM | POA: Diagnosis not present

## 2014-09-23 DIAGNOSIS — I2583 Coronary atherosclerosis due to lipid rich plaque: Principal | ICD-10-CM

## 2014-09-23 DIAGNOSIS — I70219 Atherosclerosis of native arteries of extremities with intermittent claudication, unspecified extremity: Secondary | ICD-10-CM

## 2014-09-23 NOTE — Progress Notes (Signed)
The patient is back today for a wound check. He is s/p R LE fem BK Pop BPG with PTFE for severe claudication / rest pain on 07/26/2014.  He has been in the hospital several times for cellulitis.  He has also complained of drainage and swelling.  The last time, I placed him in a wound VAC.  On examination, his leg looks the best I have seen it in a while.  The swelling is down.  The erythema is getting better.  He is to have a opening in the below knee incision.  This does not track when I probe it with a Q-tip, like it did before.  The wound is getting very superficial.  I have encouraged him to use the wound VAC for at least another 3 weeks.  I will see him back at that time and we will discuss whether or not to switch to wet-to-dry dressing changes.  I again stressed the importance of not smoking.  He was given 30 down a lot at 4 mg tablets today for pain.  Annamarie Major

## 2014-09-23 NOTE — Progress Notes (Signed)
Patient ID: Lucas [name], male   DOB: 1945-09-12, 69 y.o.   MRN: [id number] 69 y.o.  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  Still with wound vac on RLE.  No angina  Distant CABG  Living off LIMA and SVG OM that has been intervened on  Post nephrectomy for renal cell cancer baseline Cr 1.54   Takes lasix bid for edema and breathing although most of his dyspnea is from COPD  Cr tends to bump to near 2.0 with extral diuretic.  Has wound care nurse coming to house 3x / week and weighing regularly with sliding scale diuretic   STUDIES:  2D echo 08/10/14>>> EF40-45%, mod reduced systolic function, grade 2 diastolic dysfunction, mild/mod AR, severely dilated LA  Cath 04/19/14  [place] under general anesthesia   LEFT VENTRICULOGRAM: Left ventricular angiogram was done in the 30 RAO projection and revealed mild global hypokinesis with mildly decreased systolic function and an estimated ejection fraction of 45%. LVEDP was 18 mmHg.  IMPRESSIONS:  1. Moderately diseased left main coronary artery. 2. Severely diseased native mid left anterior descending artery. Patent LIMA to LAD. 3. Severe disease in the native left circumflex artery and its branches. Patent SVG to OM with mild, proximal disease in the graft. 4. Occluded native right coronary artery. Left to right collaterals fill the distal RCA. 5. Mildly decreased left ventricular systolic function. LVEDP 18 mmHg. Ejection fraction 45%.  RECOMMENDATION: Continue medical therapy. Cardiac cath was done under general anesthesia today. Given the patient's baseline anxiety, general anesthesia is necessary for this procedure. Followup  with Dr. Johnsie Cancel.     ROS: Denies fever, malais, weight loss, blurry vision, decreased visual acuity, cough, sputum, SOB, hemoptysis, pleuritic pain, palpitaitons, heartburn, abdominal pain, melena, lower extremity edema, claudication, or rash.  All other systems reviewed and negative  General: Anxious  Chroically ill overweight white male  HEENT: normal Neck supple with no adenopathy JVP normal no bruits no thyromegaly Lungs Severe COPD with exp wheezes  Heart:  S1/S2 no murmur, no rub, gallop or click PMI normal Abdomen: benighn, BS positve, no tenderness, no AAA no bruit.  No HSM or HJR Wound Vac on RLE  Plus one LE edema  Neuro non-focal No muscular weakness   Current Outpatient Prescriptions  Medication Sig Dispense Refill  . albuterol (PROVENTIL HFA;VENTOLIN HFA) 108 (90 BASE) MCG/ACT inhaler Inhale 2 puffs into the lungs every 6 (six) hours as needed for wheezing or shortness of breath. Shortness of breath (Patient taking differently: Inhale 2 puffs into the lungs every 6 (six) hours as needed for wheezing or shortness of breath. ) 1 Inhaler 5  . albuterol (PROVENTIL) (2.5 MG/3ML) 0.083% nebulizer solution Take 3 mLs (2.5 mg total) by nebulization 4 (four) times daily. Dx J44.1 360 mL 5  . ALPRAZolam (XANAX) 1 MG tablet Take 1 tablet (1 mg total) by mouth 3 (three) times daily. 90 tablet 2  . apixaban (ELIQUIS) 5 MG TABS tablet Take 1 tablet (5 mg total) by mouth 2 (two) times daily. 60 tablet 1  . doxazosin (CARDURA) 8 MG tablet Take 1 tablet (8 mg total) by mouth at bedtime. 60 tablet 0  .  furosemide (LASIX) 40 MG tablet Take 1 tablet (40 mg total) by mouth 2 (two) times daily. (Patient taking differently: Take 80 mg by mouth 2 (two) times daily. ) 60 tablet 1  . HYDROcodone-acetaminophen (NORCO) 10-325 MG per tablet Take 1 tablet by mouth 3 (three) times daily. (Patient taking differently: Take 1 tablet by mouth 5 (five) times daily. ) 15 tablet 0  . HYDROmorphone (DILAUDID)  4 MG tablet Take 1 tablet (4 mg total) by mouth every 4 (four) hours as needed for moderate pain. 90 tablet 0  . HYDROmorphone (DILAUDID) 4 MG tablet Take 1 tablet (4 mg total) by mouth every 4 (four) hours as needed for severe pain. 30 tablet 0  . Ipratropium-Albuterol (COMBIVENT RESPIMAT) 20-100 MCG/ACT AERS respimat Inhale 1 puff into the lungs 4 (four) times daily. 1 Inhaler 5  . mometasone-formoterol (DULERA) 100-5 MCG/ACT AERO Inhale 2 puffs into the lungs 2 (two) times daily. 1 Inhaler 5  . nitroGLYCERIN (NITROSTAT) 0.3 MG SL tablet Place 0.3 mg under the tongue every 5 (five) minutes as needed for chest pain.    . predniSONE (DELTASONE) 10 MG tablet Take 50 mg tablet today and taper down by 10 mg daily until completed (Patient taking differently: Take 10 mg by mouth See admin instructions. Take 1 tablet (10 mg) every morning; may take a 2nd tablet after 2pm if needed for chest pressure from fluid buildup) 15 tablet 5   No current facility-administered medications for this visit.    Allergies  Aspirin; Ativan; Lisinopril; Morphine and related; and Ibuprofen  Electrocardiogram:  afib 97 nonspecific ST/T wave changes   Assessment and Plan  1.  CAD:  Distant CABG  Last cath 10/15 patent SVG OM and LIMA with collaterals  Stable no angina continue medical Rx 2. CHF:  EF 45%  Continue bid lasix majority of dyspnea from COPD 3. COPD Gold 3 continue inhalers indicates only smoking 2 cigs/week but doubt it 4. PVD  Common fem to right popliteal bypass in January slow to heal wound vac RLE f/u Dr Luanna Cole

## 2014-09-23 NOTE — Patient Instructions (Signed)
Your physician wants you to follow-up in:  6 MONTHS WITH DR NISHAN  You will receive a reminder letter in the mail two months in advance. If you don't receive a letter, please call our office to schedule the follow-up appointment. Your physician recommends that you continue on your current medications as directed. Please refer to the Current Medication list given to you today. 

## 2014-09-30 ENCOUNTER — Telehealth: Payer: Self-pay | Admitting: Internal Medicine

## 2014-09-30 MED ORDER — PREDNISONE 10 MG PO TABS: ORAL_TABLET | ORAL | Status: DC

## 2014-09-30 NOTE — Telephone Encounter (Signed)
That is awful lot of prednisone he is asking for . Ok to increase to 10mg  at day, 10mg  at night through mid April 2016 and then d/w Tammy Parrett about taper

## 2014-09-30 NOTE — Telephone Encounter (Signed)
Called and spoke to pt. Pt requesting pred refill. Pt states he is now taking 10mg  in morning and 10mg  at night d/t recent increase in SOB--this makes him feel much better. Pt stated he normally takes 10mg  per day. Pt stated he does not want a pred taper. Appt made with TP on 10/17/14.   MR advise if this is ok for pt to take 10mg  BID when feeling worse.

## 2014-09-30 NOTE — Telephone Encounter (Signed)
Rx sent to pharmacy. Pt notified. Nothing further needed.  

## 2014-10-01 ENCOUNTER — Other Ambulatory Visit: Payer: Self-pay | Admitting: *Deleted

## 2014-10-01 DIAGNOSIS — I739 Peripheral vascular disease, unspecified: Secondary | ICD-10-CM

## 2014-10-01 MED ORDER — APIXABAN 5 MG PO TABS: 5.0000 mg | ORAL_TABLET | Freq: Two times a day (BID) | ORAL | Status: AC

## 2014-10-04 ENCOUNTER — Telehealth: Payer: Self-pay | Admitting: Cardiovascular Disease

## 2014-10-04 NOTE — Telephone Encounter (Addendum)
Patient st he is SOB, but is chronically due to his COPD. His allergies are bothering him as well. He does not sounds SOB on the phone.  Per his [facility] nurse, he has gained 4 pounds since yesterday but his ankle measurements remain consistent. Per the patient, he can tell he is swollen in his extremities.  He has taken his medications today. He takes Lasix 40 mg BID.  VS today: BP 130/90 HR 89 Per Truitt Merle, patient is to INCREASE LASIX to 60 mg BID for the weekend.  Patient agrees with treatment plan and will be calling the office Monday for follow-up.

## 2014-10-04 NOTE — Telephone Encounter (Signed)
New message      Pt has gained 4lbs since yesterday.  Pt has COPE---he is sob anyway.   Ankle measurements has been consistant. Please advise

## 2014-10-08 ENCOUNTER — Other Ambulatory Visit (HOSPITAL_COMMUNITY): Payer: Self-pay

## 2014-10-08 ENCOUNTER — Telehealth: Payer: Self-pay | Admitting: Physician Assistant

## 2014-10-08 ENCOUNTER — Observation Stay (HOSPITAL_COMMUNITY)
Admission: EM | Admit: 2014-10-08 | Discharge: 2014-10-11 | Disposition: A | Discharge status: Home / Self Care | Payer: Medicare Other | Attending: Internal Medicine | Admitting: Internal Medicine

## 2014-10-08 ENCOUNTER — Emergency Department (HOSPITAL_COMMUNITY): Payer: Medicare Other

## 2014-10-08 DIAGNOSIS — I480 Paroxysmal atrial fibrillation: Secondary | ICD-10-CM

## 2014-10-08 DIAGNOSIS — Z951 Presence of aortocoronary bypass graft: Secondary | ICD-10-CM | POA: Insufficient documentation

## 2014-10-08 DIAGNOSIS — E785 Hyperlipidemia, unspecified: Secondary | ICD-10-CM | POA: Diagnosis not present

## 2014-10-08 DIAGNOSIS — I5022 Chronic systolic (congestive) heart failure: Secondary | ICD-10-CM

## 2014-10-08 DIAGNOSIS — J441 Chronic obstructive pulmonary disease with (acute) exacerbation: Secondary | ICD-10-CM | POA: Diagnosis not present

## 2014-10-08 DIAGNOSIS — I255 Ischemic cardiomyopathy: Secondary | ICD-10-CM | POA: Insufficient documentation

## 2014-10-08 DIAGNOSIS — I10 Essential (primary) hypertension: Secondary | ICD-10-CM

## 2014-10-08 DIAGNOSIS — F4322 Adjustment disorder with anxiety: Secondary | ICD-10-CM | POA: Diagnosis not present

## 2014-10-08 DIAGNOSIS — R7303 Prediabetes: Secondary | ICD-10-CM | POA: Diagnosis present

## 2014-10-08 DIAGNOSIS — I129 Hypertensive chronic kidney disease with stage 1 through stage 4 chronic kidney disease, or unspecified chronic kidney disease: Secondary | ICD-10-CM | POA: Insufficient documentation

## 2014-10-08 DIAGNOSIS — R0602 Shortness of breath: Secondary | ICD-10-CM

## 2014-10-08 DIAGNOSIS — Z886 Allergy status to analgesic agent status: Secondary | ICD-10-CM | POA: Insufficient documentation

## 2014-10-08 DIAGNOSIS — Z8546 Personal history of malignant neoplasm of prostate: Secondary | ICD-10-CM | POA: Insufficient documentation

## 2014-10-08 DIAGNOSIS — N179 Acute kidney failure, unspecified: Secondary | ICD-10-CM | POA: Diagnosis not present

## 2014-10-08 DIAGNOSIS — G894 Chronic pain syndrome: Secondary | ICD-10-CM | POA: Insufficient documentation

## 2014-10-08 DIAGNOSIS — Z888 Allergy status to other drugs, medicaments and biological substances status: Secondary | ICD-10-CM | POA: Diagnosis not present

## 2014-10-08 DIAGNOSIS — Z885 Allergy status to narcotic agent status: Secondary | ICD-10-CM | POA: Insufficient documentation

## 2014-10-08 DIAGNOSIS — N400 Enlarged prostate without lower urinary tract symptoms: Secondary | ICD-10-CM | POA: Insufficient documentation

## 2014-10-08 DIAGNOSIS — I2510 Atherosclerotic heart disease of native coronary artery without angina pectoris: Secondary | ICD-10-CM

## 2014-10-08 DIAGNOSIS — F17210 Nicotine dependence, cigarettes, uncomplicated: Secondary | ICD-10-CM | POA: Insufficient documentation

## 2014-10-08 DIAGNOSIS — N183 Chronic kidney disease, stage 3 unspecified: Secondary | ICD-10-CM | POA: Diagnosis present

## 2014-10-08 DIAGNOSIS — E669 Obesity, unspecified: Secondary | ICD-10-CM | POA: Insufficient documentation

## 2014-10-08 DIAGNOSIS — Z9981 Dependence on supplemental oxygen: Secondary | ICD-10-CM | POA: Diagnosis not present

## 2014-10-08 DIAGNOSIS — Z6834 Body mass index (BMI) 34.0-34.9, adult: Secondary | ICD-10-CM | POA: Diagnosis not present

## 2014-10-08 DIAGNOSIS — R0600 Dyspnea, unspecified: Secondary | ICD-10-CM | POA: Diagnosis not present

## 2014-10-08 DIAGNOSIS — R0603 Acute respiratory distress: Secondary | ICD-10-CM | POA: Insufficient documentation

## 2014-10-08 DIAGNOSIS — Z720 Tobacco use: Secondary | ICD-10-CM | POA: Diagnosis present

## 2014-10-08 DIAGNOSIS — Z85528 Personal history of other malignant neoplasm of kidney: Secondary | ICD-10-CM | POA: Insufficient documentation

## 2014-10-08 DIAGNOSIS — R7309 Other abnormal glucose: Secondary | ICD-10-CM

## 2014-10-08 LAB — BASIC METABOLIC PANEL
Anion gap: 14 (ref 5–15)
BUN: 25 mg/dL — ABNORMAL HIGH (ref 6–23)
CHLORIDE: 103 mmol/L (ref 96–112)
CO2: 23 mmol/L (ref 19–32)
Calcium: 9.4 mg/dL (ref 8.4–10.5)
Creatinine, Ser: 1.15 mg/dL (ref 0.50–1.35)
GFR calc Af Amer: 73 mL/min — ABNORMAL LOW (ref >90)
GFR, EST NON AFRICAN AMERICAN: 63 mL/min — AB (ref >90)
Glucose, Bld: 114 mg/dL — ABNORMAL HIGH (ref 70–99)
Potassium: 3.5 mmol/L (ref 3.5–5.1)
Sodium: 140 mmol/L (ref 135–145)

## 2014-10-08 LAB — CBC
HCT: 39.9 % (ref 39.0–52.0)
Hemoglobin: 12.7 g/dL — ABNORMAL LOW (ref 13.0–17.0)
MCH: 27.7 pg (ref 26.0–34.0)
MCHC: 31.8 g/dL (ref 30.0–36.0)
MCV: 87.1 fL (ref 78.0–100.0)
PLATELETS: 168 10*3/uL (ref 150–400)
RBC: 4.58 MIL/uL (ref 4.22–5.81)
RDW: 17.1 % — AB (ref 11.5–15.5)
WBC: 10 10*3/uL (ref 4.0–10.5)

## 2014-10-08 LAB — TROPONIN I: TROPONIN I: 0.06 ng/mL — AB (ref <0.031)

## 2014-10-08 MED ORDER — FUROSEMIDE 10 MG/ML IJ SOLN
40.0000 mg | INTRAMUSCULAR | Status: AC
  Administered 2014-10-08: 40 mg via INTRAVENOUS
  Filled 2014-10-08: qty 4

## 2014-10-08 MED ORDER — ALBUTEROL (5 MG/ML) CONTINUOUS INHALATION SOLN
10.0000 mg/h | INHALATION_SOLUTION | RESPIRATORY_TRACT | Status: AC
  Administered 2014-10-08: 10 mg/h via RESPIRATORY_TRACT
  Filled 2014-10-08: qty 20

## 2014-10-08 MED ORDER — HYDROCODONE-ACETAMINOPHEN 5-325 MG PO TABS
2.0000 | ORAL_TABLET | Freq: Once | ORAL | Status: AC
  Administered 2014-10-08: 2 via ORAL
  Filled 2014-10-08: qty 2

## 2014-10-08 MED ORDER — METHYLPREDNISOLONE SODIUM SUCC 125 MG IJ SOLR
125.0000 mg | Freq: Once | INTRAMUSCULAR | Status: AC
  Administered 2014-10-08: 125 mg via INTRAVENOUS
  Filled 2014-10-08: qty 2

## 2014-10-08 MED ORDER — IPRATROPIUM BROMIDE 0.02 % IN SOLN
0.5000 mg | RESPIRATORY_TRACT | Status: AC
  Administered 2014-10-08: 0.5 mg via RESPIRATORY_TRACT

## 2014-10-08 NOTE — H&P (Signed)
[facility] Admission History and Physical       Lucas [name] ZOX:[id number] DOB: [birth year] DOA: 10/08/2014  Referring physician:  EDP PCP: Minerva Ends, MD  Specialists:   Chief Complaint: SOB and Wheezing  HPI: Lucas [name] is a 69 y.o. male with a history of COPD, Chronic Systolic CHF, HTN who presents to the ED with complaints of worsening SOB,and wheezing with chest tightness  over the past 2 days.  He denies any fevers or chills.   His O2 sats were found to be 89% by EMS on arrival, and he was placed on NCO2 at 2 liters on route.  In the ED he was treated with nebulizer treatments and IV Steroids and had mild improvement and was referred for medical admission.       Review of Systems:    Constitutional: No Weight Loss, No Weight Gain, Night Sweats, Fevers, Chills, Dizziness, Light Headedness, Fatigue, or Generalized Weakness HEENT: No Headaches, Difficulty Swallowing,Tooth/Dental Problems,Sore Throat,  No Sneezing, Rhinitis, Ear Ache, Nasal Congestion, or Post Nasal Drip,  Cardio-vascular:  No Chest pain, Orthopnea, PND, Edema in Lower Extremities, Anasarca, Dizziness, Palpitations  Resp: No Dyspnea, No DOE, +Productive Cough, No Hemoptysis, +Wheezing.    GI: No Heartburn, Indigestion, Abdominal Pain, Nausea, Vomiting, Diarrhea, Constipation, Hematemesis, Hematochezia, Melena, Change in Bowel Habits,  Loss of Appetite  GU: No Dysuria, No Change in Color of Urine, No Urgency or Urinary Frequency, No Flank pain.  Musculoskeletal: No Joint Pain or Swelling, No Decreased Range of Motion, No Back Pain.  Neurologic: No Syncope, No Seizures, Muscle Weakness, Paresthesia, Vision Disturbance or Loss, No Diplopia, No Vertigo, No Difficulty Walking,  Skin: No Rash or Lesions. Psych: No Change in Mood or Affect, No Depression or Anxiety, No Memory loss, No Confusion, or Hallucinations   Past Medical History  Diagnosis Date  . CAD (coronary artery disease)     a.  s/p CABG x 5;  b. 04/2012 Cath: patent LIMA->LAD and VG->OM3, all other grafts occluded. c. Cath 12/2013: patent SVG-OM3 & LIMA-LAD, known occ other VG.  Marland Kitchen Hypertension   . Adjustment disorder with anxiety   . Hyperlipidemia     a. Unable to take statins.  Marland Kitchen BPH (benign prostatic hypertrophy)   . COPD (chronic obstructive pulmonary disease)     a. on home O2.  . Arthritis   . Ischemic cardiomyopathy     a. 06/2013 Echo: EF 30-35%, no reg wma's, Gr2 DD, triv AI, mod dil LA, nl RV fxn.  . Chronic systolic CHF (congestive heart failure)     a. 06/2013 Echo: EF 30-35%. b. 11/08/13 EF 40-45%, LVH, HK of the mid-distalanterseptal myocardium, trivial Ao regurg, calcified mitral annulus, RA mildly dilated  . Prostate cancer dx'd 2012  . Renal cancer dx'd 1997    lt nephrectomy  . CKD (chronic kidney disease), stage III   . Tobacco abuse     a. 30 yr hx as of 2015, transitioned to e-cig.  Marland Kitchen Syncope     a. 08/2013: the day following cath, no injury, had received multiple doses of Versed for anxiety - this was felt to be a contributing factor.  Marland Kitchen PAF (paroxysmal atrial fibrillation)     a. on Eliquis 5 mg bid  . Hypotension     a. H/o soft BP prohibiting ACEI/ARB use.  . Chronic pain   . PAD (peripheral artery disease)     a. 12/2013: PV angio s/p angioplasty to R external iliac artery stenosis,  also has R SFA occlusion with reconstitution of above-knee popliteal artery, 2 vessel runoff via peroneal and posterior tib.b. LE doppler 02/2014 ABI right 0.52, left 0.95  . Pulmonary nodules     a. 12/2013:  small pulmonary nodules measuring up to 13mm in RM/LL, f/u recommended 6-12 months.  . Anxiety Dx 2004  . Anginal pain     none recent  . Shortness of breath dyspnea   . Rheumatic fever     child  . Family history of adverse reaction to anesthesia     SISTER HAD COPLICATIONS  TOOK 2 WEEKS TO WAKE UP "     Past Surgical History  Procedure Laterality Date  . Coronary artery bypass graft      x 5   . Cardiac catheterization    . Nephrectomy      left nephrectomy for ca  . Posterior cervical laminectomy      x 8   limited ROM  and can't lie flat  . Heart stents      x 5  . Robot assisted laparoscopic radical prostatectomy      for prostate cancer  . Cystoscopy with litholapaxy  05/01/2012    Procedure: CYSTOSCOPY WITH LITHOLAPAXY;  Surgeon: Dutch Gray, MD;  Location: [facility]S;  Service: Urology;  Laterality: N/A;  . Prostate cancer    . Kidney surgery Left 1994  . Left and right heart catheterization with coronary/graft angiogram N/A 04/24/2012    Procedure: LEFT AND RIGHT HEART CATHETERIZATION WITH Beatrix Fetters;  Surgeon: Sherren Mocha, MD;  Location: [facility] CATH LAB;  Service: Cardiovascular;  Laterality: N/A;  . Left and right heart catheterization with coronary angiogram N/A 08/21/2013    Procedure: LEFT AND RIGHT HEART CATHETERIZATION WITH CORONARY ANGIOGRAM;  Surgeon: Josue Hector, MD;  Location: [facility] CATH LAB;  Service: Cardiovascular;  Laterality: N/A;  . Lower extremity angiogram N/A 08/21/2013    Procedure: LOWER EXTREMITY ANGIOGRAM;  Surgeon: Josue Hector, MD;  Location: [facility] CATH LAB;  Service: Cardiovascular;  Laterality: N/A;  . Abdominal aortagram N/A 12/25/2013    Procedure: ABDOMINAL AORTAGRAM;  Surgeon: Serafina Mitchell, MD;  Location: [facility] CATH LAB;  Service: Cardiovascular;  Laterality: N/A;  . Left heart catheterization with coronary/graft angiogram N/A 04/19/2014    Procedure: LEFT HEART CATHETERIZATION WITH Beatrix Fetters;  Surgeon: Jettie Booze, MD;  Location: [facility] CATH LAB;  Service: Cardiovascular;  Laterality: N/A;  . Abdominal aortagram N/A 06/26/2014    Procedure: ABDOMINAL AORTAGRAM;  Surgeon: Serafina Mitchell, MD;  Location: [facility] CATH LAB;  Service: Cardiovascular;  Laterality: N/A;  . Femoral-popliteal bypass graft Right 07/26/2014    Procedure: BYPASS GRAFT FEMORAL-POPLITEAL ARTERY- right leg with PTFE;  Surgeon: Serafina Mitchell, MD;   Location: [place] OR;  Service: Vascular;  Laterality: Right;      Prior to Admission medications   Medication Sig Start Date End Date Taking? Authorizing Provider  albuterol (PROVENTIL HFA;VENTOLIN HFA) 108 (90 BASE) MCG/ACT inhaler Inhale 2 puffs into the lungs every 6 (six) hours as needed for wheezing or shortness of breath. Shortness of breath Patient taking differently: Inhale 2 puffs into the lungs every 6 (six) hours as needed for wheezing or shortness of breath.  08/16/14  Yes Brand Males, MD  albuterol (PROVENTIL) (2.5 MG/3ML) 0.083% nebulizer solution Take 3 mLs (2.5 mg total) by nebulization 4 (four) times daily. Dx J44.1 05/31/14  Yes Brand Males, MD  ALPRAZolam Duanne Moron) 1 MG tablet Take 1 tablet (1 mg total) by  mouth 3 (three) times daily. 08/11/14  Yes Theodis Blaze, MD  apixaban (ELIQUIS) 5 MG TABS tablet Take 1 tablet (5 mg total) by mouth 2 (two) times daily. 10/01/14  Yes Josue Hector, MD  doxazosin (CARDURA) 8 MG tablet Take 1 tablet (8 mg total) by mouth at bedtime. 06/10/14  Yes Brand Males, MD  furosemide (LASIX) 40 MG tablet Take 1 tablet (40 mg total) by mouth 2 (two) times daily. 08/11/14  Yes Theodis Blaze, MD  HYDROcodone-acetaminophen (NORCO) 10-325 MG per tablet Take 1 tablet by mouth 3 (three) times daily. Patient taking differently: Take 1 tablet by mouth 5 (five) times daily.  06/27/14  Yes Samantha J Rhyne, PA-C  HYDROmorphone (DILAUDID) 4 MG tablet Take 1 tablet (4 mg total) by mouth every 4 (four) hours as needed for moderate pain. Patient taking differently: Take 4 mg by mouth 4 (four) times daily.  08/11/14  Yes Theodis Blaze, MD  Ipratropium-Albuterol (COMBIVENT RESPIMAT) 20-100 MCG/ACT AERS respimat Inhale 1 puff into the lungs 4 (four) times daily. 08/16/14  Yes Brand Males, MD  mometasone-formoterol (DULERA) 100-5 MCG/ACT AERO Inhale 2 puffs into the lungs 2 (two) times daily. 06/10/14  Yes Brand Males, MD  predniSONE (DELTASONE) 10 MG tablet  Take one tablet in the morning and one tablet at night. Patient taking differently: Take 10 mg by mouth daily with breakfast.  09/30/14  Yes Brand Males, MD  HYDROmorphone (DILAUDID) 4 MG tablet Take 1 tablet (4 mg total) by mouth every 4 (four) hours as needed for severe pain. Patient not taking: Reported on 10/08/2014 09/11/14   Alvia Grove, PA-C  nitroGLYCERIN (NITROSTAT) 0.3 MG SL tablet Place 0.3 mg under the tongue every 5 (five) minutes as needed for chest pain.    Historical Provider, MD  predniSONE (DELTASONE) 10 MG tablet Take 50 mg tablet today and taper down by 10 mg daily until completed Patient not taking: Reported on 10/08/2014 08/11/14   Theodis Blaze, MD     Allergies  Allergen Reactions  . Aspirin Other (See Comments)    Causes nose bleeds  . Ativan [Lorazepam] Other (See Comments)    Increases agitation, tolerates xanax  . Lisinopril Cough  . Morphine And Related Other (See Comments)    Hallucinations, too sedated when given with ativan  . Ibuprofen Hives, Nausea And Vomiting and Rash    Social History:  reports that he has been smoking Cigarettes.  He has a 2.7 pack-year smoking history. He quit smokeless tobacco use about 2 years ago. He reports that he drinks alcohol. He reports that he does not use illicit drugs.    Family History  Problem Relation Age of Onset  . Diabetes Mother   . Heart disease Mother   . Hypertension Mother   . Heart attack Mother   . Coronary artery disease    . Heart disease Father   . Diabetes Father   . Heart attack Father   . Heart disease Brother   . Heart attack Brother   . COPD Sister        Physical Exam:  GEN:  Pleasant Morbidly Obese 69 y.o. Caucasian male examined and in no acute distress; cooperative with exam Filed Vitals:   10/08/14 1931 10/08/14 1952 10/08/14 2035 10/08/14 2200  BP:  153/100  139/74  Pulse:  82  86  Temp:  97.5 F (36.4 C)    TempSrc:  Oral    Resp:  24  18  SpO2: 97% 99% 98% 95%    Blood pressure 139/74, pulse 86, temperature 97.5 F (36.4 C), temperature source Oral, resp. rate 18, SpO2 95 %. PSYCH: She is alert and oriented x4; does not appear anxious does not appear depressed; affect is normal HEENT: Normocephalic and Atraumatic, Mucous membranes pink; PERRLA; EOM intact; Fundi:  Benign;  No scleral icterus, Nares: Patent, Oropharynx: Clear, Fair Dentition,    Neck:  FROM, No Cervical Lymphadenopathy nor Thyromegaly or Carotid Bruit; No JVD; Breasts:: Not examined CHEST WALL: No tenderness CHEST: Normal respiration, clear to auscultation bilaterally HEART: Regular rate and rhythm; no murmurs rubs or gallops BACK: No kyphosis or scoliosis; No CVA tenderness ABDOMEN: Positive Bowel Sounds, Obese, Soft Non-Tender, No Rebound or Guarding; No Masses, No Organomegaly. Rectal Exam: Not done EXTREMITIES: No Cyanosis, Clubbing, or Edema; No Ulcerations. Genitalia: not examined PULSES: 2+ and symmetric SKIN: Normal hydration no rash or ulceration CNS:  Alert and Oriented x 4, No Focal Deficits  Vascular: pulses palpable throughout    Labs on Admission:  Basic Metabolic Panel:  Recent Labs Lab 10/08/14 2000  NA 140  K 3.5  CL 103  CO2 23  GLUCOSE 114*  BUN 25*  CREATININE 1.15  CALCIUM 9.4   Liver Function Tests: No results for input(s): AST, ALT, ALKPHOS, BILITOT, PROT, ALBUMIN in the last 168 hours. No results for input(s): LIPASE, AMYLASE in the last 168 hours. No results for input(s): AMMONIA in the last 168 hours. CBC:  Recent Labs Lab 10/08/14 2000  WBC 10.0  HGB 12.7*  HCT 39.9  MCV 87.1  PLT 168   Cardiac Enzymes:  Recent Labs Lab 10/08/14 2000  TROPONINI 0.06*    BNP (last 3 results)  Recent Labs  08/09/14 1055 08/19/14 1408  BNP 375.4* 476.3*    ProBNP (last 3 results)  Recent Labs  03/20/14 0906 04/17/14 0049 05/13/14 2140  PROBNP 185.0* 1368.0* 697.9*    CBG: No results for input(s): GLUCAP in the last 168  hours.  Radiological Exams on Admission: Dg Chest 2 View  10/08/2014   CLINICAL DATA:  69 year old male with shortness of breath and mid chest pain with dry cough for the past 2 days.  EXAM: CHEST  2 VIEW  COMPARISON:  Chest x-ray 09/09/2014.  FINDINGS: Lung volumes are low. No consolidative airspace disease. No pleural effusions. Linear opacities in lung bases bilaterally, similar to prior examinations, favored to reflect areas of mild chronic scarring. No evidence of pulmonary edema. Heart size is mildly enlarged (unchanged). Upper mediastinal contours are within normal limits. Atherosclerosis in the thoracic aorta. Status post median sternotomy for CABG, including LIMA.  IMPRESSION: 1. No radiographic evidence of acute cardiopulmonary disease. 2. Mild cardiomegaly. 3. Atherosclerosis.   Electronically Signed   By: Vinnie Langton M.D.   On: 10/08/2014 20:39     EKG: Independently reviewed.    Assessment/Plan:   69 y.o. male with  Principal Problem:   1.     COPD exacerbation   IV Steroid   DuoNebs   O2   Monitor O2 sats   Active Problems:   2.    Chronic systolic CHF (congestive heart failure)   Continue Lasix Rx daily     3.    PAF (paroxysmal atrial fibrillation)   Monitor on Telemetry   Continue Eliquis Rx     4.    Essential hypertension   Continue Lasix, and Cardura Rx   PRN IV Hydralizine     5.  Borderline diabetes   Check HbA1c level     6.    CKD (chronic kidney disease), stage III   Monitor BUN/Cr levels     7.    CAD in native artery   Hx        8.      Hyperlipidemia        9.  DVT Prophylaxis      On Eliquis      Code Status:     FULL CODE       Family Communication:   No Family Present    Disposition Plan:    Inpatient  Status        Time spent:  [facility] Pager [phone]   If [facility] Please Contact the Day Rounding Team MD for [facility]  If 7PM-7AM, Please Contact  Night-Floor Coverage  [email or web address] Password [email or web address] 10/08/2014, 11:49 PM     ADDENDUM:   Patient was seen and examined on 10/08/2014

## 2014-10-08 NOTE — Telephone Encounter (Signed)
Louretta Shorten, [facility] nurse, called back. Stated she did see patient yesterday, 3/28, and will see him again tomorrow, 3/30. She stated that patient had 2 lb weight loss from extra weekend Lasix, but otherwise everything else was the same, including his symptoms. She states he is still smoking heavily and engaging at times at food that is high in salt. She will call us tomorrow after her visit to give [place] updated assessment, weight, and VS. Will route to Triage so they can make note of her call tomorrow and follow up.

## 2014-10-08 NOTE — Telephone Encounter (Signed)
Received a call from patient regarding weight gain and shortness of breath. He states the shortness of breath and weight gain has been going on for about a week. He took extra Lasix over the weekend as directed, and states he has not smoked for 5 days.   He is trying to avoid high salt foods. He states his weight went down 2 pounds after the extra Lasix on the weekend but is now going back up. He says he has shortness of breath at rest, orthopnea and PND. He is also coughing.  Advised the patient that he probably needed to be evaluated and possibly admitted. He stated he could not drive but would call EMS. I agree that was the best plan.  Rosaria Ferries, PA-C 10/08/2014 6:49 PM Beeper [phone]

## 2014-10-08 NOTE — ED Provider Notes (Signed)
CSN: [id number]     Arrival date & time 10/08/14  1930 History   First MD Initiated Contact with Patient 10/08/14 1932     Chief Complaint  Patient presents with  . Shortness of Breath  . Chest Pain     (Consider location/radiation/quality/duration/timing/severity/associated sxs/prior Treatment) HPI Comments: The patient is a 69 year old male, he has a history of COPD, this is fairly severe, he is followed by Dr. Chase Caller of the pulmonary care's service. He states that he has had approximately 2 days of progressive shortness of breath which is associated with a cough productive of clear phlegm, chest tightness, increasing shortness of breath. He has been wheezing, using albuterol at home with minimal improvement, oxygen was 89% prior to paramedic arrival, he has been using albuterol with mild improvement, they gave him increase supple metal oxygen about his normal 2 L and he arose to 98%. He denies swelling of the legs, he does endorse having prior right leg bypass surgery and has a vacuum dressing on that leg, he states it is gradually improving. There is no fevers, no back pain, no dysuria or diarrhea.  The history is provided by the patient.    Past Medical History  Diagnosis Date  . CAD (coronary artery disease)     a. s/p CABG x 5;  b. 04/2012 Cath: patent LIMA->LAD and VG->OM3, all other grafts occluded. c. Cath 12/2013: patent SVG-OM3 & LIMA-LAD, known occ other VG.  Marland Kitchen Hypertension   . Adjustment disorder with anxiety   . Hyperlipidemia     a. Unable to take statins.  Marland Kitchen BPH (benign prostatic hypertrophy)   . COPD (chronic obstructive pulmonary disease)     a. on home O2.  . Arthritis   . Ischemic cardiomyopathy     a. 06/2013 Echo: EF 30-35%, no reg wma's, Gr2 DD, triv AI, mod dil LA, nl RV fxn.  . Chronic systolic CHF (congestive heart failure)     a. 06/2013 Echo: EF 30-35%. b. 11/08/13 EF 40-45%, LVH, HK of the mid-distalanterseptal myocardium, trivial Ao regurg, calcified  mitral annulus, RA mildly dilated  . Prostate cancer dx'd 2012  . Renal cancer dx'd 1997    lt nephrectomy  . CKD (chronic kidney disease), stage III   . Tobacco abuse     a. 68 yr hx as of 2015, transitioned to e-cig.  Marland Kitchen Syncope     a. 08/2013: the day following cath, no injury, had received multiple doses of Versed for anxiety - this was felt to be a contributing factor.  Marland Kitchen PAF (paroxysmal atrial fibrillation)     a. on Eliquis 5 mg bid  . Hypotension     a. H/o soft BP prohibiting ACEI/ARB use.  . Chronic pain   . PAD (peripheral artery disease)     a. 12/2013: PV angio s/p angioplasty to R external iliac artery stenosis, also has R SFA occlusion with reconstitution of above-knee popliteal artery, 2 vessel runoff via peroneal and posterior tib.b. LE doppler 02/2014 ABI right 0.52, left 0.95  . Pulmonary nodules     a. 12/2013:  small pulmonary nodules measuring up to 77mm in RM/LL, f/u recommended 6-12 months.  . Anxiety Dx 2004  . Anginal pain     none recent  . Shortness of breath dyspnea   . Rheumatic fever     child  . Family history of adverse reaction to anesthesia     SISTER HAD COPLICATIONS  TOOK 2 WEEKS TO WAKE UP "  Past Surgical History  Procedure Laterality Date  . Coronary artery bypass graft      x 5  . Cardiac catheterization    . Nephrectomy      left nephrectomy for ca  . Posterior cervical laminectomy      x 8   limited ROM  and can't lie flat  . Heart stents      x 5  . Robot assisted laparoscopic radical prostatectomy      for prostate cancer  . Cystoscopy with litholapaxy  05/01/2012    Procedure: CYSTOSCOPY WITH LITHOLAPAXY;  Surgeon: Dutch Gray, MD;  Location: [facility];  Service: Urology;  Laterality: N/A;  . Prostate cancer    . Kidney surgery Left 1994  . Left and right heart catheterization with coronary/graft angiogram N/A 04/24/2012    Procedure: LEFT AND RIGHT HEART CATHETERIZATION WITH Beatrix Fetters;  Surgeon: Sherren Mocha, MD;   Location: [facility] CATH LAB;  Service: Cardiovascular;  Laterality: N/A;  . Left and right heart catheterization with coronary angiogram N/A 08/21/2013    Procedure: LEFT AND RIGHT HEART CATHETERIZATION WITH CORONARY ANGIOGRAM;  Surgeon: Josue Hector, MD;  Location: [facility] CATH LAB;  Service: Cardiovascular;  Laterality: N/A;  . Lower extremity angiogram N/A 08/21/2013    Procedure: LOWER EXTREMITY ANGIOGRAM;  Surgeon: Josue Hector, MD;  Location: [facility] CATH LAB;  Service: Cardiovascular;  Laterality: N/A;  . Abdominal aortagram N/A 12/25/2013    Procedure: ABDOMINAL AORTAGRAM;  Surgeon: Serafina Mitchell, MD;  Location: [facility] CATH LAB;  Service: Cardiovascular;  Laterality: N/A;  . Left heart catheterization with coronary/graft angiogram N/A 04/19/2014    Procedure: LEFT HEART CATHETERIZATION WITH Beatrix Fetters;  Surgeon: Jettie Booze, MD;  Location: [facility] CATH LAB;  Service: Cardiovascular;  Laterality: N/A;  . Abdominal aortagram N/A 06/26/2014    Procedure: ABDOMINAL AORTAGRAM;  Surgeon: Serafina Mitchell, MD;  Location: [facility] CATH LAB;  Service: Cardiovascular;  Laterality: N/A;  . Femoral-popliteal bypass graft Right 07/26/2014    Procedure: BYPASS GRAFT FEMORAL-POPLITEAL ARTERY- right leg with PTFE;  Surgeon: Serafina Mitchell, MD;  Location: [place] OR;  Service: Vascular;  Laterality: Right;   Family History  Problem Relation Age of Onset  . Diabetes Mother   . Heart disease Mother   . Hypertension Mother   . Heart attack Mother   . Coronary artery disease    . Heart disease Father   . Diabetes Father   . Heart attack Father   . Heart disease Brother   . Heart attack Brother   . COPD Sister    History  Substance Use Topics  . Smoking status: Current Every Day Smoker -- 0.05 packs/day for 54 years    Types: Cigarettes    Last Attempt to Quit: 08/14/2014  . Smokeless tobacco: Former Systems developer    Quit date: 04/19/2012     Comment: Using e-cig now  smokes once in a while  . Alcohol Use: 0.0  oz/week    0 Standard drinks or equivalent per week     Comment: vodka cranberry occ    Review of Systems  All other systems reviewed and are negative.     Allergies  Aspirin; Ativan; Lisinopril; Morphine and related; and Ibuprofen  Home Medications   Prior to Admission medications   Medication Sig Start Date End Date Taking? Authorizing Provider  albuterol (PROVENTIL HFA;VENTOLIN HFA) 108 (90 BASE) MCG/ACT inhaler Inhale 2 puffs into the lungs every 6 (six) hours as needed for wheezing or  shortness of breath. Shortness of breath Patient taking differently: Inhale 2 puffs into the lungs every 6 (six) hours as needed for wheezing or shortness of breath.  08/16/14  Yes Brand Males, MD  albuterol (PROVENTIL) (2.5 MG/3ML) 0.083% nebulizer solution Take 3 mLs (2.5 mg total) by nebulization 4 (four) times daily. Dx J44.1 05/31/14  Yes Brand Males, MD  ALPRAZolam Duanne Moron) 1 MG tablet Take 1 tablet (1 mg total) by mouth 3 (three) times daily. 08/11/14  Yes Theodis Blaze, MD  apixaban (ELIQUIS) 5 MG TABS tablet Take 1 tablet (5 mg total) by mouth 2 (two) times daily. 10/01/14  Yes Josue Hector, MD  doxazosin (CARDURA) 8 MG tablet Take 1 tablet (8 mg total) by mouth at bedtime. 06/10/14  Yes Brand Males, MD  furosemide (LASIX) 40 MG tablet Take 1 tablet (40 mg total) by mouth 2 (two) times daily. 08/11/14  Yes Theodis Blaze, MD  HYDROcodone-acetaminophen (NORCO) 10-325 MG per tablet Take 1 tablet by mouth 3 (three) times daily. Patient taking differently: Take 1 tablet by mouth 5 (five) times daily.  06/27/14  Yes Samantha J Rhyne, PA-C  HYDROmorphone (DILAUDID) 4 MG tablet Take 1 tablet (4 mg total) by mouth every 4 (four) hours as needed for moderate pain. Patient taking differently: Take 4 mg by mouth 4 (four) times daily.  08/11/14  Yes Theodis Blaze, MD  Ipratropium-Albuterol (COMBIVENT RESPIMAT) 20-100 MCG/ACT AERS respimat Inhale 1 puff into the lungs 4 (four) times daily. 08/16/14   Yes Brand Males, MD  mometasone-formoterol (DULERA) 100-5 MCG/ACT AERO Inhale 2 puffs into the lungs 2 (two) times daily. 06/10/14  Yes Brand Males, MD  predniSONE (DELTASONE) 10 MG tablet Take one tablet in the morning and one tablet at night. Patient taking differently: Take 10 mg by mouth daily with breakfast.  09/30/14  Yes Brand Males, MD  HYDROmorphone (DILAUDID) 4 MG tablet Take 1 tablet (4 mg total) by mouth every 4 (four) hours as needed for severe pain. Patient not taking: Reported on 10/08/2014 09/11/14   Alvia Grove, PA-C  nitroGLYCERIN (NITROSTAT) 0.3 MG SL tablet Place 0.3 mg under the tongue every 5 (five) minutes as needed for chest pain.    Historical Provider, MD  predniSONE (DELTASONE) 10 MG tablet Take 50 mg tablet today and taper down by 10 mg daily until completed Patient not taking: Reported on 10/08/2014 08/11/14   Theodis Blaze, MD   BP 139/74 mmHg  Pulse 86  Temp(Src) 97.5 F (36.4 C) (Oral)  Resp 18  SpO2 95% Physical Exam  Constitutional: He appears well-developed and well-nourished. No distress.  HENT:  Head: Normocephalic and atraumatic.  Mouth/Throat: Oropharynx is clear and moist. No oropharyngeal exudate.  Eyes: Conjunctivae and EOM are normal. Pupils are equal, round, and reactive to light. Right eye exhibits no discharge. Left eye exhibits no discharge. No scleral icterus.  Neck: Normal range of motion. Neck supple. No JVD present. No thyromegaly present.  Cardiovascular: Normal rate, regular rhythm, normal heart sounds and intact distal pulses.  Exam reveals no gallop and no friction rub.   No murmur heard. Pulmonary/Chest: He is in respiratory distress. He has wheezes. He has no rales.  Increased WOB, diffuse wheezing  Abdominal: Soft. Bowel sounds are normal. He exhibits no distension and no mass. There is no tenderness.  Musculoskeletal: Normal range of motion. He exhibits tenderness. He exhibits no edema.  RLE with vacuum dressing  applied - well healed surgical areas.  Minimal edema  Lymphadenopathy:    He has no cervical adenopathy.  Neurological: He is alert. Coordination normal.  Skin: Skin is warm and dry. No rash noted. No erythema.  Psychiatric: He has a normal mood and affect. His behavior is normal.  Nursing note and vitals reviewed.   ED Course  Procedures (including critical care time) Labs Review Labs Reviewed  CBC - Abnormal; Notable for the following:    Hemoglobin 12.7 (*)    RDW 17.1 (*)    All other components within normal limits  BASIC METABOLIC PANEL - Abnormal; Notable for the following:    Glucose, Bld 114 (*)    BUN 25 (*)    GFR calc non Af Amer 63 (*)    GFR calc Af Amer 73 (*)    All other components within normal limits  TROPONIN I - Abnormal; Notable for the following:    Troponin I 0.06 (*)    All other components within normal limits    Imaging Review Dg Chest 2 View  10/08/2014   CLINICAL DATA:  69 year old male with shortness of breath and mid chest pain with dry cough for the past 2 days.  EXAM: CHEST  2 VIEW  COMPARISON:  Chest x-ray 09/09/2014.  FINDINGS: Lung volumes are low. No consolidative airspace disease. No pleural effusions. Linear opacities in lung bases bilaterally, similar to prior examinations, favored to reflect areas of mild chronic scarring. No evidence of pulmonary edema. Heart size is mildly enlarged (unchanged). Upper mediastinal contours are within normal limits. Atherosclerosis in the thoracic aorta. Status post median sternotomy for CABG, including LIMA.  IMPRESSION: 1. No radiographic evidence of acute cardiopulmonary disease. 2. Mild cardiomegaly. 3. Atherosclerosis.   Electronically Signed   By: Vinnie Langton M.D.   On: 10/08/2014 20:39    ED ECG REPORT  I personally interpreted this EKG   Date: 10/08/2014   Rate: 91  Rhythm: normal sinus rhythm  QRS Axis: normal  Intervals: normal  ST/T Wave abnormalities: anterior Q waves  Conduction  Disutrbances:none  Narrative Interpretation:   Old EKG Reviewed: unchanged   MDM   Final diagnoses:  SOB (shortness of breath)  Respiratory distress  COPD exacerbation    Respiratory distress likely secondary to COPD. X-ray, labs, albuterol continuous treatment and Solu-Medrol. Patient is in agreement.  The patient was given continuous nebulizer therapy, albuterol, Atrovent, Solu-Medrol, chest x-ray, labs, no acute findings on the chest x-ray to suggest pneumonia however the patient is persistently dyspneic, tachypneic and hypoxic requiring significant oxygen supplementation.  The patient was reevaluated several times, he will need to be admitted to the hospital for ongoing treatment of his severe COPD. Critical care provided for respiratory distress  Discussed with Dr. Arnoldo Morale who will admit.  CRITICAL CARE Performed by: Johnna Acosta Total critical care time: 35 Critical care time was exclusive of separately billable procedures and treating other patients. Critical care was necessary to treat or prevent imminent or life-threatening deterioration. Critical care was time spent personally by me on the following activities: development of treatment plan with patient and/or surrogate as well as nursing, discussions with consultants, evaluation of patient's response to treatment, examination of patient, obtaining history from patient or surrogate, ordering and performing treatments and interventions, ordering and review of laboratory studies, ordering and review of radiographic studies, pulse oximetry and re-evaluation of patient's condition.   Noemi Chapel, MD 10/08/14 [phone]

## 2014-10-08 NOTE — ED Notes (Signed)
Pt brought in c/o sob with exertion that started a couple of days ago with a productive cough.Pt on RA with sats between 91-92%, placed on 4L with sats at 97% per EMS.Pt had given hisself a breathing tx prior to EMS arrival. BP 170/90, p-100, R-20 per EMS. Wound vac to right leg.

## 2014-10-08 NOTE — ED Notes (Signed)
Bed: WA07 Expected date: 10/08/14 Expected time: 7:14 PM Means of arrival: Ambulance Comments: Short of breath, COPD, asthma

## 2014-10-08 NOTE — Telephone Encounter (Signed)
This encounter was created in error - please disregard.

## 2014-10-08 NOTE — ED Notes (Signed)
Ambulated pt about 20 feet on 2L of oxygen. O2 stats stayed above 94%.  Pt stated that his chest hurt during ambulation and immediately after.

## 2014-10-08 NOTE — Telephone Encounter (Signed)
Called patient to check on his status since he did not call in yesterday.  He states he is basically the same as last week. He remains SOB and says he has only lost 2 lbs with the extra Lasix he took over the weekend. States his BP taken by the [facility] nurse over the past few days was 137/85 range. Says he is using his inhaler more. States he is not able to do any activity.  Triage nurse attempted to call [facility] nurse to get more information about her last visit and patient assessment. LMTCB.

## 2014-10-08 NOTE — ED Notes (Signed)
Pt has wound vac to right leg from bypass a month ago.

## 2014-10-09 ENCOUNTER — Telehealth: Payer: Self-pay | Admitting: Cardiovascular Disease

## 2014-10-09 ENCOUNTER — Encounter (HOSPITAL_COMMUNITY): Payer: Self-pay | Admitting: *Deleted

## 2014-10-09 DIAGNOSIS — J441 Chronic obstructive pulmonary disease with (acute) exacerbation: Secondary | ICD-10-CM | POA: Diagnosis not present

## 2014-10-09 DIAGNOSIS — I5022 Chronic systolic (congestive) heart failure: Secondary | ICD-10-CM | POA: Diagnosis not present

## 2014-10-09 DIAGNOSIS — I10 Essential (primary) hypertension: Secondary | ICD-10-CM | POA: Diagnosis not present

## 2014-10-09 DIAGNOSIS — R0602 Shortness of breath: Secondary | ICD-10-CM | POA: Insufficient documentation

## 2014-10-09 DIAGNOSIS — R0603 Acute respiratory distress: Secondary | ICD-10-CM | POA: Insufficient documentation

## 2014-10-09 LAB — BASIC METABOLIC PANEL
Anion gap: 13 (ref 5–15)
BUN: 30 mg/dL — ABNORMAL HIGH (ref 6–23)
CO2: 23 mmol/L (ref 19–32)
Calcium: 9 mg/dL (ref 8.4–10.5)
Chloride: 102 mmol/L (ref 96–112)
Creatinine, Ser: 1.4 mg/dL — ABNORMAL HIGH (ref 0.50–1.35)
GFR calc Af Amer: 58 mL/min — ABNORMAL LOW (ref >90)
GFR calc non Af Amer: 50 mL/min — ABNORMAL LOW (ref >90)
GLUCOSE: 226 mg/dL — AB (ref 70–99)
Potassium: 3.8 mmol/L (ref 3.5–5.1)
SODIUM: 138 mmol/L (ref 135–145)

## 2014-10-09 LAB — CBC
HCT: 40.8 % (ref 39.0–52.0)
HEMOGLOBIN: 12.6 g/dL — AB (ref 13.0–17.0)
MCH: 27.3 pg (ref 26.0–34.0)
MCHC: 30.9 g/dL (ref 30.0–36.0)
MCV: 88.3 fL (ref 78.0–100.0)
PLATELETS: 149 10*3/uL — AB (ref 150–400)
RBC: 4.62 MIL/uL (ref 4.22–5.81)
RDW: 17.1 % — ABNORMAL HIGH (ref 11.5–15.5)
WBC: 7.9 10*3/uL (ref 4.0–10.5)

## 2014-10-09 LAB — GLUCOSE, CAPILLARY
Glucose-Capillary: 130 mg/dL — ABNORMAL HIGH (ref 70–99)
Glucose-Capillary: 167 mg/dL — ABNORMAL HIGH (ref 70–99)

## 2014-10-09 MED ORDER — INSULIN ASPART 100 UNIT/ML ~~LOC~~ SOLN: 0.0000 [IU] | Freq: Every day | SUBCUTANEOUS | Status: DC

## 2014-10-09 MED ORDER — FUROSEMIDE 40 MG PO TABS
60.0000 mg | ORAL_TABLET | Freq: Two times a day (BID) | ORAL | Status: DC
Start: 2014-10-09 — End: 2014-10-10
  Administered 2014-10-09 – 2014-10-10 (×2): 60 mg via ORAL
  Filled 2014-10-09 (×4): qty 1

## 2014-10-09 MED ORDER — APIXABAN 2.5 MG PO TABS
5.0000 mg | ORAL_TABLET | Freq: Two times a day (BID) | ORAL | Status: DC
  Administered 2014-10-09 – 2014-10-11 (×6): 5 mg via ORAL
  Filled 2014-10-09 (×6): qty 2
  Filled 2014-10-09: qty 1

## 2014-10-09 MED ORDER — DOXAZOSIN MESYLATE 4 MG PO TABS
8.0000 mg | ORAL_TABLET | Freq: Every day | ORAL | Status: DC
  Administered 2014-10-09 – 2014-10-10 (×3): 8 mg via ORAL
  Filled 2014-10-09: qty 2
  Filled 2014-10-09: qty 1
  Filled 2014-10-09 (×2): qty 2

## 2014-10-09 MED ORDER — HYDROMORPHONE HCL 4 MG PO TABS: 4.0000 mg | ORAL_TABLET | Freq: Four times a day (QID) | ORAL | Status: DC

## 2014-10-09 MED ORDER — NICOTINE 21 MG/24HR TD PT24
21.0000 mg | MEDICATED_PATCH | Freq: Every day | TRANSDERMAL | Status: DC
  Administered 2014-10-09 – 2014-10-10 (×2): 21 mg via TRANSDERMAL
  Filled 2014-10-09 (×2): qty 1

## 2014-10-09 MED ORDER — ACETAMINOPHEN 325 MG PO TABS
650.0000 mg | ORAL_TABLET | Freq: Four times a day (QID) | ORAL | Status: DC | PRN
Start: 2014-10-09 — End: 2014-10-09

## 2014-10-09 MED ORDER — ONDANSETRON HCL 4 MG/2ML IJ SOLN: 4.0000 mg | Freq: Three times a day (TID) | INTRAMUSCULAR | Status: AC | PRN

## 2014-10-09 MED ORDER — HYDROCODONE-ACETAMINOPHEN 10-325 MG PO TABS
1.0000 | ORAL_TABLET | Freq: Every day | ORAL | Status: DC
  Administered 2014-10-09 – 2014-10-11 (×10): 1 via ORAL
  Filled 2014-10-09 (×10): qty 1

## 2014-10-09 MED ORDER — ONDANSETRON HCL 4 MG PO TABS
4.0000 mg | ORAL_TABLET | Freq: Four times a day (QID) | ORAL | Status: DC | PRN
  Administered 2014-10-09: 4 mg via ORAL
  Filled 2014-10-09: qty 1

## 2014-10-09 MED ORDER — IPRATROPIUM-ALBUTEROL 0.5-2.5 (3) MG/3ML IN SOLN
3.0000 mL | RESPIRATORY_TRACT | Status: DC
  Administered 2014-10-09 (×2): 3 mL via RESPIRATORY_TRACT
  Filled 2014-10-09 (×2): qty 3

## 2014-10-09 MED ORDER — ALBUTEROL SULFATE (2.5 MG/3ML) 0.083% IN NEBU
2.5000 mg | INHALATION_SOLUTION | RESPIRATORY_TRACT | Status: AC
  Administered 2014-10-09 (×3): 2.5 mg via RESPIRATORY_TRACT
  Filled 2014-10-09 (×3): qty 3

## 2014-10-09 MED ORDER — SODIUM CHLORIDE 0.9 % IV SOLN: 250.0000 mL | INTRAVENOUS | Status: DC | PRN

## 2014-10-09 MED ORDER — HYDROMORPHONE HCL 4 MG PO TABS
4.0000 mg | ORAL_TABLET | Freq: Four times a day (QID) | ORAL | Status: DC
  Administered 2014-10-09 (×2): 4 mg via ORAL
  Filled 2014-10-09 (×2): qty 1

## 2014-10-09 MED ORDER — PREDNISONE 20 MG PO TABS
40.0000 mg | ORAL_TABLET | Freq: Every day | ORAL | Status: DC
  Administered 2014-10-10 – 2014-10-11 (×2): 40 mg via ORAL
  Filled 2014-10-09 (×2): qty 2

## 2014-10-09 MED ORDER — METHYLPREDNISOLONE SODIUM SUCC 125 MG IJ SOLR
125.0000 mg | Freq: Once | INTRAMUSCULAR | Status: DC
  Filled 2014-10-09: qty 2

## 2014-10-09 MED ORDER — IPRATROPIUM-ALBUTEROL 0.5-2.5 (3) MG/3ML IN SOLN
3.0000 mL | Freq: Four times a day (QID) | RESPIRATORY_TRACT | Status: DC
  Administered 2014-10-10 – 2014-10-11 (×5): 3 mL via RESPIRATORY_TRACT
  Filled 2014-10-09 (×4): qty 3

## 2014-10-09 MED ORDER — ALPRAZOLAM 1 MG PO TABS
1.0000 mg | ORAL_TABLET | Freq: Three times a day (TID) | ORAL | Status: DC
  Administered 2014-10-09 – 2014-10-11 (×8): 1 mg via ORAL
  Filled 2014-10-09 (×8): qty 1

## 2014-10-09 MED ORDER — ALPRAZOLAM 1 MG PO TABS: 1.0000 mg | ORAL_TABLET | Freq: Three times a day (TID) | ORAL | Status: DC

## 2014-10-09 MED ORDER — SODIUM CHLORIDE 0.9 % IJ SOLN
3.0000 mL | Freq: Two times a day (BID) | INTRAMUSCULAR | Status: DC
  Administered 2014-10-09 – 2014-10-11 (×6): 3 mL via INTRAVENOUS

## 2014-10-09 MED ORDER — SODIUM CHLORIDE 0.9 % IJ SOLN
3.0000 mL | Freq: Two times a day (BID) | INTRAMUSCULAR | Status: DC
  Administered 2014-10-09 – 2014-10-10 (×3): 3 mL via INTRAVENOUS

## 2014-10-09 MED ORDER — IPRATROPIUM-ALBUTEROL 0.5-2.5 (3) MG/3ML IN SOLN
3.0000 mL | RESPIRATORY_TRACT | Status: DC
  Administered 2014-10-09: 3 mL via RESPIRATORY_TRACT
  Filled 2014-10-09: qty 3

## 2014-10-09 MED ORDER — SODIUM CHLORIDE 0.9 % IJ SOLN
3.0000 mL | INTRAMUSCULAR | Status: DC | PRN
Start: 2014-10-09 — End: 2014-10-11

## 2014-10-09 MED ORDER — HYDROMORPHONE HCL 4 MG PO TABS
4.0000 mg | ORAL_TABLET | ORAL | Status: DC | PRN
  Administered 2014-10-09 – 2014-10-11 (×5): 4 mg via ORAL
  Filled 2014-10-09 (×5): qty 1

## 2014-10-09 MED ORDER — ONDANSETRON HCL 4 MG/2ML IJ SOLN: 4.0000 mg | Freq: Four times a day (QID) | INTRAMUSCULAR | Status: DC | PRN

## 2014-10-09 MED ORDER — INSULIN ASPART 100 UNIT/ML ~~LOC~~ SOLN: 0.0000 [IU] | Freq: Three times a day (TID) | SUBCUTANEOUS | Status: DC

## 2014-10-09 MED ORDER — FUROSEMIDE 40 MG PO TABS
40.0000 mg | ORAL_TABLET | Freq: Two times a day (BID) | ORAL | Status: DC
  Administered 2014-10-09: 40 mg via ORAL
  Filled 2014-10-09: qty 1

## 2014-10-09 MED ORDER — ACETAMINOPHEN 650 MG RE SUPP: 650.0000 mg | Freq: Four times a day (QID) | RECTAL | Status: DC | PRN

## 2014-10-09 NOTE — Telephone Encounter (Signed)
New message      Update Pt lost 2 lbs on Monday--wt is 219.  His baseline wt is 217.  Pt called 911 tues because of sob.  Pt was admitted to [facility] long.

## 2014-10-09 NOTE — Progress Notes (Signed)
CMT reports to me at  0523 when I called to verify admission strip for 0035 that pt had frequent pvc's at 0105 "some pairs, a triplet, and then individual ones for a couple of mins then returned to [place] PVC's".  This was during our admission pt was moving around, eating, voiding etc. CMT states she will report to me at actual time of occurrence from now on.  Will continue to monitor.  Pt is asymptomatic at this time.

## 2014-10-09 NOTE — Progress Notes (Signed)
[facility] PROGRESS NOTE  Lucas [name] DTO:[id number] DOB: 25-Mar-1946 DOA: 10/08/2014 PCP: Minerva Ends, MD  Assessment/Plan:  Principal Problem:   COPD exacerbation: still wheezing, but ambulating about the unit without difficulty. pred taper. Nebs. 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 Will change   CAD in native artery   Hypertension   Hyperlipidemia   Tobacco abuse Leg wound: change vac MWF   Chronic pain on chronic opiates. Will change to home regimen   Code Status:  full Family Communication:   Disposition Plan:  Home tomorrow if stable  Consultants:    Procedures:     Antibiotics:    HPI/Subjective: C/o pain, regimen different than at home. Cough, dyspnea, leg swelling. Needs vac changed  Objective: Filed Vitals:   10/09/14 0757  BP: 149/79  Pulse: 79  Temp: 97.9 F (36.6 C)  Resp: 18    Intake/Output Summary (Last 24 hours) at 10/09/14 1325 Last data filed at 10/09/14 1027  Gross per 24 hour  Intake   2080 ml  Output   1425 ml  Net    655 ml   Filed Weights   10/09/14 0045  Weight: 101.742 kg (224 lb 4.8 oz)    Exam:   General:  Walking around unit, chatting and joking. Appears comfortable  Cardiovascular: RRR without MGR  Respiratory: bilateral wheeze. Good air movement. No rhonchi or rales  Abdomen: s, NT, ND  Ext: right greater than left leg edema. Wound vac on right pretibial area.  Basic Metabolic Panel:  Recent Labs Lab 10/08/14 2000 10/09/14 0524  NA 140 138  K 3.5 3.8  CL 103 102  CO2 23 23  GLUCOSE 114* 226*  BUN 25* 30*  CREATININE 1.15 1.40*  CALCIUM 9.4 9.0   Liver Function Tests: No results for input(s): AST, ALT, ALKPHOS, BILITOT, PROT, ALBUMIN  in the last 168 hours. No results for input(s): LIPASE, AMYLASE in the last 168 hours. No results for input(s): AMMONIA in the last 168 hours. CBC:  Recent Labs Lab 10/08/14 2000 10/09/14 0524  WBC 10.0 7.9  HGB 12.7* 12.6*  HCT 39.9 40.8  MCV 87.1 88.3  PLT 168 149*   Cardiac Enzymes:  Recent Labs Lab 10/08/14 2000  TROPONINI 0.06*   BNP (last 3 results)  Recent Labs  08/09/14 1055 08/19/14 1408  BNP 375.4* 476.3*    ProBNP (last 3 results)  Recent Labs  03/20/14 0906 04/17/14 0049 05/13/14 2140  PROBNP 185.0* 1368.0* 697.9*    CBG: No results for input(s): GLUCAP in the last 168 hours.  No results found for this or any previous visit (from the past 240 hour(s)).   Studies: Dg Chest 2 View  10/08/2014   CLINICAL DATA:  69 year old male with shortness of breath and mid chest pain with dry cough for the past 2 days.  EXAM: CHEST  2 VIEW  COMPARISON:  Chest x-ray 09/09/2014.  FINDINGS: Lung volumes are low. No consolidative airspace disease. No pleural effusions. Linear opacities in lung bases bilaterally, similar to prior examinations, favored to reflect areas of mild chronic scarring. No evidence of pulmonary edema. Heart size is mildly enlarged (unchanged). Upper mediastinal contours are within normal limits. Atherosclerosis in the thoracic aorta. Status post median sternotomy for CABG, including  LIMA.  IMPRESSION: 1. No radiographic evidence of acute cardiopulmonary disease. 2. Mild cardiomegaly. 3. Atherosclerosis.   Electronically Signed   By: Vinnie Langton M.D.   On: 10/08/2014 20:39    Scheduled Meds: . ALPRAZolam  1 mg Oral TID  . apixaban  5 mg Oral BID  . doxazosin  8 mg Oral QHS  . furosemide  60 mg Oral BID  . HYDROcodone-acetaminophen  1 tablet Oral 5 X Daily  . ipratropium-albuterol  3 mL Nebulization Q4H  . methylPREDNISolone (SOLU-MEDROL) injection  125 mg Intravenous Once  . [START ON 10/10/2014] predniSONE  40 mg Oral Q breakfast  .  sodium chloride  3 mL Intravenous Q12H  . sodium chloride  3 mL Intravenous Q12H   Continuous Infusions:   Time spent: 25 minutes  [facility] [email or web address], password [facility] 10/09/2014, 1:25 PM  LOS: 1 day

## 2014-10-09 NOTE — Progress Notes (Signed)
Patient requests he not be bothered in the middle of the night for RT to give inhaled medications. He states he only uses his nebulizer at home in the night if he "wakes up short of breath and needs it". He has agreed to call RT if he should awaken and feel SOB. RT treatment protocol and assessment completed. Orders changed accordingly and make to match his home use. Which is QID and prn.

## 2014-10-09 NOTE — Progress Notes (Signed)
Patient came from home with a Medela wound vac to right anterior lower leg.  Contacted  Regan Rakers, [place] RN and told to leave patient's home vac in use and not to changed since patient told Probation officer and charge RN he has appointment this Friday to get wound vac removed.  Margarita Grizzle told Probation officer if patient's home vac started beeping or had any problems to take it off and apply a saline dressing and be sure to send vac that patient came into hospital with back home with him.

## 2014-10-09 NOTE — Telephone Encounter (Signed)
Update noted, pt is currently in the hospital. I will forward to Dr/nurse as Juluis Rainier

## 2014-10-09 NOTE — Progress Notes (Signed)
[facility]  Patient Status: Active (receiving services up to time of hospitalization)  [facility] is providing the following services: RN  If patient discharges after hours, please call [phone].   Lucas [name] 10/09/2014, 9:38 AM

## 2014-10-10 DIAGNOSIS — I5022 Chronic systolic (congestive) heart failure: Secondary | ICD-10-CM | POA: Diagnosis not present

## 2014-10-10 DIAGNOSIS — J441 Chronic obstructive pulmonary disease with (acute) exacerbation: Secondary | ICD-10-CM | POA: Diagnosis not present

## 2014-10-10 DIAGNOSIS — I480 Paroxysmal atrial fibrillation: Secondary | ICD-10-CM | POA: Diagnosis not present

## 2014-10-10 LAB — MAGNESIUM: MAGNESIUM: 2.2 mg/dL (ref 1.5–2.5)

## 2014-10-10 LAB — BASIC METABOLIC PANEL
ANION GAP: 9 (ref 5–15)
BUN: 42 mg/dL — ABNORMAL HIGH (ref 6–23)
CO2: 28 mmol/L (ref 19–32)
CREATININE: 1.46 mg/dL — AB (ref 0.50–1.35)
Calcium: 9 mg/dL (ref 8.4–10.5)
Chloride: 102 mmol/L (ref 96–112)
GFR calc Af Amer: 55 mL/min — ABNORMAL LOW (ref >90)
GFR calc non Af Amer: 47 mL/min — ABNORMAL LOW (ref >90)
Glucose, Bld: 118 mg/dL — ABNORMAL HIGH (ref 70–99)
Potassium: 4 mmol/L (ref 3.5–5.1)
SODIUM: 139 mmol/L (ref 135–145)

## 2014-10-10 LAB — GLUCOSE, CAPILLARY
GLUCOSE-CAPILLARY: 86 mg/dL (ref 70–99)
GLUCOSE-CAPILLARY: 92 mg/dL (ref 70–99)
Glucose-Capillary: 109 mg/dL — ABNORMAL HIGH (ref 70–99)

## 2014-10-10 MED ORDER — AZITHROMYCIN 500 MG PO TABS
500.0000 mg | ORAL_TABLET | Freq: Every day | ORAL | Status: DC
  Administered 2014-10-10: 500 mg via ORAL
  Filled 2014-10-10 (×2): qty 1

## 2014-10-10 MED ORDER — ALBUTEROL SULFATE (2.5 MG/3ML) 0.083% IN NEBU
INHALATION_SOLUTION | RESPIRATORY_TRACT | Status: AC
  Filled 2014-10-10: qty 3

## 2014-10-10 MED ORDER — FUROSEMIDE 40 MG PO TABS
40.0000 mg | ORAL_TABLET | Freq: Two times a day (BID) | ORAL | Status: DC
  Administered 2014-10-10: 40 mg via ORAL
  Filled 2014-10-10 (×2): qty 1

## 2014-10-10 MED ORDER — ALBUTEROL SULFATE (2.5 MG/3ML) 0.083% IN NEBU
2.5000 mg | INHALATION_SOLUTION | RESPIRATORY_TRACT | Status: DC | PRN
  Administered 2014-10-10 – 2014-10-11 (×2): 2.5 mg via RESPIRATORY_TRACT
  Filled 2014-10-10: qty 3

## 2014-10-10 MED ORDER — GUAIFENESIN 100 MG/5ML PO SOLN
5.0000 mL | ORAL | Status: DC | PRN
  Administered 2014-10-10: 100 mg via ORAL
  Filled 2014-10-10: qty 10

## 2014-10-10 NOTE — Telephone Encounter (Signed)
Pt was admitted to the hospital in respiratory distress.  Please see those notes.

## 2014-10-10 NOTE — Progress Notes (Signed)
[facility] PROGRESS NOTE  Lucas [name] AGT:[id number] DOB: Sep 11, 1945 DOA: 10/08/2014 PCP: Minerva Ends, MD  Assessment/Plan: 69 y.o. male with PMH of CAD s/p CABG, HTN, CHF, CKD, PAF, COPD, PVD s/p R fem pop  On wound vac, chronic respiratory failure on oxygen, chronic pain syndrome is admitted with COPD exacerbation   1. COPD exacerbation; CXR: no clear infiltrates; but some wheezing on exam; will cont steroids, bronchodilators, oxygen, added azithromycin   2. Chronic CHF; systolic HF: echo: LVEF 22-48%;  clinically euvolemic; will change to home oral lasix; 3. PAF not on BB; HR is stable; cont apixaban  4. 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; recommended to f/u with pain clinic; decrease opioid use as possible   Code Status: full Family Communication: d/w patient (indicate person spoken with, relationship, and if by phone, the number) Disposition Plan: home 24-48 hrs    Consultants:  none  Procedures:  none  Antibiotics:  azithromycin 3/31<<<   (indicate start date, and stop date if known)  HPI/Subjective: Alert, oriented   Objective: Filed Vitals:   10/10/14 1338  BP: 127/63  Pulse: 73  Temp: 97.9 F (36.6 C)  Resp: 20    Intake/Output Summary (Last 24 hours) at 10/10/14 1349 Last data filed at 10/10/14 1338  Gross per 24 hour  Intake    480 ml  Output   1000 ml  Net   -520 ml   Filed Weights   10/09/14 0045  Weight: 101.742 kg (224 lb 4.8 oz)    Exam:   General:  Alert, no distress   Cardiovascular: s1,s2 rrr  Respiratory: some wheezing in LL  Abdomen: soft, nt,nd   Musculoskeletal: mild leg edema; wound vac on R leg    Data Reviewed: Basic Metabolic Panel:  Recent Labs Lab 10/08/14 2000 10/09/14 0524 10/10/14 0433  NA 140 138 139  K 3.5 3.8 4.0  CL 103 102 102  CO2 23 23 28   GLUCOSE 114* 226* 118*  BUN 25* 30* 42*  CREATININE 1.15 1.40* 1.46*  CALCIUM 9.4 9.0 9.0  MG  --    --  2.2   Liver Function Tests: No results for input(s): AST, ALT, ALKPHOS, BILITOT, PROT, ALBUMIN in the last 168 hours. No results for input(s): LIPASE, AMYLASE in the last 168 hours. No results for input(s): AMMONIA in the last 168 hours. CBC:  Recent Labs Lab 10/08/14 2000 10/09/14 0524  WBC 10.0 7.9  HGB 12.7* 12.6*  HCT 39.9 40.8  MCV 87.1 88.3  PLT 168 149*   Cardiac Enzymes:  Recent Labs Lab 10/08/14 2000  TROPONINI 0.06*   BNP (last 3 results)  Recent Labs  08/09/14 1055 08/19/14 1408  BNP 375.4* 476.3*    ProBNP (last 3 results)  Recent Labs  03/20/14 0906 04/17/14 0049 05/13/14 2140  PROBNP 185.0* 1368.0* 697.9*    CBG:  Recent Labs Lab 10/09/14 1717 10/09/14 2153 10/10/14 0722 10/10/14 1148  GLUCAP 130* 167* 86 92    No results found for this or any previous visit (from the past 240 hour(s)).   Studies: Dg Chest 2 View  10/08/2014   CLINICAL DATA:  69 year old male with shortness of breath and mid chest pain with dry cough for the past 2 days.  EXAM: CHEST  2 VIEW  COMPARISON:  Chest x-ray 09/09/2014.  FINDINGS: Lung volumes are low. No consolidative airspace disease. No pleural effusions. Linear opacities in lung bases bilaterally, similar to prior examinations, favored  to reflect areas of mild chronic scarring. No evidence of pulmonary edema. Heart size is mildly enlarged (unchanged). Upper mediastinal contours are within normal limits. Atherosclerosis in the thoracic aorta. Status post median sternotomy for CABG, including LIMA.  IMPRESSION: 1. No radiographic evidence of acute cardiopulmonary disease. 2. Mild cardiomegaly. 3. Atherosclerosis.   Electronically Signed   By: Vinnie Langton M.D.   On: 10/08/2014 20:39    Scheduled Meds: . ALPRAZolam  1 mg Oral TID  . apixaban  5 mg Oral BID  . doxazosin  8 mg Oral QHS  . furosemide  60 mg Oral BID  . HYDROcodone-acetaminophen  1 tablet Oral 5 X Daily  . insulin aspart  0-5 Units  Subcutaneous QHS  . insulin aspart  0-9 Units Subcutaneous TID WC  . ipratropium-albuterol  3 mL Nebulization QID  . methylPREDNISolone (SOLU-MEDROL) injection  125 mg Intravenous Once  . nicotine  21 mg Transdermal Daily  . predniSONE  40 mg Oral Q breakfast  . sodium chloride  3 mL Intravenous Q12H  . sodium chloride  3 mL Intravenous Q12H   Continuous Infusions:   Principal Problem:   COPD exacerbation Active Problems:   Essential hypertension   Borderline diabetes   PAF (paroxysmal atrial fibrillation)   CKD (chronic kidney disease), stage III   Chronic systolic CHF (congestive heart failure)   CAD in native artery   Hypertension   Hyperlipidemia   Tobacco abuse   Respiratory distress   SOB (shortness of breath)    Time spent: >35 minutes     Kinnie Feil  [facility] Pager [phone]. If 7PM-7AM, please contact night-coverage at [email or web address], password [email or web address] 10/10/2014, 1:49 PM  LOS: 2 days

## 2014-10-11 DIAGNOSIS — J441 Chronic obstructive pulmonary disease with (acute) exacerbation: Secondary | ICD-10-CM | POA: Diagnosis not present

## 2014-10-11 LAB — GLUCOSE, CAPILLARY
GLUCOSE-CAPILLARY: 105 mg/dL — AB (ref 70–99)
Glucose-Capillary: 84 mg/dL (ref 70–99)

## 2014-10-11 MED ORDER — HYDROMORPHONE HCL 4 MG PO TABS: 4.0000 mg | ORAL_TABLET | ORAL | Status: DC | PRN

## 2014-10-11 MED ORDER — ALBUTEROL SULFATE (2.5 MG/3ML) 0.083% IN NEBU: 2.5000 mg | INHALATION_SOLUTION | RESPIRATORY_TRACT | Status: AC | PRN

## 2014-10-11 MED ORDER — ALPRAZOLAM 1 MG PO TABS: 1.0000 mg | ORAL_TABLET | Freq: Three times a day (TID) | ORAL | Status: AC

## 2014-10-11 MED ORDER — HYDROMORPHONE HCL 4 MG PO TABS: 4.0000 mg | ORAL_TABLET | ORAL | Status: AC | PRN

## 2014-10-11 MED ORDER — HYDROCODONE-ACETAMINOPHEN 10-325 MG PO TABS: 1.0000 | ORAL_TABLET | Freq: Every day | ORAL | Status: AC

## 2014-10-11 MED ORDER — HYDROMORPHONE HCL 4 MG PO TABS: 4.0000 mg | ORAL_TABLET | Freq: Four times a day (QID) | ORAL | Status: DC

## 2014-10-11 MED ORDER — PREDNISONE 10 MG PO TABS: ORAL_TABLET | ORAL | Status: AC

## 2014-10-11 MED ORDER — AZITHROMYCIN 250 MG PO TABS: 250.0000 mg | ORAL_TABLET | Freq: Every day | ORAL | Status: DC

## 2014-10-11 NOTE — Discharge Summary (Signed)
Physician Discharge Summary  Lucas [name]:[id number] DOB: [birth year] DOA: 10/08/2014  PCP: Minerva Ends, MD  Admit date: 10/08/2014 Discharge date: 10/11/2014  Recommendations for Outpatient Follow-up:  1. Pt will need to follow up with PCP in 2-3 weeks post discharge 2. Please obtain BMP to evaluate electrolytes and kidney function 3. Please also check CBC to evaluate Hg and Hct levels 4. Pt advised to continue Prednisone taper upon discharge 5. Also continue Zithromax to complete therapy   Discharge Diagnoses:  Principal Problem:   COPD exacerbation Active Problems:   Essential hypertension   Borderline diabetes   PAF (paroxysmal atrial fibrillation)   CKD (chronic kidney disease), stage III   Chronic systolic CHF (congestive heart failure)   CAD in native artery   Hypertension   Hyperlipidemia   Tobacco abuse   Respiratory distress   SOB (shortness of breath)   Discharge Condition: Stable  Diet recommendation: Heart healthy diet discussed in details   History of present illness:  69 y.o. male with a history of COPD, Chronic Systolic CHF, HTN, who presents to the ED with complaints of worsening SOB,and wheezing with chest tightness over the past 2 days. He denied any fevers or chills. His O2 sats were found to be 89% by EMS on arrival, and he was placed on NCO2 at 2 liters on route. In the ED he was treated with nebulizer treatments and IV Steroids and had mild improvement and was referred for medical admission.   Hospital Course:   1. COPD exacerbation; CXR: no clear infiltrates; pt was treated with BD's scheduled and as needed, steroids, empiric ABX. He has responded well to the regimen and wants to go home today.He will need to complete treatment with ABX and prednisone tapering.   2. Chronic CHF; systolic HF: echo: LVEF 70-96%; clinically euvolemic; changed to home oral lasix; 3. PAF not on BB; HR is stable; cont apixaban  4. Chronic pain  syndrome; patient reports opioid depend  for >7 years; recommended to f/u with pain clinic; decrease opioid use as possible  5. Acute renal failure -  From lasix and poor oral intake, will need close follow and repeat BMP to make sure Cr is stabilizing  6. Obesity - Body mass index is 34.37 kg/(m^2).  Procedures/Studies: CXR 10/08/2014   No radiographic evidence of acute cardiopulmonary disease. Mild cardiomegaly. Atherosclerosis   Discharge Exam: Filed Vitals:   10/11/14 0602  BP: 136/74  Pulse: 59  Temp: 98.3 F (36.8 C)  Resp: 20   Filed Vitals:   10/10/14 2203 10/11/14 0150 10/11/14 0602 10/11/14 0840  BP: 133/79 120/76 136/74   Pulse: 67 78 59   Temp: 97.7 F (36.5 C) 98.1 F (36.7 C) 98.3 F (36.8 C)   TempSrc: Axillary Oral Oral   Resp: 20 20 20    Height:      Weight:   104.055 kg (229 lb 6.4 oz)   SpO2: 93% 96% 95% 96%    General: Pt is alert, follows commands appropriately, not in acute distress Cardiovascular: Regular rate and rhythm,  no rubs, no gallops Respiratory: Clear to auscultation bilaterally, no wheezing, no crackles, no rhonchi Abdominal: Soft, non tender, non distended, bowel sounds +, no guarding  Discharge Instructions  Discharge Instructions    Diet - low sodium heart healthy    Complete by:  As directed      Increase activity slowly    Complete by:  As directed  Medication List    STOP taking these medications        albuterol 108 (90 BASE) MCG/ACT inhaler  Commonly known as:  PROVENTIL HFA;VENTOLIN HFA  Replaced by:  albuterol (2.5 MG/3ML) 0.083% nebulizer solution  You also have another medication with the same name that you need to continue taking as instructed.      TAKE these medications        albuterol (2.5 MG/3ML) 0.083% nebulizer solution  Commonly known as:  PROVENTIL  Take 3 mLs (2.5 mg total) by nebulization 4 (four) times daily. Dx J44.1     albuterol (2.5 MG/3ML) 0.083% nebulizer solution  Commonly  known as:  PROVENTIL  Take 3 mLs (2.5 mg total) by nebulization every 4 (four) hours as needed for wheezing or shortness of breath.     ALPRAZolam 1 MG tablet  Commonly known as:  XANAX  Take 1 tablet (1 mg total) by mouth 3 (three) times daily.     apixaban 5 MG Tabs tablet  Commonly known as:  ELIQUIS  Take 1 tablet (5 mg total) by mouth 2 (two) times daily.     azithromycin 250 MG tablet  Commonly known as:  ZITHROMAX  Take 1 tablet (250 mg total) by mouth daily.     doxazosin 8 MG tablet  Commonly known as:  CARDURA  Take 1 tablet (8 mg total) by mouth at bedtime.     furosemide 40 MG tablet  Commonly known as:  LASIX  Take 1 tablet (40 mg total) by mouth 2 (two) times daily.     HYDROcodone-acetaminophen 10-325 MG per tablet  Commonly known as:  NORCO  Take 1 tablet by mouth 5 (five) times daily.     HYDROmorphone 4 MG tablet  Commonly known as:  DILAUDID  Take 1 tablet (4 mg total) by mouth every 4 (four) hours as needed for severe pain.     Ipratropium-Albuterol 20-100 MCG/ACT Aers respimat  Commonly known as:  COMBIVENT RESPIMAT  Inhale 1 puff into the lungs 4 (four) times daily.     mometasone-formoterol 100-5 MCG/ACT Aero  Commonly known as:  DULERA  Inhale 2 puffs into the lungs 2 (two) times daily.     nitroGLYCERIN 0.3 MG SL tablet  Commonly known as:  NITROSTAT  Place 0.3 mg under the tongue every 5 (five) minutes as needed for chest pain.     predniSONE 10 MG tablet  Commonly known as:  DELTASONE  Take 50 mg tablet today and taper down by 10 mg daily until completed            Follow-up Information    Follow up with Minerva Ends, MD.   Specialty:  Family Medicine   Contact information:   [place] [address] [phone]       Follow up with Faye Ramsay, MD.   Specialty:  Internal Medicine   Why:  As needed call my cell phone [phone]   Contact information:   [address] [place] [address] [phone]        The results of significant diagnostics from this hospitalization (including imaging, microbiology, ancillary and laboratory) are listed below for reference.     Microbiology: No results found for this or any previous visit (from the past 240 hour(s)).   Labs: Basic Metabolic Panel:  Recent Labs Lab 10/08/14 2000 10/09/14 0524 10/10/14 0433  NA 140 138 139  K 3.5 3.8 4.0  CL  103 102 102  CO2 23 23 28   GLUCOSE 114* 226* 118*  BUN 25* 30* 42*  CREATININE 1.15 1.40* 1.46*  CALCIUM 9.4 9.0 9.0  MG  --   --  2.2   CBC:  Recent Labs Lab 10/08/14 2000 10/09/14 0524  WBC 10.0 7.9  HGB 12.7* 12.6*  HCT 39.9 40.8  MCV 87.1 88.3  PLT 168 149*   Cardiac Enzymes:  Recent Labs Lab 10/08/14 2000  TROPONINI 0.06*   BNP: BNP (last 3 results)  Recent Labs  08/09/14 1055 08/19/14 1408  BNP 375.4* 476.3*    ProBNP (last 3 results)  Recent Labs  03/20/14 0906 04/17/14 0049 05/13/14 2140  PROBNP 185.0* 1368.0* 697.9*    CBG:  Recent Labs Lab 10/10/14 0722 10/10/14 1148 10/10/14 1700 10/10/14 2202 10/11/14 0747  GLUCAP 86 92 109* 105* 84     SIGNED: Time coordinating discharge: Over 30 minutes  MAGICK-Orville Widmann, MD  [facility] 10/11/2014, 10:10 AM Pager [phone]  If 7PM-7AM, please contact night-coverage [email or web address] Password [email or web address]

## 2014-10-11 NOTE — Discharge Instructions (Signed)

## 2014-10-17 ENCOUNTER — Encounter: Payer: Self-pay | Admitting: Adult Health

## 2014-10-17 ENCOUNTER — Ambulatory Visit (INDEPENDENT_AMBULATORY_CARE_PROVIDER_SITE_OTHER): Payer: Medicare Other | Admitting: Adult Health

## 2014-10-17 VITALS — BP 140/80 | HR 87 | Temp 97.8°F | Ht 68.0 in | Wt 228.2 lb

## 2014-10-17 DIAGNOSIS — R918 Other nonspecific abnormal finding of lung field: Secondary | ICD-10-CM | POA: Diagnosis not present

## 2014-10-17 DIAGNOSIS — I739 Peripheral vascular disease, unspecified: Secondary | ICD-10-CM

## 2014-10-17 DIAGNOSIS — J441 Chronic obstructive pulmonary disease with (acute) exacerbation: Secondary | ICD-10-CM

## 2014-10-17 DIAGNOSIS — I5022 Chronic systolic (congestive) heart failure: Secondary | ICD-10-CM

## 2014-10-17 MED ORDER — ROFLUMILAST 500 MCG PO TABS: 500.0000 ug | ORAL_TABLET | Freq: Every day | ORAL | Status: AC

## 2014-10-17 NOTE — Assessment & Plan Note (Signed)
Right middle lobe lung nodules stable on last CT in November 2015 [place] for repeat CT in June of this year

## 2014-10-17 NOTE — Patient Instructions (Signed)
Begin Daliresp daily . -this is to help your COPD from flaring .  Continue on Dulera 2 puffs Twice daily   Make sure to use Combivent 1 puff  Four times a day   Continue on  Prednisone 10mg  daily.  Mucinex DM Twice daily   As needed  Cough/congestion  Work on not smoking.  Please be careful and do not eat FOODS THAT ARE HIGH IN SODIUM THAT CAUSE YOU TO RETAIN FLUID .  Follow up Dr. Chase Caller in 6 weeks and As needed   Please contact office for sooner follow up if symptoms do not improve or worsen or seek emergency care

## 2014-10-17 NOTE — Assessment & Plan Note (Signed)
Continue follow-up as planned Continue  with wound care

## 2014-10-17 NOTE — Assessment & Plan Note (Signed)
Frequent COPD exacerbations an active smoker Encouraged on smoking cessation Daliresp trial   Plan  Begin Daliresp daily . -this is to help your COPD from flaring .  Continue on Dulera 2 puffs Twice daily   Make sure to use Combivent 1 puff  Four times a day   Continue on  Prednisone 10mg  daily.  Mucinex DM Twice daily   As needed  Cough/congestion  Work on not smoking.  Please be careful and do not eat FOODS THAT ARE HIGH IN SODIUM THAT CAUSE YOU TO RETAIN FLUID .  Follow up Dr. Chase Caller in 6 weeks and As needed   Please contact office for sooner follow up if symptoms do not improve or worsen or seek emergency care

## 2014-10-17 NOTE — Assessment & Plan Note (Signed)
Appears compensated Continue follow with CHF clinic / cardiology Low salt diet

## 2014-10-17 NOTE — Progress Notes (Signed)
Subjective:    Patient ID: Lucas [name], male    DOB: 1945/08/07, 69 y.o.   MRN: [id number]  HPI    Subjective:    Patient ID: Lucas [name], male    DOB: [birth year], 69 y.o.   MRN: [id number]  HPI #Smoking  - quit sept 2014 but having withdrawals  #Moderate COPD Spirometry 04/18/12       06/02/12 FEV1 1.64L 46%             1.94  55%   FVC 2.78L 60%               3.01  65% FEV1/FVC 59                   64   #CHF/CAD - s/p CABG. C - Oct 4315 Systolic CHF admission ef 30% - Sept 4008: Diastolic CHF admission  - DEc 2013: EF 30% Admission for Unstable angiona but welll compensated CHF  #Obesity  - Body mass index is 33.15 kg/(m^2). on 08/31/2013 - Have been ruled out January 2015. Advised one liter of oxygen nocturnally    #AECOPD  - 03/26/13 - office Rx - 04/03/13 - AECOPD with Acute Diast CHF admission though 04/06/13 - Jan 2014 with doxy and pred in office with extended pred a week later  #imaging  - 04/03/13 CXR - CHF changes. NEver had CT -   OV 08/31/2013 Chief Complaint  Patient presents with  . COPD    follow-up. Pt states he has been having increased productive cough with yellow phlegm, increased SOB, chest congestion,  whezing and chest tightness x 3 days.   Followup COPD - Gold stage 2, DLCO 46% -  spet 2014,Full PFT   - At last visit mid January 2015 he had a COPD exacerbation with treated with doxycycline and prednisone. He improved after this. In the interim he had a cardiac cath this is listed below. However for the last 3 days is having increased cough, change in color of sputum, increasing wheeze and increased sputum volume. COPD cat score is in the 26s and reflects COPD exacerbation. He takes Combivent respimat which helps him but he does not take ANoro or breo because this does not help him. He is open to taking her steroids  New issue: He is open to having low-dose CT scan of the chest for lung cancer screening because Medicare is paying for it.  However, our system not yet set up for this    Past, Family, Social reviewed: Since  last visit he has had a cardiac catheterization that apparently was clean however, he does have right lower the claudication and apparently he has stenosis. He has a followup with cardiology pending. He is concerned that he might not be a revascularization candidate because of his COPD. In addition Dr. Elsworth Soho for sleep evaluation he tells me that he does not have sleep apnea; confirmed on electronic medical records dated 07/24/2013. His been advised one liters oxygen for sleep    #COPD exacerbation  - you are in flare up of copd again - Take prednisone 40 mg daily x 2 days, then 20mg  daily x 2 days, then 10mg  daily x 2 days, then 5mg  daily x 2 days and stop  take levaquin 500mg  once daily  X 6 days   #COPD Continue combivent respimat 4 times daily Start QVAR 34mcg, 2 puff twice daily Use albuterol 2 puff as needed No need for ANORO or BREO because this is  not helping you Continue oxygen 18h/day  #Lung cancer screen  - do LDCT chest at next visit when we have systems in place  #preop evaluation for vascular surgery  - I think you might be handle surgery for leg but at some risk; we will have to reassess this formally later  #FOllowuo Return to see my NP Teighan Aubert for med calendar - next several days to few weeks REturn to see me in 3 months  - spirometry at followujp      11/30/2013 Follow up  Returns for  3 month COPD follow up .  Reports is doing well.  Complains of hoarseness/sore throat .  Has been hospitalized 3x since last ov for CHF/SOB Most recent admit 5/6 for decompensated CHF w/ acute resp fail w/ hypoxia .  He was diuresis with decreased wt and swelling.  Followed closely by cards.  Says swelling is doing much better.   Currently on Dulera Twice daily  And Combivent Four times a day  And uses Duoneb As needed .  Remains on O2 at 2 l/m At bedtime  And with activity As needed   Denies  any hemoptysis, fever, or orthopnea, PND, or increased leg swelling   OV 12/31/2013  Chief Complaint  Patient presents with  . Follow-up    Pt c/o dyspnea with exeriton and little ambulation. Pt states he had a blockage in his right calf and had a "balloon placed" last week. Pt c/o dry persistant cough and left chest pain with actvity.     Followup Gold stage II COPD and multiple medical problems  - COPD: Currently disease is stable. His inhaler regimen is weird but he wont change it. HE is on dulera and  combivent scheduled. Also on oi2 18h/day. Has class II dyspnea on exertion. Minimal cough only. His dyspnea is out of proportion to severity of copd and is associated with claudication but no chest pain  - Smoking: He thinks he quit smoking but he is actually smoking electronic cigarettes. I cautioned him that this is bad  - Lung cancer screening: Due to cost and insurance issues he has not had CT scan  - . CAT COPD Symptom & Quality of Life Score (GSK trademark) 0 is no burden. 5 is highest burden 03/26/2013  04/23/2013  07/17/2013  08/31/2013 aecopd  Never Cough -> Cough all the time 4 2 3 5   No phlegm in chest -> Chest is full of phlegm 4 2 3 4   No chest tightness -> Chest feels very tight 3 2 3 4   No dyspnea for 1 flight stairs/hill -> Very dyspneic for 1 flight of stairs 4 4 4 4   No limitations for ADL at home -> Very limited with ADL at home 4 2 3 4   Confident leaving home -> Not at all confident leaving home 2 2 2 4   Sleep soundly -> Do not sleep soundly because of lung condition 4 3 3 4   Lots of Energy -> No energy at all 4 4 4 5   TOTAL Score (max 40)  29 21 25  34   Past medical history reviewed: He continues to have claudication despite balloon placement in his femoral artery according to his history. Dyspnea and claudication occurs at the same time. He also has chronic pain but does not have a pain clinic. Apparently in the hospital Dilaudid helped him and he wants  this   01/29/2014 Acute OV  /Post hosp/ER  follow up  COPD , CHF Atrial Fib  Patient presents for a post hospital followup. He was admitted June 28 through July 1 for COPD, exacerbation, and decompensated, acute on chronic congestive heart failure. Patient was treated with IV antibiotics, steroids, and nebulized bronchodilators. Patient did have atypical chest pain. He underwent a CT chest angiogram that was negative for PE.  There were very small pulmonary nodules measuring up to 6 mm in the right middle and lower lobes. Patient was seen by cardiology and ruled out for acute MI and treated with medical management. Patient was treated with diuresis. Since discharge. Patient reports that he has continued to have cough, shortness, of breath and wheezing. He was called in doxycycline on July 6, along with a prednisone taper.  Patient was seen in the emergency room on July 14 and given prednisone taper. Patient says that he continues to have muscle cramping. He is currently on Lasix 40 mg daily He denies any hemoptysis, fever, chest pain, vomiting, diarrhea, bloody stools. He does complain of hoarseness.   02/22/2014 [facility] follow up  Returns for post hospital follow up .  Readmitted for COPD and CHF exacerbation on 7/23-28 tx w/ steroids and diuresis .  Continues to have dry cough, wheezing, DOE, PND, edema in abdomen.   Denies hemoptysis, f/c/s, n/v/d.  finished prednisone this morning.  Smoked last 3 weeks ago, we discussed smoking cessation. Uses e cig , we discussed dangers of these as well.  Wt is up today drink 2 Dr. Malachi Bonds and sausge biscuit and gravy. We discussed his salt intake.  More swollen and bloated today.  Followed by CHF clinic. Suppose to be on Lasix 40mg  in am and 20mg  in evening . But only takes 40mg  in evening.  On 2 l/m O2 At bedtime   We discussed diet and med compliance along with need for closer follow up to help keep out of ER and hospital .  >taper pred to 10mg   daily , low salt diet   03/21/2014 Acute OV  COPD former smoker on chronic steroids and Nocturnal O2, CAD, PAD, CHF (EF40%) , chronic pain on daily narcs  Complains of increased SOB, wheezing, prod cough with yellow mucus, hoarseness x2 days.  Seen by PCP , given   was given Avelox 400mg  x7d and prednisone 10mg  6d taper.  Was recently admitted to hospital for CHF flare, tx w/ diuresis w/ decreased edema. follow up labs yesterday showed declining bnp . K+ was low, rx called in per cards.  Has stopping smoking but now vaping -advised on dangers.  CXR on 8/29 with chronic changes   OV 04/01/14  Follow up COPD -chronic steroid -pred 10mg  daily and Nocturnal O2 Hx of CAD , CHF (EF40%) , chronic pain on daily narcs.  Returns for 3 month follow COPD Reports breathing is improved since last ov; still having some head and chest congestion w/ PND, DOE, wheezing, chest tightness.  Denies any f/c/s, n/v/d, hemoptysis Had recent flare of COPD , tx w/ Avelox and pred burst. Says he is feeling better.  Very upset with new PCP , d/t tapering off Norco . Has been referred to pain clinic . Says he can not live without pain meds. Is waiting to hear back from pain clinic .  Last CXR 03/09/14 w/ chronic changes , no acute process Declines flu shot today . Discussed importance.  Discussed Pneumovax and Prevnar-declines.      OV 06/10/2014  Follow-up COPD with recurrent exacerbations. On chronic prednisone 10 mg daily based on a subjective  physician and nocturnal oxygen. Symptoms associated with obesity, peripheral vascular disease, chronic systolic heart failure, chronic pain on daily narcotics   Returns for routine 2 month follow-up for COPD. Overall stable. No new acute symptoms. He insists that he'll only take 10 mg of prednisone daily. He feels at 5 mg per day does not work for him. He has no new acute issues in terms of his COPD. He wants refill on his prednisone 10 mg per day.  Other issues  - Chronic  systolic heart failure: He wants refill on his Lasix and Cardura. He reports this is stable. He will see his cardiologist in 1 month but he wants refills too tight over till then  - Preoperative pulmonary assessment: This is new. He's having a femoropopliteal bypass surgery according to his history by Dr. Annamarie Major in the next several weeks to a few months. Currently he is not in exacerbation. Overall his morbidities put him at moderate risk at least for pulmonary complications following the surgery  - Lung nodule 6 mm right lower lobe June 2015: 27 CT scan of the chest in spring 2016   - Looking: He has quit but he is on surrounded by several friends who smoke actively around him  - Immunization history: He has consistently declined pneumonia vaccines  10/17/2014 Follow up : COPD-steroid depnendent  /Chronic Hypoxic RF on nocturnal O2/ CHF /Lung nodule Pt returns for 4 month follow up . He has frequent hospital admits .  >Has known COPD , on Dulera and combivent Four times a day  . On prednisone daily at 10mg  .  Recent admission , with AECOPD last week, tx w/ steroids and abx. CXR w/ chronic changes . Admits still smoking cigs and using e cigs. Staring to feel better. Has daily cough and gets winded easily   >Lung nodule -last CT 05/2014 , 6 mm RLL , due for repeat in June   >CHF -continues to struggle with fluid issues. Last admit for decompensation in Feb 2016. Weighs daily . On lasix daily . No sign increase in edema.   >PAD -had fempo bypass surgery 07/2014 . Had some trouble with wound healing. Readmitted with cellulitis . Starting to turn corner now. Wound changes at home with home RN.   Patient denies any chest pain, fever, orthopnea, PND or nausea, vomiting, diarrhea   Review of Systems  Constitutional: Negative for fever and unexpected weight change.  HENT: Negative for congestion, dental problem, ear pain, nosebleeds, postnasal drip, rhinorrhea, sinus pressure, sneezing, sore  throat and trouble swallowing.   Eyes: Negative for redness and itching.  Respiratory: Positive for  shortness of breath. Negative for chest tightness and wheezing.   Cardiovascular:  . Negative for palpitations and leg swelling.  Gastrointestinal: Negative for nausea and vomiting.  Genitourinary: Negative for dysuria.  Musculoskeletal: Negative for joint swelling.  Skin: Negative for rash.  Neurological: Negative for headaches.  Hematological: Does not bruise/bleed easily.  Psychiatric/Behavioral: Negative for dysphoric mood. The patient is not nervous/anxious.      Objective:   Physical Exam   HENT:  Head: Normocephalic and atraumatic.  Right Ear: External ear normal.  Left Ear: External ear normal.  Mouth/Throat: Oropharynx is clear and moist.  No thrush noted.  mallampatti class 3-4,Eyes: Conjunctivae and EOM are normal. Pupils are equal, round, and reactive to light. Right eye exhibits no discharge. Left eye exhibits no discharge. No scleral icterus.  Neck: Normal range of motion. Neck supple. No JVD present. No tracheal  deviation present. No thyromegaly present.  Cardiovascular: Normal rate, regular rhythm and intact distal pulses.  Exam reveals no gallop and no friction rub.   No murmur heard.tr-+ edema , along right lower leg w/ bandage and venous stasis changes noted.  Pulmonary/Chest : decreased BS in bases, no wheezing  Abdominal: Soft. Bowel sounds are normal. He exhibits no distension and no mass. There is no tenderness. There is no rebound and no guarding.  Musculoskeletal: Normal range of motion. Lymphadenopathy:    He has no cervical adenopathy.  Neurological: He is alert and oriented to person, place, and time. He has normal reflexes. No cranial nerve deficit. Coordination normal.           Assessment & Plan:

## 2014-10-18 ENCOUNTER — Encounter: Payer: Self-pay | Admitting: Surgery

## 2014-10-21 ENCOUNTER — Other Ambulatory Visit: Payer: Self-pay | Admitting: *Deleted

## 2014-10-21 ENCOUNTER — Telehealth: Payer: Self-pay | Admitting: *Deleted

## 2014-10-21 ENCOUNTER — Encounter: Payer: Self-pay | Admitting: Surgery

## 2014-10-21 ENCOUNTER — Ambulatory Visit (INDEPENDENT_AMBULATORY_CARE_PROVIDER_SITE_OTHER): Payer: Self-pay | Admitting: Surgery

## 2014-10-21 VITALS — BP 152/74 | HR 56 | Ht 68.0 in | Wt 227.0 lb

## 2014-10-21 DIAGNOSIS — I70219 Atherosclerosis of native arteries of extremities with intermittent claudication, unspecified extremity: Secondary | ICD-10-CM

## 2014-10-21 DIAGNOSIS — I739 Peripheral vascular disease, unspecified: Secondary | ICD-10-CM

## 2014-10-21 NOTE — Telephone Encounter (Signed)
[facility] submitted PA for Daliresp 500 mcg 1 qd. I called cover my meds to finish process. Spoke to [employer]. Medication approved 10/21/14--10/26/15.PA [id number] J44.9 ICD-10 Pharmacy called and authorization given.

## 2014-10-21 NOTE — Telephone Encounter (Signed)
Noted Thanks Davy Pique!

## 2014-10-21 NOTE — Progress Notes (Signed)
Patient name: Lucas [name] MRN: [id number] DOB: Sep 05, 1945 Sex: male     Chief Complaint  Patient presents with  . Re-evaluation    3 wk f/u    HISTORY OF PRESENT ILLNESS The patient is back today for follow-up.  He underwent recanalization of an occluded right superficial femoral artery and subsequent stenting which failed early.  He then underwent a right femoral to below knee popliteal artery bypass graft with PTFE on 07/26/2014.  This was done for severe claudication/rest pain.  The patient was admitted several times for cellulitis which was treated with antibiotics.  He also had several bouts of COPD exacerbations.  His postoperative course was further been complicated by heart failure and significant lower extremity swelling which likely contributed to his cellulitis.  The below knee incision opened up and had significant drainage.  This was initially controlled with a wound VAC.  It has now been transitioned to wet-to-dry dressing changes.  He does not report any significant drainage.  His ambulatory status has improved after bypass.   Past Medical History  Diagnosis Date  . CAD (coronary artery disease)     a. s/p CABG x 5;  b. 04/2012 Cath: patent LIMA->LAD and VG->OM3, all other grafts occluded. c. Cath 12/2013: patent SVG-OM3 & LIMA-LAD, known occ other VG.  Marland Kitchen Hypertension   . Adjustment disorder with anxiety   . Hyperlipidemia     a. Unable to take statins.  Marland Kitchen BPH (benign prostatic hypertrophy)   . COPD (chronic obstructive pulmonary disease)     a. on home O2.  . Arthritis   . Ischemic cardiomyopathy     a. 06/2013 Echo: EF 30-35%, no reg wma's, Gr2 DD, triv AI, mod dil LA, nl RV fxn.  . Chronic systolic CHF (congestive heart failure)     a. 06/2013 Echo: EF 30-35%. b. 11/08/13 EF 40-45%, LVH, HK of the mid-distalanterseptal myocardium, trivial Ao regurg, calcified mitral annulus, RA mildly dilated  . Prostate cancer dx'd 2012  . Renal cancer dx'd 1997    lt  nephrectomy  . CKD (chronic kidney disease), stage III   . Tobacco abuse     a. 73 yr hx as of 2015, transitioned to e-cig.  Marland Kitchen Syncope     a. 08/2013: the day following cath, no injury, had received multiple doses of Versed for anxiety - this was felt to be a contributing factor.  Marland Kitchen PAF (paroxysmal atrial fibrillation)     a. on Eliquis 5 mg bid  . Hypotension     a. H/o soft BP prohibiting ACEI/ARB use.  . Chronic pain   . PAD (peripheral artery disease)     a. 12/2013: PV angio s/p angioplasty to R external iliac artery stenosis, also has R SFA occlusion with reconstitution of above-knee popliteal artery, 2 vessel runoff via peroneal and posterior tib.b. LE doppler 02/2014 ABI right 0.52, left 0.95  . Pulmonary nodules     a. 12/2013:  small pulmonary nodules measuring up to 32mm in RM/LL, f/u recommended 6-12 months.  . Anxiety Dx 2004  . Anginal pain     none recent  . Shortness of breath dyspnea   . Rheumatic fever     child  . Family history of adverse reaction to anesthesia     SISTER HAD COPLICATIONS  TOOK 2 WEEKS TO WAKE UP "    Past Surgical History  Procedure Laterality Date  . Coronary artery bypass graft      x  5  . Cardiac catheterization    . Nephrectomy      left nephrectomy for ca  . Posterior cervical laminectomy      x 8   limited ROM  and can't lie flat  . Heart stents      x 5  . Robot assisted laparoscopic radical prostatectomy      for prostate cancer  . Cystoscopy with litholapaxy  05/01/2012    Procedure: CYSTOSCOPY WITH LITHOLAPAXY;  Surgeon: Dutch Gray, MD;  Location: [facility];  Service: Urology;  Laterality: N/A;  . Prostate cancer    . Kidney surgery Left 1994  . Left and right heart catheterization with coronary/graft angiogram N/A 04/24/2012    Procedure: LEFT AND RIGHT HEART CATHETERIZATION WITH Beatrix Fetters;  Surgeon: Sherren Mocha, MD;  Location: [facility] CATH LAB;  Service: Cardiovascular;  Laterality: N/A;  . Left and right heart  catheterization with coronary angiogram N/A 08/21/2013    Procedure: LEFT AND RIGHT HEART CATHETERIZATION WITH CORONARY ANGIOGRAM;  Surgeon: Josue Hector, MD;  Location: [facility] CATH LAB;  Service: Cardiovascular;  Laterality: N/A;  . Lower extremity angiogram N/A 08/21/2013    Procedure: LOWER EXTREMITY ANGIOGRAM;  Surgeon: Josue Hector, MD;  Location: [facility] CATH LAB;  Service: Cardiovascular;  Laterality: N/A;  . Abdominal aortagram N/A 12/25/2013    Procedure: ABDOMINAL AORTAGRAM;  Surgeon: Serafina Mitchell, MD;  Location: [facility] CATH LAB;  Service: Cardiovascular;  Laterality: N/A;  . Left heart catheterization with coronary/graft angiogram N/A 04/19/2014    Procedure: LEFT HEART CATHETERIZATION WITH Beatrix Fetters;  Surgeon: Jettie Booze, MD;  Location: [facility] CATH LAB;  Service: Cardiovascular;  Laterality: N/A;  . Abdominal aortagram N/A 06/26/2014    Procedure: ABDOMINAL AORTAGRAM;  Surgeon: Serafina Mitchell, MD;  Location: [facility] CATH LAB;  Service: Cardiovascular;  Laterality: N/A;  . Femoral-popliteal bypass graft Right 07/26/2014    Procedure: BYPASS GRAFT FEMORAL-POPLITEAL ARTERY- right leg with PTFE;  Surgeon: Serafina Mitchell, MD;  Location: [place];  Service: Vascular;  Laterality: Right;    History   Social History  . Marital Status: Widowed    Spouse Name: N/A  . Number of Children: N/A  . Years of Education: N/A   Occupational History  . disabled    Social History Main Topics  . Smoking status: Current Every Day Smoker -- 0.05 packs/day for 54 years    Types: Cigarettes    Last Attempt to Quit: 08/14/2014  . Smokeless tobacco: Former Systems developer    Quit date: 04/19/2012     Comment: Using e-cig now  smokes once in a while  . Alcohol Use: 0.0 oz/week    0 Standard drinks or equivalent per week     Comment: vodka cranberry occ  . Drug Use: No     Comment: smokes once in a whlie  . Sexual Activity: Not Currently   Other Topics Concern  . Not on file   Social History  Narrative    He lives in [place] by himself.  He is a widower.    He continues to smoke about 4 or 5  cigarettes a day but has a greater than 100 pack-year history having  previously smoked up to 3-4 packs a day over the span about 48 years.     He denies alcohol or drug use.  He is retired secondary to disability     since the 37s.     Family History  Problem Relation Age of Onset  .  Diabetes Mother   . Heart disease Mother   . Hypertension Mother   . Heart attack Mother   . Coronary artery disease    . Heart disease Father   . Diabetes Father   . Heart attack Father   . Heart disease Brother   . Heart attack Brother   . COPD Sister     Allergies as of 10/21/2014 - Review Complete 10/21/2014  Allergen Reaction Noted  . Aspirin Other (See Comments) 08/19/2014  . Ativan [lorazepam] Other (See Comments) 04/21/2012  . Lisinopril Cough 08/22/2013  . Morphine and related Other (See Comments) 04/18/2012  . Ibuprofen Hives, Nausea And Vomiting, and Rash 04/17/2012    Current Outpatient Prescriptions on File Prior to Visit  Medication Sig Dispense Refill  . albuterol (PROVENTIL) (2.5 MG/3ML) 0.083% nebulizer solution Take 3 mLs (2.5 mg total) by nebulization 4 (four) times daily. Dx J44.1 360 mL 5  . albuterol (PROVENTIL) (2.5 MG/3ML) 0.083% nebulizer solution Take 3 mLs (2.5 mg total) by nebulization every 4 (four) hours as needed for wheezing or shortness of breath. 75 mL 12  . ALPRAZolam (XANAX) 1 MG tablet Take 1 tablet (1 mg total) by mouth 3 (three) times daily. 90 tablet 2  . apixaban (ELIQUIS) 5 MG TABS tablet Take 1 tablet (5 mg total) by mouth 2 (two) times daily. 60 tablet 3  . doxazosin (CARDURA) 8 MG tablet Take 1 tablet (8 mg total) by mouth at bedtime. 60 tablet 0  . furosemide (LASIX) 40 MG tablet Take 1 tablet (40 mg total) by mouth 2 (two) times daily. 60 tablet 1  . HYDROcodone-acetaminophen (NORCO) 10-325 MG per tablet Take 1 tablet by mouth 5 (five) times daily.  120 tablet 0  . HYDROmorphone (DILAUDID) 4 MG tablet Take 1 tablet (4 mg total) by mouth every 4 (four) hours as needed for severe pain. 120 tablet 0  . Ipratropium-Albuterol (COMBIVENT RESPIMAT) 20-100 MCG/ACT AERS respimat Inhale 1 puff into the lungs 4 (four) times daily. 1 Inhaler 5  . mometasone-formoterol (DULERA) 100-5 MCG/ACT AERO Inhale 2 puffs into the lungs 2 (two) times daily. 1 Inhaler 5  . nitroGLYCERIN (NITROSTAT) 0.3 MG SL tablet Place 0.3 mg under the tongue every 5 (five) minutes as needed for chest pain.    . predniSONE (DELTASONE) 10 MG tablet Take 50 mg tablet today and taper down by 10 mg daily until completed 15 tablet 5  . roflumilast (DALIRESP) 500 MCG TABS tablet Take 1 tablet (500 mcg total) by mouth daily. 30 tablet 5  . roflumilast (DALIRESP) 500 MCG TABS tablet Take 1 tablet (500 mcg total) by mouth daily. 14 tablet 0   No current facility-administered medications on file prior to visit.      PHYSICAL EXAMINATION:   Vital signs are  Filed Vitals:   10/21/14 0907 10/21/14 0910  BP: 153/82 152/74  Pulse: 86 56  Height: 5\' 8"  (1.727 m)   Weight: 227 lb (102.967 kg)   SpO2: 99%    Body mass index is 34.52 kg/(m^2). General: The patient appears their stated age. HEENT:  No gross abnormalities Pulmonary:  Non labored breathing Abdomen: Soft and non-tender Musculoskeletal: There are no major deformities. Neurologic: No focal weakness or paresthesias are detected, Skin: Wound measures approximately 3 cm in length and is 2 mm deep. Psychiatric: The patient has normal affect. Cardiovascular: Brisk posterior tibial Doppler signal in the right   Diagnostic Studies None  Assessment: Status post right femoral-popliteal bypass graft Plan: The patient  will continue with wet-to-dry dressing changes.  Once his wound has completely healed, he will need to go into compression stockings to help with the edema.  I have scheduled to follow-up with me in 3 months with  a repeat duplex ultrasound and ankle-brachial indices.  Eldridge Abrahams, M.D. [facility] Office: [phone] Pager:  [phone]

## 2014-10-23 ENCOUNTER — Telehealth: Payer: Self-pay | Admitting: Family Medicine

## 2014-10-23 NOTE — Telephone Encounter (Signed)
Found DOA at 11:05 AM Last seen 3:00 PM   Found in living room. Appears natural.  Assessment: Patient known chronically ill with multiple medical problems, end stage COPD and CHF. Frequent hospitalizations.   Plan: I will sign death certificate No need for medical examiner

## 2014-11-05 ENCOUNTER — Telehealth: Payer: Self-pay | Admitting: Family Medicine

## 2014-11-05 ENCOUNTER — Encounter: Payer: Self-pay | Admitting: Family Medicine

## 2014-11-05 NOTE — Progress Notes (Signed)
Called [facility]. Patient deceased on November 08, 2014. I received a re-certification of home health skilled nursing services form. ? What to do. Advised to sign form as initial date of service was prior to patient's passing. [facility] had noted that patient is decease.   Form signed and placed in to be faxed.

## 2014-11-05 NOTE — Telephone Encounter (Signed)
Called [facility]. Patient deceased on 11-16-2014. I received a re-certification of home health skilled nursing services form. ? What to do. Advised to sign form as initial date of service was prior to patient's passing. [facility] had noted that patient is decease.   Form signed and placed in to be faxed.

## 2014-11-10 DEATH — deceased

## 2014-11-25 ENCOUNTER — Other Ambulatory Visit: Payer: Medicare Other

## 2014-12-03 ENCOUNTER — Ambulatory Visit: Payer: Medicare Other | Admitting: Internal Medicine

## 2015-01-20 ENCOUNTER — Encounter (HOSPITAL_COMMUNITY): Payer: Medicare Other

## 2015-01-20 ENCOUNTER — Ambulatory Visit: Payer: Medicare Other | Admitting: Surgery

## 2015-01-20 ENCOUNTER — Other Ambulatory Visit (HOSPITAL_COMMUNITY): Payer: Medicare Other

## 2015-03-10 IMAGING — CT CT ANGIO CHEST
2 of 6 series · 18 of 36 positions shown · IV contrast (OMNIPAQUE)
Comparison: 01/07/2014 as well as abdominal CT 08/22/2013.

CLINICAL DATA: Cough and shortness of breath 5 days with anterior
chest pain. Rule out pulmonary embolism.

EXAM:
CT ANGIOGRAPHY CHEST WITH CONTRAST
TECHNIQUE: Multidetector CT imaging of the chest was performed using the
standard protocol during bolus administration of intravenous
contrast. Multiplanar CT image reconstructions and MIPs were
obtained to evaluate the vascular anatomy.
CONTRAST:  100mL OMNIPAQUE IOHEXOL 350 MG/ML SOLN

[Series 7: pe thins @ 1mm · axial · 0.80mm/px · z∈[-242,+10]mm · 17 of 281 slices shown]
[im 15/281  lung]
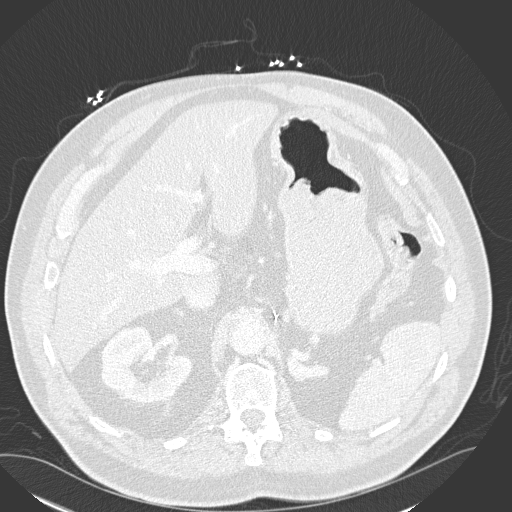
[im 29/281  mediastinal]
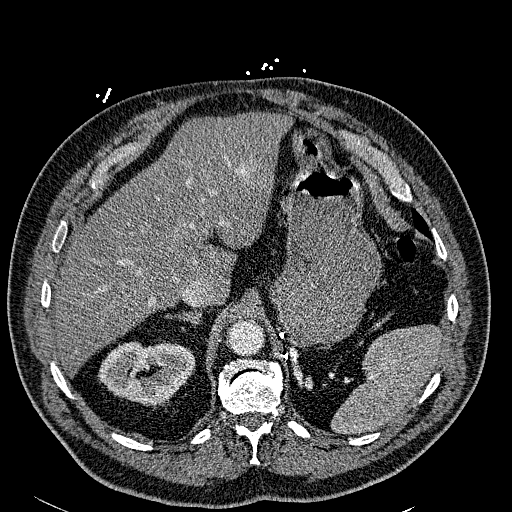
[im 43/281  lung]
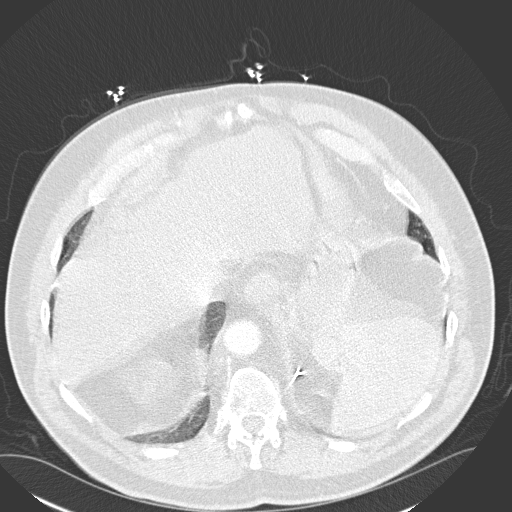
[im 57/281  mediastinal]
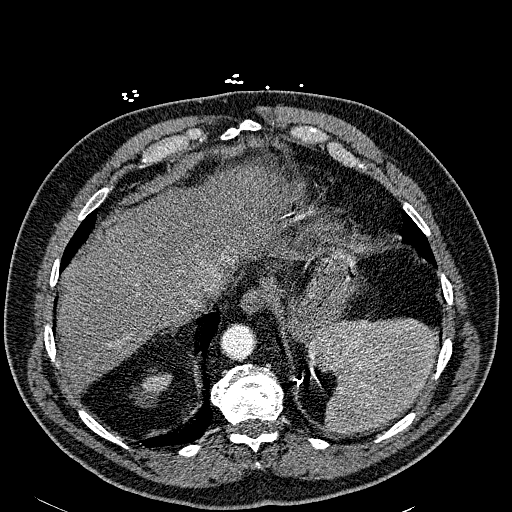
[im 85/281  lung]
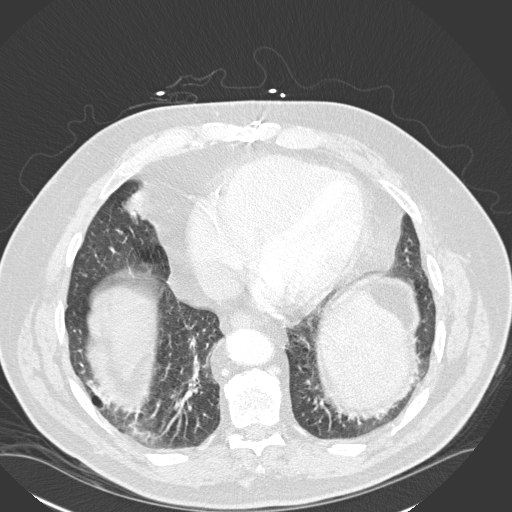
[im 99/281  mediastinal]
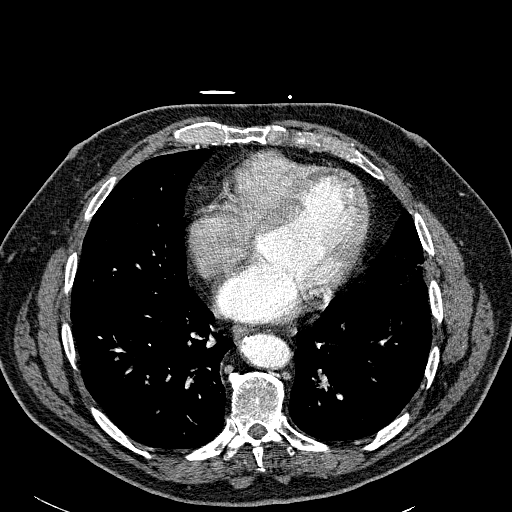
[im 113/281  lung]
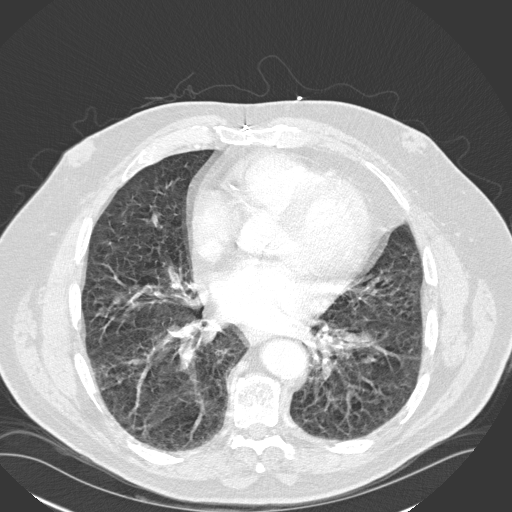
[im 127/281  mediastinal]
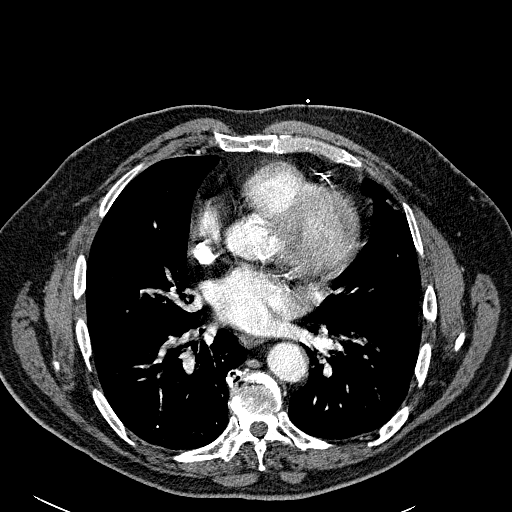
[im 141/281  lung]
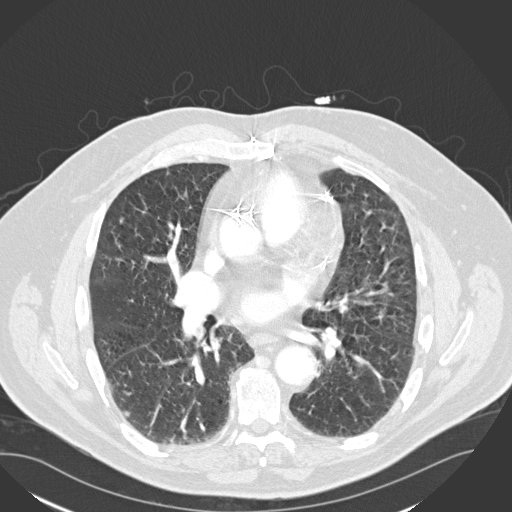
[im 155/281  mediastinal]
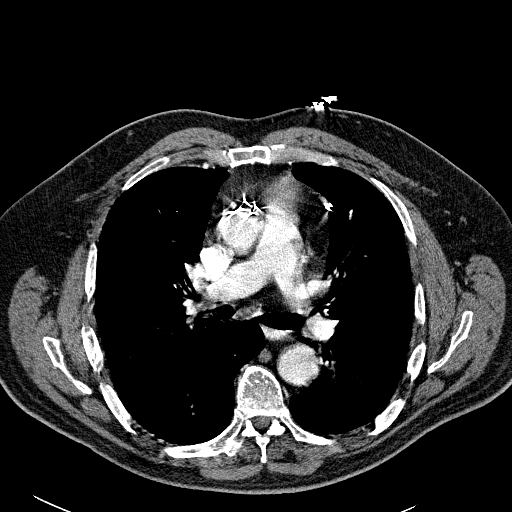
[im 169/281  lung]
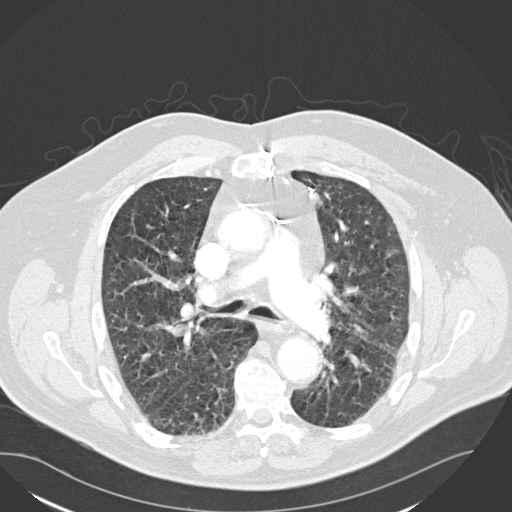
[im 183/281  mediastinal]
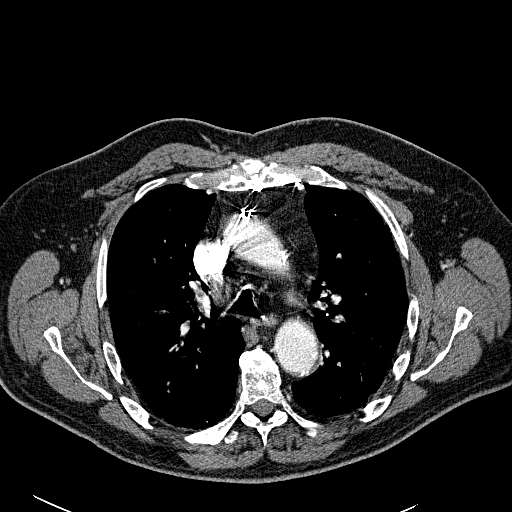
[im 197/281  lung]
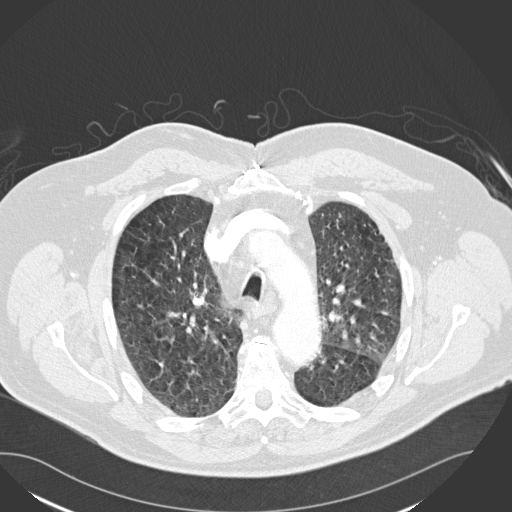
[im 225/281  mediastinal]
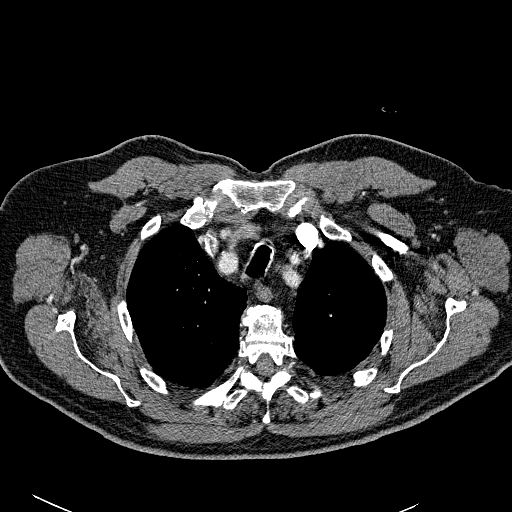
[im 239/281  lung]
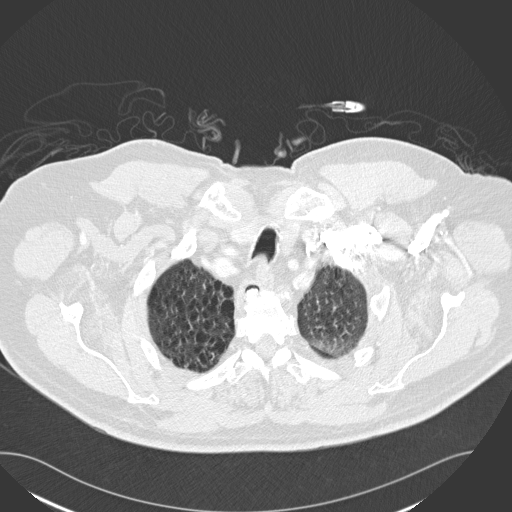
[im 253/281  mediastinal]
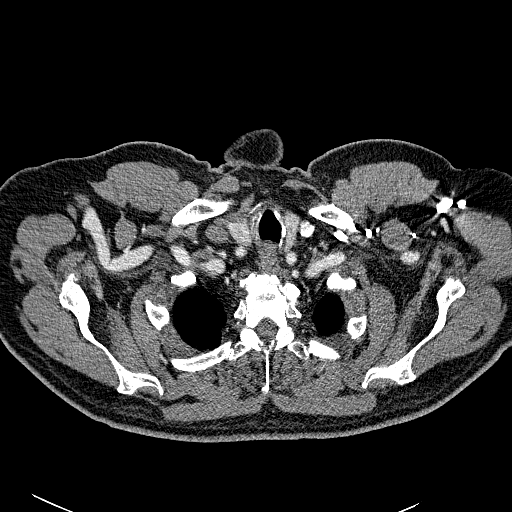
[im 267/281  lung]
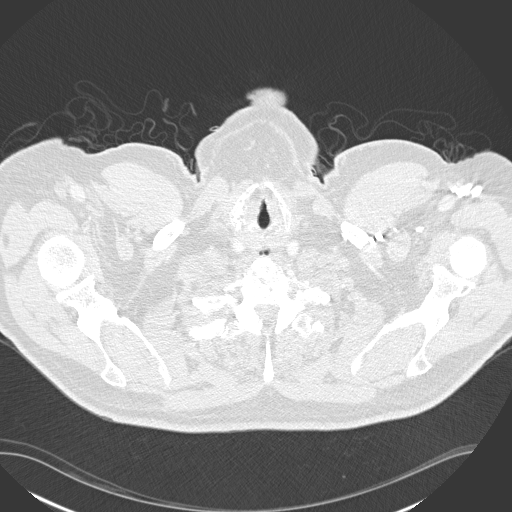

[Series 602: <mpr thick range> · coronal · 0.80mm/px · 1 of 103 slices shown]
[im 52/103  mediastinal]
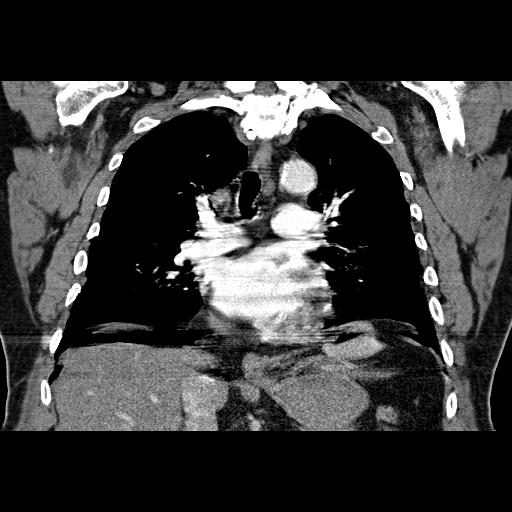

[18 of 36 positions shown; findings below may reference images not displayed]

FINDINGS: Lungs are adequately inflated with mild centrilobular emphysematous
disease. There are 4 small peripheral right lung nodules with the
largest measuring approximately 6 mm over the posterior right lower
lobe as these are all unchanged. There is no focal consolidation or
effusion. Airways are within normal.

Heart is normal in size. Mild calcified plaque over the coronary
arteries. Evidence of prior median sternotomy. No definite pulmonary
emboli are seen, although the pulmonary arterial contrast density is
not optimal as a small peripheral embolus may not be visualized on
this exam. There is minimal calcified plaque involving the thoracic
aorta. There is no mediastinal or hilar adenopathy.

Images through the upper abdomen demonstrate evidence of a prior
left nephrectomy. There is a sub cm hypodensity over the mid to
upper pole cortex of the right kidney likely a cyst but too small to
characterize as this is unchanged. There are degenerative changes of
the spine.

Review of the MIP images confirms the above findings.
IMPRESSION: No acute cardiopulmonary disease and no definite evidence of
pulmonary embolism.

Mild emphysematous disease with several small stable right lung
nodules with the largest measuring 6 mm over the right lower lobe.
Recommend followup CT in 1 year. This recommendation follows the
consensus statement: Guidelines for Management of Small Pulmonary
Nodules Detected on CT Scans: A Statement from the [employer]
[URL]

Sub cm hypodensity over the mid to upper pole right kidney unchanged
and likely cysts too small to characterize.

## 2015-05-26 IMAGING — CR DG CHEST 1V
1 series · 1 of 1 positions shown · non-contrast
Comparison: Chest radiograph May 13, 2014 and chest CT May 14, 2014

CLINICAL DATA: Difficulty breathing

EXAM:
CHEST - 1 VIEW

[w chest pa]
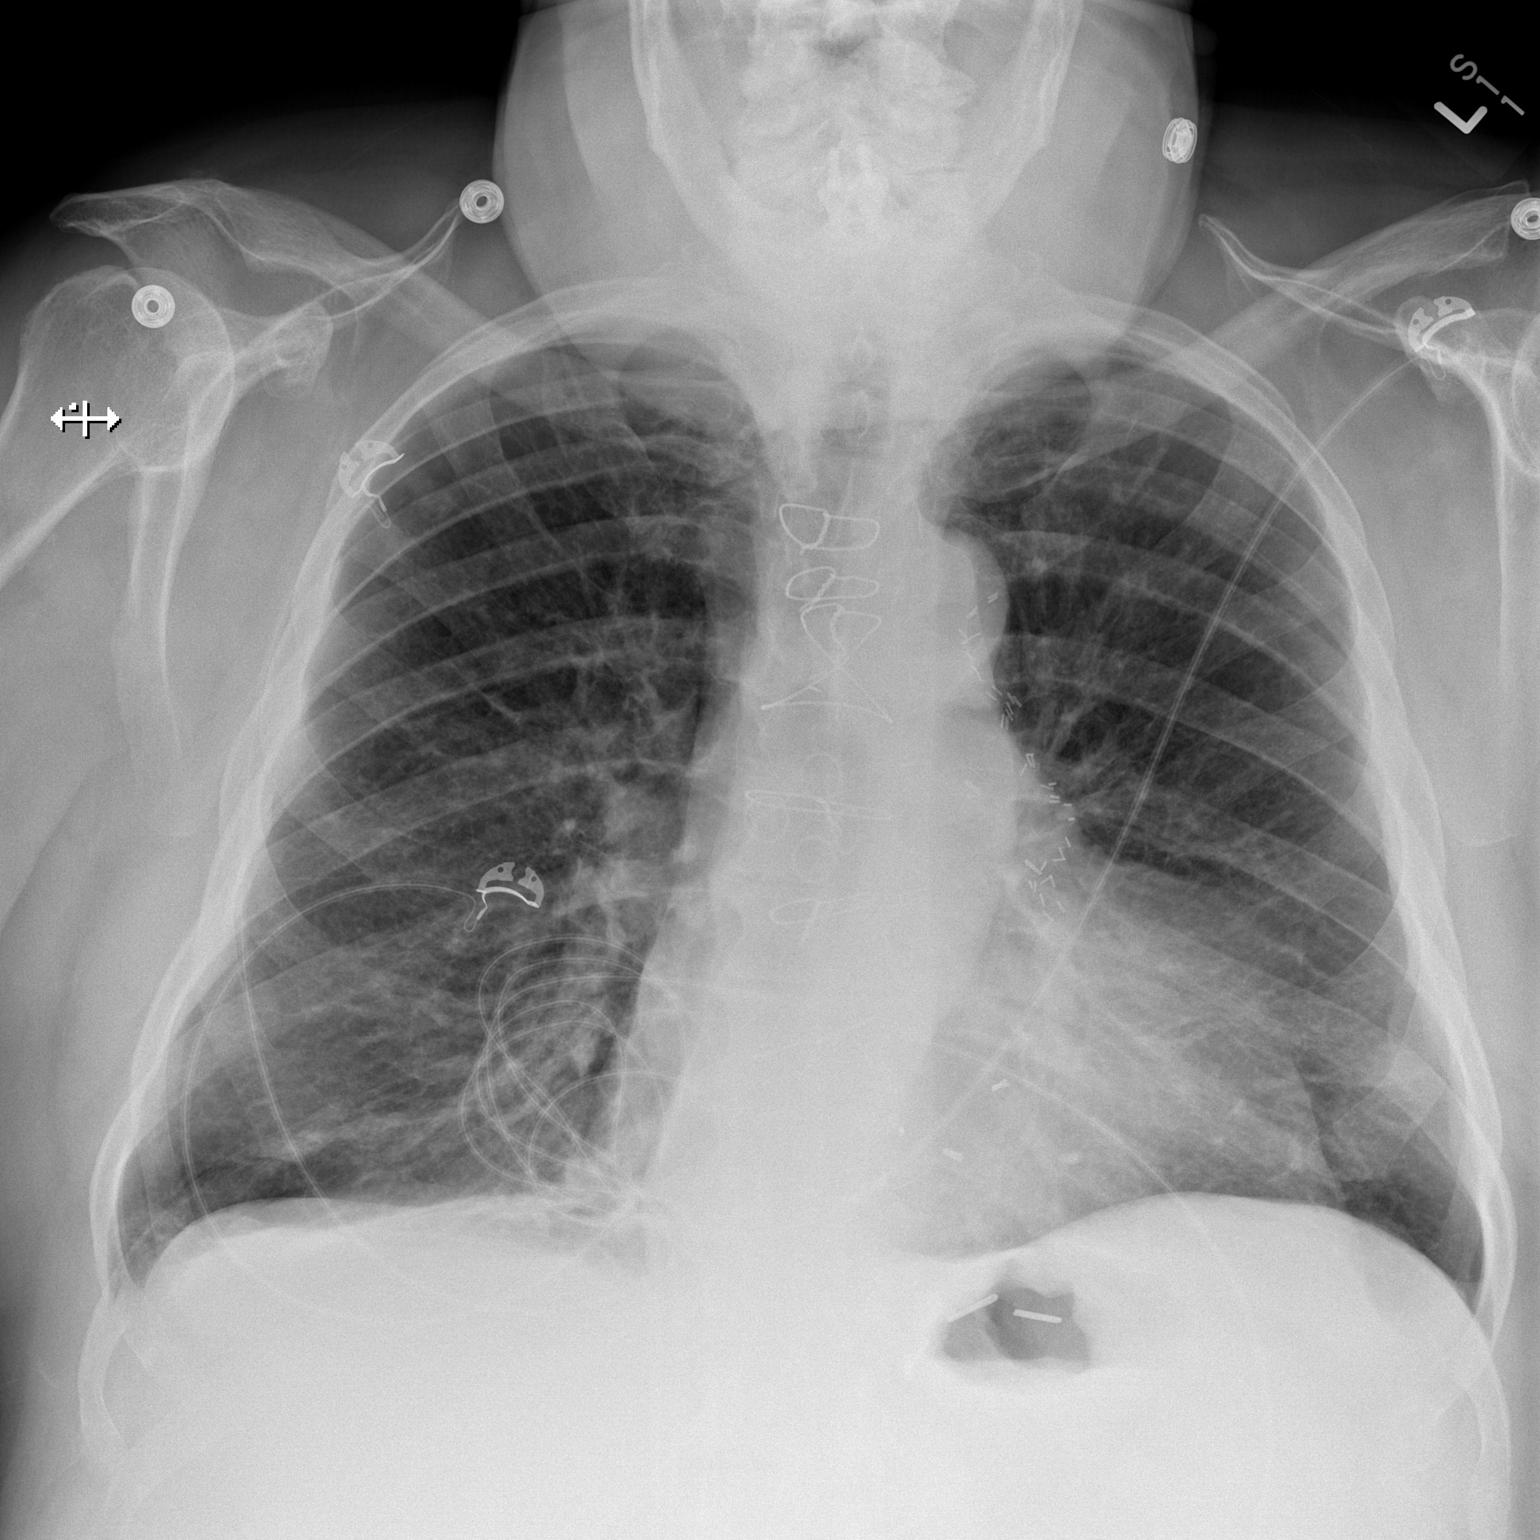

[1 of 1 positions shown; findings below may reference images not displayed]

FINDINGS: There is underlying emphysematous change. There is atelectatic
change in the right base. There is no frank edema or consolidation.
Heart is enlarged with pulmonary vascularity within normal limits.
Patient is status post coronary artery bypass grafting. No
pneumothorax. No adenopathy.
IMPRESSION: Atelectatic change right base. No edema or consolidation. Underlying
emphysema. Stable cardiac enlargement.
